# Patient Record
Sex: Male | Born: 1960 | Race: White | Hispanic: No
Health system: Southern US, Community
[De-identification: ages and names within clinical notes are randomized; demographics above are authoritative.]

## PROBLEM LIST (undated history)

## (undated) DIAGNOSIS — I10 Essential (primary) hypertension: Secondary | ICD-10-CM

## (undated) DIAGNOSIS — K219 Gastro-esophageal reflux disease without esophagitis: Secondary | ICD-10-CM

## (undated) DIAGNOSIS — F419 Anxiety disorder, unspecified: Secondary | ICD-10-CM

## (undated) DIAGNOSIS — C189 Malignant neoplasm of colon, unspecified: Secondary | ICD-10-CM

## (undated) DIAGNOSIS — F319 Bipolar disorder, unspecified: Secondary | ICD-10-CM

## (undated) DIAGNOSIS — E119 Type 2 diabetes mellitus without complications: Secondary | ICD-10-CM

## (undated) DIAGNOSIS — F32A Depression, unspecified: Secondary | ICD-10-CM

## (undated) DIAGNOSIS — G4730 Sleep apnea, unspecified: Secondary | ICD-10-CM

## (undated) DIAGNOSIS — C801 Malignant (primary) neoplasm, unspecified: Secondary | ICD-10-CM

## (undated) DIAGNOSIS — S52502A Unspecified fracture of the lower end of left radius, initial encounter for closed fracture: Secondary | ICD-10-CM

## (undated) HISTORY — DX: Essential (primary) hypertension: I10

## (undated) HISTORY — DX: Type 2 diabetes mellitus without complications: E11.9

## (undated) HISTORY — DX: Malignant neoplasm of colon, unspecified: C18.9

---

## 1998-02-24 ENCOUNTER — Emergency Department (HOSPITAL_COMMUNITY): Admission: EM | Admit: 1998-02-24 | Discharge: 1998-02-24 | Payer: Self-pay | Admitting: Emergency Medicine

## 1998-02-24 ENCOUNTER — Encounter: Payer: Self-pay | Admitting: Emergency Medicine

## 2002-03-12 ENCOUNTER — Encounter: Payer: Self-pay | Admitting: Family Medicine

## 2002-03-12 ENCOUNTER — Encounter: Admission: RE | Admit: 2002-03-12 | Discharge: 2002-03-12 | Payer: Self-pay | Admitting: Family Medicine

## 2002-03-22 HISTORY — PX: COLON SURGERY: SHX602

## 2002-04-30 ENCOUNTER — Ambulatory Visit (HOSPITAL_COMMUNITY): Admission: RE | Admit: 2002-04-30 | Discharge: 2002-04-30 | Payer: Self-pay | Admitting: *Deleted

## 2002-04-30 ENCOUNTER — Encounter: Payer: Self-pay | Admitting: *Deleted

## 2002-06-25 ENCOUNTER — Ambulatory Visit (HOSPITAL_COMMUNITY): Admission: RE | Admit: 2002-06-25 | Discharge: 2002-06-25 | Payer: Self-pay | Admitting: Gastroenterology

## 2002-06-25 ENCOUNTER — Encounter: Payer: Self-pay | Admitting: Gastroenterology

## 2002-07-02 ENCOUNTER — Ambulatory Visit (HOSPITAL_COMMUNITY): Admission: RE | Admit: 2002-07-02 | Discharge: 2002-07-02 | Payer: Self-pay | Admitting: Gastroenterology

## 2002-07-02 ENCOUNTER — Encounter: Payer: Self-pay | Admitting: Gastroenterology

## 2002-07-19 ENCOUNTER — Encounter: Payer: Self-pay | Admitting: General Surgery

## 2002-07-24 ENCOUNTER — Encounter (INDEPENDENT_AMBULATORY_CARE_PROVIDER_SITE_OTHER): Payer: Self-pay | Admitting: Specialist

## 2002-07-24 ENCOUNTER — Inpatient Hospital Stay (HOSPITAL_COMMUNITY): Admission: RE | Admit: 2002-07-24 | Discharge: 2002-07-30 | Payer: Self-pay | Admitting: General Surgery

## 2002-09-13 ENCOUNTER — Encounter: Payer: Self-pay | Admitting: Oncology

## 2002-09-13 ENCOUNTER — Ambulatory Visit (HOSPITAL_COMMUNITY): Admission: RE | Admit: 2002-09-13 | Discharge: 2002-09-13 | Payer: Self-pay | Admitting: Oncology

## 2002-09-14 ENCOUNTER — Encounter: Payer: Self-pay | Admitting: General Surgery

## 2002-09-14 ENCOUNTER — Ambulatory Visit (HOSPITAL_COMMUNITY): Admission: RE | Admit: 2002-09-14 | Discharge: 2002-09-14 | Payer: Self-pay | Admitting: General Surgery

## 2002-10-29 ENCOUNTER — Inpatient Hospital Stay (HOSPITAL_COMMUNITY): Admission: EM | Admit: 2002-10-29 | Discharge: 2002-11-03 | Payer: Self-pay | Admitting: Oncology

## 2002-10-29 ENCOUNTER — Encounter: Payer: Self-pay | Admitting: Oncology

## 2003-01-23 ENCOUNTER — Ambulatory Visit (HOSPITAL_COMMUNITY): Admission: RE | Admit: 2003-01-23 | Discharge: 2003-01-23 | Payer: Self-pay | Admitting: Oncology

## 2003-03-03 ENCOUNTER — Observation Stay (HOSPITAL_COMMUNITY): Admission: EM | Admit: 2003-03-03 | Discharge: 2003-03-03 | Payer: Self-pay | Admitting: Emergency Medicine

## 2003-05-17 ENCOUNTER — Ambulatory Visit (HOSPITAL_COMMUNITY): Admission: RE | Admit: 2003-05-17 | Discharge: 2003-05-17 | Payer: Self-pay | Admitting: Oncology

## 2003-06-04 ENCOUNTER — Ambulatory Visit (HOSPITAL_COMMUNITY): Admission: RE | Admit: 2003-06-04 | Discharge: 2003-06-04 | Payer: Self-pay | Admitting: General Surgery

## 2003-11-12 ENCOUNTER — Ambulatory Visit (HOSPITAL_COMMUNITY): Admission: RE | Admit: 2003-11-12 | Discharge: 2003-11-12 | Payer: Self-pay | Admitting: Oncology

## 2004-05-06 ENCOUNTER — Ambulatory Visit: Payer: Self-pay | Admitting: Oncology

## 2004-05-07 ENCOUNTER — Ambulatory Visit (HOSPITAL_COMMUNITY): Admission: RE | Admit: 2004-05-07 | Discharge: 2004-05-07 | Payer: Self-pay | Admitting: Oncology

## 2004-06-28 ENCOUNTER — Ambulatory Visit (HOSPITAL_COMMUNITY): Admission: RE | Admit: 2004-06-28 | Discharge: 2004-06-28 | Payer: Self-pay | Admitting: Oncology

## 2004-10-19 ENCOUNTER — Encounter: Admission: RE | Admit: 2004-10-19 | Discharge: 2004-10-19 | Payer: Self-pay | Admitting: General Surgery

## 2004-11-11 ENCOUNTER — Ambulatory Visit: Payer: Self-pay | Admitting: Oncology

## 2004-11-12 ENCOUNTER — Ambulatory Visit (HOSPITAL_COMMUNITY): Admission: RE | Admit: 2004-11-12 | Discharge: 2004-11-12 | Payer: Self-pay | Admitting: Oncology

## 2005-02-25 ENCOUNTER — Encounter: Admission: RE | Admit: 2005-02-25 | Discharge: 2005-02-25 | Payer: Self-pay | Admitting: Sports Medicine

## 2005-05-21 ENCOUNTER — Ambulatory Visit: Payer: Self-pay | Admitting: Oncology

## 2005-07-23 ENCOUNTER — Ambulatory Visit: Payer: Self-pay | Admitting: Gastroenterology

## 2005-08-03 ENCOUNTER — Encounter (INDEPENDENT_AMBULATORY_CARE_PROVIDER_SITE_OTHER): Payer: Self-pay | Admitting: Specialist

## 2005-08-03 ENCOUNTER — Ambulatory Visit: Payer: Self-pay | Admitting: Gastroenterology

## 2005-11-26 ENCOUNTER — Ambulatory Visit: Payer: Self-pay | Admitting: Oncology

## 2005-11-26 ENCOUNTER — Ambulatory Visit (HOSPITAL_COMMUNITY): Admission: RE | Admit: 2005-11-26 | Discharge: 2005-11-26 | Payer: Self-pay | Admitting: Oncology

## 2005-11-26 LAB — CBC WITH DIFFERENTIAL/PLATELET
BASO%: 0.8 % (ref 0.0–2.0)
HCT: 47.4 % (ref 38.7–49.9)
LYMPH%: 24.7 % (ref 14.0–48.0)
MCHC: 35 g/dL (ref 32.0–35.9)
MCV: 94.3 fL (ref 81.6–98.0)
MONO#: 0.4 10*3/uL (ref 0.1–0.9)
MONO%: 4.7 % (ref 0.0–13.0)
NEUT%: 66.7 % (ref 40.0–75.0)
Platelets: 188 10*3/uL (ref 145–400)
RBC: 5.02 10*6/uL (ref 4.20–5.71)

## 2005-11-26 LAB — COMPREHENSIVE METABOLIC PANEL
ALT: 31 U/L (ref 0–40)
Alkaline Phosphatase: 76 U/L (ref 39–117)
CO2: 25 mEq/L (ref 19–32)
Creatinine, Ser: 1.07 mg/dL (ref 0.40–1.50)
Glucose, Bld: 101 mg/dL — ABNORMAL HIGH (ref 70–99)
Sodium: 141 mEq/L (ref 135–145)
Total Bilirubin: 0.6 mg/dL (ref 0.3–1.2)
Total Protein: 7.3 g/dL (ref 6.0–8.3)

## 2005-11-26 LAB — LACTATE DEHYDROGENASE: LDH: 144 U/L (ref 94–250)

## 2005-11-26 LAB — CEA: CEA: 2.6 ng/mL (ref 0.0–5.0)

## 2006-05-17 ENCOUNTER — Ambulatory Visit: Payer: Self-pay | Admitting: Oncology

## 2006-06-29 ENCOUNTER — Ambulatory Visit: Payer: Self-pay | Admitting: Oncology

## 2006-07-01 ENCOUNTER — Ambulatory Visit (HOSPITAL_COMMUNITY): Admission: RE | Admit: 2006-07-01 | Discharge: 2006-07-01 | Payer: Self-pay | Admitting: Oncology

## 2006-07-01 LAB — COMPREHENSIVE METABOLIC PANEL
AST: 19 U/L (ref 0–37)
Albumin: 4.6 g/dL (ref 3.5–5.2)
BUN: 10 mg/dL (ref 6–23)
CO2: 24 mEq/L (ref 19–32)
Calcium: 9.6 mg/dL (ref 8.4–10.5)
Chloride: 105 mEq/L (ref 96–112)
Creatinine, Ser: 1.07 mg/dL (ref 0.40–1.50)
Glucose, Bld: 99 mg/dL (ref 70–99)
Potassium: 4.3 mEq/L (ref 3.5–5.3)

## 2006-07-01 LAB — CBC WITH DIFFERENTIAL/PLATELET
Basophils Absolute: 0.1 10*3/uL (ref 0.0–0.1)
Eosinophils Absolute: 0.2 10*3/uL (ref 0.0–0.5)
HCT: 46.7 % (ref 38.7–49.9)
HGB: 16.6 g/dL (ref 13.0–17.1)
MONO#: 0.4 10*3/uL (ref 0.1–0.9)
NEUT#: 4.5 10*3/uL (ref 1.5–6.5)
NEUT%: 61.8 % (ref 40.0–75.0)
RDW: 12.6 % (ref 11.2–14.6)
lymph#: 2.1 10*3/uL (ref 0.9–3.3)

## 2006-07-01 LAB — CEA: CEA: 1.9 ng/mL (ref 0.0–5.0)

## 2006-07-01 LAB — LACTATE DEHYDROGENASE: LDH: 152 U/L (ref 94–250)

## 2006-12-21 ENCOUNTER — Ambulatory Visit: Payer: Self-pay | Admitting: Oncology

## 2006-12-26 ENCOUNTER — Ambulatory Visit (HOSPITAL_COMMUNITY): Admission: RE | Admit: 2006-12-26 | Discharge: 2006-12-26 | Payer: Self-pay | Admitting: Oncology

## 2006-12-26 LAB — COMPREHENSIVE METABOLIC PANEL WITH GFR
ALT: 43 U/L (ref 0–53)
AST: 27 U/L (ref 0–37)
Albumin: 4 g/dL (ref 3.5–5.2)
Alkaline Phosphatase: 86 U/L (ref 39–117)
BUN: 8 mg/dL (ref 6–23)
CO2: 27 meq/L (ref 19–32)
Calcium: 9.5 mg/dL (ref 8.4–10.5)
Chloride: 104 meq/L (ref 96–112)
Creatinine, Ser: 0.98 mg/dL (ref 0.40–1.50)
Glucose, Bld: 94 mg/dL (ref 70–99)
Potassium: 4.2 meq/L (ref 3.5–5.3)
Sodium: 139 meq/L (ref 135–145)
Total Bilirubin: 0.6 mg/dL (ref 0.3–1.2)
Total Protein: 6.8 g/dL (ref 6.0–8.3)

## 2006-12-26 LAB — CBC WITH DIFFERENTIAL/PLATELET
BASO%: 1.1 % (ref 0.0–2.0)
Basophils Absolute: 0.1 10*3/uL (ref 0.0–0.1)
EOS%: 3.1 % (ref 0.0–7.0)
Eosinophils Absolute: 0.2 10*3/uL (ref 0.0–0.5)
HCT: 45.1 % (ref 38.7–49.9)
HGB: 16.3 g/dL (ref 13.0–17.1)
LYMPH%: 32.6 % (ref 14.0–48.0)
MCH: 34 pg — ABNORMAL HIGH (ref 28.0–33.4)
MCHC: 36.1 g/dL — ABNORMAL HIGH (ref 32.0–35.9)
MCV: 94.2 fL (ref 81.6–98.0)
MONO#: 0.5 10*3/uL (ref 0.1–0.9)
MONO%: 7.1 % (ref 0.0–13.0)
NEUT#: 3.6 10*3/uL (ref 1.5–6.5)
NEUT%: 56.1 % (ref 40.0–75.0)
Platelets: 184 10*3/uL (ref 145–400)
RBC: 4.79 10*6/uL (ref 4.20–5.71)
RDW: 12.7 % (ref 11.2–14.6)
WBC: 6.4 10*3/uL (ref 4.0–10.0)
lymph#: 2.1 10*3/uL (ref 0.9–3.3)

## 2006-12-26 LAB — LACTATE DEHYDROGENASE: LDH: 137 U/L (ref 94–250)

## 2008-04-10 ENCOUNTER — Ambulatory Visit: Payer: Self-pay | Admitting: Oncology

## 2008-06-27 ENCOUNTER — Encounter (INDEPENDENT_AMBULATORY_CARE_PROVIDER_SITE_OTHER): Payer: Self-pay | Admitting: *Deleted

## 2009-02-17 ENCOUNTER — Encounter (INDEPENDENT_AMBULATORY_CARE_PROVIDER_SITE_OTHER): Payer: Self-pay | Admitting: *Deleted

## 2009-03-22 HISTORY — PX: COLONOSCOPY: SHX174

## 2009-04-01 ENCOUNTER — Encounter (INDEPENDENT_AMBULATORY_CARE_PROVIDER_SITE_OTHER): Payer: Self-pay | Admitting: *Deleted

## 2009-04-02 ENCOUNTER — Ambulatory Visit: Payer: Self-pay | Admitting: Gastroenterology

## 2009-04-09 ENCOUNTER — Ambulatory Visit: Payer: Self-pay | Admitting: Gastroenterology

## 2009-04-10 ENCOUNTER — Encounter: Payer: Self-pay | Admitting: Gastroenterology

## 2010-03-22 DIAGNOSIS — C801 Malignant (primary) neoplasm, unspecified: Secondary | ICD-10-CM

## 2010-03-22 HISTORY — PX: COLON SURGERY: SHX602

## 2010-03-22 HISTORY — DX: Malignant (primary) neoplasm, unspecified: C80.1

## 2010-04-12 ENCOUNTER — Encounter: Payer: Self-pay | Admitting: Oncology

## 2010-04-21 NOTE — Procedures (Signed)
Summary: Colonoscopy  Patient: [name] Note: All result statuses are Final unless otherwise noted.  Tests: (1) Colonoscopy (COL)   COL Colonoscopy           DONE     [facility]     [address]     [place], [place]  [address]           COLONOSCOPY PROCEDURE REPORT           PATIENT:  Scott, Day  MR#:  [id number]     BIRTHDATE:  01-24-1961, 48 yrs. old  GENDER:  male           ENDOSCOPIST:  Judie Petit T. Russella Dar, MD, [facility]           PROCEDURE DATE:  04/09/2009     PROCEDURE:  Colonoscopy with biopsy     ASA CLASS:  Class II     INDICATIONS:  1) follow-up of cancer, T3 N0 colon adenocarcinoma,     06/2002.           MEDICATIONS:   Fentanyl 75 mcg IV, Versed 7 mg IV           DESCRIPTION OF PROCEDURE:   After the risks benefits and     alternatives of the procedure were thoroughly explained, informed     consent was obtained.  Digital rectal exam was performed and     revealed no abnormalities.   The LB PCF-H180AL [id number] endoscope     was introduced through the anus and advanced to the terminal ileum     which was intubated for a short distance, without limitations.     The quality of the prep was excellent, using MoviPrep.  The     instrument was then slowly withdrawn as the colon was fully     examined.     <<PROCEDUREIMAGES>>           FINDINGS:  Five polyps were found in the rectum. They were 2 - 3     mm in size. The polyps were removed using cold biopsy forceps.     Prior right hemicolectomy and a normal appearing anastomosis was     noted. The ascending, hepatic flexure, transverse, splenic     flexure, descending, sigmoid colon appeared unremarkable.  The     neoterminal ileum appeared normal.   Retroflexed views in the     rectum revealed internal hemorrhoids, small.  The time to cecum =     1.5  minutes. The scope was then withdrawn (time =  8  min) from     the patient and the procedure completed.           COMPLICATIONS:  None        ENDOSCOPIC IMPRESSION:     1) 2 - 3 mm, five polyps in the rectum     2) Internal hemorrhoids     3) Prior right hemicolectomy           RECOMMENDATIONS:     1) Await pathology results     2) Repeat Colonoscopy in 3 years.           Venita Lick. Russella Dar, MD, [facility]           n.     eSIGNED:   Venita Lick. Lachae Hohler at 04/09/2009 08:59 AM           Scott, Day [id number]  Note: An exclamation mark (!) indicates a result that was not dispersed into the  flowsheet. Document Creation Date: 04/09/2009 8:58 AM _______________________________________________________________________  (1) Order result status: Final Collection or observation date-time: 04/09/2009 08:54 Requested date-time:  Receipt date-time:  Reported date-time:  Referring Physician:   Ordering Physician: Claudette Head [phone]) Specimen Source:  Source: Launa Grill Order Number: [id number] Lab site:   Appended Document: Colonoscopy     Procedures Next Due Date:    Colonoscopy: 03/2012

## 2010-04-21 NOTE — Miscellaneous (Signed)
Summary: RECALL COLON 04/09/08--SP  Clinical Lists Changes  Medications: Added new medication of MOVIPREP 100 GM  SOLR (PEG-KCL-NACL-NASULF-NA ASC-C) As per prep instructions. - Signed Rx of MOVIPREP 100 GM  SOLR (PEG-KCL-NACL-NASULF-NA ASC-C) As per prep instructions.;  #1 x 0;  Signed;  Entered by: Jennye Boroughs RN;  Authorized by: Meryl Dare MD [facility];  Method used: Electronically to [facility]  [address] [phone]*, [address] [place], [place]  [address], Ph: [phone] or [phone], Fax: [phone]    Prescriptions: MOVIPREP 100 GM  SOLR (PEG-KCL-NACL-NASULF-NA ASC-C) As per prep instructions.  #1 x 0   Entered by:   Jennye Boroughs RN   Authorized by:   Meryl Dare MD [facility]   Signed by:   Jennye Boroughs RN on 04/02/2009   Method used:   [facility]ronically to        [facility]  [address] [phone]* (retail)       [address]       [place], [place]  [address]       Ph: [phone] or [phone]       Fax: [phone]   RxID:   [id number]

## 2010-04-21 NOTE — Letter (Signed)
Summary: [facility] Instructions  [facility]  [address] [place], [place] [address]   Phone: [phone]  Fax: [phone]       Scott Day    09-27-1960    MRN: [id number]        Procedure Day Dorna Bloom:  Wed. 01/19     Arrival Time:  7:30am     Procedure Time:  8:30am     Location of Procedure:                    _ X_  [facility] (4th Floor)                        PREPARATION FOR COLONOSCOPY WITH MOVIPREP   Starting 5 days prior to your procedure   Fri. 01/14  do not eat nuts, seeds, popcorn, corn, beans, peas,  salads, or any raw vegetables.  Do not take any fiber supplements (e.g. Metamucil, Citrucel, and Benefiber).  THE DAY BEFORE YOUR PROCEDURE         DATE:  01/18   DAY:  Rica Mote.  1.  Drink clear liquids the entire day-NO SOLID FOOD  2.  Do not drink anything colored red or purple.  Avoid juices with pulp.  No orange juice.  3.  Drink at least 64 oz. (8 glasses) of fluid/clear liquids during the day to prevent dehydration and help the prep work efficiently.  CLEAR LIQUIDS INCLUDE: Water Jello Ice Popsicles Tea (sugar ok, no milk/cream) Powdered fruit flavored drinks Coffee (sugar ok, no milk/cream) Gatorade Juice: apple, white grape, white cranberry  Lemonade Clear bullion, consomm, broth Carbonated beverages (any kind) Strained chicken noodle soup Hard Candy                             4.  In the morning, mix first dose of MoviPrep solution:    Empty 1 Pouch A and 1 Pouch B into the disposable container    Add lukewarm drinking water to the top line of the container. Mix to dissolve    Refrigerate (mixed solution should be used within 24 hrs)  5.  Begin drinking the prep at 5:00 p.m. The MoviPrep container is divided by 4 marks.   Every 15 minutes drink the solution down to the next mark (approximately 8 oz) until the full liter is complete.   6.  Follow completed prep with 16 oz of clear liquid of your choice (Nothing  red or purple).  Continue to drink clear liquids until bedtime.  7.  Before going to bed, mix second dose of MoviPrep solution:    Empty 1 Pouch A and 1 Pouch B into the disposable container    Add lukewarm drinking water to the top line of the container. Mix to dissolve    Refrigerate  THE DAY OF YOUR PROCEDURE      DATE:  01/19  DAY:  Wed.  Beginning at  3:30 a.m. (5 hours before procedure):         1. Every 15 minutes, drink the solution down to the next mark (approx 8 oz) until the full liter is complete.  2. Follow completed prep with 16 oz. of clear liquid of your choice.    3. You may drink clear liquids until  6:30am  (2 HOURS BEFORE PROCEDURE).   MEDICATION INSTRUCTIONS  Unless otherwise instructed, you should take regular prescription medications with a  small sip of water   as early as possible the morning of your procedure.  Diabetic patients - see separate instructions.  Stop taking Plavix or Aggrenox on  _  _  (7 days before procedure).     Stop taking Coumadin on  _ _  (5 days before procedure).  Additional medication instructions: _         OTHER INSTRUCTIONS  You will need a responsible adult at least 50 years of age to accompany you and drive you home.   This person must remain in the waiting room during your procedure.  Wear loose fitting clothing that is easily removed.  Leave jewelry and other valuables at home.  However, you may wish to bring a book to read or  an iPod/MP3 player to listen to music as you wait for your procedure to start.  Remove all body piercing jewelry and leave at home.  Total time from sign-in until discharge is approximately 2-3 hours.  You should go home directly after your procedure and rest.  You can resume normal activities the  day after your procedure.  The day of your procedure you should not:   Drive   Make legal decisions   Operate machinery   Drink alcohol   Return to work  You will receive  specific instructions about eating, activities and medications before you leave.    The above instructions have been reviewed and explained to me by   Jennye Boroughs RN  April 02, 2009 8:34 AM     I fully understand and can verbalize these instructions _____________________________ Date _________

## 2010-04-21 NOTE — Letter (Signed)
Summary: Patient Notice-Hyperplastic Polyps  [facility]  [address] [place], [place] [address]   Phone: [phone]  Fax: [phone]        April 10, 2009 MRN: [id number]    Scott Day [address] [place], [place]  [address]    Dear [name],  I am pleased to inform you that the colon polyp(s) removed during your recent colonoscopy was (were) found to be hyperplastic. These types of polyps are NOT pre-cancerous.  It is my recommendation that you have a repeat colonoscopy examination in 3 years for routine colorectal cancer screening.  Should you develop new or worsening symptoms of abdominal pain, bowel habit changes or bleeding from the rectum or bowels, please schedule an evaluation with either your primary care physician or with me.  Continue treatment plan as outlined the day of your exam.  Please call us if you are having persistent problems or have questions about your condition that have not been fully answered at this time.  Sincerely,  Meryl Dare MD [facility]  This letter has been electronically signed by your physician.  Appended Document: Patient Notice-Hyperplastic Polyps Letter mailed 1.21.11

## 2010-08-07 NOTE — Discharge Summary (Signed)
   NAMELENON, KUENNEN NO.:  [id number]   MEDICAL RECORD NO.:  [id number]                   PATIENT TYPE:  INP   LOCATION:  0367                                 FACILITY:  [facility]   PHYSICIAN:  Timothy E. Earlene Plater, M.D.              DATE OF BIRTH:  1960-05-11   DATE OF ADMISSION:  07/24/2002  DATE OF DISCHARGE:  07/30/2002                                 DISCHARGE SUMMARY   FINAL DIAGNOSES:  Adenocarcinoma right colon T3 M0.   HISTORY OF PRESENT ILLNESS:  The patient was seen and evaluated by Dr.  Corinda Gubler with a CT scan and colonoscopy.  CT scan revealed a mass in the  cecum.  Colonoscopy revealed a near obstructing adenocarcinoma.  The patient  was seen urgently, scheduled urgently, and prepared for surgery.  His  admission chest x-ray cardiogram was completely normal.  His laboratory data  showed a perfectly normal CBC, chemistry profile, CEA level 1.5.  His  surgery was carried out on May 4 and he had a smooth, completely  uncomplicated postoperative recovery.  Foley catheter removed.  Diet  started.  Ambulation was full.  GI function began and resumed normal.  He is  ready for discharge on May 10.  Wound is primary and without complications.  The patient is afebrile.  Percocet is given for pain and careful diet is  advised.  He will be seen and followed as an outpatient and oncology will be  consulted.  Final pathology report T3 M0.                                               Timothy E. Earlene Plater, M.D.    TED/MEDQ  D:  07/30/2002  T:  07/30/2002  Job:  [id number]   cc:   Ulyess Mort, M.D. [facility]

## 2010-08-07 NOTE — Op Note (Signed)
NAMEJAMALL, STROHMEIER NO.:  [id number]   MEDICAL RECORD NO.:  [id number]                   PATIENT TYPE:  INP   LOCATION:  0347                                 FACILITY:  [facility]   PHYSICIAN:  Timothy E. Earlene Plater, M.D.              DATE OF BIRTH:  15-Sep-1960   DATE OF PROCEDURE:  07/24/2002  DATE OF DISCHARGE:                                 OPERATIVE REPORT   PREOPERATIVE DIAGNOSIS:  Right colon cancer.   POSTOPERATIVE DIAGNOSIS:  Right colon cancer.   PROCEDURE:  Right colectomy.   SURGEON:  Timothy E. Earlene Plater, M.D.   ASSISTANT:  Sandria Bales. Ezzard Standing, M.D.   ANESTHESIA:  CRNA supervised, Dr. Almeta Monas.   HISTORY:  Please see the enclosed notes. Mr. Reali has been recently worked  up by Dr. Victorino Dike including CT and colonoscopy with the findings of  adenocarcinoma, a large bulky lesion in the cecum. CEA level today is 1.5,  CBC and chemistry profile were normal. The patient's been carefully informed  and is anxious to proceed with this surgery. He was prepared at home,  brought in, IV started, IV fluids given, evaluated by anesthesia, identified  and a permit signed.   DESCRIPTION OF PROCEDURE:  The patient was taken to the operating room,  placed supine, general endotracheal anesthesia administered. The abdomen had  been shaved, it was prepped and draped, a Foley catheter inserted. PAS hose  were placed. With some difficulty, an oral gastric tube was placed. A  midline incision was used in the mid abdomen. The abdomen was entered,  careful exploration revealed only a large bulky tumor in the cecum without  obvious metastases. The liver was free of disease, the small bowel and  remainder of the colon was felt to be normal. We delineated the limits of  resection being the terminal ileum and the proximal transverse colon. These  areas were carefully dissected out by dividing the peritoneum laterally with  the right colon and delivering the right colon  into the midline wound. This  was done with cautery. At the areas of forward resection, the bowel was  divided across separately with the GIA staple device and then the mesentery  of the affected bowel was then divided carefully between clamps and the  specimen removed off the field. The other structures i.e. the duodenum,  right kidney, right ureter were all kept well posterior to the dissection.  The tumor was sent to the lab fresh for their evaluation. The remaining  small bowel fit nicely, it was laid side by side against the transverse  colon and then using the GIA staple device an anastomosis was created. The  puncture sites were carefully evaluated, held together and then closed with  a TA-60 staple device. There was one small skipped area in the staple line  that was closed carefully with interrupted silks. The remainder of the  anastomosis was  carefully inspected and was intact. The mesentery was closed  with interrupted silk sutures. The new anastomosis lay nicely in the right  upper quadrant. All areas were inspected, there was no bleeding or  complications. Irrigation was clear. Blood loss was minimal. The small bowel  was allowed to lay across the mid abdomen and omentum was pulled down over  this and with the counts correct, the midline fascia was closed with the  exception of a small epigastric hernia which the entrapped fat was removed  and  then the fascia was closed in the normal routine way. The subcu was  copiously irrigated and skin closed with wide skin staples. Final counts  correct. The patient tolerated the procedure well and was awakened and taken  to the recovery room in good condition.                                               Timothy E. Earlene Plater, M.D.    TED/MEDQ  D:  07/24/2002  T:  07/24/2002  Job:  [id number]   cc:   Ulyess Mort, M.D. [facility]

## 2010-08-07 NOTE — Op Note (Signed)
[name], [name]                          ACCOUNT NO.:  [id number]   MEDICAL RECORD NO.:  [id number]                   PATIENT TYPE:  AMB   LOCATION:  DAY                                  FACILITY:  [facility]   PHYSICIAN:  Timothy E. Earlene Plater, M.D.              DATE OF BIRTH:  Apr 23, 1960   DATE OF PROCEDURE:  09/14/2002  DATE OF DISCHARGE:                                 OPERATIVE REPORT   PREOPERATIVE DIAGNOSIS:  Cancer of the colon for chemotherapy.   POSTOPERATIVE DIAGNOSIS:  Cancer of the colon for chemotherapy.   PROCEDURE:  Insertion of Port-A-Cath.   SURGEON:  Timothy E. Earlene Plater, M.D.   ANESTHESIA:  Local.   INDICATION:  [name] is a very healthy 50 year old with a recent  diagnosis of T3 N0 carcinoma of the right colon and has elected to take  adjuvant chemotherapy.  For this, he will need a Port-A-Cath.  He agrees and  understands.   DESCRIPTION OF PROCEDURE:  The patient taken to the operating room, placed  supine, carefully positioned, arms tucked, mid back elevated, in the head-  down position.  The entire upper chest and neck were prepped with Betadine.  We had hoped to approach the left side as preferential.  IV sedation given,  local anesthesia used throughout.  The left subclavian vein was isolated on  the second pass without complication.  The wire placed through the  introduction needle and throughout using real-time fluoroscopy.  The wire  was ascertained to be in the correct position, the insertion needle removed,  and then the introducer placed over the guidewire, ascertained to be in the  correct position, guidewire removed, and then the irrigated catheter of the  Port-A-Cath device was placed through the introducer and the introducer  removed.  Careful positioning of the Port-A-Cath was satisfactory.  And then  using additional local, a pocket was made in the upper left chest for the  port device.  The catheter was passed subcutaneously, again  ascertained to  be in good position; it irrigated and flushed readily.  It was connected, of  course, and locked in position to the port.  The port was then sewn to the  prepectoral fascia with three sutures of 2-0 Prolene.  And the wound was  closed in layers with 3-0 Monocryl.  The skin was carefully marked, Steri-  Strips applied, and a Tegaderm applied and then the access needle flushed  with concentrated heparin and correctly placed  into the port device.  Again, it flushed and irrigated well.  When this  procedure was complete, counts were correct.  He had tolerated it well and  was removed to the recovery room.  A portable chest x-ray will be made for  completeness, and he will be followed as an outpatient.  Timothy E. Earlene Plater, M.D.    TED/MEDQ  D:  09/14/2002  T:  09/14/2002  Job:  [id number]   cc:   Genene Churn. Cyndie Chime, M.D.  [address] [facility]  [place]  [place] [address]  Fax: [phone]   Ulyess Mort, M.D. [facility]

## 2010-08-07 NOTE — Discharge Summary (Signed)
NAMESUNG, Scott Day NO.:  [id number]   MEDICAL RECORD NO.:  [id number]                   PATIENT TYPE:  INP   LOCATION:  1610                                 FACILITY:  [facility]   PHYSICIAN:  Scott Day, M.D.                   DATE OF BIRTH:  06/08/1960   DATE OF ADMISSION:  10/29/2002  DATE OF DISCHARGE:  11/03/2002                                 DISCHARGE SUMMARY   DISCHARGE DIAGNOSES:  1. Stage II colon cancer on adjuvant chemotherapy.  2. History of admission for enteritis.   HISTORY OF PRESENT ILLNESS:  [name] is a 50 year old gentleman with a  known history of T3, N0 colon cancer who is receiving FOL/FOX chemotherapy,  status post four cycles.  The patient was admitted with right lower quadrant  pain and diarrhea.   PHYSICAL EXAMINATION:  GENERAL APPEARANCE:  On initial evaluation, the blood  pressure was 122/77, temperature 97.2 degrees, pulse 78, and respiratory  rate 20.  WEIGHT 197 pounds.  LUNGS:  Clear.  CARDIAC:  Unremarkable.  ABDOMEN:  He had right lower quadrant pain.   LABORATORY DATA:  The white count initially was 1.68, ANC 400, hemoglobin  12.4, and platelet count 91,000.   HOSPITAL COURSE:  The patient was admitted to the hospital.  He was started  on IV fluids and was started on Neupogen 40 mcg subcutaneously daily and had  a CT of the abdomen on October 29, 2002.  This essentially showed some  regional inflammation around the anastomotic site.  No evidence of  obstruction was seen.  He did have pain in the hospital and was started on  antibiotics on October 31, 2002.  He did not have any fevers.  The white  count remained stable.  The white count improved.  On November 01, 2002, the  white count had risen to 3.4 with an ANC of 1000.  Eventually the pain had  resolved.  He had no other complications while in the hospital.  On November 03, 2002, his white count was 21 with a hemoglobin of 12.6 and a platelet  count of  184.  He was felt to be ready for discharge.   DISPOSITION:  He was discharged in stable condition.   DISCHARGE MEDICATIONS:  1. Imodium one to two p.r.n. with loose bowel movements.  2. Valium 5 mg p.r.n.  3. Ambien at h.s. 10 mg.  4. Coumadin 1 mg a day.    FOLLOWUP:  He was asked to call our office to reschedule his chemotherapy in  a week's time.   PROGNOSIS:  Good.                                               Scott Day,  M.D.    PR/MEDQ  D:  11/03/2002  T:  11/03/2002  Job:  [id number]   cc:   Scott Day, M.D.  [address] [place]  [place] [address]  Fax: [phone]

## 2010-08-07 NOTE — H&P (Signed)
Scott Day, Scott Day                          ACCOUNT NO.:  [id number]   MEDICAL RECORD NO.:  [id number]                   PATIENT TYPE:  INP   LOCATION:  0450                                 FACILITY:  [facility]   PHYSICIAN:  Lennis P. Darrold Span, M.D.             DATE OF BIRTH:  06/14/1960   DATE OF ADMISSION:  03/02/2003  DATE OF DISCHARGE:  03/03/2003                                HISTORY & PHYSICAL   REASON FOR ADMISSION:  The patient is a 50 year old white male patient of  Pierce Crane, M.D., and also followed by Sheppard Plumber. Earlene Plater, M.D. 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  His  last chemotherapy was at [facility] on December 7th, with more  nausea and difficulty with that treatment than previously which he believes  was due to some initial error in the rate of the infusion which was  corrected.  He did receive Neulasta at our office on December 9th.  His  bowels had moved normally on December 10th.  He was having some vague mid  abdominal discomfort which is not unusual for him following chemotherapy.  This for several days after the treatment last week and he took several  doses of Maalox.  He denies any preexisting epigastric discomfort or reflux  type symptoms and does not have any history of known gastritis or peptic  ulcer disease.  He is on, I believe, low-dose Coumadin for the Port-A-Cath  though this is not listed on his admission meds.  Since arrival at the  hospital about 8 p.m. to the emergency room on December 11th, he has not had  further bowel movements.   REVIEW OF SYSTEMS:  No fever or other localizing symptoms of infection.  He  is hungry this morning since he has been NPO.  No other noted bleeding.  His  nausea has been controlled with medications since his  chemotherapy.   PAST MEDICAL HISTORY:  No known allergies per the last H&P.  Post right  hemicolectomy by Sheppard Plumber. Earlene Plater, M.D. in May 2004.  A Port-A-Cath in  place.   FAMILY HISTORY:  Per the old records.   SOCIAL HISTORY:  Married.  Lives with his wife in [place].  He continues  to smoke 1-1/2 packs daily which we have discussed.  He tells me he is  interested in stopping smoking.  Per the old records, past alcohol abuse.  He does not have a primary M.D.   PHYSICAL EXAMINATION:  GENERAL:  He is awake, alert, and looks comfortable  lying supine in bed, he is asking for a sausage biscuit and to smoke.  His  wife is present and very supportive and there is a child here also.  VITAL SIGNS:  Temperature 97.2, heart rate 75 and regular, respirations not  labored, blood pressure 113/70.  Room air saturation 95%.  HEENT:  His mouth is clear and moist.  CHEST:  The port is effused without difficulty and the site is not  remarkable.  LUNGS:  Without wheezes or rales.  ABDOMEN:  Soft, nontender.  A few bowel sounds present.  Not distended.  EXTREMITIES:  Lower extremity no edema or cords.  NEUROLOGICAL:  Nonfocal.  SKIN:  Without rashes.   LABORATORY DATA:  Include a CBC done December 11th at 2230.  Hemoglobin  14.7, white count 33.9, platelets 149 and from 0600 December 12th hemoglobin  13.7, white count 29.5 and platelets 95,000.  Sodium 140, potassium 3.4,  chloride 112, CO2 28, glucose 79, BUN 15, creatinine 0.9.   He is not had x-rays since coming in through the emergency room.  Last CT  abdomen and pelvis was January 23, 2003.   IMPRESSION AND PLAN:  1. Black diarrhea three episodes prior to admission.  He clinically seems     to be better.  Etiology not entirely clear.  We will continue Nexium, IV     fluids, supplemental potassium, increase p.o.'s as tolerated, will heme     check any stool and will check his prothrombin time and repeat CBC in the     morning.  2. Colon  carcinoma on adjuvant FOLFOX chemotherapy.  3. Tobacco abuse and we will use nicotine patches in the hospital and     hopefully we will continue to encourage him to discontinue this.  4. Port-A-Cath in place.  5. Elevated white count appears secondary to his Neulasta.  No obvious     infection.  6. Thrombocytopenia this morning probably related to his chemotherapy and     will follow.                                               Lennis P. Darrold Span, M.D.    LPL/MEDQ  D:  03/03/2003  T:  03/03/2003  Job:  [id number]   cc:   Pierce Crane, M.D.  [address] [facility]  [place]  [place] [address]  Fax: [phone]   Sheppard Plumber. Earlene Plater, M.D.  [address]  [place]  [place] [address]  Fax: [phone]   Ulyess Mort, M.D. [facility]

## 2010-08-07 NOTE — H&P (Signed)
NAME:  HOBSON, LAX NO.:  [id number]   MEDICAL RECORD NO.:  [id number]                   PATIENT TYPE:  INP   LOCATION:  6578                                 FACILITY:  [place]   PHYSICIAN:  Pierce Crane, M.D.                   DATE OF BIRTH:  June 21, 1960   DATE OF ADMISSION:  10/29/2002  DATE OF DISCHARGE:                                HISTORY & PHYSICAL   HISTORY OF PRESENT ILLNESS:  Mr. Feagans is a 50 year old [place]  gentleman who is being admitted to [facility] today after he  presented to our office tentatively to initiate day one cycle IV full Fox IV  regimen for colon carcinoma.  He was noted to have had acute onset of right  mid to lower quadrant pain approximately five days ago.  He states that he  has also had fairly persistent diarrheal stools since his last treatment;  though they have not been frequent, they have been approximately two per  day.  He denied any significant change of bowel color (i.e. specifically  hematochezia or melenic stools).  He denies any nausea.  He is currently  recovering from fairly significant oral mucositis.  He wonders whether this  could have been secondary to his 5-FU bolus pushed on October 17, 2002.  No  fevers, chills, or night sweats.  No shortness of breath or chest pain  noted.  He denies any diffuse bone pain.  He has had about a 14 pound weight  loss over the past two weeks.  Energy level is definitely an issue.  He has  been working full time at [employer], and admits that he is probably  lifting more than 40 pounds on an intermittent basis.   The patient is being admitted today for IV fluid hydration, assessment of  abdominal pain including acute abdomen series, full liquid diet, and to  initiate Neupogen for afebrile neutropenia.   PAST MEDICAL HISTORY:  History of tobacco use and remote history of alcohol  abuse.  Otherwise benign.   PAST SURGICAL HISTORY:  Status post  Infusaport placement, right  hemicolectomy, and appendectomy.   ALLERGIES:  No known drug allergies.   CURRENT MEDICATIONS:  1. Chemotherapy agents via full Fox IV regimen.  2. Compazine 10 mg p.o. q.6-8h. p.r.n. nausea.  3. Ambien 10 mg one p.o. q.6h. p.r.n. sleep.  4. Imodium AD p.r.n.   SOCIAL HISTORY:  Mr. Nordling and his wife reside in [place], [place].  Again, he works at [employer] in [employer].  He  has been trying to work full time.  History of tobacco use, smoking  approximately two packs of cigarettes per day, though he is actively trying  to decrease.  Remote history of alcohol abuse.   FAMILY HISTORY:  Noncontributory.   REVIEW OF SYSTEMS:  As per HPI.  The patient denies  any recent headaches,  but he has appreciated a little bit of a visual change, which has been slow,  and since initiation of his first cycle of full Fox given on September 17, 2002.  GI symptoms are per HPI.  He denies any new musculoskeletal problems.  No  genitourinary or neurologic problems.   PHYSICAL EXAMINATION:  VITAL SIGNS:  Blood pressure 122/77, pulse 78,  respirations 20, temperature 97.2, weight 197 pounds (14 pounds weight loss  from October 15, 2002).  GENERAL:  A well-developed, well-nourished, actually fairly health-  appearing, middle-aged white gentleman in no acute distress, but he does  appear quite fatigued.  HEENT:  Conjunctivae and sclerae are a bit injected.  Sclerae are anicteric.  Oropharynx shows approximately a grade I mucositis at this time.  No frank  evidence of oral candidiasis.  No cervical or supraclavicular  lymphadenopathy.  LUNGS:  Clear to auscultation.  No wheezing or rhonchi.  HEART:  Regular rate and rhythm without murmurs, rubs, gallops, or clicks.  CHEST:  Anterior chest wall Infusaport site is benign without significant  erythema or tenderness.  ABDOMEN:  Soft in the upper quadrants.  There is tenderness though in the  right mid to  lower quadrant lateral to the umbilicus.  Normal bowel sounds.  EXTREMITIES:  Benign.   HGB 12.4 gm, PLT 91,000, WBC 1600, with an ANC of 400.   IMPRESSION / RECOMMENDATIONS:  1. Abdominal pain/diarrhea.  The patient is admitted to [facility]     today for assessment of this process.  We would be concerned about     possible GI irritation from his recent chemotherapy, though we need to     rule out an obstructive process.  An acute abdomen serial will be     obtained.  He will be supported with IV fluids and a full liquid diet.  2. Afebrile neutropenia.  The patient will not be started on prophylactic     antibiotics, but we will start Neupogen 480 mcg subcu daily, with     standing orders for temperature greater than 100.0, to obtain stat blood     cultures x2, one peripheral, one through port, and stat urinalysis with     covering M.D. being contacted.  3. Colon carcinoma.  He was due to initiate day one cycle IV full Fox IV.     Obviously, this has been delayed.  It can be resumed upon hospital     discharge.     Amada Kingfisher, P.A.                Pierce Crane, M.D.    CTS/MEDQ  D:  10/29/2002  T:  10/29/2002  Job:  [id number]

## 2010-08-07 NOTE — Op Note (Signed)
NAME:  Scott Day, Scott Day                          ACCOUNT NO.:  [id number]   MEDICAL RECORD NO.:  [id number]                   PATIENT TYPE:  AMB   LOCATION:  DAY                                  [facility]ACILITY[facility]  [facility]   PHYSICIAN:  Timothy E. Earlene Plater, M.D.              DATE OF BIRTH:  September 17, 1960   DATE OF PROCEDURE:  06/04/2003  DATE OF DISCHARGE:                                 OPERATIVE REPORT   PREOPERATIVE DIAGNOSES:  Colon cancer chemotherapy.   POSTOPERATIVE DIAGNOSES:  Colon cancer chemotherapy.   PROCEDURE:  Removal of Port-A-Cath.   SURGEON:  Timothy E. Earlene Plater, M.D.   ANESTHESIA:  Local standby.   Mr. Irion underwent right colectomy for carcinoma T3N0 lesion, elected to  have chemotherapy, this was completed and Port-A-Cath is now to be removed.  He agrees and understands.   The patient was seen, evaluated, identified and the permit signed.   He was taken to the operating room, placed supine, IV sedation given. The  left upper chest was prepped and draped in the usual fashion, 025% Marcaine  with epinephrine was used for local anesthesia. The old incision was used,  the skin and subcutaneous space was entered, Port-A-Cath identified,  grasped, sutures cut and the Port-A-Cath and catheter removed intact. No  bleeding or complications.  Wound closed in layers with 3-0 Monocryl.  Tolerated it well, counts correct, Steri-Strips and dry sterile dressing  applied.   The patient removed to the recovery room in good condition. Instructions  given, will be seen in routine followup.                                               Timothy E. Earlene Plater, M.D.    TED/MEDQ  D:  06/04/2003  T:  06/04/2003  Job:  [id number]   cc:   Pierce Crane, M.D.  [address] - [facility]  [place]  [place] [address]  Fax: [phone]

## 2012-02-21 ENCOUNTER — Encounter: Payer: Self-pay | Admitting: Gastroenterology

## 2012-02-22 ENCOUNTER — Encounter: Payer: Self-pay | Admitting: Gastroenterology

## 2012-11-13 ENCOUNTER — Encounter: Payer: Self-pay | Admitting: Gastroenterology

## 2012-11-16 ENCOUNTER — Encounter: Payer: Self-pay | Admitting: Gastroenterology

## 2013-04-09 ENCOUNTER — Ambulatory Visit: Payer: Self-pay | Admitting: Family Medicine

## 2013-04-18 ENCOUNTER — Ambulatory Visit: Payer: Self-pay | Admitting: Family Medicine

## 2013-05-02 ENCOUNTER — Ambulatory Visit: Payer: Self-pay | Admitting: Family Medicine

## 2013-05-14 ENCOUNTER — Ambulatory Visit: Payer: Self-pay | Admitting: Family Medicine

## 2014-10-16 ENCOUNTER — Encounter: Payer: Self-pay | Admitting: Gastroenterology

## 2017-09-15 ENCOUNTER — Emergency Department (HOSPITAL_COMMUNITY): Payer: Self-pay

## 2017-09-15 ENCOUNTER — Emergency Department (HOSPITAL_COMMUNITY)
Admission: EM | Admit: 2017-09-15 | Discharge: 2017-09-15 | Disposition: A | Discharge status: Home / Self Care | Payer: Self-pay | Attending: Emergency Medicine | Admitting: Emergency Medicine

## 2017-09-15 ENCOUNTER — Other Ambulatory Visit: Payer: Self-pay

## 2017-09-15 ENCOUNTER — Encounter (HOSPITAL_COMMUNITY): Payer: Self-pay | Admitting: Emergency Medicine

## 2017-09-15 DIAGNOSIS — S22000A Wedge compression fracture of unspecified thoracic vertebra, initial encounter for closed fracture: Secondary | ICD-10-CM

## 2017-09-15 DIAGNOSIS — S52502A Unspecified fracture of the lower end of left radius, initial encounter for closed fracture: Secondary | ICD-10-CM | POA: Insufficient documentation

## 2017-09-15 DIAGNOSIS — Y929 Unspecified place or not applicable: Secondary | ICD-10-CM | POA: Insufficient documentation

## 2017-09-15 DIAGNOSIS — S32010A Wedge compression fracture of first lumbar vertebra, initial encounter for closed fracture: Secondary | ICD-10-CM | POA: Insufficient documentation

## 2017-09-15 DIAGNOSIS — S5290XA Unspecified fracture of unspecified forearm, initial encounter for closed fracture: Secondary | ICD-10-CM

## 2017-09-15 DIAGNOSIS — S52572A Other intraarticular fracture of lower end of left radius, initial encounter for closed fracture: Secondary | ICD-10-CM

## 2017-09-15 DIAGNOSIS — S22080A Wedge compression fracture of T11-T12 vertebra, initial encounter for closed fracture: Secondary | ICD-10-CM | POA: Insufficient documentation

## 2017-09-15 DIAGNOSIS — S52612A Displaced fracture of left ulna styloid process, initial encounter for closed fracture: Secondary | ICD-10-CM | POA: Insufficient documentation

## 2017-09-15 DIAGNOSIS — Y939 Activity, unspecified: Secondary | ICD-10-CM | POA: Insufficient documentation

## 2017-09-15 DIAGNOSIS — W132XXA Fall from, out of or through roof, initial encounter: Secondary | ICD-10-CM | POA: Insufficient documentation

## 2017-09-15 DIAGNOSIS — R109 Unspecified abdominal pain: Secondary | ICD-10-CM | POA: Insufficient documentation

## 2017-09-15 DIAGNOSIS — Y999 Unspecified external cause status: Secondary | ICD-10-CM | POA: Insufficient documentation

## 2017-09-15 LAB — BASIC METABOLIC PANEL
Anion gap: 9 (ref 5–15)
BUN: 13 mg/dL (ref 6–20)
CO2: 26 mmol/L (ref 22–32)
Calcium: 9.5 mg/dL (ref 8.9–10.3)
Chloride: 102 mmol/L (ref 98–111)
Creatinine, Ser: 1.38 mg/dL — ABNORMAL HIGH (ref 0.61–1.24)
GFR calc Af Amer: >60 mL/min (ref >60)
GFR calc non Af Amer: 55 mL/min — ABNORMAL LOW (ref >60)
Glucose, Bld: 266 mg/dL — ABNORMAL HIGH (ref 70–99)
Potassium: 4.2 mmol/L (ref 3.5–5.1)
Sodium: 137 mmol/L (ref 135–145)

## 2017-09-15 LAB — CBC WITH DIFFERENTIAL/PLATELET
Basophils Absolute: 0 10*3/uL (ref 0.0–0.1)
Basophils Relative: 0 %
Eosinophils Absolute: 0.2 10*3/uL (ref 0.0–0.7)
Eosinophils Relative: 1 %
HCT: 43.6 % (ref 39.0–52.0)
Hemoglobin: 15.3 g/dL (ref 13.0–17.0)
Lymphocytes Relative: 15 %
Lymphs Abs: 1.7 10*3/uL (ref 0.7–4.0)
MCH: 32.8 pg (ref 26.0–34.0)
MCHC: 35.1 g/dL (ref 30.0–36.0)
MCV: 93.4 fL (ref 78.0–100.0)
Monocytes Absolute: 0.7 10*3/uL (ref 0.1–1.0)
Monocytes Relative: 6 %
Neutro Abs: 8.8 10*3/uL — ABNORMAL HIGH (ref 1.7–7.7)
Neutrophils Relative %: 78 %
Platelets: 201 10*3/uL (ref 150–400)
RBC: 4.67 MIL/uL (ref 4.22–5.81)
RDW: 12.7 % (ref 11.5–15.5)
WBC: 11.4 10*3/uL — ABNORMAL HIGH (ref 4.0–10.5)

## 2017-09-15 MED ORDER — KETOROLAC TROMETHAMINE 30 MG/ML IJ SOLN
15.0000 mg | Freq: Once | INTRAMUSCULAR | Status: AC
  Administered 2017-09-15: 15 mg via INTRAVENOUS
  Filled 2017-09-15: qty 1

## 2017-09-15 MED ORDER — OXYCODONE-ACETAMINOPHEN 5-325 MG PO TABS: 1.0000 | ORAL_TABLET | ORAL | 0 refills | Status: DC | PRN

## 2017-09-15 MED ORDER — HYDROMORPHONE HCL 1 MG/ML IJ SOLN
1.0000 mg | Freq: Once | INTRAMUSCULAR | Status: AC
  Administered 2017-09-15: 1 mg via INTRAVENOUS
  Filled 2017-09-15: qty 1

## 2017-09-15 MED ORDER — BUPIVACAINE HCL (PF) 0.25 % IJ SOLN
10.0000 mL | Freq: Once | INTRAMUSCULAR | Status: AC
  Administered 2017-09-15: 5 mL
  Filled 2017-09-15: qty 30

## 2017-09-15 MED ORDER — SODIUM CHLORIDE 0.9 % IV BOLUS
1000.0000 mL | Freq: Once | INTRAVENOUS | Status: AC
  Administered 2017-09-15: 1000 mL via INTRAVENOUS

## 2017-09-15 MED ORDER — IOPAMIDOL (ISOVUE-300) INJECTION 61%
100.0000 mL | Freq: Once | INTRAVENOUS | Status: AC | PRN
  Administered 2017-09-15: 100 mL via INTRAVENOUS

## 2017-09-15 NOTE — ED Triage Notes (Signed)
Pt was on a roof, slip and fell off a roof landing on his back.  Pt c/o of back, neck and left wrist pain. Swelling noted to left wrist.  C collar placed.

## 2017-09-15 NOTE — ED Provider Notes (Signed)
[facility] EMERGENCY DEPARTMENT Provider Note   CSN: [id number] Arrival date & time: 09/15/17  1851     History   Chief Complaint Chief Complaint  Patient presents with  . Fall    HPI Scott Day is a 57 y.o. male.  HPI   16yM s/p fall from roof. Happened just before arrival. Estimate he was 10 feet up. Fell in almost a seated position with hands down. Severe lower back and L wrist pain. No numbness or tingling. No blood thinners. Doesn't think hit head. No HA or neck pain.   History reviewed. No pertinent past medical history.  There are no active problems to display for this patient.   History reviewed. No pertinent surgical history.      Home Medications    Prior to Admission medications   Not on File    Family History No family history on file.  Social History Social History   Tobacco Use  . Smoking status: Never Smoker  . Smokeless tobacco: Never Used  Substance Use Topics  . Alcohol use: Never    Frequency: Never  . Drug use: Never     Allergies   Patient has no allergy information on record.   Review of Systems Review of Systems  All systems reviewed and negative, other than as noted in HPI.  Physical Exam Updated Vital Signs BP (!) 147/110 (BP Location: Right Arm)   Pulse 74   Temp 97.6 F (36.4 C) (Temporal)   Resp (!) 26   Ht 6' (1.829 m)   Wt 112.9 kg (249 lb)   SpO2 100%   BMI 33.77 kg/m   Physical Exam  Constitutional: He appears well-developed and well-nourished. No distress.  HENT:  Head: Normocephalic and atraumatic.  Eyes: Conjunctivae are normal. Right eye exhibits no discharge. Left eye exhibits no discharge.  Neck: Neck supple.  Cardiovascular: Normal rate, regular rhythm and normal heart sounds. Exam reveals no gallop and no friction rub.  No murmur heard. Pulmonary/Chest: Effort normal and breath sounds normal. No respiratory distress.  Abdominal: Soft. He exhibits no distension. There is no tenderness.    Musculoskeletal:  Tenderness in the lumbar spine in the midline and paraspinally.  No step-off or deformities.  He is neurovascular intact in his lower extremities but complains of increased back pain with movement of his legs.  Tenderness and deformity to left wrist.  Neurovascularly intact.  Closed injury.  No midline cervical spine tenderness.  Neurological: He is alert.  Skin: Skin is warm and dry.  Psychiatric: He has a normal mood and affect. His behavior is normal. Thought content normal.  Nursing note and vitals reviewed.    ED Treatments / Results  Labs (all labs ordered are listed, but only abnormal results are displayed) Labs Reviewed  BASIC METABOLIC PANEL - Abnormal; Notable for the following components:      Result Value   Glucose, Bld 266 (*)    Creatinine, Ser 1.38 (*)    GFR calc non Af Amer 55 (*)    All other components within normal limits  CBC WITH DIFFERENTIAL/PLATELET - Abnormal; Notable for the following components:   WBC 11.4 (*)    Neutro Abs 8.8 (*)    All other components within normal limits    EKG None  Radiology Dg Wrist Complete Left  Result Date: 09/15/2017 CLINICAL DATA:  Fall with left wrist pain EXAM: LEFT WRIST - COMPLETE 3+ VIEW COMPARISON:  None. FINDINGS: Comminuted intra-articular left distal radius fracture  with impaction and mild apex volar angulation, without significant displacement. Minimally displaced left ulnar styloid fracture with 3 mm radial displacement of the distal fracture fragment. No dislocation. No suspicious focal osseous lesion. No significant arthropathy. Prominent left wrist soft tissue swelling. No radiopaque foreign body. IMPRESSION: 1. Comminuted intra-articular impacted mildly angulated left distal radius fracture. 2. Minimally displaced left ulnar styloid fracture. Electronically Signed   By: Ilona Sorrel M.D.   On: 09/15/2017 20:01   Ct Abdomen Pelvis W Contrast  Result Date: 09/15/2017 CLINICAL DATA:  Abdominal  trauma.  Fall from roof. EXAM: CT ABDOMEN AND PELVIS WITH CONTRAST TECHNIQUE: Multidetector CT imaging of the abdomen and pelvis was performed using the standard protocol following bolus administration of intravenous contrast. CONTRAST:  175mL ISOVUE-300 IOPAMIDOL (ISOVUE-300) INJECTION 61% COMPARISON:  CT abdomen pelvis 12/26/2006 FINDINGS: LOWER CHEST: No basilar pulmonary nodules or pleural effusion. No apical pericardial effusion. HEPATOBILIARY: Normal hepatic contours and density. No intra- or extrahepatic biliary dilatation. Normal gallbladder. PANCREAS: Normal parenchymal contours without ductal dilatation. No peripancreatic fluid collection. SPLEEN: Normal. ADRENALS/URINARY TRACT: --Adrenal glands: Normal. --Right kidney/ureter: No hydronephrosis, nephroureterolithiasis, perinephric stranding or solid renal mass. --Left kidney/ureter: No hydronephrosis, nephroureterolithiasis, perinephric stranding or solid renal mass. --Urinary bladder: Normal for degree of distention STOMACH/BOWEL: --Stomach/Duodenum: No hiatal hernia or other gastric abnormality. Normal duodenal course. --Small bowel: No dilatation or inflammation. --Colon: No focal abnormality. --Appendix: Surgically absent. VASCULAR/LYMPHATIC: Atherosclerotic calcification is present within the non-aneurysmal abdominal aorta, without hemodynamically significant stenosis. The portal vein, splenic vein, superior mesenteric vein and IVC are patent. No abdominal or pelvic lymphadenopathy. REPRODUCTIVE: Normal prostate and seminal vesicles. MUSCULOSKELETAL. There are acute compression fractures of T12 and L1 with less than 25% height loss. The fractures involve the anterior walls and superior endplates. No posterior wall or neural arch involvement. Normal alignment is preserved. OTHER: None. IMPRESSION: 1. Acute compression fractures of T12 and L1 with slightly less than 25% height loss. The fractures traverse the superior endplates and anterior walls. 2. No  traumatic visceral injury. 3.  Aortic Atherosclerosis (ICD10-I70.0). These results were called by telephone at the time of interpretation on 09/15/2017 at 9:19 pm to Dr. Virgel Manifold , who verbally acknowledged these results. Electronically Signed   By: Ulyses Jarred M.D.   On: 09/15/2017 21:19    Procedures Procedures (including critical care time)  Hematoma Block: Consent: verbal consent obtained Indication: Pain Relief, Orthopedic Manipulation Medications: 5cc of 0.25% bupivacaine. Skin overlying hematoma cleansed in typical fashion. 25g needle used. Hematoma entered over dorsal aspect of wrist and directed towards fracture site. Dark red blood aspirated prior to injecting. Pt tolerated procedure well w/o any apparent complication.  Reduction of fracture/dislocation Date/Time: 9:34 PM Performed by: Virgel Manifold Authorized by: Virgel Manifold Consent: Verbal consent obtained. Risks and benefits: risks, benefits and alternatives were discussed Consent given by: patient Required items: required blood products, implants, devices, and special equipment available Time out: Immediately prior to procedure a "time out" was called to verify the correct patient, procedure, equipment, support staff and site/side marked as required.  Patient sedated: no, hematoma block  Vitals: Vital signs were monitored during sedation. Patient tolerance: Patient tolerated the procedure well with no immediate complications. Joint: L wrist/radius Reduction technique: finger traps   Medications Ordered in ED Medications  sodium chloride 0.9 % bolus 1,000 mL (0 mLs Intravenous Stopped 09/15/17 2108)  HYDROmorphone (DILAUDID) injection 1 mg (1 mg Intravenous Given 09/15/17 1932)  bupivacaine (PF) (MARCAINE) 0.25 % injection 10 mL (5 mLs Infiltration  Given 09/15/17 2115)  iopamidol (ISOVUE-300) 61 % injection 100 mL (100 mLs Intravenous Contrast Given 09/15/17 2044)  HYDROmorphone (DILAUDID) injection 1 mg (1 mg  Intravenous Given 09/15/17 2112)     Initial Impression / Assessment and Plan / ED Course  I have reviewed the triage vital signs and the nursing notes.  Pertinent labs & imaging results that were available during my care of the patient were reviewed by me and considered in my medical decision making (see chart for details).     57yM with fall from roof. Doesn't think hit head. No HA or neck pain. Closed L radius and ulnar styloid fx. NVI. Tried to reduce after hematoma block but unfortunately doesn't look much better. Hand surgery follow-up for definitive repair. T12/L1 compression fxs. Stable injuries. Nonfocal neuro exam. PRN pain meds. NS FU.   Final Clinical Impressions(s) / ED Diagnoses   Final diagnoses:  Other closed intra-articular fracture of distal end of left radius, initial encounter  Closed compression fracture of first lumbar vertebra, initial encounter ([place])  Compression fracture of body of thoracic vertebra ([facility])  Closed displaced fracture of styloid process of left ulna, initial encounter    ED Discharge Orders    None       Virgel Manifold, MD 09/19/17 [phone]

## 2017-09-15 NOTE — Discharge Instructions (Addendum)
The first three physicians arehand surgeons. Dr Vertell Limber is a neurosurgeon. Call tomorrow to make appointments. Take pain medication as needed. Take 600 mg of ibuprofen every 6 hours as needed with it.

## 2017-09-16 ENCOUNTER — Encounter (HOSPITAL_BASED_OUTPATIENT_CLINIC_OR_DEPARTMENT_OTHER): Payer: Self-pay | Admitting: *Deleted

## 2017-09-16 ENCOUNTER — Other Ambulatory Visit: Payer: Self-pay

## 2017-09-19 ENCOUNTER — Other Ambulatory Visit: Payer: Self-pay | Admitting: Orthopedic Surgery

## 2017-09-20 ENCOUNTER — Ambulatory Visit (HOSPITAL_BASED_OUTPATIENT_CLINIC_OR_DEPARTMENT_OTHER)
Admission: RE | Admit: 2017-09-20 | Discharge: 2017-09-20 | Disposition: A | Discharge status: Home / Self Care | Payer: Self-pay | Source: Ambulatory Visit | Attending: Orthopedic Surgery | Admitting: Orthopedic Surgery

## 2017-09-20 ENCOUNTER — Encounter (HOSPITAL_BASED_OUTPATIENT_CLINIC_OR_DEPARTMENT_OTHER): Payer: Self-pay | Admitting: *Deleted

## 2017-09-20 ENCOUNTER — Other Ambulatory Visit: Payer: Self-pay

## 2017-09-20 ENCOUNTER — Encounter (HOSPITAL_BASED_OUTPATIENT_CLINIC_OR_DEPARTMENT_OTHER)
Admission: RE | Disposition: A | Discharge status: Home / Self Care | Payer: Self-pay | Source: Ambulatory Visit | Attending: Orthopedic Surgery

## 2017-09-20 ENCOUNTER — Ambulatory Visit (HOSPITAL_BASED_OUTPATIENT_CLINIC_OR_DEPARTMENT_OTHER): Payer: Self-pay | Admitting: Anesthesiology

## 2017-09-20 DIAGNOSIS — S52572A Other intraarticular fracture of lower end of left radius, initial encounter for closed fracture: Secondary | ICD-10-CM | POA: Insufficient documentation

## 2017-09-20 DIAGNOSIS — Z79899 Other long term (current) drug therapy: Secondary | ICD-10-CM | POA: Insufficient documentation

## 2017-09-20 DIAGNOSIS — Z87891 Personal history of nicotine dependence: Secondary | ICD-10-CM | POA: Insufficient documentation

## 2017-09-20 DIAGNOSIS — Y92009 Unspecified place in unspecified non-institutional (private) residence as the place of occurrence of the external cause: Secondary | ICD-10-CM | POA: Insufficient documentation

## 2017-09-20 DIAGNOSIS — W1789XA Other fall from one level to another, initial encounter: Secondary | ICD-10-CM | POA: Insufficient documentation

## 2017-09-20 DIAGNOSIS — K219 Gastro-esophageal reflux disease without esophagitis: Secondary | ICD-10-CM | POA: Insufficient documentation

## 2017-09-20 HISTORY — PX: OPEN REDUCTION INTERNAL FIXATION (ORIF) DISTAL RADIAL FRACTURE: SHX5989

## 2017-09-20 HISTORY — DX: Gastro-esophageal reflux disease without esophagitis: K21.9

## 2017-09-20 HISTORY — DX: Unspecified fracture of the lower end of left radius, initial encounter for closed fracture: S52.502A

## 2017-09-20 HISTORY — DX: Malignant (primary) neoplasm, unspecified: C80.1

## 2017-09-20 SURGERY — OPEN REDUCTION INTERNAL FIXATION (ORIF) DISTAL RADIUS FRACTURE
Anesthesia: Regional | Site: Wrist | Laterality: Left

## 2017-09-20 MED ORDER — CLONIDINE HCL (ANALGESIA) 100 MCG/ML EP SOLN
EPIDURAL | Status: DC | PRN
  Administered 2017-09-20: 50 ug

## 2017-09-20 MED ORDER — DEXAMETHASONE SODIUM PHOSPHATE 10 MG/ML IJ SOLN
INTRAMUSCULAR | Status: AC
Start: 2017-09-20 — End: ?
  Filled 2017-09-20: qty 1

## 2017-09-20 MED ORDER — ONDANSETRON HCL 4 MG/2ML IJ SOLN
INTRAMUSCULAR | Status: AC
  Filled 2017-09-20: qty 2

## 2017-09-20 MED ORDER — MIDAZOLAM HCL 2 MG/2ML IJ SOLN
1.0000 mg | INTRAMUSCULAR | Status: DC | PRN
  Administered 2017-09-20 (×2): 2 mg via INTRAVENOUS

## 2017-09-20 MED ORDER — FENTANYL CITRATE (PF) 100 MCG/2ML IJ SOLN
INTRAMUSCULAR | Status: AC
  Filled 2017-09-20: qty 2

## 2017-09-20 MED ORDER — SCOPOLAMINE 1 MG/3DAYS TD PT72: 1.0000 | MEDICATED_PATCH | Freq: Once | TRANSDERMAL | Status: DC | PRN

## 2017-09-20 MED ORDER — PROPOFOL 500 MG/50ML IV EMUL
INTRAVENOUS | Status: DC | PRN
  Administered 2017-09-20: 150 ug/kg/min via INTRAVENOUS
  Administered 2017-09-20: 100 ug/kg/min via INTRAVENOUS

## 2017-09-20 MED ORDER — CEFAZOLIN SODIUM-DEXTROSE 2-4 GM/100ML-% IV SOLN
2.0000 g | INTRAVENOUS | Status: AC
  Administered 2017-09-20: 2 g via INTRAVENOUS

## 2017-09-20 MED ORDER — HYDROCODONE-ACETAMINOPHEN 5-325 MG PO TABS: ORAL_TABLET | ORAL | 0 refills | Status: DC

## 2017-09-20 MED ORDER — LIDOCAINE HCL (CARDIAC) PF 100 MG/5ML IV SOSY
PREFILLED_SYRINGE | INTRAVENOUS | Status: AC
  Filled 2017-09-20: qty 5

## 2017-09-20 MED ORDER — FENTANYL CITRATE (PF) 100 MCG/2ML IJ SOLN
50.0000 ug | INTRAMUSCULAR | Status: DC | PRN
  Administered 2017-09-20: 100 ug via INTRAVENOUS

## 2017-09-20 MED ORDER — MIDAZOLAM HCL 2 MG/2ML IJ SOLN
INTRAMUSCULAR | Status: AC
  Filled 2017-09-20: qty 2

## 2017-09-20 MED ORDER — LACTATED RINGERS IV SOLN
INTRAVENOUS | Status: DC
  Administered 2017-09-20: 12:00:00 via INTRAVENOUS

## 2017-09-20 MED ORDER — CEFAZOLIN SODIUM-DEXTROSE 2-4 GM/100ML-% IV SOLN
INTRAVENOUS | Status: AC
  Filled 2017-09-20: qty 100

## 2017-09-20 MED ORDER — ROPIVACAINE HCL 7.5 MG/ML IJ SOLN
INTRAMUSCULAR | Status: DC | PRN
  Administered 2017-09-20: 30 mL via PERINEURAL

## 2017-09-20 MED ORDER — LIDOCAINE HCL (CARDIAC) PF 100 MG/5ML IV SOSY
PREFILLED_SYRINGE | INTRAVENOUS | Status: DC | PRN
  Administered 2017-09-20: 50 mg via INTRAVENOUS

## 2017-09-20 MED ORDER — CHLORHEXIDINE GLUCONATE 4 % EX LIQD: 60.0000 mL | Freq: Once | CUTANEOUS | Status: DC

## 2017-09-20 SURGICAL SUPPLY — 55 items
BANDAGE ACE 3X5.8 VEL STRL LF (GAUZE/BANDAGES/DRESSINGS) ×2 IMPLANT
BIT DRILL 2.0 LNG QUCK RELEASE (BIT) ×1 IMPLANT
BIT DRILL 2.8X5 QR DISP (BIT) ×2 IMPLANT
BLADE SURG 15 STRL LF DISP TIS (BLADE) ×2 IMPLANT
BLADE SURG 15 STRL SS (BLADE) ×2
BNDG ESMARK 4X9 LF (GAUZE/BANDAGES/DRESSINGS) ×2 IMPLANT
BNDG GAUZE ELAST 4 BULKY (GAUZE/BANDAGES/DRESSINGS) ×2 IMPLANT
BNDG PLASTER X FAST 3X3 WHT LF (CAST SUPPLIES) ×20 IMPLANT
CHLORAPREP W/TINT 26ML (MISCELLANEOUS) ×2 IMPLANT
CORD BIPOLAR FORCEPS 12FT (ELECTRODE) ×2 IMPLANT
COVER BACK TABLE 60X90IN (DRAPES) ×2 IMPLANT
COVER MAYO STAND STRL (DRAPES) ×2 IMPLANT
CUFF TOURNIQUET SINGLE 18IN (TOURNIQUET CUFF) IMPLANT
CUFF TOURNIQUET SINGLE 24IN (TOURNIQUET CUFF) IMPLANT
DRAPE EXTREMITY T 121X128X90 (DRAPE) ×2 IMPLANT
DRAPE OEC MINIVIEW 54X84 (DRAPES) ×2 IMPLANT
DRAPE SURG 17X23 STRL (DRAPES) ×2 IMPLANT
DRILL 2.0 LNG QUICK RELEASE (BIT) ×2
GAUZE SPONGE 4X4 12PLY STRL (GAUZE/BANDAGES/DRESSINGS) ×2 IMPLANT
GAUZE XEROFORM 1X8 LF (GAUZE/BANDAGES/DRESSINGS) ×2 IMPLANT
GLOVE BIO SURGEON STRL SZ7.5 (GLOVE) ×2 IMPLANT
GLOVE BIOGEL PI IND STRL 8 (GLOVE) ×1 IMPLANT
GLOVE BIOGEL PI IND STRL 8.5 (GLOVE) IMPLANT
GLOVE BIOGEL PI INDICATOR 8 (GLOVE) ×1
GLOVE BIOGEL PI INDICATOR 8.5 (GLOVE)
GLOVE SURG ORTHO 8.0 STRL STRW (GLOVE) IMPLANT
GOWN STRL REUS W/ TWL LRG LVL3 (GOWN DISPOSABLE) ×1 IMPLANT
GOWN STRL REUS W/TWL LRG LVL3 (GOWN DISPOSABLE) ×1
GOWN STRL REUS W/TWL XL LVL3 (GOWN DISPOSABLE) ×2 IMPLANT
GUIDEWIRE ORTHO 0.054X6 (WIRE) ×6 IMPLANT
NEEDLE HYPO 25X1 1.5 SAFETY (NEEDLE) IMPLANT
NS IRRIG 1000ML POUR BTL (IV SOLUTION) ×2 IMPLANT
PACK BASIN DAY SURGERY FS (CUSTOM PROCEDURE TRAY) ×2 IMPLANT
PAD CAST 3X4 CTTN HI CHSV (CAST SUPPLIES) ×1 IMPLANT
PADDING CAST COTTON 3X4 STRL (CAST SUPPLIES) ×1
PLATE DISTAL RADIUS L WIDE (Plate) ×2 IMPLANT
SCREW BONE CORT 3.5X17MM HEXA (Screw) ×1 IMPLANT
SCREW CORT FT 18X2.3XLCK HEX (Screw) ×1 IMPLANT
SCREW CORT FT 22X2.3XLCK HEX (Screw) ×1 IMPLANT
SCREW CORTICAL 3.5X17 (Screw) ×2 IMPLANT
SCREW CORTICAL LOCKING 2.3X18M (Screw) ×1 IMPLANT
SCREW CORTICAL LOCKING 2.3X22M (Screw) ×1 IMPLANT
SCREW CORTICAL LOCKING 2.3X24M (Screw) ×5 IMPLANT
SCREW FX24X2.3XLCK SMTH NS CRT (Screw) ×5 IMPLANT
SCREW HEXALOBE NON-LOCK 3.5X16 (Screw) ×6 IMPLANT
SLEEVE SCD COMPRESS KNEE MED (MISCELLANEOUS) IMPLANT
SLING ARM FOAM STRAP LRG (SOFTGOODS) ×2 IMPLANT
STOCKINETTE 4X48 STRL (DRAPES) ×2 IMPLANT
SUT ETHILON 4 0 PS 2 18 (SUTURE) ×2 IMPLANT
SUT VICRYL 4-0 PS2 18IN ABS (SUTURE) ×2 IMPLANT
SYR BULB 3OZ (MISCELLANEOUS) ×2 IMPLANT
SYR CONTROL 10ML LL (SYRINGE) IMPLANT
TOWEL GREEN STERILE FF (TOWEL DISPOSABLE) ×4 IMPLANT
TOWEL OR NON WOVEN STRL DISP B (DISPOSABLE) ×2 IMPLANT
UNDERPAD 30X30 (UNDERPADS AND DIAPERS) ×2 IMPLANT

## 2017-09-20 NOTE — Progress Notes (Signed)
Assisted Dr. Germeroth with left, ultrasound guided, axillary block. Side rails up, monitors on throughout procedure. See vital signs in flow sheet. Tolerated Procedure well. 

## 2017-09-20 NOTE — Transfer of Care (Signed)
Immediate Anesthesia Transfer of Care Note  Patient: Scott Day  Procedure(s) Performed: OPEN REDUCTION INTERNAL FIXATION (ORIF) DISTAL RADIAL FRACTURE (Left Wrist)  Patient Location: PACU  Anesthesia Type:MAC  Level of Consciousness: awake  Airway & Oxygen Therapy: Patient Spontanous Breathing and Patient connected to face mask oxygen  Post-op Assessment: Report given to RN and Post -op Vital signs reviewed and stable  Post vital signs: Reviewed and stable  Last Vitals:  Vitals Value Taken Time  BP    Temp    Pulse 78 09/20/2017  2:49 PM  Resp 8 09/20/2017  2:49 PM  SpO2 100 % 09/20/2017  2:49 PM  Vitals shown include unvalidated device data.  Last Pain:  Vitals:   09/20/17 1223  TempSrc: Oral  PainSc: 9       Patients Stated Pain Goal: 4 (16/10/96 0454)  Complications: No apparent anesthesia complications

## 2017-09-20 NOTE — Anesthesia Procedure Notes (Signed)
Anesthesia Regional Block: Axillary brachial plexus block   Pre-Anesthetic Checklist: ,, timeout performed, Correct Patient, Correct Site, Correct Laterality, Correct Procedure, Correct Position, site marked, Risks and benefits discussed,  Surgical consent,  Pre-op evaluation,  At surgeon's request and post-op pain management  Laterality: Left  Prep: chloraprep       Needles:  Injection technique: Single-shot  Needle Type: Stimulator Needle - 40     Needle Length: 4cm  Needle Gauge: 22     Additional Needles:   Procedures:,,,, ultrasound used (permanent image in chart),,,,  Narrative:  Start time: 09/20/2017 12:43 PM End time: 09/20/2017 12:48 PM Injection made incrementally with aspirations every 5 mL. Anesthesiologist: Nolon Nations, MD  Additional Notes: BP cuff, EKG monitors applied. Sedation begun. Nerve location verified with U/S. Anesthetic injected incrementally, slowly , and after neg aspirations under direct u/s guidance. Good perineural spread. Tolerated well.

## 2017-09-20 NOTE — Anesthesia Preprocedure Evaluation (Signed)
Anesthesia Evaluation  Patient identified by MRN, date of birth, ID band Patient awake    Reviewed: Allergy & Precautions, NPO status , Patient's Chart, lab work & pertinent test results  Airway Mallampati: II  TM Distance: >3 FB Neck ROM: Full    Dental no notable dental hx.    Pulmonary neg pulmonary ROS, former smoker,    Pulmonary exam normal breath sounds clear to auscultation       Cardiovascular negative cardio ROS Normal cardiovascular exam Rhythm:Regular Rate:Normal     Neuro/Psych negative neurological ROS  negative psych ROS   GI/Hepatic Neg liver ROS, GERD  ,  Endo/Other  negative endocrine ROS  Renal/GU negative Renal ROS     Musculoskeletal negative musculoskeletal ROS (+)   Abdominal   Peds  Hematology negative hematology ROS (+)   Anesthesia Other Findings   Reproductive/Obstetrics                             Anesthesia Physical Anesthesia Plan  ASA: II  Anesthesia Plan: Regional   Post-op Pain Management:    Induction:   PONV Risk Score and Plan: 1 and Ondansetron and Propofol infusion  Airway Management Planned: Natural Airway  Additional Equipment:   Intra-op Plan:   Post-operative Plan:   Informed Consent: I have reviewed the patients History and Physical, chart, labs and discussed the procedure including the risks, benefits and alternatives for the proposed anesthesia with the patient or authorized representative who has indicated his/her understanding and acceptance.   Dental advisory given  Plan Discussed with: CRNA  Anesthesia Plan Comments:         Anesthesia Quick Evaluation

## 2017-09-20 NOTE — Op Note (Addendum)
09/20/2017 [facility]  Operative Note  Pre Op Diagnosis: Left comminuted intraarticular distal radius fracture  Post Op Diagnosis: Left comminuted intraarticular distal radius fracture  Procedure:  1. ORIF Left comminuted intraarticular distal radius fracture, 3 intra-articular fragments 2. Left brachioradialis release  Surgeon: Leanora Cover, MD  Assistant: none Daryll Brod, MD  Anesthesia: General with regional  Fluids: Per anesthesia flow sheet  EBL: minimal  Complications: None  Specimen: None  Tourniquet Time:  Total Tourniquet Time Documented: Upper Arm (Left) - 54 minutes Total: Upper Arm (Left) - 54 minutes   Disposition: Stable to PACU  INDICATIONS:  Scott Day is a 57 y.o. male states he fell from a roof 5 days ago injuring his left wrist.  He was seen in the emergency department where radiograph were taken revealing a distal radius fracture.  He was splinted and followed up in the office. We discussed nonoperative and operative treatment options.  He wished to proceed with operative fixation.  Risks, benefits, and alternatives of surgery were discussed including the risk of blood loss; infection; damage to nerves, vessels, tendons, ligaments, bone; failure of surgery; need for additional surgery; complications with wound healing; continued pain; nonunion; malunion; stiffness.  We also discussed the possible need for bone graft and the benefits and risks including the possibility of disease transmission.  He voiced understanding of these risks and elected to proceed.    OPERATIVE COURSE:  After being identified preoperatively by myself, the patient and I agreed upon the procedure and site of procedure.  Surgical site was marked.  The risks, benefits and alternatives of the surgery were reviewed and he wished to proceed.  Surgical consent had been signed.  He was given IV Ancef as preoperative antibiotic prophylaxis.  He was transferred to the operating  room and placed on the operating room table in supine position with the Left upper extremity on an armboard. General anesthesia was induced by the anesthesiologist.  A regional block had been performed by anesthesia in preoperative holding.  The Left upper extremity was prepped and draped in normal sterile orthopedic fashion.  A surgical pause was performed between the surgeons, anesthesia and operating room staff, and all were in agreement as to the patient, procedure and site of procedure.  Tourniquet at the proximal aspect of the extremity was inflated to 250 mmHg after exsanguination of the limb with an Esmarch bandage.  Standard volar Mallie Mussel approach was used.  The bipolar electrocautery was used to obtain hemostasis.  The superficial and deep portions of the FCR tendon sheath were incised, and the FCR and FPL were swept ulnarly to protect the palmar cutaneous branch of the median nerve.  The brachioradialis was released at the radial side of the radius.  The pronator quadratus was released and elevated with the periosteal elevator.  The fracture site was identified and cleared of soft tissue interposition and hematoma.  It was reduced under direct visualization.  There is intra-articular extension creating 3 or more intra-articular fragments.   An AcuMed volar distal radial locking plate was selected.  It was secured to the bone with the guidepins.  C-arm was used in AP and lateral projections to ensure appropriate reduction and position of the hardware and adjustments made as necessary.  Standard AO drilling and measuring technique was used.  A single screw was placed in the slotted hole in the shaft of the plate.  The distal holes were filled with locking pegs with the exception of the styloid  holes, which were filled with locking screws.  The remaining holes in the shaft of the plate were filled with nonlocking screws.  Good purchase was obtained.  C-arm was used in AP, lateral and oblique projections to  ensure appropriate reduction and position of hardware, which was the case.  There was no intra-articular penetration of hardware.  The wound was copiously irrigated with sterile saline.  Pronator quadratus was repaired back over top of the plate using 4-0 Vicryl suture.  Vicryl suture was placed in the subcutaneous tissues in an inverted interrupted fashion and the skin was closed with 4-0 nylon in a horizontal mattress fashion.  There was good pronation and supination of the wrist without crepitance.  The wound was then dressed with sterile Xeroform, 4x4s, and wrapped with a Kerlix bandage.  A volar splint was placed and wrapped with Kerlix and Ace bandage.  Tourniquet was deflated at 54 minutes.  Fingertips were pink with brisk capillary refill after deflation of the tourniquet.  Operative drapes were broken down.  The patient was awoken from anesthesia safely.  He was transferred back to the stretcher and taken to the PACU in stable condition.  I will see him back in the office in one week for postoperative followup.  I will give him a prescription for Norco 5/325 1-2 tabs PO q6 hours prn pain, dispense # 30.    Tennis Must, MD Electronically signed, 09/20/17

## 2017-09-20 NOTE — H&P (Signed)
  Scott Day is an 57 y.o. male.   Chief Complaint: Left distal radius fracture HPI: 57 year old right-hand-dominant male states he fell from a roof 5 days ago injuring his left wrist.  He was seen in the emergency department where radiograph were taken revealing distal radius fracture.  He was splinted and followed up in the office.  He wishes to proceed with operative fixation.  Allergies: No Known Allergies  Past Medical History:  Diagnosis Date  . Cancer [facility]) 2012   colon cancer  . Closed fracture of left distal radius   . GERD (gastroesophageal reflux disease)     Past Surgical History:  Procedure Laterality Date  . COLON SURGERY  2012   colon cancer    Family History: History reviewed. No pertinent family history.  Social History:   reports that he has quit smoking. He has never used smokeless tobacco. He reports that he drinks alcohol. He reports that he does not use drugs.  Medications: Medications Prior to Admission  Medication Sig Dispense Refill  . calcium carbonate (TUMS - DOSED IN MG ELEMENTAL CALCIUM) 500 MG chewable tablet Chew 1 tablet by mouth daily.    Marland Kitchen ibuprofen (ADVIL,MOTRIN) 400 MG tablet Take 800 mg by mouth every 6 (six) hours as needed.     Marland Kitchen oxyCODONE-acetaminophen (PERCOCET/ROXICET) 5-325 MG tablet Take 1-2 tablets by mouth every 4 (four) hours as needed. 20 tablet 0    No results found for this or any previous visit (from the past 48 hour(s)).  No results found.   A comprehensive review of systems was negative.  Height 6' (1.829 m), weight 113.4 kg (250 lb).  General appearance: alert, cooperative and appears stated age Head: Normocephalic, without obvious abnormality, atraumatic Neck: supple, symmetrical, trachea midline Cardio: regular rate and rhythm Resp: clear to auscultation bilaterally Extremities: Intact sensation and capillary refill all digits.  +epl/fpl/io.  No wounds. Pulses: 2+ and symmetric Skin: Skin color, texture,  turgor normal. No rashes or lesions Neurologic: Grossly normal Incision/Wound: None  Assessment/Plan Left distal radius fracture.  Non operative and operative treatment options were discussed with the patient and patient wishes to proceed with operative treatment. Risks, benefits, and alternatives of surgery were discussed and the patient agrees with the plan of care.   Scott Day R 09/20/2017, 12:26 PM

## 2017-09-20 NOTE — Discharge Instructions (Addendum)

## 2017-09-20 NOTE — Anesthesia Procedure Notes (Signed)
Procedure Name: MAC Date/Time: 09/20/2017 1:50 PM Performed by: Lieutenant Diego, CRNA Pre-anesthesia Checklist: Patient identified, Timeout performed, Emergency Drugs available, Suction available and Patient being monitored Patient Re-evaluated:Patient Re-evaluated prior to induction Oxygen Delivery Method: Simple face mask Preoxygenation: Pre-oxygenation with 100% oxygen Induction Type: IV induction

## 2017-09-20 NOTE — Op Note (Signed)
I assisted Surgeon(s) and Role:    * Leanora Cover, MD - Primary    Daryll Brod, MD - Assisting on the Procedure(s): OPEN REDUCTION INTERNAL FIXATION (ORIF) DISTAL RADIAL FRACTURE on 09/20/2017.  I provided assistance on this case as follows: setup, approach, debridement,reduction,stabilization and fixation of the fracture,closure of the wound and application of the dressings and splint.  Electronically signed by: Wynonia Sours, MD Date: 09/20/2017 Time: 2:48 PM

## 2017-09-21 ENCOUNTER — Encounter (HOSPITAL_BASED_OUTPATIENT_CLINIC_OR_DEPARTMENT_OTHER): Payer: Self-pay | Admitting: Orthopedic Surgery

## 2017-09-21 NOTE — Anesthesia Postprocedure Evaluation (Signed)
Anesthesia Post Note  Patient: Scott Day  Procedure(s) Performed: OPEN REDUCTION INTERNAL FIXATION (ORIF) DISTAL RADIAL FRACTURE (Left Wrist)     Patient location during evaluation: PACU Anesthesia Type: Regional Level of consciousness: awake and alert Pain management: pain level controlled Vital Signs Assessment: post-procedure vital signs reviewed and stable Respiratory status: spontaneous breathing Cardiovascular status: stable Anesthetic complications: no    Last Vitals:  Vitals:   09/20/17 1515 09/20/17 1551  BP: 109/71 (!) 167/78  Pulse: 62 66  Resp: 20   Temp:  36.6 C  SpO2: 100% 96%    Last Pain:  Vitals:   09/20/17 1551  TempSrc: Oral  PainSc: 0-No pain                 Nolon Nations

## 2017-09-21 NOTE — Op Note (Signed)
Intra-operative fluoroscopic images in the AP, lateral, and oblique views were taken and evaluated by myself.  Reduction and hardware placement were confirmed.  There was no intraarticular penetration of permanent hardware.  

## 2019-05-21 DIAGNOSIS — I639 Cerebral infarction, unspecified: Secondary | ICD-10-CM

## 2019-05-21 HISTORY — DX: Cerebral infarction, unspecified: I63.9

## 2019-06-04 ENCOUNTER — Emergency Department: Payer: Medicaid Other

## 2019-06-04 ENCOUNTER — Inpatient Hospital Stay (HOSPITAL_COMMUNITY)
Admission: EM | Admit: 2019-06-04 | Discharge: 2019-06-09 | DRG: 024 | Disposition: A | Discharge status: Skilled Nursing Facility | Payer: Medicaid Other | Attending: Neurology | Admitting: Neurology

## 2019-06-04 ENCOUNTER — Inpatient Hospital Stay (HOSPITAL_COMMUNITY): Payer: Medicaid Other

## 2019-06-04 ENCOUNTER — Inpatient Hospital Stay (HOSPITAL_COMMUNITY): Payer: Medicaid Other | Admitting: Certified Registered"

## 2019-06-04 ENCOUNTER — Other Ambulatory Visit: Payer: Self-pay

## 2019-06-04 ENCOUNTER — Encounter (HOSPITAL_COMMUNITY): Payer: Self-pay | Admitting: Certified Registered"

## 2019-06-04 ENCOUNTER — Other Ambulatory Visit (HOSPITAL_COMMUNITY): Payer: Self-pay | Admitting: Neurology

## 2019-06-04 ENCOUNTER — Inpatient Hospital Stay (HOSPITAL_COMMUNITY)
Admission: RE | Admit: 2019-06-04 | Discharge: 2019-06-04 | Disposition: A | Discharge status: Home / Self Care | Payer: Medicaid Other | Source: Ambulatory Visit | Attending: Neurology | Admitting: Neurology

## 2019-06-04 ENCOUNTER — Emergency Department
Admission: EM | Admit: 2019-06-04 | Discharge: 2019-06-04 | Disposition: A | Discharge status: Other Acute Inpatient Hospital | Payer: Medicaid Other | Attending: Emergency Medicine | Admitting: Emergency Medicine

## 2019-06-04 ENCOUNTER — Encounter (HOSPITAL_COMMUNITY)
Admission: EM | Disposition: A | Discharge status: Skilled Nursing Facility | Payer: Self-pay | Source: Ambulatory Visit | Attending: Neurology

## 2019-06-04 DIAGNOSIS — E6609 Other obesity due to excess calories: Secondary | ICD-10-CM

## 2019-06-04 DIAGNOSIS — I639 Cerebral infarction, unspecified: Secondary | ICD-10-CM | POA: Diagnosis not present

## 2019-06-04 DIAGNOSIS — R471 Dysarthria and anarthria: Secondary | ICD-10-CM | POA: Diagnosis present

## 2019-06-04 DIAGNOSIS — B9620 Unspecified Escherichia coli [E. coli] as the cause of diseases classified elsewhere: Secondary | ICD-10-CM | POA: Diagnosis not present

## 2019-06-04 DIAGNOSIS — I63511 Cerebral infarction due to unspecified occlusion or stenosis of right middle cerebral artery: Principal | ICD-10-CM | POA: Diagnosis present

## 2019-06-04 DIAGNOSIS — E871 Hypo-osmolality and hyponatremia: Secondary | ICD-10-CM | POA: Diagnosis present

## 2019-06-04 DIAGNOSIS — E785 Hyperlipidemia, unspecified: Secondary | ICD-10-CM | POA: Diagnosis not present

## 2019-06-04 DIAGNOSIS — R7309 Other abnormal glucose: Secondary | ICD-10-CM | POA: Diagnosis not present

## 2019-06-04 DIAGNOSIS — R29710 NIHSS score 10: Secondary | ICD-10-CM | POA: Diagnosis not present

## 2019-06-04 DIAGNOSIS — E669 Obesity, unspecified: Secondary | ICD-10-CM | POA: Diagnosis present

## 2019-06-04 DIAGNOSIS — R29810 Facial weakness: Secondary | ICD-10-CM | POA: Diagnosis present

## 2019-06-04 DIAGNOSIS — I10 Essential (primary) hypertension: Secondary | ICD-10-CM | POA: Diagnosis present

## 2019-06-04 DIAGNOSIS — D72829 Elevated white blood cell count, unspecified: Secondary | ICD-10-CM

## 2019-06-04 DIAGNOSIS — Z0189 Encounter for other specified special examinations: Secondary | ICD-10-CM

## 2019-06-04 DIAGNOSIS — I63311 Cerebral infarction due to thrombosis of right middle cerebral artery: Secondary | ICD-10-CM

## 2019-06-04 DIAGNOSIS — I69354 Hemiplegia and hemiparesis following cerebral infarction affecting left non-dominant side: Secondary | ICD-10-CM | POA: Diagnosis not present

## 2019-06-04 DIAGNOSIS — E1165 Type 2 diabetes mellitus with hyperglycemia: Secondary | ICD-10-CM | POA: Diagnosis not present

## 2019-06-04 DIAGNOSIS — R1312 Dysphagia, oropharyngeal phase: Secondary | ICD-10-CM | POA: Diagnosis not present

## 2019-06-04 DIAGNOSIS — G8194 Hemiplegia, unspecified affecting left nondominant side: Secondary | ICD-10-CM | POA: Diagnosis not present

## 2019-06-04 DIAGNOSIS — E7800 Pure hypercholesterolemia, unspecified: Secondary | ICD-10-CM | POA: Diagnosis not present

## 2019-06-04 DIAGNOSIS — I6389 Other cerebral infarction: Secondary | ICD-10-CM | POA: Diagnosis not present

## 2019-06-04 DIAGNOSIS — Z20822 Contact with and (suspected) exposure to covid-19: Secondary | ICD-10-CM | POA: Diagnosis present

## 2019-06-04 DIAGNOSIS — Z79891 Long term (current) use of opiate analgesic: Secondary | ICD-10-CM

## 2019-06-04 DIAGNOSIS — E119 Type 2 diabetes mellitus without complications: Secondary | ICD-10-CM | POA: Diagnosis not present

## 2019-06-04 DIAGNOSIS — R4781 Slurred speech: Secondary | ICD-10-CM | POA: Insufficient documentation

## 2019-06-04 DIAGNOSIS — Z4659 Encounter for fitting and adjustment of other gastrointestinal appliance and device: Secondary | ICD-10-CM

## 2019-06-04 DIAGNOSIS — K219 Gastro-esophageal reflux disease without esophagitis: Secondary | ICD-10-CM | POA: Diagnosis present

## 2019-06-04 DIAGNOSIS — Z87891 Personal history of nicotine dependence: Secondary | ICD-10-CM | POA: Diagnosis not present

## 2019-06-04 DIAGNOSIS — Z978 Presence of other specified devices: Secondary | ICD-10-CM

## 2019-06-04 DIAGNOSIS — Z6832 Body mass index (BMI) 32.0-32.9, adult: Secondary | ICD-10-CM | POA: Diagnosis not present

## 2019-06-04 DIAGNOSIS — N390 Urinary tract infection, site not specified: Secondary | ICD-10-CM | POA: Diagnosis not present

## 2019-06-04 DIAGNOSIS — N3000 Acute cystitis without hematuria: Secondary | ICD-10-CM

## 2019-06-04 DIAGNOSIS — I69391 Dysphagia following cerebral infarction: Secondary | ICD-10-CM | POA: Diagnosis not present

## 2019-06-04 DIAGNOSIS — Z85038 Personal history of other malignant neoplasm of large intestine: Secondary | ICD-10-CM

## 2019-06-04 DIAGNOSIS — Z9221 Personal history of antineoplastic chemotherapy: Secondary | ICD-10-CM

## 2019-06-04 DIAGNOSIS — R531 Weakness: Secondary | ICD-10-CM | POA: Diagnosis not present

## 2019-06-04 DIAGNOSIS — E1159 Type 2 diabetes mellitus with other circulatory complications: Secondary | ICD-10-CM | POA: Diagnosis not present

## 2019-06-04 DIAGNOSIS — E876 Hypokalemia: Secondary | ICD-10-CM

## 2019-06-04 DIAGNOSIS — Z720 Tobacco use: Secondary | ICD-10-CM | POA: Diagnosis not present

## 2019-06-04 HISTORY — PX: IR INTRA CRAN STENT: IMG2345

## 2019-06-04 HISTORY — PX: IR PERCUTANEOUS ART THROMBECTOMY/INFUSION INTRACRANIAL INC DIAG ANGIO: IMG6087

## 2019-06-04 HISTORY — PX: IR CT HEAD LTD: IMG2386

## 2019-06-04 HISTORY — PX: RADIOLOGY WITH ANESTHESIA: SHX6223

## 2019-06-04 HISTORY — PX: IR US GUIDE VASC ACCESS RIGHT: IMG2390

## 2019-06-04 LAB — DIFFERENTIAL
Abs Immature Granulocytes: 0.03 10*3/uL (ref 0.00–0.07)
Basophils Absolute: 0 10*3/uL (ref 0.0–0.1)
Basophils Relative: 1 %
Eosinophils Absolute: 0.1 10*3/uL (ref 0.0–0.5)
Eosinophils Relative: 1 %
Immature Granulocytes: 0 %
Lymphocytes Relative: 14 %
Lymphs Abs: 1.2 10*3/uL (ref 0.7–4.0)
Monocytes Absolute: 0.4 10*3/uL (ref 0.1–1.0)
Monocytes Relative: 5 %
Neutro Abs: 6.7 10*3/uL (ref 1.7–7.7)
Neutrophils Relative %: 79 %

## 2019-06-04 LAB — COMPREHENSIVE METABOLIC PANEL
ALT: 24 U/L (ref 0–44)
AST: 19 U/L (ref 15–41)
Albumin: 3.9 g/dL (ref 3.5–5.0)
Alkaline Phosphatase: 74 U/L (ref 38–126)
Anion gap: 9 (ref 5–15)
BUN: 7 mg/dL (ref 6–20)
CO2: 26 mmol/L (ref 22–32)
Calcium: 8.8 mg/dL — ABNORMAL LOW (ref 8.9–10.3)
Chloride: 98 mmol/L (ref 98–111)
Creatinine, Ser: 0.91 mg/dL (ref 0.61–1.24)
GFR calc Af Amer: >60 mL/min (ref >60)
GFR calc non Af Amer: >60 mL/min (ref >60)
Glucose, Bld: 213 mg/dL — ABNORMAL HIGH (ref 70–99)
Potassium: 4 mmol/L (ref 3.5–5.1)
Sodium: 133 mmol/L — ABNORMAL LOW (ref 135–145)
Total Bilirubin: 0.9 mg/dL (ref 0.3–1.2)
Total Protein: 6.9 g/dL (ref 6.5–8.1)

## 2019-06-04 LAB — PROTIME-INR
INR: 1.1 (ref 0.8–1.2)
Prothrombin Time: 13.8 seconds (ref 11.4–15.2)

## 2019-06-04 LAB — RESPIRATORY PANEL BY RT PCR (FLU A&B, COVID)
Influenza A by PCR: NEGATIVE
Influenza B by PCR: NEGATIVE
SARS Coronavirus 2 by RT PCR: NEGATIVE

## 2019-06-04 LAB — HIV ANTIBODY (ROUTINE TESTING W REFLEX): HIV Screen 4th Generation wRfx: NONREACTIVE

## 2019-06-04 LAB — CBC
HCT: 44.3 % (ref 39.0–52.0)
Hemoglobin: 15.2 g/dL (ref 13.0–17.0)
MCH: 31.7 pg (ref 26.0–34.0)
MCHC: 34.3 g/dL (ref 30.0–36.0)
MCV: 92.3 fL (ref 80.0–100.0)
Platelets: 216 10*3/uL (ref 150–400)
RBC: 4.8 MIL/uL (ref 4.22–5.81)
RDW: 12.4 % (ref 11.5–15.5)
WBC: 8.4 10*3/uL (ref 4.0–10.5)
nRBC: 0 % (ref 0.0–0.2)

## 2019-06-04 LAB — APTT: aPTT: 31 seconds (ref 24–36)

## 2019-06-04 LAB — MRSA PCR SCREENING: MRSA by PCR: NEGATIVE

## 2019-06-04 LAB — ETHANOL: Alcohol, Ethyl (B): <10 mg/dL (ref <10)

## 2019-06-04 SURGERY — IR WITH ANESTHESIA
Anesthesia: General

## 2019-06-04 MED ORDER — ACETAMINOPHEN 160 MG/5ML PO SOLN
650.0000 mg | ORAL | Status: DC | PRN
  Administered 2019-06-04 – 2019-06-06 (×3): 650 mg
  Filled 2019-06-04 (×3): qty 20.3

## 2019-06-04 MED ORDER — EPTIFIBATIDE 75 MG/100ML IV SOLN
0.5000 ug/kg/min | INTRAVENOUS | Status: DC
  Filled 2019-06-04: qty 100

## 2019-06-04 MED ORDER — SODIUM CHLORIDE 0.9% FLUSH: 3.0000 mL | Freq: Once | INTRAVENOUS | Status: DC

## 2019-06-04 MED ORDER — CEFAZOLIN SODIUM-DEXTROSE 2-4 GM/100ML-% IV SOLN
INTRAVENOUS | Status: AC
  Filled 2019-06-04: qty 100

## 2019-06-04 MED ORDER — IOHEXOL 300 MG/ML  SOLN
50.0000 mL | Freq: Once | INTRAMUSCULAR | Status: AC | PRN
  Administered 2019-06-04: 5 mL via INTRA_ARTERIAL

## 2019-06-04 MED ORDER — CLOPIDOGREL BISULFATE 300 MG PO TABS
ORAL_TABLET | ORAL | Status: AC
  Filled 2019-06-04: qty 1

## 2019-06-04 MED ORDER — ACETAMINOPHEN 650 MG RE SUPP: 650.0000 mg | RECTAL | Status: DC | PRN

## 2019-06-04 MED ORDER — EPTIFIBATIDE 75 MG/100ML IV SOLN: 0.5000 ug/kg/min | INTRAVENOUS | Status: DC

## 2019-06-04 MED ORDER — TICAGRELOR 90 MG PO TABS
180.0000 mg | ORAL_TABLET | Freq: Once | ORAL | Status: DC
  Filled 2019-06-04: qty 2

## 2019-06-04 MED ORDER — ACETAMINOPHEN 325 MG PO TABS
650.0000 mg | ORAL_TABLET | ORAL | Status: DC | PRN
  Administered 2019-06-06 – 2019-06-09 (×5): 650 mg via ORAL
  Filled 2019-06-04 (×5): qty 2

## 2019-06-04 MED ORDER — TICAGRELOR 90 MG PO TABS: 90.0000 mg | ORAL_TABLET | Freq: Two times a day (BID) | ORAL | Status: DC

## 2019-06-04 MED ORDER — PHENYLEPHRINE 40 MCG/ML (10ML) SYRINGE FOR IV PUSH (FOR BLOOD PRESSURE SUPPORT)
PREFILLED_SYRINGE | INTRAVENOUS | Status: DC | PRN
  Administered 2019-06-04: 80 ug via INTRAVENOUS

## 2019-06-04 MED ORDER — ROCURONIUM BROMIDE 50 MG/5ML IV SOSY
PREFILLED_SYRINGE | INTRAVENOUS | Status: DC | PRN
  Administered 2019-06-04: 50 mg via INTRAVENOUS

## 2019-06-04 MED ORDER — ASPIRIN 300 MG RE SUPP
300.0000 mg | Freq: Every day | RECTAL | Status: DC
  Administered 2019-06-04: 300 mg via RECTAL
  Filled 2019-06-04: qty 1

## 2019-06-04 MED ORDER — IOHEXOL 350 MG/ML SOLN
100.0000 mL | Freq: Once | INTRAVENOUS | Status: AC | PRN
  Administered 2019-06-04: 100 mL via INTRAVENOUS

## 2019-06-04 MED ORDER — TIROFIBAN HCL IN NACL 5-0.9 MG/100ML-% IV SOLN
INTRAVENOUS | Status: AC
  Filled 2019-06-04: qty 100

## 2019-06-04 MED ORDER — ONDANSETRON HCL 4 MG/2ML IJ SOLN
INTRAMUSCULAR | Status: DC | PRN
  Administered 2019-06-04: 4 mg via INTRAVENOUS

## 2019-06-04 MED ORDER — EPTIFIBATIDE 20 MG/10ML IV SOLN
INTRAVENOUS | Status: DC | PRN
  Administered 2019-06-04: 19 mg

## 2019-06-04 MED ORDER — TICAGRELOR 90 MG PO TABS
180.0000 mg | ORAL_TABLET | Freq: Once | ORAL | Status: AC
  Administered 2019-06-04: 180 mg

## 2019-06-04 MED ORDER — VERAPAMIL HCL 2.5 MG/ML IV SOLN
INTRAVENOUS | Status: DC | PRN
  Administered 2019-06-04 (×2): 5 mg via INTRA_ARTERIAL

## 2019-06-04 MED ORDER — ASPIRIN 325 MG PO TABS: 325.0000 mg | ORAL_TABLET | Freq: Every day | ORAL | Status: DC

## 2019-06-04 MED ORDER — LIDOCAINE 2% (20 MG/ML) 5 ML SYRINGE
INTRAMUSCULAR | Status: DC | PRN
  Administered 2019-06-04: 60 mg via INTRAVENOUS

## 2019-06-04 MED ORDER — ASPIRIN 81 MG PO CHEW
CHEWABLE_TABLET | ORAL | Status: AC
  Filled 2019-06-04: qty 1

## 2019-06-04 MED ORDER — LACTATED RINGERS IV SOLN: INTRAVENOUS | Status: DC | PRN

## 2019-06-04 MED ORDER — PHENYLEPHRINE HCL-NACL 10-0.9 MG/250ML-% IV SOLN
INTRAVENOUS | Status: DC | PRN
  Administered 2019-06-04: 30 ug/min via INTRAVENOUS

## 2019-06-04 MED ORDER — PHENOL 1.4 % MT LIQD
1.0000 | OROMUCOSAL | Status: DC | PRN
  Administered 2019-06-04: 17:00:00 1 via OROMUCOSAL
  Filled 2019-06-04: qty 177

## 2019-06-04 MED ORDER — CLEVIDIPINE BUTYRATE 0.5 MG/ML IV EMUL
INTRAVENOUS | Status: AC
  Filled 2019-06-04: qty 50

## 2019-06-04 MED ORDER — VERAPAMIL HCL 2.5 MG/ML IV SOLN
INTRAVENOUS | Status: AC
  Filled 2019-06-04: qty 2

## 2019-06-04 MED ORDER — FENTANYL CITRATE (PF) 100 MCG/2ML IJ SOLN
INTRAMUSCULAR | Status: DC | PRN
  Administered 2019-06-04 (×2): 50 ug via INTRAVENOUS

## 2019-06-04 MED ORDER — ASPIRIN 81 MG PO CHEW: 324.0000 mg | CHEWABLE_TABLET | Freq: Once | ORAL | Status: DC

## 2019-06-04 MED ORDER — NICARDIPINE HCL IN NACL 20-0.86 MG/200ML-% IV SOLN: 0.0000 mg/h | INTRAVENOUS | Status: DC

## 2019-06-04 MED ORDER — SUGAMMADEX SODIUM 200 MG/2ML IV SOLN
INTRAVENOUS | Status: DC | PRN
  Administered 2019-06-04: 300 mg via INTRAVENOUS

## 2019-06-04 MED ORDER — SODIUM CHLORIDE 0.9 % IV SOLN: INTRAVENOUS | Status: DC

## 2019-06-04 MED ORDER — CHLORHEXIDINE GLUCONATE CLOTH 2 % EX PADS
6.0000 | MEDICATED_PAD | Freq: Every day | CUTANEOUS | Status: DC
  Administered 2019-06-04 – 2019-06-09 (×6): 6 via TOPICAL

## 2019-06-04 MED ORDER — EPTIFIBATIDE 20 MG/10ML IV SOLN
INTRAVENOUS | Status: AC
  Filled 2019-06-04: qty 10

## 2019-06-04 MED ORDER — IOHEXOL 240 MG/ML SOLN
INTRAMUSCULAR | Status: AC
  Administered 2019-06-04: 75 mL
  Filled 2019-06-04: qty 200

## 2019-06-04 MED ORDER — PROPOFOL 10 MG/ML IV BOLUS
INTRAVENOUS | Status: DC | PRN
  Administered 2019-06-04: 170 mg via INTRAVENOUS

## 2019-06-04 MED ORDER — SODIUM CHLORIDE 0.9 % IV SOLN: INTRAVENOUS | Status: DC | PRN

## 2019-06-04 MED ORDER — EPTIFIBATIDE 75 MG/100ML IV SOLN: INTRAVENOUS | Status: DC | PRN

## 2019-06-04 MED ORDER — STROKE: EARLY STAGES OF RECOVERY BOOK
Freq: Once | Status: DC
  Filled 2019-06-04 (×2): qty 1

## 2019-06-04 MED ORDER — SUCCINYLCHOLINE CHLORIDE 200 MG/10ML IV SOSY
PREFILLED_SYRINGE | INTRAVENOUS | Status: DC | PRN
  Administered 2019-06-04: 140 mg via INTRAVENOUS

## 2019-06-04 MED ORDER — TICAGRELOR 90 MG PO TABS
ORAL_TABLET | ORAL | Status: AC
  Filled 2019-06-04: qty 2

## 2019-06-04 MED ORDER — TICAGRELOR 90 MG PO TABS
90.0000 mg | ORAL_TABLET | Freq: Two times a day (BID) | ORAL | Status: DC
  Administered 2019-06-05 – 2019-06-06 (×4): 90 mg
  Filled 2019-06-04 (×6): qty 1

## 2019-06-04 NOTE — Progress Notes (Signed)
Per Aly, IR PA - CT schedule for 3/15 @ 1830. Please page Dr. Debbrah Alar after this is completed @ [phone].

## 2019-06-04 NOTE — Progress Notes (Signed)
[place] visited with Scott Day and Scott Day's gf in response to PG to the ED. Upon arrival, Scott Day's gf Jeani Hawking was in the room waiting for patient to return from [place]. Lyn reported that Scott Day did not want to believe that he was having a stroke this morning, and wanted to go to work instead, even with symptoms. Jeani Hawking reported that she brought him into the ED recognizing the symptoms clearly. [place] stayed with Scott Day and Scott Day's gf for an hour, until Scott Day was ready to be moved to [facility] for IR. Jeani Hawking was grateful for Ch's presence and support.

## 2019-06-04 NOTE — Code Documentation (Signed)
59 yo male with hx of colon cancer, GERD, and radial fracture coming from [facility] with complaints of new onset of left sided weakness and right sided gaze. Pt was LKW at 0400 when he went to sleep. When pt woke up this morning, he noted weakness and vision problems. Arrived to [facility] and called a Code Stroke.   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 Transferred by [facility].   Upon arrival to [facility], NIHSS 10 due to left arm and leg drift, left hemianopsia, right gaze, left sensory neglect, and dysarthria. Registered and patient taken straight to [place] [facility]. Arrived at 61. Intubated on stretcher and moved to IR suite - Arrived at 1305.

## 2019-06-04 NOTE — Anesthesia Preprocedure Evaluation (Signed)
Anesthesia Evaluation  Patient identified by MRN, date of birth, ID band Patient awake    Reviewed: Allergy & Precautions, NPO status , Patient's Chart, lab work & pertinent test results  Airway Mallampati: III  TM Distance: >3 FB     Dental   Pulmonary former smoker,    breath sounds clear to auscultation       Cardiovascular  Rhythm:Regular Rate:Tachycardia     Neuro/Psych    GI/Hepatic   Endo/Other    Renal/GU      Musculoskeletal   Abdominal   Peds  Hematology   Anesthesia Other Findings Answers questions, mild dysarthria  Reproductive/Obstetrics                             Anesthesia Physical Anesthesia Plan  ASA: IV and emergent  Anesthesia Plan: General   Post-op Pain Management:    Induction: Intravenous, Rapid sequence and Cricoid pressure planned  PONV Risk Score and Plan: Ondansetron  Airway Management Planned: Oral ETT  Additional Equipment:   Intra-op Plan:   Post-operative Plan:   Informed Consent: I have reviewed the patients History and Physical, chart, labs and discussed the procedure including the risks, benefits and alternatives for the proposed anesthesia with the patient or authorized representative who has indicated his/her understanding and acceptance.       Plan Discussed with: CRNA and Anesthesiologist  Anesthesia Plan Comments:         Anesthesia Quick Evaluation

## 2019-06-04 NOTE — Sedation Documentation (Signed)
Right groin sheath removed. 76F Perclose closure device used.

## 2019-06-04 NOTE — Procedures (Signed)
NEUROINTERVENTIONAL RADIOLOGY  Procedure: Diagnostic cerebral angiogram + mechanical thrombectomy  Indication: Right M1/MCA occlusion  Anesthesia: General  Access: Right common femoral artery (71F)  Closure: Perclose perglide  Medications: Intra arterial Integrilin bolus (19 mg), intra arteria verapamil 10 mg.  Blood loss: 200 mL  Specimen: none  Complication: none  Findings: There was a mid/distal right M1/MCA occlusion. Mechanical thrombectomy performed x2 with clot removal and restoration of flow (TICI 2C). Dissection flap identified at the site of occlusion. Follow-up angiogram shoed reocclusion. Flat panel CT performed with no hemorrhagic complication. Intracranial stenting performed across dissection flap in the right M1 segment with complete restoration of flow (TICI 3). Follow-up angiogram showed stable stent patency.  Plan: Continue low dose Integrilin drip. Head CT at 6:30 to evaluate for stroke burden and hemorrhagic complication. Load blilinta 180 + ASA 600 if CT clean.  Family (daughter) communicated immediately after the procedure.

## 2019-06-04 NOTE — Transfer of Care (Signed)
Immediate Anesthesia Transfer of Care Note  Patient: Scott Day  Procedure(s) Performed: IR WITH ANESTHESIA (N/A )  Patient Location: ICU  Anesthesia Type:General  Level of Consciousness: drowsy  Airway & Oxygen Therapy: Patient Spontanous Breathing and Patient connected to face mask oxygen  Post-op Assessment: Report given to RN and Post -op Vital signs reviewed and stable  Post vital signs: Reviewed and stable  Last Vitals:  Vitals Value Taken Time  BP 154/90 06/04/19 1515  Temp    Pulse 84 06/04/19 1526  Resp 16 06/04/19 1526  SpO2 94 % 06/04/19 1526  Vitals shown include unvalidated device data.  Last Pain:  Vitals:   06/04/19 1500  TempSrc: Axillary         Complications: No apparent anesthesia complications

## 2019-06-04 NOTE — ED Notes (Signed)
CBG 210 

## 2019-06-04 NOTE — Consult Note (Signed)
Requesting Physician: Jimmye Norman    Chief Complaint: Left sided weakness  I have been asked by Dr. Jimmye Norman to see this patient in consultation for code stroke.  HPI: KIROS BISCHOFF is an 59 y.o. male with a history of colon cancer in 2012 who reports going to bed this morning at baseline.  Awakened to go to work and noted left sided weakness.  With no improvement in symptoms was encouraged to come in for evaluation.  Initial NIHSS of 10.    Date last known well: 06/04/2019 Time last known well: Time: 04:00 tPA Given: No: Outside time window  Past Medical History:  Diagnosis Date  . Cancer [facility]) 2012   colon cancer  . Closed fracture of left distal radius   . GERD (gastroesophageal reflux disease)     Past Surgical History:  Procedure Laterality Date  . COLON SURGERY  2012   colon cancer  . OPEN REDUCTION INTERNAL FIXATION (ORIF) DISTAL RADIAL FRACTURE Left 09/20/2017   Procedure: OPEN REDUCTION INTERNAL FIXATION (ORIF) DISTAL RADIAL FRACTURE;  Surgeon: Leanora Cover, MD;  Location: [place];  Service: Orthopedics;  Laterality: Left;    Family history: Father deceased with MI. Mother alive with HTN   Social History: Patient is a smoker.  Reports no history of illicit drug abuse.    Allergies: No Known Allergies  Medications: I have reviewed the patient's current medications. Prior to Admission medications   Medication Sig Start Date End Date Taking? Authorizing Provider  HYDROcodone-acetaminophen (NORCO) 5-325 MG tablet 1-2 tabs po q6 hours prn pain Patient not taking: Reported on 06/04/2019 09/20/17   Leanora Cover, MD    ROS: History obtained from the patient  General ROS: negative for - chills, fatigue, fever, night sweats, weight gain or weight loss Psychological ROS: negative for - behavioral disorder, hallucinations, memory difficulties, mood swings or suicidal ideation Ophthalmic ROS: negative for - blurry vision, double vision, eye pain or loss of  vision ENT ROS: negative for - epistaxis, nasal discharge, oral lesions, sore throat, tinnitus or vertigo Allergy and Immunology ROS: negative for - hives or itchy/watery eyes Hematological and Lymphatic ROS: negative for - bleeding problems, bruising or swollen lymph nodes Endocrine ROS: negative for - galactorrhea, hair pattern changes, polydipsia/polyuria or temperature intolerance Respiratory ROS: negative for - cough, hemoptysis, shortness of breath or wheezing Cardiovascular ROS: negative for - chest pain, dyspnea on exertion, edema or irregular heartbeat Gastrointestinal ROS: negative for - abdominal pain, diarrhea, hematemesis, nausea/vomiting or stool incontinence Genito-Urinary ROS: negative for - dysuria, hematuria, incontinence or urinary frequency/urgency Musculoskeletal ROS: negative for - joint swelling or muscular weakness Neurological ROS: as noted in HPI Dermatological ROS: negative for rash and skin lesion changes   Physical Examination: Blood pressure (!) 142/80, pulse 91, temperature 98.4 F (36.9 C), temperature source Oral, resp. rate (!) 23, weight 105.2 kg, SpO2 97 %.  HEENT-  Normocephalic, no lesions, without obvious abnormality.  Normal external eye and conjunctiva.  Normal TM's bilaterally.  Normal auditory canals and external ears. Normal external nose, mucus membranes and septum.  Normal pharynx. Cardiovascular- S1, S2 normal, pulses palpable throughout   Lungs- chest clear, no wheezing, rales, normal symmetric air entry Abdomen- soft, non-tender; bowel sounds normal; no masses,  no organomegaly Extremities- no edema Lymph-no adenopathy palpable Musculoskeletal-no joint tenderness, deformity or swelling Skin-warm and dry, no hyperpigmentation, vitiligo, or suspicious lesions  Neurological Examination   Mental Status: Alert, oriented, thought content appropriate.  Speech fluent without evidence of aphasia.  Some mild dysarthria noted.  Able to follow 3 step  commands without difficulty. Cranial Nerves: II: Discs flat bilaterally; LHH III,IV, VI: Right gaze deviation.  Unable to go beyond midline to the left V,VII: Right facial droop.  Facial sensation intact VIII: hearing normal bilaterally IX,X: gag reflex reduced XI: bilateral shoulder shrug XII: midline tongue extension Motor: Right : Upper extremity   5/5    Left:     Upper extremity   4-/5  Lower extremity   5/5     Lower extremity   4+/5 Tone and bulk:normal tone throughout; no atrophy noted Sensory: Pinprick and light touch intact throughout, bilaterally Deep Tendon Reflexes: Symmetric throughout Plantars: Right: mute   Left: mute Cerebellar: Normal finger-to-nose and normal heel-to-shin testing on the left Gait: not tested due to safety concerns    Laboratory Studies:  Basic Metabolic Panel: No results for input(s): NA, K, CL, CO2, GLUCOSE, BUN, CREATININE, CALCIUM, MG, PHOS in the last 168 hours.  Liver Function Tests: No results for input(s): AST, ALT, ALKPHOS, BILITOT, PROT, ALBUMIN in the last 168 hours. No results for input(s): LIPASE, AMYLASE in the last 168 hours. No results for input(s): AMMONIA in the last 168 hours.  CBC: Recent Labs  Lab 06/04/19 1125  WBC 8.4  NEUTROABS 6.7  HGB 15.2  HCT 44.3  MCV 92.3  PLT 216    Cardiac Enzymes: No results for input(s): CKTOTAL, CKMB, CKMBINDEX, TROPONINI in the last 168 hours.  BNP: Invalid input(s): POCBNP  CBG: No results for input(s): GLUCAP in the last 168 hours.  Microbiology: No results found for this or any previous visit.  Coagulation Studies: No results for input(s): LABPROT, INR in the last 72 hours.  Urinalysis: No results for input(s): COLORURINE, LABSPEC, PHURINE, GLUCOSEU, HGBUR, BILIRUBINUR, KETONESUR, PROTEINUR, UROBILINOGEN, NITRITE, LEUKOCYTESUR in the last 168 hours.  Invalid input(s): APPERANCEUR  Lipid Panel: No results found for: CHOL, TRIG, HDL, CHOLHDL, VLDL, LDLCALC  HgbA1C:  No results found for: HGBA1C  Urine Drug Screen:  No results found for: LABOPIA, COCAINSCRNUR, LABBENZ, AMPHETMU, THCU, LABBARB  Alcohol Level: No results for input(s): ETH in the last 168 hours.  Other results: EKG: sinus rhythm at 93 bpm.  Imaging: CT ANGIO HEAD W OR WO CONTRAST  Result Date: 06/04/2019 CLINICAL DATA:  Code stroke.  Slurred speech and left facial droop EXAM: CT ANGIOGRAPHY HEAD AND NECK CT PERFUSION BRAIN TECHNIQUE: Multidetector CT imaging of the head and neck was performed using the standard protocol during bolus administration of intravenous contrast. Multiplanar CT image reconstructions and MIPs were obtained to evaluate the vascular anatomy. Carotid stenosis measurements (when applicable) are obtained utilizing NASCET criteria, using the distal internal carotid diameter as the denominator. Multiphase CT imaging of the brain was performed following IV bolus contrast injection. Subsequent parametric perfusion maps were calculated using RAPID software. COMPARISON:  None. FINDINGS: CT HEAD Brain: There is no acute intracranial hemorrhage mass effect edema. Gray-white differentiation is preserved. Ventricles and sulci normal in size and configuration. There is no extra-axial fluid collection. Vascular: Hyperdense vessel. Skull: Unremarkable. Sinuses/Orbits: Lobular mucosal thickening with greatest involvement of the right maxillary sinus. No acute orbital finding. Other: Mastoid air cells are clear ASPECTS [facility] Stroke Program Early CT Score) - Ganglionic level infarction (caudate, lentiform nuclei, internal capsule, insula, M1-M3 cortex): 7 - Supraganglionic infarction (M4-M6 cortex): 3 Total score (0-10 with 10 being normal): 10 Review of the MIP images confirms the above findings CTA NECK Aortic arch: Great vessel origins are patent.  Right carotid system: Patent. There is trace calcified plaque at the ICA origin measurable stenosis. Left carotid system: Patent. Eccentric  noncalcified plaque causing less than 50% stenosis. Mild calcified plaque at the ICA origin causing less than 50% stenosis. Vertebral arteries: Patent.  Left vertebral artery dominant. Skeleton: Cervical spine degenerative changes. Other neck: No mass or adenopathy Upper chest: No apical lung mass. Review of the MIP images confirms the above findings CTA HEAD Anterior circulation: Intracranial internal carotid arteries patent. There is nonocclusive thrombus within distal right M1 MCA extending to the bifurcation. Left middle cerebral artery is patent. Anterior cerebral arteries are patent. Congenitally absent right A1 ACA. Posterior circulation: Intracranial vertebral arteries, basilar artery, and posterior cerebral arteries are patent. There are bilateral patent posterior communicating arteries. Venous sinuses: As permitted by contrast timing, patent. Review of the MIP images confirms the above findings CT Brain Perfusion: CBF (<30%) Volume: 67mL Perfusion (Tmax>6.0s) volume: 197mL Mismatch Volume: 128mL Infarction Location: Right MCA territory IMPRESSION: No acute intracranial hemorrhage or evidence of acute infarction. ASPECT score is 10. 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 Perfusion imaging demonstrates 25 mL core infarction and 126 mL territory at risk in the right MCA territory. No hemodynamically significant stenosis in the neck. These results were called by telephone at the time of interpretation on 06/04/2019 at 11:53 am to provider [facility] ; Alexis Goodell , who verbally acknowledged these results. Electronically Signed   By: Macy Mis M.D.   On: 06/04/2019 11:59   CT ANGIO NECK W OR WO CONTRAST  Result Date: 06/04/2019 CLINICAL DATA:  Code stroke.  Slurred speech and left facial droop EXAM: CT ANGIOGRAPHY HEAD AND NECK CT PERFUSION BRAIN TECHNIQUE: Multidetector CT imaging of the head and neck was performed using the standard protocol  during bolus administration of intravenous contrast. Multiplanar CT image reconstructions and MIPs were obtained to evaluate the vascular anatomy. Carotid stenosis measurements (when applicable) are obtained utilizing NASCET criteria, using the distal internal carotid diameter as the denominator. Multiphase CT imaging of the brain was performed following IV bolus contrast injection. Subsequent parametric perfusion maps were calculated using RAPID software. COMPARISON:  None. FINDINGS: CT HEAD Brain: There is no acute intracranial hemorrhage mass effect edema. Gray-white differentiation is preserved. Ventricles and sulci normal in size and configuration. There is no extra-axial fluid collection. Vascular: Hyperdense vessel. Skull: Unremarkable. Sinuses/Orbits: Lobular mucosal thickening with greatest involvement of the right maxillary sinus. No acute orbital finding. Other: Mastoid air cells are clear ASPECTS [facility] Stroke Program Early CT Score) - Ganglionic level infarction (caudate, lentiform nuclei, internal capsule, insula, M1-M3 cortex): 7 - Supraganglionic infarction (M4-M6 cortex): 3 Total score (0-10 with 10 being normal): 10 Review of the MIP images confirms the above findings CTA NECK Aortic arch: Great vessel origins are patent. Right carotid system: Patent. There is trace calcified plaque at the ICA origin measurable stenosis. Left carotid system: Patent. Eccentric noncalcified plaque causing less than 50% stenosis. Mild calcified plaque at the ICA origin causing less than 50% stenosis. Vertebral arteries: Patent.  Left vertebral artery dominant. Skeleton: Cervical spine degenerative changes. Other neck: No mass or adenopathy Upper chest: No apical lung mass. Review of the MIP images confirms the above findings CTA HEAD Anterior circulation: Intracranial internal carotid arteries patent. There is nonocclusive thrombus within distal right M1 MCA extending to the bifurcation. Left middle cerebral artery  is patent. Anterior cerebral arteries are patent. Congenitally absent right A1 ACA. Posterior circulation: Intracranial  vertebral arteries, basilar artery, and posterior cerebral arteries are patent. There are bilateral patent posterior communicating arteries. Venous sinuses: As permitted by contrast timing, patent. Review of the MIP images confirms the above findings CT Brain Perfusion: CBF (<30%) Volume: 46mL Perfusion (Tmax>6.0s) volume: 156mL Mismatch Volume: 166mL Infarction Location: Right MCA territory IMPRESSION: No acute intracranial hemorrhage or evidence of acute infarction. ASPECT score is 10. 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 Perfusion imaging demonstrates 25 mL core infarction and 126 mL territory at risk in the right MCA territory. No hemodynamically significant stenosis in the neck. These results were called by telephone at the time of interpretation on 06/04/2019 at 11:53 am to provider [facility] ; Alexis Goodell , who verbally acknowledged these results. Electronically Signed   By: Macy Mis M.D.   On: 06/04/2019 11:59   CT CEREBRAL PERFUSION W CONTRAST  Result Date: 06/04/2019 CLINICAL DATA:  Code stroke.  Slurred speech and left facial droop EXAM: CT ANGIOGRAPHY HEAD AND NECK CT PERFUSION BRAIN TECHNIQUE: Multidetector CT imaging of the head and neck was performed using the standard protocol during bolus administration of intravenous contrast. Multiplanar CT image reconstructions and MIPs were obtained to evaluate the vascular anatomy. Carotid stenosis measurements (when applicable) are obtained utilizing NASCET criteria, using the distal internal carotid diameter as the denominator. Multiphase CT imaging of the brain was performed following IV bolus contrast injection. Subsequent parametric perfusion maps were calculated using RAPID software. COMPARISON:  None. FINDINGS: CT HEAD Brain: There is no acute intracranial hemorrhage  mass effect edema. Gray-white differentiation is preserved. Ventricles and sulci normal in size and configuration. There is no extra-axial fluid collection. Vascular: Hyperdense vessel. Skull: Unremarkable. Sinuses/Orbits: Lobular mucosal thickening with greatest involvement of the right maxillary sinus. No acute orbital finding. Other: Mastoid air cells are clear ASPECTS [facility] Stroke Program Early CT Score) - Ganglionic level infarction (caudate, lentiform nuclei, internal capsule, insula, M1-M3 cortex): 7 - Supraganglionic infarction (M4-M6 cortex): 3 Total score (0-10 with 10 being normal): 10 Review of the MIP images confirms the above findings CTA NECK Aortic arch: Great vessel origins are patent. Right carotid system: Patent. There is trace calcified plaque at the ICA origin measurable stenosis. Left carotid system: Patent. Eccentric noncalcified plaque causing less than 50% stenosis. Mild calcified plaque at the ICA origin causing less than 50% stenosis. Vertebral arteries: Patent.  Left vertebral artery dominant. Skeleton: Cervical spine degenerative changes. Other neck: No mass or adenopathy Upper chest: No apical lung mass. Review of the MIP images confirms the above findings CTA HEAD Anterior circulation: Intracranial internal carotid arteries patent. There is nonocclusive thrombus within distal right M1 MCA extending to the bifurcation. Left middle cerebral artery is patent. Anterior cerebral arteries are patent. Congenitally absent right A1 ACA. Posterior circulation: Intracranial vertebral arteries, basilar artery, and posterior cerebral arteries are patent. There are bilateral patent posterior communicating arteries. Venous sinuses: As permitted by contrast timing, patent. Review of the MIP images confirms the above findings CT Brain Perfusion: CBF (<30%) Volume: 87mL Perfusion (Tmax>6.0s) volume: 158mL Mismatch Volume: 139mL Infarction Location: Right MCA territory IMPRESSION: No acute  intracranial hemorrhage or evidence of acute infarction. ASPECT score is 10. 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 Perfusion imaging demonstrates 25 mL core infarction and 126 mL territory at risk in the right MCA territory. No hemodynamically significant stenosis in the neck. These results were called by telephone at the time of interpretation on 06/04/2019  at 11:53 am to provider Lenise Arena ; Alexis Goodell , who verbally acknowledged these results. Electronically Signed   By: Macy Mis M.D.   On: 06/04/2019 11:59   CT HEAD CODE STROKE WO CONTRAST  Result Date: 06/04/2019 CLINICAL DATA:  Code stroke.  Slurred speech and left facial droop EXAM: CT ANGIOGRAPHY HEAD AND NECK CT PERFUSION BRAIN TECHNIQUE: Multidetector CT imaging of the head and neck was performed using the standard protocol during bolus administration of intravenous contrast. Multiplanar CT image reconstructions and MIPs were obtained to evaluate the vascular anatomy. Carotid stenosis measurements (when applicable) are obtained utilizing NASCET criteria, using the distal internal carotid diameter as the denominator. Multiphase CT imaging of the brain was performed following IV bolus contrast injection. Subsequent parametric perfusion maps were calculated using RAPID software. COMPARISON:  None. FINDINGS: CT HEAD Brain: There is no acute intracranial hemorrhage mass effect edema. Gray-white differentiation is preserved. Ventricles and sulci normal in size and configuration. There is no extra-axial fluid collection. Vascular: Hyperdense vessel. Skull: Unremarkable. Sinuses/Orbits: Lobular mucosal thickening with greatest involvement of the right maxillary sinus. No acute orbital finding. Other: Mastoid air cells are clear ASPECTS [facility] Stroke Program Early CT Score) - Ganglionic level infarction (caudate, lentiform nuclei, internal capsule, insula, M1-M3 cortex): 7 - Supraganglionic  infarction (M4-M6 cortex): 3 Total score (0-10 with 10 being normal): 10 Review of the MIP images confirms the above findings CTA NECK Aortic arch: Great vessel origins are patent. Right carotid system: Patent. There is trace calcified plaque at the ICA origin measurable stenosis. Left carotid system: Patent. Eccentric noncalcified plaque causing less than 50% stenosis. Mild calcified plaque at the ICA origin causing less than 50% stenosis. Vertebral arteries: Patent.  Left vertebral artery dominant. Skeleton: Cervical spine degenerative changes. Other neck: No mass or adenopathy Upper chest: No apical lung mass. Review of the MIP images confirms the above findings CTA HEAD Anterior circulation: Intracranial internal carotid arteries patent. There is nonocclusive thrombus within distal right M1 MCA extending to the bifurcation. Left middle cerebral artery is patent. Anterior cerebral arteries are patent. Congenitally absent right A1 ACA. Posterior circulation: Intracranial vertebral arteries, basilar artery, and posterior cerebral arteries are patent. There are bilateral patent posterior communicating arteries. Venous sinuses: As permitted by contrast timing, patent. Review of the MIP images confirms the above findings CT Brain Perfusion: CBF (<30%) Volume: 44mL Perfusion (Tmax>6.0s) volume: 130mL Mismatch Volume: 163mL Infarction Location: Right MCA territory IMPRESSION: No acute intracranial hemorrhage or evidence of acute infarction. ASPECT score is 10. 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 Perfusion imaging demonstrates 25 mL core infarction and 126 mL territory at risk in the right MCA territory. No hemodynamically significant stenosis in the neck. These results were called by telephone at the time of interpretation on 06/04/2019 at 11:53 am to provider [facility] ; Alexis Goodell , who verbally acknowledged these results. Electronically Signed   By: Macy Mis M.D.   On: 06/04/2019 11:59    Assessment: 59 y.o. male with a history of colon cancer and smoking who presents with left sided weakness, mild dysarthria, right gaze preference and LHH.  Acute infarct suspected.  Patient outside time window for tPA.  Head CT personally reviewed and shows no evidence of acute infarct but hyperdense right MCA noted.  CTA personally reviewed and shows right M1 thrombus with extension to the bifurcation.  Perfusion studies showed significant penumbra.  Thrombectomy procedure explained to patient and girlfriend.  Verbal  consent obtained.  Family to meet patient at [facility].  [facility] neurology contacted and patient to be transferred to [facility] for possible thrombectomy.    Stroke Risk Factors - smoking  Plan: 1. ASA now. 2. Transfer to [facility] for thrombectomy.  Dr. Leonel Ramsay aware.   Case discussed with Dr. Simon Rhein, MD Neurology [phone] 06/04/2019, 12:13 PM

## 2019-06-04 NOTE — Progress Notes (Signed)
Per RN report, integrelin to be discontinued once Brilinta & Asprin were given. Clarified this with Dr. Karenann Cai.   NG placed and verified via Xray, Brilinta and Asprin given, Integrelin has been D/C'd at this time. WCTM

## 2019-06-04 NOTE — ED Provider Notes (Signed)
[facility] Emergency Department Provider Note       Time seen: ----------------------------------------- 11:47 AM on 06/04/2019 -----------------------------------------   I have reviewed the triage vital signs and the nursing notes.  HISTORY   Chief Complaint Code Stroke    HPI Scott Day is a 59 y.o. male with a history of colon cancer who presents to the ED for left-sided weakness.  He woke up this morning with was unable to use the left side of his body.  He was noted to have slurred speech and facial droop.  He went to bed normally at 2 AM.  Girlfriend states that he still try to go to work this morning but he has almost no use of his left arm.  Patient does have some difficulty walking and some weakness in the left leg.  He has left facial droop as well.  Past Medical History:  Diagnosis Date  . Cancer [facility]) 2012   colon cancer  . Closed fracture of left distal radius   . GERD (gastroesophageal reflux disease)     There are no problems to display for this patient.   Past Surgical History:  Procedure Laterality Date  . COLON SURGERY  2012   colon cancer  . OPEN REDUCTION INTERNAL FIXATION (ORIF) DISTAL RADIAL FRACTURE Left 09/20/2017   Procedure: OPEN REDUCTION INTERNAL FIXATION (ORIF) DISTAL RADIAL FRACTURE;  Surgeon: Leanora Cover, MD;  Location: [place];  Service: Orthopedics;  Laterality: Left;    Allergies Patient has no known allergies.  Social History Social History   Tobacco Use  . Smoking status: Former Research scientist (life sciences)  . Smokeless tobacco: Never Used  Substance Use Topics  . Alcohol use: Yes    Comment: social  . Drug use: Never    Review of Systems Constitutional: Negative for fever. Cardiovascular: Negative for chest pain. Respiratory: Negative for shortness of breath. Gastrointestinal: Negative for abdominal pain, vomiting and diarrhea. Musculoskeletal: Negative for back pain. Skin: Negative for  rash. Neurological: Positive for left-sided weakness, facial droop, slurred speech  All systems negative/normal/unremarkable except as stated in the HPI  ____________________________________________   PHYSICAL EXAM:  VITAL SIGNS: ED Triage Vitals  Enc Vitals Group     BP 06/04/19 1125 (!) 142/80     Pulse Rate 06/04/19 1125 94     Resp 06/04/19 1125 20     Temp 06/04/19 1125 98.4 F (36.9 C)     Temp Source 06/04/19 1125 Oral     SpO2 06/04/19 1125 98 %     Weight --      Height --      Head Circumference --      Peak Flow --      Pain Score 06/04/19 1140 0     Pain Loc --      Pain Edu? --      Excl. in [place]? --     Constitutional: Alert and oriented.  Mild distress Eyes: Conjunctivae are injected. Normal extraocular movements.  Left pupil appears to be sluggish ENT      Head: Normocephalic and atraumatic.      Nose: No congestion/rhinnorhea.      Mouth/Throat: Mucous membranes are moist.      Neck: No stridor. Cardiovascular: Normal rate, regular rhythm. No murmurs, rubs, or gallops. Respiratory: Normal respiratory effort without tachypnea nor retractions. Breath sounds are clear and equal bilaterally. No wheezes/rales/rhonchi. Gastrointestinal: Soft and nontender. Normal bowel sounds Musculoskeletal: Nontender with normal range of motion in extremities. No  lower extremity tenderness nor edema. Neurologic: Patient seems somewhat confused, left lower facial droop is noted, forehead is spared.  Left arm weakness with minimal left arm strength.  Left leg with slight weakness compared to right. Skin:  Skin is warm, dry and intact. No rash noted. Psychiatric: Mood and affect are normal. Speech and behavior are normal.  ____________________________________________  EKG: Interpreted by me.  Sinus rhythm with rate of 93 bpm, wide QRS, normal axis, borderline long QT  ____________________________________________  ED COURSE:  As part of my medical decision making, I reviewed  the following data within the [place] History obtained from family if available, nursing notes, old chart and ekg, as well as notes from prior ED visits. Patient presented for acute CVA symptoms, we will assess with labs and imaging as indicated at this time.   Procedures  Scott Day was evaluated in Emergency Department on 06/04/2019 for the symptoms described in the history of present illness. He was evaluated in the context of the global COVID-19 pandemic, which necessitated consideration that the patient might be at risk for infection with the SARS-CoV-2 virus that causes COVID-19. Institutional protocols and algorithms that pertain to the evaluation of patients at risk for COVID-19 are in a state of rapid change based on information released by regulatory bodies including the CDC and federal and state organizations. These policies and algorithms were followed during the patient's care in the ED.  ____________________________________________   LABS (pertinent positives/negatives)  Labs Reviewed  CBC  DIFFERENTIAL  PROTIME-INR  APTT  COMPREHENSIVE METABOLIC PANEL  URINE DRUG SCREEN, QUALITATIVE ([place])  ETHANOL  I-STAT CREATININE, ED  CBG MONITORING, ED   CRITICAL CARE Performed by: Laurence Aly   Total critical care time: 30 minutes  Critical care time was exclusive of separately billable procedures and treating other patients.  Critical care was necessary to treat or prevent imminent or life-threatening deterioration.  Critical care was time spent personally by me on the following activities: development of treatment plan with patient and/or surrogate as well as nursing, discussions with consultants, evaluation of patient's response to treatment, examination of patient, obtaining history from patient or surrogate, ordering and performing treatments and interventions, ordering and review of laboratory studies, ordering and review of radiographic  studies, pulse oximetry and re-evaluation of patient's condition.  RADIOLOGY Images were viewed by me CT perfusion study, CT angiogram IMPRESSION: No acute intracranial hemorrhage or evidence of acute infarction. ASPECT score is 10.  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 Perfusion imaging demonstrates 25 mL core infarction and 126 mL territory at risk in the right MCA territory.  No hemodynamically significant stenosis in the neck.  These results were called by telephone at the time of interpretation on 06/04/2019 at 11:53 am to provider [facility] ; Alexis Goodell , who verbally acknowledged these results.   ____________________________________________   DIFFERENTIAL DIAGNOSIS   CVA, TIA, hypoglycemia  FINAL ASSESSMENT AND PLAN  Acute CVA   Plan: The patient had presented for acute left-sided weakness noted upon awakening this morning. Patient's labs are still pending at this time. Patient's imaging did reveal a nonocclusive thrombus in the distal right M1 MCA extending to the bifurcation.  Patient was seen by neurology and decision was made to transfer to [facility] for possible thrombectomy.  We will give aspirin and currently he appears stable for transfer.   Laurence Aly, MD    Note: This note was  generated in part or whole with voice recognition software. Voice recognition is usually quite accurate but there are transcription errors that can and very often do occur. I apologize for any typographical errors that were not detected and corrected.     Earleen Newport, MD 06/04/19 [phone]

## 2019-06-04 NOTE — ED Triage Notes (Signed)
Pt woke up this AM unable to use left side of body. Pt has slurred speech and facial droop. Pt went to bed normal at 4 AM.  VAN positive.

## 2019-06-04 NOTE — ED Triage Notes (Signed)
1126- cleared for CT by dr Joni Fears.   1127-in CT scan with RN  [id number]- dr Doy Mince with neuro at bedside

## 2019-06-04 NOTE — H&P (Signed)
Neurology H&P  CC: " My girlfriend made me come"  History is obtained from: Patient  HPI: Scott Day is a 59 y.o. male with a history of colon cancer in 2012 who presents with left-sided weakness that was present on awakening this morning.  He went to bed last night.  He woke this morning with left-sided weakness, but was not aware of it.  His girlfriend told him that something was wrong and made him come to the emergency department, they arrived by private vehicle to [facility].  There, he was noted to be van positive and a CTA/P was undertaken which demonstrated large penumbra.  Due to this penumbra, he was transferred to [facility] for emergent thrombectomy.  I attempted to call the only other number in the chart, but did not get an answer and therefore he was taken for emergent thrombectomy based on two-physician emergency consent(with Dr. Ladean Raya)    previously treated with chemotherapy (I think he is lost to follow-up).  He had surgical resection in 2012 as well as adjuvant FOLFOX chemotherapy.   LKW: 2 AM tpa given?: No, outside of window IR Thrombectomy?  Yes Modified Rankin Scale: 0-Completely asymptomatic and back to baseline post- stroke NIHSS: 10   ROS: A complete ROS was performed and is negative except as noted in the HPI.   Past Medical History:  Diagnosis Date  . Cancer [facility]) 2012   colon cancer  . Closed fracture of left distal radius   . GERD (gastroesophageal reflux disease)      History reviewed. No pertinent family history.  Social History:  reports that he has quit smoking. He has never used smokeless tobacco. He reports current alcohol use. He reports that he does not use drugs.   Prior to Admission medications   Medication Sig Start Date End Date Taking? Authorizing Provider  HYDROcodone-acetaminophen (NORCO) 5-325 MG tablet 1-2 tabs po q6 hours prn pain Patient not taking: Reported on 06/04/2019 09/20/17   Leanora Cover, MD      Exam: Current vital signs: There were no vitals filed for this visit.  Physical Exam  Constitutional: Appears well-developed and well-nourished.  Psych: Affect appropriate to situation Eyes: No scleral injection HENT: No OP obstrucion Head: Normocephalic.  Cardiovascular: Normal rate and regular rhythm.  Respiratory: Effort normal and breath sounds normal to anterior ascultation GI: Soft.  No distension. There is no tenderness.  Skin: WDI  Neuro: Mental Status: Patient is awake, alert, oriented to person, place, month, year, and situation. Patient is able to give a clear and coherent history. No signs of aphasia  He has a dense left hemineglect Cranial Nerves: II: Left homonymous hemianopia his left pupil is slightly larger than the right, both are reactive III,IV, VI: He has a forced rightward gaze and is unable to cross midline to the left V: Facial sensation is diminished on the left VII: Facial movement is weak on the left Motor: He has mild 4/5 weakness of the left arm and leg, with mild drift.   Sensory: Sensation is markedly diminished on the left side, he does not since being touched, with strong noxious stimulation he does respond and correctly identify the side that is affected. 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   Secondary Diagnosis: Type 2 diabetes mellitus with hyperglycemia    Impression: 59 year old male with right MCA stroke who is within the timeframe for innervation based on CT perfusion.  Currently, it appears that he is a candidate and he is going for emergent thrombectomy.  Of note, though I do not see a diagnosis of diabetes, and he does not take any medications, he  has had 2 random glucose is greater than 200 and therefore should be considered diabetic.  Plan: - HgbA1c, fasting lipid panel - MRI  of the brain without contrast - Frequent neuro checks - Echocardiogram - Prophylactic therapy-Antiplatelet med: Aspirin - dose 325mg  PO or 300mg  PR - Risk factor modification - Telemetry monitoring - PT consult, OT consult, Speech consult - Stroke team to follow   This patient is critically ill and at significant risk of neurological worsening, death and care requires constant monitoring of vital signs, hemodynamics,respiratory and cardiac monitoring, neurological assessment, discussion with family, other specialists and medical decision making of high complexity. I spent 55 minutes of neurocritical care time  in the care of  this patient. This was time spent independent of any time provided by nurse practitioner or PA.  Roland Rack, MD [facility] [phone]  If 7pm- 7am, please page neurology on call as listed in [place].

## 2019-06-04 NOTE — Progress Notes (Signed)
Patient wallet, money, pocket knife locked in security box. Yellow form transported to 4N and given to unit secretary to place in patient shadow chart. Patient clothing jeans, belt, and tennis shoes taken to 4N ICU room 31.

## 2019-06-04 NOTE — Progress Notes (Signed)
CODE STROKE- PHARMACY COMMUNICATION   Time CODE STROKE called/page received: 1117  Time response to CODE STROKE was made (in person or via phone): 1140  Time Stroke Kit retrieved from [place] (only if needed): NA   Name of Provider/Nurse contacted: MD, RN at bedside (patient outside tPA window, LKN ~ 0400)    Tawnya Crook ,PharmD Clinical Pharmacist  06/04/2019  11:57 AM

## 2019-06-04 NOTE — Anesthesia Procedure Notes (Signed)
Procedure Name: Intubation Date/Time: 06/04/2019 12:59 PM Performed by: Orlie Dakin, CRNA Pre-anesthesia Checklist: Patient identified, Emergency Drugs available, Suction available and Patient being monitored Patient Re-evaluated:Patient Re-evaluated prior to induction Oxygen Delivery Method: Circle system utilized Preoxygenation: Pre-oxygenation with 100% oxygen Induction Type: IV induction and Rapid sequence Laryngoscope Size: Glidescope and 4 Grade View: Grade I Tube type: Oral Tube size: 8.0 mm Number of attempts: 1 Airway Equipment and Method: Video-laryngoscopy and Stylet Placement Confirmation: ETT inserted through vocal cords under direct vision,  CO2 detector and breath sounds checked- equal and bilateral Secured at: 23 cm Tube secured with: Tape Dental Injury: Teeth and Oropharynx as per pre-operative assessment  Comments: Induction and intubation in Intubation Bay in IR due to unknown Covid status.

## 2019-06-04 NOTE — ED Notes (Signed)
EMTALA reviewed by charge RN 

## 2019-06-04 NOTE — Progress Notes (Signed)
PT Cancellation Note  Patient Details Name: Scott Day MRN: [id number] DOB: [birth year]   Cancelled Treatment:    Reason Eval/Treat Not Completed: Medical issues which prohibited therapy  Patient currently on flat bedrest x 6 hours (until ~9:45 pm)   Arby Barrette, PT Pager [phone]   Rexanne Mano 06/04/2019, 3:51 PM

## 2019-06-04 NOTE — ED Notes (Signed)
PIV started per dr Doy Mince will remain in CT for perfusion study

## 2019-06-04 NOTE — Anesthesia Postprocedure Evaluation (Signed)
Anesthesia Post Note  Patient: Scott Day  Procedure(s) Performed: IR WITH ANESTHESIA (N/A )     Patient location during evaluation: NICU Anesthesia Type: General Level of consciousness: awake and oriented Pain management: pain level controlled Vital Signs Assessment: post-procedure vital signs reviewed and stable Respiratory status: nonlabored ventilation and spontaneous breathing Cardiovascular status: blood pressure returned to baseline Anesthetic complications: no    Last Vitals:  Vitals:   06/04/19 1545 06/04/19 1600  BP: 130/90 (!) 145/79  Pulse: 79 73  Resp: 18 20  Temp:    SpO2: 93% 93%    Last Pain:  Vitals:   06/04/19 1500  TempSrc: Axillary  PainSc: 0-No pain                 Roneka Gilpin COKER

## 2019-06-04 NOTE — Plan of Care (Signed)
Pt dysarthric but able to communicate needs.

## 2019-06-05 ENCOUNTER — Inpatient Hospital Stay (HOSPITAL_COMMUNITY): Payer: Medicaid Other

## 2019-06-05 DIAGNOSIS — E7800 Pure hypercholesterolemia, unspecified: Secondary | ICD-10-CM

## 2019-06-05 DIAGNOSIS — R1312 Dysphagia, oropharyngeal phase: Secondary | ICD-10-CM

## 2019-06-05 DIAGNOSIS — E1165 Type 2 diabetes mellitus with hyperglycemia: Secondary | ICD-10-CM

## 2019-06-05 DIAGNOSIS — I63511 Cerebral infarction due to unspecified occlusion or stenosis of right middle cerebral artery: Secondary | ICD-10-CM

## 2019-06-05 LAB — CBC
HCT: 42.8 % (ref 39.0–52.0)
Hemoglobin: 14.6 g/dL (ref 13.0–17.0)
MCH: 32 pg (ref 26.0–34.0)
MCHC: 34.1 g/dL (ref 30.0–36.0)
MCV: 93.9 fL (ref 80.0–100.0)
Platelets: 212 10*3/uL (ref 150–400)
RBC: 4.56 MIL/uL (ref 4.22–5.81)
RDW: 12.6 % (ref 11.5–15.5)
WBC: 8 10*3/uL (ref 4.0–10.5)
nRBC: 0 % (ref 0.0–0.2)

## 2019-06-05 LAB — BASIC METABOLIC PANEL
Anion gap: 8 (ref 5–15)
BUN: 5 mg/dL — ABNORMAL LOW (ref 6–20)
CO2: 24 mmol/L (ref 22–32)
Calcium: 8.6 mg/dL — ABNORMAL LOW (ref 8.9–10.3)
Chloride: 108 mmol/L (ref 98–111)
Creatinine, Ser: 0.89 mg/dL (ref 0.61–1.24)
GFR calc Af Amer: >60 mL/min (ref >60)
GFR calc non Af Amer: >60 mL/min (ref >60)
Glucose, Bld: 144 mg/dL — ABNORMAL HIGH (ref 70–99)
Potassium: 3.8 mmol/L (ref 3.5–5.1)
Sodium: 140 mmol/L (ref 135–145)

## 2019-06-05 LAB — LIPID PANEL
Cholesterol: 180 mg/dL (ref 0–200)
HDL: 28 mg/dL — ABNORMAL LOW (ref >40)
LDL Cholesterol: 126 mg/dL — ABNORMAL HIGH (ref 0–99)
Total CHOL/HDL Ratio: 6.4 RATIO
Triglycerides: 131 mg/dL (ref <150)
VLDL: 26 mg/dL (ref 0–40)

## 2019-06-05 LAB — ECHOCARDIOGRAM COMPLETE
Height: 71 in
Weight: 3703.73 oz

## 2019-06-05 LAB — RAPID URINE DRUG SCREEN, HOSP PERFORMED
Amphetamines: NOT DETECTED
Barbiturates: NOT DETECTED
Benzodiazepines: NOT DETECTED
Cocaine: NOT DETECTED
Opiates: NOT DETECTED
Tetrahydrocannabinol: NOT DETECTED

## 2019-06-05 LAB — HEMOGLOBIN A1C
Hgb A1c MFr Bld: 8.3 % — ABNORMAL HIGH (ref 4.8–5.6)
Mean Plasma Glucose: 191.51 mg/dL

## 2019-06-05 LAB — GLUCOSE, CAPILLARY
Glucose-Capillary: 159 mg/dL — ABNORMAL HIGH (ref 70–99)
Glucose-Capillary: 129 mg/dL — ABNORMAL HIGH (ref 70–99)

## 2019-06-05 MED ORDER — ATORVASTATIN CALCIUM 80 MG PO TABS
80.0000 mg | ORAL_TABLET | Freq: Every day | ORAL | Status: DC
  Administered 2019-06-05 – 2019-06-06 (×2): 80 mg
  Filled 2019-06-05: qty 2
  Filled 2019-06-05: qty 1

## 2019-06-05 MED ORDER — INSULIN ASPART 100 UNIT/ML ~~LOC~~ SOLN
0.0000 [IU] | SUBCUTANEOUS | Status: DC
  Administered 2019-06-05 – 2019-06-07 (×8): 2 [IU] via SUBCUTANEOUS
  Administered 2019-06-07 – 2019-06-08 (×3): 3 [IU] via SUBCUTANEOUS
  Administered 2019-06-08 (×2): 2 [IU] via SUBCUTANEOUS
  Administered 2019-06-08: 11 [IU] via SUBCUTANEOUS
  Administered 2019-06-09: 2 [IU] via SUBCUTANEOUS
  Administered 2019-06-09: 3 [IU] via SUBCUTANEOUS

## 2019-06-05 MED ORDER — ASPIRIN 81 MG PO CHEW
81.0000 mg | CHEWABLE_TABLET | Freq: Every day | ORAL | Status: DC
  Administered 2019-06-05 – 2019-06-06 (×2): 81 mg
  Filled 2019-06-05 (×2): qty 1

## 2019-06-05 MED ORDER — CHLORHEXIDINE GLUCONATE 0.12 % MT SOLN
15.0000 mL | Freq: Two times a day (BID) | OROMUCOSAL | Status: DC
  Administered 2019-06-05 – 2019-06-09 (×8): 15 mL via OROMUCOSAL
  Filled 2019-06-05 (×7): qty 15

## 2019-06-05 MED ORDER — ORAL CARE MOUTH RINSE
15.0000 mL | Freq: Two times a day (BID) | OROMUCOSAL | Status: DC
  Administered 2019-06-05 – 2019-06-08 (×6): 15 mL via OROMUCOSAL

## 2019-06-05 NOTE — Progress Notes (Signed)
OT Cancellation Note  Patient Details Name: Scott Day MRN: [id number] DOB: 11-20-1960   Cancelled Treatment:    Reason Eval/Treat Not Completed: Patient at procedure or test/ unavailable(MRI. Will return as schedule allows.)  [place], OTR/L Acute Rehab Pager: [phone] Office: [phone] 06/05/2019, 2:36 PM

## 2019-06-05 NOTE — Evaluation (Addendum)
Occupational Therapy Evaluation Patient Details Name: Scott Day MRN: [id number] DOB: 03-29-1960 Today's Date: 06/05/2019    History of Present Illness 59 y.o. male with a history of colon cancer in 2012 and GERD, who presents with left-sided weakness. 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    Clinical Impression   PTA, pt was living alone and was very independent. Daughter reporting good family support between herself, her brother, and pt's girlfriend. Pt currently requiring Min A for UB ADLs, Mod-Max A for LB ADLs, and Mod A +2 for functional mobility. Pt presenting with inattention to left, right gaze and head turn, left lateral lean seen at bed level, sitting, and standing, and decreased cognition with poor awareness of deficits. Pt will require further acute OT to optimize safety and occupational performance. Due to pt's age, PLOF, good family support, and high motivation, recommend dc to CIR for intensive OT to increase independence with ADLs and functional mobility and decrease caregiver burden.     Follow Up Recommendations  CIR    Equipment Recommendations  Other (comment)    Recommendations for Other Services PT consult;Rehab consult;Speech consult     Precautions / Restrictions Precautions Precautions: Fall;Other (comment) Precaution Comments: Inattention of left      Mobility Bed Mobility Overal bed mobility: Needs Assistance Bed Mobility: Supine to Sit;Sit to Supine     Supine to sit: Min assist;HOB elevated Sit to supine: Min guard   General bed mobility comments: Min A for elevating trunk. Min guard A for safety in returning to supine  Transfers Overall transfer level: Needs assistance Equipment used: 2 person hand held assist Transfers: Sit to/from Stand Sit to Stand: +2 physical assistance;+2 safety/equipment;Min assist;Mod assist         General transfer comment: Min A +2 to power up into standing  and then Mod A +2 for correction of left lateral lean    Balance Overall balance assessment: Needs assistance Sitting-balance support: No upper extremity supported;Feet supported Sitting balance-Leahy Scale: Poor Sitting balance - Comments: Poor trunk control with lateral leaning   Standing balance support: Single extremity supported;During functional activity;No upper extremity supported Standing balance-Leahy Scale: Poor Standing balance comment: left lateral lean. Reliant on physical A to maintain balance                           ADL either performed or assessed with clinical judgement   ADL Overall ADL's : Needs assistance/impaired Eating/Feeding: NPO   Grooming: Sitting;Minimal assistance;Min guard;Wash/dry face Grooming Details (indicate cue type and reason): Pt wiping his mouth with washclothe in LUE and noting decreased coorindation, maintaining grasp, and dropping to clothe as soon as finished with poor attention to LUE Upper Body Bathing: Minimal assistance;Sitting   Lower Body Bathing: Moderate assistance;Sit to/from stand;Maximal assistance;+2 for physical assistance;+2 for safety/equipment   Upper Body Dressing : Minimal assistance;Sitting Upper Body Dressing Details (indicate cue type and reason): Overshooting to right with reach RUE into sleeve. Unable to correct with VCs and requiring Min A.  Lower Body Dressing: Maximal assistance;+2 for physical assistance;+2 for safety/equipment;Sit to/from stand Lower Body Dressing Details (indicate cue type and reason): Pt able to sequence donning of socks and requring Max A for don while sitting at EOB.  Toilet Transfer: Moderate assistance;Maximal assistance;+2 for physical assistance;+2 for safety/equipment;Ambulation           Functional mobility during ADLs: Moderate assistance;Maximal assistance;+2 for physical assistance;+2 for safety/equipment  General ADL Comments: Pt presenting with inattention to left,  poor balance, and visual deficits impacting his performance of ADLs. Highly distracted and poor awareness of deficits.      Vision Baseline Vision/History: No visual deficits Patient Visual Report: No change from baseline Vision Assessment?: Vision impaired- to be further tested in functional context;Yes Eye Alignment: Impaired (comment) Ocular Range of Motion: Restricted on the left Alignment/Gaze Preference: Gaze right;Head turned Visual Fields: Left visual field deficit;Impaired-to be further tested in functional context Diplopia Assessment: (Denies diplopia but overshooting to the right) Additional Comments: Pt with right gaze and head turn. Difficulty tracking to midline. Able to turn head to left with Max cues (i.e. when talking to him on left side or when fixing IV site at LUE). Pt overshooting to right when performing targeted reaching     Perception     Praxis      Pertinent Vitals/Pain Pain Assessment: Faces Faces Pain Scale: No hurt Pain Intervention(s): Monitored during session     Hand Dominance Right   Extremity/Trunk Assessment Upper Extremity Assessment Upper Extremity Assessment: LUE deficits/detail LUE Deficits / Details: Poor awareness of L side. Pt WFL for strength but weaker than right. Pt with poor coorindation.  LUE Coordination: decreased fine motor;decreased gross motor   Lower Extremity Assessment Lower Extremity Assessment: Defer to PT evaluation   Cervical / Trunk Assessment Cervical / Trunk Assessment: Other exceptions Cervical / Trunk Exceptions: Lateral lean to left   Communication Communication Communication: No difficulties   Cognition Arousal/Alertness: Awake/alert Behavior During Therapy: WFL for tasks assessed/performed Overall Cognitive Status: Impaired/Different from baseline Area of Impairment: Attention;Following commands;Safety/judgement;Memory;Problem solving;Awareness                   Current Attention Level:  Sustained Memory: Decreased recall of precautions;Decreased short-term memory Following Commands: Follows one step commands inconsistently;Follows one step commands with increased time Safety/Judgement: Decreased awareness of safety;Decreased awareness of deficits Awareness: Intellectual Problem Solving: Slow processing;Difficulty sequencing;Requires verbal cues;Requires tactile cues General Comments: Pt able to state he is in the possible for a "stroke, so they say." When asked what symptoms he had from the stroke he denied weakness, vision changes, or balance deficits (even at end of session after mobility. Pt with poor awareness of his deficits and requiring Mod-Max cues to bring awareness of left side (body and enviroment). Pt quickly distracted and benefiting from calm and quiet environment.   General Comments  Daughter present throughout. VSS    Exercises     Shoulder Instructions      Home Living Family/patient expects to be discharged to:: Private residence Living Arrangements: Alone Available Help at Discharge: Family Type of Home: House Home Access: Stairs to enter Technical brewer of Steps: 2   Home Layout: One level     Bathroom Shower/Tub: Teacher, early years/pre: Standard     Home Equipment: None          Prior Functioning/Environment Level of Independence: Independent        Comments: ADLs, IADLs, and works in [employer].        OT Problem List: Decreased strength;Decreased range of motion;Decreased activity tolerance;Impaired balance (sitting and/or standing);Impaired vision/perception;Decreased coordination;Decreased cognition;Decreased safety awareness;Decreased knowledge of use of DME or AE;Decreased knowledge of precautions;Impaired UE functional use      OT Treatment/Interventions: Self-care/ADL training;Therapeutic exercise;Energy conservation;DME and/or AE instruction;Therapeutic activities;Patient/family education;Balance  training;Cognitive remediation/compensation    OT Goals(Current goals can be found in the care plan section) Acute Rehab OT Goals  Patient Stated Goal: Go outside and feel the sunshine OT Goal Formulation: With patient Time For Goal Achievement: 06/19/19 Potential to Achieve Goals: Good  OT Frequency: Min 3X/week   Barriers to D/C:            Co-evaluation PT/OT/SLP Co-Evaluation/Treatment: Yes Reason for Co-Treatment: For patient/therapist safety;To address functional/ADL transfers   OT goals addressed during session: ADL's and self-care      AM-PAC OT "6 Clicks" Daily Activity     Outcome Measure Help from another person eating meals?: Total Help from another person taking care of personal grooming?: A Little Help from another person toileting, which includes using toliet, bedpan, or urinal?: A Lot Help from another person bathing (including washing, rinsing, drying)?: A Lot Help from another person to put on and taking off regular upper body clothing?: A Little Help from another person to put on and taking off regular lower body clothing?: A Lot 6 Click Score: 13   End of Session Equipment Utilized During Treatment: Gait belt Nurse Communication: Mobility status  Activity Tolerance: Patient tolerated treatment well Patient left: in bed;with call bell/phone within reach;with bed alarm set;with family/visitor present  OT Visit Diagnosis: Unsteadiness on feet (R26.81);Other abnormalities of gait and mobility (R26.89);Muscle weakness (generalized) (M62.81);Hemiplegia and hemiparesis;Low vision, both eyes (H54.2);Ataxia, unspecified (R27.0);Other symptoms and signs involving cognitive function Hemiplegia - Right/Left: Left Hemiplegia - dominant/non-dominant: Non-Dominant Hemiplegia - caused by: Cerebral infarction                Time: 1533-1611 OT Time Calculation (min): 38 min Charges:  OT General Charges $OT Visit: 1 Visit OT Evaluation $OT Eval Moderate Complexity: 1  Mod OT Treatments $Self Care/Home Management : 8-22 mins  Tariya Morrissette MSOT, OTR/L Acute Rehab Pager: [phone] Office: [place] 06/05/2019, 5:13 PM

## 2019-06-05 NOTE — Progress Notes (Signed)
Delay in administering aspirin and brilinta--attempted to advance NG tube x2 per radiology. Removed old NG tube, placed new NG tube, currently waiting for new x-ray and placement confirmation. MD Rosalin Hawking at made aware of delay at bedside.

## 2019-06-05 NOTE — Progress Notes (Signed)
PT Cancellation Note  Patient Details Name: Scott Day MRN: [id number] DOB: 1961/03/04   Cancelled Treatment:    Reason Eval/Treat Not Completed: Patient at procedure or test/unavailable  Patient off the unit. RN reports in [place], PT Pager [phone]  Rexanne Mano 06/05/2019, 2:35 PM

## 2019-06-05 NOTE — Evaluation (Signed)
Clinical/Bedside Swallow Evaluation Patient Details  Name: Scott Day MRN: [id number] Date of Birth: 10-11-1960  Today's Date: 06/05/2019 Time: SLP Start Time (ACUTE ONLY): 0900 SLP Stop Time (ACUTE ONLY): 0917 SLP Time Calculation (min) (ACUTE ONLY): 17 min  Past Medical History:  Past Medical History:  Diagnosis Date  . Cancer [facility]) 2012   colon cancer  . Closed fracture of left distal radius   . GERD (gastroesophageal reflux disease)    Past Surgical History:  Past Surgical History:  Procedure Laterality Date  . COLON SURGERY  2012   colon cancer  . IR CT HEAD LTD  06/04/2019  . IR CT HEAD LTD  06/04/2019  . IR INTRA CRAN STENT  06/04/2019  . IR PERCUTANEOUS ART THROMBECTOMY/INFUSION INTRACRANIAL INC DIAG ANGIO  06/04/2019  . IR US GUIDE VASC ACCESS RIGHT  06/04/2019  . OPEN REDUCTION INTERNAL FIXATION (ORIF) DISTAL RADIAL FRACTURE Left 09/20/2017   Procedure: OPEN REDUCTION INTERNAL FIXATION (ORIF) DISTAL RADIAL FRACTURE;  Surgeon: Leanora Cover, MD;  Location: [place];  Service: Orthopedics;  Laterality: Left;  . RADIOLOGY WITH ANESTHESIA N/A 06/04/2019   Procedure: IR WITH ANESTHESIA;  Surgeon: Radiologist, Medication, MD;  Location: [place];  Service: Radiology;  Laterality: N/A;   HPI:  Scott GILE is a 59 y.o. male with a history of colon cancer in 2012 and GERD, who presents with left-sided weakness. 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   Assessment / Plan / Recommendation Clinical Impression   Pt was upright in bed during session. Pt's oral mech was remarkable for reduced L facial strength (CN VII) and lingual strength (XII). Pt's voice was also noted to be very rough and gravelly. Pt was observed with ice chips and thin liquids. Following ice chips, pt was noted with strong delayed cough producing thick, bloody secretions. With thin liquids, pt was observed with immediate/delayed coughing, again producing  thick, bloody secretions. Given pt's current mentation and s/sx of aspiration, recommend continuing with NPO and ice chips sparingly following thorough oral care. Will continue to follow.   SLP Visit Diagnosis: Dysphagia, unspecified (R13.10)    Aspiration Risk  Mild aspiration risk    Diet Recommendation NPO;Ice chips PRN after oral care   Medication Administration: Via alternative means    Other  Recommendations Oral Care Recommendations: Oral care BID Other Recommendations: Have oral suction available   Follow up Recommendations 24 hour supervision/assistance;Other (comment)(tba)      Frequency and Duration min 2x/week  2 weeks       Prognosis Prognosis for Safe Diet Advancement: Good Barriers to Reach Goals: Cognitive deficits      Swallow Study   General Date of Onset: 06/04/19 HPI: [name] is a 59 y.o. male with a history of colon cancer in 2012 and GERD, who presents with left-sided weakness. 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 Type of Study: Bedside Swallow Evaluation Previous Swallow Assessment: none in chart Diet Prior to this Study: NPO Temperature Spikes Noted: No Respiratory Status: Room air History of Recent Intubation: Yes Length of Intubations (days): 1 days Date extubated: 06/04/19 Behavior/Cognition: Requires cueing;Cooperative;Confused Oral Cavity Assessment: Other (comment)(bloody secretions) Oral Care Completed by SLP: Yes Oral Cavity - Dentition: Adequate natural dentition Vision: Functional for self-feeding Self-Feeding Abilities: Able to feed self;Needs assist Patient Positioning: Upright in bed Baseline Vocal Quality: Other (comment)(gravelly, rough) Volitional Swallow: Able to elicit    Oral/Motor/Sensory Function Overall Oral  Motor/Sensory Function: Mild impairment Facial ROM: Within Functional Limits Facial Symmetry: Abnormal symmetry left Facial Strength: Reduced left Lingual  ROM: Within Functional Limits Lingual Symmetry: Within Functional Limits Lingual Strength: Reduced Lingual Sensation: Within Functional Limits Velum: Within Functional Limits Mandible: Within Functional Limits   Ice Chips Ice chips: Impaired Presentation: Spoon;Self Fed Pharyngeal Phase Impairments: Cough - Delayed   Thin Liquid Thin Liquid: Impaired Presentation: Spoon;Cup Pharyngeal  Phase Impairments: Cough - Immediate;Cough - Delayed    Nectar Thick Nectar Thick Liquid: Not tested   Honey Thick Honey Thick Liquid: Not tested   Puree Puree: Not tested   Solid     Solid: Not tested     [name], Student SLP Office: [phone]  06/05/2019,9:57 AM

## 2019-06-05 NOTE — Progress Notes (Signed)
Rehab Admissions Coordinator Note:  Patient was screened by Cleatrice Burke for appropriateness for an Inpatient Acute Rehab Consult.  At this time, we are recommending Inpatient Rehab consult. I will place order per protocol.  Cleatrice Burke RN MSN 06/05/2019, 8:10 PM  I can be reached at [phone].

## 2019-06-05 NOTE — Plan of Care (Signed)
  Problem: Education: Goal: Knowledge of disease or condition will improve Outcome: Progressing   

## 2019-06-05 NOTE — Progress Notes (Signed)
Attempted vascular exam twice, once at [place] and again at 1630.  Patient with PT and is unavailable.

## 2019-06-05 NOTE — Evaluation (Signed)
Physical Therapy Evaluation Patient Details Name: Scott Day MRN: [id number] DOB: [birth year] Today's Date: 06/05/2019   History of Present Illness  59 y.o. male with a history of colon cancer in 2012 and GERD, who presents with left-sided weakness. 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   Clinical Impression   Pt admitted with above diagnosis. Patient was independent and working in [employer] prior to CVA. Patient currently with left inattention and weakness impacting his safety with all mobility. Requires +2 moderate assist to walk due to heavy left lean/contralateral pushing. Patient has good family support per daughter. Pt currently with functional limitations due to the deficits listed below (see PT Problem List). Pt will benefit from skilled PT to increase their independence and safety with mobility to allow discharge to the venue listed below.       Follow Up Recommendations CIR;Supervision/Assistance - 24 hour    Equipment Recommendations  Other (comment)(TBD)    Recommendations for Other Services Rehab consult     Precautions / Restrictions Precautions Precautions: Fall;Other (comment) Precaution Comments: Inattention of left      Mobility  Bed Mobility Overal bed mobility: Needs Assistance Bed Mobility: Supine to Sit;Sit to Supine     Supine to sit: Min assist;HOB elevated Sit to supine: Min guard   General bed mobility comments: Min A for elevating trunk. Min guard A for safety in returning to supine  Transfers Overall transfer level: Needs assistance Equipment used: 2 person hand held assist Transfers: Sit to/from Stand Sit to Stand: +2 physical assistance;+2 safety/equipment;Min assist;Mod assist         General transfer comment: Min A +2 to power up into standing and then Mod A +2 for correction of left lateral lean  Ambulation/Gait Ambulation/Gait assistance: Mod assist;+2 physical assistance Gait  Distance (Feet): 25 Feet Assistive device: 2 person hand held assist Gait Pattern/deviations: Step-through pattern;Decreased stride length;Decreased weight shift to right;Staggering left;Drifts right/left     General Gait Details: decr attn to left environment with running into wall; left lean  Stairs            Wheelchair Mobility    Modified Rankin (Stroke Patients Only) Modified Rankin (Stroke Patients Only) Pre-Morbid Rankin Score: No symptoms Modified Rankin: Moderately severe disability     Balance Overall balance assessment: Needs assistance Sitting-balance support: No upper extremity supported;Feet supported Sitting balance-Leahy Scale: Poor Sitting balance - Comments: Poor trunk control with lateral leaning   Standing balance support: Single extremity supported;During functional activity;No upper extremity supported Standing balance-Leahy Scale: Poor Standing balance comment: left lateral lean. Reliant on physical A to maintain balance due to decr awareness                             Pertinent Vitals/Pain Pain Assessment: Faces Faces Pain Scale: No hurt Pain Intervention(s): Monitored during session    Home Living Family/patient expects to be discharged to:: Private residence Living Arrangements: Alone(Simultaneous filing. User may not have seen previous data.) Available Help at Discharge: Family;Available 24 hours/day Type of Home: House Home Access: Stairs to enter   [employer] of Steps: 2 Home Layout: One level Home Equipment: None      Prior Function Level of Independence: Independent         Comments: ADLs, IADLs, and works in [employer].     Hand Dominance   Dominant Hand: Right    Extremity/Trunk Assessment   Upper Extremity Assessment  Upper Extremity Assessment: Defer to OT evaluation LUE Deficits / Details: Poor awareness of L side. Pt WFL for strength but weaker than right. Pt with poor coorindation.   LUE Coordination: decreased fine motor;decreased gross motor    Lower Extremity Assessment Lower Extremity Assessment: LLE deficits/detail;Difficult to assess due to impaired cognition LLE Deficits / Details: inattention to left hemi-body; no buckling in standing or gait    Cervical / Trunk Assessment Cervical / Trunk Assessment: Other exceptions Cervical / Trunk Exceptions: Lateral lean to left; rt gaze preference but can turn past midline with max cues  Communication   Communication: No difficulties  Cognition Arousal/Alertness: Awake/alert Behavior During Therapy: Impulsive Overall Cognitive Status: Impaired/Different from baseline Area of Impairment: Attention;Following commands;Safety/judgement;Memory;Problem solving;Awareness                   Current Attention Level: Sustained Memory: Decreased recall of precautions;Decreased short-term memory Following Commands: Follows one step commands inconsistently;Follows one step commands with increased time Safety/Judgement: Decreased awareness of safety;Decreased awareness of deficits Awareness: Intellectual Problem Solving: Slow processing;Difficulty sequencing;Requires verbal cues;Requires tactile cues General Comments: Pt able to state he is in the hospital for a "stroke, so they say." When asked what symptoms he had from the stroke he denied weakness, vision changes, or balance deficits (even at end of session after mobility required 2 person assist). Pt with poor awareness of his deficits and requiring Mod-Max cues to bring awareness of left side (body and enviroment). Pt quickly distracted and benefiting from calm and quiet environment.      General Comments General comments (skin integrity, edema, etc.): Daughter present throughout. VSS    Exercises     Assessment/Plan    PT Assessment Patient needs continued PT services  PT Problem List Decreased strength;Decreased balance;Decreased mobility;Decreased  cognition;Decreased knowledge of use of DME;Decreased safety awareness;Decreased knowledge of precautions;Impaired sensation       PT Treatment Interventions DME instruction;Stair training;Gait training;Functional mobility training;Therapeutic activities;Balance training;Neuromuscular re-education;Cognitive remediation;Patient/family education    PT Goals (Current goals can be found in the Care Plan section)  Acute Rehab PT Goals Patient Stated Goal: Go outside and feel the sunshine PT Goal Formulation: With patient Time For Goal Achievement: 06/19/19 Potential to Achieve Goals: Good    Frequency Min 4X/week   Barriers to discharge        Co-evaluation PT/OT/SLP Co-Evaluation/Treatment: Yes Reason for Co-Treatment: Complexity of the patient's impairments (multi-system involvement);For patient/therapist safety;To address functional/ADL transfers PT goals addressed during session: Mobility/safety with mobility;Balance OT goals addressed during session: ADL's and self-care       AM-PAC PT "6 Clicks" Mobility  Outcome Measure Help needed turning from your back to your side while in a flat bed without using bedrails?: A Little Help needed moving from lying on your back to sitting on the side of a flat bed without using bedrails?: A Little Help needed moving to and from a bed to a chair (including a wheelchair)?: A Lot Help needed standing up from a chair using your arms (e.g., wheelchair or bedside chair)?: A Lot Help needed to walk in hospital room?: A Lot Help needed climbing 3-5 steps with a railing? : Total 6 Click Score: 13    End of Session Equipment Utilized During Treatment: Gait belt Activity Tolerance: Patient tolerated treatment well Patient left: in bed;with call bell/phone within reach;with bed alarm set;with family/visitor present Nurse Communication: Mobility status PT Visit Diagnosis: Unsteadiness on feet (R26.81);Other abnormalities of gait and mobility  (R26.89);Hemiplegia and hemiparesis Hemiplegia -  Right/Left: Left Hemiplegia - dominant/non-dominant: Non-dominant Hemiplegia - caused by: Cerebral infarction    Time: 1545-1611 PT Time Calculation (min) (ACUTE ONLY): 26 min   Charges:   PT Evaluation $PT Eval Moderate Complexity: 1 Mod           Arby Barrette, PT Pager [phone]   Rexanne Mano 06/05/2019, 6:20 PM

## 2019-06-05 NOTE — Evaluation (Addendum)
Speech Language Pathology Evaluation Patient Details Name: Scott Day MRN: [id number] DOB: 06/03/1960 Today's Date: 06/05/2019 Time: [id number] SLP Time Calculation (min) (ACUTE ONLY): 12 min  Problem List:  Patient Active Problem List   Diagnosis Date Noted  . Acute ischemic cerebrovascular accident (CVA) involving right middle cerebral artery territory ([place]) 06/04/2019  . Arterial ischemic stroke, MCA, right, acute ([place]) 06/04/2019   Past Medical History:  Past Medical History:  Diagnosis Date  . Cancer [facility]) 2012   colon cancer  . Closed fracture of left distal radius   . GERD (gastroesophageal reflux disease)    Past Surgical History:  Past Surgical History:  Procedure Laterality Date  . COLON SURGERY  2012   colon cancer  . IR CT HEAD LTD  06/04/2019  . IR CT HEAD LTD  06/04/2019  . IR INTRA CRAN STENT  06/04/2019  . IR PERCUTANEOUS ART THROMBECTOMY/INFUSION INTRACRANIAL INC DIAG ANGIO  06/04/2019  . IR US GUIDE VASC ACCESS RIGHT  06/04/2019  . OPEN REDUCTION INTERNAL FIXATION (ORIF) DISTAL RADIAL FRACTURE Left 09/20/2017   Procedure: OPEN REDUCTION INTERNAL FIXATION (ORIF) DISTAL RADIAL FRACTURE;  Surgeon: Leanora Cover, MD;  Location: [facility];  Service: Orthopedics;  Laterality: Left;  . RADIOLOGY WITH ANESTHESIA N/A 06/04/2019   Procedure: IR WITH ANESTHESIA;  Surgeon: Radiologist, Medication, MD;  Location: [facility];  Service: Radiology;  Laterality: N/A;   HPI:  Scott Day is a 59 y.o. male with a history of colon cancer in 2012 and GERD, who presents with left-sided weakness. 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   Assessment / Plan / Recommendation Clinical Impression   Patient presents with cognitive-linguistic impairments across the following areas: orientation, memory, awareness, problem solving, attention and executive functioning. Pt was also noted with dysarthria and reduced ineligibility at the  word level. Pt was oriented to person and situation, and was able to follow simple one step commands. When pt was questioned about any deficits following his stroke, he stated "he's coughing more," but has not noticed any other difficulties. During a delayed word memory task, pt was able to correctly identify 2/4 words, but was unable to recall the other words, despite Max cues. He also had difficulties attending to memorization task, and during other functional tasks during the assessment. Pt will benefit from continued SLP services targeting specified deficits.     SLP Assessment  SLP Recommendation/Assessment: Patient needs continued Speech Lanaguage Pathology Services SLP Visit Diagnosis: Dysphagia, unspecified (R13.10)    Follow Up Recommendations  24 hour supervision/assistance;Other (comment)(tba)    Frequency and Duration min 2x/week  2 weeks      SLP Evaluation Cognition  Overall Cognitive Status: Impaired/Different from baseline Arousal/Alertness: Lethargic Orientation Level: (P) Oriented X4 Attention: Sustained Sustained Attention: Impaired Sustained Attention Impairment: Verbal complex;Functional basic Memory: Impaired Memory Impairment: Storage deficit;Decreased recall of new information;Decreased short term memory Decreased Short Term Memory: Verbal complex;Functional basic Awareness: Impaired Awareness Impairment: Intellectual impairment;Emergent impairment;Anticipatory impairment Problem Solving: Impaired Problem Solving Impairment: Verbal complex;Functional basic Executive Function: Reasoning;Initiating;Self Correcting Reasoning: Impaired Reasoning Impairment: Verbal complex;Functional basic Initiating: Impaired Initiating Impairment: Verbal complex;Functional basic Self Correcting: Impaired Self Correcting Impairment: Verbal complex;Functional basic       Comprehension       Expression Expression Primary Mode of Expression: Verbal Verbal Expression Overall  Verbal Expression: Appears within functional limits for tasks assessed   Oral / Motor  Oral Motor/Sensory Function Overall Oral Motor/Sensory Function: Mild impairment Facial  ROM: Within Functional Limits Facial Symmetry: Abnormal symmetry left Facial Strength: Reduced left Lingual ROM: Within Functional Limits Lingual Symmetry: Within Functional Limits Lingual Strength: Reduced Lingual Sensation: Within Functional Limits Velum: Within Functional Limits Mandible: Within Functional Limits Motor Speech Overall Motor Speech: Impaired Articulation: Impaired Level of Impairment: Conversation Intelligibility: Intelligibility reduced Word: 75-100% accurate Phrase: 75-100% accurate Sentence: 75-100% accurate Conversation: 75-100% accurate Motor Planning: Witnin functional limits   [place], Student SLP Office: [phone]  06/05/2019, 9:56 AM

## 2019-06-05 NOTE — Progress Notes (Signed)
Referring Physician(s): Code Stroke- [name]  Supervising Physician: Pedro Earls  Patient Status:  [facility] - In-pt  Chief Complaint: None  Subjective:  History of acute CVA s/p cerebral arteriogram with emergent mechanical thrombectomy and stent placement of mid/distal right MCA M1 occlusion achieving a TICI 3 revascularization 06/04/2019 by Dr. Karenann Cai. Patient awake and alert sitting in bed with no complaints at this time. Can spontaneously move all extremities. Speech dysarthric. Left facial droop present. Right gaze preference. Right groin incision c/d/i.   Allergies: Patient has no known allergies.  Medications: Prior to Admission medications   Medication Sig Start Date End Date Taking? Authorizing Provider  HYDROcodone-acetaminophen (NORCO) 5-325 MG tablet 1-2 tabs po q6 hours prn pain Patient not taking: Reported on 06/04/2019 09/20/17   Leanora Cover, MD     Vital Signs: BP 126/67   Pulse 70   Temp 98.8 F (37.1 C) (Oral)   Resp 16   Ht 5' 11"  (1.803 m)   Wt 231 lb 7.7 oz (105 kg)   SpO2 96%   BMI 32.29 kg/m   Physical Exam Vitals and nursing note reviewed.  Constitutional:      General: He is not in acute distress.    Appearance: Normal appearance.  Pulmonary:     Effort: Pulmonary effort is normal. No respiratory distress.  Skin:    General: Skin is warm and dry.     Comments: Right groin incision soft without active bleeding or hematoma.  Neurological:     Mental Status: He is alert.     Comments: Alert, awake, and oriented x3. Speech dysarthric. Follows simple commands. PERRL bilaterally. Demonstrates right gaze preference. Left facial droop. Tongue midline. Can spontaneously move all extremities. Sensation equal on upper extremities. No pronator drift. Distal pulses 1+ bilaterally.     Imaging: CT ANGIO HEAD W OR WO CONTRAST  Addendum Date: 06/04/2019   ADDENDUM REPORT: 06/04/2019 12:21  ADDENDUM: Contrast dose is 100 mL Omnipaque 350 Electronically Signed   By: Macy Mis M.D.   On: 06/04/2019 12:21   Result Date: 06/04/2019 CLINICAL DATA:  Code stroke.  Slurred speech and left facial droop EXAM: CT ANGIOGRAPHY HEAD AND NECK CT PERFUSION BRAIN TECHNIQUE: Multidetector CT imaging of the head and neck was performed using the standard protocol during bolus administration of intravenous contrast. Multiplanar CT image reconstructions and MIPs were obtained to evaluate the vascular anatomy. Carotid stenosis measurements (when applicable) are obtained utilizing NASCET criteria, using the distal internal carotid diameter as the denominator. Multiphase CT imaging of the brain was performed following IV bolus contrast injection. Subsequent parametric perfusion maps were calculated using RAPID software. COMPARISON:  None. FINDINGS: CT HEAD Brain: There is no acute intracranial hemorrhage mass effect edema. Gray-white differentiation is preserved. Ventricles and sulci normal in size and configuration. There is no extra-axial fluid collection. Vascular: Hyperdense vessel. Skull: Unremarkable. Sinuses/Orbits: Lobular mucosal thickening with greatest involvement of the right maxillary sinus. No acute orbital finding. Other: Mastoid air cells are clear ASPECTS [facility] Stroke Program Early CT Score) - Ganglionic level infarction (caudate, lentiform nuclei, internal capsule, insula, M1-M3 cortex): 7 - Supraganglionic infarction (M4-M6 cortex): 3 Total score (0-10 with 10 being normal): 10 Review of the MIP images confirms the above findings CTA NECK Aortic arch: Great vessel origins are patent. Right carotid system: Patent. There is trace calcified plaque at the ICA origin measurable stenosis. Left carotid system: Patent. Eccentric noncalcified plaque causing less than 50% stenosis. Mild  calcified plaque at the ICA origin causing less than 50% stenosis. Vertebral arteries: Patent.  Left vertebral artery  dominant. Skeleton: Cervical spine degenerative changes. Other neck: No mass or adenopathy Upper chest: No apical lung mass. Review of the MIP images confirms the above findings CTA HEAD Anterior circulation: Intracranial internal carotid arteries patent. There is nonocclusive thrombus within distal right M1 MCA extending to the bifurcation. Left middle cerebral artery is patent. Anterior cerebral arteries are patent. Congenitally absent right A1 ACA. Posterior circulation: Intracranial vertebral arteries, basilar artery, and posterior cerebral arteries are patent. There are bilateral patent posterior communicating arteries. Venous sinuses: As permitted by contrast timing, patent. Review of the MIP images confirms the above findings CT Brain Perfusion: CBF (<30%) Volume: 5m Perfusion (Tmax>6.0s) volume: 1527mMismatch Volume: 12659mnfarction Location: Right MCA territory IMPRESSION: No acute intracranial hemorrhage or evidence of acute infarction. ASPECT score is 10. 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 Perfusion imaging demonstrates 25 mL core infarction and 126 mL territory at risk in the right MCA territory. No hemodynamically significant stenosis in the neck. These results were called by telephone at the time of interpretation on 06/04/2019 at 11:53 am to provider JON[facility] CenterLESAlexis Goodellwho verbally acknowledged these results. Electronically Signed: By: PraMacy MisD. On: 06/04/2019 11:59   DG Abd 1 View  Result Date: 06/04/2019 CLINICAL DATA:  Nasogastric tube placement. EXAM: ABDOMEN - 1 VIEW COMPARISON:  None. FINDINGS: A nasogastric tube is seen with its distal tip overlying the body of the stomach. This is approximately 7.8 cm distal to the expected region of the gastroesophageal junction. The bowel gas pattern is normal. No radio-opaque calculi or other significant radiographic abnormality are seen. IMPRESSION: Nasogastric tube  positioning, as described above. Further advancement of the NG tube by approximately 5 cm is recommended to decrease the risk of aspiration. Electronically Signed   By: ThaVirgina NorfolkD.   On: 06/04/2019 20:23   CT HEAD WO CONTRAST  Result Date: 06/04/2019 CLINICAL DATA:  Stroke, follow-up. EXAM: CT HEAD WITHOUT CONTRAST TECHNIQUE: Contiguous axial images were obtained from the base of the skull through the vertex without intravenous contrast. COMPARISON:  Noncontrast head CT and CT angiogram head/neck performed earlier the same day 06/04/2019 FINDINGS: Brain: There has been interval development of patchy acute cortical/subcortical infarction changes within the right MCA vascular territory. Findings are most notable within the right frontal operculum, right insula and within portions of the right temporoparietal junction (series 3, image 20) (series 3, image 18) (series 5, image 48). No evidence of hemorrhagic conversion. No significant mass effect. No midline shift. There is no hydrocephalus. No extra-axial fluid collection is identified. Vascular: A stent is now present in the region of the M1 right middle cerebral artery. Otherwise, no hyperdense vessel is identified. Skull: Normal. Negative for fracture or focal lesion. Sinuses/Orbits: Visualized orbits demonstrate no acute abnormality. Moderate mucosal thickening and frothy secretions within the right maxillary sinus. Additional mild scattered paranasal sinus mucosal thickening. Left frontal sinus mucous retention cyst. No significant mastoid effusion. IMPRESSION: Interval development of patchy acute cortical/subcortical infarction changes within the right MCA vascular territory most notable within the right frontal operculum, right insula and portions of the right temporoparietal junction. No hemorrhagic conversion. No midline shift or significant mass effect. Paranasal sinus disease as described, most notably right maxillary. Electronically Signed    By: KylKellie Simmering   On: 06/04/2019 18:43   CT ANGIO NECK  W OR WO CONTRAST  Addendum Date: 06/04/2019   ADDENDUM REPORT: 06/04/2019 12:21 ADDENDUM: Contrast dose is 100 mL Omnipaque 350 Electronically Signed   By: Macy Mis M.D.   On: 06/04/2019 12:21   Result Date: 06/04/2019 CLINICAL DATA:  Code stroke.  Slurred speech and left facial droop EXAM: CT ANGIOGRAPHY HEAD AND NECK CT PERFUSION BRAIN TECHNIQUE: Multidetector CT imaging of the head and neck was performed using the standard protocol during bolus administration of intravenous contrast. Multiplanar CT image reconstructions and MIPs were obtained to evaluate the vascular anatomy. Carotid stenosis measurements (when applicable) are obtained utilizing NASCET criteria, using the distal internal carotid diameter as the denominator. Multiphase CT imaging of the brain was performed following IV bolus contrast injection. Subsequent parametric perfusion maps were calculated using RAPID software. COMPARISON:  None. FINDINGS: CT HEAD Brain: There is no acute intracranial hemorrhage mass effect edema. Gray-white differentiation is preserved. Ventricles and sulci normal in size and configuration. There is no extra-axial fluid collection. Vascular: Hyperdense vessel. Skull: Unremarkable. Sinuses/Orbits: Lobular mucosal thickening with greatest involvement of the right maxillary sinus. No acute orbital finding. Other: Mastoid air cells are clear ASPECTS [facility] Stroke Program Early CT Score) - Ganglionic level infarction (caudate, lentiform nuclei, internal capsule, insula, M1-M3 cortex): 7 - Supraganglionic infarction (M4-M6 cortex): 3 Total score (0-10 with 10 being normal): 10 Review of the MIP images confirms the above findings CTA NECK Aortic arch: Great vessel origins are patent. Right carotid system: Patent. There is trace calcified plaque at the ICA origin measurable stenosis. Left carotid system: Patent. Eccentric noncalcified plaque causing less than  50% stenosis. Mild calcified plaque at the ICA origin causing less than 50% stenosis. Vertebral arteries: Patent.  Left vertebral artery dominant. Skeleton: Cervical spine degenerative changes. Other neck: No mass or adenopathy Upper chest: No apical lung mass. Review of the MIP images confirms the above findings CTA HEAD Anterior circulation: Intracranial internal carotid arteries patent. There is nonocclusive thrombus within distal right M1 MCA extending to the bifurcation. Left middle cerebral artery is patent. Anterior cerebral arteries are patent. Congenitally absent right A1 ACA. Posterior circulation: Intracranial vertebral arteries, basilar artery, and posterior cerebral arteries are patent. There are bilateral patent posterior communicating arteries. Venous sinuses: As permitted by contrast timing, patent. Review of the MIP images confirms the above findings CT Brain Perfusion: CBF (<30%) Volume: 15m Perfusion (Tmax>6.0s) volume: 1580mMismatch Volume: 12633mnfarction Location: Right MCA territory IMPRESSION: No acute intracranial hemorrhage or evidence of acute infarction. ASPECT score is 10. 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 Perfusion imaging demonstrates 25 mL core infarction and 126 mL territory at risk in the right MCA territory. No hemodynamically significant stenosis in the neck. These results were called by telephone at the time of interpretation on 06/04/2019 at 11:53 am to provider JONSun [facility] Az Endoscopy Asc LLCLESAlexis Goodellwho verbally acknowledged these results. Electronically Signed: By: PraMacy MisD. On: 06/04/2019 11:59   IR Intra Cran Stent  Result Date: 06/04/2019 CLINICAL DATA:  [age]ar old male with past medical history significant for colon cancer in 2012. He developed left-sided weakness and neglect on awakening this morning. [facility] showed small hypodensity in the right insula with no  hemorrhage. NIHSS 10. CT angiogram showed tapering of the right M1/MCA with short-segment filling defect. CT perfusion showed an estimated core volume of 25 mL with a mismatch of 126 mL. He was transferred to our service for  emergency treatment of the right M1/MCA occlusion. EXAM: IR CT HEAD LIMITED; IR PERCUTANEOUS ART THORMBECTOMY/INFUSION INTRACRANIAL INCLUDE DIAG ANGIO; IR ULTRASOUND GUIDANCE VASC ACCESS RIGHT; INTRACRANIAL STENT (INCL PTA) ANESTHESIA/SEDATION: General anesthesia. MEDICATIONS: Intra arterial Verapamil 5 mg; intra arterial Integrilin 19 mg. CONTRAST:  63m OMNIPAQUE IOHEXOL 300 MG/ML SOLN, 788mOMNIPAQUE IOHEXOL 240 MG/ML SOLN PROCEDURE: Operator: Dr. KaPedro EarlsMD. No healthcare proxy or next of kin available for informed consent. Discussion held with the neurohospitalist attending. Agreement reached that the procedure would be in the best interest of the patient and benefits outweighed risks. The patient was made to lie supine on the angiography table. He was prepped and draped utilizing the usual sterile technique. Using gray scale ultrasound-guided, a micropuncture kit and modified Seldinger technique, access was gained to the right common femoral artery. An 8 French femoral sheath was then placed. Under fluoroscopy, an 8 Fr[place]rus balloon guide catheter was navigated over a 6 FrPakistanerenstein 2 catheter and a 0.038 inch Terumo Glidewire into the aortic arch. Under fluoroscopy, the catheter was advanced into the right common carotid artery and then into the right internal carotid artery. Frontal and lateral angiograms of the head were obtained showing a distal right M1/MCA occlusion with minimal delayed penetration of contrast. A large bore aspiration catheter was then navigated through the walrus balloon guide catheter and over a phenom 21 microcatheter and a synchro support microguidewire into the cavernous segment of the right ICA. The microcatheter was then advanced  over the microwire into a right M1/MCA middle division branch. A 6 mm solitaire stent retriever was deployed spanning the right M1/MCA and proximal M2/middle division branch. Device was allowed to intercalated with the clot for 4 minutes. The microcatheter was removed under fluoroscopy. The guiding catheter balloon was inflated at the level of the proximal petrous segment. Thrombectomy device and aspiration catheter were then removed under constant aspiration. Follow-up angiogram showed recanalization of the M1 segment with subocclusive filling defect and slow flow in distal MCA branches (TICI 2C). A large bore aspiration catheter was again navigated through the walrus balloon guide catheter and over a phenom 21 microcatheter and a synchro support microguidewire into the cavernous segment of the right ICA. The microcatheter was then advanced over the microwire into a right M1/MCA posterior division branch. A 6 mm solitaire stent retriever was deployed spanning the right M1/MCA and proximal M2 posterior division branch. Device was allowed to intercalated with the clot for 4 minutes. The microcatheter was removed under fluoroscopy. The guiding catheter balloon was inflated at the level of the proximal petrous segment. Thrombectomy device and aspiration catheter were then removed under constant aspiration. Follow-up angiogram show normal forward flow into the right MCA vascular territory noting a small dissection flap in the mid/distal right M1/MCA segment. Delayed follow-up angiogram showed progressive reocclusion of the right M1/MCA with minimal forward flow. Flat panel CT of the head was obtained and post processed in a separate workstation under concurrent attending physician supervision. No evidence of hemorrhagic complication noted. Right ICA angiograms with frontal lateral views of the head were obtained. Further progression of occlusion in the right M1 segment was noted. Small non flow limiting iatrogenic  dissection in the upper cervical segment of the right ICA identified. The large bore aspiration catheter was navigated through the walrus balloon guide catheter and over a SL 10 microcatheter and a synchro support microguidewire into the cavernous segment of the right ICA. The microcatheter was then advanced over the microwire into  a right M1/MCA posterior division branch. Intra arterial infusion of 19 mg of Integrilin was performed into the right ICA. Subsequently, a 3 mm x 15 mm atlas intracranial stent was deployed in the M1 segment across the dissection flap. Follow-up angiogram showed adequate stent positioning with complete restoration of the anterograde flow in the right MCA. Delayed follow-up angiogram (x2) show no evidence of reocclusion. 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 Postprocedural flat panel CT of the head was obtained and post processed in a separate workstation under concurrent attending physician supervision. No evidence of hemorrhagic complication noted. The catheter system was withdrawn. A right common femoral artery angiogram was obtained showing access at the level of the right common femoral artery. The femoral sheath was removed and the access was closed with a Perclose ProGlide. Immediate hemostasis achieved. 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 FINDINGS: Dissection of the mid/distal right M1/MCA. IMPRESSION: 1. Mechanical thrombectomy performed (x2) with temporary restoration of flow revealing dissection flap in the mid/distal right M1/MCA segment. 2. Intracranial stenting placed in the right M1/MCA across the dissection flap with complete restoration of flow. 3. No hemorrhagic complication on postprocedural flat panel CT. No embolus to new territory. PLAN: Patient is to remain on Integrilin drip. A follow-up head CT will be obtained at 6:30 p.m. today. At this  point, patient will be re-evaluated for transition to oral dual anti-platelet therapy. Electronically Signed   By: Pedro Earls M.D.   On: 06/04/2019 17:40   IR CT Head Ltd  Result Date: 06/04/2019 CLINICAL DATA:  59 year old male with past medical history significant for colon cancer in 2012. He developed left-sided weakness and neglect on awakening this morning. He was taken to [facility]. Head CT showed small hypodensity in the right insula with no hemorrhage. NIHSS 10. CT angiogram showed tapering of the right M1/MCA with short-segment filling defect. CT perfusion showed an estimated core volume of 25 mL with a mismatch of 126 mL. He was transferred to our service for emergency treatment of the right M1/MCA occlusion. EXAM: IR CT HEAD LIMITED; IR PERCUTANEOUS ART THORMBECTOMY/INFUSION INTRACRANIAL INCLUDE DIAG ANGIO; IR ULTRASOUND GUIDANCE VASC ACCESS RIGHT; INTRACRANIAL STENT (INCL PTA) ANESTHESIA/SEDATION: General anesthesia. MEDICATIONS: Intra arterial Verapamil 5 mg; intra arterial Integrilin 19 mg. CONTRAST:  21m OMNIPAQUE IOHEXOL 300 MG/ML SOLN, 762mOMNIPAQUE IOHEXOL 240 MG/ML SOLN PROCEDURE: Operator: Dr. KaPedro EarlsMD. No healthcare proxy or next of kin available for informed consent. Discussion held with the neurohospitalist attending. Agreement reached that the procedure would be in the best interest of the patient and benefits outweighed risks. The patient was made to lie supine on the angiography table. He was prepped and draped utilizing the usual sterile technique. Using gray scale ultrasound-guided, a micropuncture kit and modified Seldinger technique, access was gained to the right common femoral artery. An 8 French femoral sheath was then placed. Under fluoroscopy, an 8 Fr[place]rus balloon guide catheter was navigated over a 6 FrPakistanerenstein 2 catheter and a 0.038 inch Terumo Glidewire into the aortic arch. Under fluoroscopy, the  catheter was advanced into the right common carotid artery and then into the right internal carotid artery. Frontal and lateral angiograms of the head were obtained showing a distal right M1/MCA occlusion with minimal delayed penetration of contrast. A large bore aspiration catheter was then navigated through the walrus balloon guide catheter and over a  phenom 21 microcatheter and a synchro support microguidewire into the cavernous segment of the right ICA. The microcatheter was then advanced over the microwire into a right M1/MCA middle division branch. A 6 mm solitaire stent retriever was deployed spanning the right M1/MCA and proximal M2/middle division branch. Device was allowed to intercalated with the clot for 4 minutes. The microcatheter was removed under fluoroscopy. The guiding catheter balloon was inflated at the level of the proximal petrous segment. Thrombectomy device and aspiration catheter were then removed under constant aspiration. Follow-up angiogram showed recanalization of the M1 segment with subocclusive filling defect and slow flow in distal MCA branches (TICI 2C). A large bore aspiration catheter was again navigated through the walrus balloon guide catheter and over a phenom 21 microcatheter and a synchro support microguidewire into the cavernous segment of the right ICA. The microcatheter was then advanced over the microwire into a right M1/MCA posterior division branch. A 6 mm solitaire stent retriever was deployed spanning the right M1/MCA and proximal M2 posterior division branch. Device was allowed to intercalated with the clot for 4 minutes. The microcatheter was removed under fluoroscopy. The guiding catheter balloon was inflated at the level of the proximal petrous segment. Thrombectomy device and aspiration catheter were then removed under constant aspiration. Follow-up angiogram show normal forward flow into the right MCA vascular territory noting a small dissection flap in the  mid/distal right M1/MCA segment. Delayed follow-up angiogram showed progressive reocclusion of the right M1/MCA with minimal forward flow. Flat panel CT of the head was obtained and post processed in a separate workstation under concurrent attending physician supervision. No evidence of hemorrhagic complication noted. Right ICA angiograms with frontal lateral views of the head were obtained. Further progression of occlusion in the right M1 segment was noted. Small non flow limiting iatrogenic dissection in the upper cervical segment of the right ICA identified. The large bore aspiration catheter was navigated through the walrus balloon guide catheter and over a SL 10 microcatheter and a synchro support microguidewire into the cavernous segment of the right ICA. The microcatheter was then advanced over the microwire into a right M1/MCA posterior division branch. Intra arterial infusion of 19 mg of Integrilin was performed into the right ICA. Subsequently, a 3 mm x 15 mm atlas intracranial stent was deployed in the M1 segment across the dissection flap. Follow-up angiogram showed adequate stent positioning with complete restoration of the anterograde flow in the right MCA. Delayed follow-up angiogram (x2) show no evidence of reocclusion. 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 Postprocedural flat panel CT of the head was obtained and post processed in a separate workstation under concurrent attending physician supervision. No evidence of hemorrhagic complication noted. The catheter system was withdrawn. A right common femoral artery angiogram was obtained showing access at the level of the right common femoral artery. The femoral sheath was removed and the access was closed with a Perclose ProGlide. Immediate hemostasis achieved. 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 FINDINGS: Dissection of the  mid/distal right M1/MCA. IMPRESSION: 1. Mechanical thrombectomy performed (x2) with temporary restoration of flow revealing dissection flap in the mid/distal right M1/MCA segment. 2. Intracranial stenting placed in the right M1/MCA across the dissection flap with complete restoration of flow. 3. No hemorrhagic complication on postprocedural flat panel CT. No embolus to new territory. PLAN: Patient is to remain on Integrilin drip. A follow-up head CT will be  obtained at 6:30 p.m. today. At this point, patient will be re-evaluated for transition to oral dual anti-platelet therapy. Electronically Signed   By: Pedro Earls M.D.   On: 06/04/2019 17:40   IR CT Head Ltd  Result Date: 06/04/2019 CLINICAL DATA:  59 year old male with past medical history significant for colon cancer in 2012. He developed left-sided weakness and neglect on awakening this morning. He was taken to [facility] hospital. Head CT showed small hypodensity in the right insula with no hemorrhage. NIHSS 10. CT angiogram showed tapering of the right M1/MCA with short-segment filling defect. CT perfusion showed an estimated core volume of 25 mL with a mismatch of 126 mL. He was transferred to our service for emergency treatment of the right M1/MCA occlusion. EXAM: IR CT HEAD LIMITED; IR PERCUTANEOUS ART THORMBECTOMY/INFUSION INTRACRANIAL INCLUDE DIAG ANGIO; IR ULTRASOUND GUIDANCE VASC ACCESS RIGHT; INTRACRANIAL STENT (INCL PTA) ANESTHESIA/SEDATION: General anesthesia. MEDICATIONS: Intra arterial Verapamil 5 mg; intra arterial Integrilin 19 mg. CONTRAST:  90m OMNIPAQUE IOHEXOL 300 MG/ML SOLN, 748mOMNIPAQUE IOHEXOL 240 MG/ML SOLN PROCEDURE: Operator: Dr. KaPedro EarlsMD. No healthcare proxy or next of kin available for informed consent. Discussion held with the neurohospitalist attending. Agreement reached that the procedure would be in the best interest of the patient and benefits outweighed risks. The patient  was made to lie supine on the angiography table. He was prepped and draped utilizing the usual sterile technique. Using gray scale ultrasound-guided, a micropuncture kit and modified Seldinger technique, access was gained to the right common femoral artery. An 8 French femoral sheath was then placed. Under fluoroscopy, an 8 [place] balloon guide catheter was navigated over a 6 FrPakistanerenstein 2 catheter and a 0.038 inch Terumo Glidewire into the aortic arch. Under fluoroscopy, the catheter was advanced into the right common carotid artery and then into the right internal carotid artery. Frontal and lateral angiograms of the head were obtained showing a distal right M1/MCA occlusion with minimal delayed penetration of contrast. A large bore aspiration catheter was then navigated through the walrus balloon guide catheter and over a phenom 21 microcatheter and a synchro support microguidewire into the cavernous segment of the right ICA. The microcatheter was then advanced over the microwire into a right M1/MCA middle division branch. A 6 mm solitaire stent retriever was deployed spanning the right M1/MCA and proximal M2/middle division branch. Device was allowed to intercalated with the clot for 4 minutes. The microcatheter was removed under fluoroscopy. The guiding catheter balloon was inflated at the level of the proximal petrous segment. Thrombectomy device and aspiration catheter were then removed under constant aspiration. Follow-up angiogram showed recanalization of the M1 segment with subocclusive filling defect and slow flow in distal MCA branches (TICI 2C). A large bore aspiration catheter was again navigated through the walrus balloon guide catheter and over a phenom 21 microcatheter and a synchro support microguidewire into the cavernous segment of the right ICA. The microcatheter was then advanced over the microwire into a right M1/MCA posterior division branch. A 6 mm solitaire stent retriever was  deployed spanning the right M1/MCA and proximal M2 posterior division branch. Device was allowed to intercalated with the clot for 4 minutes. The microcatheter was removed under fluoroscopy. The guiding catheter balloon was inflated at the level of the proximal petrous segment. Thrombectomy device and aspiration catheter were then removed under constant aspiration. Follow-up angiogram show normal forward flow into the right MCA vascular territory noting a small dissection flap  in the mid/distal right M1/MCA segment. Delayed follow-up angiogram showed progressive reocclusion of the right M1/MCA with minimal forward flow. Flat panel CT of the head was obtained and post processed in a separate workstation under concurrent attending physician supervision. No evidence of hemorrhagic complication noted. Right ICA angiograms with frontal lateral views of the head were obtained. Further progression of occlusion in the right M1 segment was noted. Small non flow limiting iatrogenic dissection in the upper cervical segment of the right ICA identified. The large bore aspiration catheter was navigated through the walrus balloon guide catheter and over a SL 10 microcatheter and a synchro support microguidewire into the cavernous segment of the right ICA. The microcatheter was then advanced over the microwire into a right M1/MCA posterior division branch. Intra arterial infusion of 19 mg of Integrilin was performed into the right ICA. Subsequently, a 3 mm x 15 mm atlas intracranial stent was deployed in the M1 segment across the dissection flap. Follow-up angiogram showed adequate stent positioning with complete restoration of the anterograde flow in the right MCA. Delayed follow-up angiogram (x2) show no evidence of reocclusion. 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 Postprocedural flat panel CT of the head was obtained and post processed in a separate workstation under  concurrent attending physician supervision. No evidence of hemorrhagic complication noted. The catheter system was withdrawn. A right common femoral artery angiogram was obtained showing access at the level of the right common femoral artery. The femoral sheath was removed and the access was closed with a Perclose ProGlide. Immediate hemostasis achieved. 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 FINDINGS: Dissection of the mid/distal right M1/MCA. IMPRESSION: 1. Mechanical thrombectomy performed (x2) with temporary restoration of flow revealing dissection flap in the mid/distal right M1/MCA segment. 2. Intracranial stenting placed in the right M1/MCA across the dissection flap with complete restoration of flow. 3. No hemorrhagic complication on postprocedural flat panel CT. No embolus to new territory. PLAN: Patient is to remain on Integrilin drip. A follow-up head CT will be obtained at 6:30 p.m. today. At this point, patient will be re-evaluated for transition to oral dual anti-platelet therapy. Electronically Signed   By: Pedro Earls M.D.   On: 06/04/2019 17:40   IR US Guide Vasc Access Right  Result Date: 06/04/2019 CLINICAL DATA:  59 year old male with past medical history significant for colon cancer in 2012. He developed left-sided weakness and neglect on awakening this morning. He was taken to [facility] hospital. Head CT showed small hypodensity in the right insula with no hemorrhage. NIHSS 10. CT angiogram showed tapering of the right M1/MCA with short-segment filling defect. CT perfusion showed an estimated core volume of 25 mL with a mismatch of 126 mL. He was transferred to our service for emergency treatment of the right M1/MCA occlusion. EXAM: IR CT HEAD LIMITED; IR PERCUTANEOUS ART THORMBECTOMY/INFUSION INTRACRANIAL INCLUDE DIAG ANGIO; IR ULTRASOUND GUIDANCE VASC ACCESS RIGHT; INTRACRANIAL STENT (INCL PTA)  ANESTHESIA/SEDATION: General anesthesia. MEDICATIONS: Intra arterial Verapamil 5 mg; intra arterial Integrilin 19 mg. CONTRAST:  42m OMNIPAQUE IOHEXOL 300 MG/ML SOLN, 710mOMNIPAQUE IOHEXOL 240 MG/ML SOLN PROCEDURE: Operator: Dr. KaPedro EarlsMD. No healthcare proxy or next of kin available for informed consent. Discussion held with the neurohospitalist attending. Agreement reached that the procedure would be in the best interest of the patient and benefits outweighed risks. The patient was made to lie supine  on the angiography table. He was prepped and draped utilizing the usual sterile technique. Using gray scale ultrasound-guided, a micropuncture kit and modified Seldinger technique, access was gained to the right common femoral artery. An 8 French femoral sheath was then placed. Under fluoroscopy, an 8 [place] Walrus balloon guide catheter was navigated over a 6 [place] Berenstein 2 catheter and a 0.038 inch Terumo Glidewire into the aortic arch. Under fluoroscopy, the catheter was advanced into the right common carotid artery and then into the right internal carotid artery. Frontal and lateral angiograms of the head were obtained showing a distal right M1/MCA occlusion with minimal delayed penetration of contrast. A large bore aspiration catheter was then navigated through the walrus balloon guide catheter and over a phenom 21 microcatheter and a synchro support microguidewire into the cavernous segment of the right ICA. The microcatheter was then advanced over the microwire into a right M1/MCA middle division branch. A 6 mm solitaire stent retriever was deployed spanning the right M1/MCA and proximal M2/middle division branch. Device was allowed to intercalated with the clot for 4 minutes. The microcatheter was removed under fluoroscopy. The guiding catheter balloon was inflated at the level of the proximal petrous segment. Thrombectomy device and aspiration catheter were then removed under  constant aspiration. Follow-up angiogram showed recanalization of the M1 segment with subocclusive filling defect and slow flow in distal MCA branches (TICI 2C). A large bore aspiration catheter was again navigated through the walrus balloon guide catheter and over a phenom 21 microcatheter and a synchro support microguidewire into the cavernous segment of the right ICA. The microcatheter was then advanced over the microwire into a right M1/MCA posterior division branch. A 6 mm solitaire stent retriever was deployed spanning the right M1/MCA and proximal M2 posterior division branch. Device was allowed to intercalated with the clot for 4 minutes. The microcatheter was removed under fluoroscopy. The guiding catheter balloon was inflated at the level of the proximal petrous segment. Thrombectomy device and aspiration catheter were then removed under constant aspiration. Follow-up angiogram show normal forward flow into the right MCA vascular territory noting a small dissection flap in the mid/distal right M1/MCA segment. Delayed follow-up angiogram showed progressive reocclusion of the right M1/MCA with minimal forward flow. Flat panel CT of the head was obtained and post processed in a separate workstation under concurrent attending physician supervision. No evidence of hemorrhagic complication noted. Right ICA angiograms with frontal lateral views of the head were obtained. Further progression of occlusion in the right M1 segment was noted. Small non flow limiting iatrogenic dissection in the upper cervical segment of the right ICA identified. The large bore aspiration catheter was navigated through the walrus balloon guide catheter and over a SL 10 microcatheter and a synchro support microguidewire into the cavernous segment of the right ICA. The microcatheter was then advanced over the microwire into a right M1/MCA posterior division branch. Intra arterial infusion of 19 mg of Integrilin was performed into the  right ICA. Subsequently, a 3 mm x 15 mm atlas intracranial stent was deployed in the M1 segment across the dissection flap. Follow-up angiogram showed adequate stent positioning with complete restoration of the anterograde flow in the right MCA. Delayed follow-up angiogram (x2) show no evidence of reocclusion. 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 Postprocedural flat panel CT of the head was obtained and post processed in a separate workstation under concurrent attending physician supervision. No evidence of hemorrhagic complication noted.  The catheter system was withdrawn. A right common femoral artery angiogram was obtained showing access at the level of the right common femoral artery. The femoral sheath was removed and the access was closed with a Perclose ProGlide. Immediate hemostasis achieved. 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 FINDINGS: Dissection of the mid/distal right M1/MCA. IMPRESSION: 1. Mechanical thrombectomy performed (x2) with temporary restoration of flow revealing dissection flap in the mid/distal right M1/MCA segment. 2. Intracranial stenting placed in the right M1/MCA across the dissection flap with complete restoration of flow. 3. No hemorrhagic complication on postprocedural flat panel CT. No embolus to new territory. PLAN: Patient is to remain on Integrilin drip. A follow-up head CT will be obtained at 6:30 p.m. today. At this point, patient will be re-evaluated for transition to oral dual anti-platelet therapy. Electronically Signed   By: Pedro Earls M.D.   On: 06/04/2019 17:40   CT CEREBRAL PERFUSION W CONTRAST  Addendum Date: 06/04/2019   ADDENDUM REPORT: 06/04/2019 12:21 ADDENDUM: Contrast dose is 100 mL Omnipaque 350 Electronically Signed   By: Macy Mis M.D.   On: 06/04/2019 12:21   Result Date: 06/04/2019 CLINICAL DATA:  Code stroke.   Slurred speech and left facial droop EXAM: CT ANGIOGRAPHY HEAD AND NECK CT PERFUSION BRAIN TECHNIQUE: Multidetector CT imaging of the head and neck was performed using the standard protocol during bolus administration of intravenous contrast. Multiplanar CT image reconstructions and MIPs were obtained to evaluate the vascular anatomy. Carotid stenosis measurements (when applicable) are obtained utilizing NASCET criteria, using the distal internal carotid diameter as the denominator. Multiphase CT imaging of the brain was performed following IV bolus contrast injection. Subsequent parametric perfusion maps were calculated using RAPID software. COMPARISON:  None. FINDINGS: CT HEAD Brain: There is no acute intracranial hemorrhage mass effect edema. Gray-white differentiation is preserved. Ventricles and sulci normal in size and configuration. There is no extra-axial fluid collection. Vascular: Hyperdense vessel. Skull: Unremarkable. Sinuses/Orbits: Lobular mucosal thickening with greatest involvement of the right maxillary sinus. No acute orbital finding. Other: Mastoid air cells are clear ASPECTS [facility] Stroke Program Early CT Score) - Ganglionic level infarction (caudate, lentiform nuclei, internal capsule, insula, M1-M3 cortex): 7 - Supraganglionic infarction (M4-M6 cortex): 3 Total score (0-10 with 10 being normal): 10 Review of the MIP images confirms the above findings CTA NECK Aortic arch: Great vessel origins are patent. Right carotid system: Patent. There is trace calcified plaque at the ICA origin measurable stenosis. Left carotid system: Patent. Eccentric noncalcified plaque causing less than 50% stenosis. Mild calcified plaque at the ICA origin causing less than 50% stenosis. Vertebral arteries: Patent.  Left vertebral artery dominant. Skeleton: Cervical spine degenerative changes. Other neck: No mass or adenopathy Upper chest: No apical lung mass. Review of the MIP images confirms the above findings CTA  HEAD Anterior circulation: Intracranial internal carotid arteries patent. There is nonocclusive thrombus within distal right M1 MCA extending to the bifurcation. Left middle cerebral artery is patent. Anterior cerebral arteries are patent. Congenitally absent right A1 ACA. Posterior circulation: Intracranial vertebral arteries, basilar artery, and posterior cerebral arteries are patent. There are bilateral patent posterior communicating arteries. Venous sinuses: As permitted by contrast timing, patent. Review of the MIP images confirms the above findings CT Brain Perfusion: CBF (<30%) Volume: 72m Perfusion (Tmax>6.0s) volume: 1581mMismatch Volume: 12665mnfarction Location: Right MCA territory IMPRESSION: No acute intracranial hemorrhage or evidence of acute infarction. ASPECT score is 10. 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 Perfusion imaging demonstrates 25 mL core infarction and 126 mL territory at risk in the right MCA territory. No hemodynamically significant stenosis in the neck. These results were called by telephone at the time of interpretation on 06/04/2019 at 11:53 am to provider [facility] ; Alexis Goodell , who verbally acknowledged these results. Electronically Signed: By: Macy Mis M.D. On: 06/04/2019 11:59   IR PERCUTANEOUS ART THROMBECTOMY/INFUSION INTRACRANIAL INC DIAG ANGIO  Result Date: 06/04/2019 CLINICAL DATA:  59 year old male with past medical history significant for colon cancer in 2012. He developed left-sided weakness and neglect on awakening this morning. He was taken to [facility] hospital. Head CT showed small hypodensity in the right insula with no hemorrhage. NIHSS 10. CT angiogram showed tapering of the right M1/MCA with short-segment filling defect. CT perfusion showed an estimated core volume of 25 mL with a mismatch of 126 mL. He was transferred to our service for emergency treatment of the right M1/MCA  occlusion. EXAM: IR CT HEAD LIMITED; IR PERCUTANEOUS ART THORMBECTOMY/INFUSION INTRACRANIAL INCLUDE DIAG ANGIO; IR ULTRASOUND GUIDANCE VASC ACCESS RIGHT; INTRACRANIAL STENT (INCL PTA) ANESTHESIA/SEDATION: General anesthesia. MEDICATIONS: Intra arterial Verapamil 5 mg; intra arterial Integrilin 19 mg. CONTRAST:  17m OMNIPAQUE IOHEXOL 300 MG/ML SOLN, 774mOMNIPAQUE IOHEXOL 240 MG/ML SOLN PROCEDURE: Operator: Dr. KaPedro EarlsMD. No healthcare proxy or next of kin available for informed consent. Discussion held with the neurohospitalist attending. Agreement reached that the procedure would be in the best interest of the patient and benefits outweighed risks. The patient was made to lie supine on the angiography table. He was prepped and draped utilizing the usual sterile technique. Using gray scale ultrasound-guided, a micropuncture kit and modified Seldinger technique, access was gained to the right common femoral artery. An 8 French femoral sheath was then placed. Under fluoroscopy, an 8 [place] balloon guide catheter was navigated over a 6 FrPakistanerenstein 2 catheter and a 0.038 inch Terumo Glidewire into the aortic arch. Under fluoroscopy, the catheter was advanced into the right common carotid artery and then into the right internal carotid artery. Frontal and lateral angiograms of the head were obtained showing a distal right M1/MCA occlusion with minimal delayed penetration of contrast. A large bore aspiration catheter was then navigated through the walrus balloon guide catheter and over a phenom 21 microcatheter and a synchro support microguidewire into the cavernous segment of the right ICA. The microcatheter was then advanced over the microwire into a right M1/MCA middle division branch. A 6 mm solitaire stent retriever was deployed spanning the right M1/MCA and proximal M2/middle division branch. Device was allowed to intercalated with the clot for 4 minutes. The microcatheter was  removed under fluoroscopy. The guiding catheter balloon was inflated at the level of the proximal petrous segment. Thrombectomy device and aspiration catheter were then removed under constant aspiration. Follow-up angiogram showed recanalization of the M1 segment with subocclusive filling defect and slow flow in distal MCA branches (TICI 2C). A large bore aspiration catheter was again navigated through the walrus balloon guide catheter and over a phenom 21 microcatheter and a synchro support microguidewire into the cavernous segment of the right ICA. The microcatheter was then advanced over the microwire into a right M1/MCA posterior division branch. A 6 mm solitaire stent retriever was deployed spanning the right M1/MCA and proximal M2 posterior division branch. Device was allowed to intercalated with the clot for 4 minutes. The microcatheter was removed  under fluoroscopy. The guiding catheter balloon was inflated at the level of the proximal petrous segment. Thrombectomy device and aspiration catheter were then removed under constant aspiration. Follow-up angiogram show normal forward flow into the right MCA vascular territory noting a small dissection flap in the mid/distal right M1/MCA segment. Delayed follow-up angiogram showed progressive reocclusion of the right M1/MCA with minimal forward flow. Flat panel CT of the head was obtained and post processed in a separate workstation under concurrent attending physician supervision. No evidence of hemorrhagic complication noted. Right ICA angiograms with frontal lateral views of the head were obtained. Further progression of occlusion in the right M1 segment was noted. Small non flow limiting iatrogenic dissection in the upper cervical segment of the right ICA identified. The large bore aspiration catheter was navigated through the walrus balloon guide catheter and over a SL 10 microcatheter and a synchro support microguidewire into the cavernous segment of the  right ICA. The microcatheter was then advanced over the microwire into a right M1/MCA posterior division branch. Intra arterial infusion of 19 mg of Integrilin was performed into the right ICA. Subsequently, a 3 mm x 15 mm atlas intracranial stent was deployed in the M1 segment across the dissection flap. Follow-up angiogram showed adequate stent positioning with complete restoration of the anterograde flow in the right MCA. Delayed follow-up angiogram (x2) show no evidence of reocclusion. 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 Postprocedural flat panel CT of the head was obtained and post processed in a separate workstation under concurrent attending physician supervision. No evidence of hemorrhagic complication noted. The catheter system was withdrawn. A right common femoral artery angiogram was obtained showing access at the level of the right common femoral artery. The femoral sheath was removed and the access was closed with a Perclose ProGlide. Immediate hemostasis achieved. 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 FINDINGS: Dissection of the mid/distal right M1/MCA. IMPRESSION: 1. Mechanical thrombectomy performed (x2) with temporary restoration of flow revealing dissection flap in the mid/distal right M1/MCA segment. 2. Intracranial stenting placed in the right M1/MCA across the dissection flap with complete restoration of flow. 3. No hemorrhagic complication on postprocedural flat panel CT. No embolus to new territory. PLAN: Patient is to remain on Integrilin drip. A follow-up head CT will be obtained at 6:30 p.m. today. At this point, patient will be re-evaluated for transition to oral dual anti-platelet therapy. Electronically Signed   By: Pedro Earls M.D.   On: 06/04/2019 17:40   CT HEAD CODE STROKE WO CONTRAST  Addendum Date: 06/04/2019   ADDENDUM REPORT: 06/04/2019  12:21 ADDENDUM: Contrast dose is 100 mL Omnipaque 350 Electronically Signed   By: Macy Mis M.D.   On: 06/04/2019 12:21   Result Date: 06/04/2019 CLINICAL DATA:  Code stroke.  Slurred speech and left facial droop EXAM: CT ANGIOGRAPHY HEAD AND NECK CT PERFUSION BRAIN TECHNIQUE: Multidetector CT imaging of the head and neck was performed using the standard protocol during bolus administration of intravenous contrast. Multiplanar CT image reconstructions and MIPs were obtained to evaluate the vascular anatomy. Carotid stenosis measurements (when applicable) are obtained utilizing NASCET criteria, using the distal internal carotid diameter as the denominator. Multiphase CT imaging of the brain was performed following IV bolus contrast injection. Subsequent parametric perfusion maps were calculated using RAPID software. COMPARISON:  None. FINDINGS: CT HEAD Brain: There is no acute intracranial hemorrhage mass effect edema.  Gray-white differentiation is preserved. Ventricles and sulci normal in size and configuration. There is no extra-axial fluid collection. Vascular: Hyperdense vessel. Skull: Unremarkable. Sinuses/Orbits: Lobular mucosal thickening with greatest involvement of the right maxillary sinus. No acute orbital finding. Other: Mastoid air cells are clear ASPECTS [facility] Stroke Program Early CT Score) - Ganglionic level infarction (caudate, lentiform nuclei, internal capsule, insula, M1-M3 cortex): 7 - Supraganglionic infarction (M4-M6 cortex): 3 Total score (0-10 with 10 being normal): 10 Review of the MIP images confirms the above findings CTA NECK Aortic arch: Great vessel origins are patent. Right carotid system: Patent. There is trace calcified plaque at the ICA origin measurable stenosis. Left carotid system: Patent. Eccentric noncalcified plaque causing less than 50% stenosis. Mild calcified plaque at the ICA origin causing less than 50% stenosis. Vertebral arteries: Patent.  Left vertebral artery  dominant. Skeleton: Cervical spine degenerative changes. Other neck: No mass or adenopathy Upper chest: No apical lung mass. Review of the MIP images confirms the above findings CTA HEAD Anterior circulation: Intracranial internal carotid arteries patent. There is nonocclusive thrombus within distal right M1 MCA extending to the bifurcation. Left middle cerebral artery is patent. Anterior cerebral arteries are patent. Congenitally absent right A1 ACA. Posterior circulation: Intracranial vertebral arteries, basilar artery, and posterior cerebral arteries are patent. There are bilateral patent posterior communicating arteries. Venous sinuses: As permitted by contrast timing, patent. Review of the MIP images confirms the above findings CT Brain Perfusion: CBF (<30%) Volume: 85m Perfusion (Tmax>6.0s) volume: 1575mMismatch Volume: 12672mnfarction Location: Right MCA territory IMPRESSION: No acute intracranial hemorrhage or evidence of acute infarction. ASPECT score is 10. 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 Perfusion imaging demonstrates 25 mL core infarction and 126 mL territory at risk in the right MCA territory. No hemodynamically significant stenosis in the neck. These results were called by telephone at the time of interpretation on 06/04/2019 at 11:53 am to provider [facility] CenterLESAlexis Goodellwho verbally acknowledged these results. Electronically Signed: By: PraMacy MisD. On: 06/04/2019 11:59    Labs:  CBC: Recent Labs    06/04/19 1125  WBC 8.4  HGB 15.2  HCT 44.3  PLT 216    COAGS: Recent Labs    06/04/19 1125  INR 1.1  APTT 31    BMP: Recent Labs    06/04/19 1125  NA 133*  K 4.0  CL 98  CO2 26  GLUCOSE 213*  BUN 7  CALCIUM 8.8*  CREATININE 0.91  GFRNONAA >60  GFRAA >60    LIVER FUNCTION TESTS: Recent Labs    06/04/19 1125  BILITOT 0.9  AST 19  ALT 24  ALKPHOS 74  PROT 6.9  ALBUMIN 3.9     Assessment and Plan:  History of acute CVA s/p cerebral arteriogram with emergent mechanical thrombectomy and stent placement of mid/distal right MCA M1 occlusion achieving a TICI 3 revascularization 06/04/2019 by Dr. de Karenann Caiatient's condition improving- moving all extremities, speech dysarthric, right gaze preference, left facial droop. Right groin incision stable, distal pulses 1+ bilaterally. Continue taking Brilinta 90 mg twice daily and Aspirin 81 mg once daily. Plan to follow-up with Dr. de Karenann Cai clinic in 3 months, and for repeat image-guided diagnostic cerebral arteriogram in 6 months with Dr. de Karenann Cairder placed to facilitate follow-up. Further plans per neurology- appreciate and agree with management. NIR to follow.     Electronically Signed: AleEarley AbideA-C 06/05/2019, 9:16 AM  I spent a total of 25 Minutes at the the patient's bedside AND on the patient's hospital floor or unit, greater than 50% of which was counseling/coordinating care for right MCA M1 occlusion s/p revascularization.

## 2019-06-05 NOTE — Progress Notes (Signed)
STROKE TEAM PROGRESS NOTE   INTERVAL HISTORY Daughter at bedside. Pt right UE and LE strength much improved. Awake alert with dysarthria. However, not able to pass swallow. OG tube placement had some difficulty. Will try smaller 10 French tube again today. On ASA and brilinta.   Vitals:   06/05/19 0500 06/05/19 0600 06/05/19 0700 06/05/19 0800  BP: (!) 143/81 (!) 133/91 114/84 126/67  Pulse: 78 85 74 70  Resp: 19 15 20 16   Temp:    98.8 F (37.1 C)  TempSrc:    Oral  SpO2: 94% 96% 94% 96%  Weight:      Height:        CBC:  Recent Labs  Lab 06/04/19 1125  WBC 8.4  NEUTROABS 6.7  HGB 15.2  HCT 44.3  MCV 92.3  PLT 123XX123    Basic Metabolic Panel:  Recent Labs  Lab 06/04/19 1125  NA 133*  K 4.0  CL 98  CO2 26  GLUCOSE 213*  BUN 7  CREATININE 0.91  CALCIUM 8.8*   Lipid Panel:     Component Value Date/Time   CHOL 180 06/05/2019 0543   TRIG 131 06/05/2019 0543   HDL 28 (L) 06/05/2019 0543   CHOLHDL 6.4 06/05/2019 0543   VLDL 26 06/05/2019 0543   LDLCALC 126 (H) 06/05/2019 0543   HgbA1c:  Lab Results  Component Value Date   HGBA1C 8.3 (H) 06/05/2019   Urine Drug Screen: No results found for: LABOPIA, COCAINSCRNUR, LABBENZ, AMPHETMU, THCU, LABBARB  Alcohol Level     Component Value Date/Time   ETH <10 06/04/2019 1157    IMAGING past 24 hours DG Abd 1 View  Result Date: 06/05/2019 CLINICAL DATA:  New NG tube EXAM: ABDOMEN - 1 VIEW COMPARISON:  None. FINDINGS: Advancement of NG tube such that the tip is at the expected location of the junction of the second third portion the duodenum. IMPRESSION: NG tube extends into duodenum Electronically Signed   By: Suzy Bouchard M.D.   On: 06/05/2019 14:28   DG Abd 1 View  Result Date: 06/04/2019 CLINICAL DATA:  Nasogastric tube placement. EXAM: ABDOMEN - 1 VIEW COMPARISON:  None. FINDINGS: A nasogastric tube is seen with its distal tip overlying the body of the stomach. This is approximately 7.8 cm distal to the  expected region of the gastroesophageal junction. The bowel gas pattern is normal. No radio-opaque calculi or other significant radiographic abnormality are seen. IMPRESSION: Nasogastric tube positioning, as described above. Further advancement of the NG tube by approximately 5 cm is recommended to decrease the risk of aspiration. Electronically Signed   By: Virgina Norfolk M.D.   On: 06/04/2019 20:23   CT HEAD WO CONTRAST  Result Date: 06/04/2019 CLINICAL DATA:  Stroke, follow-up. EXAM: CT HEAD WITHOUT CONTRAST TECHNIQUE: Contiguous axial images were obtained from the base of the skull through the vertex without intravenous contrast. COMPARISON:  Noncontrast head CT and CT angiogram head/neck performed earlier the same day 06/04/2019 FINDINGS: Brain: There has been interval development of patchy acute cortical/subcortical infarction changes within the right MCA vascular territory. Findings are most notable within the right frontal operculum, right insula and within portions of the right temporoparietal junction (series 3, image 20) (series 3, image 18) (series 5, image 48). No evidence of hemorrhagic conversion. No significant mass effect. No midline shift. There is no hydrocephalus. No extra-axial fluid collection is identified. Vascular: A stent is now present in the region of the M1 right middle cerebral artery. Otherwise,  no hyperdense vessel is identified. Skull: Normal. Negative for fracture or focal lesion. Sinuses/Orbits: Visualized orbits demonstrate no acute abnormality. Moderate mucosal thickening and frothy secretions within the right maxillary sinus. Additional mild scattered paranasal sinus mucosal thickening. Left frontal sinus mucous retention cyst. No significant mastoid effusion. IMPRESSION: Interval development of patchy acute cortical/subcortical infarction changes within the right MCA vascular territory most notable within the right frontal operculum, right insula and portions of the  right temporoparietal junction. No hemorrhagic conversion. No midline shift or significant mass effect. Paranasal sinus disease as described, most notably right maxillary. Electronically Signed   By: Kellie Simmering DO   On: 06/04/2019 18:43   DG CHEST PORT 1 VIEW  Result Date: 06/05/2019 CLINICAL DATA:  NG tube placement. EXAM: PORTABLE CHEST 1 VIEW COMPARISON:  CT angiogram of the chest dated 09/13/2002 FINDINGS: NG tube tip is below the diaphragm. Heart size and pulmonary vascularity are normal. Lungs are clear. No significant bone abnormality. IMPRESSION: Normal chest.  NG tube tip below the diaphragm. Electronically Signed   By: Lorriane Shire M.D.   On: 06/05/2019 14:29   DG Abd Portable 1V  Result Date: 06/05/2019 CLINICAL DATA:  Nasogastric tube placement. EXAM: PORTABLE ABDOMEN - 1 VIEW COMPARISON:  Same day. FINDINGS: The bowel gas pattern is normal. Distal tip of nasogastric tube is seen in expected position of the gastroesophageal junction. No radio-opaque calculi or other significant radiographic abnormality are seen. IMPRESSION: Distal tip of nasogastric tube seen in expected position of gastroesophageal junction. Proximal side hole is seen in distal esophagus. Advancement is recommended. Electronically Signed   By: Marijo Conception M.D.   On: 06/05/2019 12:01   DG Abd Portable 1V  Result Date: 06/05/2019 CLINICAL DATA:  Nasogastric tube placement EXAM: PORTABLE ABDOMEN - 1 VIEW COMPARISON:  Portable exam 1050 hours compared to 0858 hours FINDINGS: Tip of nasogastric tube projects over stomach though the proximal side-port projects over the distal esophagus; recommend advancing tube 5 cm. Nonobstructive bowel gas pattern. No bowel dilatation or bowel wall thickening. Osseous structures unremarkable. IMPRESSION: Proximal side-port of nasogastric tube projects over distal esophagus; recommend advancing tube 5 cm. Electronically Signed   By: Lavonia Dana M.D.   On: 06/05/2019 11:00   DG Abd  Portable 1V  Result Date: 06/05/2019 CLINICAL DATA:  NG tube placement EXAM: PORTABLE ABDOMEN - 1 VIEW COMPARISON:  06/04/2019 FINDINGS: Esophagogastric tube remains with tip below the diaphragm and side port above the gastroesophageal junction. General paucity of bowel although scattered gas is gas present to the rectum. No free air on supine radiographs. IMPRESSION: Esophagogastric tube remains with tip below the diaphragm and side port above the gastroesophageal junction. Recommend advancement to ensure subdiaphragmatic positioning of tip and side port. Electronically Signed   By: Eddie Candle M.D.   On: 06/05/2019 09:25   ECHOCARDIOGRAM COMPLETE  Result Date: 06/05/2019    ECHOCARDIOGRAM REPORT   Patient Name:   THIENAN PERSONS Date of Exam: 06/05/2019 Medical Rec #:  [id number]      Height:       71.0 in Accession #:    [id number]     Weight:       231.5 lb Date of Birth:  07-08-1960      BSA:          2.244 m Patient Age:    89 years       BP:           126/67 mmHg Patient Gender: M  HR:           70 bpm. Exam Location:  Inpatient Procedure: 2D Echo, Cardiac Doppler and Color Doppler Indications:    Stroke 434.91/I163.9  History:        Patient has no prior history of Echocardiogram examinations.                 Risk Factors:Former Smoker. CVA.  Sonographer:    Clayton Lefort RDCS (AE) Referring Phys: [phone] MCNEILL P [place]  1. Left ventricular ejection fraction, by estimation, is 65 to 70%. The left ventricle has normal function. The left ventricle has no regional wall motion abnormalities. There is severe left ventricular hypertrophy. Left ventricular diastolic parameters  were normal.  2. Right ventricular systolic function is normal. The right ventricular size is normal.  3. The mitral valve is abnormal. Trivial mitral valve regurgitation.  4. The aortic valve is tricuspid. Aortic valve regurgitation is not visualized. No aortic stenosis is present.  5. The inferior vena cava is  normal in size with <50% respiratory variability, suggesting right atrial pressure of 8 mmHg. FINDINGS  Left Ventricle: Left ventricular ejection fraction, by estimation, is 65 to 70%. The left ventricle has normal function. The left ventricle has no regional wall motion abnormalities. The left ventricular internal cavity size was normal in size. There is  severe left ventricular hypertrophy. Left ventricular diastolic parameters were normal. Right Ventricle: The right ventricular size is normal. No increase in right ventricular wall thickness. Right ventricular systolic function is normal. Left Atrium: Left atrial size was normal in size. Right Atrium: Right atrial size was normal in size. Pericardium: There is no evidence of pericardial effusion. Mitral Valve: The mitral valve is abnormal. There is mild thickening of the mitral valve leaflet(s). Trivial mitral valve regurgitation. MV peak gradient, 3.6 mmHg. The mean mitral valve gradient is 1.0 mmHg. Tricuspid Valve: The tricuspid valve is grossly normal. Tricuspid valve regurgitation is trivial. Aortic Valve: The aortic valve is tricuspid. Aortic valve regurgitation is not visualized. No aortic stenosis is present. Pulmonic Valve: The pulmonic valve was grossly normal. Pulmonic valve regurgitation is not visualized. Aorta: The aortic root, ascending aorta, aortic arch and descending aorta are all structurally normal, with no evidence of dilitation or obstruction. Venous: The inferior vena cava is normal in size with less than 50% respiratory variability, suggesting right atrial pressure of 8 mmHg. IAS/Shunts: The interatrial septum was not well visualized.  LEFT VENTRICLE PLAX 2D LVIDd:         4.02 cm  Diastology LVIDs:         2.40 cm  LV e' lateral:   10.40 cm/s LV PW:         1.68 cm  LV E/e' lateral: 9.0 LV IVS:        1.71 cm  LV e' medial:    9.79 cm/s LVOT diam:     2.10 cm  LV E/e' medial:  9.6 LV SV:         80 LV SV Index:   36 LVOT Area:     3.46 cm   RIGHT VENTRICLE             IVC RV Basal diam:  2.29 cm     IVC diam: 1.86 cm RV S prime:     14.10 cm/s TAPSE (M-mode): 2.7 cm LEFT ATRIUM             Index       RIGHT ATRIUM  Index LA diam:        2.70 cm 1.20 cm/m  RA Area:     11.90 cm LA Vol (A2C):   52.4 ml 23.35 ml/m RA Volume:   23.60 ml  10.52 ml/m LA Vol (A4C):   40.7 ml 18.14 ml/m LA Biplane Vol: 47.6 ml 21.21 ml/m  AORTIC VALVE AV Area (Vmean):   3.20 cm AV Area (VTI):     3.24 cm AV Vmean:          88.200 cm/s AV VTI:            0.246 m LVOT Vmax:         118.00 cm/s LVOT Vmean:        81.500 cm/s LVOT VTI:          0.230 m LVOT/AV VTI ratio: 0.93  AORTA Ao Root diam: 3.40 cm Ao Asc diam:  3.60 cm MITRAL VALVE MV Area (PHT): 3.27 cm    SHUNTS MV Peak grad:  3.6 mmHg    Systemic VTI:  0.23 m MV Mean grad:  1.0 mmHg    Systemic Diam: 2.10 cm MV Vmax:       0.94 m/s MV Vmean:      56.1 cm/s MV Decel Time: 232 msec MV E velocity: 94.10 cm/s MV A velocity: 80.00 cm/s MV E/A ratio:  1.18 Lyman Bishop MD Electronically signed by Lyman Bishop MD Signature Date/Time: 06/05/2019/11:23:20 AM    Final     PHYSICAL EXAM  Temp:  [97.6 F (36.4 C)-98.8 F (37.1 C)] 98.6 F (37 C) (03/16 1200) Pulse Rate:  [60-93] 82 (03/16 1200) Resp:  [11-22] 14 (03/16 1200) BP: (110-160)/(56-101) 143/91 (03/16 1200) SpO2:  [93 %-100 %] 95 % (03/16 1200) Weight:  [105 kg] 105 kg (03/15 1712)  General - Well nourished, well developed, in no apparent distress, mild lethargy.  Ophthalmologic - fundi not visualized due to noncooperation.  Cardiovascular - Regular rhythm and rate.  Neuro - awake alert, lethargic, moderate dysarthria but orientated to place, people, year but not to month. No aphasia, fluent language although paucity of speech, following all simple commands. PERRL, mild right gaze preference, able to gaze to left but not complete. Left hemianopia vs. Visual neglect. Mild left facial droop. Tongue midline. BUE proximal 4/5 with right  mild shoulder pain, RUE distal 5/5 and LUE distal 4/5. BLE 5/5. Sensation symmetrical, coordination intact b/l FTN. Gait not tested.    ASSESSMENT/PLAN [name] is a 59 y.o. male with history of colon cancer in 2012 presenting to [facility] with L sided weakness. Out of window for tPA. CTA w/ R M1 occlusion w/ penumbra. Transferred to [facility] for IR.   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 ASPECTS 10.     CTA head & neck nonocclusive distal R M1 thrombus into bifurcation. Neck ok  CT perfusion 53ml core, 145ml territory at risk  IR mid/distal R M1 occlusion. Mechanical thrombectomy w/ TICI2c revascularization. Dissection flap at site of occlusion. Cerebral angio w/ reocclusion. R M1 stent placed w/ TICI3 flow. Treated w/ Integrilin gtt.   Post IR CT no hemorrhage. pathcy cortical/subcortical R MCA infarct (frontal operculum, insula, temporoparietal jxn). Sinus dz.   Not able to have MRI due to left ankle device  CT head repeat 3/16 pending  LE Doppler  pending  2D Echo EF 65-70%. No source of embolus   Will consider TEE and loop if work up negative  Hypercoagulable labs pending   UDS pending   LDL 126  HgbA1c 8.3  SCDs for VTE prophylaxis  No antithrombotic prior to admission, now on aspirin 325 mg daily and Brilinta (ticagrelor) 90 mg bid following Brilinta load.   Therapy recommendations:  pending  Disposition:  pending   Blood Pressure  Home meds:  None, no hx HTN . BP goal per IR x 24h following IR procedure 120-140  . Long-term BP goal normotensive  Hyperlipidemia  Home meds:  No statin  LDL 126, goal < 70  Added lipitor 80  Continue statin at discharge  Diabetes, new diagnosis  2 random glucoses elevated  Home meds:  None  HgbA1c 8.3, goal <  7.0  CBG  SSI  DM coordinator consult  Need PCP follow up  Dysphagia . Secondary to stroke . NPO, did not pass swallow . NG placed for meds . Speech on board . On IVF . Consider TF if not able to pass swallow in am   Other Stroke Risk Factors  Former Cigarette smoker  ETOH use, alcohol level <10, advised to drink no more than 2 drink(s) a day  Obesity, Body mass index is 32.29 kg/m., recommend weight loss, diet and exercise as appropriate   Other Active Problems  Hx colon cancer 2004 s/p surgical resection and chemo, followed up until 2012, no recurrence as per daughter  Hyponatremia 133->140  Hospital day # 1  This patient is critically ill due to right MCA stroke, uncontrolled diabetes, dysphagia, status post intracranial stent and at significant risk of neurological worsening, death form recurrent stroke, hemorrhagic conversion, seizure, DKA, aspiration pneumonia. This patient's care requires constant monitoring of vital signs, hemodynamics, respiratory and cardiac monitoring, review of multiple databases, neurological assessment, discussion with family, other specialists and medical decision making of high complexity. I spent 40 minutes of neurocritical care time in the care of this patient.  I had long discussion with daughter and patient at bedside, updated pt current condition, treatment plan and potential prognosis, and answered all the questions.  They expressed understanding and appreciation.   Rosalin Hawking, MD PhD Stroke Neurology 06/05/2019 7:35 PM   To contact Stroke Continuity provider, please refer to [email or web address] After hours, contact General Neurology

## 2019-06-05 NOTE — Progress Notes (Addendum)
Daughter at bedside speaking on the phone with brother. Brother was able to get in touch with [employer], was informed it would be okay for pt's left gps ankle bracelet to be cut off and daughter return to department.

## 2019-06-05 NOTE — Progress Notes (Signed)
  Echocardiogram 2D Echocardiogram has been performed.  Scott Day 06/05/2019, 10:52 AM

## 2019-06-05 NOTE — Progress Notes (Signed)
Transferred to 2M04; updated daughter via telephone.   The 2 visitors for tomorrow 06/06/2019 will be the daughter, [name] and his son, Dante Keetch. Jeani Hawking, the girlfriend has been notified that she is to only get phone updates.

## 2019-06-06 ENCOUNTER — Inpatient Hospital Stay (HOSPITAL_COMMUNITY): Payer: Medicaid Other

## 2019-06-06 DIAGNOSIS — I10 Essential (primary) hypertension: Secondary | ICD-10-CM

## 2019-06-06 DIAGNOSIS — I63311 Cerebral infarction due to thrombosis of right middle cerebral artery: Secondary | ICD-10-CM

## 2019-06-06 DIAGNOSIS — I639 Cerebral infarction, unspecified: Secondary | ICD-10-CM

## 2019-06-06 DIAGNOSIS — I63511 Cerebral infarction due to unspecified occlusion or stenosis of right middle cerebral artery: Principal | ICD-10-CM

## 2019-06-06 DIAGNOSIS — E1159 Type 2 diabetes mellitus with other circulatory complications: Secondary | ICD-10-CM

## 2019-06-06 LAB — GLUCOSE, CAPILLARY
Glucose-Capillary: 101 mg/dL — ABNORMAL HIGH (ref 70–99)
Glucose-Capillary: 104 mg/dL — ABNORMAL HIGH (ref 70–99)
Glucose-Capillary: 104 mg/dL — ABNORMAL HIGH (ref 70–99)
Glucose-Capillary: 123 mg/dL — ABNORMAL HIGH (ref 70–99)
Glucose-Capillary: 129 mg/dL — ABNORMAL HIGH (ref 70–99)
Glucose-Capillary: 134 mg/dL — ABNORMAL HIGH (ref 70–99)
Glucose-Capillary: 134 mg/dL — ABNORMAL HIGH (ref 70–99)
Glucose-Capillary: 139 mg/dL — ABNORMAL HIGH (ref 70–99)
Glucose-Capillary: 140 mg/dL — ABNORMAL HIGH (ref 70–99)

## 2019-06-06 LAB — BASIC METABOLIC PANEL
Anion gap: 11 (ref 5–15)
BUN: 6 mg/dL (ref 6–20)
CO2: 21 mmol/L — ABNORMAL LOW (ref 22–32)
Calcium: 8.7 mg/dL — ABNORMAL LOW (ref 8.9–10.3)
Chloride: 107 mmol/L (ref 98–111)
Creatinine, Ser: 0.86 mg/dL (ref 0.61–1.24)
GFR calc Af Amer: >60 mL/min (ref >60)
GFR calc non Af Amer: >60 mL/min (ref >60)
Glucose, Bld: 121 mg/dL — ABNORMAL HIGH (ref 70–99)
Potassium: 3.3 mmol/L — ABNORMAL LOW (ref 3.5–5.1)
Sodium: 139 mmol/L (ref 135–145)

## 2019-06-06 LAB — CBC
HCT: 44.4 % (ref 39.0–52.0)
Hemoglobin: 14.8 g/dL (ref 13.0–17.0)
MCH: 31.6 pg (ref 26.0–34.0)
MCHC: 33.3 g/dL (ref 30.0–36.0)
MCV: 94.9 fL (ref 80.0–100.0)
Platelets: 200 10*3/uL (ref 150–400)
RBC: 4.68 MIL/uL (ref 4.22–5.81)
RDW: 12.2 % (ref 11.5–15.5)
WBC: 7.6 10*3/uL (ref 4.0–10.5)
nRBC: 0 % (ref 0.0–0.2)

## 2019-06-06 MED ORDER — LIVING WELL WITH DIABETES BOOK
Freq: Once | Status: DC
  Filled 2019-06-06: qty 1

## 2019-06-06 NOTE — Consult Note (Signed)
Physical Medicine and Rehabilitation Consult Reason for Consult: Left side weakness Referring Physician: Dr.Xu   HPI: Scott Day is a 59 y.o. right-handed male with history of colon cancer in 2012, GERD, tobacco abuse.  Per chart review lives alone independent prior to admission 1 level home 2 steps to entry.  Presented 06/04/2019 with left-sided weakness.  Cranial CT scan negative for acute changes.  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  Patient did not receive TPA.  Underwent revascularization stent placement per interventional radiology.  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  Echocardiogram with ejection fraction of 70% without emboli.  Lower extremity Dopplers pending.  Admission chemistries unremarkable except glucose 213, sodium 133, urine drug screen negative.  Currently maintained on aspirin and Brilinta for CVA prophylaxis.  Close monitoring of blood pressure with Cardene.  Therapy evaluations completed with recommendations of physical medicine rehab consult.   Review of Systems  Constitutional: Negative for chills and fever.  HENT: Negative for hearing loss.   Eyes: Negative for blurred vision and double vision.  Respiratory: Negative for cough and shortness of breath.   Cardiovascular: Negative for chest pain, palpitations and leg swelling.  Gastrointestinal: Positive for constipation. Negative for heartburn, nausea and vomiting.  Genitourinary: Negative for dysuria, flank pain and hematuria.  Musculoskeletal: Positive for myalgias.  Skin: Negative for rash.  Neurological: Positive for speech change and focal weakness.  All other systems reviewed and are negative.  Past Medical History:  Diagnosis Date  . Cancer [facility] 2012   colon cancer  . Closed fracture of left distal radius   . GERD (gastroesophageal reflux disease)    Past Surgical History:   Procedure Laterality Date  . COLON SURGERY  2012   colon cancer  . IR CT HEAD LTD  06/04/2019  . IR CT HEAD LTD  06/04/2019  . IR INTRA CRAN STENT  06/04/2019  . IR PERCUTANEOUS ART THROMBECTOMY/INFUSION INTRACRANIAL INC DIAG ANGIO  06/04/2019  . IR US GUIDE VASC ACCESS RIGHT  06/04/2019  . OPEN REDUCTION INTERNAL FIXATION (ORIF) DISTAL RADIAL FRACTURE Left 09/20/2017   Procedure: OPEN REDUCTION INTERNAL FIXATION (ORIF) DISTAL RADIAL FRACTURE;  Surgeon: Leanora Cover, MD;  Location: [place];  Service: Orthopedics;  Laterality: Left;  . RADIOLOGY WITH ANESTHESIA N/A 06/04/2019   Procedure: IR WITH ANESTHESIA;  Surgeon: Radiologist, Medication, MD;  Location: [place];  Service: Radiology;  Laterality: N/A;   History reviewed. No pertinent family history. Social History:  reports that he has quit smoking. He has never used smokeless tobacco. He reports current alcohol use. He reports that he does not use drugs. Allergies: No Known Allergies Medications Prior to Admission  Medication Sig Dispense Refill  . HYDROcodone-acetaminophen (NORCO) 5-325 MG tablet 1-2 tabs po q6 hours prn pain (Patient not taking: Reported on 06/04/2019) 30 tablet 0    Home: Home Living Family/patient expects to be discharged to:: Private residence Living Arrangements: Alone(Simultaneous filing. User may not have seen previous data.) Available Help at Discharge: Family, Available 24 hours/day Type of Home: House Home Access: Stairs to enter [employer] of Steps: 2 Home Layout: One level Bathroom Shower/Tub: Chiropodist: Standard Home Equipment: None  Functional History: Prior Function Level of Independence: Independent Comments: ADLs, IADLs, and works in [employer]. Functional Status:  Mobility: Bed Mobility Overal bed mobility: Needs Assistance Bed Mobility: Supine to Sit, Sit to Supine Supine  to sit: Min assist, HOB elevated Sit to supine: Min guard General  bed mobility comments: Min A for elevating trunk. Min guard A for safety in returning to supine Transfers Overall transfer level: Needs assistance Equipment used: 2 person hand held assist Transfers: Sit to/from Stand Sit to Stand: +2 physical assistance, +2 safety/equipment, Min assist, Mod assist General transfer comment: Min A +2 to power up into standing and then Mod A +2 for correction of left lateral lean Ambulation/Gait Ambulation/Gait assistance: Mod assist, +2 physical assistance Gait Distance (Feet): 25 Feet Assistive device: 2 person hand held assist Gait Pattern/deviations: Step-through pattern, Decreased stride length, Decreased weight shift to right, Staggering left, Drifts right/left General Gait Details: decr attn to left environment with running into wall; left lean    ADL: ADL Overall ADL's : Needs assistance/impaired Eating/Feeding: NPO Grooming: Sitting, Minimal assistance, Min guard, Wash/dry face Grooming Details (indicate cue type and reason): Pt wiping his mouth with washclothe in LUE and noting decreased coorindation, maintaining grasp, and dropping to clothe as soon as finished with poor attention to LUE Upper Body Bathing: Minimal assistance, Sitting Lower Body Bathing: Moderate assistance, Sit to/from stand, Maximal assistance, +2 for physical assistance, +2 for safety/equipment Upper Body Dressing : Minimal assistance, Sitting Upper Body Dressing Details (indicate cue type and reason): Overshooting to right with reach RUE into sleeve. Unable to correct with VCs and requiring Min A.  Lower Body Dressing: Maximal assistance, +2 for physical assistance, +2 for safety/equipment, Sit to/from stand Lower Body Dressing Details (indicate cue type and reason): Pt able to sequence donning of socks and requring Max A for don while sitting at EOB.  Toilet Transfer: Moderate assistance, Maximal assistance, +2 for physical assistance, +2 for safety/equipment,  Ambulation Functional mobility during ADLs: Moderate assistance, Maximal assistance, +2 for physical assistance, +2 for safety/equipment General ADL Comments: Pt presenting with inattention to left, poor balance, and visual deficits impacting his performance of ADLs. Highly distracted and poor awareness of deficits.   Cognition: Cognition Overall Cognitive Status: Impaired/Different from baseline Arousal/Alertness: Lethargic Orientation Level: Oriented X4 Attention: Sustained Sustained Attention: Impaired Sustained Attention Impairment: Verbal complex, Functional basic Memory: Impaired Memory Impairment: Storage deficit, Decreased recall of new information, Decreased short term memory Decreased Short Term Memory: Verbal complex, Functional basic Awareness: Impaired Awareness Impairment: Intellectual impairment, Emergent impairment, Anticipatory impairment Problem Solving: Impaired Problem Solving Impairment: Verbal complex, Functional basic Executive Function: Reasoning, Initiating, Self Correcting Reasoning: Impaired Reasoning Impairment: Verbal complex, Functional basic Initiating: Impaired Initiating Impairment: Verbal complex, Functional basic Self Correcting: Impaired Self Correcting Impairment: Verbal complex, Functional basic Cognition Arousal/Alertness: Awake/alert Behavior During Therapy: Impulsive Overall Cognitive Status: Impaired/Different from baseline Area of Impairment: Attention, Following commands, Safety/judgement, Memory, Problem solving, Awareness Current Attention Level: Sustained Memory: Decreased recall of precautions, Decreased short-term memory Following Commands: Follows one step commands inconsistently, Follows one step commands with increased time Safety/Judgement: Decreased awareness of safety, Decreased awareness of deficits Awareness: Intellectual Problem Solving: Slow processing, Difficulty sequencing, Requires verbal cues, Requires tactile  cues General Comments: Pt able to state he is in the hospital for a "stroke, so they say." When asked what symptoms he had from the stroke he denied weakness, vision changes, or balance deficits (even at end of session after mobility required 2 person assist). Pt with poor awareness of his deficits and requiring Mod-Max cues to bring awareness of left side (body and enviroment). Pt quickly distracted and benefiting from calm and quiet environment.  Blood pressure (!) 147/99, pulse 80, temperature  98.5 F (36.9 C), temperature source Oral, resp. rate 17, height 5' 11"  (1.803 m), weight 105 kg, SpO2 96 %.  Physical Exam  General: Alert and oriented x 2, No apparent distress. HEENT: Head is normocephalic, atraumatic, PERRLA, EOMI, sclera anicteric, oral mucosa pink and moist, dentition intact, ext ear canals clear,  Neck: Supple without JVD or lymphadenopathy Heart: Reg rate and rhythm. No murmurs rubs or gallops Chest: CTA bilaterally without wheezes, rales, or rhonchi; no distress Abdomen: Soft, non-tender, non-distended, bowel sounds positive. Extremities: No clubbing, cyanosis, or edema. Pulses are 2+ Skin: Clean and intact without signs of breakdown Neuro: Patient is lethargic but arousable.  Speech is a bit dysarthric but intelligible. Mild left facial droop. Right gaze preference.Provides his name, place, and month but had difficulty with year and month.  Follows simple commands.  MSK: Tenderness to palpation over right shoulder joint. 5/5 strength throughout with exception of R shoulder abduction 4/5 and left wrist extension (4/5)- latter from prior injury.  Psych: Pt's affect is appropriate. Pt is cooperative  Results for orders placed or performed during the hospital encounter of 06/04/19 (from the past 24 hour(s))  Glucose, capillary     Status: Abnormal   Collection Time: 06/05/19  9:51 AM  Result Value Ref Range   Glucose-Capillary 159 (H) 70 - 99 mg/dL  Glucose, capillary      Status: Abnormal   Collection Time: 06/05/19 11:24 AM  Result Value Ref Range   Glucose-Capillary 129 (H) 70 - 99 mg/dL  Rapid urine drug screen (hospital performed)     Status: None   Collection Time: 06/05/19  7:26 PM  Result Value Ref Range   Opiates NONE DETECTED NONE DETECTED   Cocaine NONE DETECTED NONE DETECTED   Benzodiazepines NONE DETECTED NONE DETECTED   Amphetamines NONE DETECTED NONE DETECTED   Tetrahydrocannabinol NONE DETECTED NONE DETECTED   Barbiturates NONE DETECTED NONE DETECTED  Glucose, capillary     Status: Abnormal   Collection Time: 06/06/19 12:05 AM  Result Value Ref Range   Glucose-Capillary 101 (H) 70 - 99 mg/dL   Comment 1 Notify RN   Glucose, capillary     Status: Abnormal   Collection Time: 06/06/19  4:22 AM  Result Value Ref Range   Glucose-Capillary 140 (H) 70 - 99 mg/dL   Comment 1 Notify RN    CT ANGIO HEAD W OR WO CONTRAST  Addendum Date: 06/04/2019   ADDENDUM REPORT: 06/04/2019 12:21 ADDENDUM: Contrast dose is 100 mL Omnipaque 350 Electronically Signed   By: Macy Mis M.D.   On: 06/04/2019 12:21   Result Date: 06/04/2019 CLINICAL DATA:  Code stroke.  Slurred speech and left facial droop EXAM: CT ANGIOGRAPHY HEAD AND NECK CT PERFUSION BRAIN TECHNIQUE: Multidetector CT imaging of the head and neck was performed using the standard protocol during bolus administration of intravenous contrast. Multiplanar CT image reconstructions and MIPs were obtained to evaluate the vascular anatomy. Carotid stenosis measurements (when applicable) are obtained utilizing NASCET criteria, using the distal internal carotid diameter as the denominator. Multiphase CT imaging of the brain was performed following IV bolus contrast injection. Subsequent parametric perfusion maps were calculated using RAPID software. COMPARISON:  None. FINDINGS: CT HEAD Brain: There is no acute intracranial hemorrhage mass effect edema. Gray-white differentiation is preserved. Ventricles and  sulci normal in size and configuration. There is no extra-axial fluid collection. Vascular: Hyperdense vessel. Skull: Unremarkable. Sinuses/Orbits: Lobular mucosal thickening with greatest involvement of the right maxillary sinus. No acute orbital finding.  Other: Mastoid air cells are clear ASPECTS [facility] Stroke Program Early CT Score) - Ganglionic level infarction (caudate, lentiform nuclei, internal capsule, insula, M1-M3 cortex): 7 - Supraganglionic infarction (M4-M6 cortex): 3 Total score (0-10 with 10 being normal): 10 Review of the MIP images confirms the above findings CTA NECK Aortic arch: Great vessel origins are patent. Right carotid system: Patent. There is trace calcified plaque at the ICA origin measurable stenosis. Left carotid system: Patent. Eccentric noncalcified plaque causing less than 50% stenosis. Mild calcified plaque at the ICA origin causing less than 50% stenosis. Vertebral arteries: Patent.  Left vertebral artery dominant. Skeleton: Cervical spine degenerative changes. Other neck: No mass or adenopathy Upper chest: No apical lung mass. Review of the MIP images confirms the above findings CTA HEAD Anterior circulation: Intracranial internal carotid arteries patent. There is nonocclusive thrombus within distal right M1 MCA extending to the bifurcation. Left middle cerebral artery is patent. Anterior cerebral arteries are patent. Congenitally absent right A1 ACA. Posterior circulation: Intracranial vertebral arteries, basilar artery, and posterior cerebral arteries are patent. There are bilateral patent posterior communicating arteries. Venous sinuses: As permitted by contrast timing, patent. Review of the MIP images confirms the above findings CT Brain Perfusion: CBF (<30%) Volume: 35m Perfusion (Tmax>6.0s) volume: 1552mMismatch Volume: 12647mnfarction Location: Right MCA territory IMPRESSION: No acute intracranial hemorrhage or evidence of acute infarction. ASPECT score is 10.  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 Perfusion imaging demonstrates 25 mL core infarction and 126 mL territory at risk in the right MCA territory. No hemodynamically significant stenosis in the neck. These results were called by telephone at the time of interpretation on 06/04/2019 at 11:53 am to provider JONMental Health InstituteLESAlexis Goodellwho verbally acknowledged these results. Electronically Signed: By: PraMacy MisD. On: 06/04/2019 11:59   DG Abd 1 View  Result Date: 06/05/2019 CLINICAL DATA:  New NG tube EXAM: ABDOMEN - 1 VIEW COMPARISON:  None. FINDINGS: Advancement of NG tube such that the tip is at the expected location of the junction of the second third portion the duodenum. IMPRESSION: NG tube extends into duodenum Electronically Signed   By: SteSuzy BouchardD.   On: 06/05/2019 14:28   DG Abd 1 View  Result Date: 06/04/2019 CLINICAL DATA:  Nasogastric tube placement. EXAM: ABDOMEN - 1 VIEW COMPARISON:  None. FINDINGS: A nasogastric tube is seen with its distal tip overlying the body of the stomach. This is approximately 7.8 cm distal to the expected region of the gastroesophageal junction. The bowel gas pattern is normal. No radio-opaque calculi or other significant radiographic abnormality are seen. IMPRESSION: Nasogastric tube positioning, as described above. Further advancement of the NG tube by approximately 5 cm is recommended to decrease the risk of aspiration. Electronically Signed   By: ThaVirgina NorfolkD.   On: 06/04/2019 20:23   CT HEAD WO CONTRAST  Result Date: 06/05/2019 CLINICAL DATA:  Follow-up stroke.  Right MCA territory infarctions. EXAM: CT HEAD WITHOUT CONTRAST TECHNIQUE: Contiguous axial images were obtained from the base of the skull through the vertex without intravenous contrast. COMPARISON:  Multiple CT studies over the last 2 days. FINDINGS: Brain: Patchy low-density in the right middle cerebral artery  territory continues to become more evident by CT consistent with multiple infarctions in that region. These are notable in the insula, right lateral and posterior temporal lobe, right frontal operculum and right frontal parietal junction. No significant swelling, mass effect or any detectable  hemorrhage. Right MCA stent as seen previously. Vascular: There is atherosclerotic calcification of the major vessels at the base of the brain. Right MCA stent as seen previously. Skull: Negative Sinuses/Orbits: Chronic sinusitis particularly affecting the right maxillary sinus. Orbits negative. Other: None IMPRESSION: Multiple infarctions in the right middle cerebral artery territory becoming more evident on CT, with increasing low density. No evidence of significant mass effect or any hemorrhage. Electronically Signed   By: Nelson Chimes M.D.   On: 06/05/2019 15:38   CT HEAD WO CONTRAST  Result Date: 06/04/2019 CLINICAL DATA:  Stroke, follow-up. EXAM: CT HEAD WITHOUT CONTRAST TECHNIQUE: Contiguous axial images were obtained from the base of the skull through the vertex without intravenous contrast. COMPARISON:  Noncontrast head CT and CT angiogram head/neck performed earlier the same day 06/04/2019 FINDINGS: Brain: There has been interval development of patchy acute cortical/subcortical infarction changes within the right MCA vascular territory. Findings are most notable within the right frontal operculum, right insula and within portions of the right temporoparietal junction (series 3, image 20) (series 3, image 18) (series 5, image 48). No evidence of hemorrhagic conversion. No significant mass effect. No midline shift. There is no hydrocephalus. No extra-axial fluid collection is identified. Vascular: A stent is now present in the region of the M1 right middle cerebral artery. Otherwise, no hyperdense vessel is identified. Skull: Normal. Negative for fracture or focal lesion. Sinuses/Orbits: Visualized orbits  demonstrate no acute abnormality. Moderate mucosal thickening and frothy secretions within the right maxillary sinus. Additional mild scattered paranasal sinus mucosal thickening. Left frontal sinus mucous retention cyst. No significant mastoid effusion. IMPRESSION: Interval development of patchy acute cortical/subcortical infarction changes within the right MCA vascular territory most notable within the right frontal operculum, right insula and portions of the right temporoparietal junction. No hemorrhagic conversion. No midline shift or significant mass effect. Paranasal sinus disease as described, most notably right maxillary. Electronically Signed   By: Kellie Simmering DO   On: 06/04/2019 18:43   CT ANGIO NECK W OR WO CONTRAST  Addendum Date: 06/04/2019   ADDENDUM REPORT: 06/04/2019 12:21 ADDENDUM: Contrast dose is 100 mL Omnipaque 350 Electronically Signed   By: Macy Mis M.D.   On: 06/04/2019 12:21   Result Date: 06/04/2019 CLINICAL DATA:  Code stroke.  Slurred speech and left facial droop EXAM: CT ANGIOGRAPHY HEAD AND NECK CT PERFUSION BRAIN TECHNIQUE: Multidetector CT imaging of the head and neck was performed using the standard protocol during bolus administration of intravenous contrast. Multiplanar CT image reconstructions and MIPs were obtained to evaluate the vascular anatomy. Carotid stenosis measurements (when applicable) are obtained utilizing NASCET criteria, using the distal internal carotid diameter as the denominator. Multiphase CT imaging of the brain was performed following IV bolus contrast injection. Subsequent parametric perfusion maps were calculated using RAPID software. COMPARISON:  None. FINDINGS: CT HEAD Brain: There is no acute intracranial hemorrhage mass effect edema. Gray-white differentiation is preserved. Ventricles and sulci normal in size and configuration. There is no extra-axial fluid collection. Vascular: Hyperdense vessel. Skull: Unremarkable. Sinuses/Orbits: Lobular  mucosal thickening with greatest involvement of the right maxillary sinus. No acute orbital finding. Other: Mastoid air cells are clear ASPECTS [facility] Stroke Program Early CT Score) - Ganglionic level infarction (caudate, lentiform nuclei, internal capsule, insula, M1-M3 cortex): 7 - Supraganglionic infarction (M4-M6 cortex): 3 Total score (0-10 with 10 being normal): 10 Review of the MIP images confirms the above findings CTA NECK Aortic arch: Great vessel origins are patent. Right carotid system: Patent.  There is trace calcified plaque at the ICA origin measurable stenosis. Left carotid system: Patent. Eccentric noncalcified plaque causing less than 50% stenosis. Mild calcified plaque at the ICA origin causing less than 50% stenosis. Vertebral arteries: Patent.  Left vertebral artery dominant. Skeleton: Cervical spine degenerative changes. Other neck: No mass or adenopathy Upper chest: No apical lung mass. Review of the MIP images confirms the above findings CTA HEAD Anterior circulation: Intracranial internal carotid arteries patent. There is nonocclusive thrombus within distal right M1 MCA extending to the bifurcation. Left middle cerebral artery is patent. Anterior cerebral arteries are patent. Congenitally absent right A1 ACA. Posterior circulation: Intracranial vertebral arteries, basilar artery, and posterior cerebral arteries are patent. There are bilateral patent posterior communicating arteries. Venous sinuses: As permitted by contrast timing, patent. Review of the MIP images confirms the above findings CT Brain Perfusion: CBF (<30%) Volume: 26m Perfusion (Tmax>6.0s) volume: 1524mMismatch Volume: 12619mnfarction Location: Right MCA territory IMPRESSION: No acute intracranial hemorrhage or evidence of acute infarction. ASPECT score is 10. 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 Perfusion imaging demonstrates 25 mL core infarction and 126 mL territory at risk  in the right MCA territory. No hemodynamically significant stenosis in the neck. These results were called by telephone at the time of interpretation on 06/04/2019 at 11:53 am to provider JONNorth Pointe Surgical CenterLESAlexis Goodellwho verbally acknowledged these results. Electronically Signed: By: PraMacy MisD. On: 06/04/2019 11:59   IR Intra Cran Stent  Result Date: 06/04/2019 CLINICAL DATA:  [age]ar old male with past medical history significant for colon cancer in 2012. He developed left-sided weakness and neglect on awakening this morning. [facility] showed small hypodensity in the right insula with no hemorrhage. NIHSS 10. CT angiogram showed tapering of the right M1/MCA with short-segment filling defect. CT perfusion showed an estimated core volume of 25 mL with a mismatch of 126 mL. He was transferred to our service for emergency treatment of the right M1/MCA occlusion. EXAM: IR CT HEAD LIMITED; IR PERCUTANEOUS ART THORMBECTOMY/INFUSION INTRACRANIAL INCLUDE DIAG ANGIO; IR ULTRASOUND GUIDANCE VASC ACCESS RIGHT; INTRACRANIAL STENT (INCL PTA) ANESTHESIA/SEDATION: General anesthesia. MEDICATIONS: Intra arterial Verapamil 5 mg; intra arterial Integrilin 19 mg. CONTRAST:  5mL46mNIPAQUE IOHEXOL 300 MG/ML SOLN, 75mL69mIPAQUE IOHEXOL 240 MG/ML SOLN PROCEDURE: Operator: Dr. KatyuPedro Earls No healthcare proxy or next of kin available for informed consent. Discussion held with the neurohospitalist attending. Agreement reached that the procedure would be in the best interest of the patient and benefits outweighed risks. The patient was made to lie supine on the angiography table. He was prepped and draped utilizing the usual sterile technique. Using gray scale ultrasound-guided, a micropuncture kit and modified Seldinger technique, access was gained to the right common femoral artery. An 8 French femoral sheath was then placed. Under fluoroscopy, an 8 Frenc[place] balloon guide catheter was navigated over a 6 FrencPakistannstein 2 catheter and a 0.038 inch Terumo Glidewire into the aortic arch. Under fluoroscopy, the catheter was advanced into the right common carotid artery and then into the right internal carotid artery. Frontal and lateral angiograms of the head were obtained showing a distal right M1/MCA occlusion with minimal delayed penetration of contrast. A large bore aspiration catheter was then navigated through the walrus balloon guide catheter and over a phenom 21 microcatheter and a synchro support microguidewire into the cavernous segment of the right ICA. The microcatheter  was then advanced over the microwire into a right M1/MCA middle division branch. A 6 mm solitaire stent retriever was deployed spanning the right M1/MCA and proximal M2/middle division branch. Device was allowed to intercalated with the clot for 4 minutes. The microcatheter was removed under fluoroscopy. The guiding catheter balloon was inflated at the level of the proximal petrous segment. Thrombectomy device and aspiration catheter were then removed under constant aspiration. Follow-up angiogram showed recanalization of the M1 segment with subocclusive filling defect and slow flow in distal MCA branches (TICI 2C). A large bore aspiration catheter was again navigated through the walrus balloon guide catheter and over a phenom 21 microcatheter and a synchro support microguidewire into the cavernous segment of the right ICA. The microcatheter was then advanced over the microwire into a right M1/MCA posterior division branch. A 6 mm solitaire stent retriever was deployed spanning the right M1/MCA and proximal M2 posterior division branch. Device was allowed to intercalated with the clot for 4 minutes. The microcatheter was removed under fluoroscopy. The guiding catheter balloon was inflated at the level of the proximal petrous segment. Thrombectomy device and aspiration catheter were then  removed under constant aspiration. Follow-up angiogram show normal forward flow into the right MCA vascular territory noting a small dissection flap in the mid/distal right M1/MCA segment. Delayed follow-up angiogram showed progressive reocclusion of the right M1/MCA with minimal forward flow. Flat panel CT of the head was obtained and post processed in a separate workstation under concurrent attending physician supervision. No evidence of hemorrhagic complication noted. Right ICA angiograms with frontal lateral views of the head were obtained. Further progression of occlusion in the right M1 segment was noted. Small non flow limiting iatrogenic dissection in the upper cervical segment of the right ICA identified. The large bore aspiration catheter was navigated through the walrus balloon guide catheter and over a SL 10 microcatheter and a synchro support microguidewire into the cavernous segment of the right ICA. The microcatheter was then advanced over the microwire into a right M1/MCA posterior division branch. Intra arterial infusion of 19 mg of Integrilin was performed into the right ICA. Subsequently, a 3 mm x 15 mm atlas intracranial stent was deployed in the M1 segment across the dissection flap. Follow-up angiogram showed adequate stent positioning with complete restoration of the anterograde flow in the right MCA. Delayed follow-up angiogram (x2) show no evidence of reocclusion. 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 Postprocedural flat panel CT of the head was obtained and post processed in a separate workstation under concurrent attending physician supervision. No evidence of hemorrhagic complication noted. The catheter system was withdrawn. A right common femoral artery angiogram was obtained showing access at the level of the right common femoral artery. The femoral sheath was removed and the access was closed with a Perclose ProGlide.  Immediate hemostasis achieved. 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 FINDINGS: Dissection of the mid/distal right M1/MCA. IMPRESSION: 1. Mechanical thrombectomy performed (x2) with temporary restoration of flow revealing dissection flap in the mid/distal right M1/MCA segment. 2. Intracranial stenting placed in the right M1/MCA across the dissection flap with complete restoration of flow. 3. No hemorrhagic complication on postprocedural flat panel CT. No embolus to new territory. PLAN: Patient is to remain on Integrilin drip. A follow-up head CT will be obtained at 6:30 p.m. today. At this point, patient will be re-evaluated for transition to oral dual anti-platelet  therapy. Electronically Signed   By: Pedro Earls M.D.   On: 06/04/2019 17:40   IR CT Head Ltd  Result Date: 06/04/2019 CLINICAL DATA:  59 year old male with past medical history significant for colon cancer in 2012. He developed left-sided weakness and neglect on awakening this morning. He was taken to [facility] hospital. Head CT showed small hypodensity in the right insula with no hemorrhage. NIHSS 10. CT angiogram showed tapering of the right M1/MCA with short-segment filling defect. CT perfusion showed an estimated core volume of 25 mL with a mismatch of 126 mL. He was transferred to our service for emergency treatment of the right M1/MCA occlusion. EXAM: IR CT HEAD LIMITED; IR PERCUTANEOUS ART THORMBECTOMY/INFUSION INTRACRANIAL INCLUDE DIAG ANGIO; IR ULTRASOUND GUIDANCE VASC ACCESS RIGHT; INTRACRANIAL STENT (INCL PTA) ANESTHESIA/SEDATION: General anesthesia. MEDICATIONS: Intra arterial Verapamil 5 mg; intra arterial Integrilin 19 mg. CONTRAST:  46m OMNIPAQUE IOHEXOL 300 MG/ML SOLN, 763mOMNIPAQUE IOHEXOL 240 MG/ML SOLN PROCEDURE: Operator: Dr. KaPedro EarlsMD. No healthcare proxy or next of kin available for informed consent. Discussion held with  the neurohospitalist attending. Agreement reached that the procedure would be in the best interest of the patient and benefits outweighed risks. The patient was made to lie supine on the angiography table. He was prepped and draped utilizing the usual sterile technique. Using gray scale ultrasound-guided, a micropuncture kit and modified Seldinger technique, access was gained to the right common femoral artery. An 8 French femoral sheath was then placed. Under fluoroscopy, an 8 [place] balloon guide catheter was navigated over a 6 FrPakistanerenstein 2 catheter and a 0.038 inch Terumo Glidewire into the aortic arch. Under fluoroscopy, the catheter was advanced into the right common carotid artery and then into the right internal carotid artery. Frontal and lateral angiograms of the head were obtained showing a distal right M1/MCA occlusion with minimal delayed penetration of contrast. A large bore aspiration catheter was then navigated through the walrus balloon guide catheter and over a phenom 21 microcatheter and a synchro support microguidewire into the cavernous segment of the right ICA. The microcatheter was then advanced over the microwire into a right M1/MCA middle division branch. A 6 mm solitaire stent retriever was deployed spanning the right M1/MCA and proximal M2/middle division branch. Device was allowed to intercalated with the clot for 4 minutes. The microcatheter was removed under fluoroscopy. The guiding catheter balloon was inflated at the level of the proximal petrous segment. Thrombectomy device and aspiration catheter were then removed under constant aspiration. Follow-up angiogram showed recanalization of the M1 segment with subocclusive filling defect and slow flow in distal MCA branches (TICI 2C). A large bore aspiration catheter was again navigated through the walrus balloon guide catheter and over a phenom 21 microcatheter and a synchro support microguidewire into the cavernous segment  of the right ICA. The microcatheter was then advanced over the microwire into a right M1/MCA posterior division branch. A 6 mm solitaire stent retriever was deployed spanning the right M1/MCA and proximal M2 posterior division branch. Device was allowed to intercalated with the clot for 4 minutes. The microcatheter was removed under fluoroscopy. The guiding catheter balloon was inflated at the level of the proximal petrous segment. Thrombectomy device and aspiration catheter were then removed under constant aspiration. Follow-up angiogram show normal forward flow into the right MCA vascular territory noting a small dissection flap in the mid/distal right M1/MCA segment. Delayed follow-up angiogram showed progressive reocclusion of the right M1/MCA with minimal  forward flow. Flat panel CT of the head was obtained and post processed in a separate workstation under concurrent attending physician supervision. No evidence of hemorrhagic complication noted. Right ICA angiograms with frontal lateral views of the head were obtained. Further progression of occlusion in the right M1 segment was noted. Small non flow limiting iatrogenic dissection in the upper cervical segment of the right ICA identified. The large bore aspiration catheter was navigated through the walrus balloon guide catheter and over a SL 10 microcatheter and a synchro support microguidewire into the cavernous segment of the right ICA. The microcatheter was then advanced over the microwire into a right M1/MCA posterior division branch. Intra arterial infusion of 19 mg of Integrilin was performed into the right ICA. Subsequently, a 3 mm x 15 mm atlas intracranial stent was deployed in the M1 segment across the dissection flap. Follow-up angiogram showed adequate stent positioning with complete restoration of the anterograde flow in the right MCA. Delayed follow-up angiogram (x2) show no evidence of reocclusion. Small non flow-limiting dissection of the upper  cervical segment of the right ICA remained stable. Postprocedural flat panel CT of the head was obtained and post processed in a separate workstation under concurrent attending physician supervision. No evidence of hemorrhagic complication noted. The catheter system was withdrawn. A right common femoral artery angiogram was obtained showing access at the level of the right common femoral artery. The femoral sheath was removed and the access was closed with a Perclose ProGlide. Immediate hemostasis achieved. 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 FINDINGS: Dissection of the mid/distal right M1/MCA. IMPRESSION: 1. Mechanical thrombectomy performed (x2) with temporary restoration of flow revealing dissection flap in the mid/distal right M1/MCA segment. 2. Intracranial stenting placed in the right M1/MCA across the dissection flap with complete restoration of flow. 3. No hemorrhagic complication on postprocedural flat panel CT. No embolus to new territory. PLAN: Patient is to remain on Integrilin drip. A follow-up head CT will be obtained at 6:30 p.m. today. At this point, patient will be re-evaluated for transition to oral dual anti-platelet therapy. Electronically Signed   By: Pedro Earls M.D.   On: 06/04/2019 17:40   IR CT Head Ltd  Result Date: 06/04/2019 CLINICAL DATA:  59 year old male with past medical history significant for colon cancer in 2012. He developed left-sided weakness and neglect on awakening this morning. He was taken to [facility] hospital. Head CT showed small hypodensity in the right insula with no hemorrhage. NIHSS 10. CT angiogram showed tapering of the right M1/MCA with short-segment filling defect. CT perfusion showed an estimated core volume of 25 mL with a mismatch of 126 mL. He was transferred to our service for emergency treatment of the right M1/MCA occlusion. EXAM: IR CT HEAD LIMITED; IR PERCUTANEOUS  ART THORMBECTOMY/INFUSION INTRACRANIAL INCLUDE DIAG ANGIO; IR ULTRASOUND GUIDANCE VASC ACCESS RIGHT; INTRACRANIAL STENT (INCL PTA) ANESTHESIA/SEDATION: General anesthesia. MEDICATIONS: Intra arterial Verapamil 5 mg; intra arterial Integrilin 19 mg. CONTRAST:  42m OMNIPAQUE IOHEXOL 300 MG/ML SOLN, 764mOMNIPAQUE IOHEXOL 240 MG/ML SOLN PROCEDURE: Operator: Dr. KaPedro EarlsMD. No healthcare proxy or next of kin available for informed consent. Discussion held with the neurohospitalist attending. Agreement reached that the procedure would be in the best interest of the patient and benefits outweighed risks. The patient was made to lie supine on the angiography table. He was prepped and draped utilizing the usual sterile technique. Using gray scale ultrasound-guided, a micropuncture  kit and modified Seldinger technique, access was gained to the right common femoral artery. An 8 French femoral sheath was then placed. Under fluoroscopy, an 8 [place] Walrus balloon guide catheter was navigated over a 6 [place] Berenstein 2 catheter and a 0.038 inch Terumo Glidewire into the aortic arch. Under fluoroscopy, the catheter was advanced into the right common carotid artery and then into the right internal carotid artery. Frontal and lateral angiograms of the head were obtained showing a distal right M1/MCA occlusion with minimal delayed penetration of contrast. A large bore aspiration catheter was then navigated through the walrus balloon guide catheter and over a phenom 21 microcatheter and a synchro support microguidewire into the cavernous segment of the right ICA. The microcatheter was then advanced over the microwire into a right M1/MCA middle division branch. A 6 mm solitaire stent retriever was deployed spanning the right M1/MCA and proximal M2/middle division branch. Device was allowed to intercalated with the clot for 4 minutes. The microcatheter was removed under fluoroscopy. The guiding catheter balloon  was inflated at the level of the proximal petrous segment. Thrombectomy device and aspiration catheter were then removed under constant aspiration. Follow-up angiogram showed recanalization of the M1 segment with subocclusive filling defect and slow flow in distal MCA branches (TICI 2C). A large bore aspiration catheter was again navigated through the walrus balloon guide catheter and over a phenom 21 microcatheter and a synchro support microguidewire into the cavernous segment of the right ICA. The microcatheter was then advanced over the microwire into a right M1/MCA posterior division branch. A 6 mm solitaire stent retriever was deployed spanning the right M1/MCA and proximal M2 posterior division branch. Device was allowed to intercalated with the clot for 4 minutes. The microcatheter was removed under fluoroscopy. The guiding catheter balloon was inflated at the level of the proximal petrous segment. Thrombectomy device and aspiration catheter were then removed under constant aspiration. Follow-up angiogram show normal forward flow into the right MCA vascular territory noting a small dissection flap in the mid/distal right M1/MCA segment. Delayed follow-up angiogram showed progressive reocclusion of the right M1/MCA with minimal forward flow. Flat panel CT of the head was obtained and post processed in a separate workstation under concurrent attending physician supervision. No evidence of hemorrhagic complication noted. Right ICA angiograms with frontal lateral views of the head were obtained. Further progression of occlusion in the right M1 segment was noted. Small non flow limiting iatrogenic dissection in the upper cervical segment of the right ICA identified. The large bore aspiration catheter was navigated through the walrus balloon guide catheter and over a SL 10 microcatheter and a synchro support microguidewire into the cavernous segment of the right ICA. The microcatheter was then advanced over the  microwire into a right M1/MCA posterior division branch. Intra arterial infusion of 19 mg of Integrilin was performed into the right ICA. Subsequently, a 3 mm x 15 mm atlas intracranial stent was deployed in the M1 segment across the dissection flap. Follow-up angiogram showed adequate stent positioning with complete restoration of the anterograde flow in the right MCA. Delayed follow-up angiogram (x2) show no evidence of reocclusion. 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 Postprocedural flat panel CT of the head was obtained and post processed in a separate workstation under concurrent attending physician supervision. No evidence of hemorrhagic complication noted. The catheter system was withdrawn. A right common femoral artery angiogram was obtained showing access at the level of the  right common femoral artery. The femoral sheath was removed and the access was closed with a Perclose ProGlide. Immediate hemostasis achieved. 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 FINDINGS: Dissection of the mid/distal right M1/MCA. IMPRESSION: 1. Mechanical thrombectomy performed (x2) with temporary restoration of flow revealing dissection flap in the mid/distal right M1/MCA segment. 2. Intracranial stenting placed in the right M1/MCA across the dissection flap with complete restoration of flow. 3. No hemorrhagic complication on postprocedural flat panel CT. No embolus to new territory. PLAN: Patient is to remain on Integrilin drip. A follow-up head CT will be obtained at 6:30 p.m. today. At this point, patient will be re-evaluated for transition to oral dual anti-platelet therapy. Electronically Signed   By: Pedro Earls M.D.   On: 06/04/2019 17:40   IR US Guide Vasc Access Right  Result Date: 06/04/2019 CLINICAL DATA:  59 year old male with past medical history significant for colon cancer in  2012. He developed left-sided weakness and neglect on awakening this morning. He was taken to [facility] hospital. Head CT showed small hypodensity in the right insula with no hemorrhage. NIHSS 10. CT angiogram showed tapering of the right M1/MCA with short-segment filling defect. CT perfusion showed an estimated core volume of 25 mL with a mismatch of 126 mL. He was transferred to our service for emergency treatment of the right M1/MCA occlusion. EXAM: IR CT HEAD LIMITED; IR PERCUTANEOUS ART THORMBECTOMY/INFUSION INTRACRANIAL INCLUDE DIAG ANGIO; IR ULTRASOUND GUIDANCE VASC ACCESS RIGHT; INTRACRANIAL STENT (INCL PTA) ANESTHESIA/SEDATION: General anesthesia. MEDICATIONS: Intra arterial Verapamil 5 mg; intra arterial Integrilin 19 mg. CONTRAST:  50m OMNIPAQUE IOHEXOL 300 MG/ML SOLN, 786mOMNIPAQUE IOHEXOL 240 MG/ML SOLN PROCEDURE: Operator: Dr. KaPedro EarlsMD. No healthcare proxy or next of kin available for informed consent. Discussion held with the neurohospitalist attending. Agreement reached that the procedure would be in the best interest of the patient and benefits outweighed risks. The patient was made to lie supine on the angiography table. He was prepped and draped utilizing the usual sterile technique. Using gray scale ultrasound-guided, a micropuncture kit and modified Seldinger technique, access was gained to the right common femoral artery. An 8 French femoral sheath was then placed. Under fluoroscopy, an 8 [place] balloon guide catheter was navigated over a 6 FrPakist[place]renstein 2 catheter and a 0.038 inch Terumo Glidewire into the aortic arch. Under fluoroscopy, the catheter was advanced into the right common carotid artery and then into the right internal carotid artery. Frontal and lateral angiograms of the head were obtained showing a distal right M1/MCA occlusion with minimal delayed penetration of contrast. A large bore aspiration catheter was then navigated through the  walrus balloon guide catheter and over a phenom 21 microcatheter and a synchro support microguidewire into the cavernous segment of the right ICA. The microcatheter was then advanced over the microwire into a right M1/MCA middle division branch. A 6 mm solitaire stent retriever was deployed spanning the right M1/MCA and proximal M2/middle division branch. Device was allowed to intercalated with the clot for 4 minutes. The microcatheter was removed under fluoroscopy. The guiding catheter balloon was inflated at the level of the proximal petrous segment. Thrombectomy device and aspiration catheter were then removed under constant aspiration. Follow-up angiogram showed recanalization of the M1 segment with subocclusive filling defect and slow flow in distal MCA branches (TICI 2C). A large bore aspiration catheter was again navigated through the walrus balloon guide catheter and  over a phenom 21 microcatheter and a synchro support microguidewire into the cavernous segment of the right ICA. The microcatheter was then advanced over the microwire into a right M1/MCA posterior division branch. A 6 mm solitaire stent retriever was deployed spanning the right M1/MCA and proximal M2 posterior division branch. Device was allowed to intercalated with the clot for 4 minutes. The microcatheter was removed under fluoroscopy. The guiding catheter balloon was inflated at the level of the proximal petrous segment. Thrombectomy device and aspiration catheter were then removed under constant aspiration. Follow-up angiogram show normal forward flow into the right MCA vascular territory noting a small dissection flap in the mid/distal right M1/MCA segment. Delayed follow-up angiogram showed progressive reocclusion of the right M1/MCA with minimal forward flow. Flat panel CT of the head was obtained and post processed in a separate workstation under concurrent attending physician supervision. No evidence of hemorrhagic complication noted.  Right ICA angiograms with frontal lateral views of the head were obtained. Further progression of occlusion in the right M1 segment was noted. Small non flow limiting iatrogenic dissection in the upper cervical segment of the right ICA identified. The large bore aspiration catheter was navigated through the walrus balloon guide catheter and over a SL 10 microcatheter and a synchro support microguidewire into the cavernous segment of the right ICA. The microcatheter was then advanced over the microwire into a right M1/MCA posterior division branch. Intra arterial infusion of 19 mg of Integrilin was performed into the right ICA. Subsequently, a 3 mm x 15 mm atlas intracranial stent was deployed in the M1 segment across the dissection flap. Follow-up angiogram showed adequate stent positioning with complete restoration of the anterograde flow in the right MCA. Delayed follow-up angiogram (x2) show no evidence of reocclusion. 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 Postprocedural flat panel CT of the head was obtained and post processed in a separate workstation under concurrent attending physician supervision. No evidence of hemorrhagic complication noted. The catheter system was withdrawn. A right common femoral artery angiogram was obtained showing access at the level of the right common femoral artery. The femoral sheath was removed and the access was closed with a Perclose ProGlide. Immediate hemostasis achieved. 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 FINDINGS: Dissection of the mid/distal right M1/MCA. IMPRESSION: 1. Mechanical thrombectomy performed (x2) with temporary restoration of flow revealing dissection flap in the mid/distal right M1/MCA segment. 2. Intracranial stenting placed in the right M1/MCA across the dissection flap with complete restoration of flow. 3. No hemorrhagic complication  on postprocedural flat panel CT. No embolus to new territory. PLAN: Patient is to remain on Integrilin drip. A follow-up head CT will be obtained at 6:30 p.m. today. At this point, patient will be re-evaluated for transition to oral dual anti-platelet therapy. Electronically Signed   By: Pedro Earls M.D.   On: 06/04/2019 17:40   CT CEREBRAL PERFUSION W CONTRAST  Addendum Date: 06/04/2019   ADDENDUM REPORT: 06/04/2019 12:21 ADDENDUM: Contrast dose is 100 mL Omnipaque 350 Electronically Signed   By: Macy Mis M.D.   On: 06/04/2019 12:21   Result Date: 06/04/2019 CLINICAL DATA:  Code stroke.  Slurred speech and left facial droop EXAM: CT ANGIOGRAPHY HEAD AND NECK CT PERFUSION BRAIN TECHNIQUE: Multidetector CT imaging of the head and neck was performed using the standard protocol during bolus administration of intravenous contrast. Multiplanar CT image reconstructions and  MIPs were obtained to evaluate the vascular anatomy. Carotid stenosis measurements (when applicable) are obtained utilizing NASCET criteria, using the distal internal carotid diameter as the denominator. Multiphase CT imaging of the brain was performed following IV bolus contrast injection. Subsequent parametric perfusion maps were calculated using RAPID software. COMPARISON:  None. FINDINGS: CT HEAD Brain: There is no acute intracranial hemorrhage mass effect edema. Gray-white differentiation is preserved. Ventricles and sulci normal in size and configuration. There is no extra-axial fluid collection. Vascular: Hyperdense vessel. Skull: Unremarkable. Sinuses/Orbits: Lobular mucosal thickening with greatest involvement of the right maxillary sinus. No acute orbital finding. Other: Mastoid air cells are clear ASPECTS [facility] Stroke Program Early CT Score) - Ganglionic level infarction (caudate, lentiform nuclei, internal capsule, insula, M1-M3 cortex): 7 - Supraganglionic infarction (M4-M6 cortex): 3 Total score (0-10 with  10 being normal): 10 Review of the MIP images confirms the above findings CTA NECK Aortic arch: Great vessel origins are patent. Right carotid system: Patent. There is trace calcified plaque at the ICA origin measurable stenosis. Left carotid system: Patent. Eccentric noncalcified plaque causing less than 50% stenosis. Mild calcified plaque at the ICA origin causing less than 50% stenosis. Vertebral arteries: Patent.  Left vertebral artery dominant. Skeleton: Cervical spine degenerative changes. Other neck: No mass or adenopathy Upper chest: No apical lung mass. Review of the MIP images confirms the above findings CTA HEAD Anterior circulation: Intracranial internal carotid arteries patent. There is nonocclusive thrombus within distal right M1 MCA extending to the bifurcation. Left middle cerebral artery is patent. Anterior cerebral arteries are patent. Congenitally absent right A1 ACA. Posterior circulation: Intracranial vertebral arteries, basilar artery, and posterior cerebral arteries are patent. There are bilateral patent posterior communicating arteries. Venous sinuses: As permitted by contrast timing, patent. Review of the MIP images confirms the above findings CT Brain Perfusion: CBF (<30%) Volume: 69m Perfusion (Tmax>6.0s) volume: 1533mMismatch Volume: 12675mnfarction Location: Right MCA territory IMPRESSION: No acute intracranial hemorrhage or evidence of acute infarction. ASPECT score is 10. 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 Perfusion imaging demonstrates 25 mL core infarction and 126 mL territory at risk in the right MCA territory. No hemodynamically significant stenosis in the neck. These results were called by telephone at the time of interpretation on 06/04/2019 at 11:53 am to provider JONAgh Laveen LLCLESAlexis Goodellwho verbally acknowledged these results. Electronically Signed: By: PraMacy MisD. On: 06/04/2019 11:59   DG CHEST PORT 1  VIEW  Result Date: 06/05/2019 CLINICAL DATA:  NG tube placement. EXAM: PORTABLE CHEST 1 VIEW COMPARISON:  CT angiogram of the chest dated 09/13/2002 FINDINGS: NG tube tip is below the diaphragm. Heart size and pulmonary vascularity are normal. Lungs are clear. No significant bone abnormality. IMPRESSION: Normal chest.  NG tube tip below the diaphragm. Electronically Signed   By: JamLorriane ShireD.   On: 06/05/2019 14:29   DG Abd Portable 1V  Result Date: 06/05/2019 CLINICAL DATA:  Nasogastric tube placement. EXAM: PORTABLE ABDOMEN - 1 VIEW COMPARISON:  Same day. FINDINGS: The bowel gas pattern is normal. Distal tip of nasogastric tube is seen in expected position of the gastroesophageal junction. No radio-opaque calculi or other significant radiographic abnormality are seen. IMPRESSION: Distal tip of nasogastric tube seen in expected position of gastroesophageal junction. Proximal side hole is seen in distal esophagus. Advancement is recommended. Electronically Signed   By: JamMarijo ConceptionD.   On: 06/05/2019 12:01   DG Abd Portable 1V  Result Date: 06/05/2019 CLINICAL DATA:  Nasogastric tube placement EXAM: PORTABLE ABDOMEN - 1 VIEW COMPARISON:  Portable exam 1050 hours compared to 0858 hours FINDINGS: Tip of nasogastric tube projects over stomach though the proximal side-port projects over the distal esophagus; recommend advancing tube 5 cm. Nonobstructive bowel gas pattern. No bowel dilatation or bowel wall thickening. Osseous structures unremarkable. IMPRESSION: Proximal side-port of nasogastric tube projects over distal esophagus; recommend advancing tube 5 cm. Electronically Signed   By: Lavonia Dana M.D.   On: 06/05/2019 11:00   DG Abd Portable 1V  Result Date: 06/05/2019 CLINICAL DATA:  NG tube placement EXAM: PORTABLE ABDOMEN - 1 VIEW COMPARISON:  06/04/2019 FINDINGS: Esophagogastric tube remains with tip below the diaphragm and side port above the gastroesophageal junction. General paucity  of bowel although scattered gas is gas present to the rectum. No free air on supine radiographs. IMPRESSION: Esophagogastric tube remains with tip below the diaphragm and side port above the gastroesophageal junction. Recommend advancement to ensure subdiaphragmatic positioning of tip and side port. Electronically Signed   By: Eddie Candle M.D.   On: 06/05/2019 09:25   ECHOCARDIOGRAM COMPLETE  Result Date: 06/05/2019    ECHOCARDIOGRAM REPORT   Patient Name:   HERO KULISH Date of Exam: 06/05/2019 Medical Rec #:  [id number]      Height:       71.0 in Accession #:    [id number]     Weight:       231.5 lb Date of Birth:  [birth year]      BSA:          2.244 m Patient Age:    71 years       BP:           126/67 mmHg Patient Gender: M              HR:           70 bpm. Exam Location:  Inpatient Procedure: 2D Echo, Cardiac Doppler and Color Doppler Indications:    Stroke 434.91/I163.9  History:        Patient has no prior history of Echocardiogram examinations.                 Risk Factors:Former Smoker. CVA.  Sonographer:    Clayton Lefort RDCS (AE) Referring Phys: [phone] MCNEILL P [place]  1. Left ventricular ejection fraction, by estimation, is 65 to 70%. The left ventricle has normal function. The left ventricle has no regional wall motion abnormalities. There is severe left ventricular hypertrophy. Left ventricular diastolic parameters  were normal.  2. Right ventricular systolic function is normal. The right ventricular size is normal.  3. The mitral valve is abnormal. Trivial mitral valve regurgitation.  4. The aortic valve is tricuspid. Aortic valve regurgitation is not visualized. No aortic stenosis is present.  5. The inferior vena cava is normal in size with <50% respiratory variability, suggesting right atrial pressure of 8 mmHg. FINDINGS  Left Ventricle: Left ventricular ejection fraction, by estimation, is 65 to 70%. The left ventricle has normal function. The left ventricle has no regional wall  motion abnormalities. The left ventricular internal cavity size was normal in size. There is  severe left ventricular hypertrophy. Left ventricular diastolic parameters were normal. Right Ventricle: The right ventricular size is normal. No increase in right ventricular wall thickness. Right ventricular systolic function is normal. Left Atrium: Left atrial size was normal in size. Right Atrium: Right atrial size was normal in size.  Pericardium: There is no evidence of pericardial effusion. Mitral Valve: The mitral valve is abnormal. There is mild thickening of the mitral valve leaflet(s). Trivial mitral valve regurgitation. MV peak gradient, 3.6 mmHg. The mean mitral valve gradient is 1.0 mmHg. Tricuspid Valve: The tricuspid valve is grossly normal. Tricuspid valve regurgitation is trivial. Aortic Valve: The aortic valve is tricuspid. Aortic valve regurgitation is not visualized. No aortic stenosis is present. Pulmonic Valve: The pulmonic valve was grossly normal. Pulmonic valve regurgitation is not visualized. Aorta: The aortic root, ascending aorta, aortic arch and descending aorta are all structurally normal, with no evidence of dilitation or obstruction. Venous: The inferior vena cava is normal in size with less than 50% respiratory variability, suggesting right atrial pressure of 8 mmHg. IAS/Shunts: The interatrial septum was not well visualized.  LEFT VENTRICLE PLAX 2D LVIDd:         4.02 cm  Diastology LVIDs:         2.40 cm  LV e' lateral:   10.40 cm/s LV PW:         1.68 cm  LV E/e' lateral: 9.0 LV IVS:        1.71 cm  LV e' medial:    9.79 cm/s LVOT diam:     2.10 cm  LV E/e' medial:  9.6 LV SV:         80 LV SV Index:   36 LVOT Area:     3.46 cm  RIGHT VENTRICLE             IVC RV Basal diam:  2.29 cm     IVC diam: 1.86 cm RV S prime:     14.10 cm/s TAPSE (M-mode): 2.7 cm LEFT ATRIUM             Index       RIGHT ATRIUM           Index LA diam:        2.70 cm 1.20 cm/m  RA Area:     11.90 cm LA Vol  (A2C):   52.4 ml 23.35 ml/m RA Volume:   23.60 ml  10.52 ml/m LA Vol (A4C):   40.7 ml 18.14 ml/m LA Biplane Vol: 47.6 ml 21.21 ml/m  AORTIC VALVE AV Area (Vmean):   3.20 cm AV Area (VTI):     3.24 cm AV Vmean:          88.200 cm/s AV VTI:            0.246 m LVOT Vmax:         118.00 cm/s LVOT Vmean:        81.500 cm/s LVOT VTI:          0.230 m LVOT/AV VTI ratio: 0.93  AORTA Ao Root diam: 3.40 cm Ao Asc diam:  3.60 cm MITRAL VALVE MV Area (PHT): 3.27 cm    SHUNTS MV Peak grad:  3.6 mmHg    Systemic VTI:  0.23 m MV Mean grad:  1.0 mmHg    Systemic Diam: 2.10 cm MV Vmax:       0.94 m/s MV Vmean:      56.1 cm/s MV Decel Time: 232 msec MV E velocity: 94.10 cm/s MV A velocity: 80.00 cm/s MV E/A ratio:  1.18 Lyman Bishop MD Electronically signed by Lyman Bishop MD Signature Date/Time: 06/05/2019/11:23:20 AM    Final    IR PERCUTANEOUS ART THROMBECTOMY/INFUSION INTRACRANIAL INC DIAG ANGIO  Result Date: 06/04/2019 CLINICAL DATA:  59 year old male with  past medical history significant for colon cancer in 2012. He developed left-sided weakness and neglect on awakening this morning. He was taken to [facility] hospital. Head CT showed small hypodensity in the right insula with no hemorrhage. NIHSS 10. CT angiogram showed tapering of the right M1/MCA with short-segment filling defect. CT perfusion showed an estimated core volume of 25 mL with a mismatch of 126 mL. He was transferred to our service for emergency treatment of the right M1/MCA occlusion. EXAM: IR CT HEAD LIMITED; IR PERCUTANEOUS ART THORMBECTOMY/INFUSION INTRACRANIAL INCLUDE DIAG ANGIO; IR ULTRASOUND GUIDANCE VASC ACCESS RIGHT; INTRACRANIAL STENT (INCL PTA) ANESTHESIA/SEDATION: General anesthesia. MEDICATIONS: Intra arterial Verapamil 5 mg; intra arterial Integrilin 19 mg. CONTRAST:  18m OMNIPAQUE IOHEXOL 300 MG/ML SOLN, 715mOMNIPAQUE IOHEXOL 240 MG/ML SOLN PROCEDURE: Operator: Dr. KaPedro EarlsMD. No healthcare proxy or next of  kin available for informed consent. Discussion held with the neurohospitalist attending. Agreement reached that the procedure would be in the best interest of the patient and benefits outweighed risks. The patient was made to lie supine on the angiography table. He was prepped and draped utilizing the usual sterile technique. Using gray scale ultrasound-guided, a micropuncture kit and modified Seldinger technique, access was gained to the right common femoral artery. An 8 French femoral sheath was then placed. Under fluoroscopy, an 8 FrPakistanalrus balloon guide catheter was navigated over a 6 FrPakistanerenstein 2 catheter and a 0.038 inch Terumo Glidewire into the aortic arch. Under fluoroscopy, the catheter was advanced into the right common carotid artery and then into the right internal carotid artery. Frontal and lateral angiograms of the head were obtained showing a distal right M1/MCA occlusion with minimal delayed penetration of contrast. A large bore aspiration catheter was then navigated through the walrus balloon guide catheter and over a phenom 21 microcatheter and a synchro support microguidewire into the cavernous segment of the right ICA. The microcatheter was then advanced over the microwire into a right M1/MCA middle division branch. A 6 mm solitaire stent retriever was deployed spanning the right M1/MCA and proximal M2/middle division branch. Device was allowed to intercalated with the clot for 4 minutes. The microcatheter was removed under fluoroscopy. The guiding catheter balloon was inflated at the level of the proximal petrous segment. Thrombectomy device and aspiration catheter were then removed under constant aspiration. Follow-up angiogram showed recanalization of the M1 segment with subocclusive filling defect and slow flow in distal MCA branches (TICI 2C). A large bore aspiration catheter was again navigated through the walrus balloon guide catheter and over a phenom 21 microcatheter and a  synchro support microguidewire into the cavernous segment of the right ICA. The microcatheter was then advanced over the microwire into a right M1/MCA posterior division branch. A 6 mm solitaire stent retriever was deployed spanning the right M1/MCA and proximal M2 posterior division branch. Device was allowed to intercalated with the clot for 4 minutes. The microcatheter was removed under fluoroscopy. The guiding catheter balloon was inflated at the level of the proximal petrous segment. Thrombectomy device and aspiration catheter were then removed under constant aspiration. Follow-up angiogram show normal forward flow into the right MCA vascular territory noting a small dissection flap in the mid/distal right M1/MCA segment. Delayed follow-up angiogram showed progressive reocclusion of the right M1/MCA with minimal forward flow. Flat panel CT of the head was obtained and post processed in a separate workstation under concurrent attending physician supervision. No evidence of hemorrhagic complication noted. Right ICA angiograms with frontal  lateral views of the head were obtained. Further progression of occlusion in the right M1 segment was noted. Small non flow limiting iatrogenic dissection in the upper cervical segment of the right ICA identified. The large bore aspiration catheter was navigated through the walrus balloon guide catheter and over a SL 10 microcatheter and a synchro support microguidewire into the cavernous segment of the right ICA. The microcatheter was then advanced over the microwire into a right M1/MCA posterior division branch. Intra arterial infusion of 19 mg of Integrilin was performed into the right ICA. Subsequently, a 3 mm x 15 mm atlas intracranial stent was deployed in the M1 segment across the dissection flap. Follow-up angiogram showed adequate stent positioning with complete restoration of the anterograde flow in the right MCA. Delayed follow-up angiogram (x2) show no evidence of  reocclusion. 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 Postprocedural flat panel CT of the head was obtained and post processed in a separate workstation under concurrent attending physician supervision. No evidence of hemorrhagic complication noted. The catheter system was withdrawn. A right common femoral artery angiogram was obtained showing access at the level of the right common femoral artery. The femoral sheath was removed and the access was closed with a Perclose ProGlide. Immediate hemostasis achieved. 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 FINDINGS: Dissection of the mid/distal right M1/MCA. IMPRESSION: 1. Mechanical thrombectomy performed (x2) with temporary restoration of flow revealing dissection flap in the mid/distal right M1/MCA segment. 2. Intracranial stenting placed in the right M1/MCA across the dissection flap with complete restoration of flow. 3. No hemorrhagic complication on postprocedural flat panel CT. No embolus to new territory. PLAN: Patient is to remain on Integrilin drip. A follow-up head CT will be obtained at 6:30 p.m. today. At this point, patient will be re-evaluated for transition to oral dual anti-platelet therapy. Electronically Signed   By: Pedro Earls M.D.   On: 06/04/2019 17:40   CT HEAD CODE STROKE WO CONTRAST  Addendum Date: 06/04/2019   ADDENDUM REPORT: 06/04/2019 12:21 ADDENDUM: Contrast dose is 100 mL Omnipaque 350 Electronically Signed   By: Macy Mis M.D.   On: 06/04/2019 12:21   Result Date: 06/04/2019 CLINICAL DATA:  Code stroke.  Slurred speech and left facial droop EXAM: CT ANGIOGRAPHY HEAD AND NECK CT PERFUSION BRAIN TECHNIQUE: Multidetector CT imaging of the head and neck was performed using the standard protocol during bolus administration of intravenous contrast. Multiplanar CT image reconstructions and MIPs were  obtained to evaluate the vascular anatomy. Carotid stenosis measurements (when applicable) are obtained utilizing NASCET criteria, using the distal internal carotid diameter as the denominator. Multiphase CT imaging of the brain was performed following IV bolus contrast injection. Subsequent parametric perfusion maps were calculated using RAPID software. COMPARISON:  None. FINDINGS: CT HEAD Brain: There is no acute intracranial hemorrhage mass effect edema. Gray-white differentiation is preserved. Ventricles and sulci normal in size and configuration. There is no extra-axial fluid collection. Vascular: Hyperdense vessel. Skull: Unremarkable. Sinuses/Orbits: Lobular mucosal thickening with greatest involvement of the right maxillary sinus. No acute orbital finding. Other: Mastoid air cells are clear ASPECTS [facility] Stroke Program Early CT Score) - Ganglionic level infarction (caudate, lentiform nuclei, internal capsule, insula, M1-M3 cortex): 7 - Supraganglionic infarction (M4-M6 cortex): 3 Total score (0-10 with 10 being normal): 10 Review of the MIP images confirms the above findings CTA NECK Aortic arch: Great vessel origins  are patent. Right carotid system: Patent. There is trace calcified plaque at the ICA origin measurable stenosis. Left carotid system: Patent. Eccentric noncalcified plaque causing less than 50% stenosis. Mild calcified plaque at the ICA origin causing less than 50% stenosis. Vertebral arteries: Patent.  Left vertebral artery dominant. Skeleton: Cervical spine degenerative changes. Other neck: No mass or adenopathy Upper chest: No apical lung mass. Review of the MIP images confirms the above findings CTA HEAD Anterior circulation: Intracranial internal carotid arteries patent. There is nonocclusive thrombus within distal right M1 MCA extending to the bifurcation. Left middle cerebral artery is patent. Anterior cerebral arteries are patent. Congenitally absent right A1 ACA. Posterior  circulation: Intracranial vertebral arteries, basilar artery, and posterior cerebral arteries are patent. There are bilateral patent posterior communicating arteries. Venous sinuses: As permitted by contrast timing, patent. Review of the MIP images confirms the above findings CT Brain Perfusion: CBF (<30%) Volume: 65m Perfusion (Tmax>6.0s) volume: 1574mMismatch Volume: 12629mnfarction Location: Right MCA territory IMPRESSION: No acute intracranial hemorrhage or evidence of acute infarction. ASPECT score is 10. 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 Perfusion imaging demonstrates 25 mL core infarction and 126 mL territory at risk in the right MCA territory. No hemodynamically significant stenosis in the neck. These results were called by telephone at the time of interpretation on 06/04/2019 at 11:53 am to provider JONSan Angelo Community Medical CenterLESAlexis Goodellwho verbally acknowledged these results. Electronically Signed: By: PraMacy MisD. On: 06/04/2019 11:59     Assessment/Plan: Diagnosis: R MCA infarct with right M1 distal occlusion  1. Does the need for close, 24 hr/day medical supervision in concert with the patient's rehab needs make it unreasonable for this patient to be served in a less intensive setting? Yes 2. Co-Morbidities requiring supervision/potential complications: HLD, new-onset DM type 2, dysphagia, obesity, ETOH use, former cigarette smoker 3. Due to bladder management, bowel management, safety, skin/wound care, disease management, medication administration, pain management and patient education, does the patient require 24 hr/day rehab nursing? Yes 4. Does the patient require coordinated care of a physician, rehab nurse, therapy disciplines of PT, OT, SLP to address physical and functional deficits in the context of the above medical diagnosis(es)? Yes Addressing deficits in the following areas: balance, endurance, locomotion, strength,  transferring, bowel/bladder control, bathing, dressing, feeding, grooming, toileting, cognition, speech, language, swallowing and psychosocial support 5. Can the patient actively participate in an intensive therapy program of at least 3 hrs of therapy per day at least 5 days per week? Yes 6. The potential for patient to make measurable gains while on inpatient rehab is excellent 7. Anticipated functional outcomes upon discharge from inpatient rehab are modified independent  with PT, modified independent with OT, modified independent with SLP. 8. Estimated rehab length of stay to reach the above functional goals is: 20-22 days 9. Anticipated discharge destination: Home 10. Overall Rehab/Functional Prognosis: excellent  RECOMMENDATIONS: This patient's condition is appropriate for continued rehabilitative care in the following setting: CIR Patient has agreed to participate in recommended program. Yes Note that insurance prior authorization may be required for reimbursement for recommended care.  Comment: Mr. BarSharronuld be an excellent CIR candidate. He will have support from his daughter upon discharge.   For his right shoulder pain, may consider getting XR, applying lidocaine patch, and diclofenac gel.   Thank you for this consult. We will continue to follow in Mr. BarBisigre.  DanLavon Paganinigiulli, PA-C 06/06/2019   I  have personally performed a face to face diagnostic evaluation, including, but not limited to relevant history and physical exam findings, of this patient and developed relevant assessment and plan.  Additionally, I have reviewed and concur with the physician assistant's documentation above.  Leeroy Cha, MD

## 2019-06-06 NOTE — Progress Notes (Signed)
Inpatient Rehab Admissions:  Inpatient Rehab Consult received.  I met with pt at the bedside for rehabilitation assessment and to discuss goals and expectations of an inpatient rehab admission.  Pt eating a meal (dysphagia 3/thin liquid).  Pt's daughter and son were present.  Daughter acknowledged understanding of inpatient rehab goals and expectations.  Pt currently mod A +2  with ambulation and mod-max A with LB ADLs. Pt/family interested in CIR.  Will continue to monitor pt's progress and appropriateness for potential admit to CIR. In process of contacting financial counselor for eligibility for assistance programs due to uninsured status.   Signed: Gayland Curry, [place], [place] Admissions Coordinator [phone]

## 2019-06-06 NOTE — Progress Notes (Addendum)
Inpatient Diabetes Program Recommendations  AACE/ADA: New Consensus Statement on Inpatient Glycemic Control (2015)  Target Ranges:  Prepandial:   less than 140 mg/dL      Peak postprandial:   less than 180 mg/dL (1-2 hours)      Critically ill patients:  140 - 180 mg/dL   Lab Results  Component Value Date   GLUCAP 104 (H) 06/06/2019   HGBA1C 8.3 (H) 06/05/2019    Review of Glycemic Control Results for JABIN, KALICH (MRN [id number]) as of 06/06/2019 11:08  Ref. Range 06/06/2019 00:05 06/06/2019 04:22 06/06/2019 07:57  Glucose-Capillary Latest Ref Range: 70 - 99 mg/dL 101 (H) 140 (H) 104 (H)   Diabetes history: New onset DM Outpatient Diabetes medications: none Current orders for Inpatient glycemic control: Novolog 0-15 units Q4H  Inpatient Diabetes Program Recommendations:    Noted consult for new onset DM. Will order [facility], dietitian consult. Will plan to see 3/18.   Thanks, Bronson Curb, MSN, RNC-OB Diabetes Coordinator [phone] (8a-5p)

## 2019-06-06 NOTE — Progress Notes (Signed)
Physical Therapy Treatment Patient Details Name: Scott Day MRN: [id number] DOB: 1960/08/09 Today's Date: 06/06/2019    History of Present Illness 59 y.o. male with a history of colon cancer in 2012 and GERD, who presents with left-sided weakness. 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     PT Comments    Pt is making progress towards his goals but still exhibits L sided weakness, L sided neglect, double vision, increased impulsivity and decreased awareness of his deficits. Pt was minAx2 to come to EoB, and sit to stand, started with +2 HHA able to progress to 1 person HHA on L but requires modA due to L lateral lean and constant cuing for scanning environment so as not to run into things. D/c plans remain appropriate at this time. PT will continue to follow acutely.      Follow Up Recommendations  CIR;Supervision/Assistance - 24 hour     Equipment Recommendations  Other (comment)(TBD)    Recommendations for Other Services Rehab consult     Precautions / Restrictions Precautions Precautions: Fall;Other (comment) Precaution Comments: Inattention of left Restrictions Weight Bearing Restrictions: No    Mobility  Bed Mobility Overal bed mobility: Needs Assistance Bed Mobility: Supine to Sit;Sit to Supine     Supine to sit: Min assist;HOB elevated     General bed mobility comments: Min A for elevating trunk.   Transfers Overall transfer level: Needs assistance Equipment used: 2 person hand held assist;1 person hand held assist Transfers: Sit to/from Stand Sit to Stand: +2 physical assistance;Min assist         General transfer comment: min A for power up and steadying from bed surface   Ambulation/Gait Ambulation/Gait assistance: +2 physical assistance;Mod assist;Min assist Gait Distance (Feet): 150 Feet Assistive device: 2 person hand held assist;1 person hand held assist Gait Pattern/deviations: Step-through  pattern;Decreased stride length;Decreased weight shift to right;Staggering left;Drifts right/left Gait velocity: slowed Gait velocity interpretation: <1.8 ft/sec, indicate of risk for recurrent falls General Gait Details: started with 2 person assit able to progress to heavy modA of 1 person HHA on L side, constant verbal cuing for scanning environment and providing enought clearance to not run PT into the wall, excessive L lateral lean       Modified Rankin (Stroke Patients Only) Modified Rankin (Stroke Patients Only) Pre-Morbid Rankin Score: No symptoms Modified Rankin: Moderately severe disability     Balance Overall balance assessment: Needs assistance Sitting-balance support: No upper extremity supported;Feet supported Sitting balance-Leahy Scale: Poor Sitting balance - Comments: Poor trunk control with lateral leaning   Standing balance support: Single extremity supported;During functional activity;No upper extremity supported Standing balance-Leahy Scale: Poor Standing balance comment: left lateral lean. Reliant on physical A to maintain balance due to decr awareness                            Cognition Arousal/Alertness: Awake/alert Behavior During Therapy: Impulsive Overall Cognitive Status: Impaired/Different from baseline Area of Impairment: Attention;Following commands;Safety/judgement;Memory;Problem solving;Awareness                   Current Attention Level: Sustained Memory: Decreased recall of precautions;Decreased short-term memory Following Commands: Follows one step commands inconsistently;Follows one step commands with increased time Safety/Judgement: Decreased awareness of safety;Decreased awareness of deficits Awareness: Intellectual Problem Solving: Slow processing;Difficulty sequencing;Requires verbal cues;Requires tactile cues General Comments: Continued impulsivity and poor awareness of deficits, constant cuing to stay on task at  hand,       Exercises      General Comments General comments (skin integrity, edema, etc.): VSS on RA, pt son and daughter present assisting with chair follow      Pertinent Vitals/Pain Pain Assessment: Faces Faces Pain Scale: Hurts a little bit Pain Descriptors / Indicators: Grimacing Pain Intervention(s): Monitored during session;Repositioned           PT Goals (current goals can now be found in the care plan section) Acute Rehab PT Goals Patient Stated Goal: Go outside and feel the sunshine PT Goal Formulation: With patient Time For Goal Achievement: 06/19/19 Potential to Achieve Goals: Good Progress towards PT goals: Progressing toward goals    Frequency    Min 4X/week      PT Plan Current plan remains appropriate       AM-PAC PT "6 Clicks" Mobility   Outcome Measure  Help needed turning from your back to your side while in a flat bed without using bedrails?: A Little Help needed moving from lying on your back to sitting on the side of a flat bed without using bedrails?: A Little Help needed moving to and from a bed to a chair (including a wheelchair)?: A Lot Help needed standing up from a chair using your arms (e.g., wheelchair or bedside chair)?: A Lot Help needed to walk in hospital room?: A Lot Help needed climbing 3-5 steps with a railing? : Total 6 Click Score: 13    End of Session Equipment Utilized During Treatment: Gait belt Activity Tolerance: Patient tolerated treatment well Patient left: with call bell/phone within reach;with family/visitor present;in chair;with chair alarm set Nurse Communication: Mobility status PT Visit Diagnosis: Unsteadiness on feet (R26.81);Other abnormalities of gait and mobility (R26.89);Hemiplegia and hemiparesis Hemiplegia - Right/Left: Left Hemiplegia - dominant/non-dominant: Non-dominant Hemiplegia - caused by: Cerebral infarction     Time: 1132-1201 PT Time Calculation (min) (ACUTE ONLY): 29 min  Charges:   $Gait Training: 23-37 mins                     Elvenia Godden B. Migdalia Dk PT, DPT Acute Rehabilitation Services Pager [phone] Office [phone]    [place] 06/06/2019, 5:32 PM

## 2019-06-06 NOTE — Progress Notes (Signed)
Modified Barium Swallow Progress Note  Patient Details  Name: Scott Day MRN: [id number] Date of Birth: 01-Sep-1960  Today's Date: 06/06/2019  Modified Barium Swallow completed.  Full report located under Chart Review in the Imaging Section.  Brief recommendations include the following:  Clinical Impression  Patient presents with mild oropharyngeal dysphagia. Oral phase is remarkable for prolonged mastication and lingual residue. Pharyngeal phase is remarkable for reduced epiglottic inversion that leads to reduced laryngeal closure, resulting in penetration (PAS 4 and 5) and vallecular residue. Pt noted with throat clearing during the study following penetration, and was able to clear penetrate the majority of the time. Prior to administering POs, pt was also noted with grunting and throat clearing. Given the prolonged transit with regular solids d/t missing dentention, recommend Dys 3 and thin liquids, and meds whole in puree. Ensure the pt is following general aspiration precautions (small bites/sips, upright while eating/drinking).    Swallow Evaluation Recommendations       SLP Diet Recommendations: Dysphagia 3 (Mech soft) solids;Thin liquid   Liquid Administration via: Straw;Cup;Spoon   Medication Administration: Whole meds with puree   Supervision: Intermittent supervision to cue for compensatory strategies   Compensations: Minimize environmental distractions;Slow rate;Small sips/bites   Postural Changes: Seated upright at 90 degrees   Oral Care Recommendations: Oral care BID   Other Recommendations: Have oral suction available  [name], Student SLP Office: [phone]  06/06/2019,3:02 PM

## 2019-06-06 NOTE — Progress Notes (Signed)
Referring Physician(s): Code Stroke- Scott Day  Supervising Physician: Scott Day  Patient Status:  [facility] - In-pt  Chief Complaint: None  Subjective:  History of acute CVA s/p cerebral arteriogram with emergent mechanical thrombectomy and stent placement of mid/distal right MCA M1 occlusion achieving a TICI 3 revascularization 06/04/2019 by Dr. Karenann Day. Patient awake and alert sitting in bed with no complaints at this time. Accompanied by daughter at bedside. Can spontaneously move all extremities. Speech dysarthric but improved. Left facial droop present. Right gaze preference but able to cross midline. Right groin incision c/d/i.   Allergies: Patient has no known allergies.  Medications: Prior to Admission medications   Medication Sig Start Date End Date Taking? Authorizing Provider  HYDROcodone-acetaminophen (NORCO) 5-325 MG tablet 1-2 tabs po q6 hours prn pain Patient not taking: Reported on 06/04/2019 09/20/17   Leanora Cover, MD     Vital Signs: BP (!) 148/85 (BP Location: Left Arm)   Pulse 86   Temp 98.5 F (36.9 C)   Resp 14   Ht 5' 11"  (1.803 m)   Wt 231 lb 7.7 oz (105 kg)   SpO2 93%   BMI 32.29 kg/m   Physical Exam Vitals and nursing note reviewed.  Constitutional:      General: He is not in acute distress. Pulmonary:     Effort: Pulmonary effort is normal. No respiratory distress.  Skin:    General: Skin is warm and dry.     Comments: Right groin incision soft without active bleeding or hematoma.  Neurological:     Mental Status: He is alert.     Comments: Alert, awake, and oriented x3. Speech dysarthric but improved. Follows simple commands. PERRL bilaterally. Demonstrates right gaze preference but able to cross midline. Left facial droop. Tongue midline. Can spontaneously move all extremities. No pronator drift. Distal pulses 2+ bilaterally.     Imaging: CT ANGIO HEAD W OR WO  CONTRAST  Addendum Date: 06/04/2019   ADDENDUM REPORT: 06/04/2019 12:21 ADDENDUM: Contrast dose is 100 mL Omnipaque 350 Electronically Signed   By: Macy Mis M.D.   On: 06/04/2019 12:21   Result Date: 06/04/2019 CLINICAL DATA:  Code stroke.  Slurred speech and left facial droop EXAM: CT ANGIOGRAPHY HEAD AND NECK CT PERFUSION BRAIN TECHNIQUE: Multidetector CT imaging of the head and neck was performed using the standard protocol during bolus administration of intravenous contrast. Multiplanar CT image reconstructions and MIPs were obtained to evaluate the vascular anatomy. Carotid stenosis measurements (when applicable) are obtained utilizing NASCET criteria, using the distal internal carotid diameter as the denominator. Multiphase CT imaging of the brain was performed following IV bolus contrast injection. Subsequent parametric perfusion maps were calculated using RAPID software. COMPARISON:  None. FINDINGS: CT HEAD Brain: There is no acute intracranial hemorrhage mass effect edema. Gray-white differentiation is preserved. Ventricles and sulci normal in size and configuration. There is no extra-axial fluid collection. Vascular: Hyperdense vessel. Skull: Unremarkable. Sinuses/Orbits: Lobular mucosal thickening with greatest involvement of the right maxillary sinus. No acute orbital finding. Other: Mastoid air cells are clear ASPECTS [facility] Stroke Program Early CT Score) - Ganglionic level infarction (caudate, lentiform nuclei, internal capsule, insula, M1-M3 cortex): 7 - Supraganglionic infarction (M4-M6 cortex): 3 Total score (0-10 with 10 being normal): 10 Review of the MIP images confirms the above findings CTA NECK Aortic arch: Great vessel origins are patent. Right carotid system: Patent. There is trace calcified plaque at the ICA origin measurable stenosis. Left carotid  system: Patent. Eccentric noncalcified plaque causing less than 50% stenosis. Mild calcified plaque at the ICA origin causing less  than 50% stenosis. Vertebral arteries: Patent.  Left vertebral artery dominant. Skeleton: Cervical spine degenerative changes. Other neck: No mass or adenopathy Upper chest: No apical lung mass. Review of the MIP images confirms the above findings CTA HEAD Anterior circulation: Intracranial internal carotid arteries patent. There is nonocclusive thrombus within distal right M1 MCA extending to the bifurcation. Left middle cerebral artery is patent. Anterior cerebral arteries are patent. Congenitally absent right A1 ACA. Posterior circulation: Intracranial vertebral arteries, basilar artery, and posterior cerebral arteries are patent. There are bilateral patent posterior communicating arteries. Venous sinuses: As permitted by contrast timing, patent. Review of the MIP images confirms the above findings CT Brain Perfusion: CBF (<30%) Volume: 39m Perfusion (Tmax>6.0s) volume: 1511mMismatch Volume: 12663mnfarction Location: Right MCA territory IMPRESSION: No acute intracranial hemorrhage or evidence of acute infarction. ASPECT score is 10. 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 Perfusion imaging demonstrates 25 mL core infarction and 126 mL territory at risk in the right MCA territory. No hemodynamically significant stenosis in the neck. These results were called by telephone at the time of interpretation on 06/04/2019 at 11:53 am to provider Scott [facility] LLCLESAlexis Goodellwho verbally acknowledged these results. Electronically Signed: By: PraMacy MisD. On: 06/04/2019 11:59   DG Abd 1 View  Result Date: 06/05/2019 CLINICAL DATA:  New NG tube EXAM: ABDOMEN - 1 VIEW COMPARISON:  None. FINDINGS: Advancement of NG tube such that the tip is at the expected location of the junction of the second third portion the duodenum. IMPRESSION: NG tube extends into duodenum Electronically Signed   By: SteSuzy BouchardD.   On: 06/05/2019 14:28   DG Abd 1 View  Result  Date: 06/04/2019 CLINICAL DATA:  Nasogastric tube placement. EXAM: ABDOMEN - 1 VIEW COMPARISON:  None. FINDINGS: A nasogastric tube is seen with its distal tip overlying the body of the stomach. This is approximately 7.8 cm distal to the expected region of the gastroesophageal junction. The bowel gas pattern is normal. No radio-opaque calculi or other significant radiographic abnormality are seen. IMPRESSION: Nasogastric tube positioning, as described above. Further advancement of the NG tube by approximately 5 cm is recommended to decrease the risk of aspiration. Electronically Signed   By: ThaVirgina NorfolkD.   On: 06/04/2019 20:23   CT HEAD WO CONTRAST  Result Date: 06/05/2019 CLINICAL DATA:  Follow-up stroke.  Right MCA territory infarctions. EXAM: CT HEAD WITHOUT CONTRAST TECHNIQUE: Contiguous axial images were obtained from the base of the skull through the vertex without intravenous contrast. COMPARISON:  Multiple CT studies over the last 2 days. FINDINGS: Brain: Patchy low-density in the right middle cerebral artery territory continues to become more evident by CT consistent with multiple infarctions in that region. These are notable in the insula, right lateral and posterior temporal lobe, right frontal operculum and right frontal parietal junction. No significant swelling, mass effect or any detectable hemorrhage. Right MCA stent as seen previously. Vascular: There is atherosclerotic calcification of the major vessels at the base of the brain. Right MCA stent as seen previously. Skull: Negative Sinuses/Orbits: Chronic sinusitis particularly affecting the right maxillary sinus. Orbits negative. Other: None IMPRESSION: Multiple infarctions in the right middle cerebral artery territory becoming more evident on CT, with increasing low density. No evidence of significant mass effect or any hemorrhage. Electronically Signed  By: Nelson Chimes M.D.   On: 06/05/2019 15:38   CT HEAD WO CONTRAST  Result  Date: 06/04/2019 CLINICAL DATA:  Stroke, follow-up. EXAM: CT HEAD WITHOUT CONTRAST TECHNIQUE: Contiguous axial images were obtained from the base of the skull through the vertex without intravenous contrast. COMPARISON:  Noncontrast head CT and CT angiogram head/neck performed earlier the same day 06/04/2019 FINDINGS: Brain: There has been interval development of patchy acute cortical/subcortical infarction changes within the right MCA vascular territory. Findings are most notable within the right frontal operculum, right insula and within portions of the right temporoparietal junction (series 3, image 20) (series 3, image 18) (series 5, image 48). No evidence of hemorrhagic conversion. No significant mass effect. No midline shift. There is no hydrocephalus. No extra-axial fluid collection is identified. Vascular: A stent is now present in the region of the M1 right middle cerebral artery. Otherwise, no hyperdense vessel is identified. Skull: Normal. Negative for fracture or focal lesion. Sinuses/Orbits: Visualized orbits demonstrate no acute abnormality. Moderate mucosal thickening and frothy secretions within the right maxillary sinus. Additional mild scattered paranasal sinus mucosal thickening. Left frontal sinus mucous retention cyst. No significant mastoid effusion. IMPRESSION: Interval development of patchy acute cortical/subcortical infarction changes within the right MCA vascular territory most notable within the right frontal operculum, right insula and portions of the right temporoparietal junction. No hemorrhagic conversion. No midline shift or significant mass effect. Paranasal sinus disease as described, most notably right maxillary. Electronically Signed   By: Kellie Simmering DO   On: 06/04/2019 18:43   CT ANGIO NECK W OR WO CONTRAST  Addendum Date: 06/04/2019   ADDENDUM REPORT: 06/04/2019 12:21 ADDENDUM: Contrast dose is 100 mL Omnipaque 350 Electronically Signed   By: Macy Mis M.D.   On:  06/04/2019 12:21   Result Date: 06/04/2019 CLINICAL DATA:  Code stroke.  Slurred speech and left facial droop EXAM: CT ANGIOGRAPHY HEAD AND NECK CT PERFUSION BRAIN TECHNIQUE: Multidetector CT imaging of the head and neck was performed using the standard protocol during bolus administration of intravenous contrast. Multiplanar CT image reconstructions and MIPs were obtained to evaluate the vascular anatomy. Carotid stenosis measurements (when applicable) are obtained utilizing NASCET criteria, using the distal internal carotid diameter as the denominator. Multiphase CT imaging of the brain was performed following IV bolus contrast injection. Subsequent parametric perfusion maps were calculated using RAPID software. COMPARISON:  None. FINDINGS: CT HEAD Brain: There is no acute intracranial hemorrhage mass effect edema. Gray-white differentiation is preserved. Ventricles and sulci normal in size and configuration. There is no extra-axial fluid collection. Vascular: Hyperdense vessel. Skull: Unremarkable. Sinuses/Orbits: Lobular mucosal thickening with greatest involvement of the right maxillary sinus. No acute orbital finding. Other: Mastoid air cells are clear ASPECTS [facility] Stroke Program Early CT Score) - Ganglionic level infarction (caudate, lentiform nuclei, internal capsule, insula, M1-M3 cortex): 7 - Supraganglionic infarction (M4-M6 cortex): 3 Total score (0-10 with 10 being normal): 10 Review of the MIP images confirms the above findings CTA NECK Aortic arch: Great vessel origins are patent. Right carotid system: Patent. There is trace calcified plaque at the ICA origin measurable stenosis. Left carotid system: Patent. Eccentric noncalcified plaque causing less than 50% stenosis. Mild calcified plaque at the ICA origin causing less than 50% stenosis. Vertebral arteries: Patent.  Left vertebral artery dominant. Skeleton: Cervical spine degenerative changes. Other neck: No mass or adenopathy Upper chest: No  apical lung mass. Review of the MIP images confirms the above findings CTA HEAD Anterior circulation: Intracranial  internal carotid arteries patent. There is nonocclusive thrombus within distal right M1 MCA extending to the bifurcation. Left middle cerebral artery is patent. Anterior cerebral arteries are patent. Congenitally absent right A1 ACA. Posterior circulation: Intracranial vertebral arteries, basilar artery, and posterior cerebral arteries are patent. There are bilateral patent posterior communicating arteries. Venous sinuses: As permitted by contrast timing, patent. Review of the MIP images confirms the above findings CT Brain Perfusion: CBF (<30%) Volume: 21m Perfusion (Tmax>6.0s) volume: 1513mMismatch Volume: 12675mnfarction Location: Right MCA territory IMPRESSION: No acute intracranial hemorrhage or evidence of acute infarction. ASPECT score is 10. 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 Perfusion imaging demonstrates 25 mL core infarction and 126 mL territory at risk in the right MCA territory. No hemodynamically significant stenosis in the neck. These results were called by telephone at the time of interpretation on 06/04/2019 at 11:53 am to provider [facility] CountyLESAlexis Goodellwho verbally acknowledged these results. Electronically Signed: By: PraMacy MisD. On: 06/04/2019 11:59   IR Intra Cran Stent  Result Date: 06/04/2019 CLINICAL DATA:  [age]ar old male with past medical history significant for colon cancer in 2012. He developed left-sided weakness and neglect on awakening this morning. [facility] showed small hypodensity in the right insula with no hemorrhage. NIHSS 10. CT angiogram showed tapering of the right M1/MCA with short-segment filling defect. CT perfusion showed an estimated core volume of 25 mL with a mismatch of 126 mL. He was transferred to our service for emergency treatment of  the right M1/MCA occlusion. EXAM: IR CT HEAD LIMITED; IR PERCUTANEOUS ART THORMBECTOMY/INFUSION INTRACRANIAL INCLUDE DIAG ANGIO; IR ULTRASOUND GUIDANCE VASC ACCESS RIGHT; INTRACRANIAL STENT (INCL PTA) ANESTHESIA/SEDATION: General anesthesia. MEDICATIONS: Intra arterial Verapamil 5 mg; intra arterial Integrilin 19 mg. CONTRAST:  5mL93mNIPAQUE IOHEXOL 300 MG/ML SOLN, 75mL73mIPAQUE IOHEXOL 240 MG/ML SOLN PROCEDURE: Operator: Dr. KatyuPedro Day No healthcare proxy or next of kin available for informed consent. Discussion held with the neurohospitalist attending. Agreement reached that the procedure would be in the best interest of the patient and benefits outweighed risks. The patient was made to lie supine on the angiography table. He was prepped and draped utilizing the usual sterile technique. Using gray scale ultrasound-guided, a micropuncture kit and modified Seldinger technique, access was gained to the right common femoral artery. An 8 French femoral sheath was then placed. Under fluoroscopy, an 8 Frenc[place]us balloon guide catheter was navigated over a 6 FrencPakistannstein 2 catheter and a 0.038 inch Terumo Glidewire into the aortic arch. Under fluoroscopy, the catheter was advanced into the right common carotid artery and then into the right internal carotid artery. Frontal and lateral angiograms of the head were obtained showing a distal right M1/MCA occlusion with minimal delayed penetration of contrast. A large bore aspiration catheter was then navigated through the walrus balloon guide catheter and over a phenom 21 microcatheter and a synchro support microguidewire into the cavernous segment of the right ICA. The microcatheter was then advanced over the microwire into a right M1/MCA middle division branch. A 6 mm solitaire stent retriever was deployed spanning the right M1/MCA and proximal M2/middle division branch. Device was allowed to intercalated with the clot for 4 minutes. The  microcatheter was removed under fluoroscopy. The guiding catheter balloon was inflated at the level of the proximal petrous segment. Thrombectomy device and aspiration catheter were then removed under constant aspiration. Follow-up angiogram  showed recanalization of the M1 segment with subocclusive filling defect and slow flow in distal MCA branches (TICI 2C). A large bore aspiration catheter was again navigated through the walrus balloon guide catheter and over a phenom 21 microcatheter and a synchro support microguidewire into the cavernous segment of the right ICA. The microcatheter was then advanced over the microwire into a right M1/MCA posterior division branch. A 6 mm solitaire stent retriever was deployed spanning the right M1/MCA and proximal M2 posterior division branch. Device was allowed to intercalated with the clot for 4 minutes. The microcatheter was removed under fluoroscopy. The guiding catheter balloon was inflated at the level of the proximal petrous segment. Thrombectomy device and aspiration catheter were then removed under constant aspiration. Follow-up angiogram show normal forward flow into the right MCA vascular territory noting a small dissection flap in the mid/distal right M1/MCA segment. Delayed follow-up angiogram showed progressive reocclusion of the right M1/MCA with minimal forward flow. Flat panel CT of the head was obtained and post processed in a separate workstation under concurrent attending physician supervision. No evidence of hemorrhagic complication noted. Right ICA angiograms with frontal lateral views of the head were obtained. Further progression of occlusion in the right M1 segment was noted. Small non flow limiting iatrogenic dissection in the upper cervical segment of the right ICA identified. The large bore aspiration catheter was navigated through the walrus balloon guide catheter and over a SL 10 microcatheter and a synchro support microguidewire into the cavernous  segment of the right ICA. The microcatheter was then advanced over the microwire into a right M1/MCA posterior division branch. Intra arterial infusion of 19 mg of Integrilin was performed into the right ICA. Subsequently, a 3 mm x 15 mm atlas intracranial stent was deployed in the M1 segment across the dissection flap. Follow-up angiogram showed adequate stent positioning with complete restoration of the anterograde flow in the right MCA. Delayed follow-up angiogram (x2) show no evidence of reocclusion. 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 Postprocedural flat panel CT of the head was obtained and post processed in a separate workstation under concurrent attending physician supervision. No evidence of hemorrhagic complication noted. The catheter system was withdrawn. A right common femoral artery angiogram was obtained showing access at the level of the right common femoral artery. The femoral sheath was removed and the access was closed with a Perclose ProGlide. Immediate hemostasis achieved. 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 FINDINGS: Dissection of the mid/distal right M1/MCA. IMPRESSION: 1. Mechanical thrombectomy performed (x2) with temporary restoration of flow revealing dissection flap in the mid/distal right M1/MCA segment. 2. Intracranial stenting placed in the right M1/MCA across the dissection flap with complete restoration of flow. 3. No hemorrhagic complication on postprocedural flat panel CT. No embolus to new territory. PLAN: Patient is to remain on Integrilin drip. A follow-up head CT will be obtained at 6:30 p.m. today. At this point, patient will be re-evaluated for transition to oral dual anti-platelet therapy. Electronically Signed   By: Scott Day M.D.   On: 06/04/2019 17:40   IR CT Head Ltd  Result Date: 06/04/2019 CLINICAL DATA:  59 year old male  with past medical history significant for colon cancer in 2012. He developed left-sided weakness and neglect on awakening this morning. He was taken to [facility] hospital. Head CT showed small hypodensity in the right insula with no hemorrhage. NIHSS 10. CT  angiogram showed tapering of the right M1/MCA with short-segment filling defect. CT perfusion showed an estimated core volume of 25 mL with a mismatch of 126 mL. He was transferred to our service for emergency treatment of the right M1/MCA occlusion. EXAM: IR CT HEAD LIMITED; IR PERCUTANEOUS ART THORMBECTOMY/INFUSION INTRACRANIAL INCLUDE DIAG ANGIO; IR ULTRASOUND GUIDANCE VASC ACCESS RIGHT; INTRACRANIAL STENT (INCL PTA) ANESTHESIA/SEDATION: General anesthesia. MEDICATIONS: Intra arterial Verapamil 5 mg; intra arterial Integrilin 19 mg. CONTRAST:  73m OMNIPAQUE IOHEXOL 300 MG/ML SOLN, 769mOMNIPAQUE IOHEXOL 240 MG/ML SOLN PROCEDURE: Operator: Dr. KaPedro EarlsMD. No healthcare proxy or next of kin available for informed consent. Discussion held with the neurohospitalist attending. Agreement reached that the procedure would be in the best interest of the patient and benefits outweighed risks. The patient was made to lie supine on the angiography table. He was prepped and draped utilizing the usual sterile technique. Using gray scale ultrasound-guided, a micropuncture kit and modified Seldinger technique, access was gained to the right common femoral artery. An 8 French femoral sheath was then placed. Under fluoroscopy, an 8 FrPak[place]alrus balloon guide catheter was navigated over a 6 FrPakistanerenstein 2 catheter and a 0.038 inch Terumo Glidewire into the aortic arch. Under fluoroscopy, the catheter was advanced into the right common carotid artery and then into the right internal carotid artery. Frontal and lateral angiograms of the head were obtained showing a distal right M1/MCA occlusion with minimal delayed penetration of contrast. A  large bore aspiration catheter was then navigated through the walrus balloon guide catheter and over a phenom 21 microcatheter and a synchro support microguidewire into the cavernous segment of the right ICA. The microcatheter was then advanced over the microwire into a right M1/MCA middle division branch. A 6 mm solitaire stent retriever was deployed spanning the right M1/MCA and proximal M2/middle division branch. Device was allowed to intercalated with the clot for 4 minutes. The microcatheter was removed under fluoroscopy. The guiding catheter balloon was inflated at the level of the proximal petrous segment. Thrombectomy device and aspiration catheter were then removed under constant aspiration. Follow-up angiogram showed recanalization of the M1 segment with subocclusive filling defect and slow flow in distal MCA branches (TICI 2C). A large bore aspiration catheter was again navigated through the walrus balloon guide catheter and over a phenom 21 microcatheter and a synchro support microguidewire into the cavernous segment of the right ICA. The microcatheter was then advanced over the microwire into a right M1/MCA posterior division branch. A 6 mm solitaire stent retriever was deployed spanning the right M1/MCA and proximal M2 posterior division branch. Device was allowed to intercalated with the clot for 4 minutes. The microcatheter was removed under fluoroscopy. The guiding catheter balloon was inflated at the level of the proximal petrous segment. Thrombectomy device and aspiration catheter were then removed under constant aspiration. Follow-up angiogram show normal forward flow into the right MCA vascular territory noting a small dissection flap in the mid/distal right M1/MCA segment. Delayed follow-up angiogram showed progressive reocclusion of the right M1/MCA with minimal forward flow. Flat panel CT of the head was obtained and post processed in a separate workstation under concurrent attending  physician supervision. No evidence of hemorrhagic complication noted. Right ICA angiograms with frontal lateral views of the head were obtained. Further progression of occlusion in the right M1 segment was noted. Small non flow limiting iatrogenic dissection in the upper cervical segment of the right ICA identified. The large bore aspiration catheter was navigated  through the walrus balloon guide catheter and over a SL 10 microcatheter and a synchro support microguidewire into the cavernous segment of the right ICA. The microcatheter was then advanced over the microwire into a right M1/MCA posterior division branch. Intra arterial infusion of 19 mg of Integrilin was performed into the right ICA. Subsequently, a 3 mm x 15 mm atlas intracranial stent was deployed in the M1 segment across the dissection flap. Follow-up angiogram showed adequate stent positioning with complete restoration of the anterograde flow in the right MCA. Delayed follow-up angiogram (x2) show no evidence of reocclusion. 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 Postprocedural flat panel CT of the head was obtained and post processed in a separate workstation under concurrent attending physician supervision. No evidence of hemorrhagic complication noted. The catheter system was withdrawn. A right common femoral artery angiogram was obtained showing access at the level of the right common femoral artery. The femoral sheath was removed and the access was closed with a Perclose ProGlide. Immediate hemostasis achieved. 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 FINDINGS: Dissection of the mid/distal right M1/MCA. IMPRESSION: 1. Mechanical thrombectomy performed (x2) with temporary restoration of flow revealing dissection flap in the mid/distal right M1/MCA segment. 2. Intracranial stenting placed in the right M1/MCA across the dissection  flap with complete restoration of flow. 3. No hemorrhagic complication on postprocedural flat panel CT. No embolus to new territory. PLAN: Patient is to remain on Integrilin drip. A follow-up head CT will be obtained at 6:30 p.m. today. At this point, patient will be re-evaluated for transition to oral dual anti-platelet therapy. Electronically Signed   By: Scott Day M.D.   On: 06/04/2019 17:40   IR CT Head Ltd  Result Date: 06/04/2019 CLINICAL DATA:  59 year old male with past medical history significant for colon cancer in 2012. He developed left-sided weakness and neglect on awakening this morning. He was taken to [facility] hospital. Head CT showed small hypodensity in the right insula with no hemorrhage. NIHSS 10. CT angiogram showed tapering of the right M1/MCA with short-segment filling defect. CT perfusion showed an estimated core volume of 25 mL with a mismatch of 126 mL. He was transferred to our service for emergency treatment of the right M1/MCA occlusion. EXAM: IR CT HEAD LIMITED; IR PERCUTANEOUS ART THORMBECTOMY/INFUSION INTRACRANIAL INCLUDE DIAG ANGIO; IR ULTRASOUND GUIDANCE VASC ACCESS RIGHT; INTRACRANIAL STENT (INCL PTA) ANESTHESIA/SEDATION: General anesthesia. MEDICATIONS: Intra arterial Verapamil 5 mg; intra arterial Integrilin 19 mg. CONTRAST:  15m OMNIPAQUE IOHEXOL 300 MG/ML SOLN, 755mOMNIPAQUE IOHEXOL 240 MG/ML SOLN PROCEDURE: Operator: Dr. KaPedro EarlsMD. No healthcare proxy or next of kin available for informed consent. Discussion held with the neurohospitalist attending. Agreement reached that the procedure would be in the best interest of the patient and benefits outweighed risks. The patient was made to lie supine on the angiography table. He was prepped and draped utilizing the usual sterile technique. Using gray scale ultrasound-guided, a micropuncture kit and modified Seldinger technique, access was gained to the right common femoral  artery. An 8 French femoral sheath was then placed. Under fluoroscopy, an 8 FrPak[place]rus balloon guide catheter was navigated over a 6 FrPakistanerenstein 2 catheter and a 0.038 inch Terumo Glidewire into the aortic arch. Under fluoroscopy, the catheter was advanced into the right common carotid artery and then into the right internal carotid artery. Frontal and lateral angiograms  of the head were obtained showing a distal right M1/MCA occlusion with minimal delayed penetration of contrast. A large bore aspiration catheter was then navigated through the walrus balloon guide catheter and over a phenom 21 microcatheter and a synchro support microguidewire into the cavernous segment of the right ICA. The microcatheter was then advanced over the microwire into a right M1/MCA middle division branch. A 6 mm solitaire stent retriever was deployed spanning the right M1/MCA and proximal M2/middle division branch. Device was allowed to intercalated with the clot for 4 minutes. The microcatheter was removed under fluoroscopy. The guiding catheter balloon was inflated at the level of the proximal petrous segment. Thrombectomy device and aspiration catheter were then removed under constant aspiration. Follow-up angiogram showed recanalization of the M1 segment with subocclusive filling defect and slow flow in distal MCA branches (TICI 2C). A large bore aspiration catheter was again navigated through the walrus balloon guide catheter and over a phenom 21 microcatheter and a synchro support microguidewire into the cavernous segment of the right ICA. The microcatheter was then advanced over the microwire into a right M1/MCA posterior division branch. A 6 mm solitaire stent retriever was deployed spanning the right M1/MCA and proximal M2 posterior division branch. Device was allowed to intercalated with the clot for 4 minutes. The microcatheter was removed under fluoroscopy. The guiding catheter balloon was inflated at the level of  the proximal petrous segment. Thrombectomy device and aspiration catheter were then removed under constant aspiration. Follow-up angiogram show normal forward flow into the right MCA vascular territory noting a small dissection flap in the mid/distal right M1/MCA segment. Delayed follow-up angiogram showed progressive reocclusion of the right M1/MCA with minimal forward flow. Flat panel CT of the head was obtained and post processed in a separate workstation under concurrent attending physician supervision. No evidence of hemorrhagic complication noted. Right ICA angiograms with frontal lateral views of the head were obtained. Further progression of occlusion in the right M1 segment was noted. Small non flow limiting iatrogenic dissection in the upper cervical segment of the right ICA identified. The large bore aspiration catheter was navigated through the walrus balloon guide catheter and over a SL 10 microcatheter and a synchro support microguidewire into the cavernous segment of the right ICA. The microcatheter was then advanced over the microwire into a right M1/MCA posterior division branch. Intra arterial infusion of 19 mg of Integrilin was performed into the right ICA. Subsequently, a 3 mm x 15 mm atlas intracranial stent was deployed in the M1 segment across the dissection flap. Follow-up angiogram showed adequate stent positioning with complete restoration of the anterograde flow in the right MCA. Delayed follow-up angiogram (x2) show no evidence of reocclusion. 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 Postprocedural flat panel CT of the head was obtained and post processed in a separate workstation under concurrent attending physician supervision. No evidence of hemorrhagic complication noted. The catheter system was withdrawn. A right common femoral artery angiogram was obtained showing access at the level of the right common femoral artery. The  femoral sheath was removed and the access was closed with a Perclose ProGlide. Immediate hemostasis achieved. 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 FINDINGS: Dissection of the mid/distal right M1/MCA. IMPRESSION: 1. Mechanical thrombectomy performed (x2) with temporary restoration of flow revealing dissection flap in the mid/distal right M1/MCA segment. 2. Intracranial stenting placed in the right M1/MCA across the dissection  flap with complete restoration of flow. 3. No hemorrhagic complication on postprocedural flat panel CT. No embolus to new territory. PLAN: Patient is to remain on Integrilin drip. A follow-up head CT will be obtained at 6:30 p.m. today. At this point, patient will be re-evaluated for transition to oral dual anti-platelet therapy. Electronically Signed   By: Scott Day M.D.   On: 06/04/2019 17:40   IR US Guide Vasc Access Right  Result Date: 06/04/2019 CLINICAL DATA:  59 year old male with past medical history significant for colon cancer in 2012. He developed left-sided weakness and neglect on awakening this morning. He was taken to [facility] hospital. Head CT showed small hypodensity in the right insula with no hemorrhage. NIHSS 10. CT angiogram showed tapering of the right M1/MCA with short-segment filling defect. CT perfusion showed an estimated core volume of 25 mL with a mismatch of 126 mL. He was transferred to our service for emergency treatment of the right M1/MCA occlusion. EXAM: IR CT HEAD LIMITED; IR PERCUTANEOUS ART THORMBECTOMY/INFUSION INTRACRANIAL INCLUDE DIAG ANGIO; IR ULTRASOUND GUIDANCE VASC ACCESS RIGHT; INTRACRANIAL STENT (INCL PTA) ANESTHESIA/SEDATION: General anesthesia. MEDICATIONS: Intra arterial Verapamil 5 mg; intra arterial Integrilin 19 mg. CONTRAST:  6m OMNIPAQUE IOHEXOL 300 MG/ML SOLN, 798mOMNIPAQUE IOHEXOL 240 MG/ML SOLN PROCEDURE: Operator: Dr. KaPedro EarlsMD. No healthcare proxy or next of kin available for informed consent. Discussion held with the neurohospitalist attending. Agreement reached that the procedure would be in the best interest of the patient and benefits outweighed risks. The patient was made to lie supine on the angiography table. He was prepped and draped utilizing the usual sterile technique. Using gray scale ultrasound-guided, a micropuncture kit and modified Seldinger technique, access was gained to the right common femoral artery. An 8 French femoral sheath was then placed. Under fluoroscopy, an 8 Fr[place]rus balloon guide catheter was navigated over a 6 FrPakistanerenstein 2 catheter and a 0.038 inch Terumo Glidewire into the aortic arch. Under fluoroscopy, the catheter was advanced into the right common carotid artery and then into the right internal carotid artery. Frontal and lateral angiograms of the head were obtained showing a distal right M1/MCA occlusion with minimal delayed penetration of contrast. A large bore aspiration catheter was then navigated through the walrus balloon guide catheter and over a phenom 21 microcatheter and a synchro support microguidewire into the cavernous segment of the right ICA. The microcatheter was then advanced over the microwire into a right M1/MCA middle division branch. A 6 mm solitaire stent retriever was deployed spanning the right M1/MCA and proximal M2/middle division branch. Device was allowed to intercalated with the clot for 4 minutes. The microcatheter was removed under fluoroscopy. The guiding catheter balloon was inflated at the level of the proximal petrous segment. Thrombectomy device and aspiration catheter were then removed under constant aspiration. Follow-up angiogram showed recanalization of the M1 segment with subocclusive filling defect and slow flow in distal MCA branches (TICI 2C). A large bore aspiration catheter was again navigated through the walrus balloon guide  catheter and over a phenom 21 microcatheter and a synchro support microguidewire into the cavernous segment of the right ICA. The microcatheter was then advanced over the microwire into a right M1/MCA posterior division branch. A 6 mm solitaire stent retriever was deployed spanning the right M1/MCA and proximal M2 posterior division branch. Device was allowed to intercalated with the clot for 4 minutes. The microcatheter was removed under fluoroscopy. The guiding catheter balloon was inflated  at the level of the proximal petrous segment. Thrombectomy device and aspiration catheter were then removed under constant aspiration. Follow-up angiogram show normal forward flow into the right MCA vascular territory noting a small dissection flap in the mid/distal right M1/MCA segment. Delayed follow-up angiogram showed progressive reocclusion of the right M1/MCA with minimal forward flow. Flat panel CT of the head was obtained and post processed in a separate workstation under concurrent attending physician supervision. No evidence of hemorrhagic complication noted. Right ICA angiograms with frontal lateral views of the head were obtained. Further progression of occlusion in the right M1 segment was noted. Small non flow limiting iatrogenic dissection in the upper cervical segment of the right ICA identified. The large bore aspiration catheter was navigated through the walrus balloon guide catheter and over a SL 10 microcatheter and a synchro support microguidewire into the cavernous segment of the right ICA. The microcatheter was then advanced over the microwire into a right M1/MCA posterior division branch. Intra arterial infusion of 19 mg of Integrilin was performed into the right ICA. Subsequently, a 3 mm x 15 mm atlas intracranial stent was deployed in the M1 segment across the dissection flap. Follow-up angiogram showed adequate stent positioning with complete restoration of the anterograde flow in the right MCA.  Delayed follow-up angiogram (x2) show no evidence of reocclusion. 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 Postprocedural flat panel CT of the head was obtained and post processed in a separate workstation under concurrent attending physician supervision. No evidence of hemorrhagic complication noted. The catheter system was withdrawn. A right common femoral artery angiogram was obtained showing access at the level of the right common femoral artery. The femoral sheath was removed and the access was closed with a Perclose ProGlide. Immediate hemostasis achieved. 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 FINDINGS: Dissection of the mid/distal right M1/MCA. IMPRESSION: 1. Mechanical thrombectomy performed (x2) with temporary restoration of flow revealing dissection flap in the mid/distal right M1/MCA segment. 2. Intracranial stenting placed in the right M1/MCA across the dissection flap with complete restoration of flow. 3. No hemorrhagic complication on postprocedural flat panel CT. No embolus to new territory. PLAN: Patient is to remain on Integrilin drip. A follow-up head CT will be obtained at 6:30 p.m. today. At this point, patient will be re-evaluated for transition to oral dual anti-platelet therapy. Electronically Signed   By: Scott Day M.D.   On: 06/04/2019 17:40   CT CEREBRAL PERFUSION W CONTRAST  Addendum Date: 06/04/2019   ADDENDUM REPORT: 06/04/2019 12:21 ADDENDUM: Contrast dose is 100 mL Omnipaque 350 Electronically Signed   By: Macy Mis M.D.   On: 06/04/2019 12:21   Result Date: 06/04/2019 CLINICAL DATA:  Code stroke.  Slurred speech and left facial droop EXAM: CT ANGIOGRAPHY HEAD AND NECK CT PERFUSION BRAIN TECHNIQUE: Multidetector CT imaging of the head and neck was performed using the standard protocol during bolus administration of intravenous contrast.  Multiplanar CT image reconstructions and MIPs were obtained to evaluate the vascular anatomy. Carotid stenosis measurements (when applicable) are obtained utilizing NASCET criteria, using the distal internal carotid diameter as the denominator. Multiphase CT imaging of the brain was performed following IV bolus contrast injection. Subsequent parametric perfusion maps were calculated using RAPID software. COMPARISON:  None. FINDINGS: CT HEAD Brain: There is no acute intracranial hemorrhage mass effect edema. Gray-white differentiation is preserved. Ventricles and sulci normal in  size and configuration. There is no extra-axial fluid collection. Vascular: Hyperdense vessel. Skull: Unremarkable. Sinuses/Orbits: Lobular mucosal thickening with greatest involvement of the right maxillary sinus. No acute orbital finding. Other: Mastoid air cells are clear ASPECTS [facility] Stroke Program Early CT Score) - Ganglionic level infarction (caudate, lentiform nuclei, internal capsule, insula, M1-M3 cortex): 7 - Supraganglionic infarction (M4-M6 cortex): 3 Total score (0-10 with 10 being normal): 10 Review of the MIP images confirms the above findings CTA NECK Aortic arch: Great vessel origins are patent. Right carotid system: Patent. There is trace calcified plaque at the ICA origin measurable stenosis. Left carotid system: Patent. Eccentric noncalcified plaque causing less than 50% stenosis. Mild calcified plaque at the ICA origin causing less than 50% stenosis. Vertebral arteries: Patent.  Left vertebral artery dominant. Skeleton: Cervical spine degenerative changes. Other neck: No mass or adenopathy Upper chest: No apical lung mass. Review of the MIP images confirms the above findings CTA HEAD Anterior circulation: Intracranial internal carotid arteries patent. There is nonocclusive thrombus within distal right M1 MCA extending to the bifurcation. Left middle cerebral artery is patent. Anterior cerebral arteries are patent.  Congenitally absent right A1 ACA. Posterior circulation: Intracranial vertebral arteries, basilar artery, and posterior cerebral arteries are patent. There are bilateral patent posterior communicating arteries. Venous sinuses: As permitted by contrast timing, patent. Review of the MIP images confirms the above findings CT Brain Perfusion: CBF (<30%) Volume: 11m Perfusion (Tmax>6.0s) volume: 158mMismatch Volume: 12658mnfarction Location: Right MCA territory IMPRESSION: No acute intracranial hemorrhage or evidence of acute infarction. ASPECT score is 10. 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 Perfusion imaging demonstrates 25 mL core infarction and 126 mL territory at risk in the right MCA territory. No hemodynamically significant stenosis in the neck. These results were called by telephone at the time of interpretation on 06/04/2019 at 11:53 am to provider JONPeacehealth United [facility] HospitalLESAlexis Goodellwho verbally acknowledged these results. Electronically Signed: By: PraMacy MisD. On: 06/04/2019 11:59   DG CHEST PORT 1 VIEW  Result Date: 06/05/2019 CLINICAL DATA:  NG tube placement. EXAM: PORTABLE CHEST 1 VIEW COMPARISON:  CT angiogram of the chest dated 09/13/2002 FINDINGS: NG tube tip is below the diaphragm. Heart size and pulmonary vascularity are normal. Lungs are clear. No significant bone abnormality. IMPRESSION: Normal chest.  NG tube tip below the diaphragm. Electronically Signed   By: JamLorriane ShireD.   On: 06/05/2019 14:29   DG Abd Portable 1V  Result Date: 06/05/2019 CLINICAL DATA:  Nasogastric tube placement. EXAM: PORTABLE ABDOMEN - 1 VIEW COMPARISON:  Same day. FINDINGS: The bowel gas pattern is normal. Distal tip of nasogastric tube is seen in expected position of the gastroesophageal junction. No radio-opaque calculi or other significant radiographic abnormality are seen. IMPRESSION: Distal tip of nasogastric tube seen in expected position of  gastroesophageal junction. Proximal side hole is seen in distal esophagus. Advancement is recommended. Electronically Signed   By: JamMarijo ConceptionD.   On: 06/05/2019 12:01   DG Abd Portable 1V  Result Date: 06/05/2019 CLINICAL DATA:  Nasogastric tube placement EXAM: PORTABLE ABDOMEN - 1 VIEW COMPARISON:  Portable exam 1050 hours compared to 0858 hours FINDINGS: Tip of nasogastric tube projects over stomach though the proximal side-port projects over the distal esophagus; recommend advancing tube 5 cm. Nonobstructive bowel gas pattern. No bowel dilatation or bowel wall thickening. Osseous structures unremarkable. IMPRESSION: Proximal side-port of nasogastric tube projects over distal esophagus; recommend advancing  tube 5 cm. Electronically Signed   By: Lavonia Dana M.D.   On: 06/05/2019 11:00   DG Abd Portable 1V  Result Date: 06/05/2019 CLINICAL DATA:  NG tube placement EXAM: PORTABLE ABDOMEN - 1 VIEW COMPARISON:  06/04/2019 FINDINGS: Esophagogastric tube remains with tip below the diaphragm and side port above the gastroesophageal junction. General paucity of bowel although scattered gas is gas present to the rectum. No free air on supine radiographs. IMPRESSION: Esophagogastric tube remains with tip below the diaphragm and side port above the gastroesophageal junction. Recommend advancement to ensure subdiaphragmatic positioning of tip and side port. Electronically Signed   By: Eddie Candle M.D.   On: 06/05/2019 09:25   ECHOCARDIOGRAM COMPLETE  Result Date: 06/05/2019    ECHOCARDIOGRAM REPORT   Patient Name:   Scott Day Date of Exam: 06/05/2019 Medical Rec #:  [id number]      Height:       71.0 in Accession #:    [id number]     Weight:       231.5 lb Date of Birth:  [birth year]      BSA:          2.244 m Patient Age:    59 years       BP:           126/67 mmHg Patient Gender: M              HR:           70 bpm. Exam Location:  Inpatient Procedure: 2D Echo, Cardiac Doppler and Color Doppler  Indications:    Stroke 434.91/I163.9  History:        Patient has no prior history of Echocardiogram examinations.                 Risk Factors:Former Smoker. CVA.  Sonographer:    Clayton Lefort RDCS (AE) Referring Phys: [phone] MCNEILL P [place]  1. Left ventricular ejection fraction, by estimation, is 65 to 70%. The left ventricle has normal function. The left ventricle has no regional wall motion abnormalities. There is severe left ventricular hypertrophy. Left ventricular diastolic parameters  were normal.  2. Right ventricular systolic function is normal. The right ventricular size is normal.  3. The mitral valve is abnormal. Trivial mitral valve regurgitation.  4. The aortic valve is tricuspid. Aortic valve regurgitation is not visualized. No aortic stenosis is present.  5. The inferior vena cava is normal in size with <50% respiratory variability, suggesting right atrial pressure of 8 mmHg. FINDINGS  Left Ventricle: Left ventricular ejection fraction, by estimation, is 65 to 70%. The left ventricle has normal function. The left ventricle has no regional wall motion abnormalities. The left ventricular internal cavity size was normal in size. There is  severe left ventricular hypertrophy. Left ventricular diastolic parameters were normal. Right Ventricle: The right ventricular size is normal. No increase in right ventricular wall thickness. Right ventricular systolic function is normal. Left Atrium: Left atrial size was normal in size. Right Atrium: Right atrial size was normal in size. Pericardium: There is no evidence of pericardial effusion. Mitral Valve: The mitral valve is abnormal. There is mild thickening of the mitral valve leaflet(s). Trivial mitral valve regurgitation. MV peak gradient, 3.6 mmHg. The mean mitral valve gradient is 1.0 mmHg. Tricuspid Valve: The tricuspid valve is grossly normal. Tricuspid valve regurgitation is trivial. Aortic Valve: The aortic valve is tricuspid. Aortic  valve regurgitation is not visualized. No aortic stenosis is present. Pulmonic  Valve: The pulmonic valve was grossly normal. Pulmonic valve regurgitation is not visualized. Aorta: The aortic root, ascending aorta, aortic arch and descending aorta are all structurally normal, with no evidence of dilitation or obstruction. Venous: The inferior vena cava is normal in size with less than 50% respiratory variability, suggesting right atrial pressure of 8 mmHg. IAS/Shunts: The interatrial septum was not well visualized.  LEFT VENTRICLE PLAX 2D LVIDd:         4.02 cm  Diastology LVIDs:         2.40 cm  LV e' lateral:   10.40 cm/s LV PW:         1.68 cm  LV E/e' lateral: 9.0 LV IVS:        1.71 cm  LV e' medial:    9.79 cm/s LVOT diam:     2.10 cm  LV E/e' medial:  9.6 LV SV:         80 LV SV Index:   36 LVOT Area:     3.46 cm  RIGHT VENTRICLE             IVC RV Basal diam:  2.29 cm     IVC diam: 1.86 cm RV S prime:     14.10 cm/s TAPSE (M-mode): 2.7 cm LEFT ATRIUM             Index       RIGHT ATRIUM           Index LA diam:        2.70 cm 1.20 cm/m  RA Area:     11.90 cm LA Vol (A2C):   52.4 ml 23.35 ml/m RA Volume:   23.60 ml  10.52 ml/m LA Vol (A4C):   40.7 ml 18.14 ml/m LA Biplane Vol: 47.6 ml 21.21 ml/m  AORTIC VALVE AV Area (Vmean):   3.20 cm AV Area (VTI):     3.24 cm AV Vmean:          88.200 cm/s AV VTI:            0.246 m LVOT Vmax:         118.00 cm/s LVOT Vmean:        81.500 cm/s LVOT VTI:          0.230 m LVOT/AV VTI ratio: 0.93  AORTA Ao Root diam: 3.40 cm Ao Asc diam:  3.60 cm MITRAL VALVE MV Area (PHT): 3.27 cm    SHUNTS MV Peak grad:  3.6 mmHg    Systemic VTI:  0.23 m MV Mean grad:  1.0 mmHg    Systemic Diam: 2.10 cm MV Vmax:       0.94 m/s MV Vmean:      56.1 cm/s MV Decel Time: 232 msec MV E velocity: 94.10 cm/s MV A velocity: 80.00 cm/s MV E/A ratio:  1.18 Lyman Bishop MD Electronically signed by Lyman Bishop MD Signature Date/Time: 06/05/2019/11:23:20 AM    Final    IR PERCUTANEOUS ART  THROMBECTOMY/INFUSION INTRACRANIAL INC DIAG ANGIO  Result Date: 06/04/2019 CLINICAL DATA:  59 year old male with past medical history significant for colon cancer in 2012. He developed left-sided weakness and neglect on awakening this morning. He was taken to [facility] hospital. Head CT showed small hypodensity in the right insula with no hemorrhage. NIHSS 10. CT angiogram showed tapering of the right M1/MCA with short-segment filling defect. CT perfusion showed an estimated core volume of 25 mL with a mismatch of 126 mL. He was transferred to our  service for emergency treatment of the right M1/MCA occlusion. EXAM: IR CT HEAD LIMITED; IR PERCUTANEOUS ART THORMBECTOMY/INFUSION INTRACRANIAL INCLUDE DIAG ANGIO; IR ULTRASOUND GUIDANCE VASC ACCESS RIGHT; INTRACRANIAL STENT (INCL PTA) ANESTHESIA/SEDATION: General anesthesia. MEDICATIONS: Intra arterial Verapamil 5 mg; intra arterial Integrilin 19 mg. CONTRAST:  34m OMNIPAQUE IOHEXOL 300 MG/ML SOLN, 745mOMNIPAQUE IOHEXOL 240 MG/ML SOLN PROCEDURE: Operator: Dr. KaPedro EarlsMD. No healthcare proxy or next of kin available for informed consent. Discussion held with the neurohospitalist attending. Agreement reached that the procedure would be in the best interest of the patient and benefits outweighed risks. The patient was made to lie supine on the angiography table. He was prepped and draped utilizing the usual sterile technique. Using gray scale ultrasound-guided, a micropuncture kit and modified Seldinger technique, access was gained to the right common femoral artery. An 8 French femoral sheath was then placed. Under fluoroscopy, an 8 FrPakistanalrus balloon guide catheter was navigated over a 6 FrPakistanerenstein 2 catheter and a 0.038 inch Terumo Glidewire into the aortic arch. Under fluoroscopy, the catheter was advanced into the right common carotid artery and then into the right internal carotid artery. Frontal and lateral angiograms of the  head were obtained showing a distal right M1/MCA occlusion with minimal delayed penetration of contrast. A large bore aspiration catheter was then navigated through the walrus balloon guide catheter and over a phenom 21 microcatheter and a synchro support microguidewire into the cavernous segment of the right ICA. The microcatheter was then advanced over the microwire into a right M1/MCA middle division branch. A 6 mm solitaire stent retriever was deployed spanning the right M1/MCA and proximal M2/middle division branch. Device was allowed to intercalated with the clot for 4 minutes. The microcatheter was removed under fluoroscopy. The guiding catheter balloon was inflated at the level of the proximal petrous segment. Thrombectomy device and aspiration catheter were then removed under constant aspiration. Follow-up angiogram showed recanalization of the M1 segment with subocclusive filling defect and slow flow in distal MCA branches (TICI 2C). A large bore aspiration catheter was again navigated through the walrus balloon guide catheter and over a phenom 21 microcatheter and a synchro support microguidewire into the cavernous segment of the right ICA. The microcatheter was then advanced over the microwire into a right M1/MCA posterior division branch. A 6 mm solitaire stent retriever was deployed spanning the right M1/MCA and proximal M2 posterior division branch. Device was allowed to intercalated with the clot for 4 minutes. The microcatheter was removed under fluoroscopy. The guiding catheter balloon was inflated at the level of the proximal petrous segment. Thrombectomy device and aspiration catheter were then removed under constant aspiration. Follow-up angiogram show normal forward flow into the right MCA vascular territory noting a small dissection flap in the mid/distal right M1/MCA segment. Delayed follow-up angiogram showed progressive reocclusion of the right M1/MCA with minimal forward flow. Flat panel  CT of the head was obtained and post processed in a separate workstation under concurrent attending physician supervision. No evidence of hemorrhagic complication noted. Right ICA angiograms with frontal lateral views of the head were obtained. Further progression of occlusion in the right M1 segment was noted. Small non flow limiting iatrogenic dissection in the upper cervical segment of the right ICA identified. The large bore aspiration catheter was navigated through the walrus balloon guide catheter and over a SL 10 microcatheter and a synchro support microguidewire into the cavernous segment of the right ICA. The microcatheter was then advanced over the  microwire into a right M1/MCA posterior division branch. Intra arterial infusion of 19 mg of Integrilin was performed into the right ICA. Subsequently, a 3 mm x 15 mm atlas intracranial stent was deployed in the M1 segment across the dissection flap. Follow-up angiogram showed adequate stent positioning with complete restoration of the anterograde flow in the right MCA. Delayed follow-up angiogram (x2) show no evidence of reocclusion. 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 Postprocedural flat panel CT of the head was obtained and post processed in a separate workstation under concurrent attending physician supervision. No evidence of hemorrhagic complication noted. The catheter system was withdrawn. A right common femoral artery angiogram was obtained showing access at the level of the right common femoral artery. The femoral sheath was removed and the access was closed with a Perclose ProGlide. Immediate hemostasis achieved. 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 FINDINGS: Dissection of the mid/distal right M1/MCA. IMPRESSION: 1. Mechanical thrombectomy performed (x2) with temporary restoration of flow revealing dissection flap in the  mid/distal right M1/MCA segment. 2. Intracranial stenting placed in the right M1/MCA across the dissection flap with complete restoration of flow. 3. No hemorrhagic complication on postprocedural flat panel CT. No embolus to new territory. PLAN: Patient is to remain on Integrilin drip. A follow-up head CT will be obtained at 6:30 p.m. today. At this point, patient will be re-evaluated for transition to oral dual anti-platelet therapy. Electronically Signed   By: Scott Day M.D.   On: 06/04/2019 17:40   CT HEAD CODE STROKE WO CONTRAST  Addendum Date: 06/04/2019   ADDENDUM REPORT: 06/04/2019 12:21 ADDENDUM: Contrast dose is 100 mL Omnipaque 350 Electronically Signed   By: Macy Mis M.D.   On: 06/04/2019 12:21   Result Date: 06/04/2019 CLINICAL DATA:  Code stroke.  Slurred speech and left facial droop EXAM: CT ANGIOGRAPHY HEAD AND NECK CT PERFUSION BRAIN TECHNIQUE: Multidetector CT imaging of the head and neck was performed using the standard protocol during bolus administration of intravenous contrast. Multiplanar CT image reconstructions and MIPs were obtained to evaluate the vascular anatomy. Carotid stenosis measurements (when applicable) are obtained utilizing NASCET criteria, using the distal internal carotid diameter as the denominator. Multiphase CT imaging of the brain was performed following IV bolus contrast injection. Subsequent parametric perfusion maps were calculated using RAPID software. COMPARISON:  None. FINDINGS: CT HEAD Brain: There is no acute intracranial hemorrhage mass effect edema. Gray-white differentiation is preserved. Ventricles and sulci normal in size and configuration. There is no extra-axial fluid collection. Vascular: Hyperdense vessel. Skull: Unremarkable. Sinuses/Orbits: Lobular mucosal thickening with greatest involvement of the right maxillary sinus. No acute orbital finding. Other: Mastoid air cells are clear ASPECTS [facility] Stroke Program Early CT  Score) - Ganglionic level infarction (caudate, lentiform nuclei, internal capsule, insula, M1-M3 cortex): 7 - Supraganglionic infarction (M4-M6 cortex): 3 Total score (0-10 with 10 being normal): 10 Review of the MIP images confirms the above findings CTA NECK Aortic arch: Great vessel origins are patent. Right carotid system: Patent. There is trace calcified plaque at the ICA origin measurable stenosis. Left carotid system: Patent. Eccentric noncalcified plaque causing less than 50% stenosis. Mild calcified plaque at the ICA origin causing less than 50% stenosis. Vertebral arteries: Patent.  Left vertebral artery dominant. Skeleton: Cervical spine degenerative changes. Other neck: No mass or adenopathy Upper chest: No apical lung mass. Review of the MIP images confirms the  above findings CTA HEAD Anterior circulation: Intracranial internal carotid arteries patent. There is nonocclusive thrombus within distal right M1 MCA extending to the bifurcation. Left middle cerebral artery is patent. Anterior cerebral arteries are patent. Congenitally absent right A1 ACA. Posterior circulation: Intracranial vertebral arteries, basilar artery, and posterior cerebral arteries are patent. There are bilateral patent posterior communicating arteries. Venous sinuses: As permitted by contrast timing, patent. Review of the MIP images confirms the above findings CT Brain Perfusion: CBF (<30%) Volume: 31m Perfusion (Tmax>6.0s) volume: 1517mMismatch Volume: 12657mnfarction Location: Right MCA territory IMPRESSION: No acute intracranial hemorrhage or evidence of acute infarction. ASPECT score is 10. 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 Perfusion imaging demonstrates 25 mL core infarction and 126 mL territory at risk in the right MCA territory. No hemodynamically significant stenosis in the neck. These results were called by telephone at the time of interpretation on 06/04/2019 at 11:53 am to  provider JONAltru Rehabilitation CenterLESAlexis Goodellwho verbally acknowledged these results. Electronically Signed: By: PraMacy MisD. On: 06/04/2019 11:59    Labs:  CBC: Recent Labs    06/04/19 1125 06/05/19 0543 06/06/19 0522  WBC 8.4 8.0 7.6  HGB 15.2 14.6 14.8  HCT 44.3 42.8 44.4  PLT 216 212 200    COAGS: Recent Labs    06/04/19 1125  INR 1.1  APTT 31    BMP: Recent Labs    06/04/19 1125 06/05/19 0543 06/06/19 0522  NA 133* 140 139  K 4.0 3.8 3.3*  CL 98 108 107  CO2 26 24 21*  GLUCOSE 213* 144* 121*  BUN 7 5* 6  CALCIUM 8.8* 8.6* 8.7*  CREATININE 0.91 0.89 0.86  GFRNONAA >60 >60 >60  GFRAA >60 >60 >60    LIVER FUNCTION TESTS: Recent Labs    06/04/19 1125  BILITOT 0.9  AST 19  ALT 24  ALKPHOS 74  PROT 6.9  ALBUMIN 3.9    Assessment and Plan:  History of acute CVA s/p cerebral arteriogram with emergent mechanical thrombectomy and stent placement of mid/distal right MCA M1 occlusion achieving a TICI 3 revascularization 06/04/2019 by Dr. de Scott Caiatient's condition improving- moving all extremities, speech dysarthric but improved, right gaze preference but able to cross midline, left facial droop. Right groin incision stable, distal pulses 2+ bilaterally. Continue taking Brilinta 90 mg twice daily and Aspirin 81 mg once daily. Plan to follow-up with Dr. de Scott Day clinic in 3 months, and for repeat image-guided diagnostic cerebral arteriogram in 6 months with Dr. de Scott Cairder placed to facilitate follow-up. Further plans per neurology- appreciate and agree with management. NIR to follow.   Electronically Signed: AleEarley AbideA-C 06/06/2019, 10:01 AM   I spent a total of 25 Minutes at the the patient's bedside AND on the patient's hospital floor or unit, greater than 50% of which was counseling/coordinating care for right MCA M1 occlusion s/p revascularization.

## 2019-06-06 NOTE — Progress Notes (Signed)
VASCULAR LAB PRELIMINARY  PRELIMINARY  PRELIMINARY  PRELIMINARY  Bilateral lower extremity venous duplex completed.    Preliminary report:  See CV proc for preliminary results.   Amyre Segundo, RVT 06/06/2019, 10:26 AM

## 2019-06-06 NOTE — Progress Notes (Signed)
  Speech Language Pathology Treatment: Dysphagia  Patient Details Name: Scott Day MRN: [id number] DOB: 01/05/1961 Today's Date: 06/06/2019 Time: [id number] SLP Time Calculation (min) (ACUTE ONLY): 31 min  Assessment / Plan / Recommendation Clinical Impression  Pt's swallow and speech-language-cognitive ability was assessed yesterday. This morning's session focused on pt's swallow function and ability to initiate solids/thin liquid with daughter at bedside. He was eager for po's, impulsive with decreased left attention and required moderate cueing to focus on task and SLP. At baseline he frequently cleared his throat/grunting. Concerns for aspiration including immediate cough following thin and continued throat clears, large bore NGT also in place. Cough is strong but possible incomplete secretion clearance. Given clinical presentation and acute stroke instrumental evaluation recommended. MBS scheduled today at 1400- have requested large bore NGT be removed prior to study.   Provided education re: cognition specifically pt's impulsivity and decreased inhibitions common with pt's having right brain strokes. Daughter asking relevant questions and appeared to have some degree of awareness re: her father's deficits. Encouraged her to stand on his left side to promote awareness to that side of environment. He is a good candidate for inpatient rehab.    HPI HPI: Scott Day is a 59 y.o. male with a history of colon cancer in 2012 and GERD, who presents with left-sided weakness. 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      SLP Plan  MBS       Recommendations  Diet recommendations: NPO Medication Administration: Via alternative means                General recommendations: Rehab consult Oral Care Recommendations: Oral care BID Follow up Recommendations: Inpatient Rehab SLP Visit Diagnosis: Dysphagia, unspecified (R13.10) Plan:  MBS       GO                Houston Siren 06/06/2019, 11:29 AM

## 2019-06-06 NOTE — Progress Notes (Signed)
STROKE TEAM PROGRESS NOTE   INTERVAL HISTORY Daughter and son at bedside. Pt still has right facial droop but right sided weakness much improved and speech clearer than yesterday. Received ASA and brilinta this am, and off NG tube. Pending MBS at 2pm. If not passing swallow, will do cortrak. As per daughter, pt was able to walk with PT/OT in the hallway.    Vitals:   06/06/19 0900 06/06/19 1000 06/06/19 1100 06/06/19 1200  BP:  (!) 166/99 (!) 151/92 (!) 147/73  Pulse: 86  92 84  Resp: 14 17 17  (!) 27  Temp:      TempSrc:      SpO2: 93% 94% 97% 98%  Weight:      Height:        CBC:  Recent Labs  Lab 06/04/19 1125 06/04/19 1125 06/05/19 0543 06/06/19 0522  WBC 8.4   < > 8.0 7.6  NEUTROABS 6.7  --   --   --   HGB 15.2   < > 14.6 14.8  HCT 44.3   < > 42.8 44.4  MCV 92.3   < > 93.9 94.9  PLT 216   < > 212 200   < > = values in this interval not displayed.    Basic Metabolic Panel:  Recent Labs  Lab 06/05/19 0543 06/06/19 0522  NA 140 139  K 3.8 3.3*  CL 108 107  CO2 24 21*  GLUCOSE 144* 121*  BUN 5* 6  CREATININE 0.89 0.86  CALCIUM 8.6* 8.7*   Lipid Panel:     Component Value Date/Time   CHOL 180 06/05/2019 0543   TRIG 131 06/05/2019 0543   HDL 28 (L) 06/05/2019 0543   CHOLHDL 6.4 06/05/2019 0543   VLDL 26 06/05/2019 0543   LDLCALC 126 (H) 06/05/2019 0543   HgbA1c:  Lab Results  Component Value Date   HGBA1C 8.3 (H) 06/05/2019   Urine Drug Screen:     Component Value Date/Time   LABOPIA NONE DETECTED 06/05/2019 1926   COCAINSCRNUR NONE DETECTED 06/05/2019 1926   LABBENZ NONE DETECTED 06/05/2019 1926   AMPHETMU NONE DETECTED 06/05/2019 1926   THCU NONE DETECTED 06/05/2019 1926   LABBARB NONE DETECTED 06/05/2019 1926    Alcohol Level     Component Value Date/Time   ETH <10 06/04/2019 1157    IMAGING past 24 hours DG Abd 1 View  Result Date: 06/05/2019 CLINICAL DATA:  New NG tube EXAM: ABDOMEN - 1 VIEW COMPARISON:  None. FINDINGS: Advancement  of NG tube such that the tip is at the expected location of the junction of the second third portion the duodenum. IMPRESSION: NG tube extends into duodenum Electronically Signed   By: Suzy Bouchard M.D.   On: 06/05/2019 14:28   CT HEAD WO CONTRAST  Result Date: 06/05/2019 CLINICAL DATA:  Follow-up stroke.  Right MCA territory infarctions. EXAM: CT HEAD WITHOUT CONTRAST TECHNIQUE: Contiguous axial images were obtained from the base of the skull through the vertex without intravenous contrast. COMPARISON:  Multiple CT studies over the last 2 days. FINDINGS: Brain: Patchy low-density in the right middle cerebral artery territory continues to become more evident by CT consistent with multiple infarctions in that region. These are notable in the insula, right lateral and posterior temporal lobe, right frontal operculum and right frontal parietal junction. No significant swelling, mass effect or any detectable hemorrhage. Right MCA stent as seen previously. Vascular: There is atherosclerotic calcification of the major vessels at the base of the  brain. Right MCA stent as seen previously. Skull: Negative Sinuses/Orbits: Chronic sinusitis particularly affecting the right maxillary sinus. Orbits negative. Other: None IMPRESSION: Multiple infarctions in the right middle cerebral artery territory becoming more evident on CT, with increasing low density. No evidence of significant mass effect or any hemorrhage. Electronically Signed   By: Nelson Chimes M.D.   On: 06/05/2019 15:38   DG CHEST PORT 1 VIEW  Result Date: 06/05/2019 CLINICAL DATA:  NG tube placement. EXAM: PORTABLE CHEST 1 VIEW COMPARISON:  CT angiogram of the chest dated 09/13/2002 FINDINGS: NG tube tip is below the diaphragm. Heart size and pulmonary vascularity are normal. Lungs are clear. No significant bone abnormality. IMPRESSION: Normal chest.  NG tube tip below the diaphragm. Electronically Signed   By: Lorriane Shire M.D.   On: 06/05/2019 14:29    VAS [place] LOWER EXTREMITY VENOUS (DVT)  Result Date: 06/06/2019  Lower Venous DVTStudy Indications: Stroke.  Comparison Study: No prior study on file Performing Technologist: Scott Day RVS  Examination Guidelines: A complete evaluation includes B-mode imaging, spectral Doppler, color Doppler, and power Doppler as needed of all accessible portions of each vessel. Bilateral testing is considered an integral part of a complete examination. Limited examinations for reoccurring indications may be performed as noted. The reflux portion of the exam is performed with the patient in reverse Trendelenburg.  +---------+---------------+---------+-----------+----------+--------------+ RIGHT    CompressibilityPhasicitySpontaneityPropertiesThrombus Aging +---------+---------------+---------+-----------+----------+--------------+ CFV      Full           Yes      Yes                                 +---------+---------------+---------+-----------+----------+--------------+ SFJ      Full                                                        +---------+---------------+---------+-----------+----------+--------------+ FV Prox  Full                                                        +---------+---------------+---------+-----------+----------+--------------+ FV Mid   Full                                                        +---------+---------------+---------+-----------+----------+--------------+ FV DistalFull                                                        +---------+---------------+---------+-----------+----------+--------------+ PFV      Full                                                        +---------+---------------+---------+-----------+----------+--------------+  POP      Full           Yes      Yes                                 +---------+---------------+---------+-----------+----------+--------------+ PTV      Full                                                         +---------+---------------+---------+-----------+----------+--------------+ PERO     Full                                                        +---------+---------------+---------+-----------+----------+--------------+   +---------+---------------+---------+-----------+----------+--------------+ LEFT     CompressibilityPhasicitySpontaneityPropertiesThrombus Aging +---------+---------------+---------+-----------+----------+--------------+ CFV      Full           Yes      Yes                                 +---------+---------------+---------+-----------+----------+--------------+ SFJ      Full                                                        +---------+---------------+---------+-----------+----------+--------------+ FV Prox  Full                                                        +---------+---------------+---------+-----------+----------+--------------+ FV Mid   Full                                                        +---------+---------------+---------+-----------+----------+--------------+ FV DistalFull                                                        +---------+---------------+---------+-----------+----------+--------------+ PFV      Full                                                        +---------+---------------+---------+-----------+----------+--------------+ POP      Full           Yes      Yes                                 +---------+---------------+---------+-----------+----------+--------------+  PTV      Full                                                        +---------+---------------+---------+-----------+----------+--------------+ PERO     Full                                                        +---------+---------------+---------+-----------+----------+--------------+     Summary: BILATERAL: - No evidence of deep vein thrombosis seen in the lower extremities,  bilaterally.   *See table(s) above for measurements and observations.    Preliminary     PHYSICAL EXAM  Temp:  [98.4 F (36.9 C)-98.9 F (37.2 C)] 98.5 F (36.9 C) (03/17 0800) Pulse Rate:  [65-92] 84 (03/17 1200) Resp:  [14-27] 27 (03/17 1200) BP: (131-166)/(59-99) 147/73 (03/17 1200) SpO2:  [92 %-99 %] 98 % (03/17 1200) FiO2 (%):  [21 %] 21 % (03/17 1200)  General - Well nourished, well developed, in no apparent distress.  Ophthalmologic - fundi not visualized due to noncooperation.  Cardiovascular - Regular rhythm and rate.  Neuro - awake alert, mild dysarthria, orientated to place, people, year but not to month. No aphasia, fluent language, following all simple commands. PERRL, mild right gaze preference, able to gaze to left but not complete. Still has mild visual neglect and simutagnosia. Left facial droop. Tongue midline. RUE proximal 4/5 due to right mild shoulder pain but distal 5/5, LUE proximal and distal 5/5. BLE 5/5. Sensation symmetrical, coordination intact b/l FTN. Gait not tested.    ASSESSMENT/PLAN Scott Day is a 59 y.o. male with history of colon cancer in 2012 presenting to [facility] with L sided weakness. Out of window for tPA. CTA w/ R M1 occlusion w/ penumbra. Transferred to [facility] for IR.   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 ASPECTS 10.     CTA head & neck nonocclusive distal R M1 thrombus into bifurcation. Neck ok  CT perfusion 39ml core, 185ml territory at risk  IR mid/distal R M1 occlusion. Mechanical thrombectomy w/ TICI2c revascularization. Dissection flap at site of occlusion. Cerebral angio w/ reocclusion. R M1 stent placed w/ TICI3 flow. Treated w/ Integrilin gtt.   Post IR CT no hemorrhage. pathcy cortical/subcortical R MCA infarct (frontal operculum,  insula, temporoparietal jxn). Sinus dz.   Not able to have MRI due to left ankle device  CT head repeat 3/16 no ICH  LE Doppler no DVT  2D Echo EF 65-70%. No source of embolus   Will consider TEE and loop for cardioembolic work up   Hypercoagulable labs pending   UDS neg  LDL 126  HgbA1c 8.3  SCDs for VTE prophylaxis  No antithrombotic prior to admission, now on aspirin 325 mg daily and Brilinta (ticagrelor) 90 mg bid following Brilinta load.   Therapy recommendations:  CIR  Disposition:  pending   Blood Pressure  Home meds:  None, no hx HTN  Stable now . BP goal < 180/105  for now . Long-term BP goal normotensive  Hyperlipidemia  Home meds:  No statin  LDL 126, goal < 70  Added lipitor 80  Continue statin at discharge  Diabetes, new diagnosis  2 random glucoses elevated  Home meds:  None  HgbA1c 8.3, goal < 7.0  CBG  SSI  DM coordinator consult  Need PCP follow up  Dysphagia . Secondary to stroke . NPO, did not pass swallow . NG placed for meds -> now off . Speech on board, pending MBS . On IVF . Will do cortrak if not able to pass swallow    Other Stroke Risk Factors  Former Cigarette smoker  ETOH use, alcohol level <10, advised to drink no more than 2 drink(s) a day  Obesity, Body mass index is 32.29 kg/m., recommend weight loss, diet and exercise as appropriate   Other Active Problems  Hx colon cancer 2004 s/p surgical resection and chemo, followed up until 2012, no recurrence as per daughter  Hyponatremia 133->140  Hospital day # 2  This patient is critically ill due to right MCA stroke, uncontrolled diabetes, dysphagia, status post intracranial stent and at significant risk of neurological worsening, death form recurrent stroke, hemorrhagic conversion, seizure, DKA, aspiration pneumonia. This patient's care requires constant monitoring of vital signs, hemodynamics, respiratory and cardiac monitoring, review of multiple  databases, neurological assessment, discussion with family, other specialists and medical decision making of high complexity. I spent 40 minutes of neurocritical care time in the care of this patient. I had long discussion with daughter, son and patient at bedside, updated pt current condition, treatment plan and potential prognosis, and answered all the questions.  They expressed understanding and appreciation.   Rosalin Hawking, MD PhD Stroke Neurology 06/06/2019 12:49 PM   To contact Stroke Continuity provider, please refer to [email or web address] After hours, contact General Neurology

## 2019-06-07 DIAGNOSIS — G8194 Hemiplegia, unspecified affecting left nondominant side: Secondary | ICD-10-CM

## 2019-06-07 DIAGNOSIS — R1312 Dysphagia, oropharyngeal phase: Secondary | ICD-10-CM

## 2019-06-07 DIAGNOSIS — Z6832 Body mass index (BMI) 32.0-32.9, adult: Secondary | ICD-10-CM

## 2019-06-07 DIAGNOSIS — E6609 Other obesity due to excess calories: Secondary | ICD-10-CM

## 2019-06-07 DIAGNOSIS — I10 Essential (primary) hypertension: Secondary | ICD-10-CM

## 2019-06-07 DIAGNOSIS — I63311 Cerebral infarction due to thrombosis of right middle cerebral artery: Secondary | ICD-10-CM

## 2019-06-07 LAB — BETA-2-GLYCOPROTEIN I ABS, IGG/M/A
Beta-2 Glyco I IgG: <9 GPI IgG units (ref 0–20)
Beta-2-Glycoprotein I IgA: <9 GPI IgA units (ref 0–25)
Beta-2-Glycoprotein I IgM: <9 GPI IgM units (ref 0–32)

## 2019-06-07 LAB — BASIC METABOLIC PANEL
Anion gap: 9 (ref 5–15)
BUN: 6 mg/dL (ref 6–20)
CO2: 23 mmol/L (ref 22–32)
Calcium: 8.7 mg/dL — ABNORMAL LOW (ref 8.9–10.3)
Chloride: 109 mmol/L (ref 98–111)
Creatinine, Ser: 0.85 mg/dL (ref 0.61–1.24)
GFR calc Af Amer: >60 mL/min (ref >60)
GFR calc non Af Amer: >60 mL/min (ref >60)
Glucose, Bld: 115 mg/dL — ABNORMAL HIGH (ref 70–99)
Potassium: 3.5 mmol/L (ref 3.5–5.1)
Sodium: 141 mmol/L (ref 135–145)

## 2019-06-07 LAB — CARDIOLIPIN ANTIBODIES, IGG, IGM, IGA
Anticardiolipin IgA: <9 APL U/mL (ref 0–11)
Anticardiolipin IgG: <9 GPL U/mL (ref 0–14)
Anticardiolipin IgM: <9 MPL U/mL (ref 0–12)

## 2019-06-07 LAB — GLUCOSE, CAPILLARY
Glucose-Capillary: 110 mg/dL — ABNORMAL HIGH (ref 70–99)
Glucose-Capillary: 117 mg/dL — ABNORMAL HIGH (ref 70–99)
Glucose-Capillary: 121 mg/dL — ABNORMAL HIGH (ref 70–99)
Glucose-Capillary: 139 mg/dL — ABNORMAL HIGH (ref 70–99)
Glucose-Capillary: 180 mg/dL — ABNORMAL HIGH (ref 70–99)
Glucose-Capillary: 185 mg/dL — ABNORMAL HIGH (ref 70–99)

## 2019-06-07 LAB — CBC
HCT: 42.9 % (ref 39.0–52.0)
Hemoglobin: 14.6 g/dL (ref 13.0–17.0)
MCH: 31.6 pg (ref 26.0–34.0)
MCHC: 34 g/dL (ref 30.0–36.0)
MCV: 92.9 fL (ref 80.0–100.0)
Platelets: 211 10*3/uL (ref 150–400)
RBC: 4.62 MIL/uL (ref 4.22–5.81)
RDW: 12.2 % (ref 11.5–15.5)
WBC: 6.8 10*3/uL (ref 4.0–10.5)
nRBC: 0 % (ref 0.0–0.2)

## 2019-06-07 LAB — FANA STAINING PATTERNS: Speckled Pattern: 1:80 {titer}

## 2019-06-07 LAB — ANTINUCLEAR ANTIBODIES, IFA: ANA Ab, IFA: POSITIVE — AB

## 2019-06-07 LAB — HOMOCYSTEINE: Homocysteine: 18 umol/L — ABNORMAL HIGH (ref 0.0–14.5)

## 2019-06-07 MED ORDER — ATORVASTATIN CALCIUM 80 MG PO TABS
80.0000 mg | ORAL_TABLET | Freq: Every day | ORAL | Status: DC
  Administered 2019-06-07 – 2019-06-08 (×2): 80 mg via ORAL
  Filled 2019-06-07 (×2): qty 1

## 2019-06-07 MED ORDER — TICAGRELOR 90 MG PO TABS
90.0000 mg | ORAL_TABLET | Freq: Two times a day (BID) | ORAL | Status: DC
  Administered 2019-06-07 – 2019-06-09 (×5): 90 mg via ORAL
  Filled 2019-06-07 (×5): qty 1

## 2019-06-07 MED ORDER — ASPIRIN 81 MG PO CHEW
81.0000 mg | CHEWABLE_TABLET | Freq: Every day | ORAL | Status: DC
  Administered 2019-06-07 – 2019-06-09 (×3): 81 mg via ORAL
  Filled 2019-06-07 (×3): qty 1

## 2019-06-07 MED ORDER — ENSURE ENLIVE PO LIQD
237.0000 mL | Freq: Two times a day (BID) | ORAL | Status: DC
  Administered 2019-06-07 – 2019-06-08 (×3): 237 mL via ORAL

## 2019-06-07 NOTE — Progress Notes (Signed)
Inpatient Diabetes Program Recommendations  AACE/ADA: New Consensus Statement on Inpatient Glycemic Control   Target Ranges:  Prepandial:   less than 140 mg/dL      Peak postprandial:   less than 180 mg/dL (1-2 hours)      Critically ill patients:  140 - 180 mg/dL   Results for MECCA, PUFF (MRN [id number]) as of 06/07/2019 08:18  Ref. Range 06/06/2019 07:57 06/06/2019 11:42 06/06/2019 15:08 06/06/2019 22:16 06/06/2019 23:17 06/07/2019 03:50 06/07/2019 08:02  Glucose-Capillary Latest Ref Range: 70 - 99 mg/dL 104 (H) 134 (H) 129 (H) 134 (H) 123 (H) 117 (H) 139 (H)  Results for JEM, [place] (MRN [id number]) as of 06/07/2019 08:18  Ref. Range 06/05/2019 05:43  Hemoglobin A1C Latest Ref Range: 4.8 - 5.6 % 8.3 (H)   Review of Glycemic Control  Diabetes history: No Outpatient Diabetes medications: NA Current orders for Inpatient glycemic control: Novolog 0-15 units Q4H  Inpatient Diabetes Program Recommendations:   Oral DM medication: May want to consider ordering Metformin 500 mg BID.   HbgA1C:  A1C 8.3% on 06/05/19 indicating an average glucose of 192 mg/dl over the past 2-3 months.  NOTE: Noted consult for new DM dx. Will plan to talk with patient today.  Thanks, Barnie Alderman, RN, MSN, CDE Diabetes Coordinator Inpatient Diabetes Program [phone] (Team Pager from 8am to 5pm)

## 2019-06-07 NOTE — Progress Notes (Signed)
Inpatient Rehab Admissions Coordinator:   Met with pt at the bedside.  Note plans for TEE/loop still pending.  I have no bed available for this patient today.  Will f/u tomorrow for possible admission tomorrow/Saturday/Sunday pending availability.   Shann Medal, PT, DPT Admissions Coordinator [phone] 06/07/19  4:52 PM

## 2019-06-07 NOTE — Plan of Care (Signed)
  Problem: Education: Goal: Knowledge of disease or condition will improve Outcome: Progressing Goal: Knowledge of secondary prevention will improve Outcome: Progressing Goal: Knowledge of patient specific risk factors addressed and post discharge goals established will improve Outcome: Progressing Goal: Individualized Educational Video(s) Outcome: Progressing   Problem: Coping: Goal: Will verbalize positive feelings about self Outcome: Progressing Goal: Will identify appropriate support needs Outcome: Progressing   Problem: Health Behavior/Discharge Planning: Goal: Ability to manage health-related needs will improve Outcome: Progressing   Problem: Self-Care: Goal: Ability to participate in self-care as condition permits will improve Outcome: Progressing Goal: Verbalization of feelings and concerns over difficulty with self-care will improve Outcome: Progressing Goal: Ability to communicate needs accurately will improve Description: Pt alert and oriented x 4, able to communicate needs. Pt dysarthric. Outcome: Progressing   Problem: Nutrition: Goal: Risk of aspiration will decrease Outcome: Progressing Goal: Dietary intake will improve Outcome: Progressing   Problem: Ischemic Stroke/TIA Tissue Perfusion: Goal: Complications of ischemic stroke/TIA will be minimized Outcome: Progressing   Problem: Education: Goal: Knowledge of General Education information will improve Description: Including pain rating scale, medication(s)/side effects and non-pharmacologic comfort measures Outcome: Progressing   Problem: Health Behavior/Discharge Planning: Goal: Ability to manage health-related needs will improve Outcome: Progressing   Problem: Clinical Measurements: Goal: Ability to maintain clinical measurements within normal limits will improve Outcome: Progressing Goal: Will remain free from infection Outcome: Progressing Goal: Diagnostic test results will improve Outcome:  Progressing Goal: Respiratory complications will improve Outcome: Progressing Goal: Cardiovascular complication will be avoided Outcome: Progressing   Problem: Activity: Goal: Risk for activity intolerance will decrease Outcome: Progressing   Problem: Nutrition: Goal: Adequate nutrition will be maintained Outcome: Progressing   Problem: Coping: Goal: Level of anxiety will decrease Outcome: Progressing   Problem: Elimination: Goal: Will not experience complications related to bowel motility Outcome: Progressing Goal: Will not experience complications related to urinary retention Outcome: Progressing   Problem: Pain Managment: Goal: General experience of comfort will improve Outcome: Progressing   Problem: Safety: Goal: Ability to remain free from injury will improve Outcome: Progressing   Problem: Skin Integrity: Goal: Risk for impaired skin integrity will decrease Outcome: Progressing

## 2019-06-07 NOTE — Discharge Instructions (Signed)
Hemoglobin A1c Test Why am I having this test? You may have the hemoglobin A1c test (HbA1c test) done to:  Evaluate your risk for developing diabetes (diabetes mellitus).  Diagnose diabetes.  Monitor long-term control of blood sugar (glucose) in people who have diabetes and help make treatment decisions. This test may be done with other blood glucose tests, such as fasting blood glucose and oral glucose tolerance tests. What is being tested? Hemoglobin is a type of protein in the blood that carries oxygen. Glucose attaches to hemoglobin to form glycated hemoglobin. This test checks the amount of glycated hemoglobin in your blood, which is a good indicator of the average amount of glucose in your blood during the past 2-3 months. What kind of sample is taken?  A blood sample is required for this test. It is usually collected by inserting a needle into a blood vessel. Tell a health care provider about:  All medicines you are taking, including vitamins, herbs, eye drops, creams, and over-the-counter medicines.  Any blood disorders you have.  Any surgeries you have had.  Any medical conditions you have.  Whether you are pregnant or may be pregnant. How are the results reported? Your results will be reported as a percentage that indicates how much of your hemoglobin has glucose attached to it (is glycated). Your health care provider will compare your results to normal ranges that were established after testing a large group of people (reference ranges). Reference ranges may vary among labs and hospitals. For this test, common reference ranges are:  Adult or child without diabetes: 4-5.6%.  Adult or child with diabetes and good blood glucose control: less than 7%. What do the results mean? If you have diabetes:  A result of less than 7% is considered normal, meaning that your blood glucose is well controlled.  A result higher than 7% means that your blood glucose is not well  controlled, and your treatment plan may need to be adjusted. If you do not have diabetes:  A result within the reference range is considered normal, meaning that you are not at high risk for diabetes.  A result of 5.7-6.4% means that you have a high risk of developing diabetes, and you may have prediabetes. Prediabetes is the condition of having a blood glucose level that is higher than it should be, but not high enough for you to be diagnosed with diabetes. Having prediabetes puts you at risk for developing type 2 diabetes (type 2 diabetes mellitus). You may have more tests, including a repeat HbA1c test.  Results of 6.5% or higher on two separate HbA1c tests mean that you have diabetes. You may have more tests to confirm the diagnosis. Abnormally low HbA1c values may be caused by:  Pregnancy.  Severe blood loss.  Receiving donated blood (transfusions).  Low red blood cell count (anemia).  Long-term kidney failure.  Some unusual forms (variants) of hemoglobin. Talk with your health care provider about what your results mean. Questions to ask your health care provider Ask your health care provider, or the department that is doing the test:  When will my results be ready?  How will I get my results?  What are my treatment options?  What other tests do I need?  What are my next steps? Summary  The hemoglobin A1c test (HbA1c test) may be done to evaluate your risk for developing diabetes, to diagnose diabetes, and to monitor long-term control of blood sugar (glucose) in people who have  diabetes and help make treatment decisions.  Hemoglobin is a type of protein in the blood that carries oxygen. Glucose attaches to hemoglobin to form glycated hemoglobin. This test checks the amount of glycated hemoglobin in your blood, which is a good indicator of the average amount of glucose in your blood during the past 2-3 months.  Talk with your health care provider about what your results  mean. This information is not intended to replace advice given to you by your health care provider. Make sure you discuss any questions you have with your health care provider. Document Revised: 02/18/2017 Document Reviewed: 10/19/2016 Elsevier Patient Education  2020 [place]. Preventing Diabetes Mellitus Complications You can take action to prevent or slow down problems that are caused by diabetes (diabetes mellitus). Following your diabetes plan and taking care of yourself can reduce your risk of serious or life-threatening complications. What actions can I take to prevent diabetes complications? Manage your diabetes   Follow instructions from your health care providers about managing your diabetes. Your diabetes may be managed by a team of health care providers who can teach you how to care for yourself and can answer questions that you have.  Educate yourself about your condition so you can make healthy choices about eating and physical activity.  Check your blood sugar (glucose) levels as often as directed. Your health care provider will help you decide how often to check your blood glucose level depending on your treatment goals and how well you are meeting them.  Ask your health care provider if you should take low-dose aspirin daily and what dose is recommended for you. Taking low-dose aspirin daily is recommended to help prevent cardiovascular disease. Do not use nicotine or tobacco Do not use any products that contain nicotine or tobacco, such as cigarettes and e-cigarettes. If you need help quitting, ask your health care provider. Nicotine raises your risk for diabetes problems. If you quit using nicotine:  You will lower your risk for heart attack, stroke, nerve disease, and kidney disease.  Your cholesterol and blood pressure may improve.  Your blood circulation will improve. Keep your blood pressure under control Your personal target blood pressure is determined based  on:  Your age.  Your medicines.  How long you have had diabetes.  Any other medical conditions you have. To control your blood pressure:  Follow instructions from your health care provider about meal planning, exercise, and medicines.  Make sure your health care provider checks your blood pressure at every medical visit.  Monitor your blood pressure at home as told by your health care provider.  Keep your cholesterol under control To control your cholesterol:  Follow instructions from your health care provider about meal planning, exercise, and medicines.  Have your cholesterol checked at least once a year.  You may be prescribed medicine to lower cholesterol (statin). If you are not taking a statin, ask your health care provider if you should be. Controlling your cholesterol may:  Help prevent heart disease and stroke. These are the most common health problems for people with diabetes.  Improve your blood flow. Schedule and keep yearly physical exams and eye exams Your health care provider will tell you how often you need medical visits depending on your diabetes management plan. Keep all follow-up visits as directed. This is important so possible problems can be identified early and complications can be avoided or treated.  Every visit with your health care provider should include measuring your: ? Weight. ?  Blood pressure. ? Blood glucose control.  Your A1c (hemoglobin A1c) level should be checked: ? At least 2 times a year, if you are meeting your treatment goals. ? 4 times a year, if you are not meeting treatment goals or if your treatment goals have changed.  Your blood lipids (lipid profile) should be checked yearly. You should also be checked yearly for protein in your urine (urine microalbumin).  If you have type 1 diabetes, get an eye exam 3-5 years after you are diagnosed, and then once a year after your first exam.  If you have type 2 diabetes, get an eye  exam as soon as you are diagnosed, and then once a year after your first exam. Keep your vaccines current It is recommended that you receive:  A flu (influenza) vaccine every year.  A pneumonia (pneumococcal) vaccine and a hepatitis B vaccine. If you are age 64 or older, you may get the pneumonia vaccine as a series of two separate shots. Ask your health care provider which other vaccines may be recommended. Take care of your feet Diabetes may cause you to have poor blood circulation to your legs and feet. Because of this, taking care of your feet is very important. Diabetes can cause:  The skin on the feet to get thinner, break more easily, and heal more slowly.  Nerve damage in your legs and feet, which results in decreased feeling. You may not notice minor injuries that could lead to serious problems. To avoid foot problems:  Check your skin and feet every day for cuts, bruises, redness, blisters, or sores.  Schedule a foot exam with your health care provider once every year. This exam includes: ? Inspecting of the structure and skin of your feet. ? Checking the pulses and sensation in your feet.  Make sure that your health care provider performs a visual foot exam at every medical visit.  Take care of your teeth People with poorly controlled diabetes are more likely to have gum (periodontal) disease. Diabetes can make periodontal diseases harder to control. If not treated, periodontal diseases can lead to tooth loss. To prevent this:  Brush your teeth twice a day.  Floss at least once a day.  Visit your dentist 2 times a year. Drink responsibly Limit alcohol intake to no more than 1 drink a day for nonpregnant women and 2 drinks a day for men. One drink equals 12 oz of beer, 5 oz of wine, or 1 oz of hard liquor.  It is important to eat food when you drink alcohol to avoid low blood glucose (hypoglycemia). Avoid alcohol if you:  Have a history of alcohol abuse or  dependence.  Are pregnant.  Have liver disease, pancreatitis, advanced neuropathy, or severe hypertriglyceridemia. Lessen stress Living with diabetes can be stressful. When you are experiencing stress, your blood glucose may be affected in two ways:  Stress hormones may cause your blood glucose to rise.  You may be distracted from taking good care of yourself. Be aware of your stress level and make changes to help you manage challenging situations. To lower your stress levels:  Consider joining a support group.  Do planned relaxation or meditation.  Do a hobby that you enjoy.  Maintain healthy relationships.  Exercise regularly.  Work with your health care provider or a mental health professional. Summary  You can take action to prevent or slow down problems that are caused by diabetes (diabetes mellitus). Following your diabetes plan and taking care  of yourself can reduce your risk of serious or life-threatening complications.  Follow instructions from your health care providers about managing your diabetes. Your diabetes may be managed by a team of health care providers who can teach you how to care for yourself and can answer questions that you have.  Your health care provider will tell you how often you need medical visits depending on your diabetes management plan. Keep all follow-up visits as directed. This is important so possible problems can be identified early and complications can be avoided or treated. This information is not intended to replace advice given to you by your health care provider. Make sure you discuss any questions you have with your health care provider. Document Revised: 06/06/2017 Document Reviewed: 12/06/2015 Elsevier Patient Education  [place]. Hyperglycemia Hyperglycemia occurs when the level of sugar (glucose) in the blood is too high. Glucose is a type of sugar that provides the body's main source of energy. Certain hormones (insulin  and glucagon) control the level of glucose in the blood. Insulin lowers blood glucose, and glucagon increases blood glucose. Hyperglycemia can result from having too little insulin in the bloodstream, or from the body not responding normally to insulin. Hyperglycemia occurs most often in people who have diabetes (diabetes mellitus), but it can happen in people who do not have diabetes. It can develop quickly, and it can be life-threatening if it causes you to become severely dehydrated (diabetic ketoacidosis or hyperglycemic hyperosmolar state). Severe hyperglycemia is a medical emergency. What are the causes? If you have diabetes, hyperglycemia may be caused by:  Diabetes medicine.  Medicines that increase blood glucose or affect your diabetes control.  Not eating enough, or not eating often enough.  Changes in physical activity level.  Being sick or having an infection. If you have prediabetes or undiagnosed diabetes:  Hyperglycemia may be caused by those conditions. If you do not have diabetes, hyperglycemia may be caused by:  Certain medicines, including steroid medicines, beta-blockers, epinephrine, and thiazide diuretics.  Stress.  Serious illness.  Surgery.  Diseases of the pancreas.  Infection. What increases the risk? Hyperglycemia is more likely to develop in people who have risk factors for diabetes, such as:  Having a family member with diabetes.  Having a gene for type 1 diabetes that is passed from parent to child (inherited).  Living in an area with cold weather conditions.  Exposure to certain viruses.  Certain conditions in which the body's disease-fighting (immune) system attacks itself (autoimmune disorders).  Being overweight or obese.  Having an inactive (sedentary) lifestyle.  Having been diagnosed with insulin resistance.  Having a history of prediabetes, gestational diabetes, or polycystic ovarian syndrome (PCOS).  Being of American-Indian,  African-American, Hispanic/Latino, or Asian/Pacific Islander descent. What are the signs or symptoms? Hyperglycemia may not cause any symptoms. If you do have symptoms, they may include early warning signs, such as:  Increased thirst.  Hunger.  Feeling very tired.  Needing to urinate more often than usual.  Blurry vision. Other symptoms may develop if hyperglycemia gets worse, such as:  Dry mouth.  Loss of appetite.  Fruity-smelling breath.  Weakness.  Unexpected or rapid weight gain or weight loss.  Tingling or numbness in the hands or feet.  Headache.  Skin that does not quickly return to normal after being lightly pinched and released (poor skin turgor).  Abdominal pain.  Cuts or bruises that are slow to heal. How is this diagnosed? Hyperglycemia is diagnosed with a blood test  to measure your blood glucose level. This blood test is usually done while you are having symptoms. Your health care provider may also do a physical exam and review your medical history. You may have more tests to determine the cause of your hyperglycemia, such as:  A fasting blood glucose (FBG) test. You will not be allowed to eat (you will fast) for at least 8 hours before a blood sample is taken.  An A1c (hemoglobin A1c) blood test. This provides information about blood glucose control over the previous 2-3 months.  An oral glucose tolerance test (OGTT). This measures your blood glucose at two times: ? After fasting. This is your baseline blood glucose level. ? Two hours after drinking a beverage that contains glucose. How is this treated? Treatment depends on the cause of your hyperglycemia. Treatment may include:  Taking medicine to regulate your blood glucose levels. If you take insulin or other diabetes medicines, your medicine or dosage may be adjusted.  Lifestyle changes, such as exercising more, eating healthier foods, or losing weight.  Treating an illness or infection, if this  caused your hyperglycemia.  Checking your blood glucose more often.  Stopping or reducing steroid medicines, if these caused your hyperglycemia. If your hyperglycemia becomes severe and it results in hyperglycemic hyperosmolar state, you must be hospitalized and given IV fluids. Follow these instructions at home:  General instructions  Take over-the-counter and prescription medicines only as told by your health care provider.  Do not use any products that contain nicotine or tobacco, such as cigarettes and e-cigarettes. If you need help quitting, ask your health care provider.  Limit alcohol intake to no more than 1 drink per day for nonpregnant women and 2 drinks per day for men. One drink equals 12 oz of beer, 5 oz of wine, or 1 oz of hard liquor.  Learn to manage stress. If you need help with this, ask your health care provider.  Keep all follow-up visits as told by your health care provider. This is important. Eating and drinking   Maintain a healthy weight.  Exercise regularly, as directed by your health care provider.  Stay hydrated, especially when you exercise, get sick, or spend time in hot temperatures.  Eat healthy foods, such as: ? Lean proteins. ? Complex carbohydrates. ? Fresh fruits and vegetables. ? Low-fat dairy products. ? Healthy fats.  Drink enough fluid to keep your urine clear or pale yellow. If you have diabetes:  Make sure you know the symptoms of hyperglycemia.  Follow your diabetes management plan, as told by your health care provider. Make sure you: ? Take your insulin and medicines as directed. ? Follow your exercise plan. ? Follow your meal plan. Eat on time, and do not skip meals. ? Check your blood glucose as often as directed. Make sure to check your blood glucose before and after exercise. If you exercise longer or in a different way than usual, check your blood glucose more often. ? Follow your sick day plan whenever you cannot eat or  drink normally. Make this plan in advance with your health care provider.  Share your diabetes management plan with people in your workplace, school, and household.  Check your urine for ketones when you are ill and as told by your health care provider.  Carry a medical alert card or wear medical alert jewelry. Contact a health care provider if:  Your blood glucose is at or above 240 mg/dL (13.3 mmol/L) for 2 days in a  row.  You have problems keeping your blood glucose in your target range.  You have frequent episodes of hyperglycemia. Get help right away if:  You have difficulty breathing.  You have a change in how you think, feel, or act (mental status).  You have nausea or vomiting that does not go away. These symptoms may represent a serious problem that is an emergency. Do not wait to see if the symptoms will go away. Get medical help right away. Call your local emergency services (911 in the [place] Do not drive yourself to the hospital. Summary  Hyperglycemia occurs when the level of sugar (glucose) in the blood is too high.  Hyperglycemia is diagnosed with a blood test to measure your blood glucose level. This blood test is usually done while you are having symptoms. Your health care provider may also do a physical exam and review your medical history.  If you have diabetes, follow your diabetes management plan as told by your health care provider.  Contact your health care provider if you have problems keeping your blood glucose in your target range. This information is not intended to replace advice given to you by your health care provider. Make sure you discuss any questions you have with your health care provider. Document Revised: 11/24/2015 Document Reviewed: 11/24/2015 Elsevier Patient Education  2020 [employer].   __________________________________________________________________________________  Start with the following: 1) Don't skip meals. 2) Be careful  with portions of carbohydrate-containing foods. 3) Balance meals by including non-starchy vegetables and a healthy protein/fat.   Carbohydrate Counting For People With Diabetes  Foods with carbohydrates make your blood glucose level go up. Learning how to count carbohydrates can help you control your blood glucose levels. First, identify the foods you eat that contain carbohydrates. Then, using the Foods with Carbohydrates chart, determine about how much carbohydrates are in your meals and snacks. Make sure you are eating foods with fiber, protein, and healthy fat along with your carbohydrate foods. Foods with Carbohydrates The following table shows carbohydrate foods that have about 15 grams of carbohydrate each. Using measuring cups, spoons, or a food scale when you first begin learning about carbohydrate counting can help you learn about the portion sizes you typically eat. The following foods have 15 grams carbohydrate each:  Grains . 1 slice bread (1 ounce)  . 1 small tortilla (6-inch size)  .  large bagel (1 ounce)  . 1/3 cup pasta or rice (cooked)  .  hamburger or hot dog bun ( ounce)  .  cup cooked cereal  .  to  cup ready-to-eat cereal  . 2 taco shells (5-inch size) Fruit . 1 small fresh fruit ( to 1 cup)  .  medium banana  . 17 small grapes (3 ounces)  . 1 cup melon or berries  .  cup canned or frozen fruit  . 2 tablespoons dried fruit (blueberries, cherries, cranberries, raisins)  .  cup unsweetened fruit juice  Starchy Vegetables .  cup cooked beans, peas, corn, potatoes/sweet potatoes  .  large baked potato (3 ounces)  . 1 cup acorn or butternut squash  Snack Foods . 3 to 6 crackers  . 8 potato chips or 13 tortilla chips ( ounce to 1 ounce)  . 3 cups popped popcorn  Dairy . 3/4 cup (6 ounces) nonfat plain yogurt, or yogurt with sugar-free sweetener  . 1 cup milk  . 1 cup plain rice, soy, coconut or flavored almond milk Sweets and Desserts .  cup  ice  cream or frozen yogurt  . 1 tablespoon jam, jelly, pancake syrup, table sugar, or honey  . 2 tablespoons light pancake syrup  . 1 inch square of frosted cake or 2 inch square of unfrosted cake  . 2 small cookies (2/3 ounce each) or  large cookie  Sometimes you'll have to estimate carbohydrate amounts if you don't know the exact recipe. One cup of mixed foods like soups can have 1 to 2 carbohydrate servings, while some casseroles might have 2 or more servings of carbohydrate. Foods that have less than 20 calories in each serving can be counted as "free" foods. Count 1 cup raw vegetables, or  cup cooked non-starchy vegetables as "free" foods. If you eat 3 or more servings at one meal, then count them as 1 carbohydrate serving.  Foods without Carbohydrates  Not all foods contain carbohydrates. Meat, some dairy, fats, non-starchy vegetables, and many beverages don't contain carbohydrate. So when you count carbohydrates, you can generally exclude chicken, pork, beef, fish, seafood, eggs, tofu, cheese, butter, sour cream, avocado, nuts, seeds, olives, mayonnaise, water, black coffee, unsweetened tea, and zero-calorie drinks. Vegetables with no or low carbohydrate include green beans, cauliflower, tomatoes, and onions. How much carbohydrate should I eat at each meal?  Carbohydrate counting can help you plan your meals and manage your weight. Following are some starting points for carbohydrate intake at each meal. Work with your registered dietitian nutritionist to find the best range that works for your blood glucose and weight.   To Lose Weight To Maintain Weight  Women 2 - 3 carb servings 3 - 4 carb servings  Men 3 - 4 carb servings 4 - 5 carb servings  Checking your blood glucose after meals will help you know if you need to adjust the timing, type, or number of carbohydrate servings in your meal plan. Achieve and keep a healthy body weight by balancing your food intake and physical  activity.  Tips How should I plan my meals?  Plan for half the food on your plate to include non-starchy vegetables, like salad greens, broccoli, or carrots. Try to eat 3 to 5 servings of non-starchy vegetables every day. Have a protein food at each meal. Protein foods include chicken, fish, meat, eggs, or beans (note that beans contain carbohydrate). These two food groups (non-starchy vegetables and proteins) are low in carbohydrate. If you fill up your plate with these foods, you will eat less carbohydrate but still fill up your stomach. Try to limit your carbohydrate portion to  of the plate.  What fats are healthiest to eat?  Diabetes increases risk for heart disease. To help protect your heart, eat more healthy fats, such as olive oil, nuts, and avocado. Eat less saturated fats like butter, cream, and high-fat meats, like bacon and sausage. Avoid trans fats, which are in all foods that list "partially hydrogenated oil" as an ingredient. What should I drink?  Choose drinks that are not sweetened with sugar. The healthiest choices are water, carbonated or seltzer waters, and tea and coffee without added sugars.  Sweet drinks will make your blood glucose go up very quickly. One serving of soda or energy drink is  cup. It is best to drink these beverages only if your blood glucose is low.  Artificially sweetened, or diet drinks, typically do not increase your blood glucose if they have zero calories in them. Read labels of beverages, as some diet drinks do have carbohydrate and will raise your blood glucose.  Label Reading Tips Read Nutrition Facts labels to find out how many grams of carbohydrate are in a food you want to eat. Don't forget: sometimes serving sizes on the label aren't the same as how much food you are going to eat, so you may need to calculate how much carbohydrate is in the food you are serving yourself.   Carbohydrate Counting for People with Diabetes Sample 1-Day Menu  Breakfast   cup yogurt, low fat, low sugar (1 carbohydrate serving)   cup cereal, ready-to-eat, unsweetened (1 carbohydrate serving)  1 cup strawberries (1 carbohydrate serving)   cup almonds ( carbohydrate serving)  Lunch 1, 5 ounce can chunk light tuna  2 ounces cheese, low fat cheddar  6 whole wheat crackers (1 carbohydrate serving)  1 small apple (1 carbohydrate servings)   cup carrots ( carbohydrate serving)   cup snap peas  1 cup 1% milk (1 carbohydrate serving)   Evening Meal Stir fry made with: 3 ounces chicken  1 cup brown rice (3 carbohydrate servings)   cup broccoli ( carbohydrate serving)   cup green beans   cup onions  1 tablespoon olive oil  2 tablespoons teriyaki sauce ( carbohydrate serving)  Evening Snack 1 extra small banana (1 carbohydrate serving)  1 tablespoon peanut butter   Carbohydrate Counting for People with Diabetes Vegan Sample 1-Day Menu  Breakfast 1 cup cooked oatmeal (2 carbohydrate servings)   cup blueberries (1 carbohydrate serving)  2 tablespoons flaxseeds  1 cup soymilk fortified with calcium and vitamin D  1 cup coffee  Lunch 2 slices whole wheat bread (2 carbohydrate servings)   cup baked tofu   cup lettuce  2 slices tomato  2 slices avocado   cup baby carrots ( carbohydrate serving)  1 orange (1 carbohydrate serving)  1 cup soymilk fortified with calcium and vitamin D   Evening Meal Burrito made with: 1 6-inch corn tortilla (1 carbohydrate serving)  1 cup refried vegetarian beans (2 carbohydrate servings)   cup chopped tomatoes   cup lettuce   cup salsa  1/3 cup brown rice (1 carbohydrate serving)  1 tablespoon olive oil for rice   cup zucchini   Evening Snack 6 small whole grain crackers (1 carbohydrate serving)  2 apricots ( carbohydrate serving)   cup unsalted peanuts ( carbohydrate serving)    Carbohydrate Counting for People with Diabetes Vegetarian (Lacto-Ovo) Sample 1-Day Menu  Breakfast 1 cup cooked oatmeal (2  carbohydrate servings)   cup blueberries (1 carbohydrate serving)  2 tablespoons flaxseeds  1 egg  1 cup 1% milk (1 carbohydrate serving)  1 cup coffee  Lunch 2 slices whole wheat bread (2 carbohydrate servings)  2 ounces low-fat cheese   cup lettuce  2 slices tomato  2 slices avocado   cup baby carrots ( carbohydrate serving)  1 orange (1 carbohydrate serving)  1 cup unsweetened tea  Evening Meal Burrito made with: 1 6-inch corn tortilla (1 carbohydrate serving)   cup refried vegetarian beans (1 carbohydrate serving)   cup tomatoes   cup lettuce   cup salsa  1/3 cup brown rice (1 carbohydrate serving)  1 tablespoon olive oil for rice   cup zucchini  1 cup 1% milk (1 carbohydrate serving)  Evening Snack 6 small whole grain crackers (1 carbohydrate serving)  2 apricots ( carbohydrate serving)   cup unsalted peanuts ( carbohydrate serving)    Copyright 2020  Academy of [employer]etics. All rights reserved.  Using Nutrition Labels: Carbohydrate  .  Serving Size  . Look at the serving size. All the information on the label is based on this portion. Randol Kern Per Container  . The number of servings contained in the package. . Guidelines for Carbohydrate  . Look at the total grams of carbohydrate in the serving size.  . 1 carbohydrate choice = 15 grams of carbohydrate. Range of Carbohydrate Grams Per Choice  Carbohydrate Grams/Choice Carbohydrate Choices  6-10   11-20 1  21-25 1  26-35 2  36-40 2  41-50 3  51-55 3  56-65 4  66-70 4  71-80 5    Copyright 2020  Academy of [employer] and Dietetics. All rights reserved.

## 2019-06-07 NOTE — Progress Notes (Signed)
Occupational Therapy Treatment Patient Details Name: Scott Day MRN: [id number] DOB: 28-Jun-1960 Today's Date: 06/07/2019    History of present illness 59 y.o. male with a history of colon cancer in 2012 and GERD, who presents with left-sided weakness. 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    OT comments  Pt making progress in therapy, however continues to demonstrate left sided inattention, impulsivity, and decreased awareness into extent of deficits. Continued education with pt on safety strategies and fall prevention with poor to fair understanding. Educated pt on visual scanning task to promote attention to left side in order to increase independence and safety with daily activities. Pt completed visual scanning task 3 times with increasing difficulty. Started objects more midline, progressing to all objects in left visual field towards end. Utilized grooming and food items. Pt required max cues to attend to task attempting to get back into bed on several occasions. Initially pt 25% accurate with locating items, progressing to 75% accuracy at end. Used verbal compensatory strategy to increase awareness to left side with pt able to recall, repeat, and use phrase "When in doubt, look left" consistently at end of session. Provided education to pt's daughter about visual scanning tasks to complete with pt as well as sitting on pt's left side to improve attention. All questions/concerns answered at this time. OT will continue to follow acutely. Continue to recommend CIR placement for additional rehab prior to discharge home.    Follow Up Recommendations  CIR    Equipment Recommendations  Other (comment)(TBD at next venue of care.)    Recommendations for Other Services      Precautions / Restrictions Precautions Precautions: Fall;Other (comment) Precaution Comments: Inattention of left Restrictions Weight Bearing Restrictions: No        Mobility Bed Mobility Overal bed mobility: Needs Assistance Bed Mobility: Supine to Sit;Sit to Supine;Rolling Rolling: Supervision   Supine to sit: Min assist;HOB elevated Sit to supine: Min guard;HOB elevated   General bed mobility comments: Assist for trunk. Cues for safety throughout  Transfers                 General transfer comment: Pt engaged in seated visual scanning task this date. No functional transfers completed.     Balance Overall balance assessment: Needs assistance Sitting-balance support: No upper extremity supported;Feet supported Sitting balance-Leahy Scale: Fair                                     ADL either performed or assessed with clinical judgement   ADL Overall ADL's : Needs assistance/impaired Eating/Feeding: Supervision/ safety Eating/Feeding Details (indicate cue type and reason): Pt able to reach for drink and bring to mouth with RUE.                                    General ADL Comments: Worked on visual scanning task with familiar daily items while seated EOB. Noted improved visual scanning to the left at end of session.      Vision   Vision Assessment?: Vision impaired- to be further tested in functional context Additional Comments: Left sided inattention with right eye gaze and head turn. Max verbal and visual cues to scan to the left. Improved scanning to left noted at end of session.    Perception  Praxis      Cognition Arousal/Alertness: Awake/alert Behavior During Therapy: Impulsive Overall Cognitive Status: Impaired/Different from baseline Area of Impairment: Attention;Following commands;Safety/judgement;Memory;Problem solving;Awareness                   Current Attention Level: Sustained Memory: Decreased recall of precautions;Decreased short-term memory Following Commands: Follows one step commands inconsistently;Follows one step commands with increased  time Safety/Judgement: Decreased awareness of safety;Decreased awareness of deficits Awareness: Intellectual Problem Solving: Slow processing;Difficulty sequencing;Requires verbal cues;Requires tactile cues General Comments: Pt continues to be impulsive, attempting to get back into bed during EOB tasks. Poor insight and awareness into deficits and extent of deficits. Pt required mod to max cues to attend to task.         Exercises     Shoulder Instructions       General Comments VSS on RA. Pt's daughter present for session. Educated daughter on visual scanning tasks to complete with pt as well as sitting on pt's left side.    Pertinent Vitals/ Pain       Pain Assessment: No/denies pain Pain Score: 10-Worst pain ever Pain Location: Right shoulder Pain Descriptors / Indicators: Aching Pain Intervention(s): Monitored during session;Repositioned;Other (comment)(Gentle massage provided with pt reporting improvement)  Home Living                                          Prior Functioning/Environment              Frequency           Progress Toward Goals  OT Goals(current goals can now be found in the care plan section)  Progress towards OT goals: Progressing toward goals  ADL Goals Pt Will Perform Grooming: with min guard assist;standing Pt Will Perform Lower Body Dressing: with min guard assist;sit to/from stand Pt Will Transfer to Toilet: with min guard assist;bedside commode;ambulating Pt Will Perform Toileting - Clothing Manipulation and hygiene: with min guard assist;sitting/lateral leans;sit to/from stand Additional ADL Goal #1: Pt will locate three grooming items in left visual field with Min cues Additional ADL Goal #2: Pt will demonstrate selective attention during ADLs with Min cues Additional ADL Goal #3: Pt will demonstrate emergent awareness during ADLs with Min cues  Plan Discharge plan remains appropriate    Co-evaluation                  AM-PAC OT "6 Clicks" Daily Activity     Outcome Measure   Help from another person eating meals?: A Little Help from another person taking care of personal grooming?: A Little Help from another person toileting, which includes using toliet, bedpan, or urinal?: A Lot Help from another person bathing (including washing, rinsing, drying)?: A Lot Help from another person to put on and taking off regular upper body clothing?: A Little Help from another person to put on and taking off regular lower body clothing?: A Lot 6 Click Score: 15    End of Session    OT Visit Diagnosis: Unsteadiness on feet (R26.81);Other abnormalities of gait and mobility (R26.89);Muscle weakness (generalized) (M62.81);Hemiplegia and hemiparesis;Low vision, both eyes (H54.2);Ataxia, unspecified (R27.0);Other symptoms and signs involving cognitive function Hemiplegia - Right/Left: Left Hemiplegia - dominant/non-dominant: Non-Dominant Hemiplegia - caused by: Cerebral infarction   Activity Tolerance Patient tolerated treatment well   Patient Left in bed;with call bell/phone within reach;with bed alarm set;with family/visitor present  Nurse Communication Mobility status        Time: [id number] OT Time Calculation (min): 43 min  Charges: OT General Charges $OT Visit: 1 Visit OT Treatments $Therapeutic Activity: 38-52 mins  Mauri Brooklyn OTR/L [phone]   Mauri Brooklyn 06/07/2019, 3:53 PM

## 2019-06-07 NOTE — Progress Notes (Signed)
Physical Therapy Treatment Patient Details Name: Scott Day MRN: [id number] DOB: 1960/07/14 Today's Date: 06/07/2019    History of Present Illness 59 y.o. male with a history of colon cancer in 2012 and GERD, who presents with left-sided weakness. 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     PT Comments    Pt tolerated treatment well. Pt remains impulsive at this time and continues to have left inattention although improved from previous PT sessions. Pt demonstrates improved compensatory techniques of head turns but continues to lack consistent ability to scan left with eye movement only unless verbally cued by PT. Pt scored a 30/56 on BERG which indicates the pt remains at a high falls risk at this time. Due to patient's impaired safety awareness and balance the patient will continue to benefit from aggressive mobilization and high intensity inpatient PT services at the time of discharge.   Follow Up Recommendations  CIR;Supervision/Assistance - 24 hour     Equipment Recommendations  (defer to post-acute setting)    Recommendations for Other Services       Precautions / Restrictions Precautions Precautions: Fall;Other (comment) Precaution Comments: Inattention of left Restrictions Weight Bearing Restrictions: No    Mobility  Bed Mobility Overal bed mobility: Needs Assistance Bed Mobility: Supine to Sit;Sit to Supine Rolling: Supervision   Supine to sit: Supervision Sit to supine: Supervision   General bed mobility comments: Assist for trunk. Cues for safety throughout  Transfers Overall transfer level: Needs assistance Equipment used: None Transfers: Sit to/from Stand Sit to Stand: Supervision         General transfer comment: Pt engaged in seated visual scanning task this date. No functional transfers completed.   Ambulation/Gait Ambulation/Gait assistance: Min guard Gait Distance (Feet): (limited community  distances) Assistive device: None Gait Pattern/deviations: Step-through pattern;Drifts right/left Gait velocity: slowed Gait velocity interpretation: 1.31 - 2.62 ft/sec, indicative of limited community ambulator General Gait Details: pt drifts right and left during ambulation, is able to slow gait speed but unable to significantly increase gait speed when cued at this time. Pt scans left and right with head movement but still demonstrates difficulty scanning left with eye movement only, needing verbal cues to scan left without head turns. Pt is able to change step length with both feet. Pt experiences 2-3 minor LOB which he self-corrects   Stairs             Wheelchair Mobility    Modified Rankin (Stroke Patients Only) Modified Rankin (Stroke Patients Only) Pre-Morbid Rankin Score: No symptoms Modified Rankin: Moderately severe disability     Balance Overall balance assessment: Needs assistance Sitting-balance support: No upper extremity supported;Feet supported Sitting balance-Leahy Scale: Good Sitting balance - Comments: supervision due to impulsivity   Standing balance support: No upper extremity supported;During functional activity Standing balance-Leahy Scale: Poor Standing balance comment: minA                 Standardized Balance Assessment Standardized Balance Assessment : Berg Balance Test Berg Balance Test Sit to Stand: Able to stand  independently using hands Standing Unsupported: Able to stand 2 minutes with supervision Sitting with Back Unsupported but Feet Supported on Floor or Stool: Able to sit safely and securely 2 minutes Stand to Sit: Sits safely with minimal use of hands Transfers: Able to transfer safely, minor use of hands Standing Unsupported with Eyes Closed: Able to stand 10 seconds with supervision Standing Ubsupported with Feet Together: Able to place feet together  independently but unable to hold for 30 seconds From Standing, Reach  Forward with Outstretched Arm: Reaches forward but needs supervision From Standing Position, Pick up Object from Floor: Unable to pick up shoe, but reaches 2-5 cm (1-2") from shoe and balances independently From Standing Position, Turn to Look Behind Over each Shoulder: Needs supervision when turning Turn 360 Degrees: Needs close supervision or verbal cueing Standing Unsupported, Alternately Place Feet on Step/Stool: Needs assistance to keep from falling or unable to try Standing Unsupported, One Foot in Front: Loses balance while stepping or standing Standing on One Leg: Able to lift leg independently and hold equal to or more than 3 seconds Total Score: 30        Cognition Arousal/Alertness: Awake/alert Behavior During Therapy: Impulsive Overall Cognitive Status: Impaired/Different from baseline Area of Impairment: Attention;Following commands;Safety/judgement;Awareness;Problem solving                   Current Attention Level: Alternating Memory: Decreased recall of precautions;Decreased short-term memory Following Commands: Follows one step commands consistently;Follows multi-step commands inconsistently(requires cues for multi-step commands due to poor attention) Safety/Judgement: Decreased awareness of safety;Decreased awareness of deficits Awareness: Intellectual Problem Solving: Slow processing;Difficulty sequencing;Requires verbal cues;Requires tactile cues General Comments: Pt continues to be impulsive, attempting to get back into bed during EOB tasks. Poor insight and awareness into deficits and extent of deficits. Pt required mod to max cues to attend to task.       Exercises      General Comments General comments (skin integrity, edema, etc.): VSS, pt's daughter and son present and very supportive      Pertinent Vitals/Pain Pain Assessment: Faces Pain Score: 10-Worst pain ever Faces Pain Scale: Hurts a little bit Pain Location: R shoulder Pain Descriptors /  Indicators: Aching Pain Intervention(s): Limited activity within patient's tolerance    Home Living                      Prior Function            PT Goals (current goals can now be found in the care plan section) Acute Rehab PT Goals Patient Stated Goal: Go outside and feel the sunshine Progress towards PT goals: Progressing toward goals    Frequency    Min 4X/week      PT Plan Current plan remains appropriate    Co-evaluation              AM-PAC PT "6 Clicks" Mobility   Outcome Measure  Help needed turning from your back to your side while in a flat bed without using bedrails?: None Help needed moving from lying on your back to sitting on the side of a flat bed without using bedrails?: None Help needed moving to and from a bed to a chair (including a wheelchair)?: None Help needed standing up from a chair using your arms (e.g., wheelchair or bedside chair)?: None Help needed to walk in hospital room?: A Little Help needed climbing 3-5 steps with a railing? : A Little 6 Click Score: 22    End of Session   Activity Tolerance: Patient tolerated treatment well Patient left: in bed;with call bell/phone within reach;with bed alarm set;with family/visitor present Nurse Communication: Mobility status PT Visit Diagnosis: Unsteadiness on feet (R26.81);Other abnormalities of gait and mobility (R26.89);Hemiplegia and hemiparesis Hemiplegia - Right/Left: Left Hemiplegia - dominant/non-dominant: Non-dominant Hemiplegia - caused by: Cerebral infarction     Time: [id number] PT Time Calculation (min) (ACUTE ONLY): 23 min  Charges:  $Gait Training: 8-22 mins $Neuromuscular Re-education: 8-22 mins                     Zenaida Niece, PT, DPT Acute Rehabilitation Pager: [phone]    Zenaida Niece 06/07/2019, 4:57 PM

## 2019-06-07 NOTE — Progress Notes (Signed)
Inpatient Diabetes Program Recommendations  AACE/ADA: New Consensus Statement on Inpatient Glycemic Control (2015)  Target Ranges:  Prepandial:   less than 140 mg/dL      Peak postprandial:   less than 180 mg/dL (1-2 hours)      Critically ill patients:  140 - 180 mg/dL   Lab Results  Component Value Date   GLUCAP 121 (H) 06/07/2019   HGBA1C 8.3 (H) 06/05/2019    Spoke with patient and daughter regarding new diagnosis for diabetes.   Reviewed patient's current A1c of 8.3%. Explained what a A1c is and what it measures. Also reviewed goal A1c with patient, importance of good glucose control @ home, and blood sugar goals. Reviewed patho of diabetes, need for lifestyle changes, impact of glucose and risk for further CVA events, benefits/side effects of Metformin, vascular changes and commorbidities.  Patient will need a meter at discharge. Relion information provided and attached to DC summary. Encouraged to begin learning how to check CBGs while inpatient and reviewed recommended frequency for CBG checks.   In talking with the daughter, she reports patient consumes large amounts of soda. When asked specific quantities, she reports patient has consumed 1 case of regular Pepsi per day (12 cans) and also adds honey to morning coffee. She states, "He has done this for as long as I can remember and it will be difficult to change this routine, however, I am dedicated to helping my dad." Discussed importance of working towards changing this habit, provided suggestions for change, encouraged goal setting, reviewed alternatives and stressed the importance of adding in alternatives. Reviewed signs and symptoms of hyper vs hypo glycemia and other survival skills and questioned that he may not require as much liquid/desire to drink soda once intake of sugar is improved. Dietitian has also met with patient to reinforce this. Daughter plans to begin working on this with patient and providing alternatives.    Encouraged to follow up with a PCP and reviewed when to call. TOC working with patient on following appointment.  Patient and family have no further questions at this time.   Thanks, Bronson Curb, MSN, RNC-OB Diabetes Coordinator [phone] (8a-5p)

## 2019-06-07 NOTE — Progress Notes (Signed)
    [facility] has been requested to perform a transesophageal echocardiogram on Tramane Wernicke [place] for stroke.  After careful review of history and examination, the risks and benefits of transesophageal echocardiogram have been explained including risks of esophageal damage, perforation (1:10,000 risk), bleeding, pharyngeal hematoma as well as other potential complications associated with conscious sedation including aspiration, arrhythmia, respiratory failure and death. Alternatives to treatment were discussed, questions were answered. Patient is willing to proceed.   Kathyrn Drown, NP  06/07/2019 5:08 PM

## 2019-06-07 NOTE — Progress Notes (Signed)
Referring Physician(s): Code Stroke- [name]  Supervising Physician: Pedro Earls  Patient Status:  Scott Day  Chief Complaint:  F/U stroke  Brief History:  History of acute CVA s/p cerebral arteriogram with emergent mechanical thrombectomy and stent placement of mid/distal right MCA M1 occlusion achieving a TICI 3 revascularization 06/04/2019 by Dr. Karenann Cai.  Subjective:  Scott Day expresses Scott Day wants to get up and walk to the bathroom. Scott Day is currently on bedrest until PT eval.  Scott Day at bedside. She tells [place] Scott Day is looking much better today. She says she has noticed "there is no filter" and Scott Day says what is on his mind.  Allergies: Patient has no known allergies.  Medications: Prior to Admission medications   Medication Sig Start Date End Date Taking? Authorizing Provider  HYDROcodone-acetaminophen (NORCO) 5-325 MG tablet 1-2 tabs po q6 hours prn pain Patient not taking: Reported on 06/04/2019 09/20/17   Leanora Cover, MD     Vital Signs: BP 137/79 (BP Location: Left Arm)   Pulse 72   Temp 98 F (36.7 C) (Oral)   Resp 16   Ht 5' 11" (1.803 m)   Wt 105 kg   SpO2 98%   BMI 32.29 kg/m   Physical Exam Constitutional:      Appearance: Normal appearance.  HENT:     Head: Normocephalic and atraumatic.  Eyes:     Comments: Right gaze preference but able to cross midline.   Cardiovascular:     Rate and Rhythm: Normal rate.  Pulmonary:     Effort: Pulmonary effort is normal. No respiratory distress.  Abdominal:     Tenderness: There is abdominal tenderness.  Musculoskeletal:     Comments: Moves all 4's. Left grip slightly weaker, Scott Day states previous surgery on that wrist could be the cause. Groin stick stable  Skin:    General: Skin is warm and dry.  Neurological:     Mental Status: Scott Day is alert and oriented to person, place, and time.     Comments: Dysarthric, seems slightly improved. Facial asymmetry also improved  but still obvious with big smile.  Psychiatric:        Behavior: Behavior normal.     Imaging: CT ANGIO HEAD W OR WO CONTRAST  Addendum Date: 06/04/2019   ADDENDUM REPORT: 06/04/2019 12:21 ADDENDUM: Contrast dose is 100 mL Omnipaque 350 Electronically Signed   By: Macy Mis M.D.   On: 06/04/2019 12:21   Result Date: 06/04/2019 CLINICAL DATA:  Code stroke.  Slurred speech and left facial droop EXAM: CT ANGIOGRAPHY HEAD AND NECK CT PERFUSION BRAIN TECHNIQUE: Multidetector CT imaging of the head and neck was performed using the standard protocol during bolus administration of intravenous contrast. Multiplanar CT image reconstructions and MIPs were obtained to evaluate the vascular anatomy. Carotid stenosis measurements (when applicable) are obtained utilizing NASCET criteria, using the distal internal carotid diameter as the denominator. Multiphase CT imaging of the brain was performed following IV bolus contrast injection. Subsequent parametric perfusion maps were calculated using RAPID software. COMPARISON:  None. FINDINGS: CT HEAD Brain: There is no acute intracranial hemorrhage mass effect edema. Gray-white differentiation is preserved. Ventricles and sulci normal in size and configuration. There is no extra-axial fluid collection. Vascular: Hyperdense vessel. Skull: Unremarkable. Sinuses/Orbits: Lobular mucosal thickening with greatest involvement of the right maxillary sinus. No acute orbital finding. Other: Mastoid air cells are clear ASPECTS [facility] Stroke Program Early CT Score) - Ganglionic level infarction (caudate, lentiform nuclei, internal  capsule, insula, M1-M3 cortex): 7 - Supraganglionic infarction (M4-M6 cortex): 3 Total score (0-10 with 10 being normal): 10 Review of the MIP images confirms the above findings CTA NECK Aortic arch: Great vessel origins are patent. Right carotid system: Patent. There is trace calcified plaque at the ICA origin measurable stenosis. Left carotid system:  Patent. Eccentric noncalcified plaque causing less than 50% stenosis. Mild calcified plaque at the ICA origin causing less than 50% stenosis. Vertebral arteries: Patent.  Left vertebral artery dominant. Skeleton: Cervical spine degenerative changes. Other neck: No mass or adenopathy Upper chest: No apical lung mass. Review of the MIP images confirms the above findings CTA HEAD Anterior circulation: Intracranial internal carotid arteries patent. There is nonocclusive thrombus within distal right M1 MCA extending to the bifurcation. Left middle cerebral artery is patent. Anterior cerebral arteries are patent. Congenitally absent right A1 ACA. Posterior circulation: Intracranial vertebral arteries, basilar artery, and posterior cerebral arteries are patent. There are bilateral patent posterior communicating arteries. Venous sinuses: As permitted by contrast timing, patent. Review of the MIP images confirms the above findings CT Brain Perfusion: CBF (<30%) Volume: 52m Perfusion (Tmax>6.0s) volume: 1564mMismatch Volume: 12653mnfarction Location: Right MCA territory IMPRESSION: No acute intracranial hemorrhage or evidence of acute infarction. ASPECT score is 10. 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 Perfusion imaging demonstrates 25 mL core infarction and 126 mL territory at risk in the right MCA territory. No hemodynamically significant stenosis in the neck. These results were called by telephone at the time of interpretation on 06/04/2019 at 11:53 am to provider JONWindhaven Psychiatric HospitalLESAlexis Goodellwho verbally acknowledged these results. Electronically Signed: By: PraMacy MisD. On: 06/04/2019 11:59   DG Abd 1 View  Result Date: 06/05/2019 CLINICAL DATA:  New NG tube EXAM: ABDOMEN - 1 VIEW COMPARISON:  None. FINDINGS: Advancement of NG tube such that the tip is at the expected location of the junction of the second third portion the duodenum. IMPRESSION: NG tube  extends into duodenum Electronically Signed   By: SteSuzy BouchardD.   On: 06/05/2019 14:28   DG Abd 1 View  Result Date: 06/04/2019 CLINICAL DATA:  Nasogastric tube placement. EXAM: ABDOMEN - 1 VIEW COMPARISON:  None. FINDINGS: A nasogastric tube is seen with its distal tip overlying the body of the stomach. This is approximately 7.8 cm distal to the expected region of the gastroesophageal junction. The bowel gas pattern is normal. No radio-opaque calculi or other significant radiographic abnormality are seen. IMPRESSION: Nasogastric tube positioning, as described above. Further advancement of the NG tube by approximately 5 cm is recommended to decrease the risk of aspiration. Electronically Signed   By: ThaVirgina NorfolkD.   On: 06/04/2019 20:23   CT HEAD WO CONTRAST  Result Date: 06/05/2019 CLINICAL DATA:  Follow-up stroke.  Right MCA territory infarctions. EXAM: CT HEAD WITHOUT CONTRAST TECHNIQUE: Contiguous axial images were obtained from the base of the skull through the vertex without intravenous contrast. COMPARISON:  Multiple CT studies over the last 2 days. FINDINGS: Brain: Patchy low-density in the right middle cerebral artery territory continues to become more evident by CT consistent with multiple infarctions in that region. These are notable in the insula, right lateral and posterior temporal lobe, right frontal operculum and right frontal parietal junction. No significant swelling, mass effect or any detectable hemorrhage. Right MCA stent as seen previously. Vascular: There is atherosclerotic calcification of the major vessels at the base of the  brain. Right MCA stent as seen previously. Skull: Negative Sinuses/Orbits: Chronic sinusitis particularly affecting the right maxillary sinus. Orbits negative. Other: None IMPRESSION: Multiple infarctions in the right middle cerebral artery territory becoming more evident on CT, with increasing low density. No evidence of significant mass effect  or any hemorrhage. Electronically Signed   By: Nelson Chimes M.D.   On: 06/05/2019 15:38   CT HEAD WO CONTRAST  Result Date: 06/04/2019 CLINICAL DATA:  Stroke, follow-up. EXAM: CT HEAD WITHOUT CONTRAST TECHNIQUE: Contiguous axial images were obtained from the base of the skull through the vertex without intravenous contrast. COMPARISON:  Noncontrast head CT and CT angiogram head/neck performed earlier the same day 06/04/2019 FINDINGS: Brain: There has been interval development of patchy acute cortical/subcortical infarction changes within the right MCA vascular territory. Findings are most notable within the right frontal operculum, right insula and within portions of the right temporoparietal junction (series 3, image 20) (series 3, image 18) (series 5, image 48). No evidence of hemorrhagic conversion. No significant mass effect. No midline shift. There is no hydrocephalus. No extra-axial fluid collection is identified. Vascular: A stent is now present in the region of the M1 right middle cerebral artery. Otherwise, no hyperdense vessel is identified. Skull: Normal. Negative for fracture or focal lesion. Sinuses/Orbits: Visualized orbits demonstrate no acute abnormality. Moderate mucosal thickening and frothy secretions within the right maxillary sinus. Additional mild scattered paranasal sinus mucosal thickening. Left frontal sinus mucous retention cyst. No significant mastoid effusion. IMPRESSION: Interval development of patchy acute cortical/subcortical infarction changes within the right MCA vascular territory most notable within the right frontal operculum, right insula and portions of the right temporoparietal junction. No hemorrhagic conversion. No midline shift or significant mass effect. Paranasal sinus disease as described, most notably right maxillary. Electronically Signed   By: Kellie Simmering DO   On: 06/04/2019 18:43   CT ANGIO NECK W OR WO CONTRAST  Addendum Date: 06/04/2019   ADDENDUM REPORT:  06/04/2019 12:21 ADDENDUM: Contrast dose is 100 mL Omnipaque 350 Electronically Signed   By: Macy Mis M.D.   On: 06/04/2019 12:21   Result Date: 06/04/2019 CLINICAL DATA:  Code stroke.  Slurred speech and left facial droop EXAM: CT ANGIOGRAPHY HEAD AND NECK CT PERFUSION BRAIN TECHNIQUE: Multidetector CT imaging of the head and neck was performed using the standard protocol during bolus administration of intravenous contrast. Multiplanar CT image reconstructions and MIPs were obtained to evaluate the vascular anatomy. Carotid stenosis measurements (when applicable) are obtained utilizing NASCET criteria, using the distal internal carotid diameter as the denominator. Multiphase CT imaging of the brain was performed following IV bolus contrast injection. Subsequent parametric perfusion maps were calculated using RAPID software. COMPARISON:  None. FINDINGS: CT HEAD Brain: There is no acute intracranial hemorrhage mass effect edema. Gray-white differentiation is preserved. Ventricles and sulci normal in size and configuration. There is no extra-axial fluid collection. Vascular: Hyperdense vessel. Skull: Unremarkable. Sinuses/Orbits: Lobular mucosal thickening with greatest involvement of the right maxillary sinus. No acute orbital finding. Other: Mastoid air cells are clear ASPECTS [facility] Stroke Program Early CT Score) - Ganglionic level infarction (caudate, lentiform nuclei, internal capsule, insula, M1-M3 cortex): 7 - Supraganglionic infarction (M4-M6 cortex): 3 Total score (0-10 with 10 being normal): 10 Review of the MIP images confirms the above findings CTA NECK Aortic arch: Great vessel origins are patent. Right carotid system: Patent. There is trace calcified plaque at the ICA origin measurable stenosis. Left carotid system: Patent. Eccentric noncalcified plaque causing less than  50% stenosis. Mild calcified plaque at the ICA origin causing less than 50% stenosis. Vertebral arteries: Patent.  Left  vertebral artery dominant. Skeleton: Cervical spine degenerative changes. Other neck: No mass or adenopathy Upper chest: No apical lung mass. Review of the MIP images confirms the above findings CTA HEAD Anterior circulation: Intracranial internal carotid arteries patent. There is nonocclusive thrombus within distal right M1 MCA extending to the bifurcation. Left middle cerebral artery is patent. Anterior cerebral arteries are patent. Congenitally absent right A1 ACA. Posterior circulation: Intracranial vertebral arteries, basilar artery, and posterior cerebral arteries are patent. There are bilateral patent posterior communicating arteries. Venous sinuses: As permitted by contrast timing, patent. Review of the MIP images confirms the above findings CT Brain Perfusion: CBF (<30%) Volume: 46m Perfusion (Tmax>6.0s) volume: 1523mMismatch Volume: 12661mnfarction Location: Right MCA territory IMPRESSION: No acute intracranial hemorrhage or evidence of acute infarction. ASPECT score is 10. 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 Perfusion imaging demonstrates 25 mL core infarction and 126 mL territory at risk in the right MCA territory. No hemodynamically significant stenosis in the neck. These results were called by telephone at the time of interpretation on 06/04/2019 at 11:53 am to provider JONPark Eye And SurgicenterLESAlexis Goodellwho verbally acknowledged these results. Electronically Signed: By: PraMacy MisD. On: 06/04/2019 11:59   IR Intra Cran Stent  Result Date: 06/04/2019 CLINICAL DATA:  [age]ar old male with past medical history significant for colon cancer in 2012. Scott Day developed left-sided weakness and neglect on awakening this morning. [facility] showed small hypodensity in the right insula with no hemorrhage. NIHSS 10. CT angiogram showed tapering of the right M1/MCA with short-segment filling defect. CT perfusion  showed an estimated core volume of 25 mL with a mismatch of 126 mL. Scott Day was transferred to our service for emergency treatment of the right M1/MCA occlusion. EXAM: IR CT HEAD LIMITED; IR PERCUTANEOUS ART THORMBECTOMY/INFUSION INTRACRANIAL INCLUDE DIAG ANGIO; IR ULTRASOUND GUIDANCE VASC ACCESS RIGHT; INTRACRANIAL STENT (INCL PTA) ANESTHESIA/SEDATION: General anesthesia. MEDICATIONS: Intra arterial Verapamil 5 mg; intra arterial Integrilin 19 mg. CONTRAST:  5mL21mNIPAQUE IOHEXOL 300 MG/ML SOLN, 75mL67mIPAQUE IOHEXOL 240 MG/ML SOLN PROCEDURE: Operator: Dr. KatyuPedro Earls No healthcare proxy or next of kin available for informed consent. Discussion held with the neurohospitalist attending. Agreement reached that the procedure would be in the best interest of the patient and benefits outweighed risks. The patient was made to lie supine on the angiography table. Scott Day was prepped and draped utilizing the usual sterile technique. Using gray scale ultrasound-guided, a micropuncture kit and modified Seldinger technique, access was gained to the right common femoral artery. An 8 French femoral sheath was then placed. Under fluoroscopy, an 8 Frenc[place] balloon guide catheter was navigated over a 6 FrencPakistannstein 2 catheter and a 0.038 inch Terumo Glidewire into the aortic arch. Under fluoroscopy, the catheter was advanced into the right common carotid artery and then into the right internal carotid artery. Frontal and lateral angiograms of the head were obtained showing a distal right M1/MCA occlusion with minimal delayed penetration of contrast. A large bore aspiration catheter was then navigated through the walrus balloon guide catheter and over a phenom 21 microcatheter and a synchro support microguidewire into the cavernous segment of the right ICA. The microcatheter was then advanced over the microwire into a right M1/MCA middle division branch. A 6 mm solitaire stent retriever was deployed  spanning  the right M1/MCA and proximal M2/middle division branch. Device was allowed to intercalated with the clot for 4 minutes. The microcatheter was removed under fluoroscopy. The guiding catheter balloon was inflated at the level of the proximal petrous segment. Thrombectomy device and aspiration catheter were then removed under constant aspiration. Follow-up angiogram showed recanalization of the M1 segment with subocclusive filling defect and slow flow in distal MCA branches (TICI 2C). A large bore aspiration catheter was again navigated through the walrus balloon guide catheter and over a phenom 21 microcatheter and a synchro support microguidewire into the cavernous segment of the right ICA. The microcatheter was then advanced over the microwire into a right M1/MCA posterior division branch. A 6 mm solitaire stent retriever was deployed spanning the right M1/MCA and proximal M2 posterior division branch. Device was allowed to intercalated with the clot for 4 minutes. The microcatheter was removed under fluoroscopy. The guiding catheter balloon was inflated at the level of the proximal petrous segment. Thrombectomy device and aspiration catheter were then removed under constant aspiration. Follow-up angiogram show normal forward flow into the right MCA vascular territory noting a small dissection flap in the mid/distal right M1/MCA segment. Delayed follow-up angiogram showed progressive reocclusion of the right M1/MCA with minimal forward flow. Flat panel CT of the head was obtained and post processed in a separate workstation under concurrent attending physician supervision. No evidence of hemorrhagic complication noted. Right ICA angiograms with frontal lateral views of the head were obtained. Further progression of occlusion in the right M1 segment was noted. Small non flow limiting iatrogenic dissection in the upper cervical segment of the right ICA identified. The large bore aspiration catheter was navigated  through the walrus balloon guide catheter and over a SL 10 microcatheter and a synchro support microguidewire into the cavernous segment of the right ICA. The microcatheter was then advanced over the microwire into a right M1/MCA posterior division branch. Intra arterial infusion of 19 mg of Integrilin was performed into the right ICA. Subsequently, a 3 mm x 15 mm atlas intracranial stent was deployed in the M1 segment across the dissection flap. Follow-up angiogram showed adequate stent positioning with complete restoration of the anterograde flow in the right MCA. Delayed follow-up angiogram (x2) show no evidence of reocclusion. 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 Postprocedural flat panel CT of the head was obtained and post processed in a separate workstation under concurrent attending physician supervision. No evidence of hemorrhagic complication noted. The catheter system was withdrawn. A right common femoral artery angiogram was obtained showing access at the level of the right common femoral artery. The femoral sheath was removed and the access was closed with a Perclose ProGlide. Immediate hemostasis achieved. 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 FINDINGS: Dissection of the mid/distal right M1/MCA. IMPRESSION: 1. Mechanical thrombectomy performed (x2) with temporary restoration of flow revealing dissection flap in the mid/distal right M1/MCA segment. 2. Intracranial stenting placed in the right M1/MCA across the dissection flap with complete restoration of flow. 3. No hemorrhagic complication on postprocedural flat panel CT. No embolus to new territory. PLAN: Patient is to remain on Integrilin drip. A follow-up head CT will be obtained at 6:30 p.m. today. At this point, patient will be re-evaluated for transition to oral dual anti-platelet therapy. Electronically Signed   By: Pedro Earls M.D.   On: 06/04/2019 17:40   IR  CT Head Ltd  Result Date: 06/04/2019 CLINICAL DATA:  59 year old male with past medical history significant for colon cancer in 2012. Scott Day developed left-sided weakness and neglect on awakening this morning. Scott Day was [facility] to [facility]. Head CT showed small hypodensity in the right insula with no hemorrhage. NIHSS 10. CT angiogram showed tapering of the right M1/MCA with short-segment filling defect. CT perfusion showed an estimated core volume of 25 mL with a mismatch of 126 mL. Scott Day was transferred to our service for emergency treatment of the right M1/MCA occlusion. EXAM: IR CT HEAD LIMITED; IR PERCUTANEOUS ART THORMBECTOMY/INFUSION INTRACRANIAL INCLUDE DIAG ANGIO; IR ULTRASOUND GUIDANCE VASC ACCESS RIGHT; INTRACRANIAL STENT (INCL PTA) ANESTHESIA/SEDATION: General anesthesia. MEDICATIONS: Intra arterial Verapamil 5 mg; intra arterial Integrilin 19 mg. CONTRAST:  47m OMNIPAQUE IOHEXOL 300 MG/ML SOLN, 773mOMNIPAQUE IOHEXOL 240 MG/ML SOLN PROCEDURE: Operator: Dr. KaPedro EarlsMD. No healthcare proxy or next of kin available for informed consent. Discussion held with the neurohospitalist attending. Agreement reached that the procedure would be in the best interest of the patient and benefits outweighed risks. The patient was made to lie supine on the angiography table. Scott Day was prepped and draped utilizing the usual sterile technique. Using gray scale ultrasound-guided, a micropuncture kit and modified Seldinger technique, access was gained to the right common femoral artery. An 8 French femoral sheath was then placed. Under fluoroscopy, an 8 FrPak[place]alrus balloon guide catheter was navigated over a 6 FrPakistanerenstein 2 catheter and a 0.038 inch Terumo Glidewire into the aortic arch. Under fluoroscopy, the catheter was advanced into the right common carotid artery and then into the right internal carotid artery. Frontal and lateral  angiograms of the head were obtained showing a distal right M1/MCA occlusion with minimal delayed penetration of contrast. A large bore aspiration catheter was then navigated through the walrus balloon guide catheter and over a phenom 21 microcatheter and a synchro support microguidewire into the cavernous segment of the right ICA. The microcatheter was then advanced over the microwire into a right M1/MCA middle division branch. A 6 mm solitaire stent retriever was deployed spanning the right M1/MCA and proximal M2/middle division branch. Device was allowed to intercalated with the clot for 4 minutes. The microcatheter was removed under fluoroscopy. The guiding catheter balloon was inflated at the level of the proximal petrous segment. Thrombectomy device and aspiration catheter were then removed under constant aspiration. Follow-up angiogram showed recanalization of the M1 segment with subocclusive filling defect and slow flow in distal MCA branches (TICI 2C). A large bore aspiration catheter was again navigated through the walrus balloon guide catheter and over a phenom 21 microcatheter and a synchro support microguidewire into the cavernous segment of the right ICA. The microcatheter was then advanced over the microwire into a right M1/MCA posterior division branch. A 6 mm solitaire stent retriever was deployed spanning the right M1/MCA and proximal M2 posterior division branch. Device was allowed to intercalated with the clot for 4 minutes. The microcatheter was removed under fluoroscopy. The guiding catheter balloon was inflated at the level of the proximal petrous segment. Thrombectomy device and aspiration catheter were then removed under constant aspiration. Follow-up angiogram show normal forward flow into the right MCA vascular territory noting a small dissection flap in the mid/distal right M1/MCA segment. Delayed follow-up angiogram showed progressive reocclusion of the right M1/MCA with minimal forward  flow. Flat panel CT of the head was obtained and post processed in a separate workstation under concurrent attending  physician supervision. No evidence of hemorrhagic complication noted. Right ICA angiograms with frontal lateral views of the head were obtained. Further progression of occlusion in the right M1 segment was noted. Small non flow limiting iatrogenic dissection in the upper cervical segment of the right ICA identified. The large bore aspiration catheter was navigated through the walrus balloon guide catheter and over a SL 10 microcatheter and a synchro support microguidewire into the cavernous segment of the right ICA. The microcatheter was then advanced over the microwire into a right M1/MCA posterior division branch. Intra arterial infusion of 19 mg of Integrilin was performed into the right ICA. Subsequently, a 3 mm x 15 mm atlas intracranial stent was deployed in the M1 segment across the dissection flap. Follow-up angiogram showed adequate stent positioning with complete restoration of the anterograde flow in the right MCA. Delayed follow-up angiogram (x2) show no evidence of reocclusion. 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 Postprocedural flat panel CT of the head was obtained and post processed in a separate workstation under concurrent attending physician supervision. No evidence of hemorrhagic complication noted. The catheter system was withdrawn. A right common femoral artery angiogram was obtained showing access at the level of the right common femoral artery. The femoral sheath was removed and the access was closed with a Perclose ProGlide. Immediate hemostasis achieved. 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 FINDINGS: Dissection of the mid/distal right M1/MCA. IMPRESSION: 1. Mechanical thrombectomy performed (x2) with temporary restoration of flow revealing dissection flap  in the mid/distal right M1/MCA segment. 2. Intracranial stenting placed in the right M1/MCA across the dissection flap with complete restoration of flow. 3. No hemorrhagic complication on postprocedural flat panel CT. No embolus to new territory. PLAN: Patient is to remain on Integrilin drip. A follow-up head CT will be obtained at 6:30 p.m. today. At this point, patient will be re-evaluated for transition to oral dual anti-platelet therapy. Electronically Signed   By: Pedro Earls M.D.   On: 06/04/2019 17:40   IR CT Head Ltd  Result Date: 06/04/2019 CLINICAL DATA:  59 year old male with past medical history significant for colon cancer in 2012. [place] [facility] developed left-sided weakness and neglect on awakening this morning. [facility] was taken to [facility]. Head CT showed small hypodensity in the right insula with no hemorrhage. NIHSS 10. CT angiogram showed tapering of the right M1/MCA with short-segment filling defect. CT perfusion showed an estimated core volume of 25 mL with a mismatch of 126 mL. [place] [facility] was transferred to our service for emergency treatment of the right M1/MCA occlusion. EXAM: IR CT HEAD LIMITED; IR PERCUTANEOUS ART THORMBECTOMY/INFUSION INTRACRANIAL INCLUDE DIAG ANGIO; IR ULTRASOUND GUIDANCE VASC ACCESS RIGHT; INTRACRANIAL STENT (INCL PTA) ANESTHESIA/SEDATION: General anesthesia. MEDICATIONS: Intra arterial Verapamil 5 mg; intra arterial Integrilin 19 mg. CONTRAST:  34m OMNIPAQUE IOHEXOL 300 MG/ML SOLN, 741mOMNIPAQUE IOHEXOL 240 MG/ML SOLN PROCEDURE: Operator: Dr. KaPedro EarlsMD. No healthcare proxy or next of kin available for informed consent. Discussion held with the neurohospitalist attending. Agreement reached that the procedure would be in the best interest of the patient and benefits outweighed risks. The patient was made to lie supine on the angiography table. [place] [facility] was prepped and draped utilizing the usual sterile technique. Using gray scale  ultrasound-guided, a micropuncture kit and modified Seldinger technique, access was gained to the right common femoral artery. An 8 French femoral sheath was  then placed. Under fluoroscopy, an 8 [place] Walrus balloon guide catheter was navigated over a 6 [place] Berenstein 2 catheter and a 0.038 inch Terumo Glidewire into the aortic arch. Under fluoroscopy, the catheter was advanced into the right common carotid artery and then into the right internal carotid artery. Frontal and lateral angiograms of the head were obtained showing a distal right M1/MCA occlusion with minimal delayed penetration of contrast. A large bore aspiration catheter was then navigated through the walrus balloon guide catheter and over a phenom 21 microcatheter and a synchro support microguidewire into the cavernous segment of the right ICA. The microcatheter was then advanced over the microwire into a right M1/MCA middle division branch. A 6 mm solitaire stent retriever was deployed spanning the right M1/MCA and proximal M2/middle division branch. Device was allowed to intercalated with the clot for 4 minutes. The microcatheter was removed under fluoroscopy. The guiding catheter balloon was inflated at the level of the proximal petrous segment. Thrombectomy device and aspiration catheter were then removed under constant aspiration. Follow-up angiogram showed recanalization of the M1 segment with subocclusive filling defect and slow flow in distal MCA branches (TICI 2C). A large bore aspiration catheter was again navigated through the walrus balloon guide catheter and over a phenom 21 microcatheter and a synchro support microguidewire into the cavernous segment of the right ICA. The microcatheter was then advanced over the microwire into a right M1/MCA posterior division branch. A 6 mm solitaire stent retriever was deployed spanning the right M1/MCA and proximal M2 posterior division branch. Device was allowed to intercalated with the clot for  4 minutes. The microcatheter was removed under fluoroscopy. The guiding catheter balloon was inflated at the level of the proximal petrous segment. Thrombectomy device and aspiration catheter were then removed under constant aspiration. Follow-up angiogram show normal forward flow into the right MCA vascular territory noting a small dissection flap in the mid/distal right M1/MCA segment. Delayed follow-up angiogram showed progressive reocclusion of the right M1/MCA with minimal forward flow. Flat panel CT of the head was obtained and post processed in a separate workstation under concurrent attending physician supervision. No evidence of hemorrhagic complication noted. Right ICA angiograms with frontal lateral views of the head were obtained. Further progression of occlusion in the right M1 segment was noted. Small non flow limiting iatrogenic dissection in the upper cervical segment of the right ICA identified. The large bore aspiration catheter was navigated through the walrus balloon guide catheter and over a SL 10 microcatheter and a synchro support microguidewire into the cavernous segment of the right ICA. The microcatheter was then advanced over the microwire into a right M1/MCA posterior division branch. Intra arterial infusion of 19 mg of Integrilin was performed into the right ICA. Subsequently, a 3 mm x 15 mm atlas intracranial stent was deployed in the M1 segment across the dissection flap. Follow-up angiogram showed adequate stent positioning with complete restoration of the anterograde flow in the right MCA. Delayed follow-up angiogram (x2) show no evidence of reocclusion. 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 Postprocedural flat panel CT of the head was obtained and post processed in a separate workstation under concurrent attending physician supervision. No evidence of hemorrhagic complication noted. The catheter system was withdrawn. A  right common femoral artery angiogram was obtained showing access at the level of the right common femoral artery. The femoral sheath was removed and the access was closed with a Perclose ProGlide. Immediate hemostasis  achieved. 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 FINDINGS: Dissection of the mid/distal right M1/MCA. IMPRESSION: 1. Mechanical thrombectomy performed (x2) with temporary restoration of flow revealing dissection flap in the mid/distal right M1/MCA segment. 2. Intracranial stenting placed in the right M1/MCA across the dissection flap with complete restoration of flow. 3. No hemorrhagic complication on postprocedural flat panel CT. No embolus to new territory. PLAN: Patient is to remain on Integrilin drip. A follow-up head CT will be obtained at 6:30 p.m. today. At this point, patient will be re-evaluated for transition to oral dual anti-platelet therapy. Electronically Signed   By: Pedro Earls M.D.   On: 06/04/2019 17:40   IR US Guide Vasc Access Right  Result Date: 06/04/2019 CLINICAL DATA:  59 year old male with past medical history significant for colon cancer in 2012. [place] Day developed left-sided weakness and neglect on awakening this morning. Scott Day was taken to [facility] Day. Head CT showed small hypodensity in the right insula with no hemorrhage. NIHSS 10. CT angiogram showed tapering of the right M1/MCA with short-segment filling defect. CT perfusion showed an estimated core volume of 25 mL with a mismatch of 126 mL. [place] Day was transferred to our service for emergency treatment of the right M1/MCA occlusion. EXAM: IR CT HEAD LIMITED; IR PERCUTANEOUS ART THORMBECTOMY/INFUSION INTRACRANIAL INCLUDE DIAG ANGIO; IR ULTRASOUND GUIDANCE VASC ACCESS RIGHT; INTRACRANIAL STENT (INCL PTA) ANESTHESIA/SEDATION: General anesthesia. MEDICATIONS: Intra arterial Verapamil 5 mg; intra arterial Integrilin 19 mg. CONTRAST:  92m  OMNIPAQUE IOHEXOL 300 MG/ML SOLN, 781mOMNIPAQUE IOHEXOL 240 MG/ML SOLN PROCEDURE: Operator: Dr. KaPedro EarlsMD. No healthcare proxy or next of kin available for informed consent. Discussion held with the neurohospitalist attending. Agreement reached that the procedure would be in the best interest of the patient and benefits outweighed risks. The patient was made to lie supine on the angiography table. Scott Day was prepped and draped utilizing the usual sterile technique. Using gray scale ultrasound-guided, a micropuncture kit and modified Seldinger technique, access was gained to the right common femoral artery. An 8 French femoral sheath was then placed. Under fluoroscopy, an 8 [place] balloon guide catheter was navigated over a 6 [place]stein 2 catheter and a 0.038 inch Terumo Glidewire into the aortic arch. Under fluoroscopy, the catheter was advanced into the right common carotid artery and then into the right internal carotid artery. Frontal and lateral angiograms of the head were obtained showing a distal right M1/MCA occlusion with minimal delayed penetration of contrast. A large bore aspiration catheter was then navigated through the walrus balloon guide catheter and over a phenom 21 microcatheter and a synchro support microguidewire into the cavernous segment of the right ICA. The microcatheter was then advanced over the microwire into a right M1/MCA middle division branch. A 6 mm solitaire stent retriever was deployed spanning the right M1/MCA and proximal M2/middle division branch. Device was allowed to intercalated with the clot for 4 minutes. The microcatheter was removed under fluoroscopy. The guiding catheter balloon was inflated at the level of the proximal petrous segment. Thrombectomy device and aspiration catheter were then removed under constant aspiration. Follow-up angiogram showed recanalization of the M1 segment with subocclusive filling defect and slow flow in distal  MCA branches (TICI 2C). A large bore aspiration catheter was again navigated through the walrus balloon guide catheter and over a phenom 21 microcatheter and a synchro support microguidewire into the cavernous segment of the right ICA. The microcatheter  was then advanced over the microwire into a right M1/MCA posterior division branch. A 6 mm solitaire stent retriever was deployed spanning the right M1/MCA and proximal M2 posterior division branch. Device was allowed to intercalated with the clot for 4 minutes. The microcatheter was removed under fluoroscopy. The guiding catheter balloon was inflated at the level of the proximal petrous segment. Thrombectomy device and aspiration catheter were then removed under constant aspiration. Follow-up angiogram show normal forward flow into the right MCA vascular territory noting a small dissection flap in the mid/distal right M1/MCA segment. Delayed follow-up angiogram showed progressive reocclusion of the right M1/MCA with minimal forward flow. Flat panel CT of the head was obtained and post processed in a separate workstation under concurrent attending physician supervision. No evidence of hemorrhagic complication noted. Right ICA angiograms with frontal lateral views of the head were obtained. Further progression of occlusion in the right M1 segment was noted. Small non flow limiting iatrogenic dissection in the upper cervical segment of the right ICA identified. The large bore aspiration catheter was navigated through the walrus balloon guide catheter and over a SL 10 microcatheter and a synchro support microguidewire into the cavernous segment of the right ICA. The microcatheter was then advanced over the microwire into a right M1/MCA posterior division branch. Intra arterial infusion of 19 mg of Integrilin was performed into the right ICA. Subsequently, a 3 mm x 15 mm atlas intracranial stent was deployed in the M1 segment across the dissection flap. Follow-up  angiogram showed adequate stent positioning with complete restoration of the anterograde flow in the right MCA. Delayed follow-up angiogram (x2) show no evidence of reocclusion. 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 Postprocedural flat panel CT of the head was obtained and post processed in a separate workstation under concurrent attending physician supervision. No evidence of hemorrhagic complication noted. The catheter system was withdrawn. A right common femoral artery angiogram was obtained showing access at the level of the right common femoral artery. The femoral sheath was removed and the access was closed with a Perclose ProGlide. Immediate hemostasis achieved. 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 FINDINGS: Dissection of the mid/distal right M1/MCA. IMPRESSION: 1. Mechanical thrombectomy performed (x2) with temporary restoration of flow revealing dissection flap in the mid/distal right M1/MCA segment. 2. Intracranial stenting placed in the right M1/MCA across the dissection flap with complete restoration of flow. 3. No hemorrhagic complication on postprocedural flat panel CT. No embolus to new territory. PLAN: Patient is to remain on Integrilin drip. A follow-up head CT will be obtained at 6:30 p.m. today. At this point, patient will be re-evaluated for transition to oral dual anti-platelet therapy. Electronically Signed   By: Pedro Earls M.D.   On: 06/04/2019 17:40   CT CEREBRAL PERFUSION W CONTRAST  Addendum Date: 06/04/2019   ADDENDUM REPORT: 06/04/2019 12:21 ADDENDUM: Contrast dose is 100 mL Omnipaque 350 Electronically Signed   By: Macy Mis M.D.   On: 06/04/2019 12:21   Result Date: 06/04/2019 CLINICAL DATA:  Code stroke.  Slurred speech and left facial droop EXAM: CT ANGIOGRAPHY HEAD AND NECK CT PERFUSION BRAIN TECHNIQUE: Multidetector CT imaging of the  head and neck was performed using the standard protocol during bolus administration of intravenous contrast. Multiplanar CT image reconstructions and MIPs were obtained to evaluate the vascular anatomy. Carotid stenosis measurements (when applicable) are obtained utilizing NASCET criteria, using the  distal internal carotid diameter as the denominator. Multiphase CT imaging of the brain was performed following IV bolus contrast injection. Subsequent parametric perfusion maps were calculated using RAPID software. COMPARISON:  None. FINDINGS: CT HEAD Brain: There is no acute intracranial hemorrhage mass effect edema. Gray-white differentiation is preserved. Ventricles and sulci normal in size and configuration. There is no extra-axial fluid collection. Vascular: Hyperdense vessel. Skull: Unremarkable. Sinuses/Orbits: Lobular mucosal thickening with greatest involvement of the right maxillary sinus. No acute orbital finding. Other: Mastoid air cells are clear ASPECTS [facility] Stroke Program Early CT Score) - Ganglionic level infarction (caudate, lentiform nuclei, internal capsule, insula, M1-M3 cortex): 7 - Supraganglionic infarction (M4-M6 cortex): 3 Total score (0-10 with 10 being normal): 10 Review of the MIP images confirms the above findings CTA NECK Aortic arch: Great vessel origins are patent. Right carotid system: Patent. There is trace calcified plaque at the ICA origin measurable stenosis. Left carotid system: Patent. Eccentric noncalcified plaque causing less than 50% stenosis. Mild calcified plaque at the ICA origin causing less than 50% stenosis. Vertebral arteries: Patent.  Left vertebral artery dominant. Skeleton: Cervical spine degenerative changes. Other neck: No mass or adenopathy Upper chest: No apical lung mass. Review of the MIP images confirms the above findings CTA HEAD Anterior circulation: Intracranial internal carotid arteries patent. There is nonocclusive thrombus within distal right M1 MCA  extending to the bifurcation. Left middle cerebral artery is patent. Anterior cerebral arteries are patent. Congenitally absent right A1 ACA. Posterior circulation: Intracranial vertebral arteries, basilar artery, and posterior cerebral arteries are patent. There are bilateral patent posterior communicating arteries. Venous sinuses: As permitted by contrast timing, patent. Review of the MIP images confirms the above findings CT Brain Perfusion: CBF (<30%) Volume: 30m Perfusion (Tmax>6.0s) volume: 1543mMismatch Volume: 12611mnfarction Location: Right MCA territory IMPRESSION: No acute intracranial hemorrhage or evidence of acute infarction. ASPECT score is 10. 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 Perfusion imaging demonstrates 25 mL core infarction and 126 mL territory at risk in the right MCA territory. No hemodynamically significant stenosis in the neck. These results were called by telephone at the time of interpretation on 06/04/2019 at 11:53 am to provider JONScripps Green HospitalLESAlexis Goodellwho verbally acknowledged these results. Electronically Signed: By: PraMacy MisD. On: 06/04/2019 11:59   DG CHEST PORT 1 VIEW  Result Date: 06/05/2019 CLINICAL DATA:  NG tube placement. EXAM: PORTABLE CHEST 1 VIEW COMPARISON:  CT angiogram of the chest dated 09/13/2002 FINDINGS: NG tube tip is below the diaphragm. Heart size and pulmonary vascularity are normal. Lungs are clear. No significant bone abnormality. IMPRESSION: Normal chest.  NG tube tip below the diaphragm. Electronically Signed   By: JamLorriane ShireD.   On: 06/05/2019 14:29   DG Abd Portable 1V  Result Date: 06/05/2019 CLINICAL DATA:  Nasogastric tube placement. EXAM: PORTABLE ABDOMEN - 1 VIEW COMPARISON:  Same day. FINDINGS: The bowel gas pattern is normal. Distal tip of nasogastric tube is seen in expected position of the gastroesophageal junction. No radio-opaque calculi or other significant  radiographic abnormality are seen. IMPRESSION: Distal tip of nasogastric tube seen in expected position of gastroesophageal junction. Proximal side hole is seen in distal esophagus. Advancement is recommended. Electronically Signed   By: JamMarijo ConceptionD.   On: 06/05/2019 12:01   DG Abd Portable 1V  Result Date: 06/05/2019 CLINICAL DATA:  Nasogastric tube placement EXAM: PORTABLE ABDOMEN - 1 VIEW COMPARISON:  Portable exam  1050 hours compared to 0858 hours FINDINGS: Tip of nasogastric tube projects over stomach though the proximal side-port projects over the distal esophagus; recommend advancing tube 5 cm. Nonobstructive bowel gas pattern. No bowel dilatation or bowel wall thickening. Osseous structures unremarkable. IMPRESSION: Proximal side-port of nasogastric tube projects over distal esophagus; recommend advancing tube 5 cm. Electronically Signed   By: Lavonia Dana M.D.   On: 06/05/2019 11:00   DG Abd Portable 1V  Result Date: 06/05/2019 CLINICAL DATA:  NG tube placement EXAM: PORTABLE ABDOMEN - 1 VIEW COMPARISON:  06/04/2019 FINDINGS: Esophagogastric tube remains with tip below the diaphragm and side port above the gastroesophageal junction. General paucity of bowel although scattered gas is gas present to the rectum. No free air on supine radiographs. IMPRESSION: Esophagogastric tube remains with tip below the diaphragm and side port above the gastroesophageal junction. Recommend advancement to ensure subdiaphragmatic positioning of tip and side port. Electronically Signed   By: Eddie Candle M.D.   On: 06/05/2019 09:25   DG Swallowing Func-Speech Pathology  Result Date: 06/06/2019 Objective Swallowing Evaluation: Type of Study: MBS-Modified Barium Swallow Study  Patient Details Name: Scott Day MRN: [id number] Date of Birth: November 19, 1960 Today's Date: 06/06/2019 Time: SLP Start Time (ACUTE ONLY): 4098 -SLP Stop Time (ACUTE ONLY): 1416 SLP Time Calculation (Day) (ACUTE ONLY): 19 Day Past Medical  History: Past Medical History: Diagnosis Date . Cancer [facility]) 2012  colon cancer . Closed fracture of left distal radius  . GERD (gastroesophageal reflux disease)  Past Surgical History: Past Surgical History: Procedure Laterality Date . COLON SURGERY  2012  colon cancer . IR CT HEAD LTD  06/04/2019 . IR CT HEAD LTD  06/04/2019 . IR INTRA CRAN STENT  06/04/2019 . IR PERCUTANEOUS ART THROMBECTOMY/INFUSION INTRACRANIAL INC DIAG ANGIO  06/04/2019 . IR US GUIDE VASC ACCESS RIGHT  06/04/2019 . OPEN REDUCTION INTERNAL FIXATION (ORIF) DISTAL RADIAL FRACTURE Left 09/20/2017  Procedure: OPEN REDUCTION INTERNAL FIXATION (ORIF) DISTAL RADIAL FRACTURE;  Surgeon: Leanora Cover, MD;  Location: [place];  Service: Orthopedics;  Laterality: Left; . RADIOLOGY WITH ANESTHESIA N/A 06/04/2019  Procedure: IR WITH ANESTHESIA;  Surgeon: Radiologist, Medication, MD;  Location: [place];  Service: Radiology;  Laterality: N/A; HPI: Scott Day is a 59 y.o. male with a history of colon cancer in 2012 and GERD, who presents with left-sided weakness. 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  Subjective: cooperative Assessment / Plan / Recommendation CHL IP CLINICAL IMPRESSIONS 06/06/2019 Clinical Impression Patient presents with mild oropharyngeal dysphagia. Oral phase is remarkable for prolonged mastication and lingual residue. Pharyngeal phase is remarkable for reduced epiglottic inversion that leads to reduced laryngeal closure, resulting in penetration (PAS 4 and 5) and vallecular residue. Pt noted with throat clearing during the study following penetration, and was able to clear penetrate the majority of the time. Prior to administering POs, pt was also noted with grunting and throat clearing. Given the prolonged transit with regular solids d/t missing dentention, recommend Dys 3 and thin liquids, and meds whole in puree. Ensure the pt is following general aspiration precautions (small  bites/sips, upright while eating/drinking).  SLP Visit Diagnosis Dysphagia, oropharyngeal phase (R13.12) Attention and concentration deficit following -- Frontal lobe and executive function deficit following -- Impact on safety and function Mild aspiration risk   CHL IP TREATMENT RECOMMENDATION 06/06/2019 Treatment Recommendations Therapy as outlined in treatment plan below   Prognosis 06/06/2019 Prognosis for Safe Diet Advancement Good Barriers to Reach  Goals Cognitive deficits Barriers/Prognosis Comment -- CHL IP DIET RECOMMENDATION 06/06/2019 SLP Diet Recommendations Dysphagia 3 (Mech soft) solids;Thin liquid Liquid Administration via Straw;Cup;Spoon Medication Administration Whole meds with puree Compensations Minimize environmental distractions;Slow rate;Small sips/bites Postural Changes Seated upright at 90 degrees   CHL IP OTHER RECOMMENDATIONS 06/06/2019 Recommended Consults -- Oral Care Recommendations Oral care BID Other Recommendations Have oral suction available   CHL IP FOLLOW UP RECOMMENDATIONS 06/06/2019 Follow up Recommendations Inpatient Rehab   CHL IP FREQUENCY AND DURATION 06/06/2019 Speech Therapy Frequency (ACUTE ONLY) Day 2x/week Treatment Duration 2 weeks      CHL IP ORAL PHASE 06/06/2019 Oral Phase Impaired Oral - Pudding Teaspoon -- Oral - Pudding Cup -- Oral - Honey Teaspoon -- Oral - Honey Cup -- Oral - Nectar Teaspoon -- Oral - Nectar Cup -- Oral - Nectar Straw -- Oral - Thin Teaspoon WFL Oral - Thin Cup Lingual/palatal residue Oral - Thin Straw Lingual/palatal residue Oral - Puree Lingual/palatal residue Oral - Mech Soft -- Oral - Regular Impaired mastication Oral - Multi-Consistency -- Oral - Pill WFL Oral Phase - Comment --  CHL IP PHARYNGEAL PHASE 06/06/2019 Pharyngeal Phase Impaired Pharyngeal- Pudding Teaspoon -- Pharyngeal -- Pharyngeal- Pudding Cup -- Pharyngeal -- Pharyngeal- Honey Teaspoon -- Pharyngeal -- Pharyngeal- Honey Cup -- Pharyngeal -- Pharyngeal- Nectar Teaspoon -- Pharyngeal  -- Pharyngeal- Nectar Cup -- Pharyngeal -- Pharyngeal- Nectar Straw -- Pharyngeal -- Pharyngeal- Thin Teaspoon WFL Pharyngeal -- Pharyngeal- Thin Cup Penetration/Aspiration during swallow;Reduced airway/laryngeal closure Pharyngeal Material enters airway, CONTACTS cords and then ejected out Pharyngeal- Thin Straw Reduced airway/laryngeal closure;Penetration/Aspiration during swallow Pharyngeal Material enters airway, CONTACTS cords and then ejected out;Material enters airway, CONTACTS cords and not ejected out Pharyngeal- Puree Pharyngeal residue - valleculae Pharyngeal -- Pharyngeal- Mechanical Soft -- Pharyngeal -- Pharyngeal- Regular Pharyngeal residue - valleculae Pharyngeal -- Pharyngeal- Multi-consistency -- Pharyngeal -- Pharyngeal- Pill WFL Pharyngeal -- Pharyngeal Comment --  CHL IP CERVICAL ESOPHAGEAL PHASE 06/06/2019 Cervical Esophageal Phase WFL Pudding Teaspoon -- Pudding Cup -- Honey Teaspoon -- Honey Cup -- Nectar Teaspoon -- Nectar Cup -- Nectar Straw -- Thin Teaspoon -- Thin Cup -- Thin Straw -- Puree -- Mechanical Soft -- Regular -- Multi-consistency -- Pill -- Cervical Esophageal Comment -- Osie Bond., M.A. CCC-SLP Acute Rehabilitation Services Pager [phone] Office [phone] 06/06/2019, 3:14 PM              ECHOCARDIOGRAM COMPLETE  Result Date: 06/05/2019    ECHOCARDIOGRAM REPORT   Patient Name:   Scott Day Date of Exam: 06/05/2019 Medical Rec #:  [id number]      Height:       71.0 in Accession #:    [id number]     Weight:       231.5 lb Date of Birth:  August 27, 1960      BSA:          2.244 m Patient Age:    69 years       BP:           126/67 mmHg Patient Gender: M              HR:           70 bpm. Exam Location:  Inpatient Procedure: 2D Echo, Cardiac Doppler and Color Doppler Indications:    Stroke 434.91/I163.9  History:        Patient has no prior history of Echocardiogram examinations.  Risk Factors:Former Smoker. CVA.  Sonographer:    Clayton Lefort RDCS (AE) Referring  Phys: [phone] MCNEILL P [place]  1. Left ventricular ejection fraction, by estimation, is 65 to 70%. The left ventricle has normal function. The left ventricle has no regional wall motion abnormalities. There is severe left ventricular hypertrophy. Left ventricular diastolic parameters  were normal.  2. Right ventricular systolic function is normal. The right ventricular size is normal.  3. The mitral valve is abnormal. Trivial mitral valve regurgitation.  4. The aortic valve is tricuspid. Aortic valve regurgitation is not visualized. No aortic stenosis is present.  5. The inferior vena cava is normal in size with <50% respiratory variability, suggesting right atrial pressure of 8 mmHg. FINDINGS  Left Ventricle: Left ventricular ejection fraction, by estimation, is 65 to 70%. The left ventricle has normal function. The left ventricle has no regional wall motion abnormalities. The left ventricular internal cavity size was normal in size. There is  severe left ventricular hypertrophy. Left ventricular diastolic parameters were normal. Right Ventricle: The right ventricular size is normal. No increase in right ventricular wall thickness. Right ventricular systolic function is normal. Left Atrium: Left atrial size was normal in size. Right Atrium: Right atrial size was normal in size. Pericardium: There is no evidence of pericardial effusion. Mitral Valve: The mitral valve is abnormal. There is mild thickening of the mitral valve leaflet(s). Trivial mitral valve regurgitation. MV peak gradient, 3.6 mmHg. The mean mitral valve gradient is 1.0 mmHg. Tricuspid Valve: The tricuspid valve is grossly normal. Tricuspid valve regurgitation is trivial. Aortic Valve: The aortic valve is tricuspid. Aortic valve regurgitation is not visualized. No aortic stenosis is present. Pulmonic Valve: The pulmonic valve was grossly normal. Pulmonic valve regurgitation is not visualized. Aorta: The aortic root, ascending aorta,  aortic arch and descending aorta are all structurally normal, with no evidence of dilitation or obstruction. Venous: The inferior vena cava is normal in size with less than 50% respiratory variability, suggesting right atrial pressure of 8 mmHg. IAS/Shunts: The interatrial septum was not well visualized.  LEFT VENTRICLE PLAX 2D LVIDd:         4.02 cm  Diastology LVIDs:         2.40 cm  LV e' lateral:   10.40 cm/s LV PW:         1.68 cm  LV E/e' lateral: 9.0 LV IVS:        1.71 cm  LV e' medial:    9.79 cm/s LVOT diam:     2.10 cm  LV E/e' medial:  9.6 LV SV:         80 LV SV Index:   36 LVOT Area:     3.46 cm  RIGHT VENTRICLE             IVC RV Basal diam:  2.29 cm     IVC diam: 1.86 cm RV S prime:     14.10 cm/s TAPSE (M-mode): 2.7 cm LEFT ATRIUM             Index       RIGHT ATRIUM           Index LA diam:        2.70 cm 1.20 cm/m  RA Area:     11.90 cm LA Vol (A2C):   52.4 ml 23.35 ml/m RA Volume:   23.60 ml  10.52 ml/m LA Vol (A4C):   40.7 ml 18.14 ml/m LA Biplane Vol: 47.6 ml 21.21  ml/m  AORTIC VALVE AV Area (Vmean):   3.20 cm AV Area (VTI):     3.24 cm AV Vmean:          88.200 cm/s AV VTI:            0.246 m LVOT Vmax:         118.00 cm/s LVOT Vmean:        81.500 cm/s LVOT VTI:          0.230 m LVOT/AV VTI ratio: 0.93  AORTA Ao Root diam: 3.40 cm Ao Asc diam:  3.60 cm MITRAL VALVE MV Area (PHT): 3.27 cm    SHUNTS MV Peak grad:  3.6 mmHg    Systemic VTI:  0.23 m MV Mean grad:  1.0 mmHg    Systemic Diam: 2.10 cm MV Vmax:       0.94 m/s MV Vmean:      56.1 cm/s MV Decel Time: 232 msec MV E velocity: 94.10 cm/s MV A velocity: 80.00 cm/s MV E/A ratio:  1.18 Lyman Bishop MD Electronically signed by Lyman Bishop MD Signature Date/Time: 06/05/2019/11:23:20 AM    Final    IR PERCUTANEOUS ART THROMBECTOMY/INFUSION INTRACRANIAL INC DIAG ANGIO  Result Date: 06/04/2019 CLINICAL DATA:  59 year old male with past medical history significant for colon cancer in 2012. Scott Day developed left-sided weakness and  neglect on awakening this morning. Scott Day was taken to [facility] Day. Head CT showed small hypodensity in the right insula with no hemorrhage. NIHSS 10. CT angiogram showed tapering of the right M1/MCA with short-segment filling defect. CT perfusion showed an estimated core volume of 25 mL with a mismatch of 126 mL. Scott Day was transferred to our service for emergency treatment of the right M1/MCA occlusion. EXAM: IR CT HEAD LIMITED; IR PERCUTANEOUS ART THORMBECTOMY/INFUSION INTRACRANIAL INCLUDE DIAG ANGIO; IR ULTRASOUND GUIDANCE VASC ACCESS RIGHT; INTRACRANIAL STENT (INCL PTA) ANESTHESIA/SEDATION: General anesthesia. MEDICATIONS: Intra arterial Verapamil 5 mg; intra arterial Integrilin 19 mg. CONTRAST:  89m OMNIPAQUE IOHEXOL 300 MG/ML SOLN, 789mOMNIPAQUE IOHEXOL 240 MG/ML SOLN PROCEDURE: Operator: Dr. KaPedro EarlsMD. No healthcare proxy or next of kin available for informed consent. Discussion held with the neurohospitalist attending. Agreement reached that the procedure would be in the best interest of the patient and benefits outweighed risks. The patient was made to lie supine on the angiography table. Scott Day was prepped and draped utilizing the usual sterile technique. Using gray scale ultrasound-guided, a micropuncture kit and modified Seldinger technique, access was gained to the right common femoral artery. An 8 French femoral sheath was then placed. Under fluoroscopy, an 8 FrPak[place]alrus balloon guide catheter was navigated over a 6 FrPakistanerenstein 2 catheter and a 0.038 inch Terumo Glidewire into the aortic arch. Under fluoroscopy, the catheter was advanced into the right common carotid artery and then into the right internal carotid artery. Frontal and lateral angiograms of the head were obtained showing a distal right M1/MCA occlusion with minimal delayed penetration of contrast. A large bore aspiration catheter was then navigated through the walrus balloon guide catheter and over a  phenom 21 microcatheter and a synchro support microguidewire into the cavernous segment of the right ICA. The microcatheter was then advanced over the microwire into a right M1/MCA middle division branch. A 6 mm solitaire stent retriever was deployed spanning the right M1/MCA and proximal M2/middle division branch. Device was allowed to intercalated with the clot for 4 minutes. The microcatheter was removed under fluoroscopy. The guiding catheter balloon was inflated  at the level of the proximal petrous segment. Thrombectomy device and aspiration catheter were then removed under constant aspiration. Follow-up angiogram showed recanalization of the M1 segment with subocclusive filling defect and slow flow in distal MCA branches (TICI 2C). A large bore aspiration catheter was again navigated through the walrus balloon guide catheter and over a phenom 21 microcatheter and a synchro support microguidewire into the cavernous segment of the right ICA. The microcatheter was then advanced over the microwire into a right M1/MCA posterior division branch. A 6 mm solitaire stent retriever was deployed spanning the right M1/MCA and proximal M2 posterior division branch. Device was allowed to intercalated with the clot for 4 minutes. The microcatheter was removed under fluoroscopy. The guiding catheter balloon was inflated at the level of the proximal petrous segment. Thrombectomy device and aspiration catheter were then removed under constant aspiration. Follow-up angiogram show normal forward flow into the right MCA vascular territory noting a small dissection flap in the mid/distal right M1/MCA segment. Delayed follow-up angiogram showed progressive reocclusion of the right M1/MCA with minimal forward flow. Flat panel CT of the head was obtained and post processed in a separate workstation under concurrent attending physician supervision. No evidence of hemorrhagic complication noted. Right ICA angiograms with frontal lateral  views of the head were obtained. Further progression of occlusion in the right M1 segment was noted. Small non flow limiting iatrogenic dissection in the upper cervical segment of the right ICA identified. The large bore aspiration catheter was navigated through the walrus balloon guide catheter and over a SL 10 microcatheter and a synchro support microguidewire into the cavernous segment of the right ICA. The microcatheter was then advanced over the microwire into a right M1/MCA posterior division branch. Intra arterial infusion of 19 mg of Integrilin was performed into the right ICA. Subsequently, a 3 mm x 15 mm atlas intracranial stent was deployed in the M1 segment across the dissection flap. Follow-up angiogram showed adequate stent positioning with complete restoration of the anterograde flow in the right MCA. Delayed follow-up angiogram (x2) show no evidence of reocclusion. 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 Postprocedural flat panel CT of the head was obtained and post processed in a separate workstation under concurrent attending physician supervision. No evidence of hemorrhagic complication noted. The catheter system was withdrawn. A right common femoral artery angiogram was obtained showing access at the level of the right common femoral artery. The femoral sheath was removed and the access was closed with a Perclose ProGlide. Immediate hemostasis achieved. 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 FINDINGS: Dissection of the mid/distal right M1/MCA. IMPRESSION: 1. Mechanical thrombectomy performed (x2) with temporary restoration of flow revealing dissection flap in the mid/distal right M1/MCA segment. 2. Intracranial stenting placed in the right M1/MCA across the dissection flap with complete restoration of flow. 3. No hemorrhagic complication on postprocedural flat panel CT. No embolus  to new territory. PLAN: Patient is to remain on Integrilin drip. A follow-up head CT will be obtained at 6:30 p.m. today. At this point, patient will be re-evaluated for transition to oral dual anti-platelet therapy. Electronically Signed   By: Pedro Earls M.D.   On: 06/04/2019 17:40   CT HEAD CODE STROKE WO CONTRAST  Addendum Date: 06/04/2019   ADDENDUM REPORT: 06/04/2019 12:21 ADDENDUM: Contrast dose is 100 mL Omnipaque 350 Electronically Signed   By: Addison Lank.D.  On: 06/04/2019 12:21   Result Date: 06/04/2019 CLINICAL DATA:  Code stroke.  Slurred speech and left facial droop EXAM: CT ANGIOGRAPHY HEAD AND NECK CT PERFUSION BRAIN TECHNIQUE: Multidetector CT imaging of the head and neck was performed using the standard protocol during bolus administration of intravenous contrast. Multiplanar CT image reconstructions and MIPs were obtained to evaluate the vascular anatomy. Carotid stenosis measurements (when applicable) are obtained utilizing NASCET criteria, using the distal internal carotid diameter as the denominator. Multiphase CT imaging of the brain was performed following IV bolus contrast injection. Subsequent parametric perfusion maps were calculated using RAPID software. COMPARISON:  None. FINDINGS: CT HEAD Brain: There is no acute intracranial hemorrhage mass effect edema. Gray-white differentiation is preserved. Ventricles and sulci normal in size and configuration. There is no extra-axial fluid collection. Vascular: Hyperdense vessel. Skull: Unremarkable. Sinuses/Orbits: Lobular mucosal thickening with greatest involvement of the right maxillary sinus. No acute orbital finding. Other: Mastoid air cells are clear ASPECTS [facility] Stroke Program Early CT Score) - Ganglionic level infarction (caudate, lentiform nuclei, internal capsule, insula, M1-M3 cortex): 7 - Supraganglionic infarction (M4-M6 cortex): 3 Total score (0-10 with 10 being normal): 10 Review of the MIP images  confirms the above findings CTA NECK Aortic arch: Great vessel origins are patent. Right carotid system: Patent. There is trace calcified plaque at the ICA origin measurable stenosis. Left carotid system: Patent. Eccentric noncalcified plaque causing less than 50% stenosis. Mild calcified plaque at the ICA origin causing less than 50% stenosis. Vertebral arteries: Patent.  Left vertebral artery dominant. Skeleton: Cervical spine degenerative changes. Other neck: No mass or adenopathy Upper chest: No apical lung mass. Review of the MIP images confirms the above findings CTA HEAD Anterior circulation: Intracranial internal carotid arteries patent. There is nonocclusive thrombus within distal right M1 MCA extending to the bifurcation. Left middle cerebral artery is patent. Anterior cerebral arteries are patent. Congenitally absent right A1 ACA. Posterior circulation: Intracranial vertebral arteries, basilar artery, and posterior cerebral arteries are patent. There are bilateral patent posterior communicating arteries. Venous sinuses: As permitted by contrast timing, patent. Review of the MIP images confirms the above findings CT Brain Perfusion: CBF (<30%) Volume: 83m Perfusion (Tmax>6.0s) volume: 1583mMismatch Volume: 12638mnfarction Location: Right MCA territory IMPRESSION: No acute intracranial hemorrhage or evidence of acute infarction. ASPECT score is 10. 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 Perfusion imaging demonstrates 25 mL core infarction and 126 mL territory at risk in the right MCA territory. No hemodynamically significant stenosis in the neck. These results were called by telephone at the time of interpretation on 06/04/2019 at 11:53 am to provider JONAdvanced Vision Surgery Center LLCLESAlexis Goodellwho verbally acknowledged these results. Electronically Signed: By: PraMacy MisD. On: 06/04/2019 11:59   VAS US [place] EXTREMITY VENOUS (DVT)  Result Date: 06/06/2019   Lower Venous DVTStudy Indications: Stroke.  Comparison Study: No prior study on file Performing Technologist: CanSharion DoveS  Examination Guidelines: A complete evaluation includes B-mode imaging, spectral Doppler, color Doppler, and power Doppler as needed of all accessible portions of each vessel. Bilateral testing is considered an integral part of a complete examination. Limited examinations for reoccurring indications may be performed as noted. The reflux portion of the exam is performed with the patient in reverse Trendelenburg.  +---------+---------------+---------+-----------+----------+--------------+ RIGHT    CompressibilityPhasicitySpontaneityPropertiesThrombus Aging +---------+---------------+---------+-----------+----------+--------------+ CFV      Full           Yes      Yes                                 +---------+---------------+---------+-----------+----------+--------------+  SFJ      Full                                                        +---------+---------------+---------+-----------+----------+--------------+ FV Prox  Full                                                        +---------+---------------+---------+-----------+----------+--------------+ FV Mid   Full                                                        +---------+---------------+---------+-----------+----------+--------------+ FV DistalFull                                                        +---------+---------------+---------+-----------+----------+--------------+ PFV      Full                                                        +---------+---------------+---------+-----------+----------+--------------+ POP      Full           Yes      Yes                                 +---------+---------------+---------+-----------+----------+--------------+ PTV      Full                                                         +---------+---------------+---------+-----------+----------+--------------+ PERO     Full                                                        +---------+---------------+---------+-----------+----------+--------------+   +---------+---------------+---------+-----------+----------+--------------+ LEFT     CompressibilityPhasicitySpontaneityPropertiesThrombus Aging +---------+---------------+---------+-----------+----------+--------------+ CFV      Full           Yes      Yes                                 +---------+---------------+---------+-----------+----------+--------------+ SFJ      Full                                                        +---------+---------------+---------+-----------+----------+--------------+  FV Prox  Full                                                        +---------+---------------+---------+-----------+----------+--------------+ FV Mid   Full                                                        +---------+---------------+---------+-----------+----------+--------------+ FV DistalFull                                                        +---------+---------------+---------+-----------+----------+--------------+ PFV      Full                                                        +---------+---------------+---------+-----------+----------+--------------+ POP      Full           Yes      Yes                                 +---------+---------------+---------+-----------+----------+--------------+ PTV      Full                                                        +---------+---------------+---------+-----------+----------+--------------+ PERO     Full                                                        +---------+---------------+---------+-----------+----------+--------------+     Summary: BILATERAL: - No evidence of deep vein thrombosis seen in the lower extremities, bilaterally.   *See table(s)  above for measurements and observations. Electronically signed by Curt Jews MD on 06/06/2019 at 3:51:27 PM.    Final     Labs:  CBC: Recent Labs    06/04/19 1125 06/05/19 0543 06/06/19 0522 06/07/19 0414  WBC 8.4 8.0 7.6 6.8  HGB 15.2 14.6 14.8 14.6  HCT 44.3 42.8 44.4 42.9  PLT 216 212 200 211    COAGS: Recent Labs    06/04/19 1125  INR 1.1  APTT 31    BMP: Recent Labs    06/04/19 1125 06/05/19 0543 06/06/19 0522 06/07/19 0414  NA 133* 140 139 141  K 4.0 3.8 3.3* 3.5  CL 98 108 107 109  CO2 26 24 21* 23  GLUCOSE 213* 144* 121* 115*  BUN 7 5* 6 6  CALCIUM 8.8* 8.6* 8.7* 8.7*  CREATININE 0.91 0.89 0.86 0.85  GFRNONAA >60 >60 >60 >60  GFRAA >60 >60 >60 >60    LIVER FUNCTION TESTS: Recent Labs    06/04/19 1125  BILITOT 0.9  AST 19  ALT 24  ALKPHOS 74  PROT 6.9  ALBUMIN 3.9    Assessment and Plan:  History of acute CVA s/p cerebral arteriogram with emergent mechanical thrombectomy and stent placement of mid/distal right MCA M1 occlusion achieving a TICI 3 revascularization 06/04/2019 by Dr. Karenann Cai.  Mr. Hazel condition continues to improve.  PT should be on schedule for today.  Follow up in place with Dr. Karenann Cai in clinic in 3 months, and for repeat image-guided diagnostic cerebral arteriogram in 6 months.   Electronically Signed: Murrell Redden, PA-C 06/07/2019, 10:13 AM    I spent a total of 15 Minutes at the the patient's bedside AND on the patient's Day floor or unit, greater than 50% of which was counseling/coordinating care for f/u stroke/intervention.

## 2019-06-07 NOTE — Progress Notes (Signed)
STROKE TEAM PROGRESS NOTE   INTERVAL HISTORY Daughter at bedside. Passed swallow study for modified diet and is tol well. Rehab planning underway.  Vitals:   06/06/19 1933 06/06/19 2319 06/07/19 0351 06/07/19 0801  BP: 140/77 134/65 (!) 145/92 137/79  Pulse: 86 65 75 72  Resp: 20 17 18 16   Temp: 98.5 F (36.9 C) 98.7 F (37.1 C) 98.1 F (36.7 C) 98 F (36.7 C)  TempSrc: Oral Oral Oral Oral  SpO2: 98% 96% 99% 98%  Weight:      Height:        CBC:  Recent Labs  Lab 06/04/19 1125 06/05/19 0543 06/06/19 0522 06/07/19 0414  WBC 8.4   < > 7.6 6.8  NEUTROABS 6.7  --   --   --   HGB 15.2   < > 14.8 14.6  HCT 44.3   < > 44.4 42.9  MCV 92.3   < > 94.9 92.9  PLT 216   < > 200 211   < > = values in this interval not displayed.    Basic Metabolic Panel:  Recent Labs  Lab 06/06/19 0522 06/07/19 0414  NA 139 141  K 3.3* 3.5  CL 107 109  CO2 21* 23  GLUCOSE 121* 115*  BUN 6 6  CREATININE 0.86 0.85  CALCIUM 8.7* 8.7*   Lipid Panel:     Component Value Date/Time   CHOL 180 06/05/2019 0543   TRIG 131 06/05/2019 0543   HDL 28 (L) 06/05/2019 0543   CHOLHDL 6.4 06/05/2019 0543   VLDL 26 06/05/2019 0543   LDLCALC 126 (H) 06/05/2019 0543   HgbA1c:  Lab Results  Component Value Date   HGBA1C 8.3 (H) 06/05/2019   Urine Drug Screen:     Component Value Date/Time   LABOPIA NONE DETECTED 06/05/2019 1926   COCAINSCRNUR NONE DETECTED 06/05/2019 1926   LABBENZ NONE DETECTED 06/05/2019 1926   AMPHETMU NONE DETECTED 06/05/2019 1926   THCU NONE DETECTED 06/05/2019 1926   LABBARB NONE DETECTED 06/05/2019 1926    Alcohol Level     Component Value Date/Time   ETH <10 06/04/2019 1157    IMAGING past 24 hours DG Swallowing Func-Speech Pathology  Result Date: 06/06/2019 Objective Swallowing Evaluation: Type of Study: MBS-Modified Barium Swallow Study  Patient Details Name: BERLE VOLLRATH MRN: [id number] Date of Birth: [birth year] Today's Date: 06/06/2019 Time: SLP Start Time  (ACUTE ONLY): 1357 -SLP Stop Time (ACUTE ONLY): 1416 SLP Time Calculation (min) (ACUTE ONLY): 19 min Past Medical History: Past Medical History: Diagnosis Date . Cancer [facility]) 2012  colon cancer . Closed fracture of left distal radius  . GERD (gastroesophageal reflux disease)  Past Surgical History: Past Surgical History: Procedure Laterality Date . COLON SURGERY  2012  colon cancer . IR CT HEAD LTD  06/04/2019 . IR CT HEAD LTD  06/04/2019 . IR INTRA CRAN STENT  06/04/2019 . IR PERCUTANEOUS ART THROMBECTOMY/INFUSION INTRACRANIAL INC DIAG ANGIO  06/04/2019 . IR US GUIDE VASC ACCESS RIGHT  06/04/2019 . OPEN REDUCTION INTERNAL FIXATION (ORIF) DISTAL RADIAL FRACTURE Left 09/20/2017  Procedure: OPEN REDUCTION INTERNAL FIXATION (ORIF) DISTAL RADIAL FRACTURE;  Surgeon: Leanora Cover, MD;  Location: [place];  Service: Orthopedics;  Laterality: Left; . RADIOLOGY WITH ANESTHESIA N/A 06/04/2019  Procedure: IR WITH ANESTHESIA;  Surgeon: Radiologist, Medication, MD;  Location: [place];  Service: Radiology;  Laterality: N/A; HPI: JEARL RETTERER is a 59 y.o. male with a history of colon cancer in 2012 and GERD, who  presents with left-sided weakness. 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  Subjective: cooperative Assessment / Plan / Recommendation CHL IP CLINICAL IMPRESSIONS 06/06/2019 Clinical Impression Patient presents with mild oropharyngeal dysphagia. Oral phase is remarkable for prolonged mastication and lingual residue. Pharyngeal phase is remarkable for reduced epiglottic inversion that leads to reduced laryngeal closure, resulting in penetration (PAS 4 and 5) and vallecular residue. Pt noted with throat clearing during the study following penetration, and was able to clear penetrate the majority of the time. Prior to administering POs, pt was also noted with grunting and throat clearing. Given the prolonged transit with regular solids d/t missing dentention, recommend  Dys 3 and thin liquids, and meds whole in puree. Ensure the pt is following general aspiration precautions (small bites/sips, upright while eating/drinking).  SLP Visit Diagnosis Dysphagia, oropharyngeal phase (R13.12) Attention and concentration deficit following -- Frontal lobe and executive function deficit following -- Impact on safety and function Mild aspiration risk   CHL IP TREATMENT RECOMMENDATION 06/06/2019 Treatment Recommendations Therapy as outlined in treatment plan below   Prognosis 06/06/2019 Prognosis for Safe Diet Advancement Good Barriers to Reach Goals Cognitive deficits Barriers/Prognosis Comment -- CHL IP DIET RECOMMENDATION 06/06/2019 SLP Diet Recommendations Dysphagia 3 (Mech soft) solids;Thin liquid Liquid Administration via Straw;Cup;Spoon Medication Administration Whole meds with puree Compensations Minimize environmental distractions;Slow rate;Small sips/bites Postural Changes Seated upright at 90 degrees   CHL IP OTHER RECOMMENDATIONS 06/06/2019 Recommended Consults -- Oral Care Recommendations Oral care BID Other Recommendations Have oral suction available   CHL IP FOLLOW UP RECOMMENDATIONS 06/06/2019 Follow up Recommendations Inpatient Rehab   CHL IP FREQUENCY AND DURATION 06/06/2019 Speech Therapy Frequency (ACUTE ONLY) min 2x/week Treatment Duration 2 weeks      CHL IP ORAL PHASE 06/06/2019 Oral Phase Impaired Oral - Pudding Teaspoon -- Oral - Pudding Cup -- Oral - Honey Teaspoon -- Oral - Honey Cup -- Oral - Nectar Teaspoon -- Oral - Nectar Cup -- Oral - Nectar Straw -- Oral - Thin Teaspoon WFL Oral - Thin Cup Lingual/palatal residue Oral - Thin Straw Lingual/palatal residue Oral - Puree Lingual/palatal residue Oral - Mech Soft -- Oral - Regular Impaired mastication Oral - Multi-Consistency -- Oral - Pill WFL Oral Phase - Comment --  CHL IP PHARYNGEAL PHASE 06/06/2019 Pharyngeal Phase Impaired Pharyngeal- Pudding Teaspoon -- Pharyngeal -- Pharyngeal- Pudding Cup -- Pharyngeal -- Pharyngeal-  Honey Teaspoon -- Pharyngeal -- Pharyngeal- Honey Cup -- Pharyngeal -- Pharyngeal- Nectar Teaspoon -- Pharyngeal -- Pharyngeal- Nectar Cup -- Pharyngeal -- Pharyngeal- Nectar Straw -- Pharyngeal -- Pharyngeal- Thin Teaspoon WFL Pharyngeal -- Pharyngeal- Thin Cup Penetration/Aspiration during swallow;Reduced airway/laryngeal closure Pharyngeal Material enters airway, CONTACTS cords and then ejected out Pharyngeal- Thin Straw Reduced airway/laryngeal closure;Penetration/Aspiration during swallow Pharyngeal Material enters airway, CONTACTS cords and then ejected out;Material enters airway, CONTACTS cords and not ejected out Pharyngeal- Puree Pharyngeal residue - valleculae Pharyngeal -- Pharyngeal- Mechanical Soft -- Pharyngeal -- Pharyngeal- Regular Pharyngeal residue - valleculae Pharyngeal -- Pharyngeal- Multi-consistency -- Pharyngeal -- Pharyngeal- Pill WFL Pharyngeal -- Pharyngeal Comment --  CHL IP CERVICAL ESOPHAGEAL PHASE 06/06/2019 Cervical Esophageal Phase WFL Pudding Teaspoon -- Pudding Cup -- Honey Teaspoon -- Honey Cup -- Nectar Teaspoon -- Nectar Cup -- Nectar Straw -- Thin Teaspoon -- Thin Cup -- Thin Straw -- Puree -- Mechanical Soft -- Regular -- Multi-consistency -- Pill -- Cervical Esophageal Comment -- Osie Bond., M.A. [facility] Pager [phone] Office [phone] 06/06/2019, 3:14 PM  VAS [place] LOWER EXTREMITY VENOUS (DVT)  Result Date: 06/06/2019  Lower Venous DVTStudy Indications: Stroke.  Comparison Study: No prior study on file Performing Technologist: Sharion Dove RVS  Examination Guidelines: A complete evaluation includes B-mode imaging, spectral Doppler, color Doppler, and power Doppler as needed of all accessible portions of each vessel. Bilateral testing is considered an integral part of a complete examination. Limited examinations for reoccurring indications may be performed as noted. The reflux portion of the exam is performed with the patient  in reverse Trendelenburg.  +---------+---------------+---------+-----------+----------+--------------+ RIGHT    CompressibilityPhasicitySpontaneityPropertiesThrombus Aging +---------+---------------+---------+-----------+----------+--------------+ CFV      Full           Yes      Yes                                 +---------+---------------+---------+-----------+----------+--------------+ SFJ      Full                                                        +---------+---------------+---------+-----------+----------+--------------+ FV Prox  Full                                                        +---------+---------------+---------+-----------+----------+--------------+ FV Mid   Full                                                        +---------+---------------+---------+-----------+----------+--------------+ FV DistalFull                                                        +---------+---------------+---------+-----------+----------+--------------+ PFV      Full                                                        +---------+---------------+---------+-----------+----------+--------------+ POP      Full           Yes      Yes                                 +---------+---------------+---------+-----------+----------+--------------+ PTV      Full                                                        +---------+---------------+---------+-----------+----------+--------------+ PERO     Full                                                        +---------+---------------+---------+-----------+----------+--------------+   +---------+---------------+---------+-----------+----------+--------------+  LEFT     CompressibilityPhasicitySpontaneityPropertiesThrombus Aging +---------+---------------+---------+-----------+----------+--------------+ CFV      Full           Yes      Yes                                  +---------+---------------+---------+-----------+----------+--------------+ SFJ      Full                                                        +---------+---------------+---------+-----------+----------+--------------+ FV Prox  Full                                                        +---------+---------------+---------+-----------+----------+--------------+ FV Mid   Full                                                        +---------+---------------+---------+-----------+----------+--------------+ FV DistalFull                                                        +---------+---------------+---------+-----------+----------+--------------+ PFV      Full                                                        +---------+---------------+---------+-----------+----------+--------------+ POP      Full           Yes      Yes                                 +---------+---------------+---------+-----------+----------+--------------+ PTV      Full                                                        +---------+---------------+---------+-----------+----------+--------------+ PERO     Full                                                        +---------+---------------+---------+-----------+----------+--------------+     Summary: BILATERAL: - No evidence of deep vein thrombosis seen in the lower extremities, bilaterally.   *See table(s) above for measurements and observations. Electronically signed by Curt Jews MD on 06/06/2019 at 3:51:27 PM.    Final  PHYSICAL EXAM  Temp:  [97.5 F (36.4 C)-98.7 F (37.1 C)] 98 F (36.7 C) (03/18 0801) Pulse Rate:  [65-92] 72 (03/18 0801) Resp:  [12-27] 16 (03/18 0801) BP: (134-166)/(65-99) 137/79 (03/18 0801) SpO2:  [94 %-99 %] 98 % (03/18 0801) FiO2 (%):  [21 %] 21 % (03/17 1700)  General - Well nourished, well developed, in no apparent distress.  Ophthalmologic - fundi not visualized due to  noncooperation.  Cardiovascular - Regular rhythm and rate.  Neuro - awake alert, mild dysarthria, orientated to place, people, year but not to month. No aphasia, fluent language, following all simple commands. PERRL, mild right gaze preference, able to gaze to left but not complete. Still has mild visual neglect and simutagnosia. Left facial droop. Tongue midline. RUE proximal 4/5 due to right mild shoulder pain but distal 5/5, LUE proximal and distal 5/5. BLE 5/5. Sensation symmetrical, coordination intact b/l FTN. Gait not tested.    ASSESSMENT/PLAN Mr. ALIM MCFARLAND is a 59 y.o. male with history of colon cancer in 2012 presenting to [facility] with L sided weakness. Out of window for tPA. CTA w/ R M1 occlusion w/ penumbra. Transferred to [facility]. [facility] for IR.   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 ASPECTS 10.     CTA head & neck nonocclusive distal R M1 thrombus into bifurcation. Neck ok  CT perfusion 10ml core, 182ml territory at risk  IR mid/distal R M1 occlusion. Mechanical thrombectomy w/ TICI2c revascularization. Dissection flap at site of occlusion. Cerebral angio w/ reocclusion. R M1 stent placed w/ TICI3 flow. Treated w/ Integrilin gtt.   Post IR CT no hemorrhage. pathcy cortical/subcortical R MCA infarct (frontal operculum, insula, temporoparietal jxn). Sinus dz.   Not able to have MRI due to left ankle device  CT head repeat 3/16 no ICH  LE Doppler no DVT  2D Echo EF 65-70%. No source of embolus   TEE and loop for cardioembolic work up   Hypercoagulable labs pending   UDS neg  LDL 126  HgbA1c 8.3  SCDs for VTE prophylaxis  No antithrombotic prior to admission, now on aspirin 325 mg daily and Brilinta (ticagrelor) 90 mg bid following Brilinta load.   Therapy recommendations:   CIR  Disposition:  pending   Blood Pressure  Home meds:  None, no hx HTN  Stable now . BP goal < 180/105 for now . Long-term BP goal normotensive  Hyperlipidemia  Home meds:  No statin  LDL 126, goal < 70  Added lipitor 80  Continue statin at discharge  Diabetes, new diagnosis  2 random glucoses elevated  Home meds:  None  HgbA1c 8.3, goal < 7.0  CBG  SSI  DM coordinator consult  Need PCP follow up  Dysphagia . Secondary to stroke . SLP following . NG placed for meds -> now off . Passed MBS for modified dysphagia diet.  . D/c IVF   Other Stroke Risk Factors  Former Cigarette smoker  ETOH use, alcohol level <10, advised to drink no more than 2 drink(s) a day  Obesity, Body mass index is 32.29 kg/m., recommend weight loss, diet and exercise as appropriate   Other Active Problems  Hx colon cancer 2004 s/p surgical resection and chemo, followed up until 2012, no recurrence as per daughter  Hyponatremia 133->140  Hospital day #  [place], ARNP-C, ANVP-BC Pager: [phone] 06/07/2019 9:51 AM   To contact Stroke Continuity provider, please refer to [email or web address] After hours, contact General Neurology

## 2019-06-07 NOTE — Progress Notes (Signed)
Initial Nutrition Assessment  DOCUMENTATION CODES:   Obesity unspecified  INTERVENTION:   Provided diabetic diet education  Ensure Enlive po BID, each supplement provides 350 kcal and 20 grams of protein    NUTRITION DIAGNOSIS:   Inadequate oral intake related to poor appetite as evidenced by meal completion < 50%.    GOAL:   Patient will meet greater than or equal to 90% of their needs    MONITOR:   PO intake, Supplement acceptance, Weight trends, Diet advancement  REASON FOR ASSESSMENT:   Consult Diet education  ASSESSMENT:   Pt with a PMH significant for colon cancer who presented with L sided weakness and was admitted for R MCA stroke.  3/17 MBS, DYS3/thins   RD consulted for diet education for new onset diabetes. Pt's daughter in room at time of visit. Pt's daughter reports pt does skip meals and eats a lot of sweets. Discussed need to eat consistent, balanced meals throughout the day and reviewed methods for balancing blood sugar. Pt and daughter expressed understanding.   No PO intake documented. RD observed meal tray, ~50% consumed.   Medications reviewed and include: SSI Labs reviewed. CBGs 117-139  UOP: 2,045ml x24 hours I/O: -1,978.29ml since admit  NUTRITION - FOCUSED PHYSICAL EXAM:    Most Recent Value  Orbital Region  No depletion  Upper Arm Region  No depletion  Thoracic and Lumbar Region  No depletion  Buccal Region  No depletion  Temple Region  No depletion  Clavicle Bone Region  No depletion  Clavicle and Acromion Bone Region  No depletion  Scapular Bone Region  No depletion  Dorsal Hand  No depletion  Patellar Region  No depletion  Anterior Thigh Region  No depletion  Posterior Calf Region  No depletion  Edema (RD Assessment)  Mild  Hair  Reviewed  Eyes  Reviewed  Mouth  Reviewed  Skin  Reviewed  Nails  Reviewed       Diet Order:   Diet Order            Diet NPO time specified  Diet effective ____        DIET DYS 3  Room service appropriate? Yes; Fluid consistency: Thin  Diet effective now              EDUCATION NEEDS:   Education needs have been addressed  Skin:  Skin Assessment: Skin Integrity Issues: Skin Integrity Issues:: Incisions Incisions: R groin  Last BM:  06/06/19  Height:   Ht Readings from Last 1 Encounters:  06/04/19 5\' 11"  (1.803 m)    Weight:   Wt Readings from Last 1 Encounters:  06/04/19 105 kg    BMI:  Body mass index is 32.29 kg/m.  Estimated Nutritional Needs:   Kcal:  2200-2400  Protein:  125-140 grams  Fluid:  >/= 2L    Larkin Ina, MS, RD, LDN RD pager number and weekend/on-call pager number located in [place].

## 2019-06-07 NOTE — Progress Notes (Signed)
  Speech Language Pathology Treatment: Dysphagia  Patient Details Name: Scott Day MRN: [id number] DOB: 19-Sep-1960 Today's Date: 06/07/2019 Time:  -     Assessment / Plan / Recommendation Clinical Impression  Skilled ST provided for dysphagia therapy. Pt having snack of fruit, diet soda and cookie (3g) when entering room. Daughter shared pt was just diagnosed with diabetes and she brought him these snacks that comply with diabetic recommendations. Patient tolerated all consistencies well with no cough/choking. He is impulsive and wanting to eat faster than his daughter is feeding him, but she was very good at controlling his size and rate of intake. Education of aspiration precautions reviewed with patient (will need ongoing reinforcement) and daughter. ST will continue to monitor for tolerance and upgrade of diet.     HPI HPI: Scott Day is a 59 y.o. male with a history of colon cancer in 2012 and GERD, who presents with left-sided weakness. 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      SLP Plan   Continue with current plan of care       Recommendations   2x week for 2 weeks                        GO               Wynelle Bourgeois., MA, CCC-SLP 06/07/2019, 3:38 PM

## 2019-06-08 ENCOUNTER — Encounter (HOSPITAL_COMMUNITY)
Admission: EM | Disposition: A | Discharge status: Skilled Nursing Facility | Payer: Self-pay | Source: Ambulatory Visit | Attending: Neurology

## 2019-06-08 ENCOUNTER — Inpatient Hospital Stay (HOSPITAL_COMMUNITY): Payer: Medicaid Other

## 2019-06-08 ENCOUNTER — Inpatient Hospital Stay (HOSPITAL_COMMUNITY): Payer: Medicaid Other | Admitting: Certified Registered Nurse Anesthetist

## 2019-06-08 ENCOUNTER — Encounter (HOSPITAL_COMMUNITY): Payer: Self-pay | Admitting: Neurology

## 2019-06-08 DIAGNOSIS — I639 Cerebral infarction, unspecified: Secondary | ICD-10-CM

## 2019-06-08 DIAGNOSIS — I6389 Other cerebral infarction: Secondary | ICD-10-CM

## 2019-06-08 HISTORY — PX: BUBBLE STUDY: SHX6837

## 2019-06-08 HISTORY — PX: TEE WITHOUT CARDIOVERSION: SHX5443

## 2019-06-08 LAB — BASIC METABOLIC PANEL
Anion gap: 11 (ref 5–15)
BUN: 8 mg/dL (ref 6–20)
CO2: 22 mmol/L (ref 22–32)
Calcium: 9 mg/dL (ref 8.9–10.3)
Chloride: 109 mmol/L (ref 98–111)
Creatinine, Ser: 0.85 mg/dL (ref 0.61–1.24)
GFR calc Af Amer: >60 mL/min (ref >60)
GFR calc non Af Amer: >60 mL/min (ref >60)
Glucose, Bld: 128 mg/dL — ABNORMAL HIGH (ref 70–99)
Potassium: 3.5 mmol/L (ref 3.5–5.1)
Sodium: 142 mmol/L (ref 135–145)

## 2019-06-08 LAB — CBC
HCT: 43.5 % (ref 39.0–52.0)
Hemoglobin: 14.8 g/dL (ref 13.0–17.0)
MCH: 31.7 pg (ref 26.0–34.0)
MCHC: 34 g/dL (ref 30.0–36.0)
MCV: 93.1 fL (ref 80.0–100.0)
Platelets: 219 10*3/uL (ref 150–400)
RBC: 4.67 MIL/uL (ref 4.22–5.81)
RDW: 12.4 % (ref 11.5–15.5)
WBC: 7.3 10*3/uL (ref 4.0–10.5)
nRBC: 0 % (ref 0.0–0.2)

## 2019-06-08 LAB — GLUCOSE, CAPILLARY
Glucose-Capillary: 117 mg/dL — ABNORMAL HIGH (ref 70–99)
Glucose-Capillary: 123 mg/dL — ABNORMAL HIGH (ref 70–99)
Glucose-Capillary: 125 mg/dL — ABNORMAL HIGH (ref 70–99)
Glucose-Capillary: 146 mg/dL — ABNORMAL HIGH (ref 70–99)
Glucose-Capillary: 183 mg/dL — ABNORMAL HIGH (ref 70–99)
Glucose-Capillary: 315 mg/dL — ABNORMAL HIGH (ref 70–99)

## 2019-06-08 SURGERY — LOOP RECORDER INSERTION

## 2019-06-08 SURGERY — ECHOCARDIOGRAM, TRANSESOPHAGEAL
Anesthesia: General

## 2019-06-08 MED ORDER — PROPOFOL 10 MG/ML IV BOLUS
INTRAVENOUS | Status: DC | PRN
  Administered 2019-06-08 (×3): 20 mg via INTRAVENOUS

## 2019-06-08 MED ORDER — SODIUM CHLORIDE 0.9 % IV SOLN: INTRAVENOUS | Status: DC

## 2019-06-08 MED ORDER — PROPOFOL 500 MG/50ML IV EMUL
INTRAVENOUS | Status: DC | PRN
  Administered 2019-06-08: 150 ug/kg/min via INTRAVENOUS
  Administered 2019-06-08: 220 ug/kg/min via INTRAVENOUS

## 2019-06-08 MED ORDER — LIDOCAINE 2% (20 MG/ML) 5 ML SYRINGE
INTRAMUSCULAR | Status: DC | PRN
  Administered 2019-06-08: 60 mg via INTRAVENOUS

## 2019-06-08 NOTE — Anesthesia Preprocedure Evaluation (Signed)
Anesthesia Evaluation  Patient identified by MRN, date of birth, ID band Patient awake    Reviewed: Allergy & Precautions, NPO status , Patient's Chart, lab work & pertinent test results  Airway Mallampati: II  TM Distance: >3 FB     Dental  (+) Partial Upper, Teeth Intact, Dental Advisory Given   Pulmonary former smoker,    breath sounds clear to auscultation       Cardiovascular  Rhythm:Regular Rate:Normal     Neuro/Psych    GI/Hepatic   Endo/Other    Renal/GU      Musculoskeletal   Abdominal   Peds  Hematology   Anesthesia Other Findings   Reproductive/Obstetrics                             Anesthesia Physical Anesthesia Plan  ASA: III  Anesthesia Plan: General   Post-op Pain Management:    Induction: Intravenous  PONV Risk Score and Plan: Ondansetron and Dexamethasone  Airway Management Planned: Oral ETT  Additional Equipment:   Intra-op Plan:   Post-operative Plan: Extubation in OR  Informed Consent: I have reviewed the patients History and Physical, chart, labs and discussed the procedure including the risks, benefits and alternatives for the proposed anesthesia with the patient or authorized representative who has indicated his/her understanding and acceptance.     Dental advisory given  Plan Discussed with: CRNA and Anesthesiologist  Anesthesia Plan Comments:         Anesthesia Quick Evaluation

## 2019-06-08 NOTE — Interval H&P Note (Signed)
History and Physical Interval Note:  06/08/2019 9:08 AM  Scott Day  has presented today for surgery, with the diagnosis of STROKE.  The various methods of treatment have been discussed with the patient and family. After consideration of risks, benefits and other options for treatment, the patient has consented to  Procedure(s): TRANSESOPHAGEAL ECHOCARDIOGRAM (TEE) (N/A) as a surgical intervention.  The patient's history has been reviewed, patient examined, no change in status, stable for surgery.  I have reviewed the patient's chart and labs.  Questions were answered to the patient's satisfaction.     Constanza Mincy Harrell Gave

## 2019-06-08 NOTE — Progress Notes (Signed)
STROKE TEAM PROGRESS NOTE   INTERVAL HISTORY Daughter at bedside. TEE was done today, but loop was not placed. According to EP note, this was not done d/t medicaid pending? This sounds like it will be planned as out pt once insurance is approved. Daughter assures me she will f/u.   Vitals:   06/08/19 0902 06/08/19 0940 06/08/19 0950 06/08/19 1238  BP: (!) 158/89 125/80 126/81 (!) 108/50  Pulse: 72 78 70 75  Resp: 13 17 15 18   Temp: 98 F (36.7 C) 97.8 F (36.6 C)  97.7 F (36.5 C)  TempSrc: Oral Oral  Oral  SpO2: 98% 96% 96% 99%  Weight:      Height:        CBC:  Recent Labs  Lab 06/04/19 1125 06/05/19 0543 06/07/19 0414 06/08/19 0345  WBC 8.4   < > 6.8 7.3  NEUTROABS 6.7  --   --   --   HGB 15.2   < > 14.6 14.8  HCT 44.3   < > 42.9 43.5  MCV 92.3   < > 92.9 93.1  PLT 216   < > 211 219   < > = values in this interval not displayed.    Basic Metabolic Panel:  Recent Labs  Lab 06/07/19 0414 06/08/19 0345  NA 141 142  K 3.5 3.5  CL 109 109  CO2 23 22  GLUCOSE 115* 128*  BUN 6 8  CREATININE 0.85 0.85  CALCIUM 8.7* 9.0   Lipid Panel:     Component Value Date/Time   CHOL 180 06/05/2019 0543   TRIG 131 06/05/2019 0543   HDL 28 (L) 06/05/2019 0543   CHOLHDL 6.4 06/05/2019 0543   VLDL 26 06/05/2019 0543   LDLCALC 126 (H) 06/05/2019 0543   HgbA1c:  Lab Results  Component Value Date   HGBA1C 8.3 (H) 06/05/2019   Urine Drug Screen:     Component Value Date/Time   LABOPIA NONE DETECTED 06/05/2019 1926   COCAINSCRNUR NONE DETECTED 06/05/2019 1926   LABBENZ NONE DETECTED 06/05/2019 1926   AMPHETMU NONE DETECTED 06/05/2019 1926   THCU NONE DETECTED 06/05/2019 1926   LABBARB NONE DETECTED 06/05/2019 1926    Alcohol Level     Component Value Date/Time   ETH <10 06/04/2019 1157    IMAGING past 24 hours ECHO TEE  Result Date: 06/08/2019    TRANSESOPHOGEAL ECHO REPORT   Patient Name:   Scott Day Date of Exam: 06/08/2019 Medical Rec #:  [id number]       Height:       71.0 in Accession #:    [id number]     Weight:       231.5 lb Date of Birth:  Jul 16, 1960      BSA:          2.244 m Patient Age:    59 years       BP:           125/80 mmHg Patient Gender: M              HR:           97 bpm. Exam Location:  Inpatient Procedure: Transesophageal Echo and Saline Contrast Bubble Study Indications:     Stroke  History:         Patient has prior history of Echocardiogram examinations, most                  recent 06/05/2019. Risk Factors:Former Smoker and Hypertension.  Sonographer:     Clayton Lefort RDCS (AE) Referring Phys:  [place] Diagnosing Phys: Buford Dresser MD PROCEDURE: After discussion of the risks and benefits of a TEE, an informed consent was obtained from the patient. The transesophogeal probe was passed without difficulty through the esophogus of the patient. Sedation performed by different physician. The patient was monitored while under deep sedation. Anesthestetic sedation was provided intravenously by Anesthesiology: 345mg  of Propofol, 60mg  of Lidocaine. Image quality was good. The patient's vital signs; including heart rate, blood pressure, and oxygen saturation; remained stable throughout the procedure. The patient developed no complications during the procedure. IMPRESSIONS  1. Left ventricular ejection fraction, by estimation, is 65 to 70%. The left ventricle has normal function. The left ventricle has no regional wall motion abnormalities. There is moderate concentric left ventricular hypertrophy. Left ventricular diastolic function could not be evaluated.  2. Right ventricular systolic function is normal. The right ventricular size is normal. Tricuspid regurgitation signal is inadequate for assessing PA pressure.  3. No left atrial/left atrial appendage thrombus was detected.  4. The mitral valve is normal in structure. Trivial mitral valve regurgitation. No evidence of mitral stenosis.  5. The aortic valve is tricuspid. Aortic  valve regurgitation is not visualized. No aortic stenosis is present.  6. There is mild (Grade II) plaque involving the descending aorta.  7. Agitated saline contrast bubble study was negative, with no evidence of any interatrial shunt. Conclusion(s)/Recommendation(s): No LA/LAA thrombus identified. Negative bubble study for interatrial shunt. No intracardiac source of embolism detected on this on this transesophageal echocardiogram. FINDINGS  Left Ventricle: Left ventricular ejection fraction, by estimation, is 65 to 70%. The left ventricle has normal function. The left ventricle has no regional wall motion abnormalities. The left ventricular internal cavity size was normal in size. There is  moderate concentric left ventricular hypertrophy. Left ventricular diastolic function could not be evaluated. Right Ventricle: The right ventricular size is normal. No increase in right ventricular wall thickness. Right ventricular systolic function is normal. Tricuspid regurgitation signal is inadequate for assessing PA pressure. Left Atrium: Left atrial size was not assessed. No left atrial/left atrial appendage thrombus was detected. Right Atrium: Right atrial size was not assessed. Pericardium: There is no evidence of pericardial effusion. Mitral Valve: The mitral valve is normal in structure. Trivial mitral valve regurgitation. No evidence of mitral valve stenosis. Tricuspid Valve: The tricuspid valve is normal in structure. Tricuspid valve regurgitation is trivial. No evidence of tricuspid stenosis. Aortic Valve: The aortic valve is tricuspid. Aortic valve regurgitation is not visualized. No aortic stenosis is present. Pulmonic Valve: The pulmonic valve was grossly normal. Pulmonic valve regurgitation is trivial. No evidence of pulmonic stenosis. Aorta: The aortic root is normal in size and structure. There is mild (Grade II) plaque involving the descending aorta. IAS/Shunts: No atrial level shunt detected by color flow  Doppler. Agitated saline contrast was given intravenously to evaluate for intracardiac shunting. Agitated saline contrast bubble study was negative, with no evidence of any interatrial shunt. There  is no evidence of a patent foramen ovale. Buford Dresser MD Electronically signed by Buford Dresser MD Signature Date/Time: 06/08/2019/2:06:11 PM    Final     PHYSICAL EXAM  Temp:  [97.2 F (36.2 C)-98 F (36.7 C)] 97.7 F (36.5 C) (03/19 1238) Pulse Rate:  [65-82] 75 (03/19 1238) Resp:  [13-20] 18 (03/19 1238) BP: (108-158)/(50-89) 108/50 (03/19 1238) SpO2:  [92 %-99 %] 99 % (03/19 1238)  General - Well nourished,  well developed, in no apparent distress.  Ophthalmologic - fundi not visualized due to noncooperation.  Cardiovascular - Regular rhythm and rate.  Neuro - awake alert, mild dysarthria, orientated to place, people, year but not to month. No aphasia, fluent language, following all simple commands. PERRL, mild right gaze preference, able to gaze to left but not complete. Still has mild visual neglect and simutagnosia. Left facial droop. Tongue midline. RUE proximal 4/5 due to right mild shoulder pain but distal 5/5, LUE proximal and distal 5/5. BLE 5/5. Sensation symmetrical, coordination intact b/l FTN. Gait not tested.    ASSESSMENT/PLAN Mr. HIROMU ADLER is a 59 y.o. male with history of colon cancer in 2012 presenting to [facility] with L sided weakness. Out of window for tPA. CTA w/ R M1 occlusion w/ penumbra. Transferred to [facility] for IR.   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 ASPECTS 10.     CTA head & neck nonocclusive distal R M1 thrombus into bifurcation. Neck ok  CT perfusion 7ml core, 164ml territory at risk  IR mid/distal R M1 occlusion. Mechanical thrombectomy w/  TICI2c revascularization. Dissection flap at site of occlusion. Cerebral angio w/ reocclusion. R M1 stent placed w/ TICI3 flow. Treated w/ Integrilin gtt.   Post IR CT no hemorrhage. pathcy cortical/subcortical R MCA infarct (frontal operculum, insula, temporoparietal jxn). Sinus dz.   Not able to have MRI due to left ankle device  CT head repeat 3/16 no ICH  LE Doppler no DVT  2D Echo EF 65-70%. No source of embolus   TEE completed; loop not done. This will need out pt f/u as pt is uninsured at this time.    Hypercoagulable labs: out pt f/u  UDS neg  LDL 126  HgbA1c 8.3  SCDs for VTE prophylaxis  No antithrombotic prior to admission, now on aspirin 325 mg daily and Brilinta (ticagrelor) 90 mg bid following Brilinta load.   Therapy recommendations:  CIR  Disposition:  pending bed availablility  Blood Pressure  Home meds:  None, no hx HTN  Stable now . BP goal < 180/105 for now . Long-term BP goal normotensive  Hyperlipidemia  Home meds:  No statin  LDL 126, goal < 70  Added lipitor 80  Continue statin at discharge  Diabetes, new diagnosis  2 random glucoses elevated  Home meds:  None  HgbA1c 8.3, goal < 7.0  CBG  SSI  DM coordinator consult  Need PCP follow up  Dysphagia . Secondary to stroke . SLP following . NG placed for meds -> now off . Passed MBS for modified dysphagia diet.  . D/c IVF   Other Stroke Risk Factors  Former Cigarette smoker  ETOH use, alcohol level <10, advised to drink no more than 2 drink(s) a day  Obesity, Body mass index is 32.29 kg/m., recommend weight loss, diet and exercise as appropriate   Other Active Problems  Hx colon cancer 2004 s/p surgical resection and chemo, followed up until 2012, no recurrence as per daughter  Hyponatremia 133->140  Hospital day # [place], ARNP-C, ANVP-BC Pager: [phone] 06/08/2019 3:34 PM   To contact Stroke Continuity provider, please refer to  [email or web address] After hours, contact General Neurology

## 2019-06-08 NOTE — Progress Notes (Signed)
Referring Physician(s): Dr. Leonel Ramsay  Supervising Physician: Dr. 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  Daughter at bedside.  Reports ongoing left sided neglect.  Being considered to rehab admission soon.    Allergies: Patient has no known allergies.  Medications: Prior to Admission medications   Medication Sig Start Date End Date Taking? Authorizing Provider  HYDROcodone-acetaminophen (NORCO) 5-325 MG tablet 1-2 tabs po q6 hours prn pain Patient not taking: Reported on 06/04/2019 09/20/17   Leanora Cover, MD     Vital Signs: BP 126/81   Pulse 70   Temp 97.8 F (36.6 C) (Oral)   Resp 15   Ht '5\' 11"'$  (1.803 m)   Wt 231 lb 7.7 oz (105 kg)   SpO2 96%   BMI 32.29 kg/m   Physical Exam  NAD, alert, conversant Neuro: Right gaze preference. Mild left-sided neglect, but turns head towards speaker.  Moving all 4 extremities.  Decreased L hand grip strength.   Imaging: DG Abd 1 View  Result Date: 06/05/2019 CLINICAL DATA:  New NG tube EXAM: ABDOMEN - 1 VIEW COMPARISON:  None. FINDINGS: Advancement of NG tube such that the tip is at the expected location of the junction of the second third portion the duodenum. IMPRESSION: NG tube extends into duodenum Electronically Signed   By: Suzy Bouchard M.D.   On: 06/05/2019 14:28   DG Abd 1 View  Result Date: 06/04/2019 CLINICAL DATA:  Nasogastric tube placement. EXAM: ABDOMEN - 1 VIEW COMPARISON:  None. FINDINGS: A nasogastric tube is seen with its distal tip overlying the body of the stomach. This is approximately 7.8 cm distal to the expected region of the gastroesophageal junction. The bowel gas pattern is normal. No radio-opaque calculi or other significant radiographic abnormality are seen. IMPRESSION: Nasogastric tube positioning, as described  above. Further advancement of the NG tube by approximately 5 cm is recommended to decrease the risk of aspiration. Electronically Signed   By: Virgina Norfolk M.D.   On: 06/04/2019 20:23   CT HEAD WO CONTRAST  Result Date: 06/05/2019 CLINICAL DATA:  Follow-up stroke.  Right MCA territory infarctions. EXAM: CT HEAD WITHOUT CONTRAST TECHNIQUE: Contiguous axial images were obtained from the base of the skull through the vertex without intravenous contrast. COMPARISON:  Multiple CT studies over the last 2 days. FINDINGS: Brain: Patchy low-density in the right middle cerebral artery territory continues to become more evident by CT consistent with multiple infarctions in that region. These are notable in the insula, right lateral and posterior temporal lobe, right frontal operculum and right frontal parietal junction. No significant swelling, mass effect or any detectable hemorrhage. Right MCA stent as seen previously. Vascular: There is atherosclerotic calcification of the major vessels at the base of the brain. Right MCA stent as seen previously. Skull: Negative Sinuses/Orbits: Chronic sinusitis particularly affecting the right maxillary sinus. Orbits negative. Other: None IMPRESSION: Multiple infarctions in the right middle cerebral artery territory becoming more evident on CT, with increasing low density. No evidence of significant mass effect or any hemorrhage. Electronically Signed   By: Nelson Chimes M.D.   On: 06/05/2019 15:38   CT HEAD WO CONTRAST  Result Date: 06/04/2019 CLINICAL DATA:  Stroke, follow-up. EXAM: CT HEAD WITHOUT CONTRAST TECHNIQUE: Contiguous axial images were obtained from the base of the skull through the vertex  without intravenous contrast. COMPARISON:  Noncontrast head CT and CT angiogram head/neck performed earlier the same day 06/04/2019 FINDINGS: Brain: There has been interval development of patchy acute cortical/subcortical infarction changes within the right MCA vascular  territory. Findings are most notable within the right frontal operculum, right insula and within portions of the right temporoparietal junction (series 3, image 20) (series 3, image 18) (series 5, image 48). No evidence of hemorrhagic conversion. No significant mass effect. No midline shift. There is no hydrocephalus. No extra-axial fluid collection is identified. Vascular: A stent is now present in the region of the M1 right middle cerebral artery. Otherwise, no hyperdense vessel is identified. Skull: Normal. Negative for fracture or focal lesion. Sinuses/Orbits: Visualized orbits demonstrate no acute abnormality. Moderate mucosal thickening and frothy secretions within the right maxillary sinus. Additional mild scattered paranasal sinus mucosal thickening. Left frontal sinus mucous retention cyst. No significant mastoid effusion. IMPRESSION: Interval development of patchy acute cortical/subcortical infarction changes within the right MCA vascular territory most notable within the right frontal operculum, right insula and portions of the right temporoparietal junction. No hemorrhagic conversion. No midline shift or significant mass effect. Paranasal sinus disease as described, most notably right maxillary. Electronically Signed   By: Kellie Simmering DO   On: 06/04/2019 18:43   IR Intra Cran Stent  Result Date: 06/04/2019 CLINICAL DATA:  58 year old male with past medical history significant for colon cancer in 2012. He developed left-sided weakness and neglect on awakening this morning. He was taken to [facility] hospital. Head CT showed small hypodensity in the right insula with no hemorrhage. NIHSS 10. CT angiogram showed tapering of the right M1/MCA with short-segment filling defect. CT perfusion showed an estimated core volume of 25 mL with a mismatch of 126 mL. He was transferred to our service for emergency treatment of the right M1/MCA occlusion. EXAM: IR CT HEAD LIMITED; IR PERCUTANEOUS ART  THORMBECTOMY/INFUSION INTRACRANIAL INCLUDE DIAG ANGIO; IR ULTRASOUND GUIDANCE VASC ACCESS RIGHT; INTRACRANIAL STENT (INCL PTA) ANESTHESIA/SEDATION: General anesthesia. MEDICATIONS: Intra arterial Verapamil 5 mg; intra arterial Integrilin 19 mg. CONTRAST:  66m OMNIPAQUE IOHEXOL 300 MG/ML SOLN, 757mOMNIPAQUE IOHEXOL 240 MG/ML SOLN PROCEDURE: Operator: Dr. KaPedro EarlsMD. No healthcare proxy or next of kin available for informed consent. Discussion held with the neurohospitalist attending. Agreement reached that the procedure would be in the best interest of the patient and benefits outweighed risks. The patient was made to lie supine on the angiography table. He was prepped and draped utilizing the usual sterile technique. Using gray scale ultrasound-guided, a micropuncture kit and modified Seldinger technique, access was gained to the right common femoral artery. An 8 French femoral sheath was then placed. Under fluoroscopy, an 8 Fr[place] balloon guide catheter was navigated over a 6 FrPakist[place]renstein 2 catheter and a 0.038 inch Terumo Glidewire into the aortic arch. Under fluoroscopy, the catheter was advanced into the right common carotid artery and then into the right internal carotid artery. Frontal and lateral angiograms of the head were obtained showing a distal right M1/MCA occlusion with minimal delayed penetration of contrast. A large bore aspiration catheter was then navigated through the walrus balloon guide catheter and over a phenom 21 microcatheter and a synchro support microguidewire into the cavernous segment of the right ICA. The microcatheter was then advanced over the microwire into a right M1/MCA middle division branch. A 6 mm solitaire stent retriever was deployed spanning the right M1/MCA and proximal M2/middle division branch. Device was allowed to  intercalated with the clot for 4 minutes. The microcatheter was removed under fluoroscopy. The guiding catheter balloon was  inflated at the level of the proximal petrous segment. Thrombectomy device and aspiration catheter were then removed under constant aspiration. Follow-up angiogram showed recanalization of the M1 segment with subocclusive filling defect and slow flow in distal MCA branches (TICI 2C). A large bore aspiration catheter was again navigated through the walrus balloon guide catheter and over a phenom 21 microcatheter and a synchro support microguidewire into the cavernous segment of the right ICA. The microcatheter was then advanced over the microwire into a right M1/MCA posterior division branch. A 6 mm solitaire stent retriever was deployed spanning the right M1/MCA and proximal M2 posterior division branch. Device was allowed to intercalated with the clot for 4 minutes. The microcatheter was removed under fluoroscopy. The guiding catheter balloon was inflated at the level of the proximal petrous segment. Thrombectomy device and aspiration catheter were then removed under constant aspiration. Follow-up angiogram show normal forward flow into the right MCA vascular territory noting a small dissection flap in the mid/distal right M1/MCA segment. Delayed follow-up angiogram showed progressive reocclusion of the right M1/MCA with minimal forward flow. Flat panel CT of the head was obtained and post processed in a separate workstation under concurrent attending physician supervision. No evidence of hemorrhagic complication noted. Right ICA angiograms with frontal lateral views of the head were obtained. Further progression of occlusion in the right M1 segment was noted. Small non flow limiting iatrogenic dissection in the upper cervical segment of the right ICA identified. The large bore aspiration catheter was navigated through the walrus balloon guide catheter and over a SL 10 microcatheter and a synchro support microguidewire into the cavernous segment of the right ICA. The microcatheter was then advanced over the  microwire into a right M1/MCA posterior division branch. Intra arterial infusion of 19 mg of Integrilin was performed into the right ICA. Subsequently, a 3 mm x 15 mm atlas intracranial stent was deployed in the M1 segment across the dissection flap. Follow-up angiogram showed adequate stent positioning with complete restoration of the anterograde flow in the right MCA. Delayed follow-up angiogram (x2) show no evidence of reocclusion. 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 Postprocedural flat panel CT of the head was obtained and post processed in a separate workstation under concurrent attending physician supervision. No evidence of hemorrhagic complication noted. The catheter system was withdrawn. A right common femoral artery angiogram was obtained showing access at the level of the right common femoral artery. The femoral sheath was removed and the access was closed with a Perclose ProGlide. Immediate hemostasis achieved. 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 FINDINGS: Dissection of the mid/distal right M1/MCA. IMPRESSION: 1. Mechanical thrombectomy performed (x2) with temporary restoration of flow revealing dissection flap in the mid/distal right M1/MCA segment. 2. Intracranial stenting placed in the right M1/MCA across the dissection flap with complete restoration of flow. 3. No hemorrhagic complication on postprocedural flat panel CT. No embolus to new territory. PLAN: Patient is to remain on Integrilin drip. A follow-up head CT will be obtained at 6:30 p.m. today. At this point, patient will be re-evaluated for transition to oral dual anti-platelet therapy. Electronically Signed   By: Pedro Earls M.D.   On: 06/04/2019 17:40   IR CT Head Ltd  Result Date: 06/04/2019 CLINICAL DATA:  59 year old male with past  medical history significant for colon cancer in 2012. He  developed left-sided weakness and neglect on awakening this morning. He was taken to [facility] hospital. Head CT showed small hypodensity in the right insula with no hemorrhage. NIHSS 10. CT angiogram showed tapering of the right M1/MCA with short-segment filling defect. CT perfusion showed an estimated core volume of 25 mL with a mismatch of 126 mL. He was transferred to our service for emergency treatment of the right M1/MCA occlusion. EXAM: IR CT HEAD LIMITED; IR PERCUTANEOUS ART THORMBECTOMY/INFUSION INTRACRANIAL INCLUDE DIAG ANGIO; IR ULTRASOUND GUIDANCE VASC ACCESS RIGHT; INTRACRANIAL STENT (INCL PTA) ANESTHESIA/SEDATION: General anesthesia. MEDICATIONS: Intra arterial Verapamil 5 mg; intra arterial Integrilin 19 mg. CONTRAST:  63m OMNIPAQUE IOHEXOL 300 MG/ML SOLN, 718mOMNIPAQUE IOHEXOL 240 MG/ML SOLN PROCEDURE: Operator: Dr. KaPedro EarlsMD. No healthcare proxy or next of kin available for informed consent. Discussion held with the neurohospitalist attending. Agreement reached that the procedure would be in the best interest of the patient and benefits outweighed risks. The patient was made to lie supine on the angiography table. He was prepped and draped utilizing the usual sterile technique. Using gray scale ultrasound-guided, a micropuncture kit and modified Seldinger technique, access was gained to the right common femoral artery. An 8 French femoral sheath was then placed. Under fluoroscopy, an 8 Fr[place] balloon guide catheter was navigated over a 6 FrPakistanerenstein 2 catheter and a 0.038 inch Terumo Glidewire into the aortic arch. Under fluoroscopy, the catheter was advanced into the right common carotid artery and then into the right internal carotid artery. Frontal and lateral angiograms of the head were obtained showing a distal right M1/MCA occlusion with minimal delayed penetration of contrast. A large bore aspiration catheter was then navigated through the walrus  balloon guide catheter and over a phenom 21 microcatheter and a synchro support microguidewire into the cavernous segment of the right ICA. The microcatheter was then advanced over the microwire into a right M1/MCA middle division branch. A 6 mm solitaire stent retriever was deployed spanning the right M1/MCA and proximal M2/middle division branch. Device was allowed to intercalated with the clot for 4 minutes. The microcatheter was removed under fluoroscopy. The guiding catheter balloon was inflated at the level of the proximal petrous segment. Thrombectomy device and aspiration catheter were then removed under constant aspiration. Follow-up angiogram showed recanalization of the M1 segment with subocclusive filling defect and slow flow in distal MCA branches (TICI 2C). A large bore aspiration catheter was again navigated through the walrus balloon guide catheter and over a phenom 21 microcatheter and a synchro support microguidewire into the cavernous segment of the right ICA. The microcatheter was then advanced over the microwire into a right M1/MCA posterior division branch. A 6 mm solitaire stent retriever was deployed spanning the right M1/MCA and proximal M2 posterior division branch. Device was allowed to intercalated with the clot for 4 minutes. The microcatheter was removed under fluoroscopy. The guiding catheter balloon was inflated at the level of the proximal petrous segment. Thrombectomy device and aspiration catheter were then removed under constant aspiration. Follow-up angiogram show normal forward flow into the right MCA vascular territory noting a small dissection flap in the mid/distal right M1/MCA segment. Delayed follow-up angiogram showed progressive reocclusion of the right M1/MCA with minimal forward flow. Flat panel CT of the head was obtained and post processed in a separate workstation under concurrent attending physician supervision. No evidence of hemorrhagic complication noted. Right  ICA angiograms with frontal  lateral views of the head were obtained. Further progression of occlusion in the right M1 segment was noted. Small non flow limiting iatrogenic dissection in the upper cervical segment of the right ICA identified. The large bore aspiration catheter was navigated through the walrus balloon guide catheter and over a SL 10 microcatheter and a synchro support microguidewire into the cavernous segment of the right ICA. The microcatheter was then advanced over the microwire into a right M1/MCA posterior division branch. Intra arterial infusion of 19 mg of Integrilin was performed into the right ICA. Subsequently, a 3 mm x 15 mm atlas intracranial stent was deployed in the M1 segment across the dissection flap. Follow-up angiogram showed adequate stent positioning with complete restoration of the anterograde flow in the right MCA. Delayed follow-up angiogram (x2) show no evidence of reocclusion. 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 Postprocedural flat panel CT of the head was obtained and post processed in a separate workstation under concurrent attending physician supervision. No evidence of hemorrhagic complication noted. The catheter system was withdrawn. A right common femoral artery angiogram was obtained showing access at the level of the right common femoral artery. The femoral sheath was removed and the access was closed with a Perclose ProGlide. Immediate hemostasis achieved. 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 FINDINGS: Dissection of the mid/distal right M1/MCA. IMPRESSION: 1. Mechanical thrombectomy performed (x2) with temporary restoration of flow revealing dissection flap in the mid/distal right M1/MCA segment. 2. Intracranial stenting placed in the right M1/MCA across the dissection flap with complete restoration of flow. 3. No hemorrhagic complication on  postprocedural flat panel CT. No embolus to new territory. PLAN: Patient is to remain on Integrilin drip. A follow-up head CT will be obtained at 6:30 p.m. today. At this point, patient will be re-evaluated for transition to oral dual anti-platelet therapy. Electronically Signed   By: Pedro Earls M.D.   On: 06/04/2019 17:40   IR CT Head Ltd  Result Date: 06/04/2019 CLINICAL DATA:  59 year old male with past medical history significant for colon cancer in 2012. He developed left-sided weakness and neglect on awakening this morning. He was taken to [facility] hospital. Head CT showed small hypodensity in the right insula with no hemorrhage. NIHSS 10. CT angiogram showed tapering of the right M1/MCA with short-segment filling defect. CT perfusion showed an estimated core volume of 25 mL with a mismatch of 126 mL. He was transferred to our service for emergency treatment of the right M1/MCA occlusion. EXAM: IR CT HEAD LIMITED; IR PERCUTANEOUS ART THORMBECTOMY/INFUSION INTRACRANIAL INCLUDE DIAG ANGIO; IR ULTRASOUND GUIDANCE VASC ACCESS RIGHT; INTRACRANIAL STENT (INCL PTA) ANESTHESIA/SEDATION: General anesthesia. MEDICATIONS: Intra arterial Verapamil 5 mg; intra arterial Integrilin 19 mg. CONTRAST:  55m OMNIPAQUE IOHEXOL 300 MG/ML SOLN, 756mOMNIPAQUE IOHEXOL 240 MG/ML SOLN PROCEDURE: Operator: Dr. KaPedro EarlsMD. No healthcare proxy or next of kin available for informed consent. Discussion held with the neurohospitalist attending. Agreement reached that the procedure would be in the best interest of the patient and benefits outweighed risks. The patient was made to lie supine on the angiography table. He was prepped and draped utilizing the usual sterile technique. Using gray scale ultrasound-guided, a micropuncture kit and modified Seldinger technique, access was gained to the right common femoral artery. An 8 French femoral sheath was then placed. Under fluoroscopy, an 8  Fr[place] balloon guide catheter was navigated  over a 6 [place] Berenstein 2 catheter and a 0.038 inch Terumo Glidewire into the aortic arch. Under fluoroscopy, the catheter was advanced into the right common carotid artery and then into the right internal carotid artery. Frontal and lateral angiograms of the head were obtained showing a distal right M1/MCA occlusion with minimal delayed penetration of contrast. A large bore aspiration catheter was then navigated through the walrus balloon guide catheter and over a phenom 21 microcatheter and a synchro support microguidewire into the cavernous segment of the right ICA. The microcatheter was then advanced over the microwire into a right M1/MCA middle division branch. A 6 mm solitaire stent retriever was deployed spanning the right M1/MCA and proximal M2/middle division branch. Device was allowed to intercalated with the clot for 4 minutes. The microcatheter was removed under fluoroscopy. The guiding catheter balloon was inflated at the level of the proximal petrous segment. Thrombectomy device and aspiration catheter were then removed under constant aspiration. Follow-up angiogram showed recanalization of the M1 segment with subocclusive filling defect and slow flow in distal MCA branches (TICI 2C). A large bore aspiration catheter was again navigated through the walrus balloon guide catheter and over a phenom 21 microcatheter and a synchro support microguidewire into the cavernous segment of the right ICA. The microcatheter was then advanced over the microwire into a right M1/MCA posterior division branch. A 6 mm solitaire stent retriever was deployed spanning the right M1/MCA and proximal M2 posterior division branch. Device was allowed to intercalated with the clot for 4 minutes. The microcatheter was removed under fluoroscopy. The guiding catheter balloon was inflated at the level of the proximal petrous segment. Thrombectomy device and aspiration catheter  were then removed under constant aspiration. Follow-up angiogram show normal forward flow into the right MCA vascular territory noting a small dissection flap in the mid/distal right M1/MCA segment. Delayed follow-up angiogram showed progressive reocclusion of the right M1/MCA with minimal forward flow. Flat panel CT of the head was obtained and post processed in a separate workstation under concurrent attending physician supervision. No evidence of hemorrhagic complication noted. Right ICA angiograms with frontal lateral views of the head were obtained. Further progression of occlusion in the right M1 segment was noted. Small non flow limiting iatrogenic dissection in the upper cervical segment of the right ICA identified. The large bore aspiration catheter was navigated through the walrus balloon guide catheter and over a SL 10 microcatheter and a synchro support microguidewire into the cavernous segment of the right ICA. The microcatheter was then advanced over the microwire into a right M1/MCA posterior division branch. Intra arterial infusion of 19 mg of Integrilin was performed into the right ICA. Subsequently, a 3 mm x 15 mm atlas intracranial stent was deployed in the M1 segment across the dissection flap. Follow-up angiogram showed adequate stent positioning with complete restoration of the anterograde flow in the right MCA. Delayed follow-up angiogram (x2) show no evidence of reocclusion. 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 Postprocedural flat panel CT of the head was obtained and post processed in a separate workstation under concurrent attending physician supervision. No evidence of hemorrhagic complication noted. The catheter system was withdrawn. A right common femoral artery angiogram was obtained showing access at the level of the right common femoral artery. The femoral sheath was removed and the access was closed with a Perclose  ProGlide. Immediate hemostasis achieved. COMPLICATIONS: Small non flow limiting dissection of the upper cervical segment of  the right ICA. FINDINGS: Dissection of the mid/distal right M1/MCA. IMPRESSION: 1. Mechanical thrombectomy performed (x2) with temporary restoration of flow revealing dissection flap in the mid/distal right M1/MCA segment. 2. Intracranial stenting placed in the right M1/MCA across the dissection flap with complete restoration of flow. 3. No hemorrhagic complication on postprocedural flat panel CT. No embolus to new territory. PLAN: Patient is to remain on Integrilin drip. A follow-up head CT will be obtained at 6:30 p.m. today. At this point, patient will be re-evaluated for transition to oral dual anti-platelet therapy. Electronically Signed   By: Pedro Earls M.D.   On: 06/04/2019 17:40   IR US Guide Vasc Access Right  Result Date: 06/04/2019 CLINICAL DATA:  59 year old male with past medical history significant for colon cancer in 2012. He developed left-sided weakness and neglect on awakening this morning. He was taken to [facility] hospital. Head CT showed small hypodensity in the right insula with no hemorrhage. NIHSS 10. CT angiogram showed tapering of the right M1/MCA with short-segment filling defect. CT perfusion showed an estimated core volume of 25 mL with a mismatch of 126 mL. He was transferred to our service for emergency treatment of the right M1/MCA occlusion. EXAM: IR CT HEAD LIMITED; IR PERCUTANEOUS ART THORMBECTOMY/INFUSION INTRACRANIAL INCLUDE DIAG ANGIO; IR ULTRASOUND GUIDANCE VASC ACCESS RIGHT; INTRACRANIAL STENT (INCL PTA) ANESTHESIA/SEDATION: General anesthesia. MEDICATIONS: Intra arterial Verapamil 5 mg; intra arterial Integrilin 19 mg. CONTRAST:  24m OMNIPAQUE IOHEXOL 300 MG/ML SOLN, 727mOMNIPAQUE IOHEXOL 240 MG/ML SOLN PROCEDURE: Operator: Dr. KaPedro EarlsMD. No healthcare proxy or next of kin available for informed  consent. Discussion held with the neurohospitalist attending. Agreement reached that the procedure would be in the best interest of the patient and benefits outweighed risks. The patient was made to lie supine on the angiography table. He was prepped and draped utilizing the usual sterile technique. Using gray scale ultrasound-guided, a micropuncture kit and modified Seldinger technique, access was gained to the right common femoral artery. An 8 French femoral sheath was then placed. Under fluoroscopy, an 8 FrPakistanalrus balloon guide catheter was navigated over a 6 FrPakistanerenstein 2 catheter and a 0.038 inch Terumo Glidewire into the aortic arch. Under fluoroscopy, the catheter was advanced into the right common carotid artery and then into the right internal carotid artery. Frontal and lateral angiograms of the head were obtained showing a distal right M1/MCA occlusion with minimal delayed penetration of contrast. A large bore aspiration catheter was then navigated through the walrus balloon guide catheter and over a phenom 21 microcatheter and a synchro support microguidewire into the cavernous segment of the right ICA. The microcatheter was then advanced over the microwire into a right M1/MCA middle division branch. A 6 mm solitaire stent retriever was deployed spanning the right M1/MCA and proximal M2/middle division branch. Device was allowed to intercalated with the clot for 4 minutes. The microcatheter was removed under fluoroscopy. The guiding catheter balloon was inflated at the level of the proximal petrous segment. Thrombectomy device and aspiration catheter were then removed under constant aspiration. Follow-up angiogram showed recanalization of the M1 segment with subocclusive filling defect and slow flow in distal MCA branches (TICI 2C). A large bore aspiration catheter was again navigated through the walrus balloon guide catheter and over a phenom 21 microcatheter and a synchro support  microguidewire into the cavernous segment of the right ICA. The microcatheter was then advanced over the microwire into a right M1/MCA posterior division branch.  A 6 mm solitaire stent retriever was deployed spanning the right M1/MCA and proximal M2 posterior division branch. Device was allowed to intercalated with the clot for 4 minutes. The microcatheter was removed under fluoroscopy. The guiding catheter balloon was inflated at the level of the proximal petrous segment. Thrombectomy device and aspiration catheter were then removed under constant aspiration. Follow-up angiogram show normal forward flow into the right MCA vascular territory noting a small dissection flap in the mid/distal right M1/MCA segment. Delayed follow-up angiogram showed progressive reocclusion of the right M1/MCA with minimal forward flow. Flat panel CT of the head was obtained and post processed in a separate workstation under concurrent attending physician supervision. No evidence of hemorrhagic complication noted. Right ICA angiograms with frontal lateral views of the head were obtained. Further progression of occlusion in the right M1 segment was noted. Small non flow limiting iatrogenic dissection in the upper cervical segment of the right ICA identified. The large bore aspiration catheter was navigated through the walrus balloon guide catheter and over a SL 10 microcatheter and a synchro support microguidewire into the cavernous segment of the right ICA. The microcatheter was then advanced over the microwire into a right M1/MCA posterior division branch. Intra arterial infusion of 19 mg of Integrilin was performed into the right ICA. Subsequently, a 3 mm x 15 mm atlas intracranial stent was deployed in the M1 segment across the dissection flap. Follow-up angiogram showed adequate stent positioning with complete restoration of the anterograde flow in the right MCA. Delayed follow-up angiogram (x2) show no evidence of reocclusion. Small  non flow-limiting dissection of the upper cervical segment of the right ICA remained stable. Postprocedural flat panel CT of the head was obtained and post processed in a separate workstation under concurrent attending physician supervision. No evidence of hemorrhagic complication noted. The catheter system was withdrawn. A right common femoral artery angiogram was obtained showing access at the level of the right common femoral artery. The femoral sheath was removed and the access was closed with a Perclose ProGlide. Immediate hemostasis achieved. 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 FINDINGS: Dissection of the mid/distal right M1/MCA. IMPRESSION: 1. Mechanical thrombectomy performed (x2) with temporary restoration of flow revealing dissection flap in the mid/distal right M1/MCA segment. 2. Intracranial stenting placed in the right M1/MCA across the dissection flap with complete restoration of flow. 3. No hemorrhagic complication on postprocedural flat panel CT. No embolus to new territory. PLAN: Patient is to remain on Integrilin drip. A follow-up head CT will be obtained at 6:30 p.m. today. At this point, patient will be re-evaluated for transition to oral dual anti-platelet therapy. Electronically Signed   By: Pedro Earls M.D.   On: 06/04/2019 17:40   DG CHEST PORT 1 VIEW  Result Date: 06/05/2019 CLINICAL DATA:  NG tube placement. EXAM: PORTABLE CHEST 1 VIEW COMPARISON:  CT angiogram of the chest dated 09/13/2002 FINDINGS: NG tube tip is below the diaphragm. Heart size and pulmonary vascularity are normal. Lungs are clear. No significant bone abnormality. IMPRESSION: Normal chest.  NG tube tip below the diaphragm. Electronically Signed   By: Lorriane Shire M.D.   On: 06/05/2019 14:29   DG Abd Portable 1V  Result Date: 06/05/2019 CLINICAL DATA:  Nasogastric tube placement. EXAM: PORTABLE ABDOMEN - 1 VIEW COMPARISON:  Same day.  FINDINGS: The bowel gas pattern is normal. Distal tip of nasogastric tube is seen in expected position of the gastroesophageal junction.  No radio-opaque calculi or other significant radiographic abnormality are seen. IMPRESSION: Distal tip of nasogastric tube seen in expected position of gastroesophageal junction. Proximal side hole is seen in distal esophagus. Advancement is recommended. Electronically Signed   By: Marijo Conception M.D.   On: 06/05/2019 12:01   DG Abd Portable 1V  Result Date: 06/05/2019 CLINICAL DATA:  Nasogastric tube placement EXAM: PORTABLE ABDOMEN - 1 VIEW COMPARISON:  Portable exam 1050 hours compared to 0858 hours FINDINGS: Tip of nasogastric tube projects over stomach though the proximal side-port projects over the distal esophagus; recommend advancing tube 5 cm. Nonobstructive bowel gas pattern. No bowel dilatation or bowel wall thickening. Osseous structures unremarkable. IMPRESSION: Proximal side-port of nasogastric tube projects over distal esophagus; recommend advancing tube 5 cm. Electronically Signed   By: Lavonia Dana M.D.   On: 06/05/2019 11:00   DG Abd Portable 1V  Result Date: 06/05/2019 CLINICAL DATA:  NG tube placement EXAM: PORTABLE ABDOMEN - 1 VIEW COMPARISON:  06/04/2019 FINDINGS: Esophagogastric tube remains with tip below the diaphragm and side port above the gastroesophageal junction. General paucity of bowel although scattered gas is gas present to the rectum. No free air on supine radiographs. IMPRESSION: Esophagogastric tube remains with tip below the diaphragm and side port above the gastroesophageal junction. Recommend advancement to ensure subdiaphragmatic positioning of tip and side port. Electronically Signed   By: Eddie Candle M.D.   On: 06/05/2019 09:25   DG Swallowing Func-Speech Pathology  Result Date: 06/06/2019 Objective Swallowing Evaluation: Type of Study: MBS-Modified Barium Swallow Study  Patient Details Name: Scott Day MRN: [id number] Date  of Birth: Apr 20, 1960 Today's Date: 06/06/2019 Time: SLP Start Time (ACUTE ONLY): 1751 -SLP Stop Time (ACUTE ONLY): 1416 SLP Time Calculation (min) (ACUTE ONLY): 19 min Past Medical History: Past Medical History: Diagnosis Date . Cancer [facility]) 2012  colon cancer . Closed fracture of left distal radius  . GERD (gastroesophageal reflux disease)  Past Surgical History: Past Surgical History: Procedure Laterality Date . COLON SURGERY  2012  colon cancer . IR CT HEAD LTD  06/04/2019 . IR CT HEAD LTD  06/04/2019 . IR INTRA CRAN STENT  06/04/2019 . IR PERCUTANEOUS ART THROMBECTOMY/INFUSION INTRACRANIAL INC DIAG ANGIO  06/04/2019 . IR US GUIDE VASC ACCESS RIGHT  06/04/2019 . OPEN REDUCTION INTERNAL FIXATION (ORIF) DISTAL RADIAL FRACTURE Left 09/20/2017  Procedure: OPEN REDUCTION INTERNAL FIXATION (ORIF) DISTAL RADIAL FRACTURE;  Surgeon: Leanora Cover, MD;  Location: [facility];  Service: Orthopedics;  Laterality: Left; . RADIOLOGY WITH ANESTHESIA N/A 06/04/2019  Procedure: IR WITH ANESTHESIA;  Surgeon: Radiologist, Medication, MD;  Location: [facility];  Service: Radiology;  Laterality: N/A; HPI: PEARSON PICOU is a 59 y.o. male with a history of colon cancer in 2012 and GERD, who presents with left-sided weakness. 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  Subjective: cooperative Assessment / Plan / Recommendation CHL IP CLINICAL IMPRESSIONS 06/06/2019 Clinical Impression Patient presents with mild oropharyngeal dysphagia. Oral phase is remarkable for prolonged mastication and lingual residue. Pharyngeal phase is remarkable for reduced epiglottic inversion that leads to reduced laryngeal closure, resulting in penetration (PAS 4 and 5) and vallecular residue. Pt noted with throat clearing during the study following penetration, and was able to clear penetrate the majority of the time. Prior to administering POs, pt was also noted with grunting and throat clearing. Given the  prolonged transit with regular solids d/t missing dentention, recommend Dys 3 and thin liquids, and meds  whole in puree. Ensure the pt is following general aspiration precautions (small bites/sips, upright while eating/drinking).  SLP Visit Diagnosis Dysphagia, oropharyngeal phase (R13.12) Attention and concentration deficit following -- Frontal lobe and executive function deficit following -- Impact on safety and function Mild aspiration risk   CHL IP TREATMENT RECOMMENDATION 06/06/2019 Treatment Recommendations Therapy as outlined in treatment plan below   Prognosis 06/06/2019 Prognosis for Safe Diet Advancement Good Barriers to Reach Goals Cognitive deficits Barriers/Prognosis Comment -- CHL IP DIET RECOMMENDATION 06/06/2019 SLP Diet Recommendations Dysphagia 3 (Mech soft) solids;Thin liquid Liquid Administration via Straw;Cup;Spoon Medication Administration Whole meds with puree Compensations Minimize environmental distractions;Slow rate;Small sips/bites Postural Changes Seated upright at 90 degrees   CHL IP OTHER RECOMMENDATIONS 06/06/2019 Recommended Consults -- Oral Care Recommendations Oral care BID Other Recommendations Have oral suction available   CHL IP FOLLOW UP RECOMMENDATIONS 06/06/2019 Follow up Recommendations Inpatient Rehab   CHL IP FREQUENCY AND DURATION 06/06/2019 Speech Therapy Frequency (ACUTE ONLY) min 2x/week Treatment Duration 2 weeks      CHL IP ORAL PHASE 06/06/2019 Oral Phase Impaired Oral - Pudding Teaspoon -- Oral - Pudding Cup -- Oral - Honey Teaspoon -- Oral - Honey Cup -- Oral - Nectar Teaspoon -- Oral - Nectar Cup -- Oral - Nectar Straw -- Oral - Thin Teaspoon WFL Oral - Thin Cup Lingual/palatal residue Oral - Thin Straw Lingual/palatal residue Oral - Puree Lingual/palatal residue Oral - Mech Soft -- Oral - Regular Impaired mastication Oral - Multi-Consistency -- Oral - Pill WFL Oral Phase - Comment --  CHL IP PHARYNGEAL PHASE 06/06/2019 Pharyngeal Phase Impaired Pharyngeal- Pudding  Teaspoon -- Pharyngeal -- Pharyngeal- Pudding Cup -- Pharyngeal -- Pharyngeal- Honey Teaspoon -- Pharyngeal -- Pharyngeal- Honey Cup -- Pharyngeal -- Pharyngeal- Nectar Teaspoon -- Pharyngeal -- Pharyngeal- Nectar Cup -- Pharyngeal -- Pharyngeal- Nectar Straw -- Pharyngeal -- Pharyngeal- Thin Teaspoon WFL Pharyngeal -- Pharyngeal- Thin Cup Penetration/Aspiration during swallow;Reduced airway/laryngeal closure Pharyngeal Material enters airway, CONTACTS cords and then ejected out Pharyngeal- Thin Straw Reduced airway/laryngeal closure;Penetration/Aspiration during swallow Pharyngeal Material enters airway, CONTACTS cords and then ejected out;Material enters airway, CONTACTS cords and not ejected out Pharyngeal- Puree Pharyngeal residue - valleculae Pharyngeal -- Pharyngeal- Mechanical Soft -- Pharyngeal -- Pharyngeal- Regular Pharyngeal residue - valleculae Pharyngeal -- Pharyngeal- Multi-consistency -- Pharyngeal -- Pharyngeal- Pill WFL Pharyngeal -- Pharyngeal Comment --  CHL IP CERVICAL ESOPHAGEAL PHASE 06/06/2019 Cervical Esophageal Phase WFL Pudding Teaspoon -- Pudding Cup -- Honey Teaspoon -- Honey Cup -- Nectar Teaspoon -- Nectar Cup -- Nectar Straw -- Thin Teaspoon -- Thin Cup -- Thin Straw -- Puree -- Mechanical Soft -- Regular -- Multi-consistency -- Pill -- Cervical Esophageal Comment -- Osie Bond., M.A. [facility] Pager [phone] Office [phone] 06/06/2019, 3:14 PM              ECHOCARDIOGRAM COMPLETE  Result Date: 06/05/2019    ECHOCARDIOGRAM REPORT   Patient Name:   Scott Day Date of Exam: 06/05/2019 Medical Rec #:  [id number]      Height:       71.0 in Accession #:    [id number]     Weight:       231.5 lb Date of Birth:  [birth year]      BSA:          2.244 m Patient Age:    59 years       BP:           126/67 mmHg Patient Gender: M  HR:           70 bpm. Exam Location:  Inpatient Procedure: 2D Echo, Cardiac Doppler and Color Doppler Indications:     Stroke 434.91/I163.9  History:        Patient has no prior history of Echocardiogram examinations.                 Risk Factors:Former Smoker. CVA.  Sonographer:    Clayton Lefort RDCS (AE) Referring Phys: [phone] MCNEILL P [place]  1. Left ventricular ejection fraction, by estimation, is 65 to 70%. The left ventricle has normal function. The left ventricle has no regional wall motion abnormalities. There is severe left ventricular hypertrophy. Left ventricular diastolic parameters  were normal.  2. Right ventricular systolic function is normal. The right ventricular size is normal.  3. The mitral valve is abnormal. Trivial mitral valve regurgitation.  4. The aortic valve is tricuspid. Aortic valve regurgitation is not visualized. No aortic stenosis is present.  5. The inferior vena cava is normal in size with <50% respiratory variability, suggesting right atrial pressure of 8 mmHg. FINDINGS  Left Ventricle: Left ventricular ejection fraction, by estimation, is 65 to 70%. The left ventricle has normal function. The left ventricle has no regional wall motion abnormalities. The left ventricular internal cavity size was normal in size. There is  severe left ventricular hypertrophy. Left ventricular diastolic parameters were normal. Right Ventricle: The right ventricular size is normal. No increase in right ventricular wall thickness. Right ventricular systolic function is normal. Left Atrium: Left atrial size was normal in size. Right Atrium: Right atrial size was normal in size. Pericardium: There is no evidence of pericardial effusion. Mitral Valve: The mitral valve is abnormal. There is mild thickening of the mitral valve leaflet(s). Trivial mitral valve regurgitation. MV peak gradient, 3.6 mmHg. The mean mitral valve gradient is 1.0 mmHg. Tricuspid Valve: The tricuspid valve is grossly normal. Tricuspid valve regurgitation is trivial. Aortic Valve: The aortic valve is tricuspid. Aortic valve regurgitation  is not visualized. No aortic stenosis is present. Pulmonic Valve: The pulmonic valve was grossly normal. Pulmonic valve regurgitation is not visualized. Aorta: The aortic root, ascending aorta, aortic arch and descending aorta are all structurally normal, with no evidence of dilitation or obstruction. Venous: The inferior vena cava is normal in size with less than 50% respiratory variability, suggesting right atrial pressure of 8 mmHg. IAS/Shunts: The interatrial septum was not well visualized.  LEFT VENTRICLE PLAX 2D LVIDd:         4.02 cm  Diastology LVIDs:         2.40 cm  LV e' lateral:   10.40 cm/s LV PW:         1.68 cm  LV E/e' lateral: 9.0 LV IVS:        1.71 cm  LV e' medial:    9.79 cm/s LVOT diam:     2.10 cm  LV E/e' medial:  9.6 LV SV:         80 LV SV Index:   36 LVOT Area:     3.46 cm  RIGHT VENTRICLE             IVC RV Basal diam:  2.29 cm     IVC diam: 1.86 cm RV S prime:     14.10 cm/s TAPSE (M-mode): 2.7 cm LEFT ATRIUM             Index       RIGHT ATRIUM  Index LA diam:        2.70 cm 1.20 cm/m  RA Area:     11.90 cm LA Vol (A2C):   52.4 ml 23.35 ml/m RA Volume:   23.60 ml  10.52 ml/m LA Vol (A4C):   40.7 ml 18.14 ml/m LA Biplane Vol: 47.6 ml 21.21 ml/m  AORTIC VALVE AV Area (Vmean):   3.20 cm AV Area (VTI):     3.24 cm AV Vmean:          88.200 cm/s AV VTI:            0.246 m LVOT Vmax:         118.00 cm/s LVOT Vmean:        81.500 cm/s LVOT VTI:          0.230 m LVOT/AV VTI ratio: 0.93  AORTA Ao Root diam: 3.40 cm Ao Asc diam:  3.60 cm MITRAL VALVE MV Area (PHT): 3.27 cm    SHUNTS MV Peak grad:  3.6 mmHg    Systemic VTI:  0.23 m MV Mean grad:  1.0 mmHg    Systemic Diam: 2.10 cm MV Vmax:       0.94 m/s MV Vmean:      56.1 cm/s MV Decel Time: 232 msec MV E velocity: 94.10 cm/s MV A velocity: 80.00 cm/s MV E/A ratio:  1.18 Lyman Bishop MD Electronically signed by Lyman Bishop MD Signature Date/Time: 06/05/2019/11:23:20 AM    Final    IR PERCUTANEOUS ART THROMBECTOMY/INFUSION  INTRACRANIAL INC DIAG ANGIO  Result Date: 06/04/2019 CLINICAL DATA:  59 year old male with past medical history significant for colon cancer in 2012. He developed left-sided weakness and neglect on awakening this morning. He was taken to [facility] hospital. Head CT showed small hypodensity in the right insula with no hemorrhage. NIHSS 10. CT angiogram showed tapering of the right M1/MCA with short-segment filling defect. CT perfusion showed an estimated core volume of 25 mL with a mismatch of 126 mL. He was transferred to our service for emergency treatment of the right M1/MCA occlusion. EXAM: IR CT HEAD LIMITED; IR PERCUTANEOUS ART THORMBECTOMY/INFUSION INTRACRANIAL INCLUDE DIAG ANGIO; IR ULTRASOUND GUIDANCE VASC ACCESS RIGHT; INTRACRANIAL STENT (INCL PTA) ANESTHESIA/SEDATION: General anesthesia. MEDICATIONS: Intra arterial Verapamil 5 mg; intra arterial Integrilin 19 mg. CONTRAST:  68m OMNIPAQUE IOHEXOL 300 MG/ML SOLN, 758mOMNIPAQUE IOHEXOL 240 MG/ML SOLN PROCEDURE: Operator: Dr. KaPedro EarlsMD. No healthcare proxy or next of kin available for informed consent. Discussion held with the neurohospitalist attending. Agreement reached that the procedure would be in the best interest of the patient and benefits outweighed risks. The patient was made to lie supine on the angiography table. He was prepped and draped utilizing the usual sterile technique. Using gray scale ultrasound-guided, a micropuncture kit and modified Seldinger technique, access was gained to the right common femoral artery. An 8 French femoral sheath was then placed. Under fluoroscopy, an 8 FrPakistanalrus balloon guide catheter was navigated over a 6 FrPakistanerenstein 2 catheter and a 0.038 inch Terumo Glidewire into the aortic arch. Under fluoroscopy, the catheter was advanced into the right common carotid artery and then into the right internal carotid artery. Frontal and lateral angiograms of the head were obtained  showing a distal right M1/MCA occlusion with minimal delayed penetration of contrast. A large bore aspiration catheter was then navigated through the walrus balloon guide catheter and over a phenom 21 microcatheter and a synchro support microguidewire into the cavernous segment of the right  ICA. The microcatheter was then advanced over the microwire into a right M1/MCA middle division branch. A 6 mm solitaire stent retriever was deployed spanning the right M1/MCA and proximal M2/middle division branch. Device was allowed to intercalated with the clot for 4 minutes. The microcatheter was removed under fluoroscopy. The guiding catheter balloon was inflated at the level of the proximal petrous segment. Thrombectomy device and aspiration catheter were then removed under constant aspiration. Follow-up angiogram showed recanalization of the M1 segment with subocclusive filling defect and slow flow in distal MCA branches (TICI 2C). A large bore aspiration catheter was again navigated through the walrus balloon guide catheter and over a phenom 21 microcatheter and a synchro support microguidewire into the cavernous segment of the right ICA. The microcatheter was then advanced over the microwire into a right M1/MCA posterior division branch. A 6 mm solitaire stent retriever was deployed spanning the right M1/MCA and proximal M2 posterior division branch. Device was allowed to intercalated with the clot for 4 minutes. The microcatheter was removed under fluoroscopy. The guiding catheter balloon was inflated at the level of the proximal petrous segment. Thrombectomy device and aspiration catheter were then removed under constant aspiration. Follow-up angiogram show normal forward flow into the right MCA vascular territory noting a small dissection flap in the mid/distal right M1/MCA segment. Delayed follow-up angiogram showed progressive reocclusion of the right M1/MCA with minimal forward flow. Flat panel CT of the head was  obtained and post processed in a separate workstation under concurrent attending physician supervision. No evidence of hemorrhagic complication noted. Right ICA angiograms with frontal lateral views of the head were obtained. Further progression of occlusion in the right M1 segment was noted. Small non flow limiting iatrogenic dissection in the upper cervical segment of the right ICA identified. The large bore aspiration catheter was navigated through the walrus balloon guide catheter and over a SL 10 microcatheter and a synchro support microguidewire into the cavernous segment of the right ICA. The microcatheter was then advanced over the microwire into a right M1/MCA posterior division branch. Intra arterial infusion of 19 mg of Integrilin was performed into the right ICA. Subsequently, a 3 mm x 15 mm atlas intracranial stent was deployed in the M1 segment across the dissection flap. Follow-up angiogram showed adequate stent positioning with complete restoration of the anterograde flow in the right MCA. Delayed follow-up angiogram (x2) show no evidence of reocclusion. 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 Postprocedural flat panel CT of the head was obtained and post processed in a separate workstation under concurrent attending physician supervision. No evidence of hemorrhagic complication noted. The catheter system was withdrawn. A right common femoral artery angiogram was obtained showing access at the level of the right common femoral artery. The femoral sheath was removed and the access was closed with a Perclose ProGlide. Immediate hemostasis achieved. 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 FINDINGS: Dissection of the mid/distal right M1/MCA. IMPRESSION: 1. Mechanical thrombectomy performed (x2) with temporary restoration of flow revealing dissection flap in the mid/distal right M1/MCA  segment. 2. Intracranial stenting placed in the right M1/MCA across the dissection flap with complete restoration of flow. 3. No hemorrhagic complication on postprocedural flat panel CT. No embolus to new territory. PLAN: Patient is to remain on Integrilin drip. A follow-up head CT will be obtained at 6:30 p.m. today. At this point, patient will be re-evaluated for transition to  oral dual anti-platelet therapy. Electronically Signed   By: Pedro Earls M.D.   On: 06/04/2019 17:40   VAS [place] LOWER EXTREMITY VENOUS (DVT)  Result Date: 06/06/2019  Lower Venous DVTStudy Indications: Stroke.  Comparison Study: No prior study on file Performing Technologist: Sharion Dove RVS  Examination Guidelines: A complete evaluation includes B-mode imaging, spectral Doppler, color Doppler, and power Doppler as needed of all accessible portions of each vessel. Bilateral testing is considered an integral part of a complete examination. Limited examinations for reoccurring indications may be performed as noted. The reflux portion of the exam is performed with the patient in reverse Trendelenburg.  +---------+---------------+---------+-----------+----------+--------------+ RIGHT    CompressibilityPhasicitySpontaneityPropertiesThrombus Aging +---------+---------------+---------+-----------+----------+--------------+ CFV      Full           Yes      Yes                                 +---------+---------------+---------+-----------+----------+--------------+ SFJ      Full                                                        +---------+---------------+---------+-----------+----------+--------------+ FV Prox  Full                                                        +---------+---------------+---------+-----------+----------+--------------+ FV Mid   Full                                                        +---------+---------------+---------+-----------+----------+--------------+  FV DistalFull                                                        +---------+---------------+---------+-----------+----------+--------------+ PFV      Full                                                        +---------+---------------+---------+-----------+----------+--------------+ POP      Full           Yes      Yes                                 +---------+---------------+---------+-----------+----------+--------------+ PTV      Full                                                        +---------+---------------+---------+-----------+----------+--------------+  PERO     Full                                                        +---------+---------------+---------+-----------+----------+--------------+   +---------+---------------+---------+-----------+----------+--------------+ LEFT     CompressibilityPhasicitySpontaneityPropertiesThrombus Aging +---------+---------------+---------+-----------+----------+--------------+ CFV      Full           Yes      Yes                                 +---------+---------------+---------+-----------+----------+--------------+ SFJ      Full                                                        +---------+---------------+---------+-----------+----------+--------------+ FV Prox  Full                                                        +---------+---------------+---------+-----------+----------+--------------+ FV Mid   Full                                                        +---------+---------------+---------+-----------+----------+--------------+ FV DistalFull                                                        +---------+---------------+---------+-----------+----------+--------------+ PFV      Full                                                        +---------+---------------+---------+-----------+----------+--------------+ POP      Full           Yes      Yes                                  +---------+---------------+---------+-----------+----------+--------------+ PTV      Full                                                        +---------+---------------+---------+-----------+----------+--------------+ PERO     Full                                                        +---------+---------------+---------+-----------+----------+--------------+  Summary: BILATERAL: - No evidence of deep vein thrombosis seen in the lower extremities, bilaterally.   *See table(s) above for measurements and observations. Electronically signed by Curt Jews MD on 06/06/2019 at 3:51:27 PM.    Final     Labs:  CBC: Recent Labs    06/05/19 0543 06/06/19 0522 06/07/19 0414 06/08/19 0345  WBC 8.0 7.6 6.8 7.3  HGB 14.6 14.8 14.6 14.8  HCT 42.8 44.4 42.9 43.5  PLT 212 200 211 219    COAGS: Recent Labs    06/04/19 1125  INR 1.1  APTT 31    BMP: Recent Labs    06/05/19 0543 06/06/19 0522 06/07/19 0414 06/08/19 0345  NA 140 139 141 142  K 3.8 3.3* 3.5 3.5  CL 108 107 109 109  CO2 24 21* 23 22  GLUCOSE 144* 121* 115* 128*  BUN 5* '6 6 8  '$ CALCIUM 8.6* 8.7* 8.7* 9.0  CREATININE 0.89 0.86 0.85 0.85  GFRNONAA >60 >60 >60 >60  GFRAA >60 >60 >60 >60    LIVER FUNCTION TESTS: Recent Labs    06/04/19 1125  BILITOT 0.9  AST 19  ALT 24  ALKPHOS 74  PROT 6.9  ALBUMIN 3.9    Assessment and Plan: History of acute CVA s/p cerebral arteriogram with emergent mechanical thrombectomy and stent placement of mid/distal right MCA M1 occlusion achieving a TICI 3 revascularization 06/04/2019 by Dr. Karenann Cai Patient stable on exam today.  Continues with left-sided deficits and neglect.  Remains disinhibited, although daughter reports some component of this at baseline.  Follow up in place with Dr. Karenann Cai in clinic in 3 months, and for repeat image-guided diagnostic cerebral arteriogram in 6 months.   Patient should remain on  Brilinta '90mg'$  BID x 6 months.  Spoke with rehab admissions coordinator to ensure this was going to be feasible for patient during his inpatient rehab stay and through discharge.   Electronically Signed: Docia Barrier, PA 06/08/2019, 12:35 PM   I spent a total of 15 Minutes at the the patient's bedside AND on the patient's hospital floor or unit, greater than 50% of which was counseling/coordinating care for R MCA CVA.

## 2019-06-08 NOTE — Progress Notes (Signed)
  Echocardiogram Echocardiogram Transesophageal has been performed.  Scott Day 06/08/2019, 9:47 AM

## 2019-06-08 NOTE — Progress Notes (Signed)
Physical Therapy Treatment Patient Details Name: Scott Day MRN: [id number] DOB: Aug 01, 1960 Today's Date: 06/08/2019    History of Present Illness 59 y.o. male with a history of colon cancer in 2012 and GERD, who presents with left-sided weakness. 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     PT Comments    Pt continues to present with L sided inattention and impulsivity.  He did have TEE earlier today and may have been more lethargic than normal.  He was able to ambulate short community distances but required mod cues for safety and to scan left for obstacles.  Pt remains high fall risk and would benefit from CIR at d/c.     Follow Up Recommendations  CIR;Supervision/Assistance - 24 hour     Equipment Recommendations  None recommended by PT    Recommendations for Other Services Rehab consult     Precautions / Restrictions Precautions Precautions: Fall;Other (comment) Precaution Comments: Inattention of left Restrictions Weight Bearing Restrictions: No    Mobility  Bed Mobility   Bed Mobility: Supine to Sit;Sit to Supine Rolling: Supervision   Supine to sit: Supervision Sit to supine: Supervision   General bed mobility comments: cues for safety  Transfers Overall transfer level: Needs assistance Equipment used: None Transfers: Sit to/from Stand Sit to Stand: Supervision         General transfer comment: cues for safety  Ambulation/Gait Ambulation/Gait assistance: Min guard Gait Distance (Feet): 150 Feet Assistive device: None Gait Pattern/deviations: Step-through pattern;Drifts right/left     General Gait Details: Pt demonstrating impulsivity with gait requiring cues for safety.  Required cues to look left to avoid objects in tight areas.  Demonstrating poor safety with 3 LOB due to decreased L foot clearance (cued to correct) but recovered independenlty.  Pt was able to state room number and go in correct  direction but was unable to find his room on left- passed room and still unable to find on right when he turned around.   Stairs Stairs: Yes Stairs assistance: Min guard Stair Management: Two rails;Step to pattern Number of Stairs: 4 General stair comments: cues for safety   Wheelchair Mobility    Modified Rankin (Stroke Patients Only) Modified Rankin (Stroke Patients Only) Pre-Morbid Rankin Score: No symptoms Modified Rankin: Moderately severe disability     Balance Overall balance assessment: Needs assistance Sitting-balance support: No upper extremity supported;Feet supported Sitting balance-Leahy Scale: Good Sitting balance - Comments: supervision due to impulsivity   Standing balance support: No upper extremity supported;During functional activity Standing balance-Leahy Scale: Poor Standing balance comment: minA                            Cognition Arousal/Alertness: Awake/alert Behavior During Therapy: Impulsive Overall Cognitive Status: Impaired/Different from baseline Area of Impairment: Attention;Following commands;Safety/judgement;Awareness;Problem solving                     Memory: Decreased recall of precautions;Decreased short-term memory Following Commands: Follows one step commands consistently;Follows multi-step commands inconsistently Safety/Judgement: Decreased awareness of safety;Decreased awareness of deficits   Problem Solving: Slow processing;Difficulty sequencing;Requires verbal cues;Requires tactile cues General Comments: Pt continues to be impulsive and poor insight into deficits and inappropriate comments at times.  Required mod cues to attend to L side during gait.      Exercises      General Comments        Pertinent Vitals/Pain Pain Assessment: No/denies  pain    Home Living                      Prior Function            PT Goals (current goals can now be found in the care plan section) Progress  towards PT goals: Progressing toward goals    Frequency    Min 4X/week      PT Plan Current plan remains appropriate    Co-evaluation              AM-PAC PT "6 Clicks" Mobility   Outcome Measure  Help needed turning from your back to your side while in a flat bed without using bedrails?: None Help needed moving from lying on your back to sitting on the side of a flat bed without using bedrails?: None Help needed moving to and from a bed to a chair (including a wheelchair)?: None Help needed standing up from a chair using your arms (e.g., wheelchair or bedside chair)?: None Help needed to walk in hospital room?: A Little Help needed climbing 3-5 steps with a railing? : A Little 6 Click Score: 22    End of Session Equipment Utilized During Treatment: Gait belt Activity Tolerance: Patient tolerated treatment well Patient left: in bed;with call bell/phone within reach;with bed alarm set;with family/visitor present Nurse Communication: Mobility status PT Visit Diagnosis: Unsteadiness on feet (R26.81);Other abnormalities of gait and mobility (R26.89);Hemiplegia and hemiparesis Hemiplegia - Right/Left: Left Hemiplegia - dominant/non-dominant: Non-dominant Hemiplegia - caused by: Cerebral infarction     Time: [id number] PT Time Calculation (min) (ACUTE ONLY): 15 min  Charges:  $Gait Training: 8-22 mins                     Maggie Font, PT Acute Rehab Services Pager [phone] [facility] [phone] [facility] [phone]    Karlton Lemon 06/08/2019, 4:22 PM

## 2019-06-08 NOTE — CV Procedure (Signed)
    TRANSESOPHAGEAL ECHOCARDIOGRAM   NAME:  Scott Day   MRN: [id number] DOB:  09/03/1960   ADMIT DATE: 06/04/2019  INDICATIONS: CVA  PROCEDURE:   Informed consent was obtained prior to the procedure. The risks, benefits and alternatives for the procedure were discussed and the patient comprehended these risks.  Risks include, but are not limited to, cough, sore throat, vomiting, nausea, somnolence, esophageal and stomach trauma or perforation, bleeding, low blood pressure, aspiration, pneumonia, infection, trauma to the teeth and death.    Procedural time out performed. Procedure was performed under monitored anesthesia care, under the supervision of Dr. Linna Caprice. Patient received 60 mg lidocaine and 345 mg of propofol.   The transesophageal probe was inserted in the esophagus and stomach without difficulty and multiple views were obtained.    COMPLICATIONS:    There were no immediate complications.  FINDINGS:  LEFT VENTRICLE: EF = 65%. No regional wall motion abnormalities.  RIGHT VENTRICLE: Normal size and function.   LEFT ATRIUM: No thrombus/mass.  LEFT ATRIAL APPENDAGE: No thrombus/mass.   RIGHT ATRIUM: No thrombus/mass.  AORTIC VALVE:  Trileaflet. No regurgitation. No vegetation.  MITRAL VALVE:    Normal structure. Trivial regurgitation. No vegetation.  TRICUSPID VALVE: Normal structure. Trivial regurgitation. No vegetation.  PULMONIC VALVE: Grossly normal structure. Trivial regurgitation. No apparent vegetation.  INTERATRIAL SEPTUM: No PFO or ASD seen by color Doppler. Agitated saline contrast used, negative for intra-atrial right to left shunt.  PERICARDIUM: No effusion noted.  DESCENDING AORTA: Mild diffuse plaque seen   CONCLUSION: No cardiac source of embolism. Negative for PFO or right to left shunt.   Buford Dresser, MD, PhD [facility]  [address] [place], [place] [address] [phone]   9:33 AM

## 2019-06-08 NOTE — Progress Notes (Signed)
Inpatient Rehab Admissions Coordinator:   Met with pt and his daughter at bedside.  I do not have a bed available for this patient today.  Will follow up this afternoon with potential availability for possible admission tomorrow.   Shann Medal, PT, DPT Admissions Coordinator [phone] 06/08/19  12:37 PM

## 2019-06-08 NOTE — Consult Note (Addendum)
ELECTROPHYSIOLOGY CONSULT NOTE  Patient ID: Scott Day MRN: [id number], DOB/AGE: [id number]   Admit date: 06/04/2019 Date of Consult: 06/08/2019  Primary Physician: Patient, No Pcp Per Primary Cardiologist: none Reason for Consultation: Cryptogenic stroke - recommendations regarding Implantable Loop Recorder, requested by Dr. Erlinda Hong  History of Present Illness [name] was admitted on 06/04/2019 with stroke, initially [facility] with L sided weakness. Out of window for tPA. CTA w/ R M1 occlusion w/ penumbra, underwent  Mechanical thrombectomy w/ TICI2c revascularization. Dissection flap at site of occlusion. Cerebral angio w/ reocclusion. R M1 stent placed w/ TICI3 flow.    PMHx noted only for prior colon cancer  Neurology noted  R MCA territory infarct with right M1 distal occlusion s/p IR with R M1 stent placement, embolic secondary to unknown source.  he has undergone workup for stroke including echocardiogram and carotid angio.  The patient has been monitored on telemetry which has demonstrated sinus rhythm with no arrhythmias.  Inpatient stroke work-up is to be completed with a TEE.   Echocardiogram this admission demonstrated  IMPRESSIONS  1. Left ventricular ejection fraction, by estimation, is 65 to 70%. The  left ventricle has normal function. The left ventricle has no regional  wall motion abnormalities. There is severe left ventricular hypertrophy.  Left ventricular diastolic parameters  were normal.  2. Right ventricular systolic function is normal. The right ventricular  size is normal.  3. The mitral valve is abnormal. Trivial mitral valve regurgitation.  4. The aortic valve is tricuspid. Aortic valve regurgitation is not  visualized. No aortic stenosis is present.  5. The inferior vena cava is normal in size with <50% respiratory  variability, suggesting right atrial pressure of 8 mmHg.    Lab work is reviewed.   Prior to  admission, the patient denies chest pain, shortness of breath, dizziness, palpitations, or syncope.  He is recovering from their stroke with plans to CIR at discharge.      Past Medical History:  Diagnosis Date  . Cancer [facility]) 2012   colon cancer  . Closed fracture of left distal radius   . GERD (gastroesophageal reflux disease)      Surgical History:  Past Surgical History:  Procedure Laterality Date  . COLON SURGERY  2012   colon cancer  . IR CT HEAD LTD  06/04/2019  . IR CT HEAD LTD  06/04/2019  . IR INTRA CRAN STENT  06/04/2019  . IR PERCUTANEOUS ART THROMBECTOMY/INFUSION INTRACRANIAL INC DIAG ANGIO  06/04/2019  . IR US GUIDE VASC ACCESS RIGHT  06/04/2019  . OPEN REDUCTION INTERNAL FIXATION (ORIF) DISTAL RADIAL FRACTURE Left 09/20/2017   Procedure: OPEN REDUCTION INTERNAL FIXATION (ORIF) DISTAL RADIAL FRACTURE;  Surgeon: Leanora Cover, MD;  Location: [place];  Service: Orthopedics;  Laterality: Left;  . RADIOLOGY WITH ANESTHESIA N/A 06/04/2019   Procedure: IR WITH ANESTHESIA;  Surgeon: Radiologist, Medication, MD;  Location: [place];  Service: Radiology;  Laterality: N/A;     Medications Prior to Admission  Medication Sig Dispense Refill Last Dose  . HYDROcodone-acetaminophen (NORCO) 5-325 MG tablet 1-2 tabs po q6 hours prn pain (Patient not taking: Reported on 06/04/2019) 30 tablet 0     Inpatient Medications:  .  stroke: mapping our early stages of recovery book   Does not apply Once  . aspirin  81 mg Oral Daily  . atorvastatin  80 mg Oral q1800  . chlorhexidine  15 mL Mouth Rinse BID  .  Chlorhexidine Gluconate Cloth  6 each Topical Daily  . feeding supplement (ENSURE ENLIVE)  237 mL Oral BID BM  . insulin aspart  0-15 Units Subcutaneous Q4H  . living well with diabetes book   Does not apply Once  . mouth rinse  15 mL Mouth Rinse q12n4p  . ticagrelor  90 mg Oral BID    Allergies: No Known Allergies  Social History   Socioeconomic History  . Marital  status: Married    Spouse name: Not on file  . Number of children: Not on file  . Years of education: Not on file  . Highest education level: Not on file  Occupational History  . Not on file  Tobacco Use  . Smoking status: Former Research scientist (life sciences)  . Smokeless tobacco: Never Used  Substance and Sexual Activity  . Alcohol use: Yes    Comment: social  . Drug use: Never  . Sexual activity: Not on file  Other Topics Concern  . Not on file  Social History Narrative  . Not on file   Social Determinants of Health   Financial Resource Strain:   . Difficulty of Paying Living Expenses:   Food Insecurity:   . Worried About Charity fundraiser in the Last Year:   . Arboriculturist in the Last Year:   Transportation Needs:   . Film/video editor (Medical):   Marland Kitchen Lack of Transportation (Non-Medical):   Physical Activity:   . Days of Exercise per Week:   . Minutes of Exercise per Session:   Stress:   . Feeling of Stress :   Social Connections:   . Frequency of Communication with Friends and Family:   . Frequency of Social Gatherings with Friends and Family:   . Attends Religious Services:   . Active Member of Clubs or Organizations:   . Attends Archivist Meetings:   Marland Kitchen Marital Status:   Intimate Partner Violence:   . Fear of Current or Ex-Partner:   . Emotionally Abused:   Marland Kitchen Physically Abused:   . Sexually Abused:      History reviewed. No cardiac family history.    Review of Systems: All other systems reviewed and are otherwise negative except as noted above.  Physical Exam: Vitals:   06/07/19 2020 06/07/19 2350 06/08/19 0315 06/08/19 0700  BP: (!) 151/76 128/64 123/87 125/67  Pulse: 79 65 74 82  Resp: 19 18 18 20   Temp: 98 F (36.7 C)  97.8 F (36.6 C) (!) 97.2 F (36.2 C)  TempSrc: Oral  Oral Oral  SpO2: 94% 92% 97% 98%  Weight:      Height:        GEN- The patient is well appearing, alert and oriented x 4 today.   Head- normocephalic, atraumatic Eyes-   Sclera clear, conjunctiva pink Ears- hearing intact Oropharynx- clear Neck- supple Lungs- CTA b/l, normal work of breathing Heart- RRR, no murmurs, rubs or gallops  GI- soft, NT, ND Extremities- no clubbing, cyanosis, or edema MS- no significant deformity or atrophy Skin- no rash or lesion Psych- pleasent   Labs:   Lab Results  Component Value Date   WBC 7.3 06/08/2019   HGB 14.8 06/08/2019   HCT 43.5 06/08/2019   MCV 93.1 06/08/2019   PLT 219 06/08/2019    Recent Labs  Lab 06/04/19 1125 06/05/19 0543 06/08/19 0345  NA 133*   < > 142  K 4.0   < > 3.5  CL 98   < >  109  CO2 26   < > 22  BUN 7   < > 8  CREATININE 0.91   < > 0.85  CALCIUM 8.8*   < > 9.0  PROT 6.9  --   --   BILITOT 0.9  --   --   ALKPHOS 74  --   --   ALT 24  --   --   AST 19  --   --   GLUCOSE 213*   < > 128*   < > = values in this interval not displayed.   No results found for: CKTOTAL, CKMB, CKMBINDEX, TROPONINI Lab Results  Component Value Date   CHOL 180 06/05/2019   Lab Results  Component Value Date   HDL 28 (L) 06/05/2019   Lab Results  Component Value Date   LDLCALC 126 (H) 06/05/2019   Lab Results  Component Value Date   TRIG 131 06/05/2019   Lab Results  Component Value Date   CHOLHDL 6.4 06/05/2019   No results found for: LDLDIRECT  No results found for: DDIMER   Radiology/Studies:   CT ANGIO HEAD W OR WO CONTRAST Addendum Date: 06/04/2019   ADDENDUM REPORT: 06/04/2019 12:21 ADDENDUM: Contrast dose is 100 mL Omnipaque 350 Electronically Signed   By: Macy Mis M.D.   On: 06/04/2019 12:21   Result Date: 06/04/2019 CLINICAL DATA:  Code stroke.  Slurred speech and left facial droop EXAM: CT ANGIOGRAPHY HEAD AND NECK CT PERFUSION BRAIN TECHNIQUE: Multidetector CT imaging of the head and neck was performed using the standard protocol during bolus administration of intravenous contrast. Multiplanar CT image reconstructions and MIPs were obtained to evaluate the vascular  anatomy. Carotid stenosis measurements (when applicable) are obtained utilizing NASCET criteria, using the distal internal carotid diameter as the denominator. Multiphase CT imaging of the brain was performed following IV bolus contrast injection. Subsequent parametric perfusion maps were calculated using RAPID software. COMPARISON:  None. FINDINGS: CT HEAD Brain: There is no acute intracranial hemorrhage mass effect edema. Gray-white differentiation is preserved. Ventricles and sulci normal in size and configuration. There is no extra-axial fluid collection. Vascular: Hyperdense vessel. Skull: Unremarkable. Sinuses/Orbits: Lobular mucosal thickening with greatest involvement of the right maxillary sinus. No acute orbital finding. Other: Mastoid air cells are clear ASPECTS [facility] Stroke Program Early CT Score) - Ganglionic level infarction (caudate, lentiform nuclei, internal capsule, insula, M1-M3 cortex): 7 - Supraganglionic infarction (M4-M6 cortex): 3 Total score (0-10 with 10 being normal): 10 Review of the MIP images confirms the above findings CTA NECK Aortic arch: Great vessel origins are patent. Right carotid system: Patent. There is trace calcified plaque at the ICA origin measurable stenosis. Left carotid system: Patent. Eccentric noncalcified plaque causing less than 50% stenosis. Mild calcified plaque at the ICA origin causing less than 50% stenosis. Vertebral arteries: Patent.  Left vertebral artery dominant. Skeleton: Cervical spine degenerative changes. Other neck: No mass or adenopathy Upper chest: No apical lung mass. Review of the MIP images confirms the above findings CTA HEAD Anterior circulation: Intracranial internal carotid arteries patent. There is nonocclusive thrombus within distal right M1 MCA extending to the bifurcation. Left middle cerebral artery is patent. Anterior cerebral arteries are patent. Congenitally absent right A1 ACA. Posterior circulation: Intracranial vertebral  arteries, basilar artery, and posterior cerebral arteries are patent. There are bilateral patent posterior communicating arteries. Venous sinuses: As permitted by contrast timing, patent. Review of the MIP images confirms the above findings CT Brain Perfusion: CBF (<30%) Volume: 66m Perfusion (  Tmax>6.0s) volume: 1101m Mismatch Volume: 1229mInfarction Location: Right MCA territory IMPRESSION: No acute intracranial hemorrhage or evidence of acute infarction. ASPECT score is 10. 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 Perfusion imaging demonstrates 25 mL core infarction and 126 mL territory at risk in the right MCA territory. No hemodynamically significant stenosis in the neck. These results were called by telephone at the time of interpretation on 06/04/2019 at 11:53 am to provider [facility] LEAlexis Goodell who verbally acknowledged these results. Electronically Signed: By: PrMacy Mis.D. On: 06/04/2019 11:59     CT HEAD WO CONTRAST Result Date: 06/05/2019 CLINICAL DATA:  Follow-up stroke.  Right MCA territory infarctions. EXAM: CT HEAD WITHOUT CONTRAST TECHNIQUE: Contiguous axial images were obtained from the base of the skull through the vertex without intravenous contrast. COMPARISON:  Multiple CT studies over the last 2 days. FINDINGS: Brain: Patchy low-density in the right middle cerebral artery territory continues to become more evident by CT consistent with multiple infarctions in that region. These are notable in the insula, right lateral and posterior temporal lobe, right frontal operculum and right frontal parietal junction. No significant swelling, mass effect or any detectable hemorrhage. Right MCA stent as seen previously. Vascular: There is atherosclerotic calcification of the major vessels at the base of the brain. Right MCA stent as seen previously. Skull: Negative Sinuses/Orbits: Chronic sinusitis particularly affecting the right  maxillary sinus. Orbits negative. Other: None IMPRESSION: Multiple infarctions in the right middle cerebral artery territory becoming more evident on CT, with increasing low density. No evidence of significant mass effect or any hemorrhage. Electronically Signed   By: MaNelson Chimes.D.   On: 06/05/2019 15:38     IR Intra Cran Stent Result Date: 06/04/2019 CLINICAL DATA:  [age]ear old male with past medical history significant for colon cancer in 2012. He developed left-sided weakness and neglect on awakening this morning. [facility] showed small hypodensity in the right insula with no hemorrhage. NIHSS 10. CT angiogram showed tapering of the right M1/MCA with short-segment filling defect. CT perfusion showed an estimated core volume of 25 mL with a mismatch of 126 mL. He was transferred to our service for emergency treatment of the right M1/MCA occlusion. EXAM: IR CT HEAD LIMITED; IR PERCUTANEOUS ART THORMBECTOMY/INFUSION INTRACRANIAL INCLUDE DIAG ANGIO; IR ULTRASOUND GUIDANCE VASC ACCESS RIGHT; INTRACRANIAL STENT (INCL PTA) ANESTHESIA/SEDATION: General anesthesia. MEDICATIONS: Intra arterial Verapamil 5 mg; intra arterial Integrilin 19 mg. CONTRAST:  70m21mMNIPAQUE IOHEXOL 300 MG/ML SOLN, 770m69mNIPAQUE IOHEXOL 240 MG/ML SOLN PROCEDURE: Operator: Dr. KatyPedro Earls. No healthcare proxy or next of kin available for informed consent. Discussion held with the neurohospitalist attending. Agreement reached that the procedure would be in the best interest of the patient and benefits outweighed risks. The patient was made to lie supine on the angiography table. He was prepped and draped utilizing the usual sterile technique. Using gray scale ultrasound-guided, a micropuncture kit and modified Seldinger technique, access was gained to the right common femoral artery. An 8 French femoral sheath was then placed. Under fluoroscopy, an 8 Fren[place]rus balloon guide  catheter was navigated over a 6 Fren[place]enstein 2 catheter and a 0.038 inch Terumo Glidewire into the aortic arch. Under fluoroscopy, the catheter was advanced into the right common carotid artery and then into the right internal carotid artery. Frontal and lateral angiograms of the head were obtained showing a distal right M1/MCA occlusion  with minimal delayed penetration of contrast. A large bore aspiration catheter was then navigated through the walrus balloon guide catheter and over a phenom 21 microcatheter and a synchro support microguidewire into the cavernous segment of the right ICA. The microcatheter was then advanced over the microwire into a right M1/MCA middle division branch. A 6 mm solitaire stent retriever was deployed spanning the right M1/MCA and proximal M2/middle division branch. Device was allowed to intercalated with the clot for 4 minutes. The microcatheter was removed under fluoroscopy. The guiding catheter balloon was inflated at the level of the proximal petrous segment. Thrombectomy device and aspiration catheter were then removed under constant aspiration. Follow-up angiogram showed recanalization of the M1 segment with subocclusive filling defect and slow flow in distal MCA branches (TICI 2C). A large bore aspiration catheter was again navigated through the walrus balloon guide catheter and over a phenom 21 microcatheter and a synchro support microguidewire into the cavernous segment of the right ICA. The microcatheter was then advanced over the microwire into a right M1/MCA posterior division branch. A 6 mm solitaire stent retriever was deployed spanning the right M1/MCA and proximal M2 posterior division branch. Device was allowed to intercalated with the clot for 4 minutes. The microcatheter was removed under fluoroscopy. The guiding catheter balloon was inflated at the level of the proximal petrous segment. Thrombectomy device and aspiration catheter were then removed under  constant aspiration. Follow-up angiogram show normal forward flow into the right MCA vascular territory noting a small dissection flap in the mid/distal right M1/MCA segment. Delayed follow-up angiogram showed progressive reocclusion of the right M1/MCA with minimal forward flow. Flat panel CT of the head was obtained and post processed in a separate workstation under concurrent attending physician supervision. No evidence of hemorrhagic complication noted. Right ICA angiograms with frontal lateral views of the head were obtained. Further progression of occlusion in the right M1 segment was noted. Small non flow limiting iatrogenic dissection in the upper cervical segment of the right ICA identified. The large bore aspiration catheter was navigated through the walrus balloon guide catheter and over a SL 10 microcatheter and a synchro support microguidewire into the cavernous segment of the right ICA. The microcatheter was then advanced over the microwire into a right M1/MCA posterior division branch. Intra arterial infusion of 19 mg of Integrilin was performed into the right ICA. Subsequently, a 3 mm x 15 mm atlas intracranial stent was deployed in the M1 segment across the dissection flap. Follow-up angiogram showed adequate stent positioning with complete restoration of the anterograde flow in the right MCA. Delayed follow-up angiogram (x2) show no evidence of reocclusion. 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 Postprocedural flat panel CT of the head was obtained and post processed in a separate workstation under concurrent attending physician supervision. No evidence of hemorrhagic complication noted. The catheter system was withdrawn. A right common femoral artery angiogram was obtained showing access at the level of the right common femoral artery. The femoral sheath was removed and the access was closed with a Perclose ProGlide. Immediate hemostasis  achieved. 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 FINDINGS: Dissection of the mid/distal right M1/MCA. IMPRESSION: 1. Mechanical thrombectomy performed (x2) with temporary restoration of flow revealing dissection flap in the mid/distal right M1/MCA segment. 2. Intracranial stenting placed in the right M1/MCA across the dissection flap with complete restoration of flow. 3. No hemorrhagic complication on  postprocedural flat panel CT. No embolus to new territory. PLAN: Patient is to remain on Integrilin drip. A follow-up head CT will be obtained at 6:30 p.m. today. At this point, patient will be re-evaluated for transition to oral dual anti-platelet therapy. Electronically Signed   By: Pedro Earls M.D.   On: 06/04/2019 17:40     DG CHEST PORT 1 VIEW Result Date: 06/05/2019 CLINICAL DATA:  NG tube placement. EXAM: PORTABLE CHEST 1 VIEW COMPARISON:  CT angiogram of the chest dated 09/13/2002 FINDINGS: NG tube tip is below the diaphragm. Heart size and pulmonary vascularity are normal. Lungs are clear. No significant bone abnormality. IMPRESSION: Normal chest.  NG tube tip below the diaphragm. Electronically Signed   By: Lorriane Shire M.D.   On: 06/05/2019 14:29           VAS [place] LOWER EXTREMITY VENOUS (DVT) Result Date: 06/06/2019  Lower Venous DVTStudy Indications: Stroke.  Comparison Study: No prior study on file Performing Technologist: Sharion Dove RVS  Examination Guidelines: A complete evaluation includes B-mode imaging, spectral Doppler, color Doppler, and power Doppler as needed of all accessible portions of each vessel. Bilateral testing is considered an integral part of a complete examination. Limited examinations for reoccurring indications may be performed as noted. The reflux portion of the exam is performed with the patient in reverse Trendelenburg. Summary: BILATERAL: - No evidence of deep vein thrombosis seen in the  lower extremities, bilaterally.   *See table(s) above for measurements and observations. Electronically signed by Curt Jews MD on 06/06/2019 at 3:51:27 PM.    Final     12-lead ECG SR All prior EKG's in EPIC reviewed with no documented atrial fibrillation  Telemetry SR  Assessment and Plan:  1. Cryptogenic stroke The patient presents with cryptogenic stroke.  The patient has a TEE planned for this AM.  I spoke at length with the patient about monitoring for afib with either a 30 day event monitor or an implantable loop recorder.    Unfortunately the patient is medicaid pending and currently uninsured.  Discussed that out patient there is monthly billing/follow up and recommend waiting to put the loop in until he has insurance to help with compliance with monitoring and follow up  He is agreeable to the loop and agreeable with the plan to follow up outpatient once able to.  I will arrange outpatient follow up, and hopefully funding will be in place and we can proceed with loop  Please call with questions.   Baldwin Jamaica, PA-C 06/08/2019  R MCAstroke S/p thrombectomy cx by dissection flap>> reocclusion  Discussed the role of Afib in the possible differential and the implciations related to pharmacotherapy   He and his daughter voice understanding, but the decision has been made to defer recorder implantation 2/2 cost issues   Will see as outpt and proceed at that time

## 2019-06-08 NOTE — Anesthesia Procedure Notes (Signed)
Procedure Name: MAC Date/Time: 06/08/2019 9:19 AM Performed by: Janace Litten, CRNA Pre-anesthesia Checklist: Patient identified, Emergency Drugs available, Suction available and Patient being monitored Patient Re-evaluated:Patient Re-evaluated prior to induction Oxygen Delivery Method: Nasal cannula

## 2019-06-08 NOTE — Transfer of Care (Signed)
Immediate Anesthesia Transfer of Care Note  Patient: Scott Day  Procedure(s) Performed: TRANSESOPHAGEAL ECHOCARDIOGRAM (TEE) (N/A ) BUBBLE STUDY  Patient Location: Endoscopy Unit  Anesthesia Type:MAC  Level of Consciousness: drowsy and responds to stimulation  Airway & Oxygen Therapy: Patient Spontanous Breathing  Post-op Assessment: Report given to RN and Post -op Vital signs reviewed and stable  Post vital signs: Reviewed and stable  Last Vitals:  Vitals Value Taken Time  BP    Temp    Pulse    Resp    SpO2      Last Pain:  Vitals:   06/08/19 0902  TempSrc: Oral  PainSc: 0-No pain         Complications: No apparent anesthesia complications

## 2019-06-08 NOTE — Progress Notes (Signed)
Inpatient Rehab Admissions Coordinator:   I have a bed for this pt to admit to CIR on Saturday (3/20).  Desiree Metzger-Cihelka, NP with Stroke service, in agreement.  Rehab MD (Dr. Letta Pate) to assess pt and confirm admission on Saturday.  Floor RN can call CIR at [phone] for report after 12pm on Saturday.  I have let pt/family and case manager know.    Shann Medal, PT, DPT Admissions Coordinator [phone] 06/08/19  4:24 PM

## 2019-06-08 NOTE — PMR Pre-admission (Signed)
PMR Admission Coordinator Pre-Admission Assessment  Patient: Scott Day is an 59 y.o., male MRN: [id number] DOB: 10-Aug-1960 Height: 5\' 11"  (180.3 cm) Weight: 105 kg              Insurance Information HMO:     PPO:      PCP:      IPA:      80/20:     OTHER:  PRIMARY: Uninsured. Financial counselor is [employer]. She can be reached at [phone]. Pt has been screened for Medicaid as of 06/07/19.   Medicaid Application Date:       Case Manager:  Disability Application Date:       Case Worker:   The "Data Collection Information Summary" for patients in Inpatient Rehabilitation Facilities with attached "Privacy Act [place] Records" was provided and verbally reviewed with: N/A  Emergency Contact Information Contact Information    Name Relation Home Work Stickney Daughter   [phone]   HORACIO, PALLER  DL:[id number]       Current Medical History  Patient Admitting Diagnosis: CVA   History of Present Illness: Scott Day is a 59 year old right-handed male with history of colon cancer in 2012, GERD, tobacco abuse.  Presented 06/04/2018 while left-sided weakness and slurred speech.  Admission chemistries unremarkable except glucose 213, sodium 133, alcohol negative.  Cranial CT scan negative for acute changes.  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  Patient did not receive TPA.  Underwent revascularization stent placement per interventional radiology DR [place],.  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  Echocardiogram with ejection fraction of 70% without emboli.  Lower extremity Dopplers negative for DVT.  Currently maintained on aspirin as well as Brilinta for CVA prophylaxis.  Close monitoring of blood pressure initially on Cardene.  TEE showing ejection fraction of 65% without thrombus.  Will need to f/u with outpatient cardiology as no  loop recorder placed due to uninsured.  Also will need to ensure pt can afford Brilinta without insurance.  Findings of elevated hemoglobin A1c of 8.3 and placed on sliding scale insulin.  Dysphagia #3,  thin liquid diet.  Therapy evaluation completed and patient was recommended for a comprehensive rehab program.  Complete NIHSS TOTAL: 3 Glasgow Coma Scale Score: 15  Past Medical History  Past Medical History:  Diagnosis Date  . Cancer [facility]) 2012   colon cancer  . Closed fracture of left distal radius   . GERD (gastroesophageal reflux disease)     Family History  family history is not on file.  Prior Rehab/Hospitalizations:  Has the patient had prior rehab or hospitalizations prior to admission? No  Has the patient had major surgery during 100 days prior to admission? Yes  Current Medications   Current Facility-Administered Medications:  .   stroke: mapping our early stages of recovery book, , Does not apply, Once, Buford Dresser, MD .  acetaminophen (TYLENOL) tablet 650 mg, 650 mg, Oral, Q4H PRN, 650 mg at 06/07/19 1003 **OR** acetaminophen (TYLENOL) 160 MG/5ML solution 650 mg, 650 mg, Per Tube, Q4H PRN, 650 mg at 06/06/19 1055 **OR** acetaminophen (TYLENOL) suppository 650 mg, 650 mg, Rectal, Q4H PRN, Buford Dresser, MD .  aspirin chewable tablet 81 mg, 81 mg, Oral, Daily, Buford Dresser, MD, 81 mg at 06/08/19 1153 .  atorvastatin (LIPITOR) tablet 80 mg, 80 mg, Oral, q1800, Buford Dresser, MD, 80 mg at 06/07/19 1647 .  chlorhexidine (PERIDEX) 0.12 % solution 15 mL, 15 mL, Mouth Rinse, BID, Buford Dresser, MD, 15 mL at 06/08/19 0816 .  Chlorhexidine Gluconate Cloth 2 % PADS 6 each, 6 each, Topical, Daily, Buford Dresser, MD, 6 each at 06/08/19 0817 .  feeding supplement (ENSURE ENLIVE) (ENSURE ENLIVE) liquid 237 mL, 237 mL, Oral, BID BM, Buford Dresser, MD, 237 mL at 06/08/19 1155 .  insulin aspart (novoLOG) injection 0-15  Units, 0-15 Units, Subcutaneous, Q4H, Buford Dresser, MD, 2 Units at 06/08/19 1200 .  living well with diabetes book MISC, , Does not apply, Once, Buford Dresser, MD, Stopped at 06/06/19 1119 .  MEDLINE mouth rinse, 15 mL, Mouth Rinse, q12n4p, Buford Dresser, MD, 15 mL at 06/08/19 1155 .  phenol (CHLORASEPTIC) mouth spray 1 spray, 1 spray, Mouth/Throat, PRN, Buford Dresser, MD, 1 spray at 06/04/19 1716 .  ticagrelor (BRILINTA) tablet 90 mg, 90 mg, Oral, BID, Buford Dresser, MD, 90 mg at 06/08/19 1154  Patients Current Diet:  Diet Order            DIET DYS 3 Room service appropriate? Yes; Fluid consistency: Thin  Diet effective now              Precautions / Restrictions Precautions Precautions: Fall, Other (comment) Precaution Comments: Inattention of left Restrictions Weight Bearing Restrictions: No   Has the patient had 2 or more falls or a fall with injury in the past year?No  Prior Activity Level Community (5-7x/wk): pt was working, driving, no DME used for mobility or ADLs  Prior Functional Level Prior Function Level of Independence: Independent Comments: ADLs, IADLs, and works in [employer].  Self Care: Did the patient need help bathing, dressing, using the toilet or eating?  Independent  Indoor Mobility: Did the patient need assistance with walking from room to room (with or without device)? Independent  Stairs: Did the patient need assistance with internal or external stairs (with or without device)? Independent  Functional Cognition: Did the patient need help planning regular tasks such as shopping or remembering to take medications? Independent  Home Assistive Devices / Equipment Home Assistive Devices/Equipment: Dentures (specify type)(upper) Home Equipment: None  Prior Device Use: Indicate devices/aids used by the patient prior to current illness, exacerbation or injury? None of the above  Current Functional  Level Cognition  Arousal/Alertness: Lethargic Overall Cognitive Status: Impaired/Different from baseline Current Attention Level: Alternating Orientation Level: Oriented X4 Following Commands: Follows one step commands consistently, Follows multi-step commands inconsistently Safety/Judgement: Decreased awareness of safety, Decreased awareness of deficits General Comments: Pt continues to be impulsive and poor insight into deficits and inappropriate comments at times.  Required mod cues to attend to L side during gait. Attention: Sustained Sustained Attention: Impaired Sustained Attention Impairment: Verbal complex, Functional basic Memory: Impaired Memory Impairment: Storage deficit, Decreased recall of new information, Decreased short term memory Decreased Short Term Memory: Verbal complex, Functional basic Awareness: Impaired Awareness Impairment: Intellectual impairment, Emergent impairment, Anticipatory impairment Problem Solving: Impaired Problem Solving Impairment: Verbal complex, Functional basic Executive Function: Reasoning, Initiating, Self Correcting Reasoning: Impaired Reasoning Impairment: Verbal complex, Functional basic Initiating: Impaired Initiating Impairment: Verbal complex, Functional basic Self Correcting: Impaired Self Correcting Impairment: Verbal complex, Functional basic    Extremity Assessment (includes Sensation/Coordination)  Upper Extremity Assessment: LUE deficits/detail LUE Deficits / Details: Left sided inattention. ROM WFL. Improved coordination noted this session with pt able to reach for various sized objects on tray table with left hand. Noted ~2 drops throughout.  LUE Coordination: decreased fine motor, decreased  gross motor  Lower Extremity Assessment: Defer to PT evaluation LLE Deficits / Details: inattention to left hemi-body; no buckling in standing or gait    ADLs  Overall ADL's : Needs assistance/impaired Eating/Feeding: Supervision/  safety Eating/Feeding Details (indicate cue type and reason): Pt able to reach for drink and bring to mouth with RUE.  Grooming: Sitting, Minimal assistance, Min guard, Wash/dry face Grooming Details (indicate cue type and reason): Pt wiping his mouth with washclothe in LUE and noting decreased coorindation, maintaining grasp, and dropping to clothe as soon as finished with poor attention to LUE Upper Body Bathing: Minimal assistance, Sitting Lower Body Bathing: Moderate assistance, Sit to/from stand, Maximal assistance, +2 for physical assistance, +2 for safety/equipment Upper Body Dressing : Minimal assistance, Sitting Upper Body Dressing Details (indicate cue type and reason): Overshooting to right with reach RUE into sleeve. Unable to correct with VCs and requiring Min A.  Lower Body Dressing: Maximal assistance, +2 for physical assistance, +2 for safety/equipment, Sit to/from stand Lower Body Dressing Details (indicate cue type and reason): Pt able to sequence donning of socks and requring Max A for don while sitting at EOB.  Toilet Transfer: Moderate assistance, Maximal assistance, +2 for physical assistance, +2 for safety/equipment, Ambulation Functional mobility during ADLs: Moderate assistance, Maximal assistance, +2 for physical assistance, +2 for safety/equipment General ADL Comments: Worked on visual scanning task with familiar daily items while seated EOB. Noted improved visual scanning to the left at end of session.     Mobility  Overal bed mobility: Needs Assistance Bed Mobility: Supine to Sit, Sit to Supine Rolling: Supervision Supine to sit: Supervision Sit to supine: Supervision General bed mobility comments: cues for safety    Transfers  Overall transfer level: Needs assistance Equipment used: None Transfers: Sit to/from Stand Sit to Stand: Supervision General transfer comment: cues for safety    Ambulation / Gait / Stairs / Wheelchair Mobility   Ambulation/Gait Ambulation/Gait assistance: Counsellor (Feet): 150 Feet Assistive device: None Gait Pattern/deviations: Step-through pattern, Drifts right/left General Gait Details: Pt demonstrating impulsivity with gait requiring cues for safety.  Required cues to look left to avoid objects in tight areas.  Demonstrating poor safety with 3 LOB due to decreased L foot clearance (cued to correct) but recovered independenlty.  Pt was able to state room number and go in correct direction but was unable to find his room on left- passed room and still unable to find on right when he turned around. Gait velocity: slowed Gait velocity interpretation: 1.31 - 2.62 ft/sec, indicative of limited community ambulator Stairs: Yes Stairs assistance: Min guard Stair Management: Two rails, Step to pattern Number of Stairs: 4 General stair comments: cues for safety    Posture / Balance Dynamic Sitting Balance Sitting balance - Comments: supervision due to impulsivity Balance Overall balance assessment: Needs assistance Sitting-balance support: No upper extremity supported, Feet supported Sitting balance-Leahy Scale: Good Sitting balance - Comments: supervision due to impulsivity Standing balance support: No upper extremity supported, During functional activity Standing balance-Leahy Scale: Poor Standing balance comment: minA Standardized Balance Assessment Standardized Balance Assessment : Berg Balance Test Berg Balance Test Sit to Stand: Able to stand  independently using hands Standing Unsupported: Able to stand 2 minutes with supervision Sitting with Back Unsupported but Feet Supported on Floor or Stool: Able to sit safely and securely 2 minutes Stand to Sit: Sits safely with minimal use of hands Transfers: Able to transfer safely, minor use of hands Standing Unsupported with Eyes  Closed: Able to stand 10 seconds with supervision Standing Ubsupported with Feet Together: Able to place  feet together independently but unable to hold for 30 seconds From Standing, Reach Forward with Outstretched Arm: Reaches forward but needs supervision From Standing Position, Pick up Object from Floor: Unable to pick up shoe, but reaches 2-5 cm (1-2") from shoe and balances independently From Standing Position, Turn to Look Behind Over each Shoulder: Needs supervision when turning Turn 360 Degrees: Needs close supervision or verbal cueing Standing Unsupported, Alternately Place Feet on Step/Stool: Needs assistance to keep from falling or unable to try Standing Unsupported, One Foot in Front: Loses balance while stepping or standing Standing on One Leg: Able to lift leg independently and hold equal to or more than 3 seconds Total Score: 30    Special needs/care consideration BiPAP/CPAP no CPM no Continuous Drip IV no Dialysis no        Days n/a Life Vest no Oxygen no Special Bed no Trach Size no Wound Vac (area) no      Location n/a Skin incision to R groin                  Bowel mgmt: continent, though urgent Bladder mgmt: continent, though urgent Diabetic mgmt yes, new diabetic Behavioral consideration no Chemo/radiation no     Previous Home Environment (from acute therapy documentation) Living Arrangements: Alone(Simultaneous filing. User may not have seen previous data.) Available Help at Discharge: Family, Available 24 hours/day Type of Home: House Home Layout: One level Home Access: Stairs to enter [employer] of Steps: 2 Bathroom Shower/Tub: Chiropodist: [place]: No  Discharge Living Setting Plans for Discharge Living Setting: Patient's home Type of Home at Discharge: House Discharge Home Layout: One level Discharge Home Access: Stairs to enter Entrance Stairs-Rails: None(can install rails if needed) Entrance Stairs-Number of Steps: 2 Discharge Bathroom Shower/Tub: Tub/shower unit Discharge Bathroom Toilet:  Standard Discharge Bathroom Accessibility: Yes How Accessible: Accessible via walker Does the patient have any problems obtaining your medications?: Yes (Describe)(uninsured)  Social/Family/Support Systems Anticipated Caregiver: daughter [name]), son Barbaraann Share)  Anticipated Caregiver's Contact Information: [name] [phone], Barbaraann Share [phone] Ability/Limitations of Caregiver: both work, but have been rotating to give 24/7 assist in hospital, aware that pt will need 24/7 at home as well Caregiver Availability: 24/7 Discharge Plan Discussed with Primary Caregiver: Yes Is Caregiver In Agreement with Plan?: Yes Does Caregiver/Family have Issues with Lodging/Transportation while Pt is in Rehab?: No   Goals/Additional Needs Patient/Family Goal for Rehab: PT/OT/SLP supervision to mod I Expected length of stay: 9-12 days Dietary Needs: dys 3 /thin Additional Information: Pt uninsured, recommended Brilinta for anticoagulation, which is expensive, will need to discuss with Cards if needed to change prior to d/c; also did not get loop recorder 2/2 uninsured, will need outpatient cardiology follow up for monitor Pt/Family Agrees to Admission and willing to participate: Yes Program Orientation Provided & Reviewed with Pt/Caregiver Including Roles  & Responsibilities: Yes  Barriers to Discharge: Other (comments)(cost of medications and follow up)   Decrease burden of Care through IP rehab admission: n/a   Possible need for SNF placement upon discharge: Not anticipated. Family aware of need for 24/7 at discharge.    Patient Condition: This patient's medical and functional status has changed since the consult dated: 06/06/2019 in which the Rehabilitation Physician determined and documented that the patient's condition is appropriate for intensive rehabilitative care in an inpatient rehabilitation facility. See "History of Present Illness" (above) for  medical update. Functional changes are: min  assist with mobility, BERG score 30/56. Patient's medical and functional status update has been discussed with the Rehabilitation physician and patient remains appropriate for inpatient rehabilitation. Will admit to inpatient rehab today.  Preadmission Screen Completed By:  Michel Santee, PT, DPT 06/08/2019 4:26 PM ______________________________________________________________________   Discussed status with Dr. Letta Pate on 06/08/19 at 4:34 PM  and received approval for admission today.  Admission Coordinator:  Michel Santee, PT, DPT  Time 4:34 Heidi Dach Sudie Grumbling 06/08/19

## 2019-06-09 ENCOUNTER — Inpatient Hospital Stay (HOSPITAL_COMMUNITY)
Admission: RE | Admit: 2019-06-09 | Discharge: 2019-06-16 | DRG: 057 | Disposition: A | Discharge status: Home Health | Payer: Medicaid Other | Source: Intra-hospital | Attending: Physical Medicine & Rehabilitation | Admitting: Physical Medicine & Rehabilitation

## 2019-06-09 ENCOUNTER — Inpatient Hospital Stay (HOSPITAL_COMMUNITY): Payer: Medicaid Other

## 2019-06-09 ENCOUNTER — Other Ambulatory Visit: Payer: Self-pay

## 2019-06-09 ENCOUNTER — Encounter (HOSPITAL_COMMUNITY): Payer: Self-pay | Admitting: Internal Medicine

## 2019-06-09 DIAGNOSIS — Z720 Tobacco use: Secondary | ICD-10-CM

## 2019-06-09 DIAGNOSIS — E119 Type 2 diabetes mellitus without complications: Secondary | ICD-10-CM

## 2019-06-09 DIAGNOSIS — I69322 Dysarthria following cerebral infarction: Secondary | ICD-10-CM | POA: Diagnosis not present

## 2019-06-09 DIAGNOSIS — I69391 Dysphagia following cerebral infarction: Secondary | ICD-10-CM | POA: Diagnosis not present

## 2019-06-09 DIAGNOSIS — Z85038 Personal history of other malignant neoplasm of large intestine: Secondary | ICD-10-CM

## 2019-06-09 DIAGNOSIS — I69328 Other speech and language deficits following cerebral infarction: Secondary | ICD-10-CM

## 2019-06-09 DIAGNOSIS — N390 Urinary tract infection, site not specified: Secondary | ICD-10-CM | POA: Diagnosis present

## 2019-06-09 DIAGNOSIS — I69354 Hemiplegia and hemiparesis following cerebral infarction affecting left non-dominant side: Principal | ICD-10-CM

## 2019-06-09 DIAGNOSIS — R1312 Dysphagia, oropharyngeal phase: Secondary | ICD-10-CM | POA: Diagnosis present

## 2019-06-09 DIAGNOSIS — R7309 Other abnormal glucose: Secondary | ICD-10-CM

## 2019-06-09 DIAGNOSIS — K219 Gastro-esophageal reflux disease without esophagitis: Secondary | ICD-10-CM | POA: Diagnosis present

## 2019-06-09 DIAGNOSIS — Z87891 Personal history of nicotine dependence: Secondary | ICD-10-CM

## 2019-06-09 DIAGNOSIS — E785 Hyperlipidemia, unspecified: Secondary | ICD-10-CM | POA: Diagnosis present

## 2019-06-09 DIAGNOSIS — N3000 Acute cystitis without hematuria: Secondary | ICD-10-CM

## 2019-06-09 DIAGNOSIS — B9620 Unspecified Escherichia coli [E. coli] as the cause of diseases classified elsewhere: Secondary | ICD-10-CM | POA: Diagnosis present

## 2019-06-09 DIAGNOSIS — E6609 Other obesity due to excess calories: Secondary | ICD-10-CM

## 2019-06-09 DIAGNOSIS — E876 Hypokalemia: Secondary | ICD-10-CM

## 2019-06-09 DIAGNOSIS — I63511 Cerebral infarction due to unspecified occlusion or stenosis of right middle cerebral artery: Secondary | ICD-10-CM | POA: Diagnosis not present

## 2019-06-09 DIAGNOSIS — Z6832 Body mass index (BMI) 32.0-32.9, adult: Secondary | ICD-10-CM

## 2019-06-09 DIAGNOSIS — I10 Essential (primary) hypertension: Secondary | ICD-10-CM | POA: Diagnosis present

## 2019-06-09 DIAGNOSIS — D72829 Elevated white blood cell count, unspecified: Secondary | ICD-10-CM

## 2019-06-09 LAB — URINALYSIS, COMPLETE (UACMP) WITH MICROSCOPIC
Bilirubin Urine: NEGATIVE
Glucose, UA: NEGATIVE mg/dL
Ketones, ur: NEGATIVE mg/dL
Nitrite: POSITIVE — AB
Protein, ur: 30 mg/dL — AB
Specific Gravity, Urine: 1.021 (ref 1.005–1.030)
WBC, UA: >50 WBC/hpf — ABNORMAL HIGH (ref 0–5)
pH: 5 (ref 5.0–8.0)

## 2019-06-09 LAB — LUPUS ANTICOAGULANT PANEL
DRVVT: 29.1 s (ref 0.0–47.0)
PTT Lupus Anticoagulant: 34 s (ref 0.0–51.9)

## 2019-06-09 LAB — GLUCOSE, CAPILLARY
Glucose-Capillary: 167 mg/dL — ABNORMAL HIGH (ref 70–99)
Glucose-Capillary: 119 mg/dL — ABNORMAL HIGH (ref 70–99)
Glucose-Capillary: 132 mg/dL — ABNORMAL HIGH (ref 70–99)
Glucose-Capillary: 127 mg/dL — ABNORMAL HIGH (ref 70–99)

## 2019-06-09 LAB — CBC
HCT: 43.1 % (ref 39.0–52.0)
Hemoglobin: 15 g/dL (ref 13.0–17.0)
MCH: 32 pg (ref 26.0–34.0)
MCHC: 34.8 g/dL (ref 30.0–36.0)
MCV: 91.9 fL (ref 80.0–100.0)
Platelets: 212 10*3/uL (ref 150–400)
RBC: 4.69 MIL/uL (ref 4.22–5.81)
RDW: 12.3 % (ref 11.5–15.5)
WBC: 13.4 10*3/uL — ABNORMAL HIGH (ref 4.0–10.5)
nRBC: 0 % (ref 0.0–0.2)

## 2019-06-09 LAB — BASIC METABOLIC PANEL
Anion gap: 13 (ref 5–15)
BUN: 8 mg/dL (ref 6–20)
CO2: 19 mmol/L — ABNORMAL LOW (ref 22–32)
Calcium: 9 mg/dL (ref 8.9–10.3)
Chloride: 105 mmol/L (ref 98–111)
Creatinine, Ser: 0.85 mg/dL (ref 0.61–1.24)
GFR calc Af Amer: >60 mL/min (ref >60)
GFR calc non Af Amer: >60 mL/min (ref >60)
Glucose, Bld: 128 mg/dL — ABNORMAL HIGH (ref 70–99)
Potassium: 3.3 mmol/L — ABNORMAL LOW (ref 3.5–5.1)
Sodium: 137 mmol/L (ref 135–145)

## 2019-06-09 MED ORDER — CHLORHEXIDINE GLUCONATE 0.12 % MT SOLN: 15.0000 mL | Freq: Two times a day (BID) | OROMUCOSAL | 0 refills | Status: DC

## 2019-06-09 MED ORDER — SULFAMETHOXAZOLE-TRIMETHOPRIM 800-160 MG PO TABS
1.0000 | ORAL_TABLET | Freq: Two times a day (BID) | ORAL | Status: DC
  Administered 2019-06-09: 1 via ORAL
  Filled 2019-06-09: qty 1

## 2019-06-09 MED ORDER — ATORVASTATIN CALCIUM 80 MG PO TABS
80.0000 mg | ORAL_TABLET | Freq: Every day | ORAL | Status: DC
  Administered 2019-06-09 – 2019-06-15 (×7): 80 mg via ORAL
  Filled 2019-06-09 (×7): qty 1

## 2019-06-09 MED ORDER — ACETAMINOPHEN 650 MG RE SUPP: 650.0000 mg | RECTAL | Status: DC | PRN

## 2019-06-09 MED ORDER — SULFAMETHOXAZOLE-TRIMETHOPRIM 800-160 MG PO TABS: 1.0000 | ORAL_TABLET | Freq: Two times a day (BID) | ORAL | Status: DC

## 2019-06-09 MED ORDER — TICAGRELOR 90 MG PO TABS: 90.0000 mg | ORAL_TABLET | Freq: Two times a day (BID) | ORAL | Status: DC

## 2019-06-09 MED ORDER — SULFAMETHOXAZOLE-TRIMETHOPRIM 800-160 MG PO TABS
1.0000 | ORAL_TABLET | Freq: Two times a day (BID) | ORAL | Status: AC
  Administered 2019-06-09 – 2019-06-12 (×6): 1 via ORAL
  Filled 2019-06-09 (×7): qty 1

## 2019-06-09 MED ORDER — BUTALBITAL-APAP-CAFFEINE 50-325-40 MG PO TABS: 1.0000 | ORAL_TABLET | Freq: Two times a day (BID) | ORAL | 0 refills | Status: DC | PRN

## 2019-06-09 MED ORDER — ATORVASTATIN CALCIUM 80 MG PO TABS: 80.0000 mg | ORAL_TABLET | Freq: Every day | ORAL | Status: DC

## 2019-06-09 MED ORDER — POTASSIUM CHLORIDE CRYS ER 20 MEQ PO TBCR
20.0000 meq | EXTENDED_RELEASE_TABLET | Freq: Two times a day (BID) | ORAL | Status: DC
  Administered 2019-06-09: 20 meq via ORAL
  Filled 2019-06-09: qty 1

## 2019-06-09 MED ORDER — ACETAMINOPHEN 325 MG PO TABS
650.0000 mg | ORAL_TABLET | ORAL | Status: DC | PRN
  Administered 2019-06-09 – 2019-06-15 (×13): 650 mg via ORAL
  Filled 2019-06-09 (×14): qty 2

## 2019-06-09 MED ORDER — TICAGRELOR 90 MG PO TABS
90.0000 mg | ORAL_TABLET | Freq: Two times a day (BID) | ORAL | Status: DC
  Administered 2019-06-09 – 2019-06-16 (×14): 90 mg via ORAL
  Filled 2019-06-09 (×14): qty 1

## 2019-06-09 MED ORDER — INSULIN ASPART 100 UNIT/ML ~~LOC~~ SOLN
0.0000 [IU] | SUBCUTANEOUS | Status: DC
  Administered 2019-06-10 – 2019-06-11 (×5): 2 [IU] via SUBCUTANEOUS
  Administered 2019-06-12: 3 [IU] via SUBCUTANEOUS

## 2019-06-09 MED ORDER — INSULIN ASPART 100 UNIT/ML ~~LOC~~ SOLN: 0.0000 [IU] | SUBCUTANEOUS | 11 refills | Status: DC

## 2019-06-09 MED ORDER — ASPIRIN 81 MG PO CHEW: 81.0000 mg | CHEWABLE_TABLET | Freq: Every day | ORAL | Status: AC

## 2019-06-09 MED ORDER — ENSURE ENLIVE PO LIQD: 237.0000 mL | Freq: Two times a day (BID) | ORAL | 12 refills | Status: DC

## 2019-06-09 MED ORDER — BUTALBITAL-APAP-CAFFEINE 50-325-40 MG PO TABS
1.0000 | ORAL_TABLET | Freq: Two times a day (BID) | ORAL | Status: DC | PRN
  Filled 2019-06-09: qty 1

## 2019-06-09 MED ORDER — ACETAMINOPHEN 160 MG/5ML PO SOLN: 650.0000 mg | ORAL | Status: DC | PRN

## 2019-06-09 MED ORDER — SORBITOL 70 % SOLN: 30.0000 mL | Freq: Every day | Status: DC | PRN

## 2019-06-09 MED ORDER — POTASSIUM CHLORIDE CRYS ER 20 MEQ PO TBCR: 20.0000 meq | EXTENDED_RELEASE_TABLET | Freq: Two times a day (BID) | ORAL | Status: DC

## 2019-06-09 MED ORDER — SODIUM CHLORIDE 0.9 % IV SOLN
1.0000 g | Freq: Once | INTRAVENOUS | Status: DC
  Filled 2019-06-09: qty 10

## 2019-06-09 MED ORDER — ASPIRIN 81 MG PO CHEW
81.0000 mg | CHEWABLE_TABLET | Freq: Every day | ORAL | Status: DC
  Administered 2019-06-10 – 2019-06-16 (×7): 81 mg via ORAL
  Filled 2019-06-09 (×7): qty 1

## 2019-06-09 NOTE — Progress Notes (Signed)
Patient arrived to unit, no s/s of distress noted. Son and daughter at bedside. Patient requested for health care power of attorney be changed to his daughter heather Sindoni, as she states she will discuss with social worker on Monday. No complications noted at this time.  Audie Clear, LPN

## 2019-06-09 NOTE — Discharge Summary (Addendum)
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 Cancer [facility]) 2012   colon cancer  . Closed fracture of left distal radius   . GERD (gastroesophageal reflux disease)    Past Surgical History:  Procedure Laterality Date  . COLON SURGERY  2012   colon cancer  . IR CT HEAD LTD  06/04/2019  . IR CT HEAD LTD  06/04/2019  . IR INTRA CRAN STENT  06/04/2019  . IR PERCUTANEOUS ART THROMBECTOMY/INFUSION INTRACRANIAL INC DIAG ANGIO  06/04/2019  . IR US GUIDE VASC ACCESS RIGHT  06/04/2019  . OPEN REDUCTION INTERNAL FIXATION (ORIF) DISTAL RADIAL FRACTURE Left 09/20/2017   Procedure: OPEN REDUCTION INTERNAL FIXATION (ORIF) DISTAL RADIAL FRACTURE;  Surgeon: Leanora Cover, MD;  Location: [place];  Service: Orthopedics;  Laterality: Left;  . RADIOLOGY WITH ANESTHESIA N/A 06/04/2019   Procedure: IR WITH ANESTHESIA;  Surgeon: Radiologist, Medication, MD;  Location: [place];  Service: Radiology;  Laterality: N/A;    HOSPITAL MEDICATIONS .  stroke: mapping our early stages of recovery book   Does not apply Once  . aspirin  81 mg Oral Daily  . atorvastatin  80 mg Oral q1800  . chlorhexidine  15 mL Mouth Rinse BID  . Chlorhexidine Gluconate  Cloth  6 each Topical Daily  . feeding supplement (ENSURE ENLIVE)  237 mL Oral BID BM  . insulin aspart  0-15 Units Subcutaneous Q4H  . living well with diabetes book   Does not apply Once  . mouth rinse  15 mL Mouth Rinse q12n4p  . potassium chloride  20 mEq Oral BID  . sulfamethoxazole-trimethoprim  1 tablet Oral Q12H  . ticagrelor  90 mg Oral BID    HOME MEDICATIONS PRIOR TO ADMISSION Medications Prior to Admission  Medication Sig Dispense Refill  . HYDROcodone-acetaminophen (NORCO) 5-325 MG tablet 1-2 tabs po q6 hours prn pain (Patient not taking: Reported on 06/04/2019) 30 tablet 0    LABORATORY STUDIES CBC    Component Value Date/Time   WBC 13.4 (H) 06/09/2019 0432   RBC 4.69 06/09/2019 0432   HGB 15.0 06/09/2019 0432   HGB 16.3 12/26/2006 1008   HCT 43.1 06/09/2019 0432   HCT 45.1 12/26/2006 1008   PLT 212 06/09/2019 0432   PLT 184 12/26/2006 1008   MCV 91.9 06/09/2019 0432   MCV 94.2 12/26/2006 1008   MCH 32.0 06/09/2019 0432   MCHC 34.8 06/09/2019 0432   RDW 12.3 06/09/2019 0432   RDW 12.7 12/26/2006 1008   LYMPHSABS 1.2 06/04/2019 1125   LYMPHSABS 2.1 12/26/2006 1008   MONOABS 0.4 06/04/2019 1125   MONOABS 0.5 12/26/2006 1008   EOSABS 0.1 06/04/2019 1125   EOSABS 0.2  12/26/2006 1008   BASOSABS 0.0 06/04/2019 1125   BASOSABS 0.1 12/26/2006 1008   CMP    Component Value Date/Time   NA 137 06/09/2019 0432   K 3.3 (L) 06/09/2019 0432   CL 105 06/09/2019 0432   CO2 19 (L) 06/09/2019 0432   GLUCOSE 128 (H) 06/09/2019 0432   BUN 8 06/09/2019 0432   CREATININE 0.85 06/09/2019 0432   CALCIUM 9.0 06/09/2019 0432   PROT 6.9 06/04/2019 1125   ALBUMIN 3.9 06/04/2019 1125   AST 19 06/04/2019 1125   ALT 24 06/04/2019 1125   ALKPHOS 74 06/04/2019 1125   BILITOT 0.9 06/04/2019 1125   GFRNONAA >60 06/09/2019 0432   GFRAA >60 06/09/2019 0432   COAGS Lab Results  Component Value Date   INR 1.1 06/04/2019   Lipid Panel    Component Value Date/Time   CHOL 180  06/05/2019 0543   TRIG 131 06/05/2019 0543   HDL 28 (L) 06/05/2019 0543   CHOLHDL 6.4 06/05/2019 0543   VLDL 26 06/05/2019 0543   LDLCALC 126 (H) 06/05/2019 0543   HgbA1C  Lab Results  Component Value Date   HGBA1C 8.3 (H) 06/05/2019   Urinalysis    Component Value Date/Time   COLORURINE YELLOW 06/09/2019 1045   APPEARANCEUR HAZY (A) 06/09/2019 1045   LABSPEC 1.021 06/09/2019 1045   PHURINE 5.0 06/09/2019 1045   GLUCOSEU NEGATIVE 06/09/2019 1045   HGBUR MODERATE (A) 06/09/2019 1045   BILIRUBINUR NEGATIVE 06/09/2019 1045   KETONESUR NEGATIVE 06/09/2019 1045   PROTEINUR 30 (A) 06/09/2019 1045   NITRITE POSITIVE (A) 06/09/2019 1045   LEUKOCYTESUR MODERATE (A) 06/09/2019 1045   Urine Drug Screen     Component Value Date/Time   LABOPIA NONE DETECTED 06/05/2019 1926   COCAINSCRNUR NONE DETECTED 06/05/2019 1926   LABBENZ NONE DETECTED 06/05/2019 1926   AMPHETMU NONE DETECTED 06/05/2019 1926   THCU NONE DETECTED 06/05/2019 1926   LABBARB NONE DETECTED 06/05/2019 1926    Alcohol Level    Component Value Date/Time   ETH <10 06/04/2019 1157     SIGNIFICANT DIAGNOSTIC STUDIES  CT ANGIO HEAD W OR WO CONTRAST  Addendum Date: 06/04/2019   ADDENDUM REPORT: 06/04/2019 12:21 ADDENDUM: Contrast dose is 100 mL Omnipaque 350 Electronically Signed   By: Macy Mis M.D.   On: 06/04/2019 12:21   Result Date: 06/04/2019 CLINICAL DATA:  Code stroke.  Slurred speech and left facial droop EXAM: CT ANGIOGRAPHY HEAD AND NECK CT PERFUSION BRAIN TECHNIQUE: Multidetector CT imaging of the head and neck was performed using the standard protocol during bolus administration of intravenous contrast. Multiplanar CT image reconstructions and MIPs were obtained to evaluate the vascular anatomy. Carotid stenosis measurements (when applicable) are obtained utilizing NASCET criteria, using the distal internal carotid diameter as the denominator. Multiphase CT imaging of the brain was performed following  IV bolus contrast injection. Subsequent parametric perfusion maps were calculated using RAPID software. COMPARISON:  None. FINDINGS: CT HEAD Brain: There is no acute intracranial hemorrhage mass effect edema. Gray-white differentiation is preserved. Ventricles and sulci normal in size and configuration. There is no extra-axial fluid collection. Vascular: Hyperdense vessel. Skull: Unremarkable. Sinuses/Orbits: Lobular mucosal thickening with greatest involvement of the right maxillary sinus. No acute orbital finding. Other: Mastoid air cells are clear ASPECTS [facility] Stroke Program Early CT Score) - Ganglionic level infarction (caudate, lentiform nuclei, internal capsule, insula, M1-M3 cortex): 7 - Supraganglionic infarction (M4-M6 cortex): 3 Total score (0-10 with 10 being normal): 10 Review of the MIP  images confirms the above findings CTA NECK Aortic arch: Great vessel origins are patent. Right carotid system: Patent. There is trace calcified plaque at the ICA origin measurable stenosis. Left carotid system: Patent. Eccentric noncalcified plaque causing less than 50% stenosis. Mild calcified plaque at the ICA origin causing less than 50% stenosis. Vertebral arteries: Patent.  Left vertebral artery dominant. Skeleton: Cervical spine degenerative changes. Other neck: No mass or adenopathy Upper chest: No apical lung mass. Review of the MIP images confirms the above findings CTA HEAD Anterior circulation: Intracranial internal carotid arteries patent. There is nonocclusive thrombus within distal right M1 MCA extending to the bifurcation. Left middle cerebral artery is patent. Anterior cerebral arteries are patent. Congenitally absent right A1 ACA. Posterior circulation: Intracranial vertebral arteries, basilar artery, and posterior cerebral arteries are patent. There are bilateral patent posterior communicating arteries. Venous sinuses: As permitted by contrast timing, patent. Review of the MIP images confirms the  above findings CT Brain Perfusion: CBF (<30%) Volume: 63m Perfusion (Tmax>6.0s) volume: 1543mMismatch Volume: 12694mnfarction Location: Right MCA territory IMPRESSION: No acute intracranial hemorrhage or evidence of acute infarction. ASPECT score is 10. 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 Perfusion imaging demonstrates 25 mL core infarction and 126 mL territory at risk in the right MCA territory. No hemodynamically significant stenosis in the neck. These results were called by telephone at the time of interpretation on 06/04/2019 at 11:53 am to provider JONMontevista HospitalLESAlexis Goodellwho verbally acknowledged these results. Electronically Signed: By: PraMacy MisD. On: 06/04/2019 11:59   DG Abd 1 View  Result Date: 06/05/2019 CLINICAL DATA:  New NG tube EXAM: ABDOMEN - 1 VIEW COMPARISON:  None. FINDINGS: Advancement of NG tube such that the tip is at the expected location of the junction of the second third portion the duodenum. IMPRESSION: NG tube extends into duodenum Electronically Signed   By: SteSuzy BouchardD.   On: 06/05/2019 14:28   DG Abd 1 View  Result Date: 06/04/2019 CLINICAL DATA:  Nasogastric tube placement. EXAM: ABDOMEN - 1 VIEW COMPARISON:  None. FINDINGS: A nasogastric tube is seen with its distal tip overlying the body of the stomach. This is approximately 7.8 cm distal to the expected region of the gastroesophageal junction. The bowel gas pattern is normal. No radio-opaque calculi or other significant radiographic abnormality are seen. IMPRESSION: Nasogastric tube positioning, as described above. Further advancement of the NG tube by approximately 5 cm is recommended to decrease the risk of aspiration. Electronically Signed   By: ThaVirgina NorfolkD.   On: 06/04/2019 20:23   CT HEAD WO CONTRAST  Result Date: 06/05/2019 CLINICAL DATA:  Follow-up stroke.  Right MCA territory infarctions. EXAM: CT HEAD WITHOUT CONTRAST  TECHNIQUE: Contiguous axial images were obtained from the base of the skull through the vertex without intravenous contrast. COMPARISON:  Multiple CT studies over the last 2 days. FINDINGS: Brain: Patchy low-density in the right middle cerebral artery territory continues to become more evident by CT consistent with multiple infarctions in that region. These are notable in the insula, right lateral and posterior temporal lobe, right frontal operculum and right frontal parietal junction. No significant swelling, mass effect or any detectable hemorrhage. Right MCA stent as seen previously. Vascular: There is atherosclerotic calcification of the major vessels at the base of the brain. Right MCA stent as seen previously. Skull: Negative Sinuses/Orbits: Chronic sinusitis particularly affecting the right maxillary sinus. Orbits negative. Other: None IMPRESSION:  Multiple infarctions in the right middle cerebral artery territory becoming more evident on CT, with increasing low density. No evidence of significant mass effect or any hemorrhage. Electronically Signed   By: Nelson Chimes M.D.   On: 06/05/2019 15:38   CT HEAD WO CONTRAST  Result Date: 06/04/2019 CLINICAL DATA:  Stroke, follow-up. EXAM: CT HEAD WITHOUT CONTRAST TECHNIQUE: Contiguous axial images were obtained from the base of the skull through the vertex without intravenous contrast. COMPARISON:  Noncontrast head CT and CT angiogram head/neck performed earlier the same day 06/04/2019 FINDINGS: Brain: There has been interval development of patchy acute cortical/subcortical infarction changes within the right MCA vascular territory. Findings are most notable within the right frontal operculum, right insula and within portions of the right temporoparietal junction (series 3, image 20) (series 3, image 18) (series 5, image 48). No evidence of hemorrhagic conversion. No significant mass effect. No midline shift. There is no hydrocephalus. No extra-axial fluid  collection is identified. Vascular: A stent is now present in the region of the M1 right middle cerebral artery. Otherwise, no hyperdense vessel is identified. Skull: Normal. Negative for fracture or focal lesion. Sinuses/Orbits: Visualized orbits demonstrate no acute abnormality. Moderate mucosal thickening and frothy secretions within the right maxillary sinus. Additional mild scattered paranasal sinus mucosal thickening. Left frontal sinus mucous retention cyst. No significant mastoid effusion. IMPRESSION: Interval development of patchy acute cortical/subcortical infarction changes within the right MCA vascular territory most notable within the right frontal operculum, right insula and portions of the right temporoparietal junction. No hemorrhagic conversion. No midline shift or significant mass effect. Paranasal sinus disease as described, most notably right maxillary. Electronically Signed   By: Kellie Simmering DO   On: 06/04/2019 18:43   CT ANGIO NECK W OR WO CONTRAST  Addendum Date: 06/04/2019   ADDENDUM REPORT: 06/04/2019 12:21 ADDENDUM: Contrast dose is 100 mL Omnipaque 350 Electronically Signed   By: Macy Mis M.D.   On: 06/04/2019 12:21   Result Date: 06/04/2019 CLINICAL DATA:  Code stroke.  Slurred speech and left facial droop EXAM: CT ANGIOGRAPHY HEAD AND NECK CT PERFUSION BRAIN TECHNIQUE: Multidetector CT imaging of the head and neck was performed using the standard protocol during bolus administration of intravenous contrast. Multiplanar CT image reconstructions and MIPs were obtained to evaluate the vascular anatomy. Carotid stenosis measurements (when applicable) are obtained utilizing NASCET criteria, using the distal internal carotid diameter as the denominator. Multiphase CT imaging of the brain was performed following IV bolus contrast injection. Subsequent parametric perfusion maps were calculated using RAPID software. COMPARISON:  None. FINDINGS: CT HEAD Brain: There is no acute  intracranial hemorrhage mass effect edema. Gray-white differentiation is preserved. Ventricles and sulci normal in size and configuration. There is no extra-axial fluid collection. Vascular: Hyperdense vessel. Skull: Unremarkable. Sinuses/Orbits: Lobular mucosal thickening with greatest involvement of the right maxillary sinus. No acute orbital finding. Other: Mastoid air cells are clear ASPECTS [facility] Stroke Program Early CT Score) - Ganglionic level infarction (caudate, lentiform nuclei, internal capsule, insula, M1-M3 cortex): 7 - Supraganglionic infarction (M4-M6 cortex): 3 Total score (0-10 with 10 being normal): 10 Review of the MIP images confirms the above findings CTA NECK Aortic arch: Great vessel origins are patent. Right carotid system: Patent. There is trace calcified plaque at the ICA origin measurable stenosis. Left carotid system: Patent. Eccentric noncalcified plaque causing less than 50% stenosis. Mild calcified plaque at the ICA origin causing less than 50% stenosis. Vertebral arteries: Patent.  Left vertebral artery dominant. Skeleton:  Cervical spine degenerative changes. Other neck: No mass or adenopathy Upper chest: No apical lung mass. Review of the MIP images confirms the above findings CTA HEAD Anterior circulation: Intracranial internal carotid arteries patent. There is nonocclusive thrombus within distal right M1 MCA extending to the bifurcation. Left middle cerebral artery is patent. Anterior cerebral arteries are patent. Congenitally absent right A1 ACA. Posterior circulation: Intracranial vertebral arteries, basilar artery, and posterior cerebral arteries are patent. There are bilateral patent posterior communicating arteries. Venous sinuses: As permitted by contrast timing, patent. Review of the MIP images confirms the above findings CT Brain Perfusion: CBF (<30%) Volume: 45m Perfusion (Tmax>6.0s) volume: 1575mMismatch Volume: 12650mnfarction Location: Right MCA territory  IMPRESSION: No acute intracranial hemorrhage or evidence of acute infarction. ASPECT score is 10. 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 Perfusion imaging demonstrates 25 mL core infarction and 126 mL territory at risk in the right MCA territory. No hemodynamically significant stenosis in the neck. These results were called by telephone at the time of interpretation on 06/04/2019 at 11:53 am to provider JONNorth Shore Medical CenterLESAlexis Goodellwho verbally acknowledged these results. Electronically Signed: By: PraMacy MisD. On: 06/04/2019 11:59   IR Intra Cran Stent  Result Date: 06/04/2019 CLINICAL DATA:  [age]ar old male with past medical history significant for colon cancer in 2012. He developed left-sided weakness and neglect on awakening this morning. [facility] showed small hypodensity in the right insula with no hemorrhage. NIHSS 10. CT angiogram showed tapering of the right M1/MCA with short-segment filling defect. CT perfusion showed an estimated core volume of 25 mL with a mismatch of 126 mL. He was transferred to our service for emergency treatment of the right M1/MCA occlusion. EXAM: IR CT HEAD LIMITED; IR PERCUTANEOUS ART THORMBECTOMY/INFUSION INTRACRANIAL INCLUDE DIAG ANGIO; IR ULTRASOUND GUIDANCE VASC ACCESS RIGHT; INTRACRANIAL STENT (INCL PTA) ANESTHESIA/SEDATION: General anesthesia. MEDICATIONS: Intra arterial Verapamil 5 mg; intra arterial Integrilin 19 mg. CONTRAST:  5mL110mNIPAQUE IOHEXOL 300 MG/ML SOLN, 75mL53mIPAQUE IOHEXOL 240 MG/ML SOLN PROCEDURE: Operator: Dr. KatyuPedro Earls No healthcare proxy or next of kin available for informed consent. Discussion held with the neurohospitalist attending. Agreement reached that the procedure would be in the best interest of the patient and benefits outweighed risks. The patient was made to lie supine on the angiography table. He was prepped and  draped utilizing the usual sterile technique. Using gray scale ultrasound-guided, a micropuncture kit and modified Seldinger technique, access was gained to the right common femoral artery. An 8 French femoral sheath was then placed. Under fluoroscopy, an 8 Frenc[place]us balloon guide catheter was navigated over a 6 FrencPakistannstein 2 catheter and a 0.038 inch Terumo Glidewire into the aortic arch. Under fluoroscopy, the catheter was advanced into the right common carotid artery and then into the right internal carotid artery. Frontal and lateral angiograms of the head were obtained showing a distal right M1/MCA occlusion with minimal delayed penetration of contrast. A large bore aspiration catheter was then navigated through the walrus balloon guide catheter and over a phenom 21 microcatheter and a synchro support microguidewire into the cavernous segment of the right ICA. The microcatheter was then advanced over the microwire into a right M1/MCA middle division branch. A 6 mm solitaire stent retriever was deployed spanning the right M1/MCA and proximal M2/middle division branch. Device was allowed to intercalated with the clot for 4 minutes. The microcatheter was  removed under fluoroscopy. The guiding catheter balloon was inflated at the level of the proximal petrous segment. Thrombectomy device and aspiration catheter were then removed under constant aspiration. Follow-up angiogram showed recanalization of the M1 segment with subocclusive filling defect and slow flow in distal MCA branches (TICI 2C). A large bore aspiration catheter was again navigated through the walrus balloon guide catheter and over a phenom 21 microcatheter and a synchro support microguidewire into the cavernous segment of the right ICA. The microcatheter was then advanced over the microwire into a right M1/MCA posterior division branch. A 6 mm solitaire stent retriever was deployed spanning the right M1/MCA and proximal M2 posterior  division branch. Device was allowed to intercalated with the clot for 4 minutes. The microcatheter was removed under fluoroscopy. The guiding catheter balloon was inflated at the level of the proximal petrous segment. Thrombectomy device and aspiration catheter were then removed under constant aspiration. Follow-up angiogram show normal forward flow into the right MCA vascular territory noting a small dissection flap in the mid/distal right M1/MCA segment. Delayed follow-up angiogram showed progressive reocclusion of the right M1/MCA with minimal forward flow. Flat panel CT of the head was obtained and post processed in a separate workstation under concurrent attending physician supervision. No evidence of hemorrhagic complication noted. Right ICA angiograms with frontal lateral views of the head were obtained. Further progression of occlusion in the right M1 segment was noted. Small non flow limiting iatrogenic dissection in the upper cervical segment of the right ICA identified. The large bore aspiration catheter was navigated through the walrus balloon guide catheter and over a SL 10 microcatheter and a synchro support microguidewire into the cavernous segment of the right ICA. The microcatheter was then advanced over the microwire into a right M1/MCA posterior division branch. Intra arterial infusion of 19 mg of Integrilin was performed into the right ICA. Subsequently, a 3 mm x 15 mm atlas intracranial stent was deployed in the M1 segment across the dissection flap. Follow-up angiogram showed adequate stent positioning with complete restoration of the anterograde flow in the right MCA. Delayed follow-up angiogram (x2) show no evidence of reocclusion. 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 Postprocedural flat panel CT of the head was obtained and post processed in a separate workstation under concurrent attending physician supervision. No evidence of  hemorrhagic complication noted. The catheter system was withdrawn. A right common femoral artery angiogram was obtained showing access at the level of the right common femoral artery. The femoral sheath was removed and the access was closed with a Perclose ProGlide. Immediate hemostasis achieved. 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 FINDINGS: Dissection of the mid/distal right M1/MCA. IMPRESSION: 1. Mechanical thrombectomy performed (x2) with temporary restoration of flow revealing dissection flap in the mid/distal right M1/MCA segment. 2. Intracranial stenting placed in the right M1/MCA across the dissection flap with complete restoration of flow. 3. No hemorrhagic complication on postprocedural flat panel CT. No embolus to new territory. PLAN: Patient is to remain on Integrilin drip. A follow-up head CT will be obtained at 6:30 p.m. today. At this point, patient will be re-evaluated for transition to oral dual anti-platelet therapy. Electronically Signed   By: Pedro Earls M.D.   On: 06/04/2019 17:40   IR CT Head Ltd  Result Date: 06/04/2019 CLINICAL DATA:  59 year old male with past medical history significant for colon cancer in 2012. He developed  left-sided weakness and neglect on awakening this morning. [facility]. Head CT showed small hypodensity in the right insula with no hemorrhage. NIHSS 10. CT angiogram showed tapering of the right M1/MCA with short-segment filling defect. CT perfusion showed an estimated core volume of 25 mL with a mismatch of 126 mL. He was transferred to our service for emergency treatment of the right M1/MCA occlusion. EXAM: IR CT HEAD LIMITED; IR PERCUTANEOUS ART THORMBECTOMY/INFUSION INTRACRANIAL INCLUDE DIAG ANGIO; IR ULTRASOUND GUIDANCE VASC ACCESS RIGHT; INTRACRANIAL STENT (INCL PTA) ANESTHESIA/SEDATION: General anesthesia. MEDICATIONS: Intra arterial Verapamil 5 mg;  intra arterial Integrilin 19 mg. CONTRAST:  45m OMNIPAQUE IOHEXOL 300 MG/ML SOLN, 774mOMNIPAQUE IOHEXOL 240 MG/ML SOLN PROCEDURE: Operator: Dr. KaPedro EarlsMD. No healthcare proxy or next of kin available for informed consent. Discussion held with the neurohospitalist attending. Agreement reached that the procedure would be in the best interest of the patient and benefits outweighed risks. The patient was made to lie supine on the angiography table. He was prepped and draped utilizing the usual sterile technique. Using gray scale ultrasound-guided, a micropuncture kit and modified Seldinger technique, access was gained to the right common femoral artery. An 8 French femoral sheath was then placed. Under fluoroscopy, an 8 FrPakistanalrus balloon guide catheter was navigated over a 6 FrPakistanerenstein 2 catheter and a 0.038 inch Terumo Glidewire into the aortic arch. Under fluoroscopy, the catheter was advanced into the right common carotid artery and then into the right internal carotid artery. Frontal and lateral angiograms of the head were obtained showing a distal right M1/MCA occlusion with minimal delayed penetration of contrast. A large bore aspiration catheter was then navigated through the walrus balloon guide catheter and over a phenom 21 microcatheter and a synchro support microguidewire into the cavernous segment of the right ICA. The microcatheter was then advanced over the microwire into a right M1/MCA middle division branch. A 6 mm solitaire stent retriever was deployed spanning the right M1/MCA and proximal M2/middle division branch. Device was allowed to intercalated with the clot for 4 minutes. The microcatheter was removed under fluoroscopy. The guiding catheter balloon was inflated at the level of the proximal petrous segment. Thrombectomy device and aspiration catheter were then removed under constant aspiration. Follow-up angiogram showed recanalization of the M1 segment with  subocclusive filling defect and slow flow in distal MCA branches (TICI 2C). A large bore aspiration catheter was again navigated through the walrus balloon guide catheter and over a phenom 21 microcatheter and a synchro support microguidewire into the cavernous segment of the right ICA. The microcatheter was then advanced over the microwire into a right M1/MCA posterior division branch. A 6 mm solitaire stent retriever was deployed spanning the right M1/MCA and proximal M2 posterior division branch. Device was allowed to intercalated with the clot for 4 minutes. The microcatheter was removed under fluoroscopy. The guiding catheter balloon was inflated at the level of the proximal petrous segment. Thrombectomy device and aspiration catheter were then removed under constant aspiration. Follow-up angiogram show normal forward flow into the right MCA vascular territory noting a small dissection flap in the mid/distal right M1/MCA segment. Delayed follow-up angiogram showed progressive reocclusion of the right M1/MCA with minimal forward flow. Flat panel CT of the head was obtained and post processed in a separate workstation under concurrent attending physician supervision. No evidence of hemorrhagic complication noted. Right ICA angiograms with frontal lateral views of the head were obtained. Further progression of occlusion  in the right M1 segment was noted. Small non flow limiting iatrogenic dissection in the upper cervical segment of the right ICA identified. The large bore aspiration catheter was navigated through the walrus balloon guide catheter and over a SL 10 microcatheter and a synchro support microguidewire into the cavernous segment of the right ICA. The microcatheter was then advanced over the microwire into a right M1/MCA posterior division branch. Intra arterial infusion of 19 mg of Integrilin was performed into the right ICA. Subsequently, a 3 mm x 15 mm atlas intracranial stent was deployed in the M1  segment across the dissection flap. Follow-up angiogram showed adequate stent positioning with complete restoration of the anterograde flow in the right MCA. Delayed follow-up angiogram (x2) show no evidence of reocclusion. 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 Postprocedural flat panel CT of the head was obtained and post processed in a separate workstation under concurrent attending physician supervision. No evidence of hemorrhagic complication noted. The catheter system was withdrawn. A right common femoral artery angiogram was obtained showing access at the level of the right common femoral artery. The femoral sheath was removed and the access was closed with a Perclose ProGlide. Immediate hemostasis achieved. 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 FINDINGS: Dissection of the mid/distal right M1/MCA. IMPRESSION: 1. Mechanical thrombectomy performed (x2) with temporary restoration of flow revealing dissection flap in the mid/distal right M1/MCA segment. 2. Intracranial stenting placed in the right M1/MCA across the dissection flap with complete restoration of flow. 3. No hemorrhagic complication on postprocedural flat panel CT. No embolus to new territory. PLAN: Patient is to remain on Integrilin drip. A follow-up head CT will be obtained at 6:30 p.m. today. At this point, patient will be re-evaluated for transition to oral dual anti-platelet therapy. Electronically Signed   By: Pedro Earls M.D.   On: 06/04/2019 17:40   IR CT Head Ltd  Result Date: 06/04/2019 CLINICAL DATA:  59 year old male with past medical history significant for colon cancer in 2012. He developed left-sided weakness and neglect on awakening this morning. He was taken to [facility] hospital. Head CT showed small hypodensity in the right insula with no hemorrhage. NIHSS 10. CT angiogram showed  tapering of the right M1/MCA with short-segment filling defect. CT perfusion showed an estimated core volume of 25 mL with a mismatch of 126 mL. He was transferred to our service for emergency treatment of the right M1/MCA occlusion. EXAM: IR CT HEAD LIMITED; IR PERCUTANEOUS ART THORMBECTOMY/INFUSION INTRACRANIAL INCLUDE DIAG ANGIO; IR ULTRASOUND GUIDANCE VASC ACCESS RIGHT; INTRACRANIAL STENT (INCL PTA) ANESTHESIA/SEDATION: General anesthesia. MEDICATIONS: Intra arterial Verapamil 5 mg; intra arterial Integrilin 19 mg. CONTRAST:  35m OMNIPAQUE IOHEXOL 300 MG/ML SOLN, 713mOMNIPAQUE IOHEXOL 240 MG/ML SOLN PROCEDURE: Operator: Dr. KaPedro EarlsMD. No healthcare proxy or next of kin available for informed consent. Discussion held with the neurohospitalist attending. Agreement reached that the procedure would be in the best interest of the patient and benefits outweighed risks. The patient was made to lie supine on the angiography table. He was prepped and draped utilizing the usual sterile technique. Using gray scale ultrasound-guided, a micropuncture kit and modified Seldinger technique, access was gained to the right common femoral artery. An 8 French femoral sheath was then placed. Under fluoroscopy, an 8 Fr[place]alrus balloon guide catheter was navigated over a 6 FrPakistanerenstein 2 catheter and a 0.038 inch  Terumo Glidewire into the aortic arch. Under fluoroscopy, the catheter was advanced into the right common carotid artery and then into the right internal carotid artery. Frontal and lateral angiograms of the head were obtained showing a distal right M1/MCA occlusion with minimal delayed penetration of contrast. A large bore aspiration catheter was then navigated through the walrus balloon guide catheter and over a phenom 21 microcatheter and a synchro support microguidewire into the cavernous segment of the right ICA. The microcatheter was then advanced over the microwire into a right M1/MCA  middle division branch. A 6 mm solitaire stent retriever was deployed spanning the right M1/MCA and proximal M2/middle division branch. Device was allowed to intercalated with the clot for 4 minutes. The microcatheter was removed under fluoroscopy. The guiding catheter balloon was inflated at the level of the proximal petrous segment. Thrombectomy device and aspiration catheter were then removed under constant aspiration. Follow-up angiogram showed recanalization of the M1 segment with subocclusive filling defect and slow flow in distal MCA branches (TICI 2C). A large bore aspiration catheter was again navigated through the walrus balloon guide catheter and over a phenom 21 microcatheter and a synchro support microguidewire into the cavernous segment of the right ICA. The microcatheter was then advanced over the microwire into a right M1/MCA posterior division branch. A 6 mm solitaire stent retriever was deployed spanning the right M1/MCA and proximal M2 posterior division branch. Device was allowed to intercalated with the clot for 4 minutes. The microcatheter was removed under fluoroscopy. The guiding catheter balloon was inflated at the level of the proximal petrous segment. Thrombectomy device and aspiration catheter were then removed under constant aspiration. Follow-up angiogram show normal forward flow into the right MCA vascular territory noting a small dissection flap in the mid/distal right M1/MCA segment. Delayed follow-up angiogram showed progressive reocclusion of the right M1/MCA with minimal forward flow. Flat panel CT of the head was obtained and post processed in a separate workstation under concurrent attending physician supervision. No evidence of hemorrhagic complication noted. Right ICA angiograms with frontal lateral views of the head were obtained. Further progression of occlusion in the right M1 segment was noted. Small non flow limiting iatrogenic dissection in the upper cervical segment of  the right ICA identified. The large bore aspiration catheter was navigated through the walrus balloon guide catheter and over a SL 10 microcatheter and a synchro support microguidewire into the cavernous segment of the right ICA. The microcatheter was then advanced over the microwire into a right M1/MCA posterior division branch. Intra arterial infusion of 19 mg of Integrilin was performed into the right ICA. Subsequently, a 3 mm x 15 mm atlas intracranial stent was deployed in the M1 segment across the dissection flap. Follow-up angiogram showed adequate stent positioning with complete restoration of the anterograde flow in the right MCA. Delayed follow-up angiogram (x2) show no evidence of reocclusion. 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 Postprocedural flat panel CT of the head was obtained and post processed in a separate workstation under concurrent attending physician supervision. No evidence of hemorrhagic complication noted. The catheter system was withdrawn. A right common femoral artery angiogram was obtained showing access at the level of the right common femoral artery. The femoral sheath was removed and the access was closed with a Perclose ProGlide. Immediate hemostasis achieved. 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 FINDINGS: Dissection of the mid/distal right M1/MCA. IMPRESSION:  1. Mechanical thrombectomy performed (x2) with temporary restoration of flow revealing dissection flap in the mid/distal right M1/MCA segment. 2. Intracranial stenting placed in the right M1/MCA across the dissection flap with complete restoration of flow. 3. No hemorrhagic complication on postprocedural flat panel CT. No embolus to new territory. PLAN: Patient is to remain on Integrilin drip. A follow-up head CT will be obtained at 6:30 p.m. today. At this point, patient will be re-evaluated for  transition to oral dual anti-platelet therapy. Electronically Signed   By: Pedro Earls M.D.   On: 06/04/2019 17:40   IR US Guide Vasc Access Right  Result Date: 06/04/2019 CLINICAL DATA:  60 year old male with past medical history significant for colon cancer in 2012. He developed left-sided weakness and neglect on awakening this morning. He was taken to [facility] hospital. Head CT showed small hypodensity in the right insula with no hemorrhage. NIHSS 10. CT angiogram showed tapering of the right M1/MCA with short-segment filling defect. CT perfusion showed an estimated core volume of 25 mL with a mismatch of 126 mL. He was transferred to our service for emergency treatment of the right M1/MCA occlusion. EXAM: IR CT HEAD LIMITED; IR PERCUTANEOUS ART THORMBECTOMY/INFUSION INTRACRANIAL INCLUDE DIAG ANGIO; IR ULTRASOUND GUIDANCE VASC ACCESS RIGHT; INTRACRANIAL STENT (INCL PTA) ANESTHESIA/SEDATION: General anesthesia. MEDICATIONS: Intra arterial Verapamil 5 mg; intra arterial Integrilin 19 mg. CONTRAST:  10m OMNIPAQUE IOHEXOL 300 MG/ML SOLN, 749mOMNIPAQUE IOHEXOL 240 MG/ML SOLN PROCEDURE: Operator: Dr. KaPedro EarlsMD. No healthcare proxy or next of kin available for informed consent. Discussion held with the neurohospitalist attending. Agreement reached that the procedure would be in the best interest of the patient and benefits outweighed risks. The patient was made to lie supine on the angiography table. He was prepped and draped utilizing the usual sterile technique. Using gray scale ultrasound-guided, a micropuncture kit and modified Seldinger technique, access was gained to the right common femoral artery. An 8 French femoral sheath was then placed. Under fluoroscopy, an 8 Fr[place]alrus balloon guide catheter was navigated over a 6 FrPakistanerenstein 2 catheter and a 0.038 inch Terumo Glidewire into the aortic arch. Under fluoroscopy, the catheter was advanced into  the right common carotid artery and then into the right internal carotid artery. Frontal and lateral angiograms of the head were obtained showing a distal right M1/MCA occlusion with minimal delayed penetration of contrast. A large bore aspiration catheter was then navigated through the walrus balloon guide catheter and over a phenom 21 microcatheter and a synchro support microguidewire into the cavernous segment of the right ICA. The microcatheter was then advanced over the microwire into a right M1/MCA middle division branch. A 6 mm solitaire stent retriever was deployed spanning the right M1/MCA and proximal M2/middle division branch. Device was allowed to intercalated with the clot for 4 minutes. The microcatheter was removed under fluoroscopy. The guiding catheter balloon was inflated at the level of the proximal petrous segment. Thrombectomy device and aspiration catheter were then removed under constant aspiration. Follow-up angiogram showed recanalization of the M1 segment with subocclusive filling defect and slow flow in distal MCA branches (TICI 2C). A large bore aspiration catheter was again navigated through the walrus balloon guide catheter and over a phenom 21 microcatheter and a synchro support microguidewire into the cavernous segment of the right ICA. The microcatheter was then advanced over the microwire into a right M1/MCA posterior division branch. A 6 mm solitaire stent retriever was deployed spanning the right  M1/MCA and proximal M2 posterior division branch. Device was allowed to intercalated with the clot for 4 minutes. The microcatheter was removed under fluoroscopy. The guiding catheter balloon was inflated at the level of the proximal petrous segment. Thrombectomy device and aspiration catheter were then removed under constant aspiration. Follow-up angiogram show normal forward flow into the right MCA vascular territory noting a small dissection flap in the mid/distal right M1/MCA segment.  Delayed follow-up angiogram showed progressive reocclusion of the right M1/MCA with minimal forward flow. Flat panel CT of the head was obtained and post processed in a separate workstation under concurrent attending physician supervision. No evidence of hemorrhagic complication noted. Right ICA angiograms with frontal lateral views of the head were obtained. Further progression of occlusion in the right M1 segment was noted. Small non flow limiting iatrogenic dissection in the upper cervical segment of the right ICA identified. The large bore aspiration catheter was navigated through the walrus balloon guide catheter and over a SL 10 microcatheter and a synchro support microguidewire into the cavernous segment of the right ICA. The microcatheter was then advanced over the microwire into a right M1/MCA posterior division branch. Intra arterial infusion of 19 mg of Integrilin was performed into the right ICA. Subsequently, a 3 mm x 15 mm atlas intracranial stent was deployed in the M1 segment across the dissection flap. Follow-up angiogram showed adequate stent positioning with complete restoration of the anterograde flow in the right MCA. Delayed follow-up angiogram (x2) show no evidence of reocclusion. 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 Postprocedural flat panel CT of the head was obtained and post processed in a separate workstation under concurrent attending physician supervision. No evidence of hemorrhagic complication noted. The catheter system was withdrawn. A right common femoral artery angiogram was obtained showing access at the level of the right common femoral artery. The femoral sheath was removed and the access was closed with a Perclose ProGlide. Immediate hemostasis achieved. 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 FINDINGS: Dissection of the mid/distal right M1/MCA. IMPRESSION: 1.  Mechanical thrombectomy performed (x2) with temporary restoration of flow revealing dissection flap in the mid/distal right M1/MCA segment. 2. Intracranial stenting placed in the right M1/MCA across the dissection flap with complete restoration of flow. 3. No hemorrhagic complication on postprocedural flat panel CT. No embolus to new territory. PLAN: Patient is to remain on Integrilin drip. A follow-up head CT will be obtained at 6:30 p.m. today. At this point, patient will be re-evaluated for transition to oral dual anti-platelet therapy. Electronically Signed   By: Pedro Earls M.D.   On: 06/04/2019 17:40   CT CEREBRAL PERFUSION W CONTRAST  Addendum Date: 06/04/2019   ADDENDUM REPORT: 06/04/2019 12:21 ADDENDUM: Contrast dose is 100 mL Omnipaque 350 Electronically Signed   By: Macy Mis M.D.   On: 06/04/2019 12:21   Result Date: 06/04/2019 CLINICAL DATA:  Code stroke.  Slurred speech and left facial droop EXAM: CT ANGIOGRAPHY HEAD AND NECK CT PERFUSION BRAIN TECHNIQUE: Multidetector CT imaging of the head and neck was performed using the standard protocol during bolus administration of intravenous contrast. Multiplanar CT image reconstructions and MIPs were obtained to evaluate the vascular anatomy. Carotid stenosis measurements (when applicable) are obtained utilizing NASCET criteria, using the distal internal carotid diameter as the denominator. Multiphase CT imaging of the brain was performed following IV bolus contrast injection. Subsequent parametric perfusion maps  were calculated using RAPID software. COMPARISON:  None. FINDINGS: CT HEAD Brain: There is no acute intracranial hemorrhage mass effect edema. Gray-white differentiation is preserved. Ventricles and sulci normal in size and configuration. There is no extra-axial fluid collection. Vascular: Hyperdense vessel. Skull: Unremarkable. Sinuses/Orbits: Lobular mucosal thickening with greatest involvement of the right maxillary  sinus. No acute orbital finding. Other: Mastoid air cells are clear ASPECTS [facility] Stroke Program Early CT Score) - Ganglionic level infarction (caudate, lentiform nuclei, internal capsule, insula, M1-M3 cortex): 7 - Supraganglionic infarction (M4-M6 cortex): 3 Total score (0-10 with 10 being normal): 10 Review of the MIP images confirms the above findings CTA NECK Aortic arch: Great vessel origins are patent. Right carotid system: Patent. There is trace calcified plaque at the ICA origin measurable stenosis. Left carotid system: Patent. Eccentric noncalcified plaque causing less than 50% stenosis. Mild calcified plaque at the ICA origin causing less than 50% stenosis. Vertebral arteries: Patent.  Left vertebral artery dominant. Skeleton: Cervical spine degenerative changes. Other neck: No mass or adenopathy Upper chest: No apical lung mass. Review of the MIP images confirms the above findings CTA HEAD Anterior circulation: Intracranial internal carotid arteries patent. There is nonocclusive thrombus within distal right M1 MCA extending to the bifurcation. Left middle cerebral artery is patent. Anterior cerebral arteries are patent. Congenitally absent right A1 ACA. Posterior circulation: Intracranial vertebral arteries, basilar artery, and posterior cerebral arteries are patent. There are bilateral patent posterior communicating arteries. Venous sinuses: As permitted by contrast timing, patent. Review of the MIP images confirms the above findings CT Brain Perfusion: CBF (<30%) Volume: 64m Perfusion (Tmax>6.0s) volume: 1562mMismatch Volume: 12657mnfarction Location: Right MCA territory IMPRESSION: No acute intracranial hemorrhage or evidence of acute infarction. ASPECT score is 10. 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 Perfusion imaging demonstrates 25 mL core infarction and 126 mL territory at risk in the right MCA territory. No hemodynamically significant stenosis  in the neck. These results were called by telephone at the time of interpretation on 06/04/2019 at 11:53 am to provider [facility]ONSwed[facility] CampusLESAlexis Goodellwho verbally acknowledged these results. Electronically Signed: By: PraMacy MisD. On: 06/04/2019 11:59   DG CHEST PORT 1 VIEW  Result Date: 06/05/2019 CLINICAL DATA:  NG tube placement. EXAM: PORTABLE CHEST 1 VIEW COMPARISON:  CT angiogram of the chest dated 09/13/2002 FINDINGS: NG tube tip is below the diaphragm. Heart size and pulmonary vascularity are normal. Lungs are clear. No significant bone abnormality. IMPRESSION: Normal chest.  NG tube tip below the diaphragm. Electronically Signed   By: JamLorriane ShireD.   On: 06/05/2019 14:29   DG Abd Portable 1V  Result Date: 06/05/2019 CLINICAL DATA:  Nasogastric tube placement. EXAM: PORTABLE ABDOMEN - 1 VIEW COMPARISON:  Same day. FINDINGS: The bowel gas pattern is normal. Distal tip of nasogastric tube is seen in expected position of the gastroesophageal junction. No radio-opaque calculi or other significant radiographic abnormality are seen. IMPRESSION: Distal tip of nasogastric tube seen in expected position of gastroesophageal junction. Proximal side hole is seen in distal esophagus. Advancement is recommended. Electronically Signed   By: JamMarijo ConceptionD.   On: 06/05/2019 12:01   DG Abd Portable 1V  Result Date: 06/05/2019 CLINICAL DATA:  Nasogastric tube placement EXAM: PORTABLE ABDOMEN - 1 VIEW COMPARISON:  Portable exam 1050 hours compared to 0858 hours FINDINGS: Tip of nasogastric tube projects over stomach though the proximal side-port projects over the distal esophagus; recommend  advancing tube 5 cm. Nonobstructive bowel gas pattern. No bowel dilatation or bowel wall thickening. Osseous structures unremarkable. IMPRESSION: Proximal side-port of nasogastric tube projects over distal esophagus; recommend advancing tube 5 cm. Electronically Signed   By: Lavonia Dana M.D.   On:  06/05/2019 11:00   DG Abd Portable 1V  Result Date: 06/05/2019 CLINICAL DATA:  NG tube placement EXAM: PORTABLE ABDOMEN - 1 VIEW COMPARISON:  06/04/2019 FINDINGS: Esophagogastric tube remains with tip below the diaphragm and side port above the gastroesophageal junction. General paucity of bowel although scattered gas is gas present to the rectum. No free air on supine radiographs. IMPRESSION: Esophagogastric tube remains with tip below the diaphragm and side port above the gastroesophageal junction. Recommend advancement to ensure subdiaphragmatic positioning of tip and side port. Electronically Signed   By: Eddie Candle M.D.   On: 06/05/2019 09:25   DG Swallowing Func-Speech Pathology  Result Date: 06/06/2019 Objective Swallowing Evaluation: Type of Study: MBS-Modified Barium Swallow Study  Patient Details Name: Scott Day MRN: [id number] Date of Birth: Apr 24, 1960 Today's Date: 06/06/2019 Time: SLP Start Time (ACUTE ONLY): 4193 -SLP Stop Time (ACUTE ONLY): 1416 SLP Time Calculation (min) (ACUTE ONLY): 19 min Past Medical History: Past Medical History: Diagnosis Date . Cancer [facility]) 2012  colon cancer . Closed fracture of left distal radius  . GERD (gastroesophageal reflux disease)  Past Surgical History: Past Surgical History: Procedure Laterality Date . COLON SURGERY  2012  colon cancer . IR CT HEAD LTD  06/04/2019 . IR CT HEAD LTD  06/04/2019 . IR INTRA CRAN STENT  06/04/2019 . IR PERCUTANEOUS ART THROMBECTOMY/INFUSION INTRACRANIAL INC DIAG ANGIO  06/04/2019 . IR US GUIDE VASC ACCESS RIGHT  06/04/2019 . OPEN REDUCTION INTERNAL FIXATION (ORIF) DISTAL RADIAL FRACTURE Left 09/20/2017  Procedure: OPEN REDUCTION INTERNAL FIXATION (ORIF) DISTAL RADIAL FRACTURE;  Surgeon: Leanora Cover, MD;  Location: [place];  Service: Orthopedics;  Laterality: Left; . RADIOLOGY WITH ANESTHESIA N/A 06/04/2019  Procedure: IR WITH ANESTHESIA;  Surgeon: Radiologist, Medication, MD;  Location: [place];  Service: Radiology;   Laterality: N/A; HPI: Scott Day is a 59 y.o. male with a history of colon cancer in 2012 and GERD, who presents with left-sided weakness. 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  Subjective: cooperative Assessment / Plan / Recommendation CHL IP CLINICAL IMPRESSIONS 06/06/2019 Clinical Impression Patient presents with mild oropharyngeal dysphagia. Oral phase is remarkable for prolonged mastication and lingual residue. Pharyngeal phase is remarkable for reduced epiglottic inversion that leads to reduced laryngeal closure, resulting in penetration (PAS 4 and 5) and vallecular residue. Pt noted with throat clearing during the study following penetration, and was able to clear penetrate the majority of the time. Prior to administering POs, pt was also noted with grunting and throat clearing. Given the prolonged transit with regular solids d/t missing dentention, recommend Dys 3 and thin liquids, and meds whole in puree. Ensure the pt is following general aspiration precautions (small bites/sips, upright while eating/drinking).  SLP Visit Diagnosis Dysphagia, oropharyngeal phase (R13.12) Attention and concentration deficit following -- Frontal lobe and executive function deficit following -- Impact on safety and function Mild aspiration risk   CHL IP TREATMENT RECOMMENDATION 06/06/2019 Treatment Recommendations Therapy as outlined in treatment plan below   Prognosis 06/06/2019 Prognosis for Safe Diet Advancement Good Barriers to Reach Goals Cognitive deficits Barriers/Prognosis Comment -- CHL IP DIET RECOMMENDATION 06/06/2019 SLP Diet Recommendations Dysphagia 3 (Mech soft) solids;Thin liquid Liquid Administration via [employer]  Medication Administration Whole meds with puree Compensations Minimize environmental distractions;Slow rate;Small sips/bites Postural Changes Seated upright at 90 degrees   CHL IP OTHER RECOMMENDATIONS 06/06/2019 Recommended Consults -- Oral  Care Recommendations Oral care BID Other Recommendations Have oral suction available   CHL IP FOLLOW UP RECOMMENDATIONS 06/06/2019 Follow up Recommendations Inpatient Rehab   CHL IP FREQUENCY AND DURATION 06/06/2019 Speech Therapy Frequency (ACUTE ONLY) min 2x/week Treatment Duration 2 weeks      CHL IP ORAL PHASE 06/06/2019 Oral Phase Impaired Oral - Pudding Teaspoon -- Oral - Pudding Cup -- Oral - Honey Teaspoon -- Oral - Honey Cup -- Oral - Nectar Teaspoon -- Oral - Nectar Cup -- Oral - Nectar Straw -- Oral - Thin Teaspoon WFL Oral - Thin Cup Lingual/palatal residue Oral - Thin Straw Lingual/palatal residue Oral - Puree Lingual/palatal residue Oral - Mech Soft -- Oral - Regular Impaired mastication Oral - Multi-Consistency -- Oral - Pill WFL Oral Phase - Comment --  CHL IP PHARYNGEAL PHASE 06/06/2019 Pharyngeal Phase Impaired Pharyngeal- Pudding Teaspoon -- Pharyngeal -- Pharyngeal- Pudding Cup -- Pharyngeal -- Pharyngeal- Honey Teaspoon -- Pharyngeal -- Pharyngeal- Honey Cup -- Pharyngeal -- Pharyngeal- Nectar Teaspoon -- Pharyngeal -- Pharyngeal- Nectar Cup -- Pharyngeal -- Pharyngeal- Nectar Straw -- Pharyngeal -- Pharyngeal- Thin Teaspoon WFL Pharyngeal -- Pharyngeal- Thin Cup Penetration/Aspiration during swallow;Reduced airway/laryngeal closure Pharyngeal Material enters airway, CONTACTS cords and then ejected out Pharyngeal- Thin Straw Reduced airway/laryngeal closure;Penetration/Aspiration during swallow Pharyngeal Material enters airway, CONTACTS cords and then ejected out;Material enters airway, CONTACTS cords and not ejected out Pharyngeal- Puree Pharyngeal residue - valleculae Pharyngeal -- Pharyngeal- Mechanical Soft -- Pharyngeal -- Pharyngeal- Regular Pharyngeal residue - valleculae Pharyngeal -- Pharyngeal- Multi-consistency -- Pharyngeal -- Pharyngeal- Pill WFL Pharyngeal -- Pharyngeal Comment --  CHL IP CERVICAL ESOPHAGEAL PHASE 06/06/2019 Cervical Esophageal Phase WFL Pudding Teaspoon -- Pudding Cup  -- Honey Teaspoon -- Honey Cup -- Nectar Teaspoon -- Nectar Cup -- Nectar Straw -- Thin Teaspoon -- Thin Cup -- Thin Straw -- Puree -- Mechanical Soft -- Regular -- Multi-consistency -- Pill -- Cervical Esophageal Comment -- Osie Bond., M.A. CCC-SLP Acute Rehabilitation Services Pager [phone] Office [phone] 06/06/2019, 3:14 PM              ECHOCARDIOGRAM COMPLETE  Result Date: 06/05/2019    ECHOCARDIOGRAM REPORT   Patient Name:   Scott Day Date of Exam: 06/05/2019 Medical Rec #:  [id number]      Height:       71.0 in Accession #:    [id number]     Weight:       231.5 lb Date of Birth:  [birth year]      BSA:          2.244 m Patient Age:    10 years       BP:           126/67 mmHg Patient Gender: M              HR:           70 bpm. Exam Location:  Inpatient Procedure: 2D Echo, Cardiac Doppler and Color Doppler Indications:    Stroke 434.91/I163.9  History:        Patient has no prior history of Echocardiogram examinations.                 Risk Factors:Former Smoker. CVA.  Sonographer:    Clayton Lefort RDCS (AE) Referring Phys: [phone] MCNEILL P [place]  1. Left  ventricular ejection fraction, by estimation, is 65 to 70%. The left ventricle has normal function. The left ventricle has no regional wall motion abnormalities. There is severe left ventricular hypertrophy. Left ventricular diastolic parameters  were normal.  2. Right ventricular systolic function is normal. The right ventricular size is normal.  3. The mitral valve is abnormal. Trivial mitral valve regurgitation.  4. The aortic valve is tricuspid. Aortic valve regurgitation is not visualized. No aortic stenosis is present.  5. The inferior vena cava is normal in size with <50% respiratory variability, suggesting right atrial pressure of 8 mmHg. FINDINGS  Left Ventricle: Left ventricular ejection fraction, by estimation, is 65 to 70%. The left ventricle has normal function. The left ventricle has no regional wall motion  abnormalities. The left ventricular internal cavity size was normal in size. There is  severe left ventricular hypertrophy. Left ventricular diastolic parameters were normal. Right Ventricle: The right ventricular size is normal. No increase in right ventricular wall thickness. Right ventricular systolic function is normal. Left Atrium: Left atrial size was normal in size. Right Atrium: Right atrial size was normal in size. Pericardium: There is no evidence of pericardial effusion. Mitral Valve: The mitral valve is abnormal. There is mild thickening of the mitral valve leaflet(s). Trivial mitral valve regurgitation. MV peak gradient, 3.6 mmHg. The mean mitral valve gradient is 1.0 mmHg. Tricuspid Valve: The tricuspid valve is grossly normal. Tricuspid valve regurgitation is trivial. Aortic Valve: The aortic valve is tricuspid. Aortic valve regurgitation is not visualized. No aortic stenosis is present. Pulmonic Valve: The pulmonic valve was grossly normal. Pulmonic valve regurgitation is not visualized. Aorta: The aortic root, ascending aorta, aortic arch and descending aorta are all structurally normal, with no evidence of dilitation or obstruction. Venous: The inferior vena cava is normal in size with less than 50% respiratory variability, suggesting right atrial pressure of 8 mmHg. IAS/Shunts: The interatrial septum was not well visualized.  LEFT VENTRICLE PLAX 2D LVIDd:         4.02 cm  Diastology LVIDs:         2.40 cm  LV e' lateral:   10.40 cm/s LV PW:         1.68 cm  LV E/e' lateral: 9.0 LV IVS:        1.71 cm  LV e' medial:    9.79 cm/s LVOT diam:     2.10 cm  LV E/e' medial:  9.6 LV SV:         80 LV SV Index:   36 LVOT Area:     3.46 cm  RIGHT VENTRICLE             IVC RV Basal diam:  2.29 cm     IVC diam: 1.86 cm RV S prime:     14.10 cm/s TAPSE (M-mode): 2.7 cm LEFT ATRIUM             Index       RIGHT ATRIUM           Index LA diam:        2.70 cm 1.20 cm/m  RA Area:     11.90 cm LA Vol (A2C):    52.4 ml 23.35 ml/m RA Volume:   23.60 ml  10.52 ml/m LA Vol (A4C):   40.7 ml 18.14 ml/m LA Biplane Vol: 47.6 ml 21.21 ml/m  AORTIC VALVE AV Area (Vmean):   3.20 cm AV Area (VTI):     3.24 cm AV Vmean:  88.200 cm/s AV VTI:            0.246 m LVOT Vmax:         118.00 cm/s LVOT Vmean:        81.500 cm/s LVOT VTI:          0.230 m LVOT/AV VTI ratio: 0.93  AORTA Ao Root diam: 3.40 cm Ao Asc diam:  3.60 cm MITRAL VALVE MV Area (PHT): 3.27 cm    SHUNTS MV Peak grad:  3.6 mmHg    Systemic VTI:  0.23 m MV Mean grad:  1.0 mmHg    Systemic Diam: 2.10 cm MV Vmax:       0.94 m/s MV Vmean:      56.1 cm/s MV Decel Time: 232 msec MV E velocity: 94.10 cm/s MV A velocity: 80.00 cm/s MV E/A ratio:  1.18 Lyman Bishop MD Electronically signed by Lyman Bishop MD Signature Date/Time: 06/05/2019/11:23:20 AM    Final    ECHO TEE  Result Date: 06/08/2019    TRANSESOPHOGEAL ECHO REPORT   Patient Name:   Scott Day Date of Exam: 06/08/2019 Medical Rec #:  [id number]      Height:       71.0 in Accession #:    [id number]     Weight:       231.5 lb Date of Birth:  05-13-1960      BSA:          2.244 m Patient Age:    84 years       BP:           125/80 mmHg Patient Gender: M              HR:           97 bpm. Exam Location:  Inpatient Procedure: Transesophageal Echo and Saline Contrast Bubble Study Indications:     Stroke  History:         Patient has prior history of Echocardiogram examinations, most                  recent 06/05/2019. Risk Factors:Former Smoker and Hypertension.  Sonographer:     Clayton Lefort RDCS (AE) Referring Phys:  [place] Diagnosing Phys: Buford Dresser MD PROCEDURE: After discussion of the risks and benefits of a TEE, an informed consent was obtained from the patient. The transesophogeal probe was passed without difficulty through the esophogus of the patient. Sedation performed by different physician. The patient was monitored while under deep sedation. Anesthestetic sedation was  provided intravenously by Anesthesiology: 369m of Propofol, 628mof Lidocaine. Image quality was good. The patient's vital signs; including heart rate, blood pressure, and oxygen saturation; remained stable throughout the procedure. The patient developed no complications during the procedure. IMPRESSIONS  1. Left ventricular ejection fraction, by estimation, is 65 to 70%. The left ventricle has normal function. The left ventricle has no regional wall motion abnormalities. There is moderate concentric left ventricular hypertrophy. Left ventricular diastolic function could not be evaluated.  2. Right ventricular systolic function is normal. The right ventricular size is normal. Tricuspid regurgitation signal is inadequate for assessing PA pressure.  3. No left atrial/left atrial appendage thrombus was detected.  4. The mitral valve is normal in structure. Trivial mitral valve regurgitation. No evidence of mitral stenosis.  5. The aortic valve is tricuspid. Aortic valve regurgitation is not visualized. No aortic stenosis is present.  6. There is mild (Grade II) plaque involving the descending  aorta.  7. Agitated saline contrast bubble study was negative, with no evidence of any interatrial shunt. Conclusion(s)/Recommendation(s): No LA/LAA thrombus identified. Negative bubble study for interatrial shunt. No intracardiac source of embolism detected on this on this transesophageal echocardiogram. FINDINGS  Left Ventricle: Left ventricular ejection fraction, by estimation, is 65 to 70%. The left ventricle has normal function. The left ventricle has no regional wall motion abnormalities. The left ventricular internal cavity size was normal in size. There is  moderate concentric left ventricular hypertrophy. Left ventricular diastolic function could not be evaluated. Right Ventricle: The right ventricular size is normal. No increase in right ventricular wall thickness. Right ventricular systolic function is normal.  Tricuspid regurgitation signal is inadequate for assessing PA pressure. Left Atrium: Left atrial size was not assessed. No left atrial/left atrial appendage thrombus was detected. Right Atrium: Right atrial size was not assessed. Pericardium: There is no evidence of pericardial effusion. Mitral Valve: The mitral valve is normal in structure. Trivial mitral valve regurgitation. No evidence of mitral valve stenosis. Tricuspid Valve: The tricuspid valve is normal in structure. Tricuspid valve regurgitation is trivial. No evidence of tricuspid stenosis. Aortic Valve: The aortic valve is tricuspid. Aortic valve regurgitation is not visualized. No aortic stenosis is present. Pulmonic Valve: The pulmonic valve was grossly normal. Pulmonic valve regurgitation is trivial. No evidence of pulmonic stenosis. Aorta: The aortic root is normal in size and structure. There is mild (Grade II) plaque involving the descending aorta. IAS/Shunts: No atrial level shunt detected by color flow Doppler. Agitated saline contrast was given intravenously to evaluate for intracardiac shunting. Agitated saline contrast bubble study was negative, with no evidence of any interatrial shunt. There  is no evidence of a patent foramen ovale. Buford Dresser MD Electronically signed by Buford Dresser MD Signature Date/Time: 06/08/2019/2:06:11 PM    Final    IR PERCUTANEOUS ART THROMBECTOMY/INFUSION INTRACRANIAL INC DIAG ANGIO  Result Date: 06/04/2019 CLINICAL DATA:  59 year old male with past medical history significant for colon cancer in 2012. He developed left-sided weakness and neglect on awakening this morning. He was taken to [facility] hospital. Head CT showed small hypodensity in the right insula with no hemorrhage. NIHSS 10. CT angiogram showed tapering of the right M1/MCA with short-segment filling defect. CT perfusion showed an estimated core volume of 25 mL with a mismatch of 126 mL. He was transferred to our service  for emergency treatment of the right M1/MCA occlusion. EXAM: IR CT HEAD LIMITED; IR PERCUTANEOUS ART THORMBECTOMY/INFUSION INTRACRANIAL INCLUDE DIAG ANGIO; IR ULTRASOUND GUIDANCE VASC ACCESS RIGHT; INTRACRANIAL STENT (INCL PTA) ANESTHESIA/SEDATION: General anesthesia. MEDICATIONS: Intra arterial Verapamil 5 mg; intra arterial Integrilin 19 mg. CONTRAST:  39m OMNIPAQUE IOHEXOL 300 MG/ML SOLN, 712mOMNIPAQUE IOHEXOL 240 MG/ML SOLN PROCEDURE: Operator: Dr. KaPedro EarlsMD. No healthcare proxy or next of kin available for informed consent. Discussion held with the neurohospitalist attending. Agreement reached that the procedure would be in the best interest of the patient and benefits outweighed risks. The patient was made to lie supine on the angiography table. He was prepped and draped utilizing the usual sterile technique. Using gray scale ultrasound-guided, a micropuncture kit and modified Seldinger technique, access was gained to the right common femoral artery. An 8 French femoral sheath was then placed. Under fluoroscopy, an 8 FrPak[place]alrus balloon guide catheter was navigated over a 6 FrPakistanerenstein 2 catheter and a 0.038 inch Terumo Glidewire into the aortic arch. Under fluoroscopy, the catheter was advanced into the right common carotid  artery and then into the right internal carotid artery. Frontal and lateral angiograms of the head were obtained showing a distal right M1/MCA occlusion with minimal delayed penetration of contrast. A large bore aspiration catheter was then navigated through the walrus balloon guide catheter and over a phenom 21 microcatheter and a synchro support microguidewire into the cavernous segment of the right ICA. The microcatheter was then advanced over the microwire into a right M1/MCA middle division branch. A 6 mm solitaire stent retriever was deployed spanning the right M1/MCA and proximal M2/middle division branch. Device was allowed to intercalated with the  clot for 4 minutes. The microcatheter was removed under fluoroscopy. The guiding catheter balloon was inflated at the level of the proximal petrous segment. Thrombectomy device and aspiration catheter were then removed under constant aspiration. Follow-up angiogram showed recanalization of the M1 segment with subocclusive filling defect and slow flow in distal MCA branches (TICI 2C). A large bore aspiration catheter was again navigated through the walrus balloon guide catheter and over a phenom 21 microcatheter and a synchro support microguidewire into the cavernous segment of the right ICA. The microcatheter was then advanced over the microwire into a right M1/MCA posterior division branch. A 6 mm solitaire stent retriever was deployed spanning the right M1/MCA and proximal M2 posterior division branch. Device was allowed to intercalated with the clot for 4 minutes. The microcatheter was removed under fluoroscopy. The guiding catheter balloon was inflated at the level of the proximal petrous segment. Thrombectomy device and aspiration catheter were then removed under constant aspiration. Follow-up angiogram show normal forward flow into the right MCA vascular territory noting a small dissection flap in the mid/distal right M1/MCA segment. Delayed follow-up angiogram showed progressive reocclusion of the right M1/MCA with minimal forward flow. Flat panel CT of the head was obtained and post processed in a separate workstation under concurrent attending physician supervision. No evidence of hemorrhagic complication noted. Right ICA angiograms with frontal lateral views of the head were obtained. Further progression of occlusion in the right M1 segment was noted. Small non flow limiting iatrogenic dissection in the upper cervical segment of the right ICA identified. The large bore aspiration catheter was navigated through the walrus balloon guide catheter and over a SL 10 microcatheter and a synchro support  microguidewire into the cavernous segment of the right ICA. The microcatheter was then advanced over the microwire into a right M1/MCA posterior division branch. Intra arterial infusion of 19 mg of Integrilin was performed into the right ICA. Subsequently, a 3 mm x 15 mm atlas intracranial stent was deployed in the M1 segment across the dissection flap. Follow-up angiogram showed adequate stent positioning with complete restoration of the anterograde flow in the right MCA. Delayed follow-up angiogram (x2) show no evidence of reocclusion. 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 Postprocedural flat panel CT of the head was obtained and post processed in a separate workstation under concurrent attending physician supervision. No evidence of hemorrhagic complication noted. The catheter system was withdrawn. A right common femoral artery angiogram was obtained showing access at the level of the right common femoral artery. The femoral sheath was removed and the access was closed with a Perclose ProGlide. Immediate hemostasis achieved. 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 FINDINGS: Dissection of the mid/distal right M1/MCA. IMPRESSION: 1. Mechanical thrombectomy performed (x2) with temporary restoration of flow revealing dissection flap in the mid/distal right  M1/MCA segment. 2. Intracranial stenting placed in the right M1/MCA across the dissection flap with complete restoration of flow. 3. No hemorrhagic complication on postprocedural flat panel CT. No embolus to new territory. PLAN: Patient is to remain on Integrilin drip. A follow-up head CT will be obtained at 6:30 p.m. today. At this point, patient will be re-evaluated for transition to oral dual anti-platelet therapy. Electronically Signed   By: Pedro Earls M.D.   On: 06/04/2019 17:40   CT HEAD CODE STROKE WO  CONTRAST  Addendum Date: 06/04/2019   ADDENDUM REPORT: 06/04/2019 12:21 ADDENDUM: Contrast dose is 100 mL Omnipaque 350 Electronically Signed   By: Macy Mis M.D.   On: 06/04/2019 12:21   Result Date: 06/04/2019 CLINICAL DATA:  Code stroke.  Slurred speech and left facial droop EXAM: CT ANGIOGRAPHY HEAD AND NECK CT PERFUSION BRAIN TECHNIQUE: Multidetector CT imaging of the head and neck was performed using the standard protocol during bolus administration of intravenous contrast. Multiplanar CT image reconstructions and MIPs were obtained to evaluate the vascular anatomy. Carotid stenosis measurements (when applicable) are obtained utilizing NASCET criteria, using the distal internal carotid diameter as the denominator. Multiphase CT imaging of the brain was performed following IV bolus contrast injection. Subsequent parametric perfusion maps were calculated using RAPID software. COMPARISON:  None. FINDINGS: CT HEAD Brain: There is no acute intracranial hemorrhage mass effect edema. Gray-white differentiation is preserved. Ventricles and sulci normal in size and configuration. There is no extra-axial fluid collection. Vascular: Hyperdense vessel. Skull: Unremarkable. Sinuses/Orbits: Lobular mucosal thickening with greatest involvement of the right maxillary sinus. No acute orbital finding. Other: Mastoid air cells are clear ASPECTS [facility] Stroke Program Early CT Score) - Ganglionic level infarction (caudate, lentiform nuclei, internal capsule, insula, M1-M3 cortex): 7 - Supraganglionic infarction (M4-M6 cortex): 3 Total score (0-10 with 10 being normal): 10 Review of the MIP images confirms the above findings CTA NECK Aortic arch: Great vessel origins are patent. Right carotid system: Patent. There is trace calcified plaque at the ICA origin measurable stenosis. Left carotid system: Patent. Eccentric noncalcified plaque causing less than 50% stenosis. Mild calcified plaque at the ICA origin causing less  than 50% stenosis. Vertebral arteries: Patent.  Left vertebral artery dominant. Skeleton: Cervical spine degenerative changes. Other neck: No mass or adenopathy Upper chest: No apical lung mass. Review of the MIP images confirms the above findings CTA HEAD Anterior circulation: Intracranial internal carotid arteries patent. There is nonocclusive thrombus within distal right M1 MCA extending to the bifurcation. Left middle cerebral artery is patent. Anterior cerebral arteries are patent. Congenitally absent right A1 ACA. Posterior circulation: Intracranial vertebral arteries, basilar artery, and posterior cerebral arteries are patent. There are bilateral patent posterior communicating arteries. Venous sinuses: As permitted by contrast timing, patent. Review of the MIP images confirms the above findings CT Brain Perfusion: CBF (<30%) Volume: 55m Perfusion (Tmax>6.0s) volume: 1563mMismatch Volume: 12676mnfarction Location: Right MCA territory IMPRESSION: No acute intracranial hemorrhage or evidence of acute infarction. ASPECT score is 10. 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 Perfusion imaging demonstrates 25 mL core infarction and 126 mL territory at risk in the right MCA territory. No hemodynamically significant stenosis in the neck. These results were called by telephone at the time of interpretation on 06/04/2019 at 11:53 am to provider JONMidwest Endoscopy Services LLCLESAlexis Goodellwho verbally acknowledged these results. Electronically Signed: By: PraMacy MisD. On: 06/04/2019 11:59   VAS US [place]  EXTREMITY VENOUS (DVT)  Result Date: 06/06/2019  Lower Venous DVTStudy Indications: Stroke.  Comparison Study: No prior study on file Performing Technologist: Sharion Dove RVS  Examination Guidelines: A complete evaluation includes B-mode imaging, spectral Doppler, color Doppler, and power Doppler as needed of all accessible portions of each vessel. Bilateral testing is  considered an integral part of a complete examination. Limited examinations for reoccurring indications may be performed as noted. The reflux portion of the exam is performed with the patient in reverse Trendelenburg.  +---------+---------------+---------+-----------+----------+--------------+ RIGHT    CompressibilityPhasicitySpontaneityPropertiesThrombus Aging +---------+---------------+---------+-----------+----------+--------------+ CFV      Full           Yes      Yes                                 +---------+---------------+---------+-----------+----------+--------------+ SFJ      Full                                                        +---------+---------------+---------+-----------+----------+--------------+ FV Prox  Full                                                        +---------+---------------+---------+-----------+----------+--------------+ FV Mid   Full                                                        +---------+---------------+---------+-----------+----------+--------------+ FV DistalFull                                                        +---------+---------------+---------+-----------+----------+--------------+ PFV      Full                                                        +---------+---------------+---------+-----------+----------+--------------+ POP      Full           Yes      Yes                                 +---------+---------------+---------+-----------+----------+--------------+ PTV      Full                                                        +---------+---------------+---------+-----------+----------+--------------+ PERO     Full                                                        +---------+---------------+---------+-----------+----------+--------------+   +---------+---------------+---------+-----------+----------+--------------+  LEFT      CompressibilityPhasicitySpontaneityPropertiesThrombus Aging +---------+---------------+---------+-----------+----------+--------------+ CFV      Full           Yes      Yes                                 +---------+---------------+---------+-----------+----------+--------------+ SFJ      Full                                                        +---------+---------------+---------+-----------+----------+--------------+ FV Prox  Full                                                        +---------+---------------+---------+-----------+----------+--------------+ FV Mid   Full                                                        +---------+---------------+---------+-----------+----------+--------------+ FV DistalFull                                                        +---------+---------------+---------+-----------+----------+--------------+ PFV      Full                                                        +---------+---------------+---------+-----------+----------+--------------+ POP      Full           Yes      Yes                                 +---------+---------------+---------+-----------+----------+--------------+ PTV      Full                                                        +---------+---------------+---------+-----------+----------+--------------+ PERO     Full                                                        +---------+---------------+---------+-----------+----------+--------------+     Summary: BILATERAL: - No evidence of deep vein thrombosis seen in the lower extremities, bilaterally.   *See table(s) above for measurements and observations. Electronically signed by Curt Jews MD on 06/06/2019 at 3:51:27 PM.    Final  HISTORY OF PRESENT ILLNESS (From Dr Cecil Cobbs H&P) TYRONN GOLDA is a 59 y.o. male with a history of colon cancer in 2012 who presents with left-sided weakness that was present on  awakening this morning.  He went to bed last night.  He woke this morning with left-sided weakness, but was not aware of it.  His girlfriend told him that something was wrong and made him come to the emergency department, they arrived by private vehicle to [facility].  There, he was noted to be van positive and a CTA/P was undertaken which demonstrated large penumbra. Due to this penumbra, he was transferred to [facility] for emergent thrombectomy.  I attempted to call the only other number in the chart, but did not get an answer and therefore he was taken for emergent thrombectomy based on two-physician emergency consent(with Dr. Ladean Raya) He was previously treated with chemotherapy (I think he is lost to follow-up).  He had surgical resection in 2012 as well as adjuvant FOLFOX chemotherapy. LKW: 2 AM tpa given?: No, outside of window IR Thrombectomy?  Yes Modified Rankin Scale: 0-Completely asymptomatic and back to baseline post- stroke NIHSS: [place] [name] is a 59 y.o. male with history of colon cancer in 2012 presenting to [facility] with L sided weakness. Out of window for tPA. CTA w/ R M1 occlusion w/ penumbra. Transferred to [facility] for IR.   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 ASPECTS 10.     CTA head & neck nonocclusive distal R M1 thrombus into bifurcation. Neck ok  CT perfusion 42m core, 1276mterritory at risk  IR mid/distal R M1 occlusion. Mechanical thrombectomy w/ TICI2c revascularization. Dissection flap at site of occlusion. Cerebral angio w/ reocclusion. R M1 stent placed w/ TICI3 flow. Treated w/ Integrilin gtt.   Post IR CT no hemorrhage. pathcy cortical/subcortical R MCA infarct (frontal operculum, insula, temporoparietal jxn). Sinus dz.   Not  able to have MRI due to left ankle brace  CT head repeat 3/16 no ICH  LE Doppler no DVT  2D Echo EF 65-70%. No source of embolus   TEE completed, no PFO, EF 65-70%  Loop will be considered as outpt as pt is uninsured at this time.    Hypercoagulable labs: out pt f/u  UDS neg  LDL 126  HgbA1c 8.3  SCDs for VTE prophylaxis  No antithrombotic prior to admission, now on aspirin 325 mg daily and Brilinta (ticagrelor) 90 mg bid following Brilinta load. Continue on discharge  Therapy recommendations:  CIR  Disposition:  pending  Blood Pressure  Home meds:  None, no hx HTN  Stable now  BP goal < 180/105 for now  Long-term BP goal normotensive  Hyperlipidemia  Home meds:  No statin  LDL 126, goal < 70  Added lipitor 80  Continue statin at discharge  Diabetes, new diagnosis  2 random glucoses elevated  Home meds:  None  HgbA1c 8.3, goal < 7.0  CBG  SSI  DM coordinator consult  Need PCP follow up  Dysphagia  Secondary to stroke  SLP following  NG placed for meds -> now off  Passed MBS for modified dysphagia diet.   D/c IVF   UTI  No fever  Leukocytosis WBC 13.4  UA WBC > 50  CXR neg  On bactrim DS 1 bid x 3 days  Other Stroke Risk Factors  Former Cigarette smoker  ETOH use, alcohol level <10, advised to drink no more than 2 drink(s) a day  Obesity, Body mass index is 32.29 kg/m., recommend weight loss, diet and exercise as appropriate   Other Active Problems  Hx colon cancer 2004 s/p surgical resection and chemo, followed up until 2012, no recurrence as per daughter  Hyponatremia 133->140  Hypokalemia - supplemented 06/09/19  RN Pressure Injury Documentation:     Malnutrition Type:  Nutrition Problem: Inadequate oral intake Etiology: poor appetite   Malnutrition Characteristics:  Signs/Symptoms: meal completion < 50%   Nutrition Interventions:  Interventions: Ensure Enlive (each supplement provides  350kcal and 20 grams of protein), Education  DISCHARGE EXAM Vitals:   06/08/19 2344 06/09/19 0355 06/09/19 0751 06/09/19 0753  BP: (!) 123/103 133/66 112/68 112/68  Pulse: 91 89 78 82  Resp: 17 17 16 16   Temp: 98.5 F (36.9 C) 98.2 F (36.8 C)  98.4 F (36.9 C)  TempSrc: Oral Oral  Oral  SpO2: 96% 96% 97% 97%  Weight:      Height:       General - Well nourished, well developed, in no apparent distress.  Ophthalmologic - fundi not visualized due to noncooperation.  Cardiovascular - Regular rhythm and rate.  Neuro - awake alert, mild dysarthria, orientated to place, people, year but not to month. No aphasia, fluent language, following all simple commands. PERRL, mild right gaze preference, able to gaze to left but not complete. Still has left hemianopia vs neglect. Left facial droop. Tongue midline. RUE proximal 4/5 due to right mild shoulder pain but distal 5/5, LUE proximal and distal 5/5. BLE 5/5. Sensation symmetrical, coordination intact b/l FTN. Gait not tested.    Discharge Diet   Diet Order            DIET DYS 3 Room service appropriate? Yes; Fluid consistency: Thin  Diet effective now             liquids  DISCHARGE PLAN  Disposition:  Discharge to [place] for ongoing PT, OT and ST  aspirin 81 mg daily and Brilinta (ticagrelor) 90 mg bid for secondary stroke prevention.  Recommend ongoing risk factor control by Primary Care Physician at time of discharge from inpatient rehabilitation.  Follow-up Patient, No Pcp Per in 2 weeks following discharge from rehab.  Follow-up in [facility] in 4 weeks following discharge from rehab, office to schedule an appointment.   Follow-up Dr. Karenann Cai in clinic in 3 months and image-guided diagnostic cerebral arteriogram F/U in 6 months.  Follow-up Dr Caryl Comes for possible loop recorder (see above)  40 minutes were spent preparing discharge.  Rosalin Hawking, MD  PhD Stroke Neurology 06/09/2019 11:53 PM

## 2019-06-09 NOTE — H&P (Signed)
Physical Medicine and Rehabilitation Admission H&P       HPI: Scott Day is a 59 year old right-handed male with history of colon cancer in 2012, GERD, tobacco abuse.  Per chart review lives alone independent prior to admission 1 level home 2 steps to entry.  Presented 06/04/2018 while left-sided weakness and slurred speech.  Admission chemistries unremarkable except glucose 213, sodium 133, alcohol negative.  Cranial CT scan negative for acute changes.  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  Patient did not receive TPA.  Underwent revascularization stent placement per interventional radiology DR [place],.  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  Echocardiogram with ejection fraction of 70% without emboli.  Lower extremity Dopplers negative for DVT.  Currently maintained on aspirin as well as Brilinta for CVA prophylaxis.  Close monitoring of blood pressure initially on Cardene.  TEE showing ejection fraction of 65% without thrombus and underwent loop recorder placement.  Findings of elevated hemoglobin A1c of 8.3 and placed on sliding scale insulin.  Dysphagia #3 thin liquid diet.  Therapy evaluation completed and patient was admitted for a comprehensive rehab program. 3/20 elevated WBC to 13.4, UA + nitrite, >50 WBC, CXR showed bibasilar infiltrate vs atelectasis, no cough or fever, no dysuria   Review of Systems  Constitutional: Negative for chills and fever.  HENT: Negative for hearing loss.   Eyes: Negative for blurred vision and double vision.  Respiratory: Negative for cough and shortness of breath.   Cardiovascular: Negative for chest pain, palpitations and leg swelling.  Gastrointestinal: Positive for constipation. Negative for heartburn, nausea and vomiting.       GERD  Genitourinary: Negative for dysuria, flank pain and hematuria.  Musculoskeletal: Positive for  myalgias.  Skin: Negative for rash.  Neurological: Positive for sensory change, speech change and weakness.  All other systems reviewed and are negative.       Past Medical History:  Diagnosis Date  . Cancer [facility]) 2012    colon cancer  . Closed fracture of left distal radius    . GERD (gastroesophageal reflux disease)      Past Surgical History:  Procedure Laterality Date  . COLON SURGERY   2012    colon cancer  . IR CT HEAD LTD   06/04/2019  . IR CT HEAD LTD   06/04/2019  . IR INTRA CRAN STENT   06/04/2019  . IR PERCUTANEOUS ART THROMBECTOMY/INFUSION INTRACRANIAL INC DIAG ANGIO   06/04/2019  . IR US GUIDE VASC ACCESS RIGHT   06/04/2019  . OPEN REDUCTION INTERNAL FIXATION (ORIF) DISTAL RADIAL FRACTURE Left 09/20/2017    Procedure: OPEN REDUCTION INTERNAL FIXATION (ORIF) DISTAL RADIAL FRACTURE;  Surgeon: Leanora Cover, MD;  Location: [place];  Service: Orthopedics;  Laterality: Left;  . RADIOLOGY WITH ANESTHESIA N/A 06/04/2019    Procedure: IR WITH ANESTHESIA;  Surgeon: Radiologist, Medication, MD;  Location: [place];  Service: Radiology;  Laterality: N/A;    History reviewed. No pertinent family history. Social History:  reports that he has quit smoking. He has never used smokeless tobacco. He reports current alcohol use. He reports that he does not use drugs. Allergies: No Known Allergies Medications Prior to Admission  Medication Sig Dispense Refill  . HYDROcodone-acetaminophen (NORCO) 5-325 MG tablet 1-2 tabs po q6 hours prn pain (Patient not taking: Reported on 06/04/2019) 30 tablet 0      Drug Regimen Review Drug regimen was  reviewed and remains appropriate with no significant issues identified which   Home: Home Living Family/patient expects to be discharged to:: Private residence Living Arrangements: Alone(Simultaneous filing. User may not have seen previous data.) Available Help at Discharge: Family, Available 24 hours/day Type of Home: House Home Access:  Stairs to enter [employer] of Steps: 2 Home Layout: One level Bathroom Shower/Tub: Chiropodist: Standard Home Equipment: None   Functional History: Prior Function Level of Independence: Independent Comments: ADLs, IADLs, and works in [employer].   Functional Status:  Mobility: Bed Mobility Overal bed mobility: Needs Assistance Bed Mobility: Supine to Sit, Sit to Supine Supine to sit: Min assist, HOB elevated Sit to supine: Min guard General bed mobility comments: Min A for elevating trunk. Min guard A for safety in returning to supine Transfers Overall transfer level: Needs assistance Equipment used: 2 person hand held assist Transfers: Sit to/from Stand Sit to Stand: +2 physical assistance, +2 safety/equipment, Min assist, Mod assist General transfer comment: Min A +2 to power up into standing and then Mod A +2 for correction of left lateral lean Ambulation/Gait Ambulation/Gait assistance: Mod assist, +2 physical assistance Gait Distance (Feet): 25 Feet Assistive device: 2 person hand held assist Gait Pattern/deviations: Step-through pattern, Decreased stride length, Decreased weight shift to right, Staggering left, Drifts right/left General Gait Details: decr attn to left environment with running into wall; left lean   ADL: ADL Overall ADL's : Needs assistance/impaired Eating/Feeding: NPO Grooming: Sitting, Minimal assistance, Min guard, Wash/dry face Grooming Details (indicate cue type and reason): Pt wiping his mouth with washclothe in LUE and noting decreased coorindation, maintaining grasp, and dropping to clothe as soon as finished with poor attention to LUE Upper Body Bathing: Minimal assistance, Sitting Lower Body Bathing: Moderate assistance, Sit to/from stand, Maximal assistance, +2 for physical assistance, +2 for safety/equipment Upper Body Dressing : Minimal assistance, Sitting Upper Body Dressing Details (indicate cue type and  reason): Overshooting to right with reach RUE into sleeve. Unable to correct with VCs and requiring Min A.  Lower Body Dressing: Maximal assistance, +2 for physical assistance, +2 for safety/equipment, Sit to/from stand Lower Body Dressing Details (indicate cue type and reason): Pt able to sequence donning of socks and requring Max A for don while sitting at EOB.  Toilet Transfer: Moderate assistance, Maximal assistance, +2 for physical assistance, +2 for safety/equipment, Ambulation Functional mobility during ADLs: Moderate assistance, Maximal assistance, +2 for physical assistance, +2 for safety/equipment General ADL Comments: Pt presenting with inattention to left, poor balance, and visual deficits impacting his performance of ADLs. Highly distracted and poor awareness of deficits.    Cognition: Cognition Overall Cognitive Status: Impaired/Different from baseline Arousal/Alertness: Lethargic Orientation Level: Oriented X4 Attention: Sustained Sustained Attention: Impaired Sustained Attention Impairment: Verbal complex, Functional basic Memory: Impaired Memory Impairment: Storage deficit, Decreased recall of new information, Decreased short term memory Decreased Short Term Memory: Verbal complex, Functional basic Awareness: Impaired Awareness Impairment: Intellectual impairment, Emergent impairment, Anticipatory impairment Problem Solving: Impaired Problem Solving Impairment: Verbal complex, Functional basic Executive Function: Reasoning, Initiating, Self Correcting Reasoning: Impaired Reasoning Impairment: Verbal complex, Functional basic Initiating: Impaired Initiating Impairment: Verbal complex, Functional basic Self Correcting: Impaired Self Correcting Impairment: Verbal complex, Functional basic Cognition Arousal/Alertness: Awake/alert Behavior During Therapy: Impulsive Overall Cognitive Status: Impaired/Different from baseline Area of Impairment: Attention, Following  commands, Safety/judgement, Memory, Problem solving, Awareness Current Attention Level: Sustained Memory: Decreased recall of precautions, Decreased short-term memory Following Commands: Follows one step commands inconsistently, Follows one step commands  with increased time Safety/Judgement: Decreased awareness of safety, Decreased awareness of deficits Awareness: Intellectual Problem Solving: Slow processing, Difficulty sequencing, Requires verbal cues, Requires tactile cues General Comments: Pt able to state he is in the hospital for a "stroke, so they say." When asked what symptoms he had from the stroke he denied weakness, vision changes, or balance deficits (even at end of session after mobility required 2 person assist). Pt with poor awareness of his deficits and requiring Mod-Max cues to bring awareness of left side (body and enviroment). Pt quickly distracted and benefiting from calm and quiet environment.   Physical Exam: Blood pressure 137/72, pulse 81, temperature (!) 97.5 F (36.4 C), resp. rate 20, height _0  (1.803 m), weight 105 kg, SpO2 97 %. Physical Exam  Neurological:  Patient is alert sitting up in bed.  Mildly dysarthric speech but intelligible.  Provides place people, year but not month.  Follows simple commands.  Mild right gaze preference.   General: No acute distress Mood and affect are appropriate Heart: Regular rate and rhythm no rubs murmurs or extra sounds Lungs: Clear to auscultation, breathing unlabored, no rales or wheezes Abdomen: Positive bowel sounds, soft nontender to palpation, nondistended Extremities: No clubbing, cyanosis, or edema Skin: No evidence of breakdown, no evidence of rash Neurologic: Cranial nerves II through XII intact, motor strength is 5/5 in RIght and 4/5 left deltoid, bicep, tricep, grip, hip flexor, knee extensors, ankle dorsiflexor and plantar flexor Sensory exam mildly reduced sensation to light touch in Left  upper and lower  extremities  Musculoskeletal: Full range of motion in all 4 extremities. No joint swelling   Lab Results Last 48 Hours        Results for orders placed or performed during the hospital encounter of 06/04/19 (from the past 48 hour(s))  MRSA PCR Screening     Status: None    Collection Time: 06/04/19  3:35 PM    Specimen: Nasal Mucosa; Nasopharyngeal  Result Value Ref Range    MRSA by PCR NEGATIVE NEGATIVE      Comment:        The GeneXpert MRSA Assay (FDA approved for NASAL specimens only), is one component of a comprehensive MRSA colonization surveillance program. It is not intended to diagnose MRSA infection nor to guide or monitor treatment for MRSA infections. Performed at [facility], [place] [address] [place], [place] [address]    HIV Antibody (routine testing w rflx)     Status: None    Collection Time: 06/04/19  3:50 PM  Result Value Ref Range    HIV Screen 4th Generation wRfx NON REACTIVE NON REACTIVE      Comment: Performed at [facility], [place] [address] [place], [place] [address]  Hemoglobin A1c     Status: Abnormal    Collection Time: 06/05/19  5:43 AM  Result Value Ref Range    Hgb A1c MFr Bld 8.3 (H) 4.8 - 5.6 %      Comment: (NOTE) Pre diabetes:          5.7%-6.4% Diabetes:              >6.4% Glycemic control for   <7.0% adults with diabetes      Mean Plasma Glucose 191.51 mg/dL      Comment: Performed at [place] [address] [place], [place] [address]  Lipid panel     Status: Abnormal    Collection Time: 06/05/19  5:43 AM  Result  Value Ref Range    Cholesterol 180 0 - 200 mg/dL    Triglycerides 131 <150 mg/dL    HDL 28 (L) >40 mg/dL    Total CHOL/HDL Ratio 6.4 RATIO    VLDL 26 0 - 40 mg/dL    LDL Cholesterol 126 (H) 0 - 99 mg/dL      Comment:        Total Cholesterol/HDL:CHD Risk Coronary Heart Disease Risk Table                     Men   Women  1/2 Average Risk   3.4   3.3  Average Risk       5.0   4.4  2 X Average  Risk   9.6   7.1  3 X Average Risk  23.4   11.0        Use the calculated Patient Ratio above and the CHD Risk Table to determine the patient's CHD Risk.        ATP III CLASSIFICATION (LDL):  <100     mg/dL   Optimal  100-129  mg/dL   Near or Above                    Optimal  130-159  mg/dL   Borderline  160-189  mg/dL   High  >190     mg/dL   Very High Performed at [facility] [address], [place], [place] [address]    CBC     Status: None    Collection Time: 06/05/19  5:43 AM  Result Value Ref Range    WBC 8.0 4.0 - 10.5 K/uL    RBC 4.56 4.22 - 5.81 MIL/uL    Hemoglobin 14.6 13.0 - 17.0 g/dL    HCT 42.8 39.0 - 52.0 %    MCV 93.9 80.0 - 100.0 fL    MCH 32.0 26.0 - 34.0 pg    MCHC 34.1 30.0 - 36.0 g/dL    RDW 12.6 11.5 - 15.5 %    Platelets 212 150 - 400 K/uL    nRBC 0.0 0.0 - 0.2 %      Comment: Performed at [facility], [place] [address] [place], [place] [address]  Basic metabolic panel     Status: Abnormal    Collection Time: 06/05/19  5:43 AM  Result Value Ref Range    Sodium 140 135 - 145 mmol/L    Potassium 3.8 3.5 - 5.1 mmol/L    Chloride 108 98 - 111 mmol/L    CO2 24 22 - 32 mmol/L    Glucose, Bld 144 (H) 70 - 99 mg/dL      Comment: Glucose reference range applies only to samples taken after fasting for at least 8 hours.    BUN 5 (L) 6 - 20 mg/dL    Creatinine, Ser 0.89 0.61 - 1.24 mg/dL    Calcium 8.6 (L) 8.9 - 10.3 mg/dL    GFR calc non Af Amer >60 >60 mL/min    GFR calc Af Amer >60 >60 mL/min    Anion gap 8 5 - 15      Comment: Performed at [facility] [address] [place], [place] [address]  Glucose, capillary     Status: Abnormal    Collection Time: 06/05/19  9:51 AM  Result Value Ref Range    Glucose-Capillary 159 (H) 70 - 99 mg/dL      Comment:  Glucose reference range applies only to samples taken after fasting for at least 8 hours.  Glucose, capillary     Status: Abnormal    Collection Time: 06/05/19 11:24 AM  Result  Value Ref Range    Glucose-Capillary 129 (H) 70 - 99 mg/dL      Comment: Glucose reference range applies only to samples taken after fasting for at least 8 hours.  Glucose, capillary     Status: Abnormal    Collection Time: 06/05/19  4:16 PM  Result Value Ref Range    Glucose-Capillary 139 (H) 70 - 99 mg/dL      Comment: Glucose reference range applies only to samples taken after fasting for at least 8 hours.    Comment 1 Notify RN      Comment 2 Document in Chart    Rapid urine drug screen (hospital performed)     Status: None    Collection Time: 06/05/19  7:26 PM  Result Value Ref Range    Opiates NONE DETECTED NONE DETECTED    Cocaine NONE DETECTED NONE DETECTED    Benzodiazepines NONE DETECTED NONE DETECTED    Amphetamines NONE DETECTED NONE DETECTED    Tetrahydrocannabinol NONE DETECTED NONE DETECTED    Barbiturates NONE DETECTED NONE DETECTED      Comment: (NOTE) DRUG SCREEN FOR MEDICAL PURPOSES ONLY.  IF CONFIRMATION IS NEEDED FOR ANY PURPOSE, NOTIFY LAB WITHIN 5 DAYS. LOWEST DETECTABLE LIMITS FOR URINE DRUG SCREEN Drug Class                     Cutoff (ng/mL) Amphetamine and metabolites    1000 Barbiturate and metabolites    200 Benzodiazepine                 409 Tricyclics and metabolites     300 Opiates and metabolites        300 Cocaine and metabolites        300 THC                            50 Performed at [facility], [place] [address], [place], [place] [address]    Glucose, capillary     Status: Abnormal    Collection Time: 06/05/19  8:04 PM  Result Value Ref Range    Glucose-Capillary 104 (H) 70 - 99 mg/dL      Comment: Glucose reference range applies only to samples taken after fasting for at least 8 hours.  Glucose, capillary     Status: Abnormal    Collection Time: 06/06/19 12:05 AM  Result Value Ref Range    Glucose-Capillary 101 (H) 70 - 99 mg/dL      Comment: Glucose reference range applies only to samples taken after fasting for at least 8  hours.    Comment 1 Notify RN    Glucose, capillary     Status: Abnormal    Collection Time: 06/06/19  4:22 AM  Result Value Ref Range    Glucose-Capillary 140 (H) 70 - 99 mg/dL      Comment: Glucose reference range applies only to samples taken after fasting for at least 8 hours.    Comment 1 Notify RN    CBC     Status: None    Collection Time: 06/06/19  5:22 AM  Result Value Ref Range    WBC 7.6 4.0 - 10.5 K/uL    RBC 4.68 4.22 - 5.81 MIL/uL  Hemoglobin 14.8 13.0 - 17.0 g/dL    HCT 44.4 39.0 - 52.0 %    MCV 94.9 80.0 - 100.0 fL    MCH 31.6 26.0 - 34.0 pg    MCHC 33.3 30.0 - 36.0 g/dL    RDW 12.2 11.5 - 15.5 %    Platelets 200 150 - 400 K/uL    nRBC 0.0 0.0 - 0.2 %      Comment: Performed at [facility], [place] [address] [place], [place] [address]  Basic metabolic panel     Status: Abnormal    Collection Time: 06/06/19  5:22 AM  Result Value Ref Range    Sodium 139 135 - 145 mmol/L    Potassium 3.3 (L) 3.5 - 5.1 mmol/L    Chloride 107 98 - 111 mmol/L    CO2 21 (L) 22 - 32 mmol/L    Glucose, Bld 121 (H) 70 - 99 mg/dL      Comment: Glucose reference range applies only to samples taken after fasting for at least 8 hours.    BUN 6 6 - 20 mg/dL    Creatinine, Ser 0.86 0.61 - 1.24 mg/dL    Calcium 8.7 (L) 8.9 - 10.3 mg/dL    GFR calc non Af Amer >60 >60 mL/min    GFR calc Af Amer >60 >60 mL/min    Anion gap 11 5 - 15      Comment: Performed at [place] [address], [place], [place] [address]  Glucose, capillary     Status: Abnormal    Collection Time: 06/06/19  7:57 AM  Result Value Ref Range    Glucose-Capillary 104 (H) 70 - 99 mg/dL      Comment: Glucose reference range applies only to samples taken after fasting for at least 8 hours.    Comment 1 Repeat Test      Comment 2 Document in Chart    Glucose, capillary     Status: Abnormal    Collection Time: 06/06/19 11:42 AM  Result Value Ref Range    Glucose-Capillary 134 (H) 70 - 99 mg/dL       Comment: Glucose reference range applies only to samples taken after fasting for at least 8 hours.    Comment 1 Notify RN         Imaging Results (Last 48 hours)  DG Abd 1 View   Result Date: 06/05/2019 CLINICAL DATA:  New NG tube EXAM: ABDOMEN - 1 VIEW COMPARISON:  None. FINDINGS: Advancement of NG tube such that the tip is at the expected location of the junction of the second third portion the duodenum. IMPRESSION: NG tube extends into duodenum Electronically Signed   By: Suzy Bouchard M.D.   On: 06/05/2019 14:28    DG Abd 1 View   Result Date: 06/04/2019 CLINICAL DATA:  Nasogastric tube placement. EXAM: ABDOMEN - 1 VIEW COMPARISON:  None. FINDINGS: A nasogastric tube is seen with its distal tip overlying the body of the stomach. This is approximately 7.8 cm distal to the expected region of the gastroesophageal junction. The bowel gas pattern is normal. No radio-opaque calculi or other significant radiographic abnormality are seen. IMPRESSION: Nasogastric tube positioning, as described above. Further advancement of the NG tube by approximately 5 cm is recommended to decrease the risk of aspiration. Electronically Signed   By: Virgina Norfolk M.D.   On: 06/04/2019 20:23    CT HEAD WO CONTRAST   Result Date: 06/05/2019 CLINICAL DATA:  Follow-up stroke.  Right MCA territory infarctions. EXAM: CT HEAD WITHOUT CONTRAST TECHNIQUE: Contiguous axial images were obtained from the base of the skull through the vertex without intravenous contrast. COMPARISON:  Multiple CT studies over the last 2 days. FINDINGS: Brain: Patchy low-density in the right middle cerebral artery territory continues to become more evident by CT consistent with multiple infarctions in that region. These are notable in the insula, right lateral and posterior temporal lobe, right frontal operculum and right frontal parietal junction. No significant swelling, mass effect or any detectable hemorrhage. Right MCA stent as seen  previously. Vascular: There is atherosclerotic calcification of the major vessels at the base of the brain. Right MCA stent as seen previously. Skull: Negative Sinuses/Orbits: Chronic sinusitis particularly affecting the right maxillary sinus. Orbits negative. Other: None IMPRESSION: Multiple infarctions in the right middle cerebral artery territory becoming more evident on CT, with increasing low density. No evidence of significant mass effect or any hemorrhage. Electronically Signed   By: Nelson Chimes M.D.   On: 06/05/2019 15:38    CT HEAD WO CONTRAST   Result Date: 06/04/2019 CLINICAL DATA:  Stroke, follow-up. EXAM: CT HEAD WITHOUT CONTRAST TECHNIQUE: Contiguous axial images were obtained from the base of the skull through the vertex without intravenous contrast. COMPARISON:  Noncontrast head CT and CT angiogram head/neck performed earlier the same day 06/04/2019 FINDINGS: Brain: There has been interval development of patchy acute cortical/subcortical infarction changes within the right MCA vascular territory. Findings are most notable within the right frontal operculum, right insula and within portions of the right temporoparietal junction (series 3, image 20) (series 3, image 18) (series 5, image 48). No evidence of hemorrhagic conversion. No significant mass effect. No midline shift. There is no hydrocephalus. No extra-axial fluid collection is identified. Vascular: A stent is now present in the region of the M1 right middle cerebral artery. Otherwise, no hyperdense vessel is identified. Skull: Normal. Negative for fracture or focal lesion. Sinuses/Orbits: Visualized orbits demonstrate no acute abnormality. Moderate mucosal thickening and frothy secretions within the right maxillary sinus. Additional mild scattered paranasal sinus mucosal thickening. Left frontal sinus mucous retention cyst. No significant mastoid effusion. IMPRESSION: Interval development of patchy acute cortical/subcortical infarction  changes within the right MCA vascular territory most notable within the right frontal operculum, right insula and portions of the right temporoparietal junction. No hemorrhagic conversion. No midline shift or significant mass effect. Paranasal sinus disease as described, most notably right maxillary. Electronically Signed   By: Kellie Simmering DO   On: 06/04/2019 18:43    IR Intra Cran Stent   Result Date: 06/04/2019 CLINICAL DATA:  59 year old male with past medical history significant for colon cancer in 2012. He developed left-sided weakness and neglect on awakening this morning. He was taken to [facility] hospital. Head CT showed small hypodensity in the right insula with no hemorrhage. NIHSS 10. CT angiogram showed tapering of the right M1/MCA with short-segment filling defect. CT perfusion showed an estimated core volume of 25 mL with a mismatch of 126 mL. He was transferred to our service for emergency treatment of the right M1/MCA occlusion. EXAM: IR CT HEAD LIMITED; IR PERCUTANEOUS ART THORMBECTOMY/INFUSION INTRACRANIAL INCLUDE DIAG ANGIO; IR ULTRASOUND GUIDANCE VASC ACCESS RIGHT; INTRACRANIAL STENT (INCL PTA) ANESTHESIA/SEDATION: General anesthesia. MEDICATIONS: Intra arterial Verapamil 5 mg; intra arterial Integrilin 19 mg. CONTRAST:  28m OMNIPAQUE IOHEXOL 300 MG/ML SOLN, 748mOMNIPAQUE IOHEXOL 240 MG/ML SOLN PROCEDURE: Operator: Dr. KaPedro EarlsMD. No healthcare proxy or next  of kin available for informed consent. Discussion held with the neurohospitalist attending. Agreement reached that the procedure would be in the best interest of the patient and benefits outweighed risks. The patient was made to lie supine on the angiography table. He was prepped and draped utilizing the usual sterile technique. Using gray scale ultrasound-guided, a micropuncture kit and modified Seldinger technique, access was gained to the right common femoral artery. An 8 French femoral sheath was then  placed. Under fluoroscopy, an 8 [place] Walrus balloon guide catheter was navigated over a 6 [place] Berenstein 2 catheter and a 0.038 inch Terumo Glidewire into the aortic arch. Under fluoroscopy, the catheter was advanced into the right common carotid artery and then into the right internal carotid artery. Frontal and lateral angiograms of the head were obtained showing a distal right M1/MCA occlusion with minimal delayed penetration of contrast. A large bore aspiration catheter was then navigated through the walrus balloon guide catheter and over a phenom 21 microcatheter and a synchro support microguidewire into the cavernous segment of the right ICA. The microcatheter was then advanced over the microwire into a right M1/MCA middle division branch. A 6 mm solitaire stent retriever was deployed spanning the right M1/MCA and proximal M2/middle division branch. Device was allowed to intercalated with the clot for 4 minutes. The microcatheter was removed under fluoroscopy. The guiding catheter balloon was inflated at the level of the proximal petrous segment. Thrombectomy device and aspiration catheter were then removed under constant aspiration. Follow-up angiogram showed recanalization of the M1 segment with subocclusive filling defect and slow flow in distal MCA branches (TICI 2C). A large bore aspiration catheter was again navigated through the walrus balloon guide catheter and over a phenom 21 microcatheter and a synchro support microguidewire into the cavernous segment of the right ICA. The microcatheter was then advanced over the microwire into a right M1/MCA posterior division branch. A 6 mm solitaire stent retriever was deployed spanning the right M1/MCA and proximal M2 posterior division branch. Device was allowed to intercalated with the clot for 4 minutes. The microcatheter was removed under fluoroscopy. The guiding catheter balloon was inflated at the level of the proximal petrous segment. Thrombectomy  device and aspiration catheter were then removed under constant aspiration. Follow-up angiogram show normal forward flow into the right MCA vascular territory noting a small dissection flap in the mid/distal right M1/MCA segment. Delayed follow-up angiogram showed progressive reocclusion of the right M1/MCA with minimal forward flow. Flat panel CT of the head was obtained and post processed in a separate workstation under concurrent attending physician supervision. No evidence of hemorrhagic complication noted. Right ICA angiograms with frontal lateral views of the head were obtained. Further progression of occlusion in the right M1 segment was noted. Small non flow limiting iatrogenic dissection in the upper cervical segment of the right ICA identified. The large bore aspiration catheter was navigated through the walrus balloon guide catheter and over a SL 10 microcatheter and a synchro support microguidewire into the cavernous segment of the right ICA. The microcatheter was then advanced over the microwire into a right M1/MCA posterior division branch. Intra arterial infusion of 19 mg of Integrilin was performed into the right ICA. Subsequently, a 3 mm x 15 mm atlas intracranial stent was deployed in the M1 segment across the dissection flap. Follow-up angiogram showed adequate stent positioning with complete restoration of the anterograde flow in the right MCA. Delayed follow-up angiogram (x2) show no evidence of reocclusion. Small non flow-limiting dissection of  the upper cervical segment of the right ICA remained stable. Postprocedural flat panel CT of the head was obtained and post processed in a separate workstation under concurrent attending physician supervision. No evidence of hemorrhagic complication noted. The catheter system was withdrawn. A right common femoral artery angiogram was obtained showing access at the level of the right common femoral artery. The femoral sheath was removed and the access was  closed with a Perclose ProGlide. Immediate hemostasis achieved. 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 FINDINGS: Dissection of the mid/distal right M1/MCA. IMPRESSION: 1. Mechanical thrombectomy performed (x2) with temporary restoration of flow revealing dissection flap in the mid/distal right M1/MCA segment. 2. Intracranial stenting placed in the right M1/MCA across the dissection flap with complete restoration of flow. 3. No hemorrhagic complication on postprocedural flat panel CT. No embolus to new territory. PLAN: Patient is to remain on Integrilin drip. A follow-up head CT will be obtained at 6:30 p.m. today. At this point, patient will be re-evaluated for transition to oral dual anti-platelet therapy. Electronically Signed   By: Pedro Earls M.D.   On: 06/04/2019 17:40    IR CT Head Ltd   Result Date: 06/04/2019 CLINICAL DATA:  59 year old male with past medical history significant for colon cancer in 2012. He developed left-sided weakness and neglect on awakening this morning. He was taken to [facility] hospital. Head CT showed small hypodensity in the right insula with no hemorrhage. NIHSS 10. CT angiogram showed tapering of the right M1/MCA with short-segment filling defect. CT perfusion showed an estimated core volume of 25 mL with a mismatch of 126 mL. He was transferred to our service for emergency treatment of the right M1/MCA occlusion. EXAM: IR CT HEAD LIMITED; IR PERCUTANEOUS ART THORMBECTOMY/INFUSION INTRACRANIAL INCLUDE DIAG ANGIO; IR ULTRASOUND GUIDANCE VASC ACCESS RIGHT; INTRACRANIAL STENT (INCL PTA) ANESTHESIA/SEDATION: General anesthesia. MEDICATIONS: Intra arterial Verapamil 5 mg; intra arterial Integrilin 19 mg. CONTRAST:  64m OMNIPAQUE IOHEXOL 300 MG/ML SOLN, 772mOMNIPAQUE IOHEXOL 240 MG/ML SOLN PROCEDURE: Operator: Dr. KaPedro EarlsMD. No healthcare proxy or next of kin available for  informed consent. Discussion held with the neurohospitalist attending. Agreement reached that the procedure would be in the best interest of the patient and benefits outweighed risks. The patient was made to lie supine on the angiography table. He was prepped and draped utilizing the usual sterile technique. Using gray scale ultrasound-guided, a micropuncture kit and modified Seldinger technique, access was gained to the right common femoral artery. An 8 French femoral sheath was then placed. Under fluoroscopy, an 8 Fr[place]rus balloon guide catheter was navigated over a 6 FrPakistanerenstein 2 catheter and a 0.038 inch Terumo Glidewire into the aortic arch. Under fluoroscopy, the catheter was advanced into the right common carotid artery and then into the right internal carotid artery. Frontal and lateral angiograms of the head were obtained showing a distal right M1/MCA occlusion with minimal delayed penetration of contrast. A large bore aspiration catheter was then navigated through the walrus balloon guide catheter and over a phenom 21 microcatheter and a synchro support microguidewire into the cavernous segment of the right ICA. The microcatheter was then advanced over the microwire into a right M1/MCA middle division branch. A 6 mm solitaire stent retriever was deployed spanning the right M1/MCA and proximal M2/middle division branch. Device was allowed to intercalated with the clot for 4 minutes. The microcatheter was removed under fluoroscopy. The guiding catheter balloon  was inflated at the level of the proximal petrous segment. Thrombectomy device and aspiration catheter were then removed under constant aspiration. Follow-up angiogram showed recanalization of the M1 segment with subocclusive filling defect and slow flow in distal MCA branches (TICI 2C). A large bore aspiration catheter was again navigated through the walrus balloon guide catheter and over a phenom 21 microcatheter and a synchro support  microguidewire into the cavernous segment of the right ICA. The microcatheter was then advanced over the microwire into a right M1/MCA posterior division branch. A 6 mm solitaire stent retriever was deployed spanning the right M1/MCA and proximal M2 posterior division branch. Device was allowed to intercalated with the clot for 4 minutes. The microcatheter was removed under fluoroscopy. The guiding catheter balloon was inflated at the level of the proximal petrous segment. Thrombectomy device and aspiration catheter were then removed under constant aspiration. Follow-up angiogram show normal forward flow into the right MCA vascular territory noting a small dissection flap in the mid/distal right M1/MCA segment. Delayed follow-up angiogram showed progressive reocclusion of the right M1/MCA with minimal forward flow. Flat panel CT of the head was obtained and post processed in a separate workstation under concurrent attending physician supervision. No evidence of hemorrhagic complication noted. Right ICA angiograms with frontal lateral views of the head were obtained. Further progression of occlusion in the right M1 segment was noted. Small non flow limiting iatrogenic dissection in the upper cervical segment of the right ICA identified. The large bore aspiration catheter was navigated through the walrus balloon guide catheter and over a SL 10 microcatheter and a synchro support microguidewire into the cavernous segment of the right ICA. The microcatheter was then advanced over the microwire into a right M1/MCA posterior division branch. Intra arterial infusion of 19 mg of Integrilin was performed into the right ICA. Subsequently, a 3 mm x 15 mm atlas intracranial stent was deployed in the M1 segment across the dissection flap. Follow-up angiogram showed adequate stent positioning with complete restoration of the anterograde flow in the right MCA. Delayed follow-up angiogram (x2) show no evidence of reocclusion. Small  non flow-limiting dissection of the upper cervical segment of the right ICA remained stable. Postprocedural flat panel CT of the head was obtained and post processed in a separate workstation under concurrent attending physician supervision. No evidence of hemorrhagic complication noted. The catheter system was withdrawn. A right common femoral artery angiogram was obtained showing access at the level of the right common femoral artery. The femoral sheath was removed and the access was closed with a Perclose ProGlide. Immediate hemostasis achieved. 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 FINDINGS: Dissection of the mid/distal right M1/MCA. IMPRESSION: 1. Mechanical thrombectomy performed (x2) with temporary restoration of flow revealing dissection flap in the mid/distal right M1/MCA segment. 2. Intracranial stenting placed in the right M1/MCA across the dissection flap with complete restoration of flow. 3. No hemorrhagic complication on postprocedural flat panel CT. No embolus to new territory. PLAN: Patient is to remain on Integrilin drip. A follow-up head CT will be obtained at 6:30 p.m. today. At this point, patient will be re-evaluated for transition to oral dual anti-platelet therapy. Electronically Signed   By: Pedro Earls M.D.   On: 06/04/2019 17:40    IR CT Head Ltd   Result Date: 06/04/2019 CLINICAL DATA:  59 year old male with past medical history significant for colon cancer in 2012. He developed left-sided weakness and neglect on  awakening this morning. He was taken to [facility] hospital. Head CT showed small hypodensity in the right insula with no hemorrhage. NIHSS 10. CT angiogram showed tapering of the right M1/MCA with short-segment filling defect. CT perfusion showed an estimated core volume of 25 mL with a mismatch of 126 mL. He was transferred to our service for emergency treatment of the right M1/MCA  occlusion. EXAM: IR CT HEAD LIMITED; IR PERCUTANEOUS ART THORMBECTOMY/INFUSION INTRACRANIAL INCLUDE DIAG ANGIO; IR ULTRASOUND GUIDANCE VASC ACCESS RIGHT; INTRACRANIAL STENT (INCL PTA) ANESTHESIA/SEDATION: General anesthesia. MEDICATIONS: Intra arterial Verapamil 5 mg; intra arterial Integrilin 19 mg. CONTRAST:  30m OMNIPAQUE IOHEXOL 300 MG/ML SOLN, 771mOMNIPAQUE IOHEXOL 240 MG/ML SOLN PROCEDURE: Operator: Dr. KaPedro EarlsMD. No healthcare proxy or next of kin available for informed consent. Discussion held with the neurohospitalist attending. Agreement reached that the procedure would be in the best interest of the patient and benefits outweighed risks. The patient was made to lie supine on the angiography table. He was prepped and draped utilizing the usual sterile technique. Using gray scale ultrasound-guided, a micropuncture kit and modified Seldinger technique, access was gained to the right common femoral artery. An 8 French femoral sheath was then placed. Under fluoroscopy, an 8 Fr[place]rus balloon guide catheter was navigated over a 6 FrPakistanerenstein 2 catheter and a 0.038 inch Terumo Glidewire into the aortic arch. Under fluoroscopy, the catheter was advanced into the right common carotid artery and then into the right internal carotid artery. Frontal and lateral angiograms of the head were obtained showing a distal right M1/MCA occlusion with minimal delayed penetration of contrast. A large bore aspiration catheter was then navigated through the walrus balloon guide catheter and over a phenom 21 microcatheter and a synchro support microguidewire into the cavernous segment of the right ICA. The microcatheter was then advanced over the microwire into a right M1/MCA middle division branch. A 6 mm solitaire stent retriever was deployed spanning the right M1/MCA and proximal M2/middle division branch. Device was allowed to intercalated with the clot for 4 minutes. The microcatheter was  removed under fluoroscopy. The guiding catheter balloon was inflated at the level of the proximal petrous segment. Thrombectomy device and aspiration catheter were then removed under constant aspiration. Follow-up angiogram showed recanalization of the M1 segment with subocclusive filling defect and slow flow in distal MCA branches (TICI 2C). A large bore aspiration catheter was again navigated through the walrus balloon guide catheter and over a phenom 21 microcatheter and a synchro support microguidewire into the cavernous segment of the right ICA. The microcatheter was then advanced over the microwire into a right M1/MCA posterior division branch. A 6 mm solitaire stent retriever was deployed spanning the right M1/MCA and proximal M2 posterior division branch. Device was allowed to intercalated with the clot for 4 minutes. The microcatheter was removed under fluoroscopy. The guiding catheter balloon was inflated at the level of the proximal petrous segment. Thrombectomy device and aspiration catheter were then removed under constant aspiration. Follow-up angiogram show normal forward flow into the right MCA vascular territory noting a small dissection flap in the mid/distal right M1/MCA segment. Delayed follow-up angiogram showed progressive reocclusion of the right M1/MCA with minimal forward flow. Flat panel CT of the head was obtained and post processed in a separate workstation under concurrent attending physician supervision. No evidence of hemorrhagic complication noted. Right ICA angiograms with frontal lateral views of the head were obtained. Further progression of occlusion in the right M1 segment  was noted. Small non flow limiting iatrogenic dissection in the upper cervical segment of the right ICA identified. The large bore aspiration catheter was navigated through the walrus balloon guide catheter and over a SL 10 microcatheter and a synchro support microguidewire into the cavernous segment of the  right ICA. The microcatheter was then advanced over the microwire into a right M1/MCA posterior division branch. Intra arterial infusion of 19 mg of Integrilin was performed into the right ICA. Subsequently, a 3 mm x 15 mm atlas intracranial stent was deployed in the M1 segment across the dissection flap. Follow-up angiogram showed adequate stent positioning with complete restoration of the anterograde flow in the right MCA. Delayed follow-up angiogram (x2) show no evidence of reocclusion. 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 Postprocedural flat panel CT of the head was obtained and post processed in a separate workstation under concurrent attending physician supervision. No evidence of hemorrhagic complication noted. The catheter system was withdrawn. A right common femoral artery angiogram was obtained showing access at the level of the right common femoral artery. The femoral sheath was removed and the access was closed with a Perclose ProGlide. Immediate hemostasis achieved. 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 FINDINGS: Dissection of the mid/distal right M1/MCA. IMPRESSION: 1. Mechanical thrombectomy performed (x2) with temporary restoration of flow revealing dissection flap in the mid/distal right M1/MCA segment. 2. Intracranial stenting placed in the right M1/MCA across the dissection flap with complete restoration of flow. 3. No hemorrhagic complication on postprocedural flat panel CT. No embolus to new territory. PLAN: Patient is to remain on Integrilin drip. A follow-up head CT will be obtained at 6:30 p.m. today. At this point, patient will be re-evaluated for transition to oral dual anti-platelet therapy. Electronically Signed   By: Pedro Earls M.D.   On: 06/04/2019 17:40    IR US Guide Vasc Access Right   Result Date: 06/04/2019 CLINICAL DATA:  59 year old male  with past medical history significant for colon cancer in 2012. He developed left-sided weakness and neglect on awakening this morning. He was taken to [facility] hospital. Head CT showed small hypodensity in the right insula with no hemorrhage. NIHSS 10. CT angiogram showed tapering of the right M1/MCA with short-segment filling defect. CT perfusion showed an estimated core volume of 25 mL with a mismatch of 126 mL. He was transferred to our service for emergency treatment of the right M1/MCA occlusion. EXAM: IR CT HEAD LIMITED; IR PERCUTANEOUS ART THORMBECTOMY/INFUSION INTRACRANIAL INCLUDE DIAG ANGIO; IR ULTRASOUND GUIDANCE VASC ACCESS RIGHT; INTRACRANIAL STENT (INCL PTA) ANESTHESIA/SEDATION: General anesthesia. MEDICATIONS: Intra arterial Verapamil 5 mg; intra arterial Integrilin 19 mg. CONTRAST:  69m OMNIPAQUE IOHEXOL 300 MG/ML SOLN, 799mOMNIPAQUE IOHEXOL 240 MG/ML SOLN PROCEDURE: Operator: Dr. KaPedro EarlsMD. No healthcare proxy or next of kin available for informed consent. Discussion held with the neurohospitalist attending. Agreement reached that the procedure would be in the best interest of the patient and benefits outweighed risks. The patient was made to lie supine on the angiography table. He was prepped and draped utilizing the usual sterile technique. Using gray scale ultrasound-guided, a micropuncture kit and modified Seldinger technique, access was gained to the right common femoral artery. An 8 French femoral sheath was then placed. Under fluoroscopy, an 8 Fr[place]alrus balloon guide catheter was navigated over a 6 FrPakistanerenstein 2 catheter and a 0.038 inch Terumo  Glidewire into the aortic arch. Under fluoroscopy, the catheter was advanced into the right common carotid artery and then into the right internal carotid artery. Frontal and lateral angiograms of the head were obtained showing a distal right M1/MCA occlusion with minimal delayed penetration of contrast. A  large bore aspiration catheter was then navigated through the walrus balloon guide catheter and over a phenom 21 microcatheter and a synchro support microguidewire into the cavernous segment of the right ICA. The microcatheter was then advanced over the microwire into a right M1/MCA middle division branch. A 6 mm solitaire stent retriever was deployed spanning the right M1/MCA and proximal M2/middle division branch. Device was allowed to intercalated with the clot for 4 minutes. The microcatheter was removed under fluoroscopy. The guiding catheter balloon was inflated at the level of the proximal petrous segment. Thrombectomy device and aspiration catheter were then removed under constant aspiration. Follow-up angiogram showed recanalization of the M1 segment with subocclusive filling defect and slow flow in distal MCA branches (TICI 2C). A large bore aspiration catheter was again navigated through the walrus balloon guide catheter and over a phenom 21 microcatheter and a synchro support microguidewire into the cavernous segment of the right ICA. The microcatheter was then advanced over the microwire into a right M1/MCA posterior division branch. A 6 mm solitaire stent retriever was deployed spanning the right M1/MCA and proximal M2 posterior division branch. Device was allowed to intercalated with the clot for 4 minutes. The microcatheter was removed under fluoroscopy. The guiding catheter balloon was inflated at the level of the proximal petrous segment. Thrombectomy device and aspiration catheter were then removed under constant aspiration. Follow-up angiogram show normal forward flow into the right MCA vascular territory noting a small dissection flap in the mid/distal right M1/MCA segment. Delayed follow-up angiogram showed progressive reocclusion of the right M1/MCA with minimal forward flow. Flat panel CT of the head was obtained and post processed in a separate workstation under concurrent attending  physician supervision. No evidence of hemorrhagic complication noted. Right ICA angiograms with frontal lateral views of the head were obtained. Further progression of occlusion in the right M1 segment was noted. Small non flow limiting iatrogenic dissection in the upper cervical segment of the right ICA identified. The large bore aspiration catheter was navigated through the walrus balloon guide catheter and over a SL 10 microcatheter and a synchro support microguidewire into the cavernous segment of the right ICA. The microcatheter was then advanced over the microwire into a right M1/MCA posterior division branch. Intra arterial infusion of 19 mg of Integrilin was performed into the right ICA. Subsequently, a 3 mm x 15 mm atlas intracranial stent was deployed in the M1 segment across the dissection flap. Follow-up angiogram showed adequate stent positioning with complete restoration of the anterograde flow in the right MCA. Delayed follow-up angiogram (x2) show no evidence of reocclusion. 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 Postprocedural flat panel CT of the head was obtained and post processed in a separate workstation under concurrent attending physician supervision. No evidence of hemorrhagic complication noted. The catheter system was withdrawn. A right common femoral artery angiogram was obtained showing access at the level of the right common femoral artery. The femoral sheath was removed and the access was closed with a Perclose ProGlide. Immediate hemostasis achieved. 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 FINDINGS: Dissection of the mid/distal right M1/MCA. IMPRESSION: 1.  Mechanical thrombectomy performed (x2) with temporary restoration of flow revealing dissection flap in the mid/distal right M1/MCA segment. 2. Intracranial stenting placed in the right M1/MCA across the dissection  flap with complete restoration of flow. 3. No hemorrhagic complication on postprocedural flat panel CT. No embolus to new territory. PLAN: Patient is to remain on Integrilin drip. A follow-up head CT will be obtained at 6:30 p.m. today. At this point, patient will be re-evaluated for transition to oral dual anti-platelet therapy. Electronically Signed   By: Pedro Earls M.D.   On: 06/04/2019 17:40    DG CHEST PORT 1 VIEW   Result Date: 06/05/2019 CLINICAL DATA:  NG tube placement. EXAM: PORTABLE CHEST 1 VIEW COMPARISON:  CT angiogram of the chest dated 09/13/2002 FINDINGS: NG tube tip is below the diaphragm. Heart size and pulmonary vascularity are normal. Lungs are clear. No significant bone abnormality. IMPRESSION: Normal chest.  NG tube tip below the diaphragm. Electronically Signed   By: Lorriane Shire M.D.   On: 06/05/2019 14:29    DG Abd Portable 1V   Result Date: 06/05/2019 CLINICAL DATA:  Nasogastric tube placement. EXAM: PORTABLE ABDOMEN - 1 VIEW COMPARISON:  Same day. FINDINGS: The bowel gas pattern is normal. Distal tip of nasogastric tube is seen in expected position of the gastroesophageal junction. No radio-opaque calculi or other significant radiographic abnormality are seen. IMPRESSION: Distal tip of nasogastric tube seen in expected position of gastroesophageal junction. Proximal side hole is seen in distal esophagus. Advancement is recommended. Electronically Signed   By: Marijo Conception M.D.   On: 06/05/2019 12:01    DG Abd Portable 1V   Result Date: 06/05/2019 CLINICAL DATA:  Nasogastric tube placement EXAM: PORTABLE ABDOMEN - 1 VIEW COMPARISON:  Portable exam 1050 hours compared to 0858 hours FINDINGS: Tip of nasogastric tube projects over stomach though the proximal side-port projects over the distal esophagus; recommend advancing tube 5 cm. Nonobstructive bowel gas pattern. No bowel dilatation or bowel wall thickening. Osseous structures unremarkable. IMPRESSION:  Proximal side-port of nasogastric tube projects over distal esophagus; recommend advancing tube 5 cm. Electronically Signed   By: Lavonia Dana M.D.   On: 06/05/2019 11:00    DG Abd Portable 1V   Result Date: 06/05/2019 CLINICAL DATA:  NG tube placement EXAM: PORTABLE ABDOMEN - 1 VIEW COMPARISON:  06/04/2019 FINDINGS: Esophagogastric tube remains with tip below the diaphragm and side port above the gastroesophageal junction. General paucity of bowel although scattered gas is gas present to the rectum. No free air on supine radiographs. IMPRESSION: Esophagogastric tube remains with tip below the diaphragm and side port above the gastroesophageal junction. Recommend advancement to ensure subdiaphragmatic positioning of tip and side port. Electronically Signed   By: Eddie Candle M.D.   On: 06/05/2019 09:25    ECHOCARDIOGRAM COMPLETE   Result Date: 06/05/2019    ECHOCARDIOGRAM REPORT   Patient Name:   Scott Day Date of Exam: 06/05/2019 Medical Rec #:  [id number]      Height:       71.0 in Accession #:    [id number]     Weight:       231.5 lb Date of Birth:  Feb 21, 1961      BSA:          2.244 m Patient Age:    64 years       BP:           126/67 mmHg Patient Gender: M  HR:           70 bpm. Exam Location:  Inpatient Procedure: 2D Echo, Cardiac Doppler and Color Doppler Indications:    Stroke 434.91/I163.9  History:        Patient has no prior history of Echocardiogram examinations.                 Risk Factors:Former Smoker. CVA.  Sonographer:    Clayton Lefort RDCS (AE) Referring Phys: [phone] MCNEILL P [place]  1. Left ventricular ejection fraction, by estimation, is 65 to 70%. The left ventricle has normal function. The left ventricle has no regional wall motion abnormalities. There is severe left ventricular hypertrophy. Left ventricular diastolic parameters  were normal.  2. Right ventricular systolic function is normal. The right ventricular size is normal.  3. The mitral valve is  abnormal. Trivial mitral valve regurgitation.  4. The aortic valve is tricuspid. Aortic valve regurgitation is not visualized. No aortic stenosis is present.  5. The inferior vena cava is normal in size with <50% respiratory variability, suggesting right atrial pressure of 8 mmHg. FINDINGS  Left Ventricle: Left ventricular ejection fraction, by estimation, is 65 to 70%. The left ventricle has normal function. The left ventricle has no regional wall motion abnormalities. The left ventricular internal cavity size was normal in size. There is  severe left ventricular hypertrophy. Left ventricular diastolic parameters were normal. Right Ventricle: The right ventricular size is normal. No increase in right ventricular wall thickness. Right ventricular systolic function is normal. Left Atrium: Left atrial size was normal in size. Right Atrium: Right atrial size was normal in size. Pericardium: There is no evidence of pericardial effusion. Mitral Valve: The mitral valve is abnormal. There is mild thickening of the mitral valve leaflet(s). Trivial mitral valve regurgitation. MV peak gradient, 3.6 mmHg. The mean mitral valve gradient is 1.0 mmHg. Tricuspid Valve: The tricuspid valve is grossly normal. Tricuspid valve regurgitation is trivial. Aortic Valve: The aortic valve is tricuspid. Aortic valve regurgitation is not visualized. No aortic stenosis is present. Pulmonic Valve: The pulmonic valve was grossly normal. Pulmonic valve regurgitation is not visualized. Aorta: The aortic root, ascending aorta, aortic arch and descending aorta are all structurally normal, with no evidence of dilitation or obstruction. Venous: The inferior vena cava is normal in size with less than 50% respiratory variability, suggesting right atrial pressure of 8 mmHg. IAS/Shunts: The interatrial septum was not well visualized.  LEFT VENTRICLE PLAX 2D LVIDd:         4.02 cm  Diastology LVIDs:         2.40 cm  LV e' lateral:   10.40 cm/s LV PW:          1.68 cm  LV E/e' lateral: 9.0 LV IVS:        1.71 cm  LV e' medial:    9.79 cm/s LVOT diam:     2.10 cm  LV E/e' medial:  9.6 LV SV:         80 LV SV Index:   36 LVOT Area:     3.46 cm  RIGHT VENTRICLE             IVC RV Basal diam:  2.29 cm     IVC diam: 1.86 cm RV S prime:     14.10 cm/s TAPSE (M-mode): 2.7 cm LEFT ATRIUM             Index       RIGHT ATRIUM  Index LA diam:        2.70 cm 1.20 cm/m  RA Area:     11.90 cm LA Vol (A2C):   52.4 ml 23.35 ml/m RA Volume:   23.60 ml  10.52 ml/m LA Vol (A4C):   40.7 ml 18.14 ml/m LA Biplane Vol: 47.6 ml 21.21 ml/m  AORTIC VALVE AV Area (Vmean):   3.20 cm AV Area (VTI):     3.24 cm AV Vmean:          88.200 cm/s AV VTI:            0.246 m LVOT Vmax:         118.00 cm/s LVOT Vmean:        81.500 cm/s LVOT VTI:          0.230 m LVOT/AV VTI ratio: 0.93  AORTA Ao Root diam: 3.40 cm Ao Asc diam:  3.60 cm MITRAL VALVE MV Area (PHT): 3.27 cm    SHUNTS MV Peak grad:  3.6 mmHg    Systemic VTI:  0.23 m MV Mean grad:  1.0 mmHg    Systemic Diam: 2.10 cm MV Vmax:       0.94 m/s MV Vmean:      56.1 cm/s MV Decel Time: 232 msec MV E velocity: 94.10 cm/s MV A velocity: 80.00 cm/s MV E/A ratio:  1.18 Lyman Bishop MD Electronically signed by Lyman Bishop MD Signature Date/Time: 06/05/2019/11:23:20 AM    Final     IR PERCUTANEOUS ART THROMBECTOMY/INFUSION INTRACRANIAL INC DIAG ANGIO   Result Date: 06/04/2019 CLINICAL DATA:  59 year old male with past medical history significant for colon cancer in 2012. He developed left-sided weakness and neglect on awakening this morning. He was taken to [facility] hospital. Head CT showed small hypodensity in the right insula with no hemorrhage. NIHSS 10. CT angiogram showed tapering of the right M1/MCA with short-segment filling defect. CT perfusion showed an estimated core volume of 25 mL with a mismatch of 126 mL. He was transferred to our service for emergency treatment of the right M1/MCA occlusion. EXAM: IR CT HEAD  LIMITED; IR PERCUTANEOUS ART THORMBECTOMY/INFUSION INTRACRANIAL INCLUDE DIAG ANGIO; IR ULTRASOUND GUIDANCE VASC ACCESS RIGHT; INTRACRANIAL STENT (INCL PTA) ANESTHESIA/SEDATION: General anesthesia. MEDICATIONS: Intra arterial Verapamil 5 mg; intra arterial Integrilin 19 mg. CONTRAST:  71m OMNIPAQUE IOHEXOL 300 MG/ML SOLN, 79mOMNIPAQUE IOHEXOL 240 MG/ML SOLN PROCEDURE: Operator: Dr. KaPedro EarlsMD. No healthcare proxy or next of kin available for informed consent. Discussion held with the neurohospitalist attending. Agreement reached that the procedure would be in the best interest of the patient and benefits outweighed risks. The patient was made to lie supine on the angiography table. He was prepped and draped utilizing the usual sterile technique. Using gray scale ultrasound-guided, a micropuncture kit and modified Seldinger technique, access was gained to the right common femoral artery. An 8 French femoral sheath was then placed. Under fluoroscopy, an 8 Fr[place]rus balloon guide catheter was navigated over a 6 FrPakistanerenstein 2 catheter and a 0.038 inch Terumo Glidewire into the aortic arch. Under fluoroscopy, the catheter was advanced into the right common carotid artery and then into the right internal carotid artery. Frontal and lateral angiograms of the head were obtained showing a distal right M1/MCA occlusion with minimal delayed penetration of contrast. A large bore aspiration catheter was then navigated through the walrus balloon guide catheter and over a phenom 21 microcatheter and a synchro support microguidewire into the cavernous segment of the  right ICA. The microcatheter was then advanced over the microwire into a right M1/MCA middle division branch. A 6 mm solitaire stent retriever was deployed spanning the right M1/MCA and proximal M2/middle division branch. Device was allowed to intercalated with the clot for 4 minutes. The microcatheter was removed under fluoroscopy. The  guiding catheter balloon was inflated at the level of the proximal petrous segment. Thrombectomy device and aspiration catheter were then removed under constant aspiration. Follow-up angiogram showed recanalization of the M1 segment with subocclusive filling defect and slow flow in distal MCA branches (TICI 2C). A large bore aspiration catheter was again navigated through the walrus balloon guide catheter and over a phenom 21 microcatheter and a synchro support microguidewire into the cavernous segment of the right ICA. The microcatheter was then advanced over the microwire into a right M1/MCA posterior division branch. A 6 mm solitaire stent retriever was deployed spanning the right M1/MCA and proximal M2 posterior division branch. Device was allowed to intercalated with the clot for 4 minutes. The microcatheter was removed under fluoroscopy. The guiding catheter balloon was inflated at the level of the proximal petrous segment. Thrombectomy device and aspiration catheter were then removed under constant aspiration. Follow-up angiogram show normal forward flow into the right MCA vascular territory noting a small dissection flap in the mid/distal right M1/MCA segment. Delayed follow-up angiogram showed progressive reocclusion of the right M1/MCA with minimal forward flow. Flat panel CT of the head was obtained and post processed in a separate workstation under concurrent attending physician supervision. No evidence of hemorrhagic complication noted. Right ICA angiograms with frontal lateral views of the head were obtained. Further progression of occlusion in the right M1 segment was noted. Small non flow limiting iatrogenic dissection in the upper cervical segment of the right ICA identified. The large bore aspiration catheter was navigated through the walrus balloon guide catheter and over a SL 10 microcatheter and a synchro support microguidewire into the cavernous segment of the right ICA. The microcatheter was  then advanced over the microwire into a right M1/MCA posterior division branch. Intra arterial infusion of 19 mg of Integrilin was performed into the right ICA. Subsequently, a 3 mm x 15 mm atlas intracranial stent was deployed in the M1 segment across the dissection flap. Follow-up angiogram showed adequate stent positioning with complete restoration of the anterograde flow in the right MCA. Delayed follow-up angiogram (x2) show no evidence of reocclusion. 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 Postprocedural flat panel CT of the head was obtained and post processed in a separate workstation under concurrent attending physician supervision. No evidence of hemorrhagic complication noted. The catheter system was withdrawn. A right common femoral artery angiogram was obtained showing access at the level of the right common femoral artery. The femoral sheath was removed and the access was closed with a Perclose ProGlide. Immediate hemostasis achieved. 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 FINDINGS: Dissection of the mid/distal right M1/MCA. IMPRESSION: 1. Mechanical thrombectomy performed (x2) with temporary restoration of flow revealing dissection flap in the mid/distal right M1/MCA segment. 2. Intracranial stenting placed in the right M1/MCA across the dissection flap with complete restoration of flow. 3. No hemorrhagic complication on postprocedural flat panel CT. No embolus to new territory. PLAN: Patient is to remain on Integrilin drip. A follow-up head CT will be obtained at 6:30 p.m. today. At this point, patient will be re-evaluated for transition  to oral dual anti-platelet therapy. Electronically Signed   By: Pedro Earls M.D.   On: 06/04/2019 17:40    VAS [place] LOWER EXTREMITY VENOUS (DVT)   Result Date: 06/06/2019  Lower Venous DVTStudy Indications: Stroke.  Comparison  Study: No prior study on file Performing Technologist: Sharion Dove RVS  Examination Guidelines: A complete evaluation includes B-mode imaging, spectral Doppler, color Doppler, and power Doppler as needed of all accessible portions of each vessel. Bilateral testing is considered an integral part of a complete examination. Limited examinations for reoccurring indications may be performed as noted. The reflux portion of the exam is performed with the patient in reverse Trendelenburg.  +---------+---------------+---------+-----------+----------+--------------+ RIGHT    CompressibilityPhasicitySpontaneityPropertiesThrombus Aging +---------+---------------+---------+-----------+----------+--------------+ CFV      Full           Yes      Yes                                 +---------+---------------+---------+-----------+----------+--------------+ SFJ      Full                                                        +---------+---------------+---------+-----------+----------+--------------+ FV Prox  Full                                                        +---------+---------------+---------+-----------+----------+--------------+ FV Mid   Full                                                        +---------+---------------+---------+-----------+----------+--------------+ FV DistalFull                                                        +---------+---------------+---------+-----------+----------+--------------+ PFV      Full                                                        +---------+---------------+---------+-----------+----------+--------------+ POP      Full           Yes      Yes                                 +---------+---------------+---------+-----------+----------+--------------+ PTV      Full                                                        +---------+---------------+---------+-----------+----------+--------------+  PERO     Full                                                         +---------+---------------+---------+-----------+----------+--------------+   +---------+---------------+---------+-----------+----------+--------------+ LEFT     CompressibilityPhasicitySpontaneityPropertiesThrombus Aging +---------+---------------+---------+-----------+----------+--------------+ CFV      Full           Yes      Yes                                 +---------+---------------+---------+-----------+----------+--------------+ SFJ      Full                                                        +---------+---------------+---------+-----------+----------+--------------+ FV Prox  Full                                                        +---------+---------------+---------+-----------+----------+--------------+ FV Mid   Full                                                        +---------+---------------+---------+-----------+----------+--------------+ FV DistalFull                                                        +---------+---------------+---------+-----------+----------+--------------+ PFV      Full                                                        +---------+---------------+---------+-----------+----------+--------------+ POP      Full           Yes      Yes                                 +---------+---------------+---------+-----------+----------+--------------+ PTV      Full                                                        +---------+---------------+---------+-----------+----------+--------------+ PERO     Full                                                        +---------+---------------+---------+-----------+----------+--------------+  Summary: BILATERAL: - No evidence of deep vein thrombosis seen in the lower extremities, bilaterally.   *See table(s) above for measurements and observations.    Preliminary              Medical Problem  List and Plan: 1.  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  Status post loop recorder             -patient may  shower             -ELOS/Goals: 9-12d Sup 2.  Antithrombotics: -DVT/anticoagulation: SCDs.  Lower extremity Dopplers negative.             -antiplatelet therapy: Aspirin 81 mg daily, Brilinta 90 mg twice daily 3. Pain Management: Tylenol as needed 4. Mood: Provide emotional support             -antipsychotic agents: N/A 5. Neuropsych: This patient is capable of making decisions on his own behalf. 6. Skin/Wound Care: Routine skin checks 7. Fluids/Electrolytes/Nutrition: Routine in and outs with follow-up chemistries 8.  Hypertension.  On no antihypertensive medication prior to admission.  Monitor with increased mobility 9.  Hyperlipidemia.  Lipitor 10.  New findings of diabetes mellitus.  Hemoglobin A1c 8.3.  SSI.  Diabetic teaching.    Lavon Paganini Angiulli, PA-C "I have personally performed a face to face diagnostic evaluation of this patient.  Additionally, I have reviewed and concur with the physician assistant's documentation above." Charlett Blake M.D. [facility] FAAPM&R (Neuromuscular Med) Diplomate Am Board of Electrodiagnostic Med Fellow Am Board of Interventional Pain

## 2019-06-09 NOTE — Plan of Care (Signed)
  Problem: Education: Goal: Knowledge of disease or condition will improve Outcome: Progressing Goal: Knowledge of secondary prevention will improve Outcome: Progressing Goal: Knowledge of patient specific risk factors addressed and post discharge goals established will improve Outcome: Progressing Goal: Individualized Educational Video(s) Outcome: Progressing   Problem: Coping: Goal: Will verbalize positive feelings about self Outcome: Progressing Goal: Will identify appropriate support needs Outcome: Progressing   Problem: Health Behavior/Discharge Planning: Goal: Ability to manage health-related needs will improve Outcome: Progressing   Problem: Self-Care: Goal: Ability to participate in self-care as condition permits will improve Outcome: Progressing Goal: Verbalization of feelings and concerns over difficulty with self-care will improve Outcome: Progressing Goal: Ability to communicate needs accurately will improve Description: Pt alert and oriented x 4, able to communicate needs. Pt dysarthric. Outcome: Progressing   Problem: Nutrition: Goal: Risk of aspiration will decrease Outcome: Progressing Goal: Dietary intake will improve Outcome: Progressing   Problem: Ischemic Stroke/TIA Tissue Perfusion: Goal: Complications of ischemic stroke/TIA will be minimized Outcome: Progressing   Problem: Education: Goal: Knowledge of General Education information will improve Description: Including pain rating scale, medication(s)/side effects and non-pharmacologic comfort measures Outcome: Progressing   Problem: Health Behavior/Discharge Planning: Goal: Ability to manage health-related needs will improve Outcome: Progressing   Problem: Clinical Measurements: Goal: Ability to maintain clinical measurements within normal limits will improve Outcome: Progressing Goal: Will remain free from infection Outcome: Progressing Goal: Diagnostic test results will improve Outcome:  Progressing Goal: Respiratory complications will improve Outcome: Progressing Goal: Cardiovascular complication will be avoided Outcome: Progressing   Problem: Activity: Goal: Risk for activity intolerance will decrease Outcome: Progressing   Problem: Nutrition: Goal: Adequate nutrition will be maintained Outcome: Progressing   Problem: Coping: Goal: Level of anxiety will decrease Outcome: Progressing   Problem: Elimination: Goal: Will not experience complications related to bowel motility Outcome: Progressing Goal: Will not experience complications related to urinary retention Outcome: Progressing   Problem: Pain Managment: Goal: General experience of comfort will improve Outcome: Progressing   Problem: Safety: Goal: Ability to remain free from injury will improve Outcome: Progressing   Problem: Skin Integrity: Goal: Risk for impaired skin integrity will decrease Outcome: Progressing

## 2019-06-10 ENCOUNTER — Inpatient Hospital Stay (HOSPITAL_COMMUNITY): Payer: Self-pay | Admitting: Occupational Therapy

## 2019-06-10 ENCOUNTER — Inpatient Hospital Stay (HOSPITAL_COMMUNITY): Payer: Self-pay | Admitting: Physical Therapy

## 2019-06-10 ENCOUNTER — Inpatient Hospital Stay (HOSPITAL_COMMUNITY): Payer: Self-pay | Admitting: Speech Pathology

## 2019-06-10 LAB — GLUCOSE, CAPILLARY
Glucose-Capillary: 127 mg/dL — ABNORMAL HIGH (ref 70–99)
Glucose-Capillary: 178 mg/dL — ABNORMAL HIGH (ref 70–99)
Glucose-Capillary: 117 mg/dL — ABNORMAL HIGH (ref 70–99)
Glucose-Capillary: 120 mg/dL — ABNORMAL HIGH (ref 70–99)
Glucose-Capillary: 150 mg/dL — ABNORMAL HIGH (ref 70–99)

## 2019-06-10 MED ORDER — TRAZODONE HCL 50 MG PO TABS
50.0000 mg | ORAL_TABLET | Freq: Every evening | ORAL | Status: DC | PRN
  Administered 2019-06-10 – 2019-06-15 (×5): 50 mg via ORAL
  Filled 2019-06-10 (×5): qty 1

## 2019-06-10 MED ORDER — NICOTINE 14 MG/24HR TD PT24
14.0000 mg | MEDICATED_PATCH | Freq: Every day | TRANSDERMAL | Status: DC
  Administered 2019-06-11 – 2019-06-16 (×6): 14 mg via TRANSDERMAL
  Filled 2019-06-10 (×6): qty 1

## 2019-06-10 NOTE — Evaluation (Addendum)
Speech Language Pathology Assessment and Plan  Patient Details  Name: Scott Day MRN: [id number] Date of Birth: 1960/05/25  SLP Diagnosis: Cognitive Impairments;Dysphagia  Rehab Potential: Good ELOS: 9-12 days    Today's Date: 06/10/2019 SLP Individual Time: 0700-0800 SLP Individual Time Calculation (min): 60 min   Problem List:  Patient Active Problem List   Diagnosis Date Noted  . 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 Cerebrovascular accident (CVA) due to thrombosis of right middle cerebral artery ([place])   . Essential hypertension   . Oropharyngeal dysphagia   . Left hemiparesis ([place])   . Class 1 obesity due to excess calories with serious comorbidity and body mass index (BMI) of 32.0 to 32.9 in adult   . Acute ischemic cerebrovascular accident (CVA) involving right middle cerebral artery territory ([place]) 06/04/2019  . Arterial ischemic stroke, MCA, right, acute ([place]) 06/04/2019   Past Medical History:  Past Medical History:  Diagnosis Date  . Cancer [facility]) 2012   colon cancer  . Closed fracture of left distal radius   . GERD (gastroesophageal reflux disease)    Past Surgical History:  Past Surgical History:  Procedure Laterality Date  . COLON SURGERY  2012   colon cancer  . IR CT HEAD LTD  06/04/2019  . IR CT HEAD LTD  06/04/2019  . IR INTRA CRAN STENT  06/04/2019  . IR PERCUTANEOUS ART THROMBECTOMY/INFUSION INTRACRANIAL INC DIAG ANGIO  06/04/2019  . IR US GUIDE VASC ACCESS RIGHT  06/04/2019  . OPEN REDUCTION INTERNAL FIXATION (ORIF) DISTAL RADIAL FRACTURE Left 09/20/2017   Procedure: OPEN REDUCTION INTERNAL FIXATION (ORIF) DISTAL RADIAL FRACTURE;  Surgeon: Leanora Cover, MD;  Location: [place];  Service: Orthopedics;  Laterality: Left;  . RADIOLOGY WITH ANESTHESIA N/A 06/04/2019   Procedure: IR WITH ANESTHESIA;  Surgeon: Radiologist, Medication, MD;  Location: [place];  Service: Radiology;  Laterality: N/A;    Assessment / Plan /  Recommendation Clinical Impression Scott Day is a 59 year old right-handed male with history of colon cancer in 2012, GERD, tobacco abuse. Per chart review lives alone independent prior to admission 1 level home 2 steps to entry. Presented 06/04/2018 while left-sided weakness and slurred speech. Admission chemistries unremarkable except glucose 213, sodium 133, alcohol negative. Cranial CT scan negative for acute changes. 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 Patient did not receive TPA. Underwent revascularization stent placement per interventional radiology DR [place],. 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 Echocardiogram with ejection fraction of 70% without emboli. Lower extremity Dopplers negative for DVT. Currently maintained on aspirin as well as Brilinta for CVA prophylaxis. Close monitoring of blood pressure initially on Cardene. TEEshowing ejection fraction of 65% without thrombusand underwent loop recorder placement. Findings of elevated hemoglobin A1c of 8.3 and placed on sliding scale insulin. Dysphagia #3 thin liquid diet. Therapy evaluation completed and patient was admitted for a comprehensive rehab program.  Clinical Swallow Evaluation: Patient presents with a mild oral dysphagia and no s/sx of aspiration. Patient demonstrated mildly prolonged mastication of a regular solid with no oral residues noted. No overt s/sx of aspiration were observed with thin liquids, puree, or solid consistency. Patient participated in an [place] on 06/06/19 revealing a mild oropharyngeal dysphagia with recommendations for a mechanical soft diet and thin liquids. Recommend continue mechanical soft diet and thin liquids.  Cognitive-Linguistic Evaluation: Patient presents with cognitive impairments in the areas of: memory,  awareness, problem solving, attention, and reasoning.  Patient participated in the [place] with the following scores: orientation 10/12 average range, attention 8/8 average range, registration 2 average range, comprehension 6/6 average range, repetition 12/12 average range, naming 7/8 average range, memory 8/12 mild impairment, calculations 2/4 mild impairment, similarities 4/8 mild impairment, and judgement 3/6 mild impairment. While attention was functional on the Cognistat poor attention to task was noted throughout evaluation tasks. Speech intelligibility appears improved this date. Patient was 100% intelligible in conversation. Patient endorsed "my speech intelligibility is better". Patient followed basic multi step directions independently. Patient responded to basic yes/no questions without difficulty.   Patient would benefit from skilled SLP services during CIR stay targeting cognition and dysphagia to increase functional independence upon discharge.   Skilled Therapeutic Interventions          Clinical swallow evaluation and cognitive-linguistic evaluation  SLP Assessment  Patient will need skilled Speech Lanaguage Pathology Services during CIR admission    Recommendations  SLP Diet Recommendations: Dysphagia 3 (Mech soft);Thin Liquid Administration via: Cup;Straw Medication Administration: Whole meds with puree Supervision: Patient able to self feed Compensations: Minimize environmental distractions;Slow rate;Small sips/bites Postural Changes and/or Swallow Maneuvers: Seated upright 90 degrees Oral Care Recommendations: Oral care BID Patient destination: Home Follow up Recommendations: Home Health SLP Equipment Recommended: None recommended by SLP    SLP Frequency 3 to 5 out of 7 days   SLP Duration  SLP Intensity  SLP Treatment/Interventions 9-12 days  Minumum of 1-2 x/day, 30 to 90 minutes  Cognitive remediation/compensation;Cueing hierarchy;Functional tasks;Therapeutic Activities;Therapeutic Exercise;Dysphagia/aspiration  precaution training;Medication managment;Patient/family education;Internal/external aids    Pain Pain Assessment Pain Scale: 0-10 Pain Score: 0-No pain  Prior Functioning Cognitive/Linguistic Baseline: Within functional limits Education: 10th grade Vocation: Full time employment  SLP Evaluation Cognition Overall Cognitive Status: Impaired/Different from baseline Arousal/Alertness: Awake/alert Orientation Level: Oriented X4 Attention: Impaired Memory: Impaired Memory Impairment: Decreased recall of new information;Decreased short term memory Decreased Short Term Memory: Verbal complex;Functional basic Awareness: Impaired Problem Solving: Impaired Problem Solving Impairment: Verbal complex;Functional basic Executive Function: Reasoning Reasoning: Impaired Behaviors: Impulsive Safety/Judgment: Impaired  Comprehension Auditory Comprehension Overall Auditory Comprehension: Appears within functional limits for tasks assessed Yes/No Questions: Within Functional Limits Commands: Within Functional Limits Expression Expression Primary Mode of Expression: Verbal Verbal Expression Overall Verbal Expression: Appears within functional limits for tasks assessed Initiation: No impairment Level of Generative/Spontaneous Verbalization: Conversation Repetition: No impairment Naming: No impairment Oral Motor Oral Motor/Sensory Function Overall Oral Motor/Sensory Function: Mild impairment Facial ROM: Within Functional Limits Facial Symmetry: Abnormal symmetry left Facial Strength: Reduced left;Suspected CN VII (facial) dysfunction Lingual ROM: Within Functional Limits Lingual Symmetry: Within Functional Limits Lingual Strength: Reduced Mandible: Within Functional Limits Motor Speech Overall Motor Speech: Appears within functional limits for tasks assessed Respiration: Within functional limits Phonation: Normal Articulation: Within functional limitis  Bedside Swallowing  Assessment General Date of Onset: 06/04/19 Previous Swallow Assessment: MBS 06/06/19 Mild oropharyngeal dysphagia Diet Prior to this Study: Dysphagia 3 (soft);Thin liquids Temperature Spikes Noted: No Respiratory Status: Room air Behavior/Cognition: Cooperative;Alert Oral Cavity - Dentition: Adequate natural dentition;Missing dentition Self-Feeding Abilities: Able to feed self;Needs set up Vision: Functional for self-feeding Patient Positioning: Upright in bed Baseline Vocal Quality: Normal  Thin Liquid Thin Liquid: Within functional limits Puree Puree: Within functional limits Solid Solid: Impaired Presentation: Self Fed Oral Phase Impairments: Impaired mastication BSE Assessment Risk for Aspiration Impact on safety and function: Mild aspiration risk Other Related Risk Factors: History of GERD;Cognitive impairment  Short Term Goals: Week 1:  SLP Short Term Goal 1 (Week 1): Patient will demonstrate efficient mastication and oral clerance of rgular textures over 2 observed sessions to demonstrate readiness for diet advancement. SLP Short Term Goal 2 (Week 1): Patient will complete higher level reasoning and executive functioning tasks (ex: medication and money management) with 90% accuracy and min cues. SLP Short Term Goal 3 (Week 1): Patient will demonstrate functional problem sovling for basic and familiar (mildly complex) tasks with min verbal cues. SLP Short Term Goal 4 (Week 1): Patient will utilize external memory aids to recall new, daily information with min verbal cues. SLP Short Term Goal 5 (Week 1): Patient will demonstrate sustained attention to functional task for 20 minutes with min verbal cues for redirection.  Refer to Care Plan for Long Term Goals  Recommendations for other services: None   Discharge Criteria: Patient will be discharged from SLP if patient refuses treatment 3 consecutive times without medical reason, if treatment goals not met, if there is a change  in medical status, if patient makes no progress towards goals or if patient is discharged from hospital.  The above assessment, treatment plan, treatment alternatives and goals were discussed and mutually agreed upon: by patient  Cristy Folks 06/10/2019, 10:08 AM

## 2019-06-10 NOTE — Evaluation (Signed)
Physical Therapy Assessment and Plan  Patient Details  Name: Scott Day MRN: [id number] Date of Birth: 1960/03/29  PT Diagnosis: Abnormality of gait and Hemiparesis non-dominant Rehab Potential: Excellent ELOS: 7 days   Today's Date: 06/10/2019 PT Individual Time: 1301-1416 PT Individual Time Calculation (min): 75 min    Problem List:  Patient Active Problem List   Diagnosis Date Noted  . 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 Cerebrovascular accident (CVA) due to thrombosis of right middle cerebral artery ([place])   . Essential hypertension   . Oropharyngeal dysphagia   . Left hemiparesis ([place])   . Class 1 obesity due to excess calories with serious comorbidity and body mass index (BMI) of 32.0 to 32.9 in adult   . Acute ischemic cerebrovascular accident (CVA) involving right middle cerebral artery territory ([place]) 06/04/2019  . Arterial ischemic stroke, MCA, right, acute ([place]) 06/04/2019    Past Medical History:  Past Medical History:  Diagnosis Date  . Cancer [facility]) 2012   colon cancer  . Closed fracture of left distal radius   . GERD (gastroesophageal reflux disease)    Past Surgical History:  Past Surgical History:  Procedure Laterality Date  . COLON SURGERY  2012   colon cancer  . IR CT HEAD LTD  06/04/2019  . IR CT HEAD LTD  06/04/2019  . IR INTRA CRAN STENT  06/04/2019  . IR PERCUTANEOUS ART THROMBECTOMY/INFUSION INTRACRANIAL INC DIAG ANGIO  06/04/2019  . IR US GUIDE VASC ACCESS RIGHT  06/04/2019  . OPEN REDUCTION INTERNAL FIXATION (ORIF) DISTAL RADIAL FRACTURE Left 09/20/2017   Procedure: OPEN REDUCTION INTERNAL FIXATION (ORIF) DISTAL RADIAL FRACTURE;  Surgeon: Leanora Cover, MD;  Location: [place];  Service: Orthopedics;  Laterality: Left;  . RADIOLOGY WITH ANESTHESIA N/A 06/04/2019   Procedure: IR WITH ANESTHESIA;  Surgeon: Radiologist, Medication, MD;  Location: [place];  Service: Radiology;  Laterality: N/A;    Assessment &  Plan Clinical Impression: [name] is a 59 year old right-handed male with history of colon cancer in 2012, GERD, tobacco abuse. Per chart review lives alone independent prior to admission 1 level home 2 steps to entry. Presented 06/04/2018 while left-sided weakness and slurred speech. Admission chemistries unremarkable except glucose 213, sodium 133, alcohol negative. Cranial CT scan negative for acute changes. 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 Patient did not receive TPA. Underwent revascularization stent placement per interventional radiology DR [place],. 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 Echocardiogram with ejection fraction of 70% without emboli. Lower extremity Dopplers negative for DVT. Currently maintained on aspirin as well as Brilinta for CVA prophylaxis. Close monitoring of blood pressure initially on Cardene. TEEshowing ejection fraction of 65% without thrombusand underwent loop recorder placement. Findings of elevated hemoglobin A1c of 8.3 and placed on sliding scale insulin. Dysphagia #3 thin liquid diet. Therapy evaluation completed and patient was admitted for a comprehensive rehab program..   Patient currently requires min with mobility secondary to muscle weakness, decreased coordination and decreased standing balance.  Prior to hospitalization, patient was independent  with mobility and lived with Alone in a House home.  Home access is 2Stairs to enter.  Patient will benefit from skilled PT intervention to maximize safe functional mobility, minimize fall risk and decrease caregiver burden for planned discharge home with 24 hour supervision.  Anticipate patient will benefit from follow up OP at discharge.  PT - End  of Session Activity Tolerance: Endurance does not limit participation in activity Endurance Deficit: No PT Assessment Rehab  Potential (ACUTE/IP ONLY): Excellent PT Barriers to Discharge: Lack of/limited family support PT Barriers to Discharge Comments: safety issues. PT Patient demonstrates impairments in the following area(s): Balance;Safety;Motor PT Transfers Functional Problem(s): Bed Mobility;Bed to Chair;Car;Furniture;Floor PT Locomotion Functional Problem(s): Ambulation;Stairs PT Plan PT Intensity: Minimum of 1-2 x/day ,45 to 90 minutes PT Duration Estimated Length of Stay: 7 days PT Treatment/Interventions: Ambulation/gait training;Discharge planning;DME/adaptive equipment instruction;Functional mobility training;Therapeutic Activities;UE/LE Strength taining/ROM;Balance/vestibular training;Community reintegration;Neuromuscular re-education;Patient/family education;Stair training;Therapeutic Exercise;UE/LE Coordination activities PT Transfers Anticipated Outcome(s): Mod I from all surfaces, including floor. PT Locomotion Anticipated Outcome(s): Supervision w/o AD 2/2 impulsive behavior and mild left inattention, appears to have improved. PT Recommendation Follow Up Recommendations: Outpatient PT Patient destination: Home Equipment Recommended: To be determined  Skilled Therapeutic Intervention Evaluation completed (see details above and below) with education on PT POC and goals and individual treatment initiated with focus on safety and high-level balance.  Pt presents sitting in recliner reclined all the way.  Pt agreeable to therapy.  Pt requires CGA to close supervision for all transfers d/t impulsive behavior.  Pt transferred from recliner, in/OOB, car mat table and floor w/ CGA.  Pt has some inattention to left side, but improving.  Pt amb multiple trials up to 150' w/o AD or LOB, minimal verbal cues for left objects.  Pt negotiated 4 steps w/ 1 rail and CGA as well as 1 8" step (curb) w/o AD.  Pt performed multiple high-level balance activities including carioca, tandem gait, stepping over objects on  floor, carrying blue T-ball and Dynavision 2" trials x 5 reaching across midline, and extreme reach to left using left pattern and then complete.  Pt returned to room and reclined in recliner.  Alarm on and needs in reach.  Daughter present throughout session.   PT Evaluation Precautions/Restrictions Precautions Precautions: Fall Precaution Comments: safety, impulsive. Restrictions Weight Bearing Restrictions: No General Chart Reviewed: Yes Family/Caregiver Present: Yes(daughter) Vital Signs  Pain Pain Assessment Pain Score: 0-No pain Home Living/Prior Functioning Home Living Available Help at Discharge: Family;Available 24 hours/day(daughter states will make sure he has whatever supervision he needs when D/C'd.) Type of Home: House Home Access: Stairs to enter Entrance Stairs-Number of Steps: 2 Entrance Stairs-Rails: None(states can have railings put on if needed.) Home Layout: One level Bathroom Shower/Tub: Chiropodist: Standard  Lives With: Alone Prior Function Level of Independence: Independent with basic ADLs;Independent with homemaking with ambulation;Independent with transfers;Independent with gait(driving and working in [employer].)  Able to [employer]?: Reciprically Driving: Yes Vocation: Full time employment Vision/Perception  Vision - Assessment Alignment/Gaze Preference: Gaze right(mild) Perception Perception: Within Functional Limits Praxis Praxis: Intact  Cognition Overall Cognitive Status: Impaired/Different from baseline(daughter did correct him on where he was right now.) Arousal/Alertness: Awake/alert Orientation Level: Oriented X4 Attention: Focused;Sustained Focused Attention: Impaired Focused Attention Impairment: Functional complex Sustained Attention: Impaired Sustained Attention Impairment: Verbal complex;Functional basic Memory: Impaired Memory Impairment: Decreased short term memory Immediate Memory Recall:  Sock;Blue;Bed Memory Recall Sock: Without Cue Memory Recall Blue: Without Cue Memory Recall Bed: Without Cue Awareness: Impaired Behaviors: Impulsive Safety/Judgment: Impaired Sensation Sensation Light Touch: Appears Intact(left finger tips c/o numbness.) Coordination Gross Motor Movements are Fluid and Coordinated: No Fine Motor Movements are Fluid and Coordinated: No Coordination and Movement Description: . Finger Nose Finger Test: Mild dysmetria on the Lt, WNL Rt Heel Shin Test: decreased on left. Motor  Motor Motor: Hemiplegia  Mobility  Bed Mobility Bed Mobility: Sit to Supine;Supine to Sit Supine to Sit: Supervision/Verbal cueing;Contact Guard/Touching assist Sit to Supine: Contact Guard/Touching assist Transfers Transfers: Sit to Stand;Stand to Sit;Stand Pivot Transfers Sit to Stand: Contact Guard/Touching assist Stand to Sit: Contact Guard/Touching assist Stand Pivot Transfers: Contact Guard/Touching assist Transfer (Assistive device): None Locomotion  Gait Ambulation: Yes Gait Assistance: Contact Guard/Touching assist Gait Distance (Feet): 150 Feet Assistive device: None Gait Gait: Yes Gait Pattern: Step-through pattern High Level Ambulation High Level Ambulation: Side stepping;Other high level ambulation High Level Ambulation - Other Comments: carioca, tandem gait, stepping over objects. Stairs / Additional Locomotion Stairs: Yes Stairs Assistance: Contact Guard/Touching assist Stair Management Technique: One rail Right Number of Stairs: 4 Height of Stairs: 6 Ramp: Contact Guard/touching assist Curb: Contact Guard/Touching assist Wheelchair Mobility Wheelchair Mobility: No  Trunk/Postural Assessment  Cervical Assessment Cervical Assessment: Within Functional Limits Thoracic Assessment Thoracic Assessment: Within Functional Limits Lumbar Assessment Lumbar Assessment: Within Functional Limits Postural Control Postural Control: Within Functional  Limits  Balance Balance Balance Assessed: Yes Berg Balance Test Sit to Stand: Able to stand  independently using hands Standing Unsupported: Able to stand safely 2 minutes Stand to Sit: Sits safely with minimal use of hands Transfers: Able to transfer safely, minor use of hands Dynamic Sitting Balance Dynamic Sitting - Level of Assistance: 5: Stand by assistance Dynamic Standing Balance Dynamic Standing - Level of Assistance: 4: Min assist(d/t impulsivity) Dynamic Standing - Balance Activities: Reaching across midline Extremity Assessment  RUE Assessment RUE Assessment: Within Functional Limits Active Range of Motion (AROM) Comments: WNL General Strength Comments: 105lbs hand strength via Dynamometer testing LUE Assessment LUE Assessment: Exceptions to [facility] Active Range of Motion (AROM) Comments: WNL General Strength Comments: 74lbs hand strength via Dynamometer testing LUE Body System: Neuro Brunstrum levels for arm and hand: Arm;Hand Brunstrum level for arm: Stage V Relative Independence from Synergy Brunstrum level for hand: Stage V Independence from basic synergies LUE Tone LUE Tone: Within Functional Limits RLE Assessment RLE Assessment: Within Functional Limits LLE Assessment LLE Assessment: Within Functional Limits    Refer to Care Plan for Long Term Goals  Recommendations for other services: None   Discharge Criteria: Patient will be discharged from PT if patient refuses treatment 3 consecutive times without medical reason, if treatment goals not met, if there is a change in medical status, if patient makes no progress towards goals or if patient is discharged from hospital.  The above assessment, treatment plan, treatment alternatives and goals were discussed and mutually agreed upon: by patient and by family  Ladoris Gene 06/10/2019, 4:14 PM

## 2019-06-10 NOTE — Anesthesia Postprocedure Evaluation (Signed)
Anesthesia Post Note  Patient: Scott Day  Procedure(s) Performed: TRANSESOPHAGEAL ECHOCARDIOGRAM (TEE) (N/A ) BUBBLE STUDY     Patient location during evaluation: Endoscopy Anesthesia Type: General Level of consciousness: awake and alert Pain management: pain level controlled Vital Signs Assessment: post-procedure vital signs reviewed and stable Respiratory status: spontaneous breathing, nonlabored ventilation, respiratory function stable and patient connected to nasal cannula oxygen Cardiovascular status: stable and blood pressure returned to baseline Postop Assessment: no apparent nausea or vomiting Anesthetic complications: no    Last Vitals:  Vitals:   06/09/19 0751 06/09/19 0753  BP: 112/68 112/68  Pulse: 78 82  Resp: 16 16  Temp:  36.9 C  SpO2: 97% 97%    Last Pain:  Vitals:   06/09/19 0753  TempSrc: Oral  PainSc: Asleep                 Teana Lindahl COKER

## 2019-06-10 NOTE — Evaluation (Addendum)
Occupational Therapy Assessment and Plan  Patient Details  Name: Scott Day MRN: [id number] Date of Birth: 1960-08-04  OT Diagnosis: cognitive deficits, hemiplegia affecting non-dominant side and muscle weakness (generalized) Rehab Potential: Rehab Potential (ACUTE ONLY): Excellent ELOS: 7-10 days   Today's Date: 06/10/2019 OT Individual Time: 9935-7017 OT Individual Time Calculation (min): 60 min     Problem List:  Patient Active Problem List   Diagnosis Date Noted  . 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 Cerebrovascular accident (CVA) due to thrombosis of right middle cerebral artery ([place])   . Essential hypertension   . Oropharyngeal dysphagia   . Left hemiparesis ([place])   . Class 1 obesity due to excess calories with serious comorbidity and body mass index (BMI) of 32.0 to 32.9 in adult   . Acute ischemic cerebrovascular accident (CVA) involving right middle cerebral artery territory ([place]) 06/04/2019  . Arterial ischemic stroke, MCA, right, acute ([place]) 06/04/2019    Past Medical History:  Past Medical History:  Diagnosis Date  . Cancer [facility]) 2012   colon cancer  . Closed fracture of left distal radius   . GERD (gastroesophageal reflux disease)    Past Surgical History:  Past Surgical History:  Procedure Laterality Date  . COLON SURGERY  2012   colon cancer  . IR CT HEAD LTD  06/04/2019  . IR CT HEAD LTD  06/04/2019  . IR INTRA CRAN STENT  06/04/2019  . IR PERCUTANEOUS ART THROMBECTOMY/INFUSION INTRACRANIAL INC DIAG ANGIO  06/04/2019  . IR US GUIDE VASC ACCESS RIGHT  06/04/2019  . OPEN REDUCTION INTERNAL FIXATION (ORIF) DISTAL RADIAL FRACTURE Left 09/20/2017   Procedure: OPEN REDUCTION INTERNAL FIXATION (ORIF) DISTAL RADIAL FRACTURE;  Surgeon: Leanora Cover, MD;  Location: [facility];  Service: Orthopedics;  Laterality: Left;  . RADIOLOGY WITH ANESTHESIA N/A 06/04/2019   Procedure: IR WITH ANESTHESIA;  Surgeon: Radiologist, Medication, MD;   Location: [place];  Service: Radiology;  Laterality: N/A;    Assessment & Plan Clinical Impression:  Scott Day is a 59 year old right-handed male with history of colon cancer in 2012, GERD, tobacco abuse. Per chart review lives alone independent prior to admission 1 level home 2 steps to entry. Presented 06/04/2019 while left-sided weakness and slurred speech. Admission chemistries unremarkable except glucose 213, sodium 133, alcohol negative. Cranial CT scan negative for acute changes. 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 Patient did not receive TPA. Underwent revascularization stent placement per interventional radiology Scott Day,. 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 Echocardiogram with ejection fraction of 70% without emboli. Lower extremity Dopplers negative for DVT. Currently maintained on aspirin as well as Brilinta for CVA prophylaxis. Close monitoring of blood pressure initially on Cardene. TEEshowing ejection fraction of 65% without thrombusand underwent loop recorder placement. Findings of elevated hemoglobin A1c of 8.3 and placed on sliding scale insulin. Dysphagia #3 thin liquid diet. Therapy evaluation completed and patient was admitted for a comprehensive rehab program.   Patient currently requires min with basic self-care skills secondary to muscle weakness, decreased cardiorespiratoy endurance, unbalanced muscle activation and decreased coordination, decreased awareness, decreased safety awareness and decreased memory and decreased standing balance and hemiplegia.  Prior to hospitalization, patient could complete BADLs with independent .  Patient will benefit from skilled intervention to increase independence with basic self-care skills prior to discharge home with family support.  Anticipate patient will require 24 hour  supervision and HHOT or  OPOT f/u.  OT Assessment Rehab Potential (ACUTE ONLY): Excellent OT Barriers to Discharge: Medical stability OT Patient demonstrates impairments in the following area(s): Balance;Behavior;Safety;Cognition;Endurance;Motor OT Basic ADL's Functional Problem(s): Grooming;Bathing;Dressing;Toileting OT Advanced ADL's Functional Problem(s): Simple Meal Preparation;Laundry OT Transfers Functional Problem(s): Toilet;Tub/Shower OT Additional Impairment(s): None OT Plan OT Intensity: Minimum of 1-2 x/day, 45 to 90 minutes OT Frequency: 5 out of 7 days OT Duration/Estimated Length of Stay: 7-10 days OT Treatment/Interventions: Balance/vestibular training;Discharge planning;Pain management;Self Care/advanced ADL retraining;Therapeutic Activities;UE/LE Coordination activities;Therapeutic Exercise;Patient/family education;Functional mobility training;Disease mangement/prevention;Cognitive remediation/compensation;DME/adaptive equipment instruction;Neuromuscular re-education;Psychosocial support;UE/LE Strength taining/ROM;Community reintegration OT Self Feeding Anticipated Outcome(s): No goal OT Basic Self-Care Anticipated Outcome(s): Supervision/cuing OT Toileting Anticipated Outcome(s): Supervision/cuing OT Bathroom Transfers Anticipated Outcome(s): Supervision/cuing OT Recommendation Patient destination: Home Follow Up Recommendations: 24 hour supervision/assistance Equipment Recommended: To be determined   Skilled Therapeutic Intervention Skilled OT session completed with focus on initial evaluation, education on OT role/POC, and establishment of patient-centered goals.   Pt greeted in bed with no c/o pain, just reporting feeling very hot and that he was sweating. Temperature in room was 60 degrees. BP WNL (124/63). Pt agreeable to participating in cold sponge bath at the edge of the bed during session. Dtr Scott Day) arrived at this time, reporting that pt likes the temperature around 65 degrees at  home and is cold-natured at baseline. Supine<sit from flat bed without bedrails completed with supervision assist. Pt initiated integrating L UE into bathing and dressing tasks without cuing. Able to utilize figure 4 position bilaterally to wash and dress feet. CGA for dynamic standing to complete perihygiene and LB dressing without device. Ambulatory transfer to sink completed with CGA and no AD. Pt required min cuing to turn off faucet before leaving sink and also to visually scan for paper towels. Pt reported needing to void bladder, ambulated to the toilet in the same manner. Pt urinated while standing and did not lift the toilet seat, leaving a decent amount of urine on the seat. He needed cuing to wipe off seat before sitting down for simulated toilet transfer. Pt then completed TTB transfer with CGA. Noted that pt throughout session was a bit impulsive, initiating reaching to floor to retrieve items several times or standing without OT present beside him. Noted mild Rt gaze preference but able to find items placed on his Lt side when cued to locate them. At end of session pt transferred to the recliner and was left with all needs within reach and safety belt fastened.   Dtr present throughout session, involved in [place] and asking appropriate questions, aware that pt needs staff to assist him with OOB transfers.   OT Evaluation Precautions/Restrictions  Precautions Precaution Comments: Mild Lt hemi Home Living/Prior [place] expects to be discharged to:: Private residence Living Arrangements: Children Available Help at Discharge: Family, Available 24 hours/day Type of Home: House Home Layout: One level Bathroom Shower/Tub: Chiropodist: Standard  Lives With: Alone IADL History Homemaking Responsibilities: Yes(Per dtr, pt was physically able to complete IADLs, but he preferred for family and friends to assist out of convenience) Education: 10th  grade Occupation: Full time employment Type of Occupation: Working Architect, was Engineer, [employer] house [employer] Leisure and Hobbies: Location manager Prior Function Level of Independence: Independent with basic ADLs, Independent with homemaking with ambulation, Independent with transfers, Independent with gait Driving: Yes Vocation: Full time employment ADL ADL Eating: Not assessed Grooming: Supervision/safety Where Assessed-Grooming: Standing at sink Upper Body Bathing: Supervision/safety Where  Assessed-Upper Body Bathing: Edge of bed Lower Body Bathing: Contact guard Where Assessed-Lower Body Bathing: Edge of bed Upper Body Dressing: Supervision/safety Where Assessed-Upper Body Dressing: Edge of bed Lower Body Dressing: Contact guard Where Assessed-Lower Body Dressing: Edge of bed Toileting: Contact guard Where Assessed-Toileting: Glass blower/designer: Therapist, music Method: Ambulating(no AD) Nutritional therapist Method: Ambulating(no AD) Vision Baseline Vision/History: No visual deficits Patient Visual Report: No change from baseline Alignment/Gaze Preference: Gaze right(mild) Perception  Perception: Within Functional Limits Praxis Praxis: Intact Cognition Overall Cognitive Status: Impaired/Different from baseline Arousal/Alertness: Awake/alert Orientation Level: Person;Place;Situation Person: Oriented Place: Oriented Situation: Oriented Year: 2021 Month: March Day of Week: Correct Memory: Impaired Memory Impairment: Decreased short term memory Decreased Short Term Memory: Verbal complex;Functional basic Immediate Memory Recall: Sock;Blue;Bed Memory Recall Sock: Without Cue Memory Recall Blue: Without Cue Memory Recall Bed: Without Cue Attention: Alternating Awareness: Impaired Problem Solving: Impaired Problem Solving Impairment: Verbal complex;Functional basic Executive Function: Reasoning Reasoning:  Impaired Behaviors: Impulsive Safety/Judgment: Impaired Sensation Coordination Gross Motor Movements are Fluid and Coordinated: No Fine Motor Movements are Fluid and Coordinated: No Coordination and Movement Description: Mild Lt hemi Finger Nose Finger Test: Mild dysmetria on the Lt, WNL Rt Motor  Motor Motor: Hemiplegia Mobility    CGA ambulatory toilet and TTB transfers without AD Trunk/Postural Assessment  Postural Control Postural Control: Within Functional Limits  Balance Balance Balance Assessed: Yes Dynamic Sitting Balance Dynamic Sitting - Level of Assistance: 5: Stand by assistance(donning gripper socks EOB) Dynamic Standing Balance Dynamic Standing - Level of Assistance: 4: Min assist(Clothing mgt during toileting) Extremity/Trunk Assessment RUE Assessment RUE Assessment: Within Functional Limits Active Range of Motion (AROM) Comments: WNL General Strength Comments: 105lbs hand strength via Dynamometer testing LUE Assessment LUE Assessment: Exceptions to [facility] Active Range of Motion (AROM) Comments: WNL General Strength Comments: 74lbs hand strength via Dynamometer testing LUE Body System: Neuro Brunstrum levels for arm and hand: Arm;Hand Brunstrum level for arm: Stage V Relative Independence from Synergy Brunstrum level for hand: Stage V Independence from basic synergies LUE Tone LUE Tone: Within Functional Limits   Refer to Care Plan for Long Term Goals  Recommendations for other services: None    Discharge Criteria: Patient will be discharged from OT if patient refuses treatment 3 consecutive times without medical reason, if treatment goals not met, if there is a change in medical status, if patient makes no progress towards goals or if patient is discharged from hospital.  The above assessment, treatment plan, treatment alternatives and goals were discussed and mutually agreed upon: by patient  Skeet Simmer 06/10/2019, 12:35 PM

## 2019-06-10 NOTE — Progress Notes (Signed)
[name] PHYSICAL MEDICINE & REHABILITATION PROGRESS NOTE   Subjective/Complaints:  Some burning with urination , Ucx pending   Daughter at bedside   ROS- neg CP, SOB N/V/D  Objective:   DG Chest Port 1 View  Result Date: 06/09/2019 CLINICAL DATA:  Encounter for leukocytosis. Pt admitted for a stroke "several weeks ago." Pt denies hx of heart or lung problems. EXAM: PORTABLE CHEST - 1 VIEW COMPARISON:  06/05/2019 FINDINGS: New ill-defined interstitial opacities in both lung bases. No effusion. No pneumothorax. Heart size normal. Nasogastric tube has been removed. Regional bones unremarkable IMPRESSION: New bibasilar interstitial infiltrates, edema, or subsegmental atelectasis Electronically Signed   By: Lucrezia Europe M.D.   On: 06/09/2019 11:43   ECHO TEE  Result Date: 06/08/2019    TRANSESOPHOGEAL ECHO REPORT   Patient Name:   Scott Day Date of Exam: 06/08/2019 Medical Rec #:  [id number]      Height:       71.0 in Accession #:    [id number]     Weight:       231.5 lb Date of Birth:  03-25-1960      BSA:          2.244 m Patient Age:    59 years       BP:           125/80 mmHg Patient Gender: M              HR:           97 bpm. Exam Location:  Inpatient Procedure: Transesophageal Echo and Saline Contrast Bubble Study Indications:     Stroke  History:         Patient has prior history of Echocardiogram examinations, most                  recent 06/05/2019. Risk Factors:Former Smoker and Hypertension.  Sonographer:     Clayton Lefort RDCS (AE) Referring Phys:  [place] Diagnosing Phys: Buford Dresser MD PROCEDURE: After discussion of the risks and benefits of a TEE, an informed consent was obtained from the patient. The transesophogeal probe was passed without difficulty through the esophogus of the patient. Sedation performed by different physician. The patient was monitored while under deep sedation. Anesthestetic sedation was provided intravenously by Anesthesiology: 345mg  of  Propofol, 60mg  of Lidocaine. Image quality was good. The patient's vital signs; including heart rate, blood pressure, and oxygen saturation; remained stable throughout the procedure. The patient developed no complications during the procedure. IMPRESSIONS  1. Left ventricular ejection fraction, by estimation, is 65 to 70%. The left ventricle has normal function. The left ventricle has no regional wall motion abnormalities. There is moderate concentric left ventricular hypertrophy. Left ventricular diastolic function could not be evaluated.  2. Right ventricular systolic function is normal. The right ventricular size is normal. Tricuspid regurgitation signal is inadequate for assessing PA pressure.  3. No left atrial/left atrial appendage thrombus was detected.  4. The mitral valve is normal in structure. Trivial mitral valve regurgitation. No evidence of mitral stenosis.  5. The aortic valve is tricuspid. Aortic valve regurgitation is not visualized. No aortic stenosis is present.  6. There is mild (Grade II) plaque involving the descending aorta.  7. Agitated saline contrast bubble study was negative, with no evidence of any interatrial shunt. Conclusion(s)/Recommendation(s): No LA/LAA thrombus identified. Negative bubble study for interatrial shunt. No intracardiac source of embolism detected on this on this transesophageal echocardiogram. FINDINGS  Left  Ventricle: Left ventricular ejection fraction, by estimation, is 65 to 70%. The left ventricle has normal function. The left ventricle has no regional wall motion abnormalities. The left ventricular internal cavity size was normal in size. There is  moderate concentric left ventricular hypertrophy. Left ventricular diastolic function could not be evaluated. Right Ventricle: The right ventricular size is normal. No increase in right ventricular wall thickness. Right ventricular systolic function is normal. Tricuspid regurgitation signal is inadequate for assessing  PA pressure. Left Atrium: Left atrial size was not assessed. No left atrial/left atrial appendage thrombus was detected. Right Atrium: Right atrial size was not assessed. Pericardium: There is no evidence of pericardial effusion. Mitral Valve: The mitral valve is normal in structure. Trivial mitral valve regurgitation. No evidence of mitral valve stenosis. Tricuspid Valve: The tricuspid valve is normal in structure. Tricuspid valve regurgitation is trivial. No evidence of tricuspid stenosis. Aortic Valve: The aortic valve is tricuspid. Aortic valve regurgitation is not visualized. No aortic stenosis is present. Pulmonic Valve: The pulmonic valve was grossly normal. Pulmonic valve regurgitation is trivial. No evidence of pulmonic stenosis. Aorta: The aortic root is normal in size and structure. There is mild (Grade II) plaque involving the descending aorta. IAS/Shunts: No atrial level shunt detected by color flow Doppler. Agitated saline contrast was given intravenously to evaluate for intracardiac shunting. Agitated saline contrast bubble study was negative, with no evidence of any interatrial shunt. There  is no evidence of a patent foramen ovale. Buford Dresser MD Electronically signed by Buford Dresser MD Signature Date/Time: 06/08/2019/2:06:11 PM    Final    Recent Labs    06/08/19 0345 06/09/19 0432  WBC 7.3 13.4*  HGB 14.8 15.0  HCT 43.5 43.1  PLT 219 212   Recent Labs    06/08/19 0345 06/09/19 0432  NA 142 137  K 3.5 3.3*  CL 109 105  CO2 22 19*  GLUCOSE 128* 128*  BUN 8 8  CREATININE 0.85 0.85  CALCIUM 9.0 9.0   No intake or output data in the 24 hours ending 06/10/19 [id number]   Physical Exam: Vital Signs Blood pressure 119/70, pulse 83, temperature 98.1 F (36.7 C), temperature source Oral, resp. rate 18, SpO2 98 %.   General: No acute distress Mood and affect are appropriate Heart: Regular rate and rhythm no rubs murmurs or extra sounds Lungs: Clear to  auscultation, breathing unlabored, no rales or wheezes Abdomen: Positive bowel sounds, soft nontender to palpation, nondistended Extremities: No clubbing, cyanosis, or edema Skin: No evidence of breakdown, no evidence of rash Neurologic: Cranial nerves II through XII intact, motor strength is 5/5 in RIght and 5- Left  deltoid, bicep, tricep, grip, hip flexor, knee extensors, ankle dorsiflexor and plantar flexor Sensory exam normal sensation to light touch and proprioception in bilateral upper and lower extremities Cerebellar exam normal finger to nose to finger as well as heel to shin in bilateral upper and lower extremities + Left visual field deficit Musculoskeletal: Full range of motion in all 4 extremities. No joint swelling    Assessment/Plan: 1. 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  Physiatrist is providing close team supervision and 24 hour management of active medical problems listed below.  Physiatrist and rehab team continue to assess barriers to discharge/monitor patient progress toward functional and medical goals  Care Tool:  Bathing  Bathing assist       Upper Body Dressing/Undressing Upper body dressing        Upper body assist      Lower Body Dressing/Undressing Lower body dressing            Lower body assist       Toileting Toileting    Toileting assist       Transfers Chair/bed transfer  Transfers assist           Locomotion Ambulation   Ambulation assist              Walk 10 feet activity   Assist           Walk 50 feet activity   Assist           Walk 150 feet activity   Assist           Walk 10 feet on uneven surface  activity   Assist           Wheelchair     Assist               Wheelchair 50 feet with 2 turns activity    Assist             Wheelchair 150 feet activity     Assist          Blood pressure 119/70, pulse 83, temperature 98.1 F (36.7 C), temperature source Oral, resp. rate 18, SpO2 98 %.    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 Status post loop recorder -patient may  shower -ELOS/Goals: 9-12d Sup 2. Antithrombotics: -DVT/anticoagulation:SCDs.Lower extremity Dopplers negative. -antiplatelet therapy: Aspirin 81 mg daily, Brilinta 90 mg twice daily 3. Pain Management:Tylenol as needed 4. Mood:Provide emotional support -antipsychotic agents: N/A 5. Neuropsych: This patientiscapable of making decisions on hisown behalf. 6. Skin/Wound Care:Routine skin checks 7. Fluids/Electrolytes/Nutrition:Routine in and outs with follow-up chemistries 8. Hypertension. On no antihypertensive medication prior to admission. Monitor with increased mobility 9. Hyperlipidemia. Lipitor 10. New findings of diabetes mellitus. Hemoglobin A1c 8.3. SSI. Diabetic teaching CBG (last 3)  Recent Labs    06/10/19 0611 06/10/19 0829 06/10/19 1156  GLUCAP 127* 178* 117*  Controlled   11.  UTI on bactrim await cx LOS: 1 days A FACE TO FACE EVALUATION WAS PERFORMED  Charlett Blake 06/10/2019, 6:33 AM

## 2019-06-10 NOTE — Evaluation (Signed)
Physical Therapy Assessment and Plan  Patient Details  Name: Scott Day MRN: [id number] Date of Birth: 15-Apr-1960  PT Diagnosis: Abnormality of gait, Hemiparesis non-dominant and Muscle weakness Rehab Potential: Excellent ELOS: 7 days   Today's Date: 06/10/2019 PT Individual Time: 1301-1416 PT Individual Time Calculation (min): 75 min    Problem List:  Patient Active Problem List   Diagnosis Date Noted  . 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 Cerebrovascular accident (CVA) due to thrombosis of right middle cerebral artery ([place])   . Essential hypertension   . Oropharyngeal dysphagia   . Left hemiparesis ([place])   . Class 1 obesity due to excess calories with serious comorbidity and body mass index (BMI) of 32.0 to 32.9 in adult   . Acute ischemic cerebrovascular accident (CVA) involving right middle cerebral artery territory ([place]) 06/04/2019  . Arterial ischemic stroke, MCA, right, acute ([place]) 06/04/2019    Past Medical History:  Past Medical History:  Diagnosis Date  . Cancer [facility]) 2012   colon cancer  . Closed fracture of left distal radius   . GERD (gastroesophageal reflux disease)    Past Surgical History:  Past Surgical History:  Procedure Laterality Date  . COLON SURGERY  2012   colon cancer  . IR CT HEAD LTD  06/04/2019  . IR CT HEAD LTD  06/04/2019  . IR INTRA CRAN STENT  06/04/2019  . IR PERCUTANEOUS ART THROMBECTOMY/INFUSION INTRACRANIAL INC DIAG ANGIO  06/04/2019  . IR US GUIDE VASC ACCESS RIGHT  06/04/2019  . OPEN REDUCTION INTERNAL FIXATION (ORIF) DISTAL RADIAL FRACTURE Left 09/20/2017   Procedure: OPEN REDUCTION INTERNAL FIXATION (ORIF) DISTAL RADIAL FRACTURE;  Surgeon: Leanora Cover, MD;  Location: [place];  Service: Orthopedics;  Laterality: Left;  . RADIOLOGY WITH ANESTHESIA N/A 06/04/2019   Procedure: IR WITH ANESTHESIA;  Surgeon: Radiologist, Medication, MD;  Location: [place];  Service: Radiology;  Laterality: N/A;     Assessment & Plan    Clinical Impression: Patient currently requires min with mobility secondary to muscle weakness, decreased coordination and decreased standing balance and difficulty maintaining precautions.  Prior to hospitalization, patient was independent  with mobility and lived with Alone in a House home.  Home access is 2Stairs to enter.  Patient will benefit from skilled PT intervention to maximize safe functional mobility, minimize fall risk and decrease caregiver burden for planned discharge home with 24 hour supervision.  Anticipate patient will benefit from follow up OP at discharge.  PT - End of Session Activity Tolerance: Endurance does not limit participation in activity Endurance Deficit: No PT Assessment Rehab Potential (ACUTE/IP ONLY): Excellent PT Barriers to Discharge: Lack of/limited family support PT Barriers to Discharge Comments: safety issues. PT Patient demonstrates impairments in the following area(s): Balance;Safety;Motor PT Transfers Functional Problem(s): Bed Mobility;Bed to Chair;Car;Furniture;Floor PT Locomotion Functional Problem(s): Ambulation;Stairs PT Plan PT Intensity: Minimum of 1-2 x/day ,45 to 90 minutes PT Duration Estimated Length of Stay: 7 days PT Treatment/Interventions: Ambulation/gait training;Discharge planning;DME/adaptive equipment instruction;Functional mobility training;Therapeutic Activities;UE/LE Strength taining/ROM;Balance/vestibular training;Community reintegration;Neuromuscular re-education;Patient/family education;Stair training;Therapeutic Exercise;UE/LE Coordination activities PT Transfers Anticipated Outcome(s): Mod I from all surfaces, including floor. PT Locomotion Anticipated Outcome(s): Supervision w/o AD 2/2 impulsive behavior and mild left inattention, appears to have improved. PT Recommendation Follow Up Recommendations: Outpatient PT Patient destination: Home Equipment Recommended: To be determined  Skilled  Therapeutic Intervention   PT Evaluation Precautions/Restrictions Precautions Precautions: Fall Precaution Comments: safety, impulsive. Restrictions Weight Bearing Restrictions: No General Chart Reviewed: Yes Family/Caregiver  Present: Yes(daughter) Vital Signs  Pain Pain Assessment Pain Score: 0-No pain Home Living/Prior Functioning Home Living Available Help at Discharge: Family;Available 24 hours/day(daughter states will make sure he has whatever supervision he needs when D/C'd.) Type of Home: House Home Access: Stairs to enter Entrance Stairs-Number of Steps: 2 Entrance Stairs-Rails: None(states can have railings put on if needed.) Home Layout: One level Bathroom Shower/Tub: Chiropodist: Standard  Lives With: Alone Prior Function Level of Independence: Independent with basic ADLs;Independent with homemaking with ambulation;Independent with transfers;Independent with gait(driving and working in [employer].)  Able to [employer]?: Reciprically Driving: Yes Vocation: Full time employment Vision/Perception  Vision - Assessment Alignment/Gaze Preference: Gaze right(mild) Perception Perception: Within Functional Limits Praxis Praxis: Intact  Cognition Overall Cognitive Status: Impaired/Different from baseline(daughter did correct him on where he was right now.) Arousal/Alertness: Awake/alert Orientation Level: Oriented X4 Attention: Focused;Sustained Focused Attention: Impaired Focused Attention Impairment: Functional complex Sustained Attention: Impaired Sustained Attention Impairment: Verbal complex;Functional basic Memory: Impaired Memory Impairment: Decreased short term memory Immediate Memory Recall: Sock;Blue;Bed Memory Recall Sock: Without Cue Memory Recall Blue: Without Cue Memory Recall Bed: Without Cue Awareness: Impaired Behaviors: Impulsive Safety/Judgment: Impaired Sensation Sensation Light Touch: Appears Intact(left finger  tips c/o numbness.) Coordination Gross Motor Movements are Fluid and Coordinated: No Fine Motor Movements are Fluid and Coordinated: No Coordination and Movement Description: . Finger Nose Finger Test: Mild dysmetria on the Lt, WNL Rt Heel Shin Test: decreased on left. Motor  Motor Motor: Hemiplegia  Mobility Bed Mobility Bed Mobility: Sit to Supine;Supine to Sit Supine to Sit: Supervision/Verbal cueing;Contact Guard/Touching assist Sit to Supine: Contact Guard/Touching assist Transfers Transfers: Sit to Stand;Stand to Sit;Stand Pivot Transfers Sit to Stand: Contact Guard/Touching assist Stand to Sit: Contact Guard/Touching assist Stand Pivot Transfers: Contact Guard/Touching assist Transfer (Assistive device): None Locomotion  Gait Ambulation: Yes Gait Assistance: Contact Guard/Touching assist Gait Distance (Feet): 150 Feet Assistive device: None Gait Gait: Yes Gait Pattern: Step-through pattern High Level Ambulation High Level Ambulation: Side stepping;Other high level ambulation High Level Ambulation - Other Comments: carioca, tandem gait, stepping over objects. Stairs / Additional Locomotion Stairs: Yes Stairs Assistance: Contact Guard/Touching assist Stair Management Technique: One rail Right Number of Stairs: 4 Height of Stairs: 6 Ramp: Contact Guard/touching assist Curb: Contact Guard/Touching assist Wheelchair Mobility Wheelchair Mobility: No  Trunk/Postural Assessment  Cervical Assessment Cervical Assessment: Within Functional Limits Thoracic Assessment Thoracic Assessment: Within Functional Limits Lumbar Assessment Lumbar Assessment: Within Functional Limits Postural Control Postural Control: Within Functional Limits  Balance Balance Balance Assessed: Yes Berg Balance Test Sit to Stand: Able to stand  independently using hands Standing Unsupported: Able to stand safely 2 minutes Stand to Sit: Sits safely with minimal use of hands Transfers: Able to  transfer safely, minor use of hands Dynamic Sitting Balance Dynamic Sitting - Level of Assistance: 5: Stand by assistance Dynamic Standing Balance Dynamic Standing - Level of Assistance: 4: Min assist(d/t impulsivity) Dynamic Standing - Balance Activities: Reaching across midline Extremity Assessment  RUE Assessment RUE Assessment: Within Functional Limits Active Range of Motion (AROM) Comments: WNL General Strength Comments: 105lbs hand strength via Dynamometer testing LUE Assessment LUE Assessment: Exceptions to [facility] Active Range of Motion (AROM) Comments: WNL General Strength Comments: 74lbs hand strength via Dynamometer testing LUE Body System: Neuro Brunstrum levels for arm and hand: Arm;Hand Brunstrum level for arm: Stage V Relative Independence from Synergy Brunstrum level for hand: Stage V Independence from basic synergies LUE Tone LUE Tone: Within Functional Limits RLE Assessment RLE Assessment:  Within Functional Limits LLE Assessment LLE Assessment: Within Functional Limits    Refer to Care Plan for Long Term Goals  Recommendations for other services: None   Discharge Criteria: Patient will be discharged from PT if patient refuses treatment 3 consecutive times without medical reason, if treatment goals not met, if there is a change in medical status, if patient makes no progress towards goals or if patient is discharged from hospital.  The above assessment, treatment plan, treatment alternatives and goals were discussed and mutually agreed upon: by patient and by family  Ladoris Gene 06/10/2019, 4:24 PM

## 2019-06-10 NOTE — Plan of Care (Signed)
  Problem: RH Balance Goal: LTG Patient will maintain dynamic standing with ADLs (OT) Description: LTG:  Patient will maintain dynamic standing balance with assist during activities of daily living (OT)  Flowsheets (Taken 06/10/2019 1619) LTG: Pt will maintain dynamic standing balance during ADLs with: Supervision/Verbal cueing   Problem: Sit to Stand Goal: LTG:  Patient will perform sit to stand in prep for activites of daily living with assistance level (OT) Description: LTG:  Patient will perform sit to stand in prep for activites of daily living with assistance level (OT) Flowsheets (Taken 06/10/2019 1619) LTG: PT will perform sit to stand in prep for activites of daily living with assistance level: Supervision/Verbal cueing   Problem: RH Bathing Goal: LTG Patient will bathe all body parts with assist levels (OT) Description: LTG: Patient will bathe all body parts with assist levels (OT) Flowsheets (Taken 06/10/2019 1619) LTG: Pt will perform bathing with assistance level/cueing: Supervision/Verbal cueing   Problem: RH Dressing Goal: LTG Patient will perform upper body dressing (OT) Description: LTG Patient will perform upper body dressing with assist, with/without cues (OT). Flowsheets (Taken 06/10/2019 1619) LTG: Pt will perform upper body dressing with assistance level of: Set up assist Goal: LTG Patient will perform lower body dressing w/assist (OT) Description: LTG: Patient will perform lower body dressing with assist, with/without cues in positioning using equipment (OT) Flowsheets (Taken 06/10/2019 1619) LTG: Pt will perform lower body dressing with assistance level of: Supervision/Verbal cueing   Problem: RH Toileting Goal: LTG Patient will perform toileting task (3/3 steps) with assistance level (OT) Description: LTG: Patient will perform toileting task (3/3 steps) with assistance level (OT)  Flowsheets (Taken 06/10/2019 1619) LTG: Pt will perform toileting task (3/3 steps)  with assistance level: Supervision/Verbal cueing   Problem: RH Toilet Transfers Goal: LTG Patient will perform toilet transfers w/assist (OT) Description: LTG: Patient will perform toilet transfers with assist, with/without cues using equipment (OT) Flowsheets (Taken 06/10/2019 1619) LTG: Pt will perform toilet transfers with assistance level of: Supervision/Verbal cueing   Problem: RH Tub/Shower Transfers Goal: LTG Patient will perform tub/shower transfers w/assist (OT) Description: LTG: Patient will perform tub/shower transfers with assist, with/without cues using equipment (OT) Flowsheets (Taken 06/10/2019 1619) LTG: Pt will perform tub/shower stall transfers with assistance level of: Supervision/Verbal cueing

## 2019-06-11 ENCOUNTER — Inpatient Hospital Stay (HOSPITAL_COMMUNITY): Payer: Self-pay

## 2019-06-11 ENCOUNTER — Inpatient Hospital Stay (HOSPITAL_COMMUNITY): Payer: Self-pay | Admitting: Occupational Therapy

## 2019-06-11 ENCOUNTER — Inpatient Hospital Stay (HOSPITAL_COMMUNITY): Payer: Self-pay | Admitting: Speech Pathology

## 2019-06-11 DIAGNOSIS — B9620 Unspecified Escherichia coli [E. coli] as the cause of diseases classified elsewhere: Secondary | ICD-10-CM

## 2019-06-11 DIAGNOSIS — N390 Urinary tract infection, site not specified: Secondary | ICD-10-CM

## 2019-06-11 DIAGNOSIS — E119 Type 2 diabetes mellitus without complications: Secondary | ICD-10-CM

## 2019-06-11 DIAGNOSIS — I69391 Dysphagia following cerebral infarction: Secondary | ICD-10-CM

## 2019-06-11 LAB — CBC WITH DIFFERENTIAL/PLATELET
Abs Immature Granulocytes: 0.05 10*3/uL (ref 0.00–0.07)
Basophils Absolute: 0.1 10*3/uL (ref 0.0–0.1)
Basophils Relative: 1 %
Eosinophils Absolute: 0.3 10*3/uL (ref 0.0–0.5)
Eosinophils Relative: 3 %
HCT: 44.5 % (ref 39.0–52.0)
Hemoglobin: 15.1 g/dL (ref 13.0–17.0)
Immature Granulocytes: 1 %
Lymphocytes Relative: 13 %
Lymphs Abs: 1.4 10*3/uL (ref 0.7–4.0)
MCH: 31.7 pg (ref 26.0–34.0)
MCHC: 33.9 g/dL (ref 30.0–36.0)
MCV: 93.3 fL (ref 80.0–100.0)
Monocytes Absolute: 1 10*3/uL (ref 0.1–1.0)
Monocytes Relative: 9 %
Neutro Abs: 8.1 10*3/uL — ABNORMAL HIGH (ref 1.7–7.7)
Neutrophils Relative %: 73 %
Platelets: 236 10*3/uL (ref 150–400)
RBC: 4.77 MIL/uL (ref 4.22–5.81)
RDW: 12.2 % (ref 11.5–15.5)
WBC: 10.9 10*3/uL — ABNORMAL HIGH (ref 4.0–10.5)
nRBC: 0 % (ref 0.0–0.2)

## 2019-06-11 LAB — COMPREHENSIVE METABOLIC PANEL
ALT: 42 U/L (ref 0–44)
AST: 25 U/L (ref 15–41)
Albumin: 3.3 g/dL — ABNORMAL LOW (ref 3.5–5.0)
Alkaline Phosphatase: 86 U/L (ref 38–126)
Anion gap: 11 (ref 5–15)
BUN: 10 mg/dL (ref 6–20)
CO2: 25 mmol/L (ref 22–32)
Calcium: 9 mg/dL (ref 8.9–10.3)
Chloride: 101 mmol/L (ref 98–111)
Creatinine, Ser: 1.07 mg/dL (ref 0.61–1.24)
GFR calc Af Amer: >60 mL/min (ref >60)
GFR calc non Af Amer: >60 mL/min (ref >60)
Glucose, Bld: 133 mg/dL — ABNORMAL HIGH (ref 70–99)
Potassium: 3.8 mmol/L (ref 3.5–5.1)
Sodium: 137 mmol/L (ref 135–145)
Total Bilirubin: 0.7 mg/dL (ref 0.3–1.2)
Total Protein: 6.8 g/dL (ref 6.5–8.1)

## 2019-06-11 LAB — GLUCOSE, CAPILLARY
Glucose-Capillary: 110 mg/dL — ABNORMAL HIGH (ref 70–99)
Glucose-Capillary: 111 mg/dL — ABNORMAL HIGH (ref 70–99)
Glucose-Capillary: 123 mg/dL — ABNORMAL HIGH (ref 70–99)
Glucose-Capillary: 130 mg/dL — ABNORMAL HIGH (ref 70–99)
Glucose-Capillary: 131 mg/dL — ABNORMAL HIGH (ref 70–99)

## 2019-06-11 MED ORDER — NITROFURANTOIN MONOHYD MACRO 100 MG PO CAPS
100.0000 mg | ORAL_CAPSULE | Freq: Two times a day (BID) | ORAL | Status: DC
  Administered 2019-06-11 (×2): 100 mg via ORAL
  Filled 2019-06-11 (×2): qty 1

## 2019-06-11 MED ORDER — METFORMIN HCL 500 MG PO TABS
250.0000 mg | ORAL_TABLET | Freq: Every day | ORAL | Status: DC
  Administered 2019-06-11 – 2019-06-16 (×6): 250 mg via ORAL
  Filled 2019-06-11 (×6): qty 1

## 2019-06-11 NOTE — Progress Notes (Signed)
PMR Admission Coordinator Pre-Admission Assessment   Patient: Scott Day is an 59 y.o., male MRN: [id number] DOB: [birth year] Height: 5\' 11"  (180.3 cm) Weight: 105 kg                                                                                                                                                  Insurance Information HMO:     PPO:      PCP:      IPA:      80/20:     OTHER:  PRIMARY: Uninsured. Financial counselor is [employer]. She can be reached at [phone]. Pt has been screened for Medicaid as of 06/07/19.    Medicaid Application Date:       Case Manager:  Disability Application Date:       Case Worker:    The "Data Collection Information Summary" for patients in Inpatient Rehabilitation Facilities with attached "Privacy Act [place] Records" was provided and verbally reviewed with: N/A   Emergency Contact Information Contact Information     Name Relation Home Work [place] Daughter     [phone]    Scott Day   [id number]           Current Medical History  Patient Admitting Diagnosis: CVA    History of Present Illness: Scott Day is a 59 year old right-handed male with history of colon cancer in 2012, GERD, tobacco abuse.  Presented 06/04/2019 while left-sided weakness and slurred speech.  Admission chemistries unremarkable except glucose 213, sodium 133, alcohol negative.  Cranial CT scan negative for acute changes.  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  Patient did not receive TPA.  Underwent revascularization stent placement per interventional radiology DR [place],.  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  Echocardiogram with ejection fraction of 70% without emboli.  Lower extremity Dopplers negative for DVT.  Currently maintained on aspirin as well as Brilinta for CVA prophylaxis.  Close monitoring  of blood pressure initially on Cardene.  TEE showing ejection fraction of 65% without thrombus.  Will need to f/u with outpatient cardiology as no loop recorder placed due to uninsured.  Also will need to ensure pt can afford Brilinta without insurance.  Findings of elevated hemoglobin A1c of 8.3 and placed on sliding scale insulin.  Dysphagia #3,  thin liquid diet.  Therapy evaluation completed and patient was recommended for a comprehensive rehab program.   Complete NIHSS TOTAL: 3 Glasgow Coma Scale Score: 15   Past Medical History      Past Medical History:  Diagnosis Date  . Cancer [facility]) 2012    colon cancer  . Closed fracture of left distal radius    . GERD (gastroesophageal reflux disease)  Family History  family history is not on file.   Prior Rehab/Hospitalizations:  Has the patient had prior rehab or hospitalizations prior to admission? No   Has the patient had major surgery during 100 days prior to admission? Yes   Current Medications    Current Facility-Administered Medications:  .   stroke: mapping our early stages of recovery book, , Does not apply, Once, Buford Dresser, MD .  acetaminophen (TYLENOL) tablet 650 mg, 650 mg, Oral, Q4H PRN, 650 mg at 06/07/19 1003 **OR** acetaminophen (TYLENOL) 160 MG/5ML solution 650 mg, 650 mg, Per Tube, Q4H PRN, 650 mg at 06/06/19 1055 **OR** acetaminophen (TYLENOL) suppository 650 mg, 650 mg, Rectal, Q4H PRN, Buford Dresser, MD .  aspirin chewable tablet 81 mg, 81 mg, Oral, Daily, Buford Dresser, MD, 81 mg at 06/08/19 1153 .  atorvastatin (LIPITOR) tablet 80 mg, 80 mg, Oral, q1800, Buford Dresser, MD, 80 mg at 06/07/19 1647 .  chlorhexidine (PERIDEX) 0.12 % solution 15 mL, 15 mL, Mouth Rinse, BID, Buford Dresser, MD, 15 mL at 06/08/19 0816 .  Chlorhexidine Gluconate Cloth 2 % PADS 6 each, 6 each, Topical, Daily, Buford Dresser, MD, 6 each at 06/08/19 0817 .  feeding supplement (ENSURE  ENLIVE) (ENSURE ENLIVE) liquid 237 mL, 237 mL, Oral, BID BM, Buford Dresser, MD, 237 mL at 06/08/19 1155 .  insulin aspart (novoLOG) injection 0-15 Units, 0-15 Units, Subcutaneous, Q4H, Buford Dresser, MD, 2 Units at 06/08/19 1200 .  living well with diabetes book MISC, , Does not apply, Once, Buford Dresser, MD, Stopped at 06/06/19 1119 .  MEDLINE mouth rinse, 15 mL, Mouth Rinse, q12n4p, Buford Dresser, MD, 15 mL at 06/08/19 1155 .  phenol (CHLORASEPTIC) mouth spray 1 spray, 1 spray, Mouth/Throat, PRN, Buford Dresser, MD, 1 spray at 06/04/19 1716 .  ticagrelor (BRILINTA) tablet 90 mg, 90 mg, Oral, BID, Buford Dresser, MD, 90 mg at 06/08/19 1154   Patients Current Diet:     Diet Order                      DIET DYS 3 Room service appropriate? Yes; Fluid consistency: Thin  Diet effective now                   Precautions / Restrictions Precautions Precautions: Fall, Other (comment) Precaution Comments: Inattention of left Restrictions Weight Bearing Restrictions: No    Has the patient had 2 or more falls or a fall with injury in the past year?No   Prior Activity Level Community (5-7x/wk): pt was working, driving, no DME used for mobility or ADLs   Prior Functional Level Prior Function Level of Independence: Independent Comments: ADLs, IADLs, and works in Architect.   Self Care: Did the patient need help bathing, dressing, using the toilet or eating?  Independent   Indoor Mobility: Did the patient need assistance with walking from room to room (with or without device)? Independent   Stairs: Did the patient need assistance with internal or external stairs (with or without device)? Independent   Functional Cognition: Did the patient need help planning regular tasks such as shopping or remembering to take medications? Independent   Home Assistive Devices / Equipment Home Assistive Devices/Equipment: Dentures (specify  type)(upper) Home Equipment: None   Prior Device Use: Indicate devices/aids used by the patient prior to current illness, exacerbation or injury? None of the above   Current Functional Level Cognition   Arousal/Alertness: Lethargic Overall Cognitive Status: Impaired/Different from baseline Current Attention Level: Alternating Orientation  Level: Oriented X4 Following Commands: Follows one step commands consistently, Follows multi-step commands inconsistently Safety/Judgement: Decreased awareness of safety, Decreased awareness of deficits General Comments: Pt continues to be impulsive and poor insight into deficits and inappropriate comments at times.  Required mod cues to attend to L side during gait. Attention: Sustained Sustained Attention: Impaired Sustained Attention Impairment: Verbal complex, Functional basic Memory: Impaired Memory Impairment: Storage deficit, Decreased recall of new information, Decreased short term memory Decreased Short Term Memory: Verbal complex, Functional basic Awareness: Impaired Awareness Impairment: Intellectual impairment, Emergent impairment, Anticipatory impairment Problem Solving: Impaired Problem Solving Impairment: Verbal complex, Functional basic Executive Function: Reasoning, Initiating, Self Correcting Reasoning: Impaired Reasoning Impairment: Verbal complex, Functional basic Initiating: Impaired Initiating Impairment: Verbal complex, Functional basic Self Correcting: Impaired Self Correcting Impairment: Verbal complex, Functional basic    Extremity Assessment (includes Sensation/Coordination)   Upper Extremity Assessment: LUE deficits/detail LUE Deficits / Details: Left sided inattention. ROM WFL. Improved coordination noted this session with pt able to reach for various sized objects on tray table with left hand. Noted ~2 drops throughout.  LUE Coordination: decreased fine motor, decreased gross motor  Lower Extremity Assessment: Defer  to PT evaluation LLE Deficits / Details: inattention to left hemi-body; no buckling in standing or gait     ADLs   Overall ADL's : Needs assistance/impaired Eating/Feeding: Supervision/ safety Eating/Feeding Details (indicate cue type and reason): Pt able to reach for drink and bring to mouth with RUE.  Grooming: Sitting, Minimal assistance, Min guard, Wash/dry face Grooming Details (indicate cue type and reason): Pt wiping his mouth with washclothe in LUE and noting decreased coorindation, maintaining grasp, and dropping to clothe as soon as finished with poor attention to LUE Upper Body Bathing: Minimal assistance, Sitting Lower Body Bathing: Moderate assistance, Sit to/from stand, Maximal assistance, +2 for physical assistance, +2 for safety/equipment Upper Body Dressing : Minimal assistance, Sitting Upper Body Dressing Details (indicate cue type and reason): Overshooting to right with reach RUE into sleeve. Unable to correct with VCs and requiring Min A.  Lower Body Dressing: Maximal assistance, +2 for physical assistance, +2 for safety/equipment, Sit to/from stand Lower Body Dressing Details (indicate cue type and reason): Pt able to sequence donning of socks and requring Max A for don while sitting at EOB.  Toilet Transfer: Moderate assistance, Maximal assistance, +2 for physical assistance, +2 for safety/equipment, Ambulation Functional mobility during ADLs: Moderate assistance, Maximal assistance, +2 for physical assistance, +2 for safety/equipment General ADL Comments: Worked on visual scanning task with familiar daily items while seated EOB. Noted improved visual scanning to the left at end of session.      Mobility   Overal bed mobility: Needs Assistance Bed Mobility: Supine to Sit, Sit to Supine Rolling: Supervision Supine to sit: Supervision Sit to supine: Supervision General bed mobility comments: cues for safety     Transfers   Overall transfer level: Needs  assistance Equipment used: None Transfers: Sit to/from Stand Sit to Stand: Supervision General transfer comment: cues for safety     Ambulation / Gait / Stairs / Wheelchair Mobility   Ambulation/Gait Ambulation/Gait assistance: Counsellor (Feet): 150 Feet Assistive device: None Gait Pattern/deviations: Step-through pattern, Drifts right/left General Gait Details: Pt demonstrating impulsivity with gait requiring cues for safety.  Required cues to look left to avoid objects in tight areas.  Demonstrating poor safety with 3 LOB due to decreased L foot clearance (cued to correct) but recovered independenlty.  Pt was able to state room number  and go in correct direction but was unable to find his room on left- passed room and still unable to find on right when he turned around. Gait velocity: slowed Gait velocity interpretation: 1.31 - 2.62 ft/sec, indicative of limited community ambulator Stairs: Yes Stairs assistance: Min guard Stair Management: Two rails, Step to pattern Number of Stairs: 4 General stair comments: cues for safety     Posture / Balance Dynamic Sitting Balance Sitting balance - Comments: supervision due to impulsivity Balance Overall balance assessment: Needs assistance Sitting-balance support: No upper extremity supported, Feet supported Sitting balance-Leahy Scale: Good Sitting balance - Comments: supervision due to impulsivity Standing balance support: No upper extremity supported, During functional activity Standing balance-Leahy Scale: Poor Standing balance comment: minA Standardized Balance Assessment Standardized Balance Assessment : Berg Balance Test Berg Balance Test Sit to Stand: Able to stand  independently using hands Standing Unsupported: Able to stand 2 minutes with supervision Sitting with Back Unsupported but Feet Supported on Floor or Stool: Able to sit safely and securely 2 minutes Stand to Sit: Sits safely with minimal use of  hands Transfers: Able to transfer safely, minor use of hands Standing Unsupported with Eyes Closed: Able to stand 10 seconds with supervision Standing Ubsupported with Feet Together: Able to place feet together independently but unable to hold for 30 seconds From Standing, Reach Forward with Outstretched Arm: Reaches forward but needs supervision From Standing Position, Pick up Object from Floor: Unable to pick up shoe, but reaches 2-5 cm (1-2") from shoe and balances independently From Standing Position, Turn to Look Behind Over each Shoulder: Needs supervision when turning Turn 360 Degrees: Needs close supervision or verbal cueing Standing Unsupported, Alternately Place Feet on Step/Stool: Needs assistance to keep from falling or unable to try Standing Unsupported, One Foot in Front: Loses balance while stepping or standing Standing on One Leg: Able to lift leg independently and hold equal to or more than 3 seconds Total Score: 30     Special needs/care consideration BiPAP/CPAP no CPM no Continuous Drip IV no Dialysis no        Days n/a Life Vest no Oxygen no Special Bed no Trach Size no Wound Vac (area) no      Location n/a Skin incision to R groin                  Bowel mgmt: continent, though urgent Bladder mgmt: continent, though urgent Diabetic mgmt yes, new diabetic Behavioral consideration no Chemo/radiation no        Previous Home Environment (from acute therapy documentation) Living Arrangements: Alone(Simultaneous filing. User may not have seen previous data.) Available Help at Discharge: Family, Available 24 hours/day Type of Home: House Home Layout: One level Home Access: Stairs to enter [employer] of Steps: 2 Bathroom Shower/Tub: Chiropodist: [place]: No   Discharge Living Setting Plans for Discharge Living Setting: Patient's home Type of Home at Discharge: House Discharge Home Layout: One level Discharge  Home Access: Stairs to enter Entrance Stairs-Rails: None(can install rails if needed) Entrance Stairs-Number of Steps: 2 Discharge Bathroom Shower/Tub: Tub/shower unit Discharge Bathroom Toilet: Standard Discharge Bathroom Accessibility: Yes How Accessible: Accessible via walker Does the patient have any problems obtaining your medications?: Yes (Describe)(uninsured)   Social/Family/Support Systems Anticipated Caregiver: daughter [name]), son Barbaraann Share)  Anticipated Caregiver's Contact Information: [name] [phone], Barbaraann Share [phone] Ability/Limitations of Caregiver: both work, but have been rotating to give 24/7 assist in hospital, aware that pt will need 24/7 at  home as well Caregiver Availability: 24/7 Discharge Plan Discussed with Primary Caregiver: Yes Is Caregiver In Agreement with Plan?: Yes Does Caregiver/Family have Issues with Lodging/Transportation while Pt is in Rehab?: No     Goals/Additional Needs Patient/Family Goal for Rehab: PT/OT/SLP supervision to mod I Expected length of stay: 9-12 days Dietary Needs: dys 3 /thin Additional Information: Pt uninsured, recommended Brilinta for anticoagulation, which is expensive, will need to discuss with Cards if needed to change prior to d/c; also did not get loop recorder 2/2 uninsured, will need outpatient cardiology follow up for monitor Pt/Family Agrees to Admission and willing to participate: Yes Program Orientation Provided & Reviewed with Pt/Caregiver Including Roles  & Responsibilities: Yes  Barriers to Discharge: Other (comments)(cost of medications and follow up)     Decrease burden of Care through IP rehab admission: n/a     Possible need for SNF placement upon discharge: Not anticipated. Family aware of need for 24/7 at discharge.      Patient Condition: This patient's medical and functional status has changed since the consult dated: 06/06/2019 in which the Rehabilitation Physician determined and documented  that the patient's condition is appropriate for intensive rehabilitative care in an inpatient rehabilitation facility. See "History of Present Illness" (above) for medical update. Functional changes are: min assist with mobility, BERG score 30/56. Patient's medical and functional status update has been discussed with the Rehabilitation physician and patient remains appropriate for inpatient rehabilitation. Will admit to inpatient rehab today.   Preadmission Screen Completed By:  Michel Santee, PT, DPT 06/08/2019 4:26 PM ______________________________________________________________________   Discussed status with Dr. Letta Pate on 06/08/19 at 4:34 PM  and received approval for admission today.   Admission Coordinator:  Michel Santee, PT, DPT  Time 4:34 Heidi Dach Sudie Grumbling 06/08/19

## 2019-06-11 NOTE — Progress Notes (Signed)
Occupational Therapy Session Note  Patient Details  Name: Scott Day MRN: [id number] Date of Birth: 05/22/1960  Today's Date: 06/11/2019 OT Individual Time: 0900-1020 and 1130-1200 OT Individual Time Calculation (min): 80 min and 30 min   Short Term Goals: Week 1:  OT Short Term Goal 1 (Week 1): STGs=LTGs due to ELOS  Skilled Therapeutic Interventions/Progress Updates:    Visit 1: pain: no c/o pain Pt received in room with daughter in the room. Pt sat to EOB and worked on visual tracking exercises to L visual field and L to R tracking with reading. Improved ocular ROM but tends to maintain R gaze preference. Sit to stand without UE support 5 x 2 with S. Standing balance with overhead reaching.   Pt then ambulated around room to gather clothing and supplies with mod verbal cues to maintain focus on task as he is frequently distracted talking about numerous things.  Ambulation with close S.  Pt then completed toileting, bathing and dressing with S only, min cues to stay on task with dressing.  Completed oral care standing at sink.  Ambulated 300 feet with close S and cues to not turn to look at things behind him as he is walking but to stop  And then look for increased safety.   Played connect four in standing with large game. Pt did well following directions, moving at a quick pace and picking up items off the floor.   Pt ambulated to room with S and rested on bed.   Because pt is S, and his daughter practiced ambulating with him this session, she has been cleared to walk him to the bathroom.   Education with daughter on attention, impulsivity, visual scanning.     Visit 2: Pain: No c/o pain  Pt ready for therapy.  Pt and dtr walked to ADL apt and pt practiced stepping in and out of bathtub with S using the wall for support. He then ambulated to day room to engage in a cornhole game. Pt had no LOB as he worked on Dietitian and weight shifts forward to toss the bags.  Pt  walked over to game to pick up all the bags off the floor using good reaching technique.  Pt appears to have a slight L visual field cut but on testing pt was able to see the target at a 90 degree angle.    Pt ambulated back to room and opted to sit in recliner. Belt alarm on and all needs met.    Therapy Documentation Precautions:  Precautions Precautions: Fall Precaution Comments: safety, impulsive. Restrictions Weight Bearing Restrictions: No   ADL: ADL Eating: Not assessed Grooming: Supervision/safety Where Assessed-Grooming: Standing at sink Upper Body Bathing: Supervision/safety Where Assessed-Upper Body Bathing: Edge of bed Lower Body Bathing: Contact guard Where Assessed-Lower Body Bathing: Edge of bed Upper Body Dressing: Supervision/safety Where Assessed-Upper Body Dressing: Edge of bed Lower Body Dressing: Contact guard Where Assessed-Lower Body Dressing: Edge of bed Toileting: Contact guard Where Assessed-Toileting: Glass blower/designer: Therapist, music Method: Ambulating(no AD) Gaffer Transfer: Curator Method: Ambulating(no AD)  Therapy/Group: Individual Therapy  [place] 06/11/2019, 8:43 AM

## 2019-06-11 NOTE — IPOC Note (Signed)
Individualized overall Plan of Care [facility]) Patient Details Name: Scott Day MRN: [id number] DOB: Sep 20, 1960  Admitting Diagnosis: Acute ischemic cerebrovascular accident (CVA) involving right middle cerebral artery territory [facility])  Hospital Problems: Principal Problem:   Acute ischemic cerebrovascular accident (CVA) involving right middle cerebral artery territory [facility]) Active Problems:   Right middle cerebral artery stroke (HCC)   Dysphagia, post-stroke   E. coli UTI   Diabetes mellitus, new onset ([place])     Functional Problem List: Nursing Bowel, Endurance, Medication Management, Sensory, Safety  PT Balance, Safety, Motor  OT Balance, Behavior, Safety, Cognition, Endurance, Motor  SLP Safety, Cognition  TR         Basic ADL's: OT Grooming, Bathing, Dressing, Toileting     Advanced  ADL's: OT Simple Meal Preparation, Laundry     Transfers: PT Bed Mobility, Bed to Chair, Car, [employer], Futures trader, Tub/Shower     Locomotion: PT Ambulation, Stairs     Additional Impairments: OT None  SLP        TR      Anticipated Outcomes Item Anticipated Outcome  Self Feeding No goal  Swallowing  Mod I   Basic self-care  Supervision/cuing  Toileting  Supervision/cuing   Bathroom Transfers Supervision/cuing  Bowel/Bladder  cont x 2, min assist  Transfers  Mod I from all surfaces, including floor.  Locomotion  Supervision w/o AD 2/2 impulsive behavior and mild left inattention, appears to have improved.  Communication     Cognition  Mod I  Pain  less than 3   Safety/Judgment  supervision assist   Therapy Plan: PT Intensity: Minimum of 1-2 x/day ,45 to 90 minutes PT Duration Estimated Length of Stay: 7 days OT Intensity: Minimum of 1-2 x/day, 45 to 90 minutes OT Frequency: 5 out of 7 days OT Duration/Estimated Length of Stay: 7-10 days SLP Intensity: Minumum of 1-2 x/day, 30 to 90 minutes SLP Frequency: 3 to 5 out of 7 days SLP Duration/Estimated  Length of Stay: 9-12 days    Team Interventions: Nursing Interventions Patient/Family Education, Bowel Management, Disease Management/Prevention, Medication Management, Cognitive Remediation/Compensation, Discharge Planning  PT interventions Ambulation/gait training, Discharge planning, DME/adaptive equipment instruction, Functional mobility training, Therapeutic Activities, UE/LE Strength taining/ROM, Training and development officer, Community reintegration, Neuromuscular re-education, Barrister's clerk education, IT trainer, Therapeutic Exercise, UE/LE Coordination activities  OT Interventions Training and development officer, Discharge planning, Pain management, Self Care/advanced ADL retraining, Therapeutic Activities, UE/LE Coordination activities, Therapeutic Exercise, Patient/family education, Functional mobility training, Disease mangement/prevention, Cognitive remediation/compensation, DME/adaptive equipment instruction, Neuromuscular re-education, Psychosocial support, UE/LE Strength taining/ROM, Community reintegration  SLP Interventions Cognitive remediation/compensation, Cueing hierarchy, Functional tasks, Therapeutic Activities, Therapeutic Exercise, Dysphagia/aspiration precaution training, Medication managment, Patient/family education, Internal/external aids  TR Interventions    SW/CM Interventions     Barriers to Discharge MD  Medical stability and New diagnosis diabetes mellitus type 2  Nursing      PT Lack of/limited family support safety issues.  OT Medical stability    SLP      SW       Team Discharge Planning: Destination: PT-Home ,OT- Home , SLP-Home Projected Follow-up: PT-Outpatient PT, OT-  24 hour supervision/assistance, SLP-Home Health SLP Projected Equipment Needs: PT-To be determined, OT- To be determined, SLP-None recommended by SLP Equipment Details: PT- , OT-  Patient/family involved in discharge planning: PT- Patient, Family member/caregiver,  OT-Patient, Family  member/caregiver, SLP-Patient  MD ELOS: 5-7 days. Medical Rehab Prognosis:  Good Assessment: 59 year old right-handed male with history of colon cancer in 2012, GERD, tobacco abuse.  Presented 06/04/2018 while left-sided weakness and slurred speech. Admission chemistries unremarkable except glucose 213, sodium 133, alcohol negative. Cranial CT scan negative for acute changes. 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 Patient did not receive TPA. Underwent revascularization stent placement per interventional radiology DR [place],. 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 Echocardiogram with ejection fraction of 70% without emboli. Lower extremity Dopplers negative for DVT. Currently maintained on aspirin as well as Brilinta for CVA prophylaxis. Close monitoring of blood pressure initially on Cardene. TEEshowing ejection fraction of 65% without thrombusand underwent loop recorder placement. Findings of elevated hemoglobin A1c of 8.3 and placed on sliding scale insulin. Dysphagia #3 thin liquid diet. UA with ?UTI.  Patient with resulting functional deficits with mobility, transfers, self-care, swallowing, cognition.  Will set goals for supervision with PT/OT/SLP.  Due to the current state of emergency, patients may not be receiving their 3-hours of Medicare-mandated therapy.  See Team Conference Notes for weekly updates to the plan of care

## 2019-06-11 NOTE — Progress Notes (Signed)
[place] PHYSICAL MEDICINE & REHABILITATION PROGRESS NOTE   Subjective/Complaints: Patient seen sitting up in bed this morning, working with therapies.  He states he slept very well overnight.  He states he had a good first day of therapies yesterday.  ROS: Denies CP, SOB N/V/D  Objective:   No results found. Recent Labs    06/09/19 0432 06/11/19 0543  WBC 13.4* 10.9*  HGB 15.0 15.1  HCT 43.1 44.5  PLT 212 236   Recent Labs    06/09/19 0432 06/11/19 0543  NA 137 137  K 3.3* 3.8  CL 105 101  CO2 19* 25  GLUCOSE 128* 133*  BUN 8 10  CREATININE 0.85 1.07  CALCIUM 9.0 9.0    Intake/Output Summary (Last 24 hours) at 06/11/2019 1253 Last data filed at 06/11/2019 0902 Gross per 24 hour  Intake 530 ml  Output -  Net 530 ml     Physical Exam: Vital Signs Blood pressure 119/64, pulse 73, temperature 98.2 F (36.8 C), temperature source Oral, resp. rate 16, SpO2 96 %. Constitutional: No distress . Vital signs reviewed. HENT: Normocephalic.  Atraumatic. Eyes: EOMI. No discharge. Cardiovascular: No JVD. Respiratory: Normal effort.  No stridor. GI: Non-distended. Skin: Warm and dry.  Intact. Psych: Normal mood.  Normal behavior. Musc: No edema in extremities.  No tenderness in extremities. Neurologic: Alert Motor: RUE/RLE: 5/5 proximal distal LUE/LLE: 4+-5/5 proximal distal No ataxia  Assessment/Plan: 1. 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  Physiatrist is providing close team supervision and 24 hour management of active medical problems listed below.  Physiatrist and rehab team continue to assess barriers to discharge/monitor patient progress toward functional and medical goals  Care Tool:  Bathing    Body parts bathed by patient: Right arm, Left arm, Chest, Abdomen, Front perineal area, Buttocks, Right upper leg, Left upper leg, Right  lower leg, Left lower leg, Face         Bathing assist Assist Level: Supervision/Verbal cueing     Upper Body Dressing/Undressing Upper body dressing   What is the patient wearing?: Pull over shirt    Upper body assist Assist Level: Supervision/Verbal cueing    Lower Body Dressing/Undressing Lower body dressing      What is the patient wearing?: Underwear/pull up, Pants     Lower body assist Assist for lower body dressing: Supervision/Verbal cueing     Toileting Toileting    Toileting assist Assist for toileting: Supervision/Verbal cueing     Transfers Chair/bed transfer  Transfers assist     Chair/bed transfer assist level: Supervision/Verbal cueing     Locomotion Ambulation   Ambulation assist      Assist level: Contact Guard/Touching assist Assistive device: No Device Max distance: 150   Walk 10 feet activity   Assist     Assist level: Contact Guard/Touching assist Assistive device: No Device   Walk 50 feet activity   Assist    Assist level: Contact Guard/Touching assist Assistive device: No Device    Walk 150 feet activity   Assist    Assist level: Contact Guard/Touching assist Assistive device: No Device    Walk 10 feet on uneven surface  activity   Assist     Assist level: Contact Guard/Touching assist Assistive device: Other (comment)(no device on mulch.)   Wheelchair     Assist Will patient use wheelchair at discharge?: No Type of Wheelchair: Manual Wheelchair  activity did not occur: N/A         Wheelchair 50 feet with 2 turns activity    Assist    Wheelchair 50 feet with 2 turns activity did not occur: N/A       Wheelchair 150 feet activity     Assist  Wheelchair 150 feet activity did not occur: N/A       Blood pressure 119/64, pulse 73, temperature 98.2 F (36.8 C), temperature source Oral, resp. rate 16, SpO2 96 %.    Medical Problem List and Plan: 1.Left-sided weakness with  dysarthriasecondary to right MCA territory infarction with right M1 distal occlusion status post stenting. Status post loop recorder  Continue CIR 2. Antithrombotics: -DVT/anticoagulation:SCDs.Lower extremity Dopplers negative. -antiplatelet therapy: Aspirin 81 mg daily, Brilinta 90 mg twice daily 3. Pain Management:Tylenol as needed 4. Mood:Provide emotional support -antipsychotic agents: N/A 5. Neuropsych: This patientiscapable of making decisions on hisown behalf. 6. Skin/Wound Care:Routine skin checks 7. Fluids/Electrolytes/Nutrition:Routine in and outs 8. Hypertension. On no antihypertensive medication prior to admission. Monitor with increased mobility  Controlled on 3/22 9. Hyperlipidemia. Lipitor 10. New findings of diabetes mellitus. Hemoglobin A1c 8.3. SSI. Diabetic teaching CBG (last 3)  Recent Labs    06/11/19 0424 06/11/19 0801 06/11/19 1216  GLUCAP 131* 130* 110*   Metformin 250 daily started 3/22 11.  E. coli UTI  Sensitivities pending  Empiric Macrobid started on 3/22 12.  Post stroke dysphagia  D3 thins, advance diet as tolerated  LOS: 2 days A FACE TO FACE EVALUATION WAS PERFORMED   Lorie Phenix 06/11/2019, 12:53 PM

## 2019-06-11 NOTE — Progress Notes (Signed)
Physical Medicine and Rehabilitation Consult Reason for Consult: Left side weakness Referring Physician: Dr.Xu     HPI: Scott Day is a 59 y.o. right-handed male with history of colon cancer in 2012, GERD, tobacco abuse.  Per chart review lives alone independent prior to admission 1 level home 2 steps to entry.  Presented 06/04/2019 with left-sided weakness.  Cranial CT scan negative for acute changes.  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  Patient did not receive TPA.  Underwent revascularization stent placement per interventional radiology.  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  Echocardiogram with ejection fraction of 70% without emboli.  Lower extremity Dopplers pending.  Admission chemistries unremarkable except glucose 213, sodium 133, urine drug screen negative.  Currently maintained on aspirin and Brilinta for CVA prophylaxis.  Close monitoring of blood pressure with Cardene.  Therapy evaluations completed with recommendations of physical medicine rehab consult.     Review of Systems  Constitutional: Negative for chills and fever.  HENT: Negative for hearing loss.   Eyes: Negative for blurred vision and double vision.  Respiratory: Negative for cough and shortness of breath.   Cardiovascular: Negative for chest pain, palpitations and leg swelling.  Gastrointestinal: Positive for constipation. Negative for heartburn, nausea and vomiting.  Genitourinary: Negative for dysuria, flank pain and hematuria.  Musculoskeletal: Positive for myalgias.  Skin: Negative for rash.  Neurological: Positive for speech change and focal weakness.  All other systems reviewed and are negative.       Past Medical History:  Diagnosis Date  . Cancer [facility]) 2012    colon cancer  . Closed fracture of left distal radius    . GERD (gastroesophageal reflux disease)           Past  Surgical History:  Procedure Laterality Date  . COLON SURGERY   2012    colon cancer  . IR CT HEAD LTD   06/04/2019  . IR CT HEAD LTD   06/04/2019  . IR INTRA CRAN STENT   06/04/2019  . IR PERCUTANEOUS ART THROMBECTOMY/INFUSION INTRACRANIAL INC DIAG ANGIO   06/04/2019  . IR US GUIDE VASC ACCESS RIGHT   06/04/2019  . OPEN REDUCTION INTERNAL FIXATION (ORIF) DISTAL RADIAL FRACTURE Left 09/20/2017    Procedure: OPEN REDUCTION INTERNAL FIXATION (ORIF) DISTAL RADIAL FRACTURE;  Surgeon: Leanora Cover, MD;  Location: [place];  Service: Orthopedics;  Laterality: Left;  . RADIOLOGY WITH ANESTHESIA N/A 06/04/2019    Procedure: IR WITH ANESTHESIA;  Surgeon: Radiologist, Medication, MD;  Location: [place];  Service: Radiology;  Laterality: N/A;    History reviewed. No pertinent family history. Social History:  reports that he has quit smoking. He has never used smokeless tobacco. He reports current alcohol use. He reports that he does not use drugs. Allergies: No Known Allergies       Medications Prior to Admission  Medication Sig Dispense Refill  . HYDROcodone-acetaminophen (NORCO) 5-325 MG tablet 1-2 tabs po q6 hours prn pain (Patient not taking: Reported on 06/04/2019) 30 tablet 0      Home: Home Living Family/patient expects to be discharged to:: Private residence Living Arrangements: Alone(Simultaneous filing. User may not have seen previous data.) Available Help at Discharge: Family, Available 24 hours/day Type of Home: House Home Access: Stairs to enter [employer] of Steps: 2 Home Layout: One level Bathroom Shower/Tub: Chiropodist:  Standard Home Equipment: None  Functional History: Prior Function Level of Independence: Independent Comments: ADLs, IADLs, and works in [employer]. Functional Status:  Mobility: Bed Mobility Overal bed mobility: Needs Assistance Bed Mobility: Supine to Sit, Sit to Supine Supine to sit: Min assist, HOB  elevated Sit to supine: Min guard General bed mobility comments: Min A for elevating trunk. Min guard A for safety in returning to supine Transfers Overall transfer level: Needs assistance Equipment used: 2 person hand held assist Transfers: Sit to/from Stand Sit to Stand: +2 physical assistance, +2 safety/equipment, Min assist, Mod assist General transfer comment: Min A +2 to power up into standing and then Mod A +2 for correction of left lateral lean Ambulation/Gait Ambulation/Gait assistance: Mod assist, +2 physical assistance Gait Distance (Feet): 25 Feet Assistive device: 2 person hand held assist Gait Pattern/deviations: Step-through pattern, Decreased stride length, Decreased weight shift to right, Staggering left, Drifts right/left General Gait Details: decr attn to left environment with running into wall; left lean   ADL: ADL Overall ADL's : Needs assistance/impaired Eating/Feeding: NPO Grooming: Sitting, Minimal assistance, Min guard, Wash/dry face Grooming Details (indicate cue type and reason): Pt wiping his mouth with washclothe in LUE and noting decreased coorindation, maintaining grasp, and dropping to clothe as soon as finished with poor attention to LUE Upper Body Bathing: Minimal assistance, Sitting Lower Body Bathing: Moderate assistance, Sit to/from stand, Maximal assistance, +2 for physical assistance, +2 for safety/equipment Upper Body Dressing : Minimal assistance, Sitting Upper Body Dressing Details (indicate cue type and reason): Overshooting to right with reach RUE into sleeve. Unable to correct with VCs and requiring Min A.  Lower Body Dressing: Maximal assistance, +2 for physical assistance, +2 for safety/equipment, Sit to/from stand Lower Body Dressing Details (indicate cue type and reason): Pt able to sequence donning of socks and requring Max A for don while sitting at EOB.  Toilet Transfer: Moderate assistance, Maximal assistance, +2 for physical  assistance, +2 for safety/equipment, Ambulation Functional mobility during ADLs: Moderate assistance, Maximal assistance, +2 for physical assistance, +2 for safety/equipment General ADL Comments: Pt presenting with inattention to left, poor balance, and visual deficits impacting his performance of ADLs. Highly distracted and poor awareness of deficits.    Cognition: Cognition Overall Cognitive Status: Impaired/Different from baseline Arousal/Alertness: Lethargic Orientation Level: Oriented X4 Attention: Sustained Sustained Attention: Impaired Sustained Attention Impairment: Verbal complex, Functional basic Memory: Impaired Memory Impairment: Storage deficit, Decreased recall of new information, Decreased short term memory Decreased Short Term Memory: Verbal complex, Functional basic Awareness: Impaired Awareness Impairment: Intellectual impairment, Emergent impairment, Anticipatory impairment Problem Solving: Impaired Problem Solving Impairment: Verbal complex, Functional basic Executive Function: Reasoning, Initiating, Self Correcting Reasoning: Impaired Reasoning Impairment: Verbal complex, Functional basic Initiating: Impaired Initiating Impairment: Verbal complex, Functional basic Self Correcting: Impaired Self Correcting Impairment: Verbal complex, Functional basic Cognition Arousal/Alertness: Awake/alert Behavior During Therapy: Impulsive Overall Cognitive Status: Impaired/Different from baseline Area of Impairment: Attention, Following commands, Safety/judgement, Memory, Problem solving, Awareness Current Attention Level: Sustained Memory: Decreased recall of precautions, Decreased short-term memory Following Commands: Follows one step commands inconsistently, Follows one step commands with increased time Safety/Judgement: Decreased awareness of safety, Decreased awareness of deficits Awareness: Intellectual Problem Solving: Slow processing, Difficulty sequencing, Requires  verbal cues, Requires tactile cues General Comments: Pt able to state he is in the hospital for a "stroke, so they say." When asked what symptoms he had from the stroke he denied weakness, vision changes, or balance deficits (even at end of session after mobility required 2  person assist). Pt with poor awareness of his deficits and requiring Mod-Max cues to bring awareness of left side (body and enviroment). Pt quickly distracted and benefiting from calm and quiet environment.   Blood pressure (!) 147/99, pulse 80, temperature 98.5 F (36.9 C), temperature source Oral, resp. rate 17, height 5' 11"  (1.803 m), weight 105 kg, SpO2 96 %.   Physical Exam  General: Alert and oriented x 2, No apparent distress. HEENT: Head is normocephalic, atraumatic, PERRLA, EOMI, sclera anicteric, oral mucosa pink and moist, dentition intact, ext ear canals clear,  Neck: Supple without JVD or lymphadenopathy Heart: Reg rate and rhythm. No murmurs rubs or gallops Chest: CTA bilaterally without wheezes, rales, or rhonchi; no distress Abdomen: Soft, non-tender, non-distended, bowel sounds positive. Extremities: No clubbing, cyanosis, or edema. Pulses are 2+ Skin: Clean and intact without signs of breakdown Neuro: Patient is lethargic but arousable.  Speech is a bit dysarthric but intelligible. Mild left facial droop. Right gaze preference.Provides his name, place, and month but had difficulty with year and month.  Follows simple commands.  MSK: Tenderness to palpation over right shoulder joint. 5/5 strength throughout with exception of R shoulder abduction 4/5 and left wrist extension (4/5)- latter from prior injury.  Psych: Pt's affect is appropriate. Pt is cooperative   Lab Results Last 24 Hours       Results for orders placed or performed during the hospital encounter of 06/04/19 (from the past 24 hour(s))  Glucose, capillary     Status: Abnormal    Collection Time: 06/05/19  9:51 AM  Result Value Ref Range     Glucose-Capillary 159 (H) 70 - 99 mg/dL  Glucose, capillary     Status: Abnormal    Collection Time: 06/05/19 11:24 AM  Result Value Ref Range    Glucose-Capillary 129 (H) 70 - 99 mg/dL  Rapid urine drug screen (hospital performed)     Status: None    Collection Time: 06/05/19  7:26 PM  Result Value Ref Range    Opiates NONE DETECTED NONE DETECTED    Cocaine NONE DETECTED NONE DETECTED    Benzodiazepines NONE DETECTED NONE DETECTED    Amphetamines NONE DETECTED NONE DETECTED    Tetrahydrocannabinol NONE DETECTED NONE DETECTED    Barbiturates NONE DETECTED NONE DETECTED  Glucose, capillary     Status: Abnormal    Collection Time: 06/06/19 12:05 AM  Result Value Ref Range    Glucose-Capillary 101 (H) 70 - 99 mg/dL    Comment 1 Notify RN    Glucose, capillary     Status: Abnormal    Collection Time: 06/06/19  4:22 AM  Result Value Ref Range    Glucose-Capillary 140 (H) 70 - 99 mg/dL    Comment 1 Notify RN         Imaging Results (Last 48 hours)  CT ANGIO HEAD W OR WO CONTRAST   Addendum Date: 06/04/2019   ADDENDUM REPORT: 06/04/2019 12:21 ADDENDUM: Contrast dose is 100 mL Omnipaque 350 Electronically Signed   By: Macy Mis M.D.   On: 06/04/2019 12:21    Result Date: 06/04/2019 CLINICAL DATA:  Code stroke.  Slurred speech and left facial droop EXAM: CT ANGIOGRAPHY HEAD AND NECK CT PERFUSION BRAIN TECHNIQUE: Multidetector CT imaging of the head and neck was performed using the standard protocol during bolus administration of intravenous contrast. Multiplanar CT image reconstructions and MIPs were obtained to evaluate the vascular anatomy. Carotid stenosis measurements (when applicable) are obtained utilizing NASCET criteria, using the  distal internal carotid diameter as the denominator. Multiphase CT imaging of the brain was performed following IV bolus contrast injection. Subsequent parametric perfusion maps were calculated using RAPID software. COMPARISON:  None. FINDINGS: CT HEAD  Brain: There is no acute intracranial hemorrhage mass effect edema. Gray-white differentiation is preserved. Ventricles and sulci normal in size and configuration. There is no extra-axial fluid collection. Vascular: Hyperdense vessel. Skull: Unremarkable. Sinuses/Orbits: Lobular mucosal thickening with greatest involvement of the right maxillary sinus. No acute orbital finding. Other: Mastoid air cells are clear ASPECTS [facility] Stroke Program Early CT Score) - Ganglionic level infarction (caudate, lentiform nuclei, internal capsule, insula, M1-M3 cortex): 7 - Supraganglionic infarction (M4-M6 cortex): 3 Total score (0-10 with 10 being normal): 10 Review of the MIP images confirms the above findings CTA NECK Aortic arch: Great vessel origins are patent. Right carotid system: Patent. There is trace calcified plaque at the ICA origin measurable stenosis. Left carotid system: Patent. Eccentric noncalcified plaque causing less than 50% stenosis. Mild calcified plaque at the ICA origin causing less than 50% stenosis. Vertebral arteries: Patent.  Left vertebral artery dominant. Skeleton: Cervical spine degenerative changes. Other neck: No mass or adenopathy Upper chest: No apical lung mass. Review of the MIP images confirms the above findings CTA HEAD Anterior circulation: Intracranial internal carotid arteries patent. There is nonocclusive thrombus within distal right M1 MCA extending to the bifurcation. Left middle cerebral artery is patent. Anterior cerebral arteries are patent. Congenitally absent right A1 ACA. Posterior circulation: Intracranial vertebral arteries, basilar artery, and posterior cerebral arteries are patent. There are bilateral patent posterior communicating arteries. Venous sinuses: As permitted by contrast timing, patent. Review of the MIP images confirms the above findings CT Brain Perfusion: CBF (<30%) Volume: 40m Perfusion (Tmax>6.0s) volume: 15110mMismatch Volume: 12625mnfarction Location:  Right MCA territory IMPRESSION: No acute intracranial hemorrhage or evidence of acute infarction. ASPECT score is 10. 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 Perfusion imaging demonstrates 25 mL core infarction and 126 mL territory at risk in the right MCA territory. No hemodynamically significant stenosis in the neck. These results were called by telephone at the time of interpretation on 06/04/2019 at 11:53 am to provider JONSaint Luke InstituteLESAlexis Goodellwho verbally acknowledged these results. Electronically Signed: By: PraMacy MisD. On: 06/04/2019 11:59    DG Abd 1 View   Result Date: 06/05/2019 CLINICAL DATA:  New NG tube EXAM: ABDOMEN - 1 VIEW COMPARISON:  None. FINDINGS: Advancement of NG tube such that the tip is at the expected location of the junction of the second third portion the duodenum. IMPRESSION: NG tube extends into duodenum Electronically Signed   By: SteSuzy BouchardD.   On: 06/05/2019 14:28    DG Abd 1 View   Result Date: 06/04/2019 CLINICAL DATA:  Nasogastric tube placement. EXAM: ABDOMEN - 1 VIEW COMPARISON:  None. FINDINGS: A nasogastric tube is seen with its distal tip overlying the body of the stomach. This is approximately 7.8 cm distal to the expected region of the gastroesophageal junction. The bowel gas pattern is normal. No radio-opaque calculi or other significant radiographic abnormality are seen. IMPRESSION: Nasogastric tube positioning, as described above. Further advancement of the NG tube by approximately 5 cm is recommended to decrease the risk of aspiration. Electronically Signed   By: ThaVirgina NorfolkD.   On: 06/04/2019 20:23    CT HEAD WO CONTRAST   Result Date: 06/05/2019 CLINICAL DATA:  Follow-up stroke.  Right MCA territory infarctions. EXAM: CT HEAD WITHOUT CONTRAST TECHNIQUE: Contiguous axial images were obtained from the base of the skull through the vertex without intravenous contrast. COMPARISON:   Multiple CT studies over the last 2 days. FINDINGS: Brain: Patchy low-density in the right middle cerebral artery territory continues to become more evident by CT consistent with multiple infarctions in that region. These are notable in the insula, right lateral and posterior temporal lobe, right frontal operculum and right frontal parietal junction. No significant swelling, mass effect or any detectable hemorrhage. Right MCA stent as seen previously. Vascular: There is atherosclerotic calcification of the major vessels at the base of the brain. Right MCA stent as seen previously. Skull: Negative Sinuses/Orbits: Chronic sinusitis particularly affecting the right maxillary sinus. Orbits negative. Other: None IMPRESSION: Multiple infarctions in the right middle cerebral artery territory becoming more evident on CT, with increasing low density. No evidence of significant mass effect or any hemorrhage. Electronically Signed   By: Nelson Chimes M.D.   On: 06/05/2019 15:38    CT HEAD WO CONTRAST   Result Date: 06/04/2019 CLINICAL DATA:  Stroke, follow-up. EXAM: CT HEAD WITHOUT CONTRAST TECHNIQUE: Contiguous axial images were obtained from the base of the skull through the vertex without intravenous contrast. COMPARISON:  Noncontrast head CT and CT angiogram head/neck performed earlier the same day 06/04/2019 FINDINGS: Brain: There has been interval development of patchy acute cortical/subcortical infarction changes within the right MCA vascular territory. Findings are most notable within the right frontal operculum, right insula and within portions of the right temporoparietal junction (series 3, image 20) (series 3, image 18) (series 5, image 48). No evidence of hemorrhagic conversion. No significant mass effect. No midline shift. There is no hydrocephalus. No extra-axial fluid collection is identified. Vascular: A stent is now present in the region of the M1 right middle cerebral artery. Otherwise, no hyperdense  vessel is identified. Skull: Normal. Negative for fracture or focal lesion. Sinuses/Orbits: Visualized orbits demonstrate no acute abnormality. Moderate mucosal thickening and frothy secretions within the right maxillary sinus. Additional mild scattered paranasal sinus mucosal thickening. Left frontal sinus mucous retention cyst. No significant mastoid effusion. IMPRESSION: Interval development of patchy acute cortical/subcortical infarction changes within the right MCA vascular territory most notable within the right frontal operculum, right insula and portions of the right temporoparietal junction. No hemorrhagic conversion. No midline shift or significant mass effect. Paranasal sinus disease as described, most notably right maxillary. Electronically Signed   By: Kellie Simmering DO   On: 06/04/2019 18:43    CT ANGIO NECK W OR WO CONTRAST   Addendum Date: 06/04/2019   ADDENDUM REPORT: 06/04/2019 12:21 ADDENDUM: Contrast dose is 100 mL Omnipaque 350 Electronically Signed   By: Macy Mis M.D.   On: 06/04/2019 12:21    Result Date: 06/04/2019 CLINICAL DATA:  Code stroke.  Slurred speech and left facial droop EXAM: CT ANGIOGRAPHY HEAD AND NECK CT PERFUSION BRAIN TECHNIQUE: Multidetector CT imaging of the head and neck was performed using the standard protocol during bolus administration of intravenous contrast. Multiplanar CT image reconstructions and MIPs were obtained to evaluate the vascular anatomy. Carotid stenosis measurements (when applicable) are obtained utilizing NASCET criteria, using the distal internal carotid diameter as the denominator. Multiphase CT imaging of the brain was performed following IV bolus contrast injection. Subsequent parametric perfusion maps were calculated using RAPID software. COMPARISON:  None. FINDINGS: CT HEAD Brain: There is no acute intracranial hemorrhage mass effect edema. Gray-white differentiation is preserved. Ventricles and  sulci normal in size and configuration.  There is no extra-axial fluid collection. Vascular: Hyperdense vessel. Skull: Unremarkable. Sinuses/Orbits: Lobular mucosal thickening with greatest involvement of the right maxillary sinus. No acute orbital finding. Other: Mastoid air cells are clear ASPECTS [facility] Stroke Program Early CT Score) - Ganglionic level infarction (caudate, lentiform nuclei, internal capsule, insula, M1-M3 cortex): 7 - Supraganglionic infarction (M4-M6 cortex): 3 Total score (0-10 with 10 being normal): 10 Review of the MIP images confirms the above findings CTA NECK Aortic arch: Great vessel origins are patent. Right carotid system: Patent. There is trace calcified plaque at the ICA origin measurable stenosis. Left carotid system: Patent. Eccentric noncalcified plaque causing less than 50% stenosis. Mild calcified plaque at the ICA origin causing less than 50% stenosis. Vertebral arteries: Patent.  Left vertebral artery dominant. Skeleton: Cervical spine degenerative changes. Other neck: No mass or adenopathy Upper chest: No apical lung mass. Review of the MIP images confirms the above findings CTA HEAD Anterior circulation: Intracranial internal carotid arteries patent. There is nonocclusive thrombus within distal right M1 MCA extending to the bifurcation. Left middle cerebral artery is patent. Anterior cerebral arteries are patent. Congenitally absent right A1 ACA. Posterior circulation: Intracranial vertebral arteries, basilar artery, and posterior cerebral arteries are patent. There are bilateral patent posterior communicating arteries. Venous sinuses: As permitted by contrast timing, patent. Review of the MIP images confirms the above findings CT Brain Perfusion: CBF (<30%) Volume: 48m Perfusion (Tmax>6.0s) volume: 1552mMismatch Volume: 12665mnfarction Location: Right MCA territory IMPRESSION: No acute intracranial hemorrhage or evidence of acute infarction. ASPECT score is 10. Nonocclusive thrombus within the distal right M1  MCA extending to the bifurcation. Perfusion imaging demonstrates 25 mL core infarction and 126 mL territory at risk in the right MCA territory. No hemodynamically significant stenosis in the neck. These results were called by telephone at the time of interpretation on 06/04/2019 at 11:53 am to provider JONFranciscan St Elizabeth Health - [facility] CentralLESAlexis Goodellwho verbally acknowledged these results. Electronically Signed: By: PraMacy MisD. On: 06/04/2019 11:59    IR Intra Cran Stent   Result Date: 06/04/2019 CLINICAL DATA:  [age]ar old male with past medical history significant for colon cancer in 2012. He developed left-sided weakness and neglect on awakening this morning. [facility] showed small hypodensity in the right insula with no hemorrhage. NIHSS 10. CT angiogram showed tapering of the right M1/MCA with short-segment filling defect. CT perfusion showed an estimated core volume of 25 mL with a mismatch of 126 mL. He was transferred to our service for emergency treatment of the right M1/MCA occlusion. EXAM: IR CT HEAD LIMITED; IR PERCUTANEOUS ART THORMBECTOMY/INFUSION INTRACRANIAL INCLUDE DIAG ANGIO; IR ULTRASOUND GUIDANCE VASC ACCESS RIGHT; INTRACRANIAL STENT (INCL PTA) ANESTHESIA/SEDATION: General anesthesia. MEDICATIONS: Intra arterial Verapamil 5 mg; intra arterial Integrilin 19 mg. CONTRAST:  5mL48mNIPAQUE IOHEXOL 300 MG/ML SOLN, 75mL60mIPAQUE IOHEXOL 240 MG/ML SOLN PROCEDURE: Operator: Dr. KatyuPedro Earls No healthcare proxy or next of kin available for informed consent. Discussion held with the neurohospitalist attending. Agreement reached that the procedure would be in the best interest of the patient and benefits outweighed risks. The patient was made to lie supine on the angiography table. He was prepped and draped utilizing the usual sterile technique. Using gray scale ultrasound-guided, a micropuncture kit and modified Seldinger technique, access was  gained to the right common femoral artery. An 8 French femoral sheath was then placed. Under fluoroscopy, an 8 [place]  balloon guide catheter was navigated over a 6 [place] Berenstein 2 catheter and a 0.038 inch Terumo Glidewire into the aortic arch. Under fluoroscopy, the catheter was advanced into the right common carotid artery and then into the right internal carotid artery. Frontal and lateral angiograms of the head were obtained showing a distal right M1/MCA occlusion with minimal delayed penetration of contrast. A large bore aspiration catheter was then navigated through the walrus balloon guide catheter and over a phenom 21 microcatheter and a synchro support microguidewire into the cavernous segment of the right ICA. The microcatheter was then advanced over the microwire into a right M1/MCA middle division branch. A 6 mm solitaire stent retriever was deployed spanning the right M1/MCA and proximal M2/middle division branch. Device was allowed to intercalated with the clot for 4 minutes. The microcatheter was removed under fluoroscopy. The guiding catheter balloon was inflated at the level of the proximal petrous segment. Thrombectomy device and aspiration catheter were then removed under constant aspiration. Follow-up angiogram showed recanalization of the M1 segment with subocclusive filling defect and slow flow in distal MCA branches (TICI 2C). A large bore aspiration catheter was again navigated through the walrus balloon guide catheter and over a phenom 21 microcatheter and a synchro support microguidewire into the cavernous segment of the right ICA. The microcatheter was then advanced over the microwire into a right M1/MCA posterior division branch. A 6 mm solitaire stent retriever was deployed spanning the right M1/MCA and proximal M2 posterior division branch. Device was allowed to intercalated with the clot for 4 minutes. The microcatheter was removed under fluoroscopy. The guiding catheter  balloon was inflated at the level of the proximal petrous segment. Thrombectomy device and aspiration catheter were then removed under constant aspiration. Follow-up angiogram show normal forward flow into the right MCA vascular territory noting a small dissection flap in the mid/distal right M1/MCA segment. Delayed follow-up angiogram showed progressive reocclusion of the right M1/MCA with minimal forward flow. Flat panel CT of the head was obtained and post processed in a separate workstation under concurrent attending physician supervision. No evidence of hemorrhagic complication noted. Right ICA angiograms with frontal lateral views of the head were obtained. Further progression of occlusion in the right M1 segment was noted. Small non flow limiting iatrogenic dissection in the upper cervical segment of the right ICA identified. The large bore aspiration catheter was navigated through the walrus balloon guide catheter and over a SL 10 microcatheter and a synchro support microguidewire into the cavernous segment of the right ICA. The microcatheter was then advanced over the microwire into a right M1/MCA posterior division branch. Intra arterial infusion of 19 mg of Integrilin was performed into the right ICA. Subsequently, a 3 mm x 15 mm atlas intracranial stent was deployed in the M1 segment across the dissection flap. Follow-up angiogram showed adequate stent positioning with complete restoration of the anterograde flow in the right MCA. Delayed follow-up angiogram (x2) show no evidence of reocclusion. 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 Postprocedural flat panel CT of the head was obtained and post processed in a separate workstation under concurrent attending physician supervision. No evidence of hemorrhagic complication noted. The catheter system was withdrawn. A right common femoral artery angiogram was obtained showing access at the level of  the right common femoral artery. The femoral sheath was removed and the access was closed with a Perclose ProGlide. Immediate hemostasis achieved. COMPLICATIONS: Small non flow limiting dissection of  the upper cervical segment of the right ICA. FINDINGS: Dissection of the mid/distal right M1/MCA. IMPRESSION: 1. Mechanical thrombectomy performed (x2) with temporary restoration of flow revealing dissection flap in the mid/distal right M1/MCA segment. 2. Intracranial stenting placed in the right M1/MCA across the dissection flap with complete restoration of flow. 3. No hemorrhagic complication on postprocedural flat panel CT. No embolus to new territory. PLAN: Patient is to remain on Integrilin drip. A follow-up head CT will be obtained at 6:30 p.m. today. At this point, patient will be re-evaluated for transition to oral dual anti-platelet therapy. Electronically Signed   By: Pedro Earls M.D.   On: 06/04/2019 17:40    IR CT Head Ltd   Result Date: 06/04/2019 CLINICAL DATA:  59 year old male with past medical history significant for colon cancer in 2012. He developed left-sided weakness and neglect on awakening this morning. He was taken to [facility] hospital. Head CT showed small hypodensity in the right insula with no hemorrhage. NIHSS 10. CT angiogram showed tapering of the right M1/MCA with short-segment filling defect. CT perfusion showed an estimated core volume of 25 mL with a mismatch of 126 mL. He was transferred to our service for emergency treatment of the right M1/MCA occlusion. EXAM: IR CT HEAD LIMITED; IR PERCUTANEOUS ART THORMBECTOMY/INFUSION INTRACRANIAL INCLUDE DIAG ANGIO; IR ULTRASOUND GUIDANCE VASC ACCESS RIGHT; INTRACRANIAL STENT (INCL PTA) ANESTHESIA/SEDATION: General anesthesia. MEDICATIONS: Intra arterial Verapamil 5 mg; intra arterial Integrilin 19 mg. CONTRAST:  9m OMNIPAQUE IOHEXOL 300 MG/ML SOLN, 755mOMNIPAQUE IOHEXOL 240 MG/ML SOLN PROCEDURE: Operator: Dr.  KaPedro EarlsMD. No healthcare proxy or next of kin available for informed consent. Discussion held with the neurohospitalist attending. Agreement reached that the procedure would be in the best interest of the patient and benefits outweighed risks. The patient was made to lie supine on the angiography table. He was prepped and draped utilizing the usual sterile technique. Using gray scale ultrasound-guided, a micropuncture kit and modified Seldinger technique, access was gained to the right common femoral artery. An 8 French femoral sheath was then placed. Under fluoroscopy, an 8 Fr[place]rus balloon guide catheter was navigated over a 6 FrPakistanerenstein 2 catheter and a 0.038 inch Terumo Glidewire into the aortic arch. Under fluoroscopy, the catheter was advanced into the right common carotid artery and then into the right internal carotid artery. Frontal and lateral angiograms of the head were obtained showing a distal right M1/MCA occlusion with minimal delayed penetration of contrast. A large bore aspiration catheter was then navigated through the walrus balloon guide catheter and over a phenom 21 microcatheter and a synchro support microguidewire into the cavernous segment of the right ICA. The microcatheter was then advanced over the microwire into a right M1/MCA middle division branch. A 6 mm solitaire stent retriever was deployed spanning the right M1/MCA and proximal M2/middle division branch. Device was allowed to intercalated with the clot for 4 minutes. The microcatheter was removed under fluoroscopy. The guiding catheter balloon was inflated at the level of the proximal petrous segment. Thrombectomy device and aspiration catheter were then removed under constant aspiration. Follow-up angiogram showed recanalization of the M1 segment with subocclusive filling defect and slow flow in distal MCA branches (TICI 2C). A large bore aspiration catheter was again navigated through the walrus  balloon guide catheter and over a phenom 21 microcatheter and a synchro support microguidewire into the cavernous segment of the right ICA. The microcatheter was then advanced over the microwire into a  right M1/MCA posterior division branch. A 6 mm solitaire stent retriever was deployed spanning the right M1/MCA and proximal M2 posterior division branch. Device was allowed to intercalated with the clot for 4 minutes. The microcatheter was removed under fluoroscopy. The guiding catheter balloon was inflated at the level of the proximal petrous segment. Thrombectomy device and aspiration catheter were then removed under constant aspiration. Follow-up angiogram show normal forward flow into the right MCA vascular territory noting a small dissection flap in the mid/distal right M1/MCA segment. Delayed follow-up angiogram showed progressive reocclusion of the right M1/MCA with minimal forward flow. Flat panel CT of the head was obtained and post processed in a separate workstation under concurrent attending physician supervision. No evidence of hemorrhagic complication noted. Right ICA angiograms with frontal lateral views of the head were obtained. Further progression of occlusion in the right M1 segment was noted. Small non flow limiting iatrogenic dissection in the upper cervical segment of the right ICA identified. The large bore aspiration catheter was navigated through the walrus balloon guide catheter and over a SL 10 microcatheter and a synchro support microguidewire into the cavernous segment of the right ICA. The microcatheter was then advanced over the microwire into a right M1/MCA posterior division branch. Intra arterial infusion of 19 mg of Integrilin was performed into the right ICA. Subsequently, a 3 mm x 15 mm atlas intracranial stent was deployed in the M1 segment across the dissection flap. Follow-up angiogram showed adequate stent positioning with complete restoration of the anterograde flow in the  right MCA. Delayed follow-up angiogram (x2) show no evidence of reocclusion. 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 Postprocedural flat panel CT of the head was obtained and post processed in a separate workstation under concurrent attending physician supervision. No evidence of hemorrhagic complication noted. The catheter system was withdrawn. A right common femoral artery angiogram was obtained showing access at the level of the right common femoral artery. The femoral sheath was removed and the access was closed with a Perclose ProGlide. Immediate hemostasis achieved. 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 FINDINGS: Dissection of the mid/distal right M1/MCA. IMPRESSION: 1. Mechanical thrombectomy performed (x2) with temporary restoration of flow revealing dissection flap in the mid/distal right M1/MCA segment. 2. Intracranial stenting placed in the right M1/MCA across the dissection flap with complete restoration of flow. 3. No hemorrhagic complication on postprocedural flat panel CT. No embolus to new territory. PLAN: Patient is to remain on Integrilin drip. A follow-up head CT will be obtained at 6:30 p.m. today. At this point, patient will be re-evaluated for transition to oral dual anti-platelet therapy. Electronically Signed   By: Pedro Earls M.D.   On: 06/04/2019 17:40    IR CT Head Ltd   Result Date: 06/04/2019 CLINICAL DATA:  59 year old male with past medical history significant for colon cancer in 2012. He developed left-sided weakness and neglect on awakening this morning. He was taken to [facility] hospital. Head CT showed small hypodensity in the right insula with no hemorrhage. NIHSS 10. CT angiogram showed tapering of the right M1/MCA with short-segment filling defect. CT perfusion showed an estimated core volume of 25 mL with a mismatch of 126 mL. He  was transferred to our service for emergency treatment of the right M1/MCA occlusion. EXAM: IR CT HEAD LIMITED; IR PERCUTANEOUS ART THORMBECTOMY/INFUSION INTRACRANIAL INCLUDE DIAG ANGIO; IR ULTRASOUND GUIDANCE VASC ACCESS RIGHT;  INTRACRANIAL STENT (INCL PTA) ANESTHESIA/SEDATION: General anesthesia. MEDICATIONS: Intra arterial Verapamil 5 mg; intra arterial Integrilin 19 mg. CONTRAST:  43m OMNIPAQUE IOHEXOL 300 MG/ML SOLN, 728mOMNIPAQUE IOHEXOL 240 MG/ML SOLN PROCEDURE: Operator: Dr. KaPedro EarlsMD. No healthcare proxy or next of kin available for informed consent. Discussion held with the neurohospitalist attending. Agreement reached that the procedure would be in the best interest of the patient and benefits outweighed risks. The patient was made to lie supine on the angiography table. He was prepped and draped utilizing the usual sterile technique. Using gray scale ultrasound-guided, a micropuncture kit and modified Seldinger technique, access was gained to the right common femoral artery. An 8 French femoral sheath was then placed. Under fluoroscopy, an 8 Fr[place]rus balloon guide catheter was navigated over a 6 FrPakistanerenstein 2 catheter and a 0.038 inch Terumo Glidewire into the aortic arch. Under fluoroscopy, the catheter was advanced into the right common carotid artery and then into the right internal carotid artery. Frontal and lateral angiograms of the head were obtained showing a distal right M1/MCA occlusion with minimal delayed penetration of contrast. A large bore aspiration catheter was then navigated through the walrus balloon guide catheter and over a phenom 21 microcatheter and a synchro support microguidewire into the cavernous segment of the right ICA. The microcatheter was then advanced over the microwire into a right M1/MCA middle division branch. A 6 mm solitaire stent retriever was deployed spanning the right M1/MCA and proximal M2/middle division branch. Device was  allowed to intercalated with the clot for 4 minutes. The microcatheter was removed under fluoroscopy. The guiding catheter balloon was inflated at the level of the proximal petrous segment. Thrombectomy device and aspiration catheter were then removed under constant aspiration. Follow-up angiogram showed recanalization of the M1 segment with subocclusive filling defect and slow flow in distal MCA branches (TICI 2C). A large bore aspiration catheter was again navigated through the walrus balloon guide catheter and over a phenom 21 microcatheter and a synchro support microguidewire into the cavernous segment of the right ICA. The microcatheter was then advanced over the microwire into a right M1/MCA posterior division branch. A 6 mm solitaire stent retriever was deployed spanning the right M1/MCA and proximal M2 posterior division branch. Device was allowed to intercalated with the clot for 4 minutes. The microcatheter was removed under fluoroscopy. The guiding catheter balloon was inflated at the level of the proximal petrous segment. Thrombectomy device and aspiration catheter were then removed under constant aspiration. Follow-up angiogram show normal forward flow into the right MCA vascular territory noting a small dissection flap in the mid/distal right M1/MCA segment. Delayed follow-up angiogram showed progressive reocclusion of the right M1/MCA with minimal forward flow. Flat panel CT of the head was obtained and post processed in a separate workstation under concurrent attending physician supervision. No evidence of hemorrhagic complication noted. Right ICA angiograms with frontal lateral views of the head were obtained. Further progression of occlusion in the right M1 segment was noted. Small non flow limiting iatrogenic dissection in the upper cervical segment of the right ICA identified. The large bore aspiration catheter was navigated through the walrus balloon guide catheter and over a SL 10 microcatheter  and a synchro support microguidewire into the cavernous segment of the right ICA. The microcatheter was then advanced over the microwire into a right M1/MCA posterior division branch. Intra arterial infusion of 19 mg of Integrilin was performed into the right ICA. Subsequently, a 3 mm x 15  mm atlas intracranial stent was deployed in the M1 segment across the dissection flap. Follow-up angiogram showed adequate stent positioning with complete restoration of the anterograde flow in the right MCA. Delayed follow-up angiogram (x2) show no evidence of reocclusion. 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 Postprocedural flat panel CT of the head was obtained and post processed in a separate workstation under concurrent attending physician supervision. No evidence of hemorrhagic complication noted. The catheter system was withdrawn. A right common femoral artery angiogram was obtained showing access at the level of the right common femoral artery. The femoral sheath was removed and the access was closed with a Perclose ProGlide. Immediate hemostasis achieved. 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 FINDINGS: Dissection of the mid/distal right M1/MCA. IMPRESSION: 1. Mechanical thrombectomy performed (x2) with temporary restoration of flow revealing dissection flap in the mid/distal right M1/MCA segment. 2. Intracranial stenting placed in the right M1/MCA across the dissection flap with complete restoration of flow. 3. No hemorrhagic complication on postprocedural flat panel CT. No embolus to new territory. PLAN: Patient is to remain on Integrilin drip. A follow-up head CT will be obtained at 6:30 p.m. today. At this point, patient will be re-evaluated for transition to oral dual anti-platelet therapy. Electronically Signed   By: Pedro Earls M.D.   On: 06/04/2019 17:40    IR US Guide Vasc  Access Right   Result Date: 06/04/2019 CLINICAL DATA:  59 year old male with past medical history significant for colon cancer in 2012. He developed left-sided weakness and neglect on awakening this morning. He was taken to [facility] hospital. Head CT showed small hypodensity in the right insula with no hemorrhage. NIHSS 10. CT angiogram showed tapering of the right M1/MCA with short-segment filling defect. CT perfusion showed an estimated core volume of 25 mL with a mismatch of 126 mL. He was transferred to our service for emergency treatment of the right M1/MCA occlusion. EXAM: IR CT HEAD LIMITED; IR PERCUTANEOUS ART THORMBECTOMY/INFUSION INTRACRANIAL INCLUDE DIAG ANGIO; IR ULTRASOUND GUIDANCE VASC ACCESS RIGHT; INTRACRANIAL STENT (INCL PTA) ANESTHESIA/SEDATION: General anesthesia. MEDICATIONS: Intra arterial Verapamil 5 mg; intra arterial Integrilin 19 mg. CONTRAST:  75m OMNIPAQUE IOHEXOL 300 MG/ML SOLN, 750mOMNIPAQUE IOHEXOL 240 MG/ML SOLN PROCEDURE: Operator: Dr. KaPedro EarlsMD. No healthcare proxy or next of kin available for informed consent. Discussion held with the neurohospitalist attending. Agreement reached that the procedure would be in the best interest of the patient and benefits outweighed risks. The patient was made to lie supine on the angiography table. He was prepped and draped utilizing the usual sterile technique. Using gray scale ultrasound-guided, a micropuncture kit and modified Seldinger technique, access was gained to the right common femoral artery. An 8 French femoral sheath was then placed. Under fluoroscopy, an 8 Fr[place]rus balloon guide catheter was navigated over a 6 FrPakistanerenstein 2 catheter and a 0.038 inch Terumo Glidewire into the aortic arch. Under fluoroscopy, the catheter was advanced into the right common carotid artery and then into the right internal carotid artery. Frontal and lateral angiograms of the head were obtained showing a distal  right M1/MCA occlusion with minimal delayed penetration of contrast. A large bore aspiration catheter was then navigated through the walrus balloon guide catheter and over a phenom 21 microcatheter and a synchro support microguidewire into the cavernous segment of the right ICA. The microcatheter was then advanced over  the microwire into a right M1/MCA middle division branch. A 6 mm solitaire stent retriever was deployed spanning the right M1/MCA and proximal M2/middle division branch. Device was allowed to intercalated with the clot for 4 minutes. The microcatheter was removed under fluoroscopy. The guiding catheter balloon was inflated at the level of the proximal petrous segment. Thrombectomy device and aspiration catheter were then removed under constant aspiration. Follow-up angiogram showed recanalization of the M1 segment with subocclusive filling defect and slow flow in distal MCA branches (TICI 2C). A large bore aspiration catheter was again navigated through the walrus balloon guide catheter and over a phenom 21 microcatheter and a synchro support microguidewire into the cavernous segment of the right ICA. The microcatheter was then advanced over the microwire into a right M1/MCA posterior division branch. A 6 mm solitaire stent retriever was deployed spanning the right M1/MCA and proximal M2 posterior division branch. Device was allowed to intercalated with the clot for 4 minutes. The microcatheter was removed under fluoroscopy. The guiding catheter balloon was inflated at the level of the proximal petrous segment. Thrombectomy device and aspiration catheter were then removed under constant aspiration. Follow-up angiogram show normal forward flow into the right MCA vascular territory noting a small dissection flap in the mid/distal right M1/MCA segment. Delayed follow-up angiogram showed progressive reocclusion of the right M1/MCA with minimal forward flow. Flat panel CT of the head was obtained and post  processed in a separate workstation under concurrent attending physician supervision. No evidence of hemorrhagic complication noted. Right ICA angiograms with frontal lateral views of the head were obtained. Further progression of occlusion in the right M1 segment was noted. Small non flow limiting iatrogenic dissection in the upper cervical segment of the right ICA identified. The large bore aspiration catheter was navigated through the walrus balloon guide catheter and over a SL 10 microcatheter and a synchro support microguidewire into the cavernous segment of the right ICA. The microcatheter was then advanced over the microwire into a right M1/MCA posterior division branch. Intra arterial infusion of 19 mg of Integrilin was performed into the right ICA. Subsequently, a 3 mm x 15 mm atlas intracranial stent was deployed in the M1 segment across the dissection flap. Follow-up angiogram showed adequate stent positioning with complete restoration of the anterograde flow in the right MCA. Delayed follow-up angiogram (x2) show no evidence of reocclusion. 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 Postprocedural flat panel CT of the head was obtained and post processed in a separate workstation under concurrent attending physician supervision. No evidence of hemorrhagic complication noted. The catheter system was withdrawn. A right common femoral artery angiogram was obtained showing access at the level of the right common femoral artery. The femoral sheath was removed and the access was closed with a Perclose ProGlide. Immediate hemostasis achieved. 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 FINDINGS: Dissection of the mid/distal right M1/MCA. IMPRESSION: 1. Mechanical thrombectomy performed (x2) with temporary restoration of flow revealing dissection flap in the mid/distal right M1/MCA segment. 2.  Intracranial stenting placed in the right M1/MCA across the dissection flap with complete restoration of flow. 3. No hemorrhagic complication on postprocedural flat panel CT. No embolus to new territory. PLAN: Patient is to remain on Integrilin drip. A follow-up head CT will be obtained at 6:30 p.m. today. At this point, patient will be re-evaluated for transition to oral dual anti-platelet therapy. Electronically Signed  By: Pedro Earls M.D.   On: 06/04/2019 17:40    CT CEREBRAL PERFUSION W CONTRAST   Addendum Date: 06/04/2019   ADDENDUM REPORT: 06/04/2019 12:21 ADDENDUM: Contrast dose is 100 mL Omnipaque 350 Electronically Signed   By: Macy Mis M.D.   On: 06/04/2019 12:21    Result Date: 06/04/2019 CLINICAL DATA:  Code stroke.  Slurred speech and left facial droop EXAM: CT ANGIOGRAPHY HEAD AND NECK CT PERFUSION BRAIN TECHNIQUE: Multidetector CT imaging of the head and neck was performed using the standard protocol during bolus administration of intravenous contrast. Multiplanar CT image reconstructions and MIPs were obtained to evaluate the vascular anatomy. Carotid stenosis measurements (when applicable) are obtained utilizing NASCET criteria, using the distal internal carotid diameter as the denominator. Multiphase CT imaging of the brain was performed following IV bolus contrast injection. Subsequent parametric perfusion maps were calculated using RAPID software. COMPARISON:  None. FINDINGS: CT HEAD Brain: There is no acute intracranial hemorrhage mass effect edema. Gray-white differentiation is preserved. Ventricles and sulci normal in size and configuration. There is no extra-axial fluid collection. Vascular: Hyperdense vessel. Skull: Unremarkable. Sinuses/Orbits: Lobular mucosal thickening with greatest involvement of the right maxillary sinus. No acute orbital finding. Other: Mastoid air cells are clear ASPECTS [facility] Stroke Program Early CT Score) - Ganglionic level  infarction (caudate, lentiform nuclei, internal capsule, insula, M1-M3 cortex): 7 - Supraganglionic infarction (M4-M6 cortex): 3 Total score (0-10 with 10 being normal): 10 Review of the MIP images confirms the above findings CTA NECK Aortic arch: Great vessel origins are patent. Right carotid system: Patent. There is trace calcified plaque at the ICA origin measurable stenosis. Left carotid system: Patent. Eccentric noncalcified plaque causing less than 50% stenosis. Mild calcified plaque at the ICA origin causing less than 50% stenosis. Vertebral arteries: Patent.  Left vertebral artery dominant. Skeleton: Cervical spine degenerative changes. Other neck: No mass or adenopathy Upper chest: No apical lung mass. Review of the MIP images confirms the above findings CTA HEAD Anterior circulation: Intracranial internal carotid arteries patent. There is nonocclusive thrombus within distal right M1 MCA extending to the bifurcation. Left middle cerebral artery is patent. Anterior cerebral arteries are patent. Congenitally absent right A1 ACA. Posterior circulation: Intracranial vertebral arteries, basilar artery, and posterior cerebral arteries are patent. There are bilateral patent posterior communicating arteries. Venous sinuses: As permitted by contrast timing, patent. Review of the MIP images confirms the above findings CT Brain Perfusion: CBF (<30%) Volume: 94m Perfusion (Tmax>6.0s) volume: 1576mMismatch Volume: 12696mnfarction Location: Right MCA territory IMPRESSION: No acute intracranial hemorrhage or evidence of acute infarction. ASPECT score is 10. 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 Perfusion imaging demonstrates 25 mL core infarction and 126 mL territory at risk in the right MCA territory. No hemodynamically significant stenosis in the neck. These results were called by telephone at the time of interpretation on 06/04/2019 at 11:53 am to provider JONNew [facility] Baptist HospitalLESAlexis Goodellwho verbally acknowledged these results. Electronically Signed: By: PraMacy MisD. On: 06/04/2019 11:59    DG CHEST PORT 1 VIEW   Result Date: 06/05/2019 CLINICAL DATA:  NG tube placement. EXAM: PORTABLE CHEST 1 VIEW COMPARISON:  CT angiogram of the chest dated 09/13/2002 FINDINGS: NG tube tip is below the diaphragm. Heart size and pulmonary vascularity are normal. Lungs are clear. No significant bone abnormality. IMPRESSION: Normal chest.  NG tube tip below the diaphragm. Electronically Signed   By: JamLorriane Shire  M.D.   On: 06/05/2019 14:29    DG Abd Portable 1V   Result Date: 06/05/2019 CLINICAL DATA:  Nasogastric tube placement. EXAM: PORTABLE ABDOMEN - 1 VIEW COMPARISON:  Same day. FINDINGS: The bowel gas pattern is normal. Distal tip of nasogastric tube is seen in expected position of the gastroesophageal junction. No radio-opaque calculi or other significant radiographic abnormality are seen. IMPRESSION: Distal tip of nasogastric tube seen in expected position of gastroesophageal junction. Proximal side hole is seen in distal esophagus. Advancement is recommended. Electronically Signed   By: Marijo Conception M.D.   On: 06/05/2019 12:01    DG Abd Portable 1V   Result Date: 06/05/2019 CLINICAL DATA:  Nasogastric tube placement EXAM: PORTABLE ABDOMEN - 1 VIEW COMPARISON:  Portable exam 1050 hours compared to 0858 hours FINDINGS: Tip of nasogastric tube projects over stomach though the proximal side-port projects over the distal esophagus; recommend advancing tube 5 cm. Nonobstructive bowel gas pattern. No bowel dilatation or bowel wall thickening. Osseous structures unremarkable. IMPRESSION: Proximal side-port of nasogastric tube projects over distal esophagus; recommend advancing tube 5 cm. Electronically Signed   By: Lavonia Dana M.D.   On: 06/05/2019 11:00    DG Abd Portable 1V   Result Date: 06/05/2019 CLINICAL DATA:  NG tube placement EXAM: PORTABLE ABDOMEN -  1 VIEW COMPARISON:  06/04/2019 FINDINGS: Esophagogastric tube remains with tip below the diaphragm and side port above the gastroesophageal junction. General paucity of bowel although scattered gas is gas present to the rectum. No free air on supine radiographs. IMPRESSION: Esophagogastric tube remains with tip below the diaphragm and side port above the gastroesophageal junction. Recommend advancement to ensure subdiaphragmatic positioning of tip and side port. Electronically Signed   By: Eddie Candle M.D.   On: 06/05/2019 09:25    ECHOCARDIOGRAM COMPLETE   Result Date: 06/05/2019    ECHOCARDIOGRAM REPORT   Patient Name:   WESSON STITH Date of Exam: 06/05/2019 Medical Rec #:  [id number]      Height:       71.0 in Accession #:    [id number]     Weight:       231.5 lb Date of Birth:  1960-08-28      BSA:          2.244 m Patient Age:    68 years       BP:           126/67 mmHg Patient Gender: M              HR:           70 bpm. Exam Location:  Inpatient Procedure: 2D Echo, Cardiac Doppler and Color Doppler Indications:    Stroke 434.91/I163.9  History:        Patient has no prior history of Echocardiogram examinations.                 Risk Factors:Former Smoker. CVA.  Sonographer:    Clayton Lefort RDCS (AE) Referring Phys: [phone] MCNEILL P [place]  1. Left ventricular ejection fraction, by estimation, is 65 to 70%. The left ventricle has normal function. The left ventricle has no regional wall motion abnormalities. There is severe left ventricular hypertrophy. Left ventricular diastolic parameters  were normal.  2. Right ventricular systolic function is normal. The right ventricular size is normal.  3. The mitral valve is abnormal. Trivial mitral valve regurgitation.  4. The aortic valve is tricuspid. Aortic valve regurgitation is not visualized. No  aortic stenosis is present.  5. The inferior vena cava is normal in size with <50% respiratory variability, suggesting right atrial pressure of 8 mmHg.  FINDINGS  Left Ventricle: Left ventricular ejection fraction, by estimation, is 65 to 70%. The left ventricle has normal function. The left ventricle has no regional wall motion abnormalities. The left ventricular internal cavity size was normal in size. There is  severe left ventricular hypertrophy. Left ventricular diastolic parameters were normal. Right Ventricle: The right ventricular size is normal. No increase in right ventricular wall thickness. Right ventricular systolic function is normal. Left Atrium: Left atrial size was normal in size. Right Atrium: Right atrial size was normal in size. Pericardium: There is no evidence of pericardial effusion. Mitral Valve: The mitral valve is abnormal. There is mild thickening of the mitral valve leaflet(s). Trivial mitral valve regurgitation. MV peak gradient, 3.6 mmHg. The mean mitral valve gradient is 1.0 mmHg. Tricuspid Valve: The tricuspid valve is grossly normal. Tricuspid valve regurgitation is trivial. Aortic Valve: The aortic valve is tricuspid. Aortic valve regurgitation is not visualized. No aortic stenosis is present. Pulmonic Valve: The pulmonic valve was grossly normal. Pulmonic valve regurgitation is not visualized. Aorta: The aortic root, ascending aorta, aortic arch and descending aorta are all structurally normal, with no evidence of dilitation or obstruction. Venous: The inferior vena cava is normal in size with less than 50% respiratory variability, suggesting right atrial pressure of 8 mmHg. IAS/Shunts: The interatrial septum was not well visualized.  LEFT VENTRICLE PLAX 2D LVIDd:         4.02 cm  Diastology LVIDs:         2.40 cm  LV e' lateral:   10.40 cm/s LV PW:         1.68 cm  LV E/e' lateral: 9.0 LV IVS:        1.71 cm  LV e' medial:    9.79 cm/s LVOT diam:     2.10 cm  LV E/e' medial:  9.6 LV SV:         80 LV SV Index:   36 LVOT Area:     3.46 cm  RIGHT VENTRICLE             IVC RV Basal diam:  2.29 cm     IVC diam: 1.86 cm RV S prime:      14.10 cm/s TAPSE (M-mode): 2.7 cm LEFT ATRIUM             Index       RIGHT ATRIUM           Index LA diam:        2.70 cm 1.20 cm/m  RA Area:     11.90 cm LA Vol (A2C):   52.4 ml 23.35 ml/m RA Volume:   23.60 ml  10.52 ml/m LA Vol (A4C):   40.7 ml 18.14 ml/m LA Biplane Vol: 47.6 ml 21.21 ml/m  AORTIC VALVE AV Area (Vmean):   3.20 cm AV Area (VTI):     3.24 cm AV Vmean:          88.200 cm/s AV VTI:            0.246 m LVOT Vmax:         118.00 cm/s LVOT Vmean:        81.500 cm/s LVOT VTI:          0.230 m LVOT/AV VTI ratio: 0.93  AORTA Ao Root diam: 3.40 cm Ao Asc diam:  3.60 cm MITRAL VALVE MV Area (PHT): 3.27 cm    SHUNTS MV Peak grad:  3.6 mmHg    Systemic VTI:  0.23 m MV Mean grad:  1.0 mmHg    Systemic Diam: 2.10 cm MV Vmax:       0.94 m/s MV Vmean:      56.1 cm/s MV Decel Time: 232 msec MV E velocity: 94.10 cm/s MV A velocity: 80.00 cm/s MV E/A ratio:  1.18 Lyman Bishop MD Electronically signed by Lyman Bishop MD Signature Date/Time: 06/05/2019/11:23:20 AM    Final     IR PERCUTANEOUS ART THROMBECTOMY/INFUSION INTRACRANIAL INC DIAG ANGIO   Result Date: 06/04/2019 CLINICAL DATA:  59 year old male with past medical history significant for colon cancer in 2012. He developed left-sided weakness and neglect on awakening this morning. He was taken to [facility] hospital. Head CT showed small hypodensity in the right insula with no hemorrhage. NIHSS 10. CT angiogram showed tapering of the right M1/MCA with short-segment filling defect. CT perfusion showed an estimated core volume of 25 mL with a mismatch of 126 mL. He was transferred to our service for emergency treatment of the right M1/MCA occlusion. EXAM: IR CT HEAD LIMITED; IR PERCUTANEOUS ART THORMBECTOMY/INFUSION INTRACRANIAL INCLUDE DIAG ANGIO; IR ULTRASOUND GUIDANCE VASC ACCESS RIGHT; INTRACRANIAL STENT (INCL PTA) ANESTHESIA/SEDATION: General anesthesia. MEDICATIONS: Intra arterial Verapamil 5 mg; intra arterial Integrilin 19 mg.  CONTRAST:  81m OMNIPAQUE IOHEXOL 300 MG/ML SOLN, 779mOMNIPAQUE IOHEXOL 240 MG/ML SOLN PROCEDURE: Operator: Dr. KaPedro EarlsMD. No healthcare proxy or next of kin available for informed consent. Discussion held with the neurohospitalist attending. Agreement reached that the procedure would be in the best interest of the patient and benefits outweighed risks. The patient was made to lie supine on the angiography table. He was prepped and draped utilizing the usual sterile technique. Using gray scale ultrasound-guided, a micropuncture kit and modified Seldinger technique, access was gained to the right common femoral artery. An 8 French femoral sheath was then placed. Under fluoroscopy, an 8 FrPak[place]alrus balloon guide catheter was navigated over a 6 FrPakistanerenstein 2 catheter and a 0.038 inch Terumo Glidewire into the aortic arch. Under fluoroscopy, the catheter was advanced into the right common carotid artery and then into the right internal carotid artery. Frontal and lateral angiograms of the head were obtained showing a distal right M1/MCA occlusion with minimal delayed penetration of contrast. A large bore aspiration catheter was then navigated through the walrus balloon guide catheter and over a phenom 21 microcatheter and a synchro support microguidewire into the cavernous segment of the right ICA. The microcatheter was then advanced over the microwire into a right M1/MCA middle division branch. A 6 mm solitaire stent retriever was deployed spanning the right M1/MCA and proximal M2/middle division branch. Device was allowed to intercalated with the clot for 4 minutes. The microcatheter was removed under fluoroscopy. The guiding catheter balloon was inflated at the level of the proximal petrous segment. Thrombectomy device and aspiration catheter were then removed under constant aspiration. Follow-up angiogram showed recanalization of the M1 segment with subocclusive filling defect and slow  flow in distal MCA branches (TICI 2C). A large bore aspiration catheter was again navigated through the walrus balloon guide catheter and over a phenom 21 microcatheter and a synchro support microguidewire into the cavernous segment of the right ICA. The microcatheter was then advanced over the microwire into a right M1/MCA posterior division branch. A 6 mm solitaire stent retriever was deployed spanning  the right M1/MCA and proximal M2 posterior division branch. Device was allowed to intercalated with the clot for 4 minutes. The microcatheter was removed under fluoroscopy. The guiding catheter balloon was inflated at the level of the proximal petrous segment. Thrombectomy device and aspiration catheter were then removed under constant aspiration. Follow-up angiogram show normal forward flow into the right MCA vascular territory noting a small dissection flap in the mid/distal right M1/MCA segment. Delayed follow-up angiogram showed progressive reocclusion of the right M1/MCA with minimal forward flow. Flat panel CT of the head was obtained and post processed in a separate workstation under concurrent attending physician supervision. No evidence of hemorrhagic complication noted. Right ICA angiograms with frontal lateral views of the head were obtained. Further progression of occlusion in the right M1 segment was noted. Small non flow limiting iatrogenic dissection in the upper cervical segment of the right ICA identified. The large bore aspiration catheter was navigated through the walrus balloon guide catheter and over a SL 10 microcatheter and a synchro support microguidewire into the cavernous segment of the right ICA. The microcatheter was then advanced over the microwire into a right M1/MCA posterior division branch. Intra arterial infusion of 19 mg of Integrilin was performed into the right ICA. Subsequently, a 3 mm x 15 mm atlas intracranial stent was deployed in the M1 segment across the dissection flap.  Follow-up angiogram showed adequate stent positioning with complete restoration of the anterograde flow in the right MCA. Delayed follow-up angiogram (x2) show no evidence of reocclusion. 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 Postprocedural flat panel CT of the head was obtained and post processed in a separate workstation under concurrent attending physician supervision. No evidence of hemorrhagic complication noted. The catheter system was withdrawn. A right common femoral artery angiogram was obtained showing access at the level of the right common femoral artery. The femoral sheath was removed and the access was closed with a Perclose ProGlide. Immediate hemostasis achieved. 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 FINDINGS: Dissection of the mid/distal right M1/MCA. IMPRESSION: 1. Mechanical thrombectomy performed (x2) with temporary restoration of flow revealing dissection flap in the mid/distal right M1/MCA segment. 2. Intracranial stenting placed in the right M1/MCA across the dissection flap with complete restoration of flow. 3. No hemorrhagic complication on postprocedural flat panel CT. No embolus to new territory. PLAN: Patient is to remain on Integrilin drip. A follow-up head CT will be obtained at 6:30 p.m. today. At this point, patient will be re-evaluated for transition to oral dual anti-platelet therapy. Electronically Signed   By: Pedro Earls M.D.   On: 06/04/2019 17:40    CT HEAD CODE STROKE WO CONTRAST   Addendum Date: 06/04/2019   ADDENDUM REPORT: 06/04/2019 12:21 ADDENDUM: Contrast dose is 100 mL Omnipaque 350 Electronically Signed   By: Macy Mis M.D.   On: 06/04/2019 12:21    Result Date: 06/04/2019 CLINICAL DATA:  Code stroke.  Slurred speech and left facial droop EXAM: CT ANGIOGRAPHY HEAD AND NECK CT PERFUSION BRAIN TECHNIQUE: Multidetector CT  imaging of the head and neck was performed using the standard protocol during bolus administration of intravenous contrast. Multiplanar CT image reconstructions and MIPs were obtained to evaluate the vascular anatomy. Carotid stenosis measurements (when applicable) are obtained utilizing NASCET criteria, using the distal internal carotid diameter as the denominator. Multiphase CT imaging of the brain was performed following IV bolus  contrast injection. Subsequent parametric perfusion maps were calculated using RAPID software. COMPARISON:  None. FINDINGS: CT HEAD Brain: There is no acute intracranial hemorrhage mass effect edema. Gray-white differentiation is preserved. Ventricles and sulci normal in size and configuration. There is no extra-axial fluid collection. Vascular: Hyperdense vessel. Skull: Unremarkable. Sinuses/Orbits: Lobular mucosal thickening with greatest involvement of the right maxillary sinus. No acute orbital finding. Other: Mastoid air cells are clear ASPECTS [facility] Stroke Program Early CT Score) - Ganglionic level infarction (caudate, lentiform nuclei, internal capsule, insula, M1-M3 cortex): 7 - Supraganglionic infarction (M4-M6 cortex): 3 Total score (0-10 with 10 being normal): 10 Review of the MIP images confirms the above findings CTA NECK Aortic arch: Great vessel origins are patent. Right carotid system: Patent. There is trace calcified plaque at the ICA origin measurable stenosis. Left carotid system: Patent. Eccentric noncalcified plaque causing less than 50% stenosis. Mild calcified plaque at the ICA origin causing less than 50% stenosis. Vertebral arteries: Patent.  Left vertebral artery dominant. Skeleton: Cervical spine degenerative changes. Other neck: No mass or adenopathy Upper chest: No apical lung mass. Review of the MIP images confirms the above findings CTA HEAD Anterior circulation: Intracranial internal carotid arteries patent. There is nonocclusive thrombus within distal  right M1 MCA extending to the bifurcation. Left middle cerebral artery is patent. Anterior cerebral arteries are patent. Congenitally absent right A1 ACA. Posterior circulation: Intracranial vertebral arteries, basilar artery, and posterior cerebral arteries are patent. There are bilateral patent posterior communicating arteries. Venous sinuses: As permitted by contrast timing, patent. Review of the MIP images confirms the above findings CT Brain Perfusion: CBF (<30%) Volume: 40m Perfusion (Tmax>6.0s) volume: 1549mMismatch Volume: 12617mnfarction Location: Right MCA territory IMPRESSION: No acute intracranial hemorrhage or evidence of acute infarction. ASPECT score is 10. 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 Perfusion imaging demonstrates 25 mL core infarction and 126 mL territory at risk in the right MCA territory. No hemodynamically significant stenosis in the neck. These results were called by telephone at the time of interpretation on 06/04/2019 at 11:53 am to provider JONCumberland Hall HospitalLESAlexis Goodellwho verbally acknowledged these results. Electronically Signed: By: PraMacy MisD. On: 06/04/2019 11:59         Assessment/Plan: Diagnosis: R MCA infarct with right M1 distal occlusion  1. Does the need for close, 24 hr/day medical supervision in concert with the patient's rehab needs make it unreasonable for this patient to be served in a less intensive setting? Yes 2. Co-Morbidities requiring supervision/potential complications: HLD, new-onset DM type 2, dysphagia, obesity, ETOH use, former cigarette smoker 3. Due to bladder management, bowel management, safety, skin/wound care, disease management, medication administration, pain management and patient education, does the patient require 24 hr/day rehab nursing? Yes 4. Does the patient require coordinated care of a physician, rehab nurse, therapy disciplines of PT, OT, SLP to address physical and  functional deficits in the context of the above medical diagnosis(es)? Yes Addressing deficits in the following areas: balance, endurance, locomotion, strength, transferring, bowel/bladder control, bathing, dressing, feeding, grooming, toileting, cognition, speech, language, swallowing and psychosocial support 5. Can the patient actively participate in an intensive therapy program of at least 3 hrs of therapy per day at least 5 days per week? Yes 6. The potential for patient to make measurable gains while on inpatient rehab is excellent 7. Anticipated functional outcomes upon discharge from inpatient rehab are modified independent  with PT, modified independent with OT, modified independent  with SLP. 8. Estimated rehab length of stay to reach the above functional goals is: 20-22 days 9. Anticipated discharge destination: Home 10. Overall Rehab/Functional Prognosis: excellent   RECOMMENDATIONS: This patient's condition is appropriate for continued rehabilitative care in the following setting: CIR Patient has agreed to participate in recommended program. Yes Note that insurance prior authorization may be required for reimbursement for recommended care.   Comment: Mr. Fildes would be an excellent CIR candidate. He will have support from his daughter upon discharge.    For his right shoulder pain, may consider getting XR, applying lidocaine patch, and diclofenac gel.    Thank you for this consult. We will continue to follow in Mr. Glinski care.   Lavon Paganini Angiulli, PA-C 06/06/2019    I have personally performed a face to face diagnostic evaluation, including, but not limited to relevant history and physical exam findings, of this patient and developed relevant assessment and plan.  Additionally, I have reviewed and concur with the physician assistant's documentation above.   Leeroy Cha, MD

## 2019-06-11 NOTE — Care Management (Signed)
Patient Details  Name: Scott Day MRN: [id number] Date of Birth: 09-Mar-1961  Today's Date: 06/11/2019  Problem List:  Patient Active Problem List   Diagnosis Date Noted  . Dysphagia, post-stroke   . E. coli UTI   . Diabetes mellitus, new onset ([place])   . 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 Cerebrovascular accident (CVA) due to thrombosis of right middle cerebral artery ([place])   . Essential hypertension   . Oropharyngeal dysphagia   . Left hemiparesis ([place])   . Class 1 obesity due to excess calories with serious comorbidity and body mass index (BMI) of 32.0 to 32.9 in adult   . Acute ischemic cerebrovascular accident (CVA) involving right middle cerebral artery territory ([place]) 06/04/2019  . Arterial ischemic stroke, MCA, right, acute ([place]) 06/04/2019   Past Medical History:  Past Medical History:  Diagnosis Date  . Cancer [facility]) 2012   colon cancer  . Closed fracture of left distal radius   . GERD (gastroesophageal reflux disease)    Past Surgical History:  Past Surgical History:  Procedure Laterality Date  . BUBBLE STUDY  06/08/2019   Procedure: BUBBLE STUDY;  Surgeon: Buford Dresser, MD;  Location: [facility] ENDOSCOPY;  Service: Cardiovascular;;  . COLON SURGERY  2012   colon cancer  . IR CT HEAD LTD  06/04/2019  . IR CT HEAD LTD  06/04/2019  . IR INTRA CRAN STENT  06/04/2019  . IR PERCUTANEOUS ART THROMBECTOMY/INFUSION INTRACRANIAL INC DIAG ANGIO  06/04/2019  . IR US GUIDE VASC ACCESS RIGHT  06/04/2019  . OPEN REDUCTION INTERNAL FIXATION (ORIF) DISTAL RADIAL FRACTURE Left 09/20/2017   Procedure: OPEN REDUCTION INTERNAL FIXATION (ORIF) DISTAL RADIAL FRACTURE;  Surgeon: Leanora Cover, MD;  Location: [facility];  Service: Orthopedics;  Laterality: Left;  . RADIOLOGY WITH ANESTHESIA N/A 06/04/2019   Procedure: IR WITH ANESTHESIA;  Surgeon: Radiologist, Medication, MD;  Location: [facility];  Service: Radiology;  Laterality: N/A;  . TEE WITHOUT  CARDIOVERSION N/A 06/08/2019   Procedure: TRANSESOPHAGEAL ECHOCARDIOGRAM (TEE);  Surgeon: Buford Dresser, MD;  Location: [facility] ENDOSCOPY;  Service: Cardiovascular;  Laterality: N/A;   Social History:  reports that he has quit smoking. He has never used smokeless tobacco. He reports current alcohol use of about 24.0 standard drinks of alcohol per week. He reports that he does not use drugs.  Family / Support Systems Patient Roles: Parent Children: Daughter: Sales promotion account executive); Son Luiz Ochoa) Anticipated Caregiver: daughter [name]), son Barbaraann Share)  Ability/Limitations of Caregiver: both work, but have been rotating to give 24/7 assist in hospital, aware that pt will need 24/7 at home as well Caregiver Availability: 24/7  Social History Preferred language: English Religion:  Read: Yes Write: Yes Employment Status: Employed Return to Work Plans: Would like to return to work once cleared for duty   Abuse/Neglect Abuse/Neglect Assessment Can Be Completed: Yes Physical Abuse: Denies Verbal Abuse: Denies Sexual Abuse: Denies Exploitation of patient/patient's resources: Denies Self-Neglect: Denies  Emotional Status Pt's affect, behavior and adjustment status: Normal affect, mood and behavior Substance Abuse History: ETOH-beer; Tobacco  Patient / Family Perceptions, Expectations & Goals Pt/Family understanding of illness & functional limitations: Patient has a fair understanding of current health; reported he had no idea he was a diabetic or had HTN, HLD Premorbid pt/family roles/activities: Independent PTA Anticipated changes in roles/activities/participation: Daughter may need to provide supervision at discharge Pt/family expectations/goals: Would like to be as independent with self care as possible and patient would like to return to his normal  routine and go back to work  [employer] available at discharge: Children will provide transportation at discharge Resource  referrals recommended: Neuropsychology  Discharge Planning Living Arrangements: Children Support Systems: Children Type of Residence: Private residence [employer]: Self-pay Money Management: Patient Does the patient have any problems obtaining your medications?: No Home Management: Patient managed his home PTA; children will assist with home management at discharge [place] Barriers to Discharge: Decreased caregiver support, New diabetic, Medication compliance [place] Barriers to Discharge Comments: New DM and reluctant to change diet (likes lots of salt) Social Work Anticipated Follow Up Needs: HH/OP Expected length of stay: 7-10 days  Clinical Impression Patient lighthearted about diagnosis and recommended change in diet and new medications. Noted he did not know if he could adhere to a dietary restriction and give up certain foods, beer and salt. Liked his country ham biscuits for breakfast and was having a hard time finding food he liked. Reported he had talked with a dietician and nursing would provide more education on HTN, HLD , Dash diet and CMM dietary restrictions. Reported he had no idea he was a diabetic but now that he thought about it, had experienced low blood sugars when at work around lunch that resolved after he ate something. Daughter is super supportive and he noted he has three grand-children to be around for and he was willing to work on living healthier for them.  Margarito Liner 06/11/2019, 6:24 PM

## 2019-06-11 NOTE — Care Management (Signed)
[facility] Individual Statement of Services  Patient Name:  Scott Day  Date:  06/11/2019  Welcome to the [facility].  Our goal is to provide you with an individualized program based on your diagnosis and situation, designed to meet your specific needs.  With this comprehensive rehabilitation program, you will be expected to participate in at least 3 hours of rehabilitation therapies Monday-Friday, with modified therapy programming on the weekends.  Your rehabilitation program will include the following services:  Physical Therapy (PT), Occupational Therapy (OT), Speech Therapy (ST), 24 hour per day rehabilitation nursing, Therapeutic Recreaction (TR), Neuropsychology, Case Management (Social Worker), Rehabilitation Medicine, Nutrition Services and Pharmacy Services  Weekly team conferences will be held on Wednesdays to discuss your progress.  Your Social Industrial/product designer will talk with you frequently to get your input and to update you on team discussions.  Team conferences with you and your family in attendance may also be held.  Expected length of stay: 7-10 days  Overall anticipated outcome: Supervision  Depending on your progress and recovery, your program may change. Your Social Industrial/product designer will coordinate services and will keep you informed of any changes. Your Social Worker's/Care Manager's name and contact numbers are listed  below.  The following services may also be recommended but are not provided by the [facility] will be made to provide these services after discharge if needed.  Arrangements include referral to agencies that provide these services.  Your insurance has been verified to be:  Uninsured Your primary doctor is:  No PCP  Pertinent information will be  shared with your doctor and your insurance company.  Care Manager/Social Worker:  Dorien Chihuahua, RN  [phone] or [phone]  Information discussed with and copy given to patient by: Margarito Liner, 06/11/2019, 6:16 PM

## 2019-06-11 NOTE — Progress Notes (Signed)
Speech Language Pathology Daily Session Note  Patient Details  Name: Scott Day MRN: [id number] Date of Birth: [birth year]  Today's Date: 06/11/2019 SLP Individual Time: 0730-0800 SLP Individual Time Calculation (min): 30 min  Short Term Goals: Week 1: SLP Short Term Goal 1 (Week 1): Patient will demonstrate efficient mastication and oral clerance of rgular textures over 2 observed sessions to demonstrate readiness for diet advancement. SLP Short Term Goal 2 (Week 1): Patient will complete higher level reasoning and executive functioning tasks (ex: medication and money management) with 90% accuracy and min cues. SLP Short Term Goal 3 (Week 1): Patient will demonstrate functional problem sovling for basic and familiar (mildly complex) tasks with min verbal cues. SLP Short Term Goal 4 (Week 1): Patient will utilize external memory aids to recall new, daily information with min verbal cues. SLP Short Term Goal 5 (Week 1): Patient will demonstrate sustained attention to functional task for 20 minutes with min verbal cues for redirection.  Skilled Therapeutic Interventions:  Skilled treatment session targeted pt's cognition goals. SLP facilitiated session by providing Mod A cues for redirection to task. Specifically pt was internally distracted by showing this Conservator, museum/gallery on his phone as well has having had labs drawn etc. SLP provided Max A verbal cues to recall current medications (he had just been told by pt's nurse ~ 3 minutes prior). Additionally, pt required Mod A cues to comprehend inferred information. Pt left upright in bed, bed alarm on and all needs within reach. Continue per current plan of care.      Pain    Therapy/Group: Individual Therapy  Scott Day 06/11/2019, 8:52 AM

## 2019-06-11 NOTE — Progress Notes (Signed)
Patient information reviewed and entered into eRehab System by Becky Jovanni Eckhart, PPS coordinator. Information including medical coding, function ability, and quality indicators will be reviewed and updated through discharge.   

## 2019-06-11 NOTE — Progress Notes (Signed)
Physical Therapy Session Note  Patient Details  Name: Scott Day MRN: [id number] Date of Birth: 26-Feb-1961  Today's Date: 06/11/2019 PT Individual Time: [id number] PT Individual Time Calculation (min): 57 min    Short Term Goals: Week 1:  PT Short Term Goal 1 (Week 1): STG=LTGs due to LOS  Skilled Therapeutic Interventions/Progress Updates:    Patient supine in bed upon PT arrival, agreeable to therapy tx, reports some shoulder pain and stated putting muscle rub on prior to therapy which helps. Supine > sitting EOB mod-I and donned B shoes without assist. Sit > stand with supervision. Pt ambulated >1000' throughout session to elevated, down to ground floor and then outside on unlevel surfaces (I.e. ramp or uneven grass area) and negotiating obstacles. While outside, pt participated in FGA balance assessment and higher level balance activities, therapist provided supervision with gait on level surfaces and CGA-minA during higher level balance activities. Pt scored 26/30 on the FGA with greatest challenges with head nods, when vision was taken away, and when ambulating backwards. Pt ascended/descended 2 x 8 steps with alternate pattern and no rail with CGA for safety. Pt performed dual tasking during gait with naming grocery items from A-Z, pt demonstrated difficulty with cognitive component and dec gait speed minimally throughout, intermittent stopping to take a standing rest break when unable to come up with item. Pt then taken into the gift store and was given a task of finding items to buy if he had $10 and then again with $25, pt required cues regarding adding up the money that he had "spent" and to pay attention to signs that stated an item was on sale. Pt ascended 20 + 4 steps with alternating pattern and R rail with CGA. Pt continued to ambulate and utilize elevators to return to room, therapist asked pt to lead the way using his memory once back on his floor, pt able to remember room location  with min cueing. Stand > sitting in recliner with supervision. Pt left seated in recliner with needs in reach and chair alarm set.    Therapy Documentation Precautions:  Precautions Precautions: Fall Precaution Comments: safety, impulsive. Restrictions Weight Bearing Restrictions: No Balance: Balance Balance Assessed: Yes Standardized Balance Assessment Standardized Balance Assessment: Functional Gait Assessment Functional Gait  Assessment Gait assessed : Yes Gait Level Surface: Walks 20 ft in less than 5.5 sec, no assistive devices, good speed, no evidence for imbalance, normal gait pattern, deviates no more than 6 in outside of the 12 in walkway width. Change in Gait Speed: Able to smoothly change walking speed without loss of balance or gait deviation. Deviate no more than 6 in outside of the 12 in walkway width. Gait with Horizontal Head Turns: Performs head turns smoothly with no change in gait. Deviates no more than 6 in outside 12 in walkway width Gait with Vertical Head Turns: Performs task with slight change in gait velocity (eg, minor disruption to smooth gait path), deviates 6 - 10 in outside 12 in walkway width or uses assistive device Gait and Pivot Turn: Pivot turns safely within 3 sec and stops quickly with no loss of balance. Step Over Obstacle: Is able to step over 2 stacked shoe boxes taped together (9 in total height) without changing gait speed. No evidence of imbalance. Gait with Narrow Base of Support: Is able to ambulate for 10 steps heel to toe with no staggering. Gait with Eyes Closed: Walks 20 ft, uses assistive device, slower speed, mild gait deviations, deviates 6-10  in outside 12 in walkway width. Ambulates 20 ft in less than 9 sec but greater than 7 sec. Ambulating Backwards: Walks 20 ft, slow speed, abnormal gait pattern, evidence for imbalance, deviates 10-15 in outside 12 in walkway width. Steps: Alternating feet, no rail. Total Score:  26    Therapy/Group: Individual Therapy  Elmyra Ricks Wil[place]ia Addis SPT 06/11/2019, 3:00 PM

## 2019-06-12 ENCOUNTER — Inpatient Hospital Stay (HOSPITAL_COMMUNITY): Payer: Self-pay | Admitting: Physical Therapy

## 2019-06-12 ENCOUNTER — Inpatient Hospital Stay (HOSPITAL_COMMUNITY): Payer: Self-pay | Admitting: Speech Pathology

## 2019-06-12 ENCOUNTER — Inpatient Hospital Stay (HOSPITAL_COMMUNITY): Payer: Self-pay | Admitting: Occupational Therapy

## 2019-06-12 DIAGNOSIS — Z720 Tobacco use: Secondary | ICD-10-CM

## 2019-06-12 LAB — GLUCOSE, CAPILLARY
Glucose-Capillary: 113 mg/dL — ABNORMAL HIGH (ref 70–99)
Glucose-Capillary: 117 mg/dL — ABNORMAL HIGH (ref 70–99)
Glucose-Capillary: 118 mg/dL — ABNORMAL HIGH (ref 70–99)
Glucose-Capillary: 119 mg/dL — ABNORMAL HIGH (ref 70–99)
Glucose-Capillary: 162 mg/dL — ABNORMAL HIGH (ref 70–99)
Glucose-Capillary: 243 mg/dL — ABNORMAL HIGH (ref 70–99)

## 2019-06-12 LAB — URINE CULTURE: Culture: >=100000 — AB

## 2019-06-12 MED ORDER — CEPHALEXIN 250 MG PO CAPS
500.0000 mg | ORAL_CAPSULE | Freq: Four times a day (QID) | ORAL | Status: DC
  Administered 2019-06-12 – 2019-06-16 (×16): 500 mg via ORAL
  Filled 2019-06-12 (×17): qty 2

## 2019-06-12 NOTE — Progress Notes (Signed)
Speech Language Pathology Daily Session Note  Patient Details  Name: Scott Day MRN: [id number] Date of Birth: 12-07-1960  Today's Date: 06/12/2019 SLP Individual Time: 0814-0907 SLP Individual Time Calculation (min): 53 min  Short Term Goals: Week 1: SLP Short Term Goal 1 (Week 1): Patient will demonstrate efficient mastication and oral clerance of rgular textures over 2 observed sessions to demonstrate readiness for diet advancement. SLP Short Term Goal 2 (Week 1): Patient will complete higher level reasoning and executive functioning tasks (ex: medication and money management) with 90% accuracy and min cues. SLP Short Term Goal 3 (Week 1): Patient will demonstrate functional problem sovling for basic and familiar (mildly complex) tasks with min verbal cues. SLP Short Term Goal 4 (Week 1): Patient will utilize external memory aids to recall new, daily information with min verbal cues. SLP Short Term Goal 5 (Week 1): Patient will demonstrate sustained attention to functional task for 20 minutes with min verbal cues for redirection.  Skilled Therapeutic Interventions:Skilled treatment session targeted pt's cognition goals and education with pt's daughter. Currently pt demonstrates significantly decreased safety awareness, Max A for sustained attention to basic tasks in 2 minute intervals, Mod A for basic problem solving (putting one pill in designated area), and Max A to monitor impulsivity. Education provided to pt's daughter on safety risks within home environment, recommendation of 24/7 supervision and OPST. She voiced agreement and understanding. Pt handed off to PT.     Pain    Therapy/Group: Individual Therapy  Marquay Kruse Rutherford Nail 06/12/2019, 9:36 AM

## 2019-06-12 NOTE — Progress Notes (Signed)
Occupational Therapy Session Note  Patient Details  Name: KARLON RUMBLEY MRN: [id number] Date of Birth: Nov 23, 1960  Today's Date: 06/12/2019 OT Individual Time: [id number] OT Individual Time Calculation (min): 71 min    Short Term Goals: Week 1:  OT Short Term Goal 1 (Week 1): STGs=LTGs due to ELOS  Skilled Therapeutic Interventions/Progress Updates:    Pt began session with shower and dressing.  He was able to gather clothing and ambulate to the shower with supervision.  He needed increased time to realize that he had not picked up a pair of clean shorts to put on, but instead had picked up 2 shirts.  He was able to complete undressing in standing with supervision and then complete shower sit to stand from the bench.  He was able to then complete dressing sit to stand with supervision.  He was then able to stand at the sink for oral hygiene in sitting.  Next, had pt ambulate down to the [place] entrance of the hospital with supervision.  Mod instructional cueing for locating objects on the left side such as the hand sanitizer.  He was able to negotiate 4 flights of steps around at the parking garage with supervision.  When coming back upstairs he needed mod instructional cueing to locate his room using external aides as he initially went the wrong way and was not aware of it.  Therapist tried to get him to locate the signs left of the hallway to help correct him, but he did not see them initially.  Therapist had to point them out.  Once back in the room, he was left sitting EOB with supervision with his daughter present working on eating his lunch.    Therapy Documentation Precautions:  Precautions Precautions: Fall Precaution Comments: safety, impulsive. Restrictions Weight Bearing Restrictions: No General:   Vital Signs: Therapy Vitals Temp: 98.2 F (36.8 C) Temp Source: Oral Pulse Rate: 82 Resp: 17 BP: 124/65 Patient Position (if appropriate): Lying Oxygen Therapy SpO2: 96 % O2  Device: Room Air Pain: Pain Assessment Pain Scale: Faces Pain Score: 0-No pain ADL: ADL Eating: Set up Grooming: Supervision/safety Where Assessed-Grooming: Standing at sink Upper Body Bathing: Supervision/safety Where Assessed-Upper Body Bathing: Shower Lower Body Bathing: Supervision/safety Where Assessed-Lower Body Bathing: Shower Upper Body Dressing: Supervision/safety Where Assessed-Upper Body Dressing: Edge of bed Lower Body Dressing: Supervision/safety Where Assessed-Lower Body Dressing: Edge of bed Toileting: Supervision/safety Where Assessed-Toileting: Glass blower/designer: Close supervision Armed forces technical officer Method: Magazine features editor: Close supervision Social research officer, government Method: Heritage manager: Civil engineer, contracting with back Glass blower/designer   Exercises:   Other Treatments:     Therapy/Group: Individual Therapy  Marcellene Shivley,Cornel OTR/L 06/12/2019, 3:45 PM

## 2019-06-12 NOTE — Progress Notes (Addendum)
Pt states that he has had a bowel movement on yesterday, however no one knew that he had one because he was in the room alone. This Probation officer educated pt that he needs to call and ask for help when going to the bathroom because that is protocol.  Pt is also turning off his bed alarm by himself and not letting a nurse or a tech help him. Also educated pt on understand the risk of falls when alone.

## 2019-06-12 NOTE — Progress Notes (Signed)
Physical Therapy Session Note  Patient Details  Name: Scott Day MRN: [id number] Date of Birth: 04-Mar-1961  Today's Date: 06/12/2019 PT Individual Time: 0907-1019 PT Individual Time Calculation (min): 72 min   Short Term Goals: Week 1:  PT Short Term Goal 1 (Week 1): STG=LTGs due to LOS  Skilled Therapeutic Interventions/Progress Updates:   Pt received supine in bed with Happi, SLP present and pt's daughter, [name] present. Pt agreeable to therapy session but states he is agitated due to not having received his nicotine patch yet - RN notified and once out of room able to redirect patient. Supine>sit with supervision. Sit<>stands, no AD, with close supervision for safety during session. Gait training ~155ft to main therapy gym, no AD, with close supervision/CGA for safety.  Participated in the following dynamic balance tasks targeting L attention and dual-tasks with cognitive challenge: - 5 cone taps with CGA for steadying - 4 square taps to external numbered targets with the following cognitive progression challenges: turning 90degrees and recalling location of targets without visual input, tapping targets to add to a specific number then performing same task but with subtraction - standing on rocker board in // bars with B UE support progress to no UE support with R/L lateral weight shifts with CGA for safety - standing on BOSU ball (black side) in // bars with B UE support progressed to no UE support during static standing with CGA for steadying and pt demonstrating significant difficulty with this - standing on BOSU ball (blue side up) in // bars with B UE support progressed to no UE support able to maintain static standing slightly better than on other side still CGA for safety - agility ladder drills: forward stepping, forward stepping skipping a block, lateral side stepping, grapevine only stepping in front, forward 2 feet in 2 feet out (pt having increased difficulty motor planning  this task), backwards stepping 2 feet per square progressed to 1 foot per square - requires min assist for balance during backwards walking but otherwise able to perform with CGA - ball kicking with 2nd person with CGA for safety/steadying Gait training ~157ft back to room, no AD, with CGA/close supervision for safety. Sit>supine with supervision. Pt left supine in bed with needs in reach, bed alarm on, and his daughter present.   Therapy Documentation Precautions:  Precautions Precautions: Fall Precaution Comments: safety, impulsive. Restrictions Weight Bearing Restrictions: No  Pain:  Denies pain during session.   Therapy/Group: Individual Therapy  Tawana Scale, PT, DPT 06/12/2019, 7:54 AM

## 2019-06-12 NOTE — Progress Notes (Signed)
[facility] PHYSICAL MEDICINE & REHABILITATION PROGRESS NOTE   Subjective/Complaints: Patient seen laying in bed this AM.  He states he slept well overnight, but does not feel this AM, no particular symptoms.  He asks for a nicotine patch.  ROS: Denies CP, SOB N/V/D  Objective:   No results found. Recent Labs    06/11/19 0543  WBC 10.9*  HGB 15.1  HCT 44.5  PLT 236   Recent Labs    06/11/19 0543  NA 137  K 3.8  CL 101  CO2 25  GLUCOSE 133*  BUN 10  CREATININE 1.07  CALCIUM 9.0    Intake/Output Summary (Last 24 hours) at 06/12/2019 0930 Last data filed at 06/12/2019 0836 Gross per 24 hour  Intake 360 ml  Output --  Net 360 ml     Physical Exam: Vital Signs Blood pressure 108/77, pulse 75, temperature (!) 97.4 F (36.3 C), temperature source Oral, resp. rate 19, SpO2 99 %. Constitutional: No distress . Vital signs reviewed. HENT: Normocephalic.  Atraumatic. Eyes: EOMI. No discharge. Cardiovascular: No JVD. Respiratory: Normal effort.  No stridor. GI: Non-distended. Skin: Warm and dry.  Intact. Psych: Normal mood.  Normal behavior. Musc: No edema in extremities.  No tenderness in extremities. Neurologic: Alert Motor:  LUE/LLE: 4+-5/5 proximal distal, unchanged No ataxia  Assessment/Plan: 1. 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  Physiatrist is providing close team supervision and 24 hour management of active medical problems listed below.  Physiatrist and rehab team continue to assess barriers to discharge/monitor patient progress toward functional and medical goals  Care Tool:  Bathing    Body parts bathed by patient: Right arm, Left arm, Chest, Abdomen, Front perineal area, Buttocks, Right upper leg, Left upper leg, Right lower leg, Left lower leg, Face         Bathing assist Assist Level: Supervision/Verbal cueing     Upper  Body Dressing/Undressing Upper body dressing   What is the patient wearing?: Pull over shirt    Upper body assist Assist Level: Supervision/Verbal cueing    Lower Body Dressing/Undressing Lower body dressing      What is the patient wearing?: Underwear/pull up, Pants     Lower body assist Assist for lower body dressing: Supervision/Verbal cueing     Toileting Toileting    Toileting assist Assist for toileting: Supervision/Verbal cueing     Transfers Chair/bed transfer  Transfers assist     Chair/bed transfer assist level: Supervision/Verbal cueing     Locomotion Ambulation   Ambulation assist      Assist level: Supervision/Verbal cueing Assistive device: No Device Max distance: >1000 ft   Walk 10 feet activity   Assist     Assist level: Supervision/Verbal cueing Assistive device: No Device   Walk 50 feet activity   Assist    Assist level: Supervision/Verbal cueing Assistive device: No Device    Walk 150 feet activity   Assist    Assist level: Supervision/Verbal cueing Assistive device: No Device    Walk 10 feet on uneven surface  activity   Assist     Assist level: Minimal Assistance - Patient > 75% Assistive device: Other (comment)(none)   Wheelchair     Assist Will patient use wheelchair at discharge?: No Type of Wheelchair: Manual Wheelchair activity did not occur: N/A         Wheelchair 50 feet with 2 turns activity  Assist    Wheelchair 50 feet with 2 turns activity did not occur: N/A       Wheelchair 150 feet activity     Assist  Wheelchair 150 feet activity did not occur: N/A       Blood pressure 108/77, pulse 75, temperature (!) 97.4 F (36.3 C), temperature source Oral, resp. rate 19, SpO2 99 %.    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 Status post loop  recorder  Continue CIR 2. Antithrombotics: -DVT/anticoagulation:SCDs.Lower extremity Dopplers negative. -antiplatelet therapy: Aspirin 81 mg daily, Brilinta 90 mg twice daily 3. Pain Management:Tylenol as needed 4. Mood:Provide emotional support -antipsychotic agents: N/A 5. Neuropsych: This patientiscapable of making decisions on hisown behalf. 6. Skin/Wound Care:Routine skin checks 7. Fluids/Electrolytes/Nutrition:Routine in and outs 8. Hypertension. On no antihypertensive medication prior to admission. Monitor with increased mobility  Controlled on 3/23 9. Hyperlipidemia. Lipitor 10. New findings of diabetes mellitus. Hemoglobin A1c 8.3. SSI. Diabetic teaching CBG (last 3)  Recent Labs    06/12/19 0037 06/12/19 0417 06/12/19 0807  GLUCAP 119* 117* 243*   Metformin 250 daily started 3/22  Labile on 3/23, monitor for trend 11.  E. coli UTI  Sensitivities pending  Empiric Macrobid started on 3/22, changed to keflex on 3/23 12.  Post stroke dysphagia  D3 thins, advance diet as tolerated 13.  Tobacco abuse  Nicotine patch ordered  LOS: 3 days A FACE TO FACE EVALUATION WAS PERFORMED  Benno Brensinger Lorie Phenix 06/12/2019, 9:30 AM

## 2019-06-12 NOTE — Plan of Care (Signed)
  Problem: RH Problem Solving Goal: LTG Patient will demonstrate problem solving for (SLP) Description: LTG:  Patient will demonstrate problem solving for basic/complex daily situations with cues  (SLP) Flowsheets (Taken 06/12/2019 1200) LTG Patient will demonstrate problem solving for:  Minimal Assistance - Patient > 75%  Moderate Assistance - Patient 50 - 74% Note: Downgraded d/t slower than anticipated progress   Problem: RH Memory Goal: LTG Patient will use memory compensatory aids to (SLP) Description: LTG:  Patient will use memory compensatory aids to recall biographical/new, daily complex information with cues (SLP) Flowsheets (Taken 06/12/2019 1200) LTG: Patient will use memory compensatory aids to (SLP):  Minimal Assistance - Patient > 75%  Moderate Assistance - Patient 50 - 74% Note: Downgraded d/t slower than anticipated progress   Problem: RH Attention Goal: LTG Patient will demonstrate this level of attention during functional activites (SLP) Description: LTG:  Patient will will demonstrate this level of attention during functional activites (SLP) Flowsheets (Taken 06/12/2019 1200) Patient will demonstrate during cognitive/linguistic activities the attention type of: Sustained LTG: Patient will demonstrate this level of attention during cognitive/linguistic activities with assistance of (SLP): Moderate Assistance - Patient 50 - 74% Number of minutes patient will demonstrate attention during cognitive/linguistic activities: 10 minutes Note: Downgraded d/t slower than anticipated progress

## 2019-06-12 NOTE — Plan of Care (Signed)
  Problem: Consults Goal: RH STROKE PATIENT EDUCATION Description: See Patient Education module for education specifics  Outcome: Progressing Goal: Diabetes Guidelines if Diabetic/Glucose > 140 Description: If diabetic or lab glucose is > 140 mg/dl - Initiate Diabetes/Hyperglycemia Guidelines & Document Interventions  Outcome: Progressing   Problem: RH BOWEL ELIMINATION Goal: RH STG MANAGE BOWEL WITH ASSISTANCE Description: STG Manage Bowel with min Assistance. Outcome: Progressing Goal: RH STG MANAGE BOWEL W/MEDICATION W/ASSISTANCE Description: STG Manage Bowel with Medication with min Assistance. Outcome: Progressing   Problem: RH SKIN INTEGRITY Goal: RH STG MAINTAIN SKIN INTEGRITY WITH ASSISTANCE Description: STG Maintain Skin Integrity With min Assistance. Outcome: Progressing   Problem: RH SAFETY Goal: RH STG ADHERE TO SAFETY PRECAUTIONS W/ASSISTANCE/DEVICE Description: STG Adhere to Safety Precautions With supervision Assistance/Device. Outcome: Progressing   Problem: RH KNOWLEDGE DEFICIT Goal: RH STG INCREASE KNOWLEDGE OF DIABETES Description: Pt/family will be able to manage DM with diet control and medication regimen using handouts and booklets prior to DC with mod I assist Outcome: Progressing Goal: RH STG INCREASE KNOWLEDGE OF STROKE PROPHYLAXIS Description: Pt/family will be able to manage stroke prevention with diet control and medication regimen using handouts and booklets prior to DC with mod I assist  Outcome: Progressing   Problem: RH KNOWLEDGE DEFICIT Goal: RH STG INCREASE KNOWLEGDE OF HYPERLIPIDEMIA Description: Pt/family will be able to manage HLD with diet control and medication regimen using handouts and booklets prior to DC with mod I assist  Outcome: Progressing

## 2019-06-13 ENCOUNTER — Inpatient Hospital Stay (HOSPITAL_COMMUNITY): Payer: Self-pay | Admitting: Speech Pathology

## 2019-06-13 ENCOUNTER — Inpatient Hospital Stay (HOSPITAL_COMMUNITY): Payer: Self-pay

## 2019-06-13 ENCOUNTER — Inpatient Hospital Stay (HOSPITAL_COMMUNITY): Payer: Self-pay | Admitting: Occupational Therapy

## 2019-06-13 DIAGNOSIS — D72829 Elevated white blood cell count, unspecified: Secondary | ICD-10-CM

## 2019-06-13 LAB — GLUCOSE, CAPILLARY
Glucose-Capillary: 111 mg/dL — ABNORMAL HIGH (ref 70–99)
Glucose-Capillary: 138 mg/dL — ABNORMAL HIGH (ref 70–99)
Glucose-Capillary: 146 mg/dL — ABNORMAL HIGH (ref 70–99)
Glucose-Capillary: 184 mg/dL — ABNORMAL HIGH (ref 70–99)
Glucose-Capillary: 91 mg/dL (ref 70–99)

## 2019-06-13 MED ORDER — INSULIN ASPART 100 UNIT/ML ~~LOC~~ SOLN
0.0000 [IU] | Freq: Three times a day (TID) | SUBCUTANEOUS | Status: DC
  Administered 2019-06-13: 3 [IU] via SUBCUTANEOUS
  Administered 2019-06-13 (×2): 2 [IU] via SUBCUTANEOUS
  Administered 2019-06-14: 3 [IU] via SUBCUTANEOUS
  Administered 2019-06-14 – 2019-06-16 (×6): 2 [IU] via SUBCUTANEOUS

## 2019-06-13 NOTE — Progress Notes (Signed)
Occupational Therapy Session Note  Patient Details  Name: Scott Day MRN: [id number] Date of Birth: [birth year]  Today's Date: 06/13/2019 OT Individual Time: 0945-1100 OT Individual Time Calculation (min): 75 min    Short Term Goals: Week 1:  OT Short Term Goal 1 (Week 1): STGs=LTGs due to ELOS  Skilled Therapeutic Interventions/Progress Updates:    Pt received EOB playing his guitar.  Pt doing quite well with the guitar but he noticed his L [facility] was not as strong.  Discussed going to the therapy room to focus on [facility].    Chaplain and witnesses arrived to have pt sign his POA papers.  Supervised pt staying on track with task as he is very talkative and wanted to talk about lots of different topics unrelated to task. He was fully aware of what he was doing and that he was giving POA to his daughter.    Pt went back to the guitar and needed to be redirected several times to move onto other tasks as he can practice guitar on his own.  Ambulated to the gym and worked on a pipe tree design needing several cues to find the right pieces mostly on the L side of the design.  Mod impaired problem solving when he had to figure out how to switch out some of the pieces.  When trying to educate patient on L visual field deficits or L visual field awareness, pt often jokes and switches topics.   He worked on a very small [facility] task with L hand completing well.  He then sanitized all pieces of pipe tree puzzle with cleansing cloth..  Ambulated back to room to sit to EOB. Bed alarm on and all needs met.   Therapy Documentation Precautions:  Precautions Precautions: Fall Precaution Comments: safety, impulsive. Restrictions Weight Bearing Restrictions: No   Pain: Pain Assessment Pain Score: 0-No pain ADL: ADL Eating: Set up Grooming: Supervision/safety Where Assessed-Grooming: Standing at sink Upper Body Bathing: Supervision/safety Where Assessed-Upper Body Bathing: Shower Lower Body Bathing:  Supervision/safety Where Assessed-Lower Body Bathing: Shower Upper Body Dressing: Supervision/safety Where Assessed-Upper Body Dressing: Edge of bed Lower Body Dressing: Supervision/safety Where Assessed-Lower Body Dressing: Edge of bed Toileting: Supervision/safety Where Assessed-Toileting: Glass blower/designer: Close supervision Armed forces technical officer Method: Magazine features editor: Close supervision Social research officer, government Method: Heritage manager: Shower seat with back   Therapy/Group: Individual Therapy  [place] 06/13/2019, 1:04 PM

## 2019-06-13 NOTE — Plan of Care (Signed)
  Problem: Consults Goal: RH STROKE PATIENT EDUCATION Description: See Patient Education module for education specifics  Outcome: Progressing Goal: Diabetes Guidelines if Diabetic/Glucose > 140 Description: If diabetic or lab glucose is > 140 mg/dl - Initiate Diabetes/Hyperglycemia Guidelines & Document Interventions  Outcome: Progressing   Problem: RH BOWEL ELIMINATION Goal: RH STG MANAGE BOWEL WITH ASSISTANCE Description: STG Manage Bowel with min Assistance. Outcome: Progressing Goal: RH STG MANAGE BOWEL W/MEDICATION W/ASSISTANCE Description: STG Manage Bowel with Medication with min Assistance. Outcome: Progressing   Problem: RH SKIN INTEGRITY Goal: RH STG MAINTAIN SKIN INTEGRITY WITH ASSISTANCE Description: STG Maintain Skin Integrity With min Assistance. Outcome: Progressing   Problem: RH SAFETY Goal: RH STG ADHERE TO SAFETY PRECAUTIONS W/ASSISTANCE/DEVICE Description: STG Adhere to Safety Precautions With supervision Assistance/Device. Outcome: Progressing   Problem: RH KNOWLEDGE DEFICIT Goal: RH STG INCREASE KNOWLEDGE OF DIABETES Description: Pt/family will be able to manage DM with diet control and medication regimen using handouts and booklets prior to DC with mod I assist Outcome: Progressing Goal: RH STG INCREASE KNOWLEDGE OF STROKE PROPHYLAXIS Description: Pt/family will be able to manage stroke prevention with diet control and medication regimen using handouts and booklets prior to DC with mod I assist  Outcome: Progressing   Problem: RH KNOWLEDGE DEFICIT Goal: RH STG INCREASE KNOWLEGDE OF HYPERLIPIDEMIA Description: Pt/family will be able to manage HLD with diet control and medication regimen using handouts and booklets prior to DC with mod I assist  Outcome: Progressing

## 2019-06-13 NOTE — Progress Notes (Signed)
Speech Language Pathology Daily Session Note  Patient Details  Name: SUJAN BAZER MRN: [id number] Date of Birth: 1960-03-27  Today's Date: 06/13/2019 SLP Individual Time: 0720-0730 SLP Individual Time Calculation (min): 10 min; missed 35 minutes d/t inability to maintain alertness  Short Term Goals: Week 1: SLP Short Term Goal 1 (Week 1): Patient will demonstrate efficient mastication and oral clerance of rgular textures over 2 observed sessions to demonstrate readiness for diet advancement. SLP Short Term Goal 2 (Week 1): Patient will complete higher level reasoning and executive functioning tasks (ex: medication and money management) with 90% accuracy and min cues. SLP Short Term Goal 3 (Week 1): Patient will demonstrate functional problem sovling for basic and familiar (mildly complex) tasks with min verbal cues. SLP Short Term Goal 4 (Week 1): Patient will utilize external memory aids to recall new, daily information with min verbal cues. SLP Short Term Goal 5 (Week 1): Patient will demonstrate sustained attention to functional task for 20 minutes with min verbal cues for redirection.  Skilled Therapeutic Interventions:  SLP recieved pt in bed asleep with his legs hanging off of bed to his left. Pt with brief moments of response (mostly grunting). SLP had to physically move pt's legs for safety and bed rail raised. Despite Total A multimodal cues, pt was not able to maintain alertness. Pt missed 35 minutes of skilled ST d/t fatigue.      Pain    Therapy/Group: Individual Therapy  Michaelann Gunnoe 06/13/2019, 11:17 AM

## 2019-06-13 NOTE — Progress Notes (Signed)
Physical Therapy Session Note  Patient Details  Name: SIMMIE STEIGHNER MRN: [id number] Date of Birth: [birth year]  Today's Date: 06/13/2019 PT Individual Time: [id number] PT Individual Time Calculation (min): 69 min   Short Term Goals: Week 1:  PT Short Term Goal 1 (Week 1): STG=LTGs due to LOS  Skilled Therapeutic Interventions/Progress Updates:   Pt received in supine and agreeable to therapy. No report of pain. Bed mobility independent Sit to stand with supervision and verbal cues for maintaining upright gaze to improve posture and balance. Pt ambulates >1000' with supervision and no AD. Ambulating outside for community reintegration, including ambulation on ramps as well as uneven, rough terrain for balance challenge. Pt ambulates up 4 flights of stairs with LHR, requiring brief rest break at landings and cues for energy conservation. PT provides cues during gait to challenge dynamic balance with pt demonstrating increased postural sway during lateral head turns.  NM Re-ed: Pt performs balance and cognitive challenge with ladder activity. Performing forward and backward ambulation in ladder, given multi-step tasks including stepping in and out of ladder with sequencing of steps. Pt requires frequent verbal cues when given multi-step tasks. CGA provided but no overt LOBs.  Pt attempts biodex x10 mins and has difficulty performing adequate weight shifts to complete challenges. Frequently attempts to move feet and unable to incorporate concept of fluid weight shifts with hips and shoulders.   Pt left supine in bed with alarm intact and all needs within reach.   Precautions:  Precautions Precautions: Fall Precaution Comments: safety, impulsive. Restrictions Weight Bearing Restrictions: No   Therapy/Group: Individual Therapy  Breck Coons, PT, DPT 06/13/2019, 4:03 PM

## 2019-06-13 NOTE — Plan of Care (Signed)
  Problem: Consults Goal: RH STROKE PATIENT EDUCATION Description: See Patient Education module for education specifics  Outcome: Progressing   Problem: RH BOWEL ELIMINATION Goal: RH STG MANAGE BOWEL WITH ASSISTANCE Description: STG Manage Bowel with min Assistance. Outcome: Progressing

## 2019-06-13 NOTE — Progress Notes (Signed)
[place] PHYSICAL MEDICINE & REHABILITATION PROGRESS NOTE   Subjective/Complaints: Patient seen sitting up, working with therapies this AM.  He states he slept well overnight.  Discussed visual deficits with therapies.  Patient notes improvement in vague symptoms from yesterday.   ROS: Denies CP, SOB N/V/D  Objective:   No results found. Recent Labs    06/11/19 0543  WBC 10.9*  HGB 15.1  HCT 44.5  PLT 236   Recent Labs    06/11/19 0543  NA 137  K 3.8  CL 101  CO2 25  GLUCOSE 133*  BUN 10  CREATININE 1.07  CALCIUM 9.0    Intake/Output Summary (Last 24 hours) at 06/13/2019 1340 Last data filed at 06/13/2019 1025 Gross per 24 hour  Intake 360 ml  Output --  Net 360 ml     Physical Exam: Vital Signs Blood pressure 102/73, pulse 71, temperature 98 F (36.7 C), resp. rate 18, SpO2 98 %. Constitutional: No distress . Vital signs reviewed. HENT: Normocephalic.  Atraumatic. Eyes: EOMI. No discharge. Cardiovascular: No JVD. Respiratory: Normal effort.  No stridor. GI: Non-distended. Skin: Warm and dry.  Intact. Psych: Normal mood.  Normal behavior. Musc: No edema in extremities.  No tenderness in extremities. Neurologic: Alert Motor:  LUE/LLE: 4+-5/5 proximal distal, improving  Assessment/Plan: 1. 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  Physiatrist is providing close team supervision and 24 hour management of active medical problems listed below.  Physiatrist and rehab team continue to assess barriers to discharge/monitor patient progress toward functional and medical goals  Care Tool:  Bathing    Body parts bathed by patient: Right arm, Left arm, Chest, Abdomen, Front perineal area, Buttocks, Right upper leg, Left upper leg, Right lower leg, Left lower leg, Face         Bathing assist Assist Level: Supervision/Verbal cueing     Upper  Body Dressing/Undressing Upper body dressing   What is the patient wearing?: Pull over shirt    Upper body assist Assist Level: Supervision/Verbal cueing    Lower Body Dressing/Undressing Lower body dressing      What is the patient wearing?: Underwear/pull up, Pants     Lower body assist Assist for lower body dressing: Supervision/Verbal cueing     Toileting Toileting    Toileting assist Assist for toileting: Supervision/Verbal cueing     Transfers Chair/bed transfer  Transfers assist     Chair/bed transfer assist level: Supervision/Verbal cueing     Locomotion Ambulation   Ambulation assist      Assist level: Supervision/Verbal cueing Assistive device: No Device Max distance: 110ft   Walk 10 feet activity   Assist     Assist level: Supervision/Verbal cueing Assistive device: No Device   Walk 50 feet activity   Assist    Assist level: Supervision/Verbal cueing Assistive device: No Device    Walk 150 feet activity   Assist    Assist level: Supervision/Verbal cueing Assistive device: No Device    Walk 10 feet on uneven surface  activity   Assist     Assist level: Minimal Assistance - Patient > 75% Assistive device: Other (comment)(none)   Wheelchair     Assist Will patient use wheelchair at discharge?: No Type of Wheelchair: Manual Wheelchair activity did not occur: N/A         Wheelchair 50 feet with 2 turns activity    Assist  Wheelchair 50 feet with 2 turns activity did not occur: N/A       Wheelchair 150 feet activity     Assist  Wheelchair 150 feet activity did not occur: N/A       Blood pressure 102/73, pulse 71, temperature 98 F (36.7 C), resp. rate 18, SpO2 98 %.    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 Status post loop recorder  Continue CIR  Team  conference today to discuss current and goals and coordination of care, home and environmental barriers, and discharge planning with nursing, case manager, and therapies.  2. Antithrombotics: -DVT/anticoagulation:SCDs.Lower extremity Dopplers negative. -antiplatelet therapy: Aspirin 81 mg daily, Brilinta 90 mg twice daily 3. Pain Management:Tylenol as needed 4. Mood:Provide emotional support -antipsychotic agents: N/A 5. Neuropsych: This patientiscapable of making decisions on hisown behalf. 6. Skin/Wound Care:Routine skin checks 7. Fluids/Electrolytes/Nutrition:Routine in and outs 8. Hypertension. On no antihypertensive medication prior to admission. Monitor with increased mobility  Controlled on 3/24 9. Hyperlipidemia. Lipitor 10. New findings of diabetes mellitus. Hemoglobin A1c 8.3. SSI. Diabetic teaching CBG (last 3)  Recent Labs    06/13/19 0029 06/13/19 0459 06/13/19 1137  GLUCAP 91 111* 184*   Metformin 250 daily started 3/22  Labile on 3/24, monitor for trend 11.  E. coli UTI  Empiric Macrobid started on 3/22, changed to keflex on 3/23 12.  Post stroke dysphagia  D3 thins, advance diet as tolerated 13.  Tobacco abuse  Nicotine patch 14. Leukocytosis  WBCs 10.9 on 3/22  Afebrile  Cont to monitor  LOS: 4 days A FACE TO FACE EVALUATION WAS PERFORMED  Kile Kabler Lorie Phenix 06/13/2019, 1:40 PM

## 2019-06-13 NOTE — Patient Care Conference (Signed)
Inpatient RehabilitationTeam Conference and Plan of Care Update Date: 06/13/2019   Time: 11:00 AM    Patient Name: Scott Day      Medical Record Number: [id number]  Date of Birth: Nov 08, 1960 Sex: Male         Room/Bed: 4W22C/4W22C-01 Payor Info: Payor: MEDICAID POTENTIAL / Plan: MEDICAID POTENTIAL / Product Type: *No Product type* /    Admit Date/Time:  06/09/2019  5:09 PM  Primary Diagnosis:  Acute ischemic cerebrovascular accident (CVA) involving right middle cerebral artery territory [facility])  Patient Active Problem List   Diagnosis Date Noted  . Leukocytosis   . Tobacco abuse   . Dysphagia, post-stroke   . E. coli UTI   . Diabetes mellitus, new onset ([place])   . 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 Cerebrovascular accident (CVA) due to thrombosis of right middle cerebral artery ([place])   . Essential hypertension   . Oropharyngeal dysphagia   . Left hemiparesis ([place])   . Class 1 obesity due to excess calories with serious comorbidity and body mass index (BMI) of 32.0 to 32.9 in adult   . Acute ischemic cerebrovascular accident (CVA) involving right middle cerebral artery territory ([place]) 06/04/2019  . Arterial ischemic stroke, MCA, right, acute ([place]) 06/04/2019    Expected Discharge Date: Expected Discharge Date: 06/16/19  Team Members Present: Physician leading conference: Dr. Delice Lesch Care Coodinator Present: Nestor Lewandowsky, RN, BSN, CRRN;Genie Cassiel Fernandez, RN, MSN Nurse Present: Ellison Carwin, LPN PT Present: Michaelene Song, PT OT Present: Meriel Pica, OT SLP Present: Stormy Fabian, SLP PPS Coordinator present : Ileana Ladd, Burna Mortimer, SLP     Current Status/Progress Goal Weekly Team Focus  Bowel/Bladder   continent of B&B LMB- 3/22  continue to be continent  assess B&B qshift and prn   Swallow/Nutrition/ Hydration   dysphagia 3  Supervision  pt frequently declines meals d/t overall dislike   ADL's   supervision with basic self care  and mod cues to stay on task, problem solving, min cues for L visual field awareness; CGA with dynamic balance on unstable surfaces  supervision overall (due to decreased cognitive skills)  ADL training, functional mobility, balance, cognitive activities focusing on attention, awareness, problem solving   Mobility   Supervision for bed mobility, transfers, and ambulation >1000' without AD. MinA for 10' ambulation over uneven surfaces. CGA for 8 steps with 1 hand rail.  Independent for transfers, dynamic standing balance. Supervision 400' and 2 6" steps.  Dynamic balance with incorporation of cognitive tasks. Multistep challenges and motor planning. Activity tolerance. Pt education. Dynamic gait.   Communication             Safety/Cognition/ Behavioral Observations  Max A  Mod A to Min A - downgraded 06/12/19  sustained attention, intellectual awareness, problem solving   Pain   pt c/o of pain prn tyneol helps  keep pain level at a 3  assess pain qshift and prn   Skin   no visible skin impairments  remain free of skin breakdown  assess sking qshift or prn    Rehab Goals Patient on target to meet rehab goals: Yes *See Care Plan and progress notes for long and short-term goals.     Barriers to Discharge  Current Status/Progress Possible Resolutions Date Resolved   Nursing                  PT  Lack of/limited family support  safety issues.  OT Medical stability                SLP                SW Decreased caregiver support;New diabetic;Medication compliance New DM and reluctant to change diet (likes lots of salt) PCP referral made; appointment set up with [place] and [place] came to speak with patient and daughter          Discharge Planning/Teaching Needs:  Home with daughter and son assisting Prn  TBD, medications , new dm, transfers, toileting, safety, etc.   Team Discussion: MD med adjustments, new diabetic, metformin started, UTI, med  started.  RN cont B/B, BM 3/22.  OT S goals, at goal level, distracted easily, playing his guitar.  PT S bed/transfers, amb 1000' no device, CGA 8 steps with 1 hand rail, goals S amb.  SLP D3thins, max A cognition.  Dtr present yesterday, will need 24/7 care from family.   Revisions to Treatment Plan: N/A     Medical Summary Current Status: Left-sided weakness with dysarthria secondary to right MCA territory infarction with right M1 distal occlusion status post stenting Weekly Focus/Goal: Improve mobility, CBGs, BP, UTI, dysphagia, tobacco abuse  Barriers to Discharge: Medical stability;New diabetic   Possible Resolutions to Barriers: Therapies, abx, advance diet as swallowing, nicotine patch   Continued Need for Acute Rehabilitation Level of Care: The patient requires daily medical management by a physician with specialized training in physical medicine and rehabilitation for the following reasons: Direction of a multidisciplinary physical rehabilitation program to maximize functional independence : Yes Medical management of patient stability for increased activity during participation in an intensive rehabilitation regime.: Yes Analysis of laboratory values and/or radiology reports with any subsequent need for medication adjustment and/or medical intervention. : Yes   I attest that I was present, lead the team conference, and concur with the assessment and plan of the team.   Retta Diones 06/13/2019, 5:13 PM   Team conference was held via web/ teleconference due to [place] - 19

## 2019-06-13 NOTE — Progress Notes (Signed)
Chaplain responded to Spiritual Consult and notarized Advance Directive as requested.  De Burrs Chaplain Resident

## 2019-06-14 ENCOUNTER — Inpatient Hospital Stay (HOSPITAL_COMMUNITY): Payer: Self-pay | Admitting: Physical Therapy

## 2019-06-14 ENCOUNTER — Inpatient Hospital Stay (HOSPITAL_COMMUNITY): Payer: Self-pay | Admitting: Speech Pathology

## 2019-06-14 ENCOUNTER — Inpatient Hospital Stay (HOSPITAL_COMMUNITY): Payer: Self-pay | Admitting: Occupational Therapy

## 2019-06-14 DIAGNOSIS — R7309 Other abnormal glucose: Secondary | ICD-10-CM

## 2019-06-14 LAB — GLUCOSE, CAPILLARY
Glucose-Capillary: 129 mg/dL — ABNORMAL HIGH (ref 70–99)
Glucose-Capillary: 104 mg/dL — ABNORMAL HIGH (ref 70–99)
Glucose-Capillary: 154 mg/dL — ABNORMAL HIGH (ref 70–99)
Glucose-Capillary: 127 mg/dL — ABNORMAL HIGH (ref 70–99)

## 2019-06-14 MED ORDER — ACETAMINOPHEN 325 MG PO TABS: 650.0000 mg | ORAL_TABLET | ORAL | Status: DC | PRN

## 2019-06-14 MED ORDER — METFORMIN HCL 500 MG PO TABS: 250.0000 mg | ORAL_TABLET | Freq: Every day | ORAL | 0 refills | Status: DC

## 2019-06-14 MED ORDER — TICAGRELOR 90 MG PO TABS: 90.0000 mg | ORAL_TABLET | Freq: Two times a day (BID) | ORAL | 1 refills | Status: DC

## 2019-06-14 MED ORDER — ATORVASTATIN CALCIUM 80 MG PO TABS: 80.0000 mg | ORAL_TABLET | Freq: Every day | ORAL | 0 refills | Status: DC

## 2019-06-14 MED ORDER — CEPHALEXIN 500 MG PO CAPS: 500.0000 mg | ORAL_CAPSULE | Freq: Four times a day (QID) | ORAL | 0 refills | Status: DC

## 2019-06-14 MED ORDER — NICOTINE 14 MG/24HR TD PT24: MEDICATED_PATCH | TRANSDERMAL | 0 refills | Status: DC

## 2019-06-14 NOTE — Discharge Summary (Addendum)
Physician Discharge Summary  Patient ID: LAYMOND POSTLE MRN: [id number] DOB/AGE: Sep 19, 1960 59 y.o.  Admit date: 06/09/2019 Discharge date: 06/16/2019  Discharge Diagnoses:  Principal Problem:   Acute ischemic cerebrovascular accident (CVA) involving right middle cerebral artery territory [facility]) Active Problems:   Right middle cerebral artery stroke (HCC)   Dysphagia, post-stroke   E. coli UTI   Diabetes mellitus, new onset ([place])   Tobacco abuse   Leukocytosis   Labile blood glucose Hypertension History of colon cancer Hyperlipidemia  Discharged Condition: Stable  Significant Diagnostic Studies: CT ANGIO HEAD W OR WO CONTRAST  Addendum Date: 06/04/2019   ADDENDUM REPORT: 06/04/2019 12:21 ADDENDUM: Contrast dose is 100 mL Omnipaque 350 Electronically Signed   By: Macy Mis M.D.   On: 06/04/2019 12:21   Result Date: 06/04/2019 CLINICAL DATA:  Code stroke.  Slurred speech and left facial droop EXAM: CT ANGIOGRAPHY HEAD AND NECK CT PERFUSION BRAIN TECHNIQUE: Multidetector CT imaging of the head and neck was performed using the standard protocol during bolus administration of intravenous contrast. Multiplanar CT image reconstructions and MIPs were obtained to evaluate the vascular anatomy. Carotid stenosis measurements (when applicable) are obtained utilizing NASCET criteria, using the distal internal carotid diameter as the denominator. Multiphase CT imaging of the brain was performed following IV bolus contrast injection. Subsequent parametric perfusion maps were calculated using RAPID software. COMPARISON:  None. FINDINGS: CT HEAD Brain: There is no acute intracranial hemorrhage mass effect edema. Gray-white differentiation is preserved. Ventricles and sulci normal in size and configuration. There is no extra-axial fluid collection. Vascular: Hyperdense vessel. Skull: Unremarkable. Sinuses/Orbits: Lobular mucosal thickening with greatest involvement of the right maxillary sinus. No acute  orbital finding. Other: Mastoid air cells are clear ASPECTS [facility] Stroke Program Early CT Score) - Ganglionic level infarction (caudate, lentiform nuclei, internal capsule, insula, M1-M3 cortex): 7 - Supraganglionic infarction (M4-M6 cortex): 3 Total score (0-10 with 10 being normal): 10 Review of the MIP images confirms the above findings CTA NECK Aortic arch: Great vessel origins are patent. Right carotid system: Patent. There is trace calcified plaque at the ICA origin measurable stenosis. Left carotid system: Patent. Eccentric noncalcified plaque causing less than 50% stenosis. Mild calcified plaque at the ICA origin causing less than 50% stenosis. Vertebral arteries: Patent.  Left vertebral artery dominant. Skeleton: Cervical spine degenerative changes. Other neck: No mass or adenopathy Upper chest: No apical lung mass. Review of the MIP images confirms the above findings CTA HEAD Anterior circulation: Intracranial internal carotid arteries patent. There is nonocclusive thrombus within distal right M1 MCA extending to the bifurcation. Left middle cerebral artery is patent. Anterior cerebral arteries are patent. Congenitally absent right A1 ACA. Posterior circulation: Intracranial vertebral arteries, basilar artery, and posterior cerebral arteries are patent. There are bilateral patent posterior communicating arteries. Venous sinuses: As permitted by contrast timing, patent. Review of the MIP images confirms the above findings CT Brain Perfusion: CBF (<30%) Volume: 1m Perfusion (Tmax>6.0s) volume: 1576mMismatch Volume: 12639mnfarction Location: Right MCA territory IMPRESSION: No acute intracranial hemorrhage or evidence of acute infarction. ASPECT score is 10. 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 Perfusion imaging demonstrates 25 mL core infarction and 126 mL territory at risk in the right MCA territory. No hemodynamically significant stenosis in the neck.  These results were called by telephone at the time of interpretation on 06/04/2019 at 11:53 am to provider [facility] HospitalLESAlexis Goodellwho verbally acknowledged these results. Electronically Signed: By: PraMalachi Carl  Adaia Matthies M.D. On: 06/04/2019 11:59   DG Abd 1 View  Result Date: 06/05/2019 CLINICAL DATA:  New NG tube EXAM: ABDOMEN - 1 VIEW COMPARISON:  None. FINDINGS: Advancement of NG tube such that the tip is at the expected location of the junction of the second third portion the duodenum. IMPRESSION: NG tube extends into duodenum Electronically Signed   By: Suzy Bouchard M.D.   On: 06/05/2019 14:28   DG Abd 1 View  Result Date: 06/04/2019 CLINICAL DATA:  Nasogastric tube placement. EXAM: ABDOMEN - 1 VIEW COMPARISON:  None. FINDINGS: A nasogastric tube is seen with its distal tip overlying the body of the stomach. This is approximately 7.8 cm distal to the expected region of the gastroesophageal junction. The bowel gas pattern is normal. No radio-opaque calculi or other significant radiographic abnormality are seen. IMPRESSION: Nasogastric tube positioning, as described above. Further advancement of the NG tube by approximately 5 cm is recommended to decrease the risk of aspiration. Electronically Signed   By: Virgina Norfolk M.D.   On: 06/04/2019 20:23   CT HEAD WO CONTRAST  Result Date: 06/05/2019 CLINICAL DATA:  Follow-up stroke.  Right MCA territory infarctions. EXAM: CT HEAD WITHOUT CONTRAST TECHNIQUE: Contiguous axial images were obtained from the base of the skull through the vertex without intravenous contrast. COMPARISON:  Multiple CT studies over the last 2 days. FINDINGS: Brain: Patchy low-density in the right middle cerebral artery territory continues to become more evident by CT consistent with multiple infarctions in that region. These are notable in the insula, right lateral and posterior temporal lobe, right frontal operculum and right frontal parietal junction. No significant  swelling, mass effect or any detectable hemorrhage. Right MCA stent as seen previously. Vascular: There is atherosclerotic calcification of the major vessels at the base of the brain. Right MCA stent as seen previously. Skull: Negative Sinuses/Orbits: Chronic sinusitis particularly affecting the right maxillary sinus. Orbits negative. Other: None IMPRESSION: Multiple infarctions in the right middle cerebral artery territory becoming more evident on CT, with increasing low density. No evidence of significant mass effect or any hemorrhage. Electronically Signed   By: Nelson Chimes M.D.   On: 06/05/2019 15:38   CT HEAD WO CONTRAST  Result Date: 06/04/2019 CLINICAL DATA:  Stroke, follow-up. EXAM: CT HEAD WITHOUT CONTRAST TECHNIQUE: Contiguous axial images were obtained from the base of the skull through the vertex without intravenous contrast. COMPARISON:  Noncontrast head CT and CT angiogram head/neck performed earlier the same day 06/04/2019 FINDINGS: Brain: There has been interval development of patchy acute cortical/subcortical infarction changes within the right MCA vascular territory. Findings are most notable within the right frontal operculum, right insula and within portions of the right temporoparietal junction (series 3, image 20) (series 3, image 18) (series 5, image 48). No evidence of hemorrhagic conversion. No significant mass effect. No midline shift. There is no hydrocephalus. No extra-axial fluid collection is identified. Vascular: A stent is now present in the region of the M1 right middle cerebral artery. Otherwise, no hyperdense vessel is identified. Skull: Normal. Negative for fracture or focal lesion. Sinuses/Orbits: Visualized orbits demonstrate no acute abnormality. Moderate mucosal thickening and frothy secretions within the right maxillary sinus. Additional mild scattered paranasal sinus mucosal thickening. Left frontal sinus mucous retention cyst. No significant mastoid effusion.  IMPRESSION: Interval development of patchy acute cortical/subcortical infarction changes within the right MCA vascular territory most notable within the right frontal operculum, right insula and portions of the right temporoparietal junction. No hemorrhagic conversion. No  midline shift or significant mass effect. Paranasal sinus disease as described, most notably right maxillary. Electronically Signed   By: Kellie Simmering DO   On: 06/04/2019 18:43   CT ANGIO NECK W OR WO CONTRAST  Addendum Date: 06/04/2019   ADDENDUM REPORT: 06/04/2019 12:21 ADDENDUM: Contrast dose is 100 mL Omnipaque 350 Electronically Signed   By: Macy Mis M.D.   On: 06/04/2019 12:21   Result Date: 06/04/2019 CLINICAL DATA:  Code stroke.  Slurred speech and left facial droop EXAM: CT ANGIOGRAPHY HEAD AND NECK CT PERFUSION BRAIN TECHNIQUE: Multidetector CT imaging of the head and neck was performed using the standard protocol during bolus administration of intravenous contrast. Multiplanar CT image reconstructions and MIPs were obtained to evaluate the vascular anatomy. Carotid stenosis measurements (when applicable) are obtained utilizing NASCET criteria, using the distal internal carotid diameter as the denominator. Multiphase CT imaging of the brain was performed following IV bolus contrast injection. Subsequent parametric perfusion maps were calculated using RAPID software. COMPARISON:  None. FINDINGS: CT HEAD Brain: There is no acute intracranial hemorrhage mass effect edema. Gray-white differentiation is preserved. Ventricles and sulci normal in size and configuration. There is no extra-axial fluid collection. Vascular: Hyperdense vessel. Skull: Unremarkable. Sinuses/Orbits: Lobular mucosal thickening with greatest involvement of the right maxillary sinus. No acute orbital finding. Other: Mastoid air cells are clear ASPECTS [facility] Stroke Program Early CT Score) - Ganglionic level infarction (caudate, lentiform nuclei, internal  capsule, insula, M1-M3 cortex): 7 - Supraganglionic infarction (M4-M6 cortex): 3 Total score (0-10 with 10 being normal): 10 Review of the MIP images confirms the above findings CTA NECK Aortic arch: Great vessel origins are patent. Right carotid system: Patent. There is trace calcified plaque at the ICA origin measurable stenosis. Left carotid system: Patent. Eccentric noncalcified plaque causing less than 50% stenosis. Mild calcified plaque at the ICA origin causing less than 50% stenosis. Vertebral arteries: Patent.  Left vertebral artery dominant. Skeleton: Cervical spine degenerative changes. Other neck: No mass or adenopathy Upper chest: No apical lung mass. Review of the MIP images confirms the above findings CTA HEAD Anterior circulation: Intracranial internal carotid arteries patent. There is nonocclusive thrombus within distal right M1 MCA extending to the bifurcation. Left middle cerebral artery is patent. Anterior cerebral arteries are patent. Congenitally absent right A1 ACA. Posterior circulation: Intracranial vertebral arteries, basilar artery, and posterior cerebral arteries are patent. There are bilateral patent posterior communicating arteries. Venous sinuses: As permitted by contrast timing, patent. Review of the MIP images confirms the above findings CT Brain Perfusion: CBF (<30%) Volume: 36m Perfusion (Tmax>6.0s) volume: 1543mMismatch Volume: 12669mnfarction Location: Right MCA territory IMPRESSION: No acute intracranial hemorrhage or evidence of acute infarction. ASPECT score is 10. 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 Perfusion imaging demonstrates 25 mL core infarction and 126 mL territory at risk in the right MCA territory. No hemodynamically significant stenosis in the neck. These results were called by telephone at the time of interpretation on 06/04/2019 at 11:53 am to provider JONF[facility] HospitalLESAlexis Goodellwho verbally acknowledged  these results. Electronically Signed: By: PraMacy MisD. On: 06/04/2019 11:59   IR Intra Cran Stent  Result Date: 06/04/2019 CLINICAL DATA:  [age]ar old male with past medical history significant for colon cancer in 2012. He developed left-sided weakness and neglect on awakening this morning. [facility] showed small hypodensity in the right insula with no hemorrhage. NIHSS 10. CT  angiogram showed tapering of the right M1/MCA with short-segment filling defect. CT perfusion showed an estimated core volume of 25 mL with a mismatch of 126 mL. He was transferred to our service for emergency treatment of the right M1/MCA occlusion. EXAM: IR CT HEAD LIMITED; IR PERCUTANEOUS ART THORMBECTOMY/INFUSION INTRACRANIAL INCLUDE DIAG ANGIO; IR ULTRASOUND GUIDANCE VASC ACCESS RIGHT; INTRACRANIAL STENT (INCL PTA) ANESTHESIA/SEDATION: General anesthesia. MEDICATIONS: Intra arterial Verapamil 5 mg; intra arterial Integrilin 19 mg. CONTRAST:  10m OMNIPAQUE IOHEXOL 300 MG/ML SOLN, 766mOMNIPAQUE IOHEXOL 240 MG/ML SOLN PROCEDURE: Operator: Dr. KaPedro EarlsMD. No healthcare proxy or next of kin available for informed consent. Discussion held with the neurohospitalist attending. Agreement reached that the procedure would be in the best interest of the patient and benefits outweighed risks. The patient was made to lie supine on the angiography table. He was prepped and draped utilizing the usual sterile technique. Using gray scale ultrasound-guided, a micropuncture kit and modified Seldinger technique, access was gained to the right common femoral artery. An 8 French femoral sheath was then placed. Under fluoroscopy, an 8 Fr[place]alrus balloon guide catheter was navigated over a 6 FrPakistanerenstein 2 catheter and a 0.038 inch Terumo Glidewire into the aortic arch. Under fluoroscopy, the catheter was advanced into the right common carotid artery and then into the right  internal carotid artery. Frontal and lateral angiograms of the head were obtained showing a distal right M1/MCA occlusion with minimal delayed penetration of contrast. A large bore aspiration catheter was then navigated through the walrus balloon guide catheter and over a phenom 21 microcatheter and a synchro support microguidewire into the cavernous segment of the right ICA. The microcatheter was then advanced over the microwire into a right M1/MCA middle division branch. A 6 mm solitaire stent retriever was deployed spanning the right M1/MCA and proximal M2/middle division branch. Device was allowed to intercalated with the clot for 4 minutes. The microcatheter was removed under fluoroscopy. The guiding catheter balloon was inflated at the level of the proximal petrous segment. Thrombectomy device and aspiration catheter were then removed under constant aspiration. Follow-up angiogram showed recanalization of the M1 segment with subocclusive filling defect and slow flow in distal MCA branches (TICI 2C). A large bore aspiration catheter was again navigated through the walrus balloon guide catheter and over a phenom 21 microcatheter and a synchro support microguidewire into the cavernous segment of the right ICA. The microcatheter was then advanced over the microwire into a right M1/MCA posterior division branch. A 6 mm solitaire stent retriever was deployed spanning the right M1/MCA and proximal M2 posterior division branch. Device was allowed to intercalated with the clot for 4 minutes. The microcatheter was removed under fluoroscopy. The guiding catheter balloon was inflated at the level of the proximal petrous segment. Thrombectomy device and aspiration catheter were then removed under constant aspiration. Follow-up angiogram show normal forward flow into the right MCA vascular territory noting a small dissection flap in the mid/distal right M1/MCA segment. Delayed follow-up angiogram showed progressive  reocclusion of the right M1/MCA with minimal forward flow. Flat panel CT of the head was obtained and post processed in a separate workstation under concurrent attending physician supervision. No evidence of hemorrhagic complication noted. Right ICA angiograms with frontal lateral views of the head were obtained. Further progression of occlusion in the right M1 segment was noted. Small non flow limiting iatrogenic dissection in the upper cervical segment of the right ICA identified. The large bore aspiration catheter was navigated  through the walrus balloon guide catheter and over a SL 10 microcatheter and a synchro support microguidewire into the cavernous segment of the right ICA. The microcatheter was then advanced over the microwire into a right M1/MCA posterior division branch. Intra arterial infusion of 19 mg of Integrilin was performed into the right ICA. Subsequently, a 3 mm x 15 mm atlas intracranial stent was deployed in the M1 segment across the dissection flap. Follow-up angiogram showed adequate stent positioning with complete restoration of the anterograde flow in the right MCA. Delayed follow-up angiogram (x2) show no evidence of reocclusion. 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 Postprocedural flat panel CT of the head was obtained and post processed in a separate workstation under concurrent attending physician supervision. No evidence of hemorrhagic complication noted. The catheter system was withdrawn. A right common femoral artery angiogram was obtained showing access at the level of the right common femoral artery. The femoral sheath was removed and the access was closed with a Perclose ProGlide. Immediate hemostasis achieved. 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 FINDINGS: Dissection of the mid/distal right M1/MCA. IMPRESSION: 1. Mechanical thrombectomy performed (x2) with  temporary restoration of flow revealing dissection flap in the mid/distal right M1/MCA segment. 2. Intracranial stenting placed in the right M1/MCA across the dissection flap with complete restoration of flow. 3. No hemorrhagic complication on postprocedural flat panel CT. No embolus to new territory. PLAN: Patient is to remain on Integrilin drip. A follow-up head CT will be obtained at 6:30 p.m. today. At this point, patient will be re-evaluated for transition to oral dual anti-platelet therapy. Electronically Signed   By: Pedro Earls M.D.   On: 06/04/2019 17:40   IR CT Head Ltd  Result Date: 06/04/2019 CLINICAL DATA:  59 year old male with past medical history significant for colon cancer in 2012. He developed left-sided weakness and neglect on awakening this morning. He was taken to [facility]. Head CT showed small hypodensity in the right insula with no hemorrhage. NIHSS 10. CT angiogram showed tapering of the right M1/MCA with short-segment filling defect. CT perfusion showed an estimated core volume of 25 mL with a mismatch of 126 mL. He was transferred to our service for emergency treatment of the right M1/MCA occlusion. EXAM: IR CT HEAD LIMITED; IR PERCUTANEOUS ART THORMBECTOMY/INFUSION INTRACRANIAL INCLUDE DIAG ANGIO; IR ULTRASOUND GUIDANCE VASC ACCESS RIGHT; INTRACRANIAL STENT (INCL PTA) ANESTHESIA/SEDATION: General anesthesia. MEDICATIONS: Intra arterial Verapamil 5 mg; intra arterial Integrilin 19 mg. CONTRAST:  57m OMNIPAQUE IOHEXOL 300 MG/ML SOLN, 761mOMNIPAQUE IOHEXOL 240 MG/ML SOLN PROCEDURE: Operator: Dr. KaPedro EarlsMD. No healthcare proxy or next of kin available for informed consent. Discussion held with the neurohospitalist attending. Agreement reached that the procedure would be in the best interest of the patient and benefits outweighed risks. The patient was made to lie supine on the angiography table. He was prepped and draped  utilizing the usual sterile technique. Using gray scale ultrasound-guided, a micropuncture kit and modified Seldinger technique, access was gained to the right common femoral artery. An 8 French femoral sheath was then placed. Under fluoroscopy, an 8 Fr[place]alrus balloon guide catheter was navigated over a 6 FrPakistanerenstein 2 catheter and a 0.038 inch Terumo Glidewire into the aortic arch. Under fluoroscopy, the catheter was advanced into the right common carotid artery and then into the right internal carotid artery. Frontal and lateral angiograms  of the head were obtained showing a distal right M1/MCA occlusion with minimal delayed penetration of contrast. A large bore aspiration catheter was then navigated through the walrus balloon guide catheter and over a phenom 21 microcatheter and a synchro support microguidewire into the cavernous segment of the right ICA. The microcatheter was then advanced over the microwire into a right M1/MCA middle division branch. A 6 mm solitaire stent retriever was deployed spanning the right M1/MCA and proximal M2/middle division branch. Device was allowed to intercalated with the clot for 4 minutes. The microcatheter was removed under fluoroscopy. The guiding catheter balloon was inflated at the level of the proximal petrous segment. Thrombectomy device and aspiration catheter were then removed under constant aspiration. Follow-up angiogram showed recanalization of the M1 segment with subocclusive filling defect and slow flow in distal MCA branches (TICI 2C). A large bore aspiration catheter was again navigated through the walrus balloon guide catheter and over a phenom 21 microcatheter and a synchro support microguidewire into the cavernous segment of the right ICA. The microcatheter was then advanced over the microwire into a right M1/MCA posterior division branch. A 6 mm solitaire stent retriever was deployed spanning the right M1/MCA and proximal M2 posterior division  branch. Device was allowed to intercalated with the clot for 4 minutes. The microcatheter was removed under fluoroscopy. The guiding catheter balloon was inflated at the level of the proximal petrous segment. Thrombectomy device and aspiration catheter were then removed under constant aspiration. Follow-up angiogram show normal forward flow into the right MCA vascular territory noting a small dissection flap in the mid/distal right M1/MCA segment. Delayed follow-up angiogram showed progressive reocclusion of the right M1/MCA with minimal forward flow. Flat panel CT of the head was obtained and post processed in a separate workstation under concurrent attending physician supervision. No evidence of hemorrhagic complication noted. Right ICA angiograms with frontal lateral views of the head were obtained. Further progression of occlusion in the right M1 segment was noted. Small non flow limiting iatrogenic dissection in the upper cervical segment of the right ICA identified. The large bore aspiration catheter was navigated through the walrus balloon guide catheter and over a SL 10 microcatheter and a synchro support microguidewire into the cavernous segment of the right ICA. The microcatheter was then advanced over the microwire into a right M1/MCA posterior division branch. Intra arterial infusion of 19 mg of Integrilin was performed into the right ICA. Subsequently, a 3 mm x 15 mm atlas intracranial stent was deployed in the M1 segment across the dissection flap. Follow-up angiogram showed adequate stent positioning with complete restoration of the anterograde flow in the right MCA. Delayed follow-up angiogram (x2) show no evidence of reocclusion. 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 Postprocedural flat panel CT of the head was obtained and post processed in a separate workstation under concurrent attending physician supervision. No evidence of hemorrhagic  complication noted. The catheter system was withdrawn. A right common femoral artery angiogram was obtained showing access at the level of the right common femoral artery. The femoral sheath was removed and the access was closed with a Perclose ProGlide. Immediate hemostasis achieved. 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 FINDINGS: Dissection of the mid/distal right M1/MCA. IMPRESSION: 1. Mechanical thrombectomy performed (x2) with temporary restoration of flow revealing dissection flap in the mid/distal right M1/MCA segment. 2. Intracranial stenting placed in the right M1/MCA across the dissection  flap with complete restoration of flow. 3. No hemorrhagic complication on postprocedural flat panel CT. No embolus to new territory. PLAN: Patient is to remain on Integrilin drip. A follow-up head CT will be obtained at 6:30 p.m. today. At this point, patient will be re-evaluated for transition to oral dual anti-platelet therapy. Electronically Signed   By: Pedro Earls M.D.   On: 06/04/2019 17:40   IR CT Head Ltd  Result Date: 06/04/2019 CLINICAL DATA:  59 year old male with past medical history significant for colon cancer in 2012. He developed left-sided weakness and neglect on awakening this morning. He was taken to [facility] hospital. Head CT showed small hypodensity in the right insula with no hemorrhage. NIHSS 10. CT angiogram showed tapering of the right M1/MCA with short-segment filling defect. CT perfusion showed an estimated core volume of 25 mL with a mismatch of 126 mL. He was transferred to our service for emergency treatment of the right M1/MCA occlusion. EXAM: IR CT HEAD LIMITED; IR PERCUTANEOUS ART THORMBECTOMY/INFUSION INTRACRANIAL INCLUDE DIAG ANGIO; IR ULTRASOUND GUIDANCE VASC ACCESS RIGHT; INTRACRANIAL STENT (INCL PTA) ANESTHESIA/SEDATION: General anesthesia. MEDICATIONS: Intra arterial Verapamil 5 mg; intra  arterial Integrilin 19 mg. CONTRAST:  31m OMNIPAQUE IOHEXOL 300 MG/ML SOLN, 747mOMNIPAQUE IOHEXOL 240 MG/ML SOLN PROCEDURE: Operator: Dr. KaPedro EarlsMD. No healthcare proxy or next of kin available for informed consent. Discussion held with the neurohospitalist attending. Agreement reached that the procedure would be in the best interest of the patient and benefits outweighed risks. The patient was made to lie supine on the angiography table. He was prepped and draped utilizing the usual sterile technique. Using gray scale ultrasound-guided, a micropuncture kit and modified Seldinger technique, access was gained to the right common femoral artery. An 8 French femoral sheath was then placed. Under fluoroscopy, an 8 [place] balloon guide catheter was navigated over a 6 FrPakistanerenstein 2 catheter and a 0.038 inch Terumo Glidewire into the aortic arch. Under fluoroscopy, the catheter was advanced into the right common carotid artery and then into the right internal carotid artery. Frontal and lateral angiograms of the head were obtained showing a distal right M1/MCA occlusion with minimal delayed penetration of contrast. A large bore aspiration catheter was then navigated through the walrus balloon guide catheter and over a phenom 21 microcatheter and a synchro support microguidewire into the cavernous segment of the right ICA. The microcatheter was then advanced over the microwire into a right M1/MCA middle division branch. A 6 mm solitaire stent retriever was deployed spanning the right M1/MCA and proximal M2/middle division branch. Device was allowed to intercalated with the clot for 4 minutes. The microcatheter was removed under fluoroscopy. The guiding catheter balloon was inflated at the level of the proximal petrous segment. Thrombectomy device and aspiration catheter were then removed under constant aspiration. Follow-up angiogram showed recanalization of the M1 segment with  subocclusive filling defect and slow flow in distal MCA branches (TICI 2C). A large bore aspiration catheter was again navigated through the walrus balloon guide catheter and over a phenom 21 microcatheter and a synchro support microguidewire into the cavernous segment of the right ICA. The microcatheter was then advanced over the microwire into a right M1/MCA posterior division branch. A 6 mm solitaire stent retriever was deployed spanning the right M1/MCA and proximal M2 posterior division branch. Device was allowed to intercalated with the clot for 4 minutes. The microcatheter was removed under fluoroscopy. The guiding catheter balloon was inflated at the  level of the proximal petrous segment. Thrombectomy device and aspiration catheter were then removed under constant aspiration. Follow-up angiogram show normal forward flow into the right MCA vascular territory noting a small dissection flap in the mid/distal right M1/MCA segment. Delayed follow-up angiogram showed progressive reocclusion of the right M1/MCA with minimal forward flow. Flat panel CT of the head was obtained and post processed in a separate workstation under concurrent attending physician supervision. No evidence of hemorrhagic complication noted. Right ICA angiograms with frontal lateral views of the head were obtained. Further progression of occlusion in the right M1 segment was noted. Small non flow limiting iatrogenic dissection in the upper cervical segment of the right ICA identified. The large bore aspiration catheter was navigated through the walrus balloon guide catheter and over a SL 10 microcatheter and a synchro support microguidewire into the cavernous segment of the right ICA. The microcatheter was then advanced over the microwire into a right M1/MCA posterior division branch. Intra arterial infusion of 19 mg of Integrilin was performed into the right ICA. Subsequently, a 3 mm x 15 mm atlas intracranial stent was deployed in the M1  segment across the dissection flap. Follow-up angiogram showed adequate stent positioning with complete restoration of the anterograde flow in the right MCA. Delayed follow-up angiogram (x2) show no evidence of reocclusion. 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 Postprocedural flat panel CT of the head was obtained and post processed in a separate workstation under concurrent attending physician supervision. No evidence of hemorrhagic complication noted. The catheter system was withdrawn. A right common femoral artery angiogram was obtained showing access at the level of the right common femoral artery. The femoral sheath was removed and the access was closed with a Perclose ProGlide. Immediate hemostasis achieved. 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 FINDINGS: Dissection of the mid/distal right M1/MCA. IMPRESSION: 1. Mechanical thrombectomy performed (x2) with temporary restoration of flow revealing dissection flap in the mid/distal right M1/MCA segment. 2. Intracranial stenting placed in the right M1/MCA across the dissection flap with complete restoration of flow. 3. No hemorrhagic complication on postprocedural flat panel CT. No embolus to new territory. PLAN: Patient is to remain on Integrilin drip. A follow-up head CT will be obtained at 6:30 p.m. today. At this point, patient will be re-evaluated for transition to oral dual anti-platelet therapy. Electronically Signed   By: Pedro Earls M.D.   On: 06/04/2019 17:40   IR US Guide Vasc Access Right  Result Date: 06/04/2019 CLINICAL DATA:  59 year old male with past medical history significant for colon cancer in 2012. He developed left-sided weakness and neglect on awakening this morning. He was taken to [facility] hospital. Head CT showed small hypodensity in the right insula with no hemorrhage. NIHSS 10. CT  angiogram showed tapering of the right M1/MCA with short-segment filling defect. CT perfusion showed an estimated core volume of 25 mL with a mismatch of 126 mL. He was transferred to our service for emergency treatment of the right M1/MCA occlusion. EXAM: IR CT HEAD LIMITED; IR PERCUTANEOUS ART THORMBECTOMY/INFUSION INTRACRANIAL INCLUDE DIAG ANGIO; IR ULTRASOUND GUIDANCE VASC ACCESS RIGHT; INTRACRANIAL STENT (INCL PTA) ANESTHESIA/SEDATION: General anesthesia. MEDICATIONS: Intra arterial Verapamil 5 mg; intra arterial Integrilin 19 mg. CONTRAST:  12m OMNIPAQUE IOHEXOL 300 MG/ML SOLN, 768mOMNIPAQUE IOHEXOL 240 MG/ML SOLN PROCEDURE: Operator: Dr. KaPedro EarlsMD. No healthcare proxy or next of kin available  for informed consent. Discussion held with the neurohospitalist attending. Agreement reached that the procedure would be in the best interest of the patient and benefits outweighed risks. The patient was made to lie supine on the angiography table. He was prepped and draped utilizing the usual sterile technique. Using gray scale ultrasound-guided, a micropuncture kit and modified Seldinger technique, access was gained to the right common femoral artery. An 8 French femoral sheath was then placed. Under fluoroscopy, an 8 [place] Walrus balloon guide catheter was navigated over a 6 [place] Berenstein 2 catheter and a 0.038 inch Terumo Glidewire into the aortic arch. Under fluoroscopy, the catheter was advanced into the right common carotid artery and then into the right internal carotid artery. Frontal and lateral angiograms of the head were obtained showing a distal right M1/MCA occlusion with minimal delayed penetration of contrast. A large bore aspiration catheter was then navigated through the walrus balloon guide catheter and over a phenom 21 microcatheter and a synchro support microguidewire into the cavernous segment of the right ICA. The microcatheter was then advanced over the microwire into  a right M1/MCA middle division branch. A 6 mm solitaire stent retriever was deployed spanning the right M1/MCA and proximal M2/middle division branch. Device was allowed to intercalated with the clot for 4 minutes. The microcatheter was removed under fluoroscopy. The guiding catheter balloon was inflated at the level of the proximal petrous segment. Thrombectomy device and aspiration catheter were then removed under constant aspiration. Follow-up angiogram showed recanalization of the M1 segment with subocclusive filling defect and slow flow in distal MCA branches (TICI 2C). A large bore aspiration catheter was again navigated through the walrus balloon guide catheter and over a phenom 21 microcatheter and a synchro support microguidewire into the cavernous segment of the right ICA. The microcatheter was then advanced over the microwire into a right M1/MCA posterior division branch. A 6 mm solitaire stent retriever was deployed spanning the right M1/MCA and proximal M2 posterior division branch. Device was allowed to intercalated with the clot for 4 minutes. The microcatheter was removed under fluoroscopy. The guiding catheter balloon was inflated at the level of the proximal petrous segment. Thrombectomy device and aspiration catheter were then removed under constant aspiration. Follow-up angiogram show normal forward flow into the right MCA vascular territory noting a small dissection flap in the mid/distal right M1/MCA segment. Delayed follow-up angiogram showed progressive reocclusion of the right M1/MCA with minimal forward flow. Flat panel CT of the head was obtained and post processed in a separate workstation under concurrent attending physician supervision. No evidence of hemorrhagic complication noted. Right ICA angiograms with frontal lateral views of the head were obtained. Further progression of occlusion in the right M1 segment was noted. Small non flow limiting iatrogenic dissection in the upper  cervical segment of the right ICA identified. The large bore aspiration catheter was navigated through the walrus balloon guide catheter and over a SL 10 microcatheter and a synchro support microguidewire into the cavernous segment of the right ICA. The microcatheter was then advanced over the microwire into a right M1/MCA posterior division branch. Intra arterial infusion of 19 mg of Integrilin was performed into the right ICA. Subsequently, a 3 mm x 15 mm atlas intracranial stent was deployed in the M1 segment across the dissection flap. Follow-up angiogram showed adequate stent positioning with complete restoration of the anterograde flow in the right MCA. Delayed follow-up angiogram (x2) show no evidence of reocclusion. Small non flow-limiting dissection of the upper cervical  segment of the right ICA remained stable. Postprocedural flat panel CT of the head was obtained and post processed in a separate workstation under concurrent attending physician supervision. No evidence of hemorrhagic complication noted. The catheter system was withdrawn. A right common femoral artery angiogram was obtained showing access at the level of the right common femoral artery. The femoral sheath was removed and the access was closed with a Perclose ProGlide. Immediate hemostasis achieved. 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 FINDINGS: Dissection of the mid/distal right M1/MCA. IMPRESSION: 1. Mechanical thrombectomy performed (x2) with temporary restoration of flow revealing dissection flap in the mid/distal right M1/MCA segment. 2. Intracranial stenting placed in the right M1/MCA across the dissection flap with complete restoration of flow. 3. No hemorrhagic complication on postprocedural flat panel CT. No embolus to new territory. PLAN: Patient is to remain on Integrilin drip. A follow-up head CT will be obtained at 6:30 p.m. today. At this point, patient will be  re-evaluated for transition to oral dual anti-platelet therapy. Electronically Signed   By: Pedro Earls M.D.   On: 06/04/2019 17:40   CT CEREBRAL PERFUSION W CONTRAST  Addendum Date: 06/04/2019   ADDENDUM REPORT: 06/04/2019 12:21 ADDENDUM: Contrast dose is 100 mL Omnipaque 350 Electronically Signed   By: Macy Mis M.D.   On: 06/04/2019 12:21   Result Date: 06/04/2019 CLINICAL DATA:  Code stroke.  Slurred speech and left facial droop EXAM: CT ANGIOGRAPHY HEAD AND NECK CT PERFUSION BRAIN TECHNIQUE: Multidetector CT imaging of the head and neck was performed using the standard protocol during bolus administration of intravenous contrast. Multiplanar CT image reconstructions and MIPs were obtained to evaluate the vascular anatomy. Carotid stenosis measurements (when applicable) are obtained utilizing NASCET criteria, using the distal internal carotid diameter as the denominator. Multiphase CT imaging of the brain was performed following IV bolus contrast injection. Subsequent parametric perfusion maps were calculated using RAPID software. COMPARISON:  None. FINDINGS: CT HEAD Brain: There is no acute intracranial hemorrhage mass effect edema. Gray-white differentiation is preserved. Ventricles and sulci normal in size and configuration. There is no extra-axial fluid collection. Vascular: Hyperdense vessel. Skull: Unremarkable. Sinuses/Orbits: Lobular mucosal thickening with greatest involvement of the right maxillary sinus. No acute orbital finding. Other: Mastoid air cells are clear ASPECTS [facility] Stroke Program Early CT Score) - Ganglionic level infarction (caudate, lentiform nuclei, internal capsule, insula, M1-M3 cortex): 7 - Supraganglionic infarction (M4-M6 cortex): 3 Total score (0-10 with 10 being normal): 10 Review of the MIP images confirms the above findings CTA NECK Aortic arch: Great vessel origins are patent. Right carotid system: Patent. There is trace calcified plaque at the  ICA origin measurable stenosis. Left carotid system: Patent. Eccentric noncalcified plaque causing less than 50% stenosis. Mild calcified plaque at the ICA origin causing less than 50% stenosis. Vertebral arteries: Patent.  Left vertebral artery dominant. Skeleton: Cervical spine degenerative changes. Other neck: No mass or adenopathy Upper chest: No apical lung mass. Review of the MIP images confirms the above findings CTA HEAD Anterior circulation: Intracranial internal carotid arteries patent. There is nonocclusive thrombus within distal right M1 MCA extending to the bifurcation. Left middle cerebral artery is patent. Anterior cerebral arteries are patent. Congenitally absent right A1 ACA. Posterior circulation: Intracranial vertebral arteries, basilar artery, and posterior cerebral arteries are patent. There are bilateral patent posterior communicating arteries. Venous sinuses: As permitted by contrast timing, patent. Review of the MIP images confirms the above findings  CT Brain Perfusion: CBF (<30%) Volume: 67m Perfusion (Tmax>6.0s) volume: 1584mMismatch Volume: 12693mnfarction Location: Right MCA territory IMPRESSION: No acute intracranial hemorrhage or evidence of acute infarction. ASPECT score is 10. 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 Perfusion imaging demonstrates 25 mL core infarction and 126 mL territory at risk in the right MCA territory. No hemodynamically significant stenosis in the neck. These results were called by telephone at the time of interpretation on 06/04/2019 at 11:53 am to provider JONSt Vincent Carmel Hospital IncLESAlexis Goodellwho verbally acknowledged these results. Electronically Signed: By: PraMacy MisD. On: 06/04/2019 11:59   DG Chest Port 1 View  Result Date: 06/09/2019 CLINICAL DATA:  Encounter for leukocytosis. Pt admitted for a stroke "several weeks ago." Pt denies hx of heart or lung problems. EXAM: PORTABLE CHEST - 1 VIEW  COMPARISON:  06/05/2019 FINDINGS: New ill-defined interstitial opacities in both lung bases. No effusion. No pneumothorax. Heart size normal. Nasogastric tube has been removed. Regional bones unremarkable IMPRESSION: New bibasilar interstitial infiltrates, edema, or subsegmental atelectasis Electronically Signed   By: D  Lucrezia EuropeD.   On: 06/09/2019 11:43   DG CHEST PORT 1 VIEW  Result Date: 06/05/2019 CLINICAL DATA:  NG tube placement. EXAM: PORTABLE CHEST 1 VIEW COMPARISON:  CT angiogram of the chest dated 09/13/2002 FINDINGS: NG tube tip is below the diaphragm. Heart size and pulmonary vascularity are normal. Lungs are clear. No significant bone abnormality. IMPRESSION: Normal chest.  NG tube tip below the diaphragm. Electronically Signed   By: JamLorriane ShireD.   On: 06/05/2019 14:29   DG Abd Portable 1V  Result Date: 06/05/2019 CLINICAL DATA:  Nasogastric tube placement. EXAM: PORTABLE ABDOMEN - 1 VIEW COMPARISON:  Same day. FINDINGS: The bowel gas pattern is normal. Distal tip of nasogastric tube is seen in expected position of the gastroesophageal junction. No radio-opaque calculi or other significant radiographic abnormality are seen. IMPRESSION: Distal tip of nasogastric tube seen in expected position of gastroesophageal junction. Proximal side hole is seen in distal esophagus. Advancement is recommended. Electronically Signed   By: JamMarijo ConceptionD.   On: 06/05/2019 12:01   DG Abd Portable 1V  Result Date: 06/05/2019 CLINICAL DATA:  Nasogastric tube placement EXAM: PORTABLE ABDOMEN - 1 VIEW COMPARISON:  Portable exam 1050 hours compared to 0858 hours FINDINGS: Tip of nasogastric tube projects over stomach though the proximal side-port projects over the distal esophagus; recommend advancing tube 5 cm. Nonobstructive bowel gas pattern. No bowel dilatation or bowel wall thickening. Osseous structures unremarkable. IMPRESSION: Proximal side-port of nasogastric tube projects over distal  esophagus; recommend advancing tube 5 cm. Electronically Signed   By: MarLavonia DanaD.   On: 06/05/2019 11:00   DG Abd Portable 1V  Result Date: 06/05/2019 CLINICAL DATA:  NG tube placement EXAM: PORTABLE ABDOMEN - 1 VIEW COMPARISON:  06/04/2019 FINDINGS: Esophagogastric tube remains with tip below the diaphragm and side port above the gastroesophageal junction. General paucity of bowel although scattered gas is gas present to the rectum. No free air on supine radiographs. IMPRESSION: Esophagogastric tube remains with tip below the diaphragm and side port above the gastroesophageal junction. Recommend advancement to ensure subdiaphragmatic positioning of tip and side port. Electronically Signed   By: AleEddie CandleD.   On: 06/05/2019 09:25   DG Swallowing Func-Speech Pathology  Result Date: 06/06/2019 Objective Swallowing Evaluation: Type of Study: MBS-Modified Barium Swallow Study  Patient Details Name: JamDEIONDRE HARROWERN: [id number]  Date of Birth: 1960-03-25 Today's Date: 06/06/2019 Time: SLP Start Time (ACUTE ONLY): 0017 -SLP Stop Time (ACUTE ONLY): 1416 SLP Time Calculation (min) (ACUTE ONLY): 19 min Past Medical History: Past Medical History: Diagnosis Date  Cancer ([place]) 2012  colon cancer  Closed fracture of left distal radius   GERD (gastroesophageal reflux disease)  Past Surgical History: Past Surgical History: Procedure Laterality Date  COLON SURGERY  2012  colon cancer  IR CT HEAD LTD  06/04/2019  IR CT HEAD LTD  06/04/2019  IR INTRA CRAN STENT  06/04/2019  IR PERCUTANEOUS ART THROMBECTOMY/INFUSION INTRACRANIAL INC DIAG ANGIO  06/04/2019  IR US GUIDE VASC ACCESS RIGHT  06/04/2019  OPEN REDUCTION INTERNAL FIXATION (ORIF) DISTAL RADIAL FRACTURE Left 09/20/2017  Procedure: OPEN REDUCTION INTERNAL FIXATION (ORIF) DISTAL RADIAL FRACTURE;  Surgeon: Leanora Cover, MD;  Location: [facility];  Service: Orthopedics;  Laterality: Left;  RADIOLOGY WITH ANESTHESIA N/A 06/04/2019  Procedure: IR WITH  ANESTHESIA;  Surgeon: Radiologist, Medication, MD;  Location: [facility];  Service: Radiology;  Laterality: N/A; HPI: Scott Day is a 59 y.o. male with a history of colon cancer in 2012 and GERD, who presents with left-sided weakness. 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  Subjective: cooperative Assessment / Plan / Recommendation CHL IP CLINICAL IMPRESSIONS 06/06/2019 Clinical Impression Patient presents with mild oropharyngeal dysphagia. Oral phase is remarkable for prolonged mastication and lingual residue. Pharyngeal phase is remarkable for reduced epiglottic inversion that leads to reduced laryngeal closure, resulting in penetration (PAS 4 and 5) and vallecular residue. Pt noted with throat clearing during the study following penetration, and was able to clear penetrate the majority of the time. Prior to administering POs, pt was also noted with grunting and throat clearing. Given the prolonged transit with regular solids d/t missing dentention, recommend Dys 3 and thin liquids, and meds whole in puree. Ensure the pt is following general aspiration precautions (small bites/sips, upright while eating/drinking).  SLP Visit Diagnosis Dysphagia, oropharyngeal phase (R13.12) Attention and concentration deficit following -- Frontal lobe and executive function deficit following -- Impact on safety and function Mild aspiration risk   CHL IP TREATMENT RECOMMENDATION 06/06/2019 Treatment Recommendations Therapy as outlined in treatment plan below   Prognosis 06/06/2019 Prognosis for Safe Diet Advancement Good Barriers to Reach Goals Cognitive deficits Barriers/Prognosis Comment -- CHL IP DIET RECOMMENDATION 06/06/2019 SLP Diet Recommendations Dysphagia 3 (Mech soft) solids;Thin liquid Liquid Administration via Straw;Cup;Spoon Medication Administration Whole meds with puree Compensations Minimize environmental distractions;Slow rate;Small sips/bites Postural Changes Seated  upright at 90 degrees   CHL IP OTHER RECOMMENDATIONS 06/06/2019 Recommended Consults -- Oral Care Recommendations Oral care BID Other Recommendations Have oral suction available   CHL IP FOLLOW UP RECOMMENDATIONS 06/06/2019 Follow up Recommendations Inpatient Rehab   CHL IP FREQUENCY AND DURATION 06/06/2019 Speech Therapy Frequency (ACUTE ONLY) min 2x/week Treatment Duration 2 weeks      CHL IP ORAL PHASE 06/06/2019 Oral Phase Impaired Oral - Pudding Teaspoon -- Oral - Pudding Cup -- Oral - Honey Teaspoon -- Oral - Honey Cup -- Oral - Nectar Teaspoon -- Oral - Nectar Cup -- Oral - Nectar Straw -- Oral - Thin Teaspoon WFL Oral - Thin Cup Lingual/palatal residue Oral - Thin Straw Lingual/palatal residue Oral - Puree Lingual/palatal residue Oral - Mech Soft -- Oral - Regular Impaired mastication Oral - Multi-Consistency -- Oral - Pill WFL Oral Phase - Comment --  CHL IP PHARYNGEAL PHASE 06/06/2019 Pharyngeal Phase Impaired Pharyngeal- Pudding  Teaspoon -- Pharyngeal -- Pharyngeal- Pudding Cup -- Pharyngeal -- Pharyngeal- Honey Teaspoon -- Pharyngeal -- Pharyngeal- Honey Cup -- Pharyngeal -- Pharyngeal- Nectar Teaspoon -- Pharyngeal -- Pharyngeal- Nectar Cup -- Pharyngeal -- Pharyngeal- Nectar Straw -- Pharyngeal -- Pharyngeal- Thin Teaspoon WFL Pharyngeal -- Pharyngeal- Thin Cup Penetration/Aspiration during swallow;Reduced airway/laryngeal closure Pharyngeal Material enters airway, CONTACTS cords and then ejected out Pharyngeal- Thin Straw Reduced airway/laryngeal closure;Penetration/Aspiration during swallow Pharyngeal Material enters airway, CONTACTS cords and then ejected out;Material enters airway, CONTACTS cords and not ejected out Pharyngeal- Puree Pharyngeal residue - valleculae Pharyngeal -- Pharyngeal- Mechanical Soft -- Pharyngeal -- Pharyngeal- Regular Pharyngeal residue - valleculae Pharyngeal -- Pharyngeal- Multi-consistency -- Pharyngeal -- Pharyngeal- Pill WFL Pharyngeal -- Pharyngeal Comment --  CHL IP  CERVICAL ESOPHAGEAL PHASE 06/06/2019 Cervical Esophageal Phase WFL Pudding Teaspoon -- Pudding Cup -- Honey Teaspoon -- Honey Cup -- Nectar Teaspoon -- Nectar Cup -- Nectar Straw -- Thin Teaspoon -- Thin Cup -- Thin Straw -- Puree -- Mechanical Soft -- Regular -- Multi-consistency -- Pill -- Cervical Esophageal Comment -- Osie Bond., M.A. CCC-SLP Acute Rehabilitation Services Pager [phone] Office [phone] 06/06/2019, 3:14 PM              ECHOCARDIOGRAM COMPLETE  Result Date: 06/05/2019    ECHOCARDIOGRAM REPORT   Patient Name:   TEO MOEDE Date of Exam: 06/05/2019 Medical Rec #:  [id number]      Height:       71.0 in Accession #:    [id number]     Weight:       231.5 lb Date of Birth:  1960/05/05      BSA:          2.244 m Patient Age:    64 years       BP:           126/67 mmHg Patient Gender: M              HR:           70 bpm. Exam Location:  Inpatient Procedure: 2D Echo, Cardiac Doppler and Color Doppler Indications:    Stroke 434.91/I163.9  History:        Patient has no prior history of Echocardiogram examinations.                 Risk Factors:Former Smoker. CVA.  Sonographer:    Clayton Lefort RDCS (AE) Referring Phys: [phone] MCNEILL P [place]  1. Left ventricular ejection fraction, by estimation, is 65 to 70%. The left ventricle has normal function. The left ventricle has no regional wall motion abnormalities. There is severe left ventricular hypertrophy. Left ventricular diastolic parameters  were normal.  2. Right ventricular systolic function is normal. The right ventricular size is normal.  3. The mitral valve is abnormal. Trivial mitral valve regurgitation.  4. The aortic valve is tricuspid. Aortic valve regurgitation is not visualized. No aortic stenosis is present.  5. The inferior vena cava is normal in size with <50% respiratory variability, suggesting right atrial pressure of 8 mmHg. FINDINGS  Left Ventricle: Left ventricular ejection fraction, by estimation, is 65 to 70%.  The left ventricle has normal function. The left ventricle has no regional wall motion abnormalities. The left ventricular internal cavity size was normal in size. There is  severe left ventricular hypertrophy. Left ventricular diastolic parameters were normal. Right Ventricle: The right ventricular size is normal. No increase in right ventricular wall thickness. Right ventricular systolic function is normal. Left Atrium: Left  atrial size was normal in size. Right Atrium: Right atrial size was normal in size. Pericardium: There is no evidence of pericardial effusion. Mitral Valve: The mitral valve is abnormal. There is mild thickening of the mitral valve leaflet(s). Trivial mitral valve regurgitation. MV peak gradient, 3.6 mmHg. The mean mitral valve gradient is 1.0 mmHg. Tricuspid Valve: The tricuspid valve is grossly normal. Tricuspid valve regurgitation is trivial. Aortic Valve: The aortic valve is tricuspid. Aortic valve regurgitation is not visualized. No aortic stenosis is present. Pulmonic Valve: The pulmonic valve was grossly normal. Pulmonic valve regurgitation is not visualized. Aorta: The aortic root, ascending aorta, aortic arch and descending aorta are all structurally normal, with no evidence of dilitation or obstruction. Venous: The inferior vena cava is normal in size with less than 50% respiratory variability, suggesting right atrial pressure of 8 mmHg. IAS/Shunts: The interatrial septum was not well visualized.  LEFT VENTRICLE PLAX 2D LVIDd:         4.02 cm  Diastology LVIDs:         2.40 cm  LV e' lateral:   10.40 cm/s LV PW:         1.68 cm  LV E/e' lateral: 9.0 LV IVS:        1.71 cm  LV e' medial:    9.79 cm/s LVOT diam:     2.10 cm  LV E/e' medial:  9.6 LV SV:         80 LV SV Index:   36 LVOT Area:     3.46 cm  RIGHT VENTRICLE             IVC RV Basal diam:  2.29 cm     IVC diam: 1.86 cm RV S prime:     14.10 cm/s TAPSE (M-mode): 2.7 cm LEFT ATRIUM             Index       RIGHT ATRIUM            Index LA diam:        2.70 cm 1.20 cm/m  RA Area:     11.90 cm LA Vol (A2C):   52.4 ml 23.35 ml/m RA Volume:   23.60 ml  10.52 ml/m LA Vol (A4C):   40.7 ml 18.14 ml/m LA Biplane Vol: 47.6 ml 21.21 ml/m  AORTIC VALVE AV Area (Vmean):   3.20 cm AV Area (VTI):     3.24 cm AV Vmean:          88.200 cm/s AV VTI:            0.246 m LVOT Vmax:         118.00 cm/s LVOT Vmean:        81.500 cm/s LVOT VTI:          0.230 m LVOT/AV VTI ratio: 0.93  AORTA Ao Root diam: 3.40 cm Ao Asc diam:  3.60 cm MITRAL VALVE MV Area (PHT): 3.27 cm    SHUNTS MV Peak grad:  3.6 mmHg    Systemic VTI:  0.23 m MV Mean grad:  1.0 mmHg    Systemic Diam: 2.10 cm MV Vmax:       0.94 m/s MV Vmean:      56.1 cm/s MV Decel Time: 232 msec MV E velocity: 94.10 cm/s MV A velocity: 80.00 cm/s MV E/A ratio:  1.18 Lyman Bishop MD Electronically signed by Lyman Bishop MD Signature Date/Time: 06/05/2019/11:23:20 AM    Final    ECHO TEE  Result Date: 06/08/2019    TRANSESOPHOGEAL ECHO REPORT   Patient Name:   ISHMEL ACEVEDO Date of Exam: 06/08/2019 Medical Rec #:  [id number]      Height:       71.0 in Accession #:    [id number]     Weight:       231.5 lb Date of Birth:  September 01, 1960      BSA:          2.244 m Patient Age:    48 years       BP:           125/80 mmHg Patient Gender: M              HR:           97 bpm. Exam Location:  Inpatient Procedure: Transesophageal Echo and Saline Contrast Bubble Study Indications:     Stroke  History:         Patient has prior history of Echocardiogram examinations, most                  recent 06/05/2019. Risk Factors:Former Smoker and Hypertension.  Sonographer:     Clayton Lefort RDCS (AE) Referring Phys:  [place] Diagnosing Phys: Buford Dresser MD PROCEDURE: After discussion of the risks and benefits of a TEE, an informed consent was obtained from the patient. The transesophogeal probe was passed without difficulty through the esophogus of the patient. Sedation performed by different  physician. The patient was monitored while under deep sedation. Anesthestetic sedation was provided intravenously by Anesthesiology: '345mg'$  of Propofol, '60mg'$  of Lidocaine. Image quality was good. The patient's vital signs; including heart rate, blood pressure, and oxygen saturation; remained stable throughout the procedure. The patient developed no complications during the procedure. IMPRESSIONS  1. Left ventricular ejection fraction, by estimation, is 65 to 70%. The left ventricle has normal function. The left ventricle has no regional wall motion abnormalities. There is moderate concentric left ventricular hypertrophy. Left ventricular diastolic function could not be evaluated.  2. Right ventricular systolic function is normal. The right ventricular size is normal. Tricuspid regurgitation signal is inadequate for assessing PA pressure.  3. No left atrial/left atrial appendage thrombus was detected.  4. The mitral valve is normal in structure. Trivial mitral valve regurgitation. No evidence of mitral stenosis.  5. The aortic valve is tricuspid. Aortic valve regurgitation is not visualized. No aortic stenosis is present.  6. There is mild (Grade II) plaque involving the descending aorta.  7. Agitated saline contrast bubble study was negative, with no evidence of any interatrial shunt. Conclusion(s)/Recommendation(s): No LA/LAA thrombus identified. Negative bubble study for interatrial shunt. No intracardiac source of embolism detected on this on this transesophageal echocardiogram. FINDINGS  Left Ventricle: Left ventricular ejection fraction, by estimation, is 65 to 70%. The left ventricle has normal function. The left ventricle has no regional wall motion abnormalities. The left ventricular internal cavity size was normal in size. There is  moderate concentric left ventricular hypertrophy. Left ventricular diastolic function could not be evaluated. Right Ventricle: The right ventricular size is normal. No increase  in right ventricular wall thickness. Right ventricular systolic function is normal. Tricuspid regurgitation signal is inadequate for assessing PA pressure. Left Atrium: Left atrial size was not assessed. No left atrial/left atrial appendage thrombus was detected. Right Atrium: Right atrial size was not assessed. Pericardium: There is no evidence of pericardial effusion. Mitral Valve: The mitral valve is normal in structure. Trivial mitral valve  regurgitation. No evidence of mitral valve stenosis. Tricuspid Valve: The tricuspid valve is normal in structure. Tricuspid valve regurgitation is trivial. No evidence of tricuspid stenosis. Aortic Valve: The aortic valve is tricuspid. Aortic valve regurgitation is not visualized. No aortic stenosis is present. Pulmonic Valve: The pulmonic valve was grossly normal. Pulmonic valve regurgitation is trivial. No evidence of pulmonic stenosis. Aorta: The aortic root is normal in size and structure. There is mild (Grade II) plaque involving the descending aorta. IAS/Shunts: No atrial level shunt detected by color flow Doppler. Agitated saline contrast was given intravenously to evaluate for intracardiac shunting. Agitated saline contrast bubble study was negative, with no evidence of any interatrial shunt. There  is no evidence of a patent foramen ovale. Buford Dresser MD Electronically signed by Buford Dresser MD Signature Date/Time: 06/08/2019/2:06:11 PM    Final    IR PERCUTANEOUS ART THROMBECTOMY/INFUSION INTRACRANIAL INC DIAG ANGIO  Result Date: 06/04/2019 CLINICAL DATA:  59 year old male with past medical history significant for colon cancer in 2012. He developed left-sided weakness and neglect on awakening this morning. He was taken to [facility] hospital. Head CT showed small hypodensity in the right insula with no hemorrhage. NIHSS 10. CT angiogram showed tapering of the right M1/MCA with short-segment filling defect. CT perfusion showed an  estimated core volume of 25 mL with a mismatch of 126 mL. He was transferred to our service for emergency treatment of the right M1/MCA occlusion. EXAM: IR CT HEAD LIMITED; IR PERCUTANEOUS ART THORMBECTOMY/INFUSION INTRACRANIAL INCLUDE DIAG ANGIO; IR ULTRASOUND GUIDANCE VASC ACCESS RIGHT; INTRACRANIAL STENT (INCL PTA) ANESTHESIA/SEDATION: General anesthesia. MEDICATIONS: Intra arterial Verapamil 5 mg; intra arterial Integrilin 19 mg. CONTRAST:  73m OMNIPAQUE IOHEXOL 300 MG/ML SOLN, 715mOMNIPAQUE IOHEXOL 240 MG/ML SOLN PROCEDURE: Operator: Dr. KaPedro EarlsMD. No healthcare proxy or next of kin available for informed consent. Discussion held with the neurohospitalist attending. Agreement reached that the procedure would be in the best interest of the patient and benefits outweighed risks. The patient was made to lie supine on the angiography table. He was prepped and draped utilizing the usual sterile technique. Using gray scale ultrasound-guided, a micropuncture kit and modified Seldinger technique, access was gained to the right common femoral artery. An 8 French femoral sheath was then placed. Under fluoroscopy, an 8 FrPak[place]alrus balloon guide catheter was navigated over a 6 FrPakistanerenstein 2 catheter and a 0.038 inch Terumo Glidewire into the aortic arch. Under fluoroscopy, the catheter was advanced into the right common carotid artery and then into the right internal carotid artery. Frontal and lateral angiograms of the head were obtained showing a distal right M1/MCA occlusion with minimal delayed penetration of contrast. A large bore aspiration catheter was then navigated through the walrus balloon guide catheter and over a phenom 21 microcatheter and a synchro support microguidewire into the cavernous segment of the right ICA. The microcatheter was then advanced over the microwire into a right M1/MCA middle division branch. A 6 mm solitaire stent retriever was deployed spanning the right  M1/MCA and proximal M2/middle division branch. Device was allowed to intercalated with the clot for 4 minutes. The microcatheter was removed under fluoroscopy. The guiding catheter balloon was inflated at the level of the proximal petrous segment. Thrombectomy device and aspiration catheter were then removed under constant aspiration. Follow-up angiogram showed recanalization of the M1 segment with subocclusive filling defect and slow flow in distal MCA branches (TICI 2C). A large bore aspiration catheter was again navigated through the walrus  balloon guide catheter and over a phenom 21 microcatheter and a synchro support microguidewire into the cavernous segment of the right ICA. The microcatheter was then advanced over the microwire into a right M1/MCA posterior division branch. A 6 mm solitaire stent retriever was deployed spanning the right M1/MCA and proximal M2 posterior division branch. Device was allowed to intercalated with the clot for 4 minutes. The microcatheter was removed under fluoroscopy. The guiding catheter balloon was inflated at the level of the proximal petrous segment. Thrombectomy device and aspiration catheter were then removed under constant aspiration. Follow-up angiogram show normal forward flow into the right MCA vascular territory noting a small dissection flap in the mid/distal right M1/MCA segment. Delayed follow-up angiogram showed progressive reocclusion of the right M1/MCA with minimal forward flow. Flat panel CT of the head was obtained and post processed in a separate workstation under concurrent attending physician supervision. No evidence of hemorrhagic complication noted. Right ICA angiograms with frontal lateral views of the head were obtained. Further progression of occlusion in the right M1 segment was noted. Small non flow limiting iatrogenic dissection in the upper cervical segment of the right ICA identified. The large bore aspiration catheter was navigated through the  walrus balloon guide catheter and over a SL 10 microcatheter and a synchro support microguidewire into the cavernous segment of the right ICA. The microcatheter was then advanced over the microwire into a right M1/MCA posterior division branch. Intra arterial infusion of 19 mg of Integrilin was performed into the right ICA. Subsequently, a 3 mm x 15 mm atlas intracranial stent was deployed in the M1 segment across the dissection flap. Follow-up angiogram showed adequate stent positioning with complete restoration of the anterograde flow in the right MCA. Delayed follow-up angiogram (x2) show no evidence of reocclusion. 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 Postprocedural flat panel CT of the head was obtained and post processed in a separate workstation under concurrent attending physician supervision. No evidence of hemorrhagic complication noted. The catheter system was withdrawn. A right common femoral artery angiogram was obtained showing access at the level of the right common femoral artery. The femoral sheath was removed and the access was closed with a Perclose ProGlide. Immediate hemostasis achieved. 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 FINDINGS: Dissection of the mid/distal right M1/MCA. IMPRESSION: 1. Mechanical thrombectomy performed (x2) with temporary restoration of flow revealing dissection flap in the mid/distal right M1/MCA segment. 2. Intracranial stenting placed in the right M1/MCA across the dissection flap with complete restoration of flow. 3. No hemorrhagic complication on postprocedural flat panel CT. No embolus to new territory. PLAN: Patient is to remain on Integrilin drip. A follow-up head CT will be obtained at 6:30 p.m. today. At this point, patient will be re-evaluated for transition to oral dual anti-platelet therapy. Electronically Signed   By: Pedro Earls M.D.   On: 06/04/2019 17:40   CT HEAD CODE STROKE WO CONTRAST  Addendum Date: 06/04/2019   ADDENDUM REPORT: 06/04/2019 12:21 ADDENDUM: Contrast dose is 100 mL Omnipaque 350 Electronically Signed   By: Macy Mis M.D.   On: 06/04/2019 12:21   Result Date: 06/04/2019 CLINICAL DATA:  Code stroke.  Slurred speech and left facial droop EXAM: CT ANGIOGRAPHY HEAD AND NECK CT PERFUSION BRAIN TECHNIQUE: Multidetector CT imaging of the head and neck was performed using the standard protocol during bolus administration of intravenous contrast.  Multiplanar CT image reconstructions and MIPs were obtained to evaluate the vascular anatomy. Carotid stenosis measurements (when applicable) are obtained utilizing NASCET criteria, using the distal internal carotid diameter as the denominator. Multiphase CT imaging of the brain was performed following IV bolus contrast injection. Subsequent parametric perfusion maps were calculated using RAPID software. COMPARISON:  None. FINDINGS: CT HEAD Brain: There is no acute intracranial hemorrhage mass effect edema. Gray-white differentiation is preserved. Ventricles and sulci normal in size and configuration. There is no extra-axial fluid collection. Vascular: Hyperdense vessel. Skull: Unremarkable. Sinuses/Orbits: Lobular mucosal thickening with greatest involvement of the right maxillary sinus. No acute orbital finding. Other: Mastoid air cells are clear ASPECTS [facility] Stroke Program Early CT Score) - Ganglionic level infarction (caudate, lentiform nuclei, internal capsule, insula, M1-M3 cortex): 7 - Supraganglionic infarction (M4-M6 cortex): 3 Total score (0-10 with 10 being normal): 10 Review of the MIP images confirms the above findings CTA NECK Aortic arch: Great vessel origins are patent. Right carotid system: Patent. There is trace calcified plaque at the ICA origin measurable stenosis. Left carotid system: Patent. Eccentric noncalcified plaque causing less than 50%  stenosis. Mild calcified plaque at the ICA origin causing less than 50% stenosis. Vertebral arteries: Patent.  Left vertebral artery dominant. Skeleton: Cervical spine degenerative changes. Other neck: No mass or adenopathy Upper chest: No apical lung mass. Review of the MIP images confirms the above findings CTA HEAD Anterior circulation: Intracranial internal carotid arteries patent. There is nonocclusive thrombus within distal right M1 MCA extending to the bifurcation. Left middle cerebral artery is patent. Anterior cerebral arteries are patent. Congenitally absent right A1 ACA. Posterior circulation: Intracranial vertebral arteries, basilar artery, and posterior cerebral arteries are patent. There are bilateral patent posterior communicating arteries. Venous sinuses: As permitted by contrast timing, patent. Review of the MIP images confirms the above findings CT Brain Perfusion: CBF (<30%) Volume: 4m Perfusion (Tmax>6.0s) volume: 1561mMismatch Volume: 12657mnfarction Location: Right MCA territory IMPRESSION: No acute intracranial hemorrhage or evidence of acute infarction. ASPECT score is 10. 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 Perfusion imaging demonstrates 25 mL core infarction and 126 mL territory at risk in the right MCA territory. No hemodynamically significant stenosis in the neck. These results were called by telephone at the time of interpretation on 06/04/2019 at 11:53 am to provider JONCommunity Hospital EastLESAlexis Goodellwho verbally acknowledged these results. Electronically Signed: By: PraMacy MisD. On: 06/04/2019 11:59   VAS US [place] EXTREMITY VENOUS (DVT)  Result Date: 06/06/2019  Lower Venous DVTStudy Indications: Stroke.  Comparison Study: No prior study on file Performing Technologist: CanSharion DoveS  Examination Guidelines: A complete evaluation includes B-mode imaging, spectral Doppler, color Doppler, and power Doppler as needed of  all accessible portions of each vessel. Bilateral testing is considered an integral part of a complete examination. Limited examinations for reoccurring indications may be performed as noted. The reflux portion of the exam is performed with the patient in reverse Trendelenburg.  +---------+---------------+---------+-----------+----------+--------------+ RIGHT    CompressibilityPhasicitySpontaneityPropertiesThrombus Aging +---------+---------------+---------+-----------+----------+--------------+ CFV      Full           Yes      Yes                                 +---------+---------------+---------+-----------+----------+--------------+ SFJ      Full                                                        +---------+---------------+---------+-----------+----------+--------------+  FV Prox  Full                                                        +---------+---------------+---------+-----------+----------+--------------+ FV Mid   Full                                                        +---------+---------------+---------+-----------+----------+--------------+ FV DistalFull                                                        +---------+---------------+---------+-----------+----------+--------------+ PFV      Full                                                        +---------+---------------+---------+-----------+----------+--------------+ POP      Full           Yes      Yes                                 +---------+---------------+---------+-----------+----------+--------------+ PTV      Full                                                        +---------+---------------+---------+-----------+----------+--------------+ PERO     Full                                                        +---------+---------------+---------+-----------+----------+--------------+    +---------+---------------+---------+-----------+----------+--------------+ LEFT     CompressibilityPhasicitySpontaneityPropertiesThrombus Aging +---------+---------------+---------+-----------+----------+--------------+ CFV      Full           Yes      Yes                                 +---------+---------------+---------+-----------+----------+--------------+ SFJ      Full                                                        +---------+---------------+---------+-----------+----------+--------------+ FV Prox  Full                                                        +---------+---------------+---------+-----------+----------+--------------+  FV Mid   Full                                                        +---------+---------------+---------+-----------+----------+--------------+ FV DistalFull                                                        +---------+---------------+---------+-----------+----------+--------------+ PFV      Full                                                        +---------+---------------+---------+-----------+----------+--------------+ POP      Full           Yes      Yes                                 +---------+---------------+---------+-----------+----------+--------------+ PTV      Full                                                        +---------+---------------+---------+-----------+----------+--------------+ PERO     Full                                                        +---------+---------------+---------+-----------+----------+--------------+     Summary: BILATERAL: - No evidence of deep vein thrombosis seen in the lower extremities, bilaterally.   *See table(s) above for measurements and observations. Electronically signed by Curt Jews MD on 06/06/2019 at 3:51:27 PM.    Final     Labs:  Basic Metabolic Panel: Recent Labs  Lab 06/09/19 0432 06/11/19 0543  NA 137 137  K 3.3* 3.8   CL 105 101  CO2 19* 25  GLUCOSE 128* 133*  BUN 8 10  CREATININE 0.85 1.07  CALCIUM 9.0 9.0    CBC: Recent Labs  Lab 06/09/19 0432 06/11/19 0543  WBC 13.4* 10.9*  NEUTROABS  --  8.1*  HGB 15.0 15.1  HCT 43.1 44.5  MCV 91.9 93.3  PLT 212 236    CBG: Recent Labs  Lab 06/13/19 2107 06/14/19 0607 06/14/19 1153 06/14/19 1630 06/14/19 2128  GLUCAP 138* 129* 104* 154* 127*   Family history.  Mother and father with hypertension as well as hyperlipidemia prediabetes.  Denies any colon cancer esophageal cancer rectal cancer  Brief HPI:   Scott Day is a 59 y.o. right-handed male with history of colon cancer 2012, GERD, tobacco abuse.  Per chart review lives alone independent prior to admission.  Presented 06/04/2019 with left-sided weakness and slurred speech.  Admission chemistries unremarkable except glucose 213, alcohol negative.  Cranial CT scan negative  for acute changes.  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  Patient did not receive TPA.  Underwent revascularization stent placement per interventional radiology.  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  Echocardiogram with ejection fraction of 70% without emboli.  Lower extremity Dopplers negative for DVT.  Maintained on aspirin as well as Brilinta for CVA prophylaxis.  Close monitoring of blood pressure initially on Cardene.  TEE showed ejection fraction of 65% without thrombus and underwent loop recorder placement.  Findings of elevated hemoglobin A1c of 8.3 placed on sliding scale insulin.  Urinalysis study 06/09/2019 elevated WBC 13,400 urine positive nitrite greater than 50,000 WBCs.  Mechanical soft diet.  Therapy evaluations completed and patient was admitted for a comprehensive rehab program.   Hospital Course: LUCCAS TOWELL was admitted to rehab 06/09/2019 for inpatient therapies to consist of PT, ST and OT at least  three hours five days a week. 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  He remained on aspirin as well as Brilinta he would follow-up with both neurology services as well as interventional radiology.  SCDs for DVT prophylaxis venous Doppler studies negative..  Blood pressure controlled and monitored currently on no antihypertensive medications.  Lipitor ongoing for hyperlipidemia.  Findings of E. coli UTI completing course of Keflex no dysuria or hematuria.  New findings hemoglobin A1c 8.3 diabetes mellitus Metformin initiated full diabetic teaching completed patient would need follow-up primary MD.  History of tobacco abuse NicoDerm patch again received counts regards to cessation of nicotine products.   Blood pressures were monitored on TID basis and controlled  Diabetes has been monitored with ac/hs CBG checks and SSI was use prn for tighter BS control.   He/ is continent of bowel and bladder.  He/ has made gains during rehab stay and is attending therapies  He/ will continue to receive follow up therapies   after discharge  Rehab course: During patient's stay in rehab weekly team conferences were held to monitor patient's progress, set goals and discuss barriers to discharge. At admission, patient required moderate assist 25 feet to person hand-held assist, minimal guard sit to supine minimal assist supine to sit.  Minimal assist upper body bathing moderate assist lower body bathing minimal assist upper body dressing max assist lower body dressing  Physical exam.  Blood pressure 137/72 pulse 81 temperature 98 respirations 18 oxygen saturation 97% room air Constitutional.  Alert and oriented no acute distress HEENT Head.  Normocephalic and atraumatic Eyes.  Pupils round and reactive to  light no discharge without nystagmus Neck.  Supple nontender no JVD without thyromegaly Cardiac regular rate rhythm without any extra sounds or murmur heard Abdomen.  Soft nontender positive bowel sounds without rebound Respiratory effort normal no respiratory distress without wheeze Extremities no clubbing cyanosis or edema Skin.  No evidence of breakdown or rash Neurological.  Mildly dysarthric speech but intelligible provides his name age and date of birth.  Mild right gaze preference.  Cranial nerves II through XII intact. Motor strength 5 out of 5 right and 4 out of 5 left deltoid bicep tricep grip hip flexors knee extensors dorsiflexion plantarflexion.  Sensation mildly reduced light touch left upper and lower extremities   He/  has had improvement in activity tolerance, balance, postural control  as well as ability to compensate for deficits. He/ has had improvement in functional use RUE/LUE  and RLE/LLE as well as improvement in awareness.  Patient ambulates at 1000 feet supervision no assistive device ambulates outside community reintegration on uneven surfaces ramps supervision.  Up-and-down 4 flights of stairs supervision.  Gather his belongings for activities day living and homemaking.  Full teaching completed plan discharge to home       Disposition: Discharge to home    Diet: Diabetic diet  Special Instructions: No driving smoking or alcohol  Medications at discharge 1.  Tylenol as needed 2.  Aspirin 81 mg daily 3.  Lipitor 80 mg daily 4.  Keflex 500 mg every 6 hours x3 days and stop 5.  Glucophage 250 mg daily 6.  NicoDerm patch taper as directed 7.  Brilinta 90 mg twice daily  Discharge Instructions     Ambulatory referral to Neurology   Complete by: As directed    Follow up with stroke clinic NP (Jessica Vanschaick or Cecille Rubin, if both not available, consider Zachery Dauer, or Ahern) at [facility] in about 4 weeks. Thanks.   Ambulatory referral to Neurology    Complete by: As directed    An appointment is requested in approximately 4 weeks right MCA infarction   Ambulatory referral to Physical Medicine Rehab   Complete by: As directed    Moderate complexity follow-up 1 to 2 weeks right MCA infarction       Follow-up Information     [facility]. Schedule an appointment as soon as possible for a visit in 4 week(s).   Specialty: Neurology Contact information: [address] [place] [phone]        Lelon Perla, MD Follow up.   Specialty: Cardiology Why: Call for appointment Contact information: [place] [address] [phone]         de Rosario Jacks, MD Follow up.   Specialties: Radiology, Interventional Radiology Why: Call for appointment Contact information: [place] [address] [phone]         Jamse Arn, MD Follow up.   Specialty: Physical Medicine and Rehabilitation Why: Office to call for appointment Contact information: [address] [place] [address] [phone]            Signed: Cathlyn Parsons 06/15/2019, 5:19 AM .Patient was seen, face-face, and physical exam performed by me on day of discharge, less than 30 minutes of total time spent.. Please see progress note from day of discharge as well.  Delice Lesch, MD, ABPMR

## 2019-06-14 NOTE — Progress Notes (Signed)
Team Conference Report to Patient/Family  Team Conference discussion was reviewed with the patient and son, including goals, any changes in plan of care and target discharge date.  Patient and son expressed understanding and are in agreement.  The patient has a target discharge date of 06/16/19. The patient expressed desire to go home tomorrow or Friday if possible. Noted the need to stay through Saturday to wokr on medical issues and cognition and assure he would be ready for discharge. MATCH discount card obtained for the patient and reviewed process with the patient and his son. Set up an appointment with a PCP in [facility] and information given to patient for the appointment with directions and the date/time, etc. Also working to secure OP services for the patient at [facility] re[facility], [place].  Scott Day B 06/14/2019, 4:11 PM

## 2019-06-14 NOTE — Progress Notes (Signed)
[facility] PHYSICAL MEDICINE & REHABILITATION PROGRESS NOTE   Subjective/Complaints: Patient seen sitting up in bed this morning eating breakfast.  He states he slept well overnight.  He is excited about his discharge date.  ROS: Denies CP, SOB N/V/D  Objective:   No results found. No results for input(s): WBC, HGB, HCT, PLT in the last 72 hours. No results for input(s): NA, K, CL, CO2, GLUCOSE, BUN, CREATININE, CALCIUM in the last 72 hours.  Intake/Output Summary (Last 24 hours) at 06/14/2019 1004 Last data filed at 06/14/2019 [id number] Gross per 24 hour  Intake 702 ml  Output --  Net 702 ml     Physical Exam: Vital Signs Blood pressure (!) 104/58, pulse 71, temperature 97.7 F (36.5 C), temperature source Oral, resp. rate 18, SpO2 95 %. Constitutional: No distress . Vital signs reviewed. HENT: Normocephalic.  Atraumatic. Eyes: EOMI. No discharge. Cardiovascular: No JVD. Respiratory: Normal effort.  No stridor. GI: Non-distended. Skin: Warm and dry.  Intact. Psych: Delayed. Musc: No edema in extremities.  No tenderness in extremities. Neurologic: Alert Motor:  LUE/LLE: 4+-5/5 proximal distal, unchanged  Assessment/Plan: 1. 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  Physiatrist is providing close team supervision and 24 hour management of active medical problems listed below.  Physiatrist and rehab team continue to assess barriers to discharge/monitor patient progress toward functional and medical goals  Care Tool:  Bathing    Body parts bathed by patient: Right arm, Left arm, Chest, Abdomen, Front perineal area, Buttocks, Right upper leg, Left upper leg, Right lower leg, Left lower leg, Face         Bathing assist Assist Level: Supervision/Verbal cueing     Upper Body Dressing/Undressing Upper body dressing   What is the patient wearing?: Pull over  shirt    Upper body assist Assist Level: Supervision/Verbal cueing    Lower Body Dressing/Undressing Lower body dressing      What is the patient wearing?: Underwear/pull up, Pants     Lower body assist Assist for lower body dressing: Supervision/Verbal cueing     Toileting Toileting    Toileting assist Assist for toileting: Supervision/Verbal cueing     Transfers Chair/bed transfer  Transfers assist     Chair/bed transfer assist level: Supervision/Verbal cueing     Locomotion Ambulation   Ambulation assist      Assist level: Supervision/Verbal cueing Assistive device: No Device Max distance: 250'   Walk 10 feet activity   Assist     Assist level: Supervision/Verbal cueing Assistive device: No Device   Walk 50 feet activity   Assist    Assist level: Supervision/Verbal cueing Assistive device: No Device    Walk 150 feet activity   Assist    Assist level: Supervision/Verbal cueing Assistive device: No Device    Walk 10 feet on uneven surface  activity   Assist     Assist level: Supervision/Verbal cueing Assistive device: Other (comment)(no AD)   Wheelchair     Assist Will patient use wheelchair at discharge?: No Type of Wheelchair: Manual Wheelchair activity did not occur: N/A         Wheelchair 50 feet with 2 turns activity    Assist    Wheelchair 50 feet with 2 turns activity did not occur: N/A       Wheelchair 150 feet activity     Assist  Wheelchair 150 feet activity did not  occur: N/A       Blood pressure (!) 104/58, pulse 71, temperature 97.7 F (36.5 C), temperature source Oral, resp. rate 18, SpO2 95 %.    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 Status post loop recorder  Continue CIR 2. Antithrombotics: -DVT/anticoagulation:SCDs.Lower extremity Dopplers  negative. -antiplatelet therapy: Aspirin 81 mg daily, Brilinta 90 mg twice daily 3. Pain Management:Tylenol as needed 4. Mood:Provide emotional support -antipsychotic agents: N/A 5. Neuropsych: This patientiscapable of making decisions on hisown behalf. 6. Skin/Wound Care:Routine skin checks 7. Fluids/Electrolytes/Nutrition:Routine in and outs 8. Hypertension. On no antihypertensive medication prior to admission. Monitor with increased mobility  Controlled on 3/25 9. Hyperlipidemia. Lipitor 10. New findings of diabetes mellitus. Hemoglobin A1c 8.3. SSI. Diabetic teaching CBG (last 3)  Recent Labs    06/13/19 1644 06/13/19 2107 06/14/19 0607  GLUCAP 146* 138* 129*   Metformin 250 daily started 3/22  Labile, but?  Stabilizing on 3/25 11.  E. 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  Tobacco abuse  Nicotine patch 14. Leukocytosis  WBCs 10.9 on 3/22  Afebrile  Cont to monitor  LOS: 5 days A FACE TO FACE EVALUATION WAS PERFORMED  Lilliahna Schubring Lorie Phenix 06/14/2019, 10:04 AM

## 2019-06-14 NOTE — Progress Notes (Signed)
Physical Therapy Session Note  Patient Details  Name: Scott Day MRN: [id number] Date of Birth: 08/15/1960  Today's Date: 06/14/2019 PT Individual Time: [id number] PT Individual Time Calculation (min): 75 min   Short Term Goals: Week 1:  PT Short Term Goal 1 (Week 1): STG=LTGs due to LOS  Skilled Therapeutic Interventions/Progress Updates:   Pt received in supine and agreeable to therapy. No report of pain. Bed morbility independent. Pt performs transfers and ambulation with supervision for safety and verbal cues. Ambulation >1000' throughout session. Occasional cues to attend to L side as pt demonstrates L inattention. Ambulates on level surfaces, up and down graded ramps, and over unlevel/unpaved ground. No LOBs noted.  Pt performs cognitive and memory challenges to improve spatial awareness. Requires frequent cues to remain on task with tendency to become distracted. Consistent difficulty attending to objects and patterns in left visual field, even when redirected. Pt demonstrates functional memory but problem solving impaired and requires increased time, even with additional cueing.   Pt ambulates back to room. Left in supine with alarm intact. All needs within reach.     Therapy Documentation Precautions:  Precautions Precautions: Fall Precaution Comments: safety, impulsive. Restrictions Weight Bearing Restrictions: No    Therapy/Group: Individual Therapy  Breck Coons PT, DPT 06/14/2019, 4:16 PM

## 2019-06-14 NOTE — Progress Notes (Addendum)
Speech Language Pathology Daily Session Note  Patient Details  Name: Scott Day MRN: [id number] Date of Birth: 1960-09-02  Today's Date: 06/14/2019 SLP Individual Time: 1400-1500 SLP Individual Time Calculation (min): 60 min  Short Term Goals: Week 1: SLP Short Term Goal 1 (Week 1): Patient will demonstrate efficient mastication and oral clerance of rgular textures over 2 observed sessions to demonstrate readiness for diet advancement. SLP Short Term Goal 2 (Week 1): Patient will complete higher level reasoning and executive functioning tasks (ex: medication and money management) with 90% accuracy and min cues. SLP Short Term Goal 3 (Week 1): Patient will demonstrate functional problem sovling for basic and familiar (mildly complex) tasks with min verbal cues. SLP Short Term Goal 4 (Week 1): Patient will utilize external memory aids to recall new, daily information with min verbal cues. SLP Short Term Goal 5 (Week 1): Patient will demonstrate sustained attention to functional task for 20 minutes with min verbal cues for redirection.  Skilled Therapeutic Interventions:  Skilled treatment session targeted pt's cognition goals. SLP facilitated session by administering the Cognitive Linguistic Quick Test. Pt had the following severity ratings: Attention - Moderate Impairment Memory - Moderate Impairment Executive functions - Severe Impairments Language - WNL Visuospatial Skills - Severe impairments Clock Drawing - Severe Impairments  Pt also demonstrated left inattention and no error awareness. SLP prompted error awareness by reviewing tasks with pt. He had significant difficulty applying the errors within the task to functional daily activities. Pt was very frustrated with thought of not immediately driving or returning to work.        Pain    Therapy/Group: Individual Therapy  Andrw Mcguirt 06/14/2019, 4:58 PM

## 2019-06-14 NOTE — Progress Notes (Signed)
Occupational Therapy Session Note  Patient Details  Name: Scott Day MRN: [id number] Date of Birth: 07/08/1960  Today's Date: 06/14/2019 OT Individual Time: [id number] OT Individual Time Calculation (min): 60 min    Short Term Goals: Week 1:  OT Short Term Goal 1 (Week 1): STGs=LTGs due to ELOS  Skilled Therapeutic Interventions/Progress Updates:      Pt seen for BADL retraining of toileting, bathing, and dressing with a focus on balance, cognition, left visual field awareness.  Pt did extremely well completing all tasks in a timely manner with no LOB or cues.  When he was cleaning up the bed with his dirty laundry, he collected all his clothing but left behind 1 white sock on white sheets. He needed a cue that it was left behind but he was able to find it with extra time and scanning.  Ambulated to gym to work on standing balance activities: -[place] with nuts and bolts board using L hand well - rebounder with 2 lb med ball -tossing playground ball up high on wall to ceiling and catching with a squat -moving furniture around   Integrated cognition with naming tools he uses on a daily basis, but pt loves to talk and gets himself off focus.    Pt ambulated back to room with no cues needed to find his room.  Pt in room with bed alarm set.   Therapy Documentation Precautions:  Precautions Precautions: Fall Precaution Comments: safety, impulsive. Restrictions Weight Bearing Restrictions: No      Pain: Pain Assessment Pain Score: 0-No pain ADL: ADL Eating: Independent Grooming: Independent Where Assessed-Grooming: Standing at sink Upper Body Bathing: Independent Where Assessed-Upper Body Bathing: Shower Lower Body Bathing: Modified independent Where Assessed-Lower Body Bathing: Shower Upper Body Dressing: Independent Where Assessed-Upper Body Dressing: Edge of bed Lower Body Dressing: Modified independent Where Assessed-Lower Body Dressing: Edge of bed Toileting:  Modified independent Where Assessed-Toileting: Glass blower/designer: Close supervision Armed forces technical officer Method: Magazine features editor: Close supervision Social research officer, government Method: Heritage manager: Shower seat with back  Therapy/Group: Individual Therapy  [place] 06/14/2019, 10:44 AM

## 2019-06-15 ENCOUNTER — Inpatient Hospital Stay (HOSPITAL_COMMUNITY): Payer: Self-pay | Admitting: Speech Pathology

## 2019-06-15 ENCOUNTER — Inpatient Hospital Stay (HOSPITAL_COMMUNITY): Payer: Self-pay

## 2019-06-15 ENCOUNTER — Inpatient Hospital Stay (HOSPITAL_COMMUNITY): Payer: Self-pay | Admitting: Physical Therapy

## 2019-06-15 ENCOUNTER — Inpatient Hospital Stay (HOSPITAL_COMMUNITY): Payer: Self-pay | Admitting: Occupational Therapy

## 2019-06-15 LAB — GLUCOSE, CAPILLARY
Glucose-Capillary: 125 mg/dL — ABNORMAL HIGH (ref 70–99)
Glucose-Capillary: 79 mg/dL (ref 70–99)
Glucose-Capillary: 146 mg/dL — ABNORMAL HIGH (ref 70–99)
Glucose-Capillary: 142 mg/dL — ABNORMAL HIGH (ref 70–99)

## 2019-06-15 NOTE — Progress Notes (Signed)
Speech Language Pathology Discharge Summary  Patient Details  Name: Scott Day MRN: [id number] Date of Birth: 30-Jul-1960  Today's Date: 06/15/2019 SLP Individual Time: 0830-0930 SLP Individual Time Calculation (min): 60 min   Skilled Therapeutic Interventions:  Skilled treatment session targeted pt's cognition goals. Pt stated that MD told him he was discharging today at 10 am. Pt very resistive to correction even with physical schedule present. Pt also very agitated about previous days therapy session as he "was worried about not being to drive his motorcycle." Education provided. SLP also facilitated session by providing basic cancellation to task to targeted sustained attention, error awareness and organization of task. Pt with improved task organization and cancelled 83% accurately. While pt didn't speak during task, he gave this Probation officer the peace sign several time.   Pt's daughter had concerns about level of cognitive deficits. 30 minutes of non billable time were spent provided examples and education on pt's moderate cognitive deficits and poor task tolerance. All questions were answered to her satisfaction.    Patient has met 4 of 4 long term goals.  Patient to discharge at overall Mod level.  Reasons goals not met:   N/A  Clinical Impression/Discharge Summary:   Pt has made minimal progress during skilled ST sessions d/t severity of impairments and poor task tolerance. Pt currently has moderate deficits in sustained attention, left inattention, semi-complex problem solving, executive functions, emergent awareness and safety awareness. As such OPST is recommended to target this deficits and to increase pt's functional independence and reduce caregiver burden.   Care Partner:  Caregiver Able to Provide Assistance: Yes  Type of Caregiver Assistance: Cognitive  Recommendation:  Outpatient SLP  Rationale for SLP Follow Up: Maximize cognitive function and independence;Reduce caregiver  burden   Equipment:   N/A  Reasons for discharge: Discharged from hospital   Patient/Family Agrees with Progress Made and Goals Achieved: Yes    Scott Day 06/15/2019, 4:49 PM

## 2019-06-15 NOTE — Progress Notes (Signed)
Occupational Therapy Discharge Summary  Patient Details  Name: AUBERY DOUTHAT MRN: [id number] Date of Birth: 1960-09-25  Patient has met 8 of 8 long term goals due to improved activity tolerance, improved balance, postural control, functional use of  LEFT upper and LEFT lower extremity, improved attention and improved coordination.  Patient to discharge at overall Supervision level.  Patient's care partner is independent to provide the necessary physical and cognitive assistance at discharge.  Pt had supervision goals set for BADLS but due to improved balance, coordination, attention to basic tasks he is now independent with his self care, except supervision to get in and out of the shower.    He does need SUPERVISION with all IADLs due to very limited cognitive skills of awareness, attention, initiation, memory.  Pt has engaged in risky behavior in the past and continues to be at high risk for returning to that behavior.  Have emphasized on a daily basis NO driving (truck, motorcycle, lawnmower), no drugs and no more than 1-2 beers a day (pt used to consume 24 beers a day).      Reasons goals not met: n/a  Recommendation:  Patient will benefit from ongoing skilled OT services in outpatient setting to continue to advance functional skills in the area of iADL and Vocation.  Equipment: No equipment provided  Reasons for discharge: treatment goals met  Patient/family agrees with progress made and goals achieved: Yes  OT Discharge Precautions/Restrictions  Precautions Precaution Comments: safety, impulsive. ADL ADL Eating: Independent Grooming: Independent Where Assessed-Grooming: Standing at sink Upper Body Bathing: Independent Where Assessed-Upper Body Bathing: Shower Lower Body Bathing: Modified independent Where Assessed-Lower Body Bathing: Shower Upper Body Dressing: Independent Where Assessed-Upper Body Dressing: Edge of bed Lower Body Dressing: Independent Where  Assessed-Lower Body Dressing: Edge of bed Toileting: Independent Where Assessed-Toileting: Glass blower/designer: Programmer, applications Method: Magazine features editor: Close supervision Social research officer, government Method: Heritage manager: Civil engineer, contracting with back [employer] of [employer]: Within [employer] Alignment/Gaze Preference: Gaze right(pt can now shift his eyes to the Left but tends to keep his visual focus to his R) Visual Fields: (on testing, pt identifies objects in L visual field, functionally he demonstrates a field loss) Diplopia Assessment: (pt denies diplopia) Perception  Perception: Impaired Inattention/Neglect: Does not attend to left visual field(improved from admission, but still needs cues to look to his left to find things) Praxis Praxis: Intact Cognition Overall Cognitive Status: Impaired/Different from baseline Attention: Focused;Sustained Focused Attention: Impaired Focused Attention Impairment: Verbal basic;Functional basic Sustained Attention: Impaired Sustained Attention Impairment: Verbal basic;Functional basic Alternating Attention: Impaired Memory: Impaired Memory Impairment: Decreased short term memory Awareness: Impaired Awareness Impairment: Intellectual impairment;Emergent impairment;Anticipatory impairment Problem Solving: Impaired Problem Solving Impairment: Functional complex;Verbal complex Executive Function: Reasoning Reasoning: Impaired Reasoning Impairment: Verbal complex;Functional basic Initiating: Impaired Initiating Impairment: Verbal complex;Functional basic Self Correcting: Impaired Self Correcting Impairment: Verbal complex;Functional basic Behaviors: Impulsive Safety/Judgment: Impaired Sensation Sensation Light Touch: Appears Intact Hot/Cold: Appears Intact Proprioception: Appears Intact Stereognosis: Appears Intact Coordination Gross Motor Movements are Fluid and Coordinated:  Yes Fine Motor Movements are Fluid and Coordinated: Yes Finger Nose Finger Test: [facility] 9 Hole Peg Test: left 33 sec, right 28 sec (wnl) Motor  Motor Motor - Discharge Observations: WFL Mobility  Transfers Sit to Stand: Independent Stand to Sit: Independent  Trunk/Postural Assessment  Cervical Assessment Cervical Assessment: Within Functional Limits Thoracic Assessment Thoracic Assessment: Within Functional Limits Lumbar Assessment Lumbar Assessment: Within Functional Limits Postural Control Postural Control: Within  Functional Limits  Balance Dynamic Sitting Balance Dynamic Sitting - Level of Assistance: 7: Independent Dynamic Standing Balance Dynamic Standing - Level of Assistance: 7: Independent Extremity/Trunk Assessment RUE Assessment RUE Assessment: Within Functional Limits LUE Assessment LUE Assessment: Within Functional Limits   SAGUIER,JULIA 06/15/2019, 12:47 PM

## 2019-06-15 NOTE — Discharge Instructions (Signed)
Inpatient Rehab Discharge Instructions  Scott Day Discharge date and time: No discharge date for patient encounter.   Activities/Precautions/ Functional Status: Activity: activity as tolerated Diet: diabetic diet Wound Care: none needed Functional status:  ___ No restrictions     ___ Walk up steps independently ___ 24/7 supervision/assistance   ___ Walk up steps with assistance ___ Intermittent supervision/assistance  ___ Bathe/dress independently ___ Walk with walker     _x__ Bathe/dress with assistance ___ Walk Independently    ___ Shower independently ___ Walk with assistance    ___ Shower with assistance ___ No alcohol     ___ Return to work/school ________  COMMUNITY REFERRALS UPON DISCHARGE:  Outpatient: PT, OT,ST  [place] Phone:[phone]  Appointment Date/Time:TBD  [facility] Dr. Chapman Fitch  Special Instructions: No driving smoking or alcohol STROKE/TIA DISCHARGE INSTRUCTIONS SMOKING Cigarette smoking nearly doubles your risk of having a stroke & is the single most alterable risk factor  If you smoke or have smoked in the last 12 months, you are advised to quit smoking for your health.  Most of the excess cardiovascular risk related to smoking disappears within a year of stopping.  Ask you doctor about anti-smoking medications  [place] Quit Line: 1-800-QUIT NOW  Free Smoking Cessation Classes [phone]  CHOLESTEROL Know your levels; limit fat & cholesterol in your diet  Lipid Panel     Component Value Date/Time   CHOL 180 06/05/2019 0543   TRIG 131 06/05/2019 0543   HDL 28 (L) 06/05/2019 0543   CHOLHDL 6.4 06/05/2019 0543   VLDL 26 06/05/2019 0543   LDLCALC 126 (H) 06/05/2019 0543      Many patients benefit from treatment even if their cholesterol is at goal.  Goal: Total Cholesterol (CHOL) less than 160  Goal:  Triglycerides (TRIG) less than 150  Goal:   HDL greater than 40  Goal:  LDL (LDLCALC) less than 100   BLOOD PRESSURE American Stroke Association blood pressure target is less that 120/80 mm/Hg  Your discharge blood pressure is:  BP: 119/64  Monitor your blood pressure  Limit your salt and alcohol intake  Many individuals will require more than one medication for high blood pressure  DIABETES (A1c is a blood sugar average for last 3 months) Goal HGBA1c is under 7% (HBGA1c is blood sugar average for last 3 months)  Diabetes:     Lab Results  Component Value Date   HGBA1C 8.3 (H) 06/05/2019     Your HGBA1c can be lowered with medications, healthy diet, and exercise.  Check your blood sugar as directed by your physician  Call your physician if you experience unexplained or low blood sugars.  PHYSICAL ACTIVITY/REHABILITATION Goal is 30 minutes at least 4 days per week  Activity: Increase activity slowly, Therapies: Physical Therapy: Home Health Return to work:   Activity decreases your risk of heart attack and stroke and makes your heart stronger.  It helps control your weight and blood pressure; helps you relax and can improve your mood.  Participate in a regular exercise program.  Talk with your doctor about the best form of exercise for you (dancing, walking, swimming, cycling).  DIET/WEIGHT Goal is to maintain a healthy weight  Your discharge diet is:  Diet Order            DIET DYS 3 Room service appropriate? Yes; Fluid consistency: Thin  Diet effective now  liquids Your height is:    Your current weight is:   Your Body Mass Index (BMI) is:     Following the type of diet specifically designed for you will help prevent another stroke.  Your goal weight range is:    Your goal Body Mass Index (BMI) is 19-24.  Healthy food habits can help reduce 3 risk factors for stroke:  High cholesterol, hypertension, and excess weight.  RESOURCES Stroke/Support Group:  Call [phone]   STROKE EDUCATION  PROVIDED/REVIEWED AND GIVEN TO PATIENT Stroke warning signs and symptoms How to activate emergency medical system (call 911). Medications prescribed at discharge. Need for follow-up after discharge. Personal risk factors for stroke. Pneumonia vaccine given:  Flu vaccine given:  My questions have been answered, the writing is legible, and I understand these instructions.  I will adhere to these goals & educational materials that have been provided to me after my discharge from the hospital.      My questions have been answered and I understand these instructions. I will adhere to these goals and the provided educational materials after my discharge from the hospital.  Patient/Caregiver Signature _______________________________ Date __________  Clinician Signature _______________________________________ Date __________  Please bring this form and your medication list with you to all your follow-up doctor's appointments.

## 2019-06-15 NOTE — Progress Notes (Signed)
[place] PHYSICAL MEDICINE & REHABILITATION PROGRESS NOTE   Subjective/Complaints: Patient seen lying in bed this morning.  He states he slept well overnight.  He states he is eager for discharge.  ROS: Denies CP, SOB N/V/D  Objective:   No results found. No results for input(s): WBC, HGB, HCT, PLT in the last 72 hours. No results for input(s): NA, K, CL, CO2, GLUCOSE, BUN, CREATININE, CALCIUM in the last 72 hours.  Intake/Output Summary (Last 24 hours) at 06/15/2019 1020 Last data filed at 06/14/2019 1838 Gross per 24 hour  Intake 120 ml  Output --  Net 120 ml     Physical Exam: Vital Signs Blood pressure 113/60, pulse 77, temperature 98 F (36.7 C), resp. rate 18, SpO2 95 %. Constitutional: No distress . Vital signs reviewed. HENT: Normocephalic.  Atraumatic. Eyes: EOMI. No discharge. Cardiovascular: No JVD. Respiratory: Normal effort.  No stridor. GI: Non-distended. Skin: Warm and dry.  Intact. Psych: Slowed. Musc: No edema in extremities.  No tenderness in extremities. Neurologic: Alert Motor:  LUE/LLE: 4+-5/5 proximal distal, stable  Assessment/Plan: 1. 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  Physiatrist is providing close team supervision and 24 hour management of active medical problems listed below.  Physiatrist and rehab team continue to assess barriers to discharge/monitor patient progress toward functional and medical goals  Care Tool:  Bathing    Body parts bathed by patient: Right arm, Left arm, Chest, Abdomen, Front perineal area, Buttocks, Right upper leg, Left upper leg, Right lower leg, Left lower leg, Face         Bathing assist Assist Level: Independent with assistive device     Upper Body Dressing/Undressing Upper body dressing   What is the patient wearing?: Pull over shirt, Button up shirt    Upper body assist Assist  Level: Independent    Lower Body Dressing/Undressing Lower body dressing      What is the patient wearing?: Underwear/pull up, Pants     Lower body assist Assist for lower body dressing: Independent with assitive device     Toileting Toileting    Toileting assist Assist for toileting: Independent with assistive device     Transfers Chair/bed transfer  Transfers assist     Chair/bed transfer assist level: Supervision/Verbal cueing     Locomotion Ambulation   Ambulation assist      Assist level: Supervision/Verbal cueing Assistive device: No Device Max distance: 500'   Walk 10 feet activity   Assist     Assist level: Supervision/Verbal cueing Assistive device: No Device   Walk 50 feet activity   Assist    Assist level: Supervision/Verbal cueing Assistive device: No Device    Walk 150 feet activity   Assist    Assist level: Supervision/Verbal cueing Assistive device: No Device    Walk 10 feet on uneven surface  activity   Assist     Assist level: Supervision/Verbal cueing Assistive device: Other (comment)(no AD)   Wheelchair     Assist Will patient use wheelchair at discharge?: No Type of Wheelchair: Manual Wheelchair activity did not occur: N/A         Wheelchair 50 feet with 2 turns activity    Assist    Wheelchair 50 feet with 2 turns activity did not occur: N/A       Wheelchair 150 feet activity     Assist  Wheelchair 150 feet activity did  not occur: N/A       Blood pressure 113/60, pulse 77, temperature 98 F (36.7 C), resp. rate 18, SpO2 95 %.    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 Status post loop recorder  Continue CIR 2. Antithrombotics: -DVT/anticoagulation:SCDs.Lower extremity Dopplers negative. -antiplatelet therapy: Aspirin 81 mg daily,  Brilinta 90 mg twice daily 3. Pain Management:Tylenol as needed 4. Mood:Provide emotional support -antipsychotic agents: N/A 5. Neuropsych: This patientiscapable of making decisions on hisown behalf. 6. Skin/Wound Care:Routine skin checks 7. Fluids/Electrolytes/Nutrition:Routine in and outs 8. Hypertension. On no antihypertensive medication prior to admission. Monitor with increased mobility  Controlled on 3/26 9. Hyperlipidemia. Lipitor 10. New findings of diabetes mellitus. Hemoglobin A1c 8.3. SSI. Diabetic teaching CBG (last 3)  Recent Labs    06/14/19 1630 06/14/19 2128 06/15/19 0618  GLUCAP 154* 127* 125*   Metformin 250 daily started 3/22  At the level on 3/26 11.  E. 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  Tobacco abuse  Nicotine patch 14. Leukocytosis  WBCs 10.9 on 3/22  Afebrile  Cont to monitor  LOS: 6 days A FACE TO FACE EVALUATION WAS PERFORMED  Seniyah Esker Lorie Phenix 06/15/2019, 10:20 AM

## 2019-06-15 NOTE — Progress Notes (Signed)
Occupational Therapy Session Note  Patient Details  Name: Scott Day MRN: [id number] Date of Birth: 1960-09-09  Today's Date: 06/15/2019 OT Individual Time: [id number] OT Individual Time Calculation (min): 30 min    Short Term Goals: Week 1:  OT Short Term Goal 1 (Week 1): STGs=LTGs due to ELOS  Skilled Therapeutic Interventions/Progress Updates:    1:1. Pt completes all mobility with MOD I into bathroom and voids standing with no AD. Pt completes functional transfer to outside courtyard with MOD I carrying 2 boxes. Pt completes alternating attention task with graded pipe tree and peg board activity. tp requires VC for alternating between tasks and mod VC for noticing errors. Pt able to fix errors on pegboard but unable on pipetree without total A. Exited session with tp seated in bed, exit alarm on and call light in reach  Therapy Documentation Precautions:  Precautions Precautions: Fall Precaution Comments: safety, impulsive. Restrictions Weight Bearing Restrictions: No General:   Vital Signs: Therapy Vitals Temp: 97.6 F (36.4 C) Pulse Rate: 90 Resp: 16 BP: 136/84 Patient Position (if appropriate): Lying Oxygen Therapy SpO2: 95 % O2 Device: Room Air Pain:   ADL: ADL Eating: Independent Grooming: Independent Where Assessed-Grooming: Standing at sink Upper Body Bathing: Independent Where Assessed-Upper Body Bathing: Shower Lower Body Bathing: Modified independent Where Assessed-Lower Body Bathing: Shower Upper Body Dressing: Independent Where Assessed-Upper Body Dressing: Edge of bed Lower Body Dressing: Independent Where Assessed-Lower Body Dressing: Edge of bed Toileting: Independent Where Assessed-Toileting: Glass blower/designer: Programmer, applications Method: Magazine features editor: Close supervision Social research officer, government Method: Heritage manager: Civil engineer, contracting with back [employer] of [employer]: Within  [employer] Alignment/Gaze Preference: Gaze right(pt can now shift his eyes to the Left but tends to keep his visual focus to his R) Visual Fields: (on testing, pt identifies objects in L visual field, functionally he demonstrates a field loss) Diplopia Assessment: (pt denies diplopia) Perception  Perception: Impaired Inattention/Neglect: Does not attend to left visual field(improved from admission, but still needs cues to look to his left to find things) Praxis Praxis: Intact Exercises:   Other Treatments:     Therapy/Group: Individual Therapy  [name] 06/15/2019, 3:54 PM

## 2019-06-15 NOTE — Progress Notes (Signed)
Physical Therapy Discharge Summary  Patient Details  Name: Scott Day MRN: [id number] Date of Birth: 11-28-1960  Today's Date: 06/15/2019 PT Individual Time: 6270-3500 PT Individual Time Calculation (min): 61 min    Patient has met 8 of 10 long term goals due to improved activity tolerance, improved balance, improved postural control, ability to compensate for deficits, functional use of  left upper extremity and left lower extremity, improved attention, improved awareness and improved coordination.  Patient to discharge at an ambulatory level Supervision.   Patient's care partner is independent to provide the necessary physical and cognitive assistance at discharge.  Reasons goals not met: Patient requires supervision for car transfers to ensure his safety due to L inattention and poor safety awareness. Pt did not perform floor transfer.  Recommendation:  Patient will benefit from ongoing skilled PT services in outpatient setting to continue to advance safe functional mobility, address ongoing impairments in dynamic standing balance, activity tolerance, L attention, safety awareness, and minimize fall risk.  Equipment: No equipment provided (none needed)  Reasons for discharge: treatment goals met and discharge from hospital  Patient/family agrees with progress made and goals achieved: Yes  Skilled Therapeutic Interventions/Progress Updates:  Pt received supine in bed perseverating on the fact that he is ready to go home today. Therapist educated pt on discharge plan with date set for 06/16/2019. With redirection, pt agreeable to participate in therapy session. Pt also perseverates on his desire to drive - therapist reinforced throughout session that MD has educated pt that it is not safe at this time. Therapist reinforced prior education regarding his L inattention/visual impairments. Supine<>sit independently without use of bed features. Sit<>stands independently throughout session.  Gait ~439f to ortho gym, no AD, with supervision and cuing for left attention. Simulated ambulatory car transfer, no AD, with supervision. Gait ~170fup/down ramp, ~108f2 over mulch, and on/off curb all without UE support with supervision for safety. Gait ~150f15f main therapy gym with focus on pt's recall of gym location requiring min cuing for L attention with supervision for safety. Ascended/descended 12 steps without using handrails with supervision and pt self-selecting reciprocal pattern. Patient participated in Functional Gait Assessment demonstrating improvement with score of 28/30 with pt continuing to demonstrate difficulty with tandem and backwards walking. During this task pt demonstrates ability to pick up and place objects on floor with supervision. Gait training >1000ft62fside with focus on pt recalling directions to get outside - pt requires supervision and mod cuing to recall navigation due to L inattention and impaired memory - repeated cuing throughout session for pt to visually scan left during navigation tasks. Ambulated up/down outside ramp and up ~10steps outside with intermittent use of handrail with supervision. Participated in sustained and L attention task of placing PEG board pieces in the specific locations based on a color coded map - pt able to sustain attention ~3minu93m prior to becoming distracted by items in environment - also demonstrated significantly increased difficulty locating items on left side of board (left lower quadrant even more difficult). Ambulated back to room with cuing for navigating environment by visually locating signs to help with direction. Pt left seated EOB with needs in reach and bed alarm on - NT present.   PT Discharge Precautions/Restrictions Precautions Precaution Comments: safety, impulsive. Pain Pain Assessment Pain Scale: 0-10 Pain Score: 0-No pain Perception  Perception Perception: Impaired Inattention/Neglect: Does not attend to  left visual field(improving but still requires cuing) Praxis Praxis: Intact  Cognition Overall Cognitive Status: Impaired/Different  from baseline Arousal/Alertness: Awake/alert Orientation Level: Oriented X4 Attention: Focused;Sustained Focused Attention: Impaired Sustained Attention: Impaired Memory: Impaired Problem Solving: Impaired Behaviors: Impulsive, poor safety awareness Safety/Judgment: Impaired Sensation Sensation Light Touch: Appears Intact Hot/Cold: Not tested Proprioception: Appears Intact Stereognosis: Not tested Coordination Gross Motor Movements are Fluid and Coordinated: Yes Motor  Motor Motor - Discharge Observations: WFL  Mobility Bed Mobility Bed Mobility: Supine to Sit;Sit to Supine Supine to Sit: Independent Sit to Supine: Independent Transfers Transfers: Sit to Stand;Stand to [employer] Transfers Sit to Stand: Independent Stand to Sit: Independent Stand Pivot Transfers: Independent Transfer (Assistive device): None Locomotion  Gait Ambulation: Yes Gait Assistance: Supervision/Verbal cueing Gait Distance (Feet): 1000 Feet Assistive device: None Gait Assistance Details: Verbal cues for precautions/safety Gait Gait: Yes Gait Pattern: Within Functional Limits Gait velocity: decreased High Level Ambulation High Level Ambulation: (participated in FGA) Stairs / Additional Locomotion Stairs: Yes Stairs Assistance: Supervision/Verbal cueing Stair Management Technique: No rails Number of Stairs: 12 Height of Stairs: 6 Ramp: Supervision/Verbal cueing Curb: Supervision/Verbal cueing Wheelchair Mobility Wheelchair Mobility: No  Trunk/Postural Assessment  Cervical Assessment Cervical Assessment: Within Functional Limits Thoracic Assessment Thoracic Assessment: Within Functional Limits Lumbar Assessment Lumbar Assessment: Within Functional Limits Postural Control Postural Control: Within Functional Limits  Balance Balance Balance  Assessed: Yes Standardized Balance Assessment Standardized Balance Assessment: Functional Gait Assessment Dynamic Sitting Balance Dynamic Sitting - Level of Assistance: 7: Independent Dynamic Standing Balance Dynamic Standing - Level of Assistance: 5: Stand by assistance Functional Gait  Assessment Gait assessed : Yes Gait Level Surface: Walks 20 ft in less than 5.5 sec, no assistive devices, good speed, no evidence for imbalance, normal gait pattern, deviates no more than 6 in outside of the 12 in walkway width. Change in Gait Speed: Able to smoothly change walking speed without loss of balance or gait deviation. Deviate no more than 6 in outside of the 12 in walkway width. Gait with Horizontal Head Turns: Performs head turns smoothly with no change in gait. Deviates no more than 6 in outside 12 in walkway width Gait with Vertical Head Turns: Performs head turns with no change in gait. Deviates no more than 6 in outside 12 in walkway width. Gait and Pivot Turn: Pivot turns safely within 3 sec and stops quickly with no loss of balance. Step Over Obstacle: Is able to step over 2 stacked shoe boxes taped together (9 in total height) without changing gait speed. No evidence of imbalance. Gait with Narrow Base of Support: Ambulates 7-9 steps. Gait with Eyes Closed: Walks 20 ft, no assistive devices, good speed, no evidence of imbalance, normal gait pattern, deviates no more than 6 in outside 12 in walkway width. Ambulates 20 ft in less than 7 sec. Ambulating Backwards: Walks 20 ft, uses assistive device, slower speed, mild gait deviations, deviates 6-10 in outside 12 in walkway width. Steps: Alternating feet, no rail. Total Score: 28 Extremity Assessment  RLE Assessment RLE Assessment: Within Functional Limits Active Range of Motion (AROM) Comments: WFL General Strength Comments: Grossly 5/5 assessed in supine LLE Assessment LLE Assessment: Within Functional Limits Active Range of Motion  (AROM) Comments: WFL General Strength Comments: Grossly 5/5 assessed in supine    Tawana Scale, PT, DPT 06/15/2019, 1:06 PM

## 2019-06-15 NOTE — Care Management (Signed)
   The overall goal for the admission was met for:   Discharge location:Home with daughter and son assisting prn Length of Stay: 7 days with discharge 06/16/19  Discharge activity level:Pt performs transfers and ambulation with supervision for safety and verbal cues. Ambulation >1000' throughout session. Occasional cues to attend to L side as pt demonstrates L inattention. Ambulates on level surfaces, up and down graded ramps, and over unlevel/unpaved ground.  Home/community participation:Limited participation  Services provided included: MD, RD, PT, OT, SLP, RN, CM, TR, Pharmacy, Neuropsych and SW  Financial Services: Other: Uninsured  Follow-up services arranged: Outpatient: PT, OT, SLP and Patient/Family has no preference for HH/DME agencies  Comments (or additional information): [place], [place], [address] [phone]  [facility]  [address] [place], [place] [address]  [phone]  Patient/Family verbalized understanding of follow-up arrangements: Yes  Individual responsible for coordination of the follow-up plan: Daughter [name]: [phone]  Appointment for PCP: [facility] July 04, 2019; Dr. Chapman Fitch  [facility] discount card given to patient for medications   Margarito Liner

## 2019-06-15 NOTE — Progress Notes (Signed)
Occupational Therapy Session Note  Patient Details  Name: Scott Day MRN: [id number] Date of Birth: 08/06/1960  Today's Date: 06/15/2019 OT Individual Time: 1100-1200 OT Individual Time Calculation (min): 60 min    Short Term Goals: No short term goals set  Skilled Therapeutic Interventions/Progress Updates:    Pt received sitting EOB stating he is ready for a shower! He talked about how much he wanted one, but just kept sitting on the bed despite cues that he could get stated. On the 3rd cue, therapist stated, "Joseth get up now and get started".  He did and then no longer needed any cues during the self care portion of the session.  Pt gathered clothing in room and ambulated into bathroom with S. Once in bathroom, completed shower and dressing independently. Used toilet independently. Pt gathered towels and put them in the laundry and picked up his dirty clothing to place with other clothing.  Coordination: 9 hole peg test L side 33 seconds, R side 28 seconds Numerous cues with starting the test as he kept trying to grab all the pegs at once.  Pt joking around and once I gave a more assertive command, he followed through and did the test properly.  L side visual field awareness: -clock draw test - drew the numbers on 70% of the R side of clock. Asked pt what was wrong with the drawing and he remarked that he missed the entire left side.  Worked on filling in the 12, 3, 6 and 9 first and then he could fill in the numbers but still with some spatial difficulty.  L ocular motor control: - with head still worked on visual tracking of target to L in superior, medial and inferior planes -saccades with slower movement to L  Attention: Pt given paper with alphabet. Asked to write down a name of a tool he uses at his job for each letter or a tool associated with the letter. Pt would do well with 1-2 letters but then get distracted. Frequent cues to have pt stay on task as he kept talking.   Left the room for a few minutes and pt was able to do more on his own.  Insight.: Continues to be very limited as he asks daily about alcohol and drug consumption, driving, etc.  Pt has been told no daily but he keeps asking demonstrating poor memory and awareness.  Pt sitting at EOB with alarm on waiting for lunch tray.   Therapy Documentation Precautions:  Precautions Precautions: Fall Precaution Comments: safety, impulsive. Restrictions Weight Bearing Restrictions: No   Pain: Pain Assessment Pain Scale: 0-10 Pain Score: 4  ADL: ADL Eating: Independent Grooming: Independent Where Assessed-Grooming: Standing at sink Upper Body Bathing: Independent Where Assessed-Upper Body Bathing: Shower Lower Body Bathing: Modified independent Where Assessed-Lower Body Bathing: Shower Upper Body Dressing: Independent Where Assessed-Upper Body Dressing: Edge of bed Lower Body Dressing: Modified independent Where Assessed-Lower Body Dressing: Edge of bed Toileting: Modified independent Where Assessed-Toileting: Glass blower/designer: Close supervision Armed forces technical officer Method: Magazine features editor: Close supervision Social research officer, government Method: Heritage manager: Shower seat with back    Therapy/Group: Individual Therapy  Phillips 06/15/2019, 10:29 AM

## 2019-06-16 LAB — GLUCOSE, CAPILLARY
Glucose-Capillary: 132 mg/dL — ABNORMAL HIGH (ref 70–99)
Glucose-Capillary: 138 mg/dL — ABNORMAL HIGH (ref 70–99)
Glucose-Capillary: 144 mg/dL — ABNORMAL HIGH (ref 70–99)

## 2019-06-16 NOTE — Progress Notes (Signed)
Pt refused bed alarm during shift due to wanting to be independent. Pt educated on risk of falls and to wear non-skid socks. Pt refused non-skid socks due to having Crocs to wear when ambulating.

## 2019-06-16 NOTE — Plan of Care (Signed)
  Problem: Consults Goal: RH STROKE PATIENT EDUCATION Description: See Patient Education module for education specifics  Outcome: Completed/Met Goal: Diabetes Guidelines if Diabetic/Glucose > 140 Description: If diabetic or lab glucose is > 140 mg/dl - Initiate Diabetes/Hyperglycemia Guidelines & Document Interventions  Outcome: Completed/Met   Problem: RH BOWEL ELIMINATION Goal: RH STG MANAGE BOWEL WITH ASSISTANCE Description: STG Manage Bowel with min Assistance. Outcome: Completed/Met Goal: RH STG MANAGE BOWEL W/MEDICATION W/ASSISTANCE Description: STG Manage Bowel with Medication with min Assistance. Outcome: Completed/Met   Problem: RH SKIN INTEGRITY Goal: RH STG MAINTAIN SKIN INTEGRITY WITH ASSISTANCE Description: STG Maintain Skin Integrity With min Assistance. Outcome: Completed/Met   Problem: RH SAFETY Goal: RH STG ADHERE TO SAFETY PRECAUTIONS W/ASSISTANCE/DEVICE Description: STG Adhere to Safety Precautions With supervision Assistance/Device. Outcome: Completed/Met   Problem: RH KNOWLEDGE DEFICIT Goal: RH STG INCREASE KNOWLEDGE OF DIABETES Description: Pt/family will be able to manage DM with diet control and medication regimen using handouts and booklets prior to DC with mod I assist Outcome: Completed/Met Goal: RH STG INCREASE KNOWLEDGE OF STROKE PROPHYLAXIS Description: Pt/family will be able to manage stroke prevention with diet control and medication regimen using handouts and booklets prior to DC with mod I assist  Outcome: Completed/Met   Problem: RH KNOWLEDGE DEFICIT Goal: RH STG INCREASE KNOWLEGDE OF HYPERLIPIDEMIA Description: Pt/family will be able to manage HLD with diet control and medication regimen using handouts and booklets prior to DC with mod I assist  Outcome: Completed/Met

## 2019-06-16 NOTE — Progress Notes (Signed)
[facility] PHYSICAL MEDICINE & REHABILITATION PROGRESS NOTE   Subjective/Complaints: Patient seen sitting up in his chair this morning dressed in a thigh and vest, stating that he is ready for discharge.  He is appreciative of his care.  ROS: Denies CP, SOB N/V/D  Objective:   No results found. No results for input(s): WBC, HGB, HCT, PLT in the last 72 hours. No results for input(s): NA, K, CL, CO2, GLUCOSE, BUN, CREATININE, CALCIUM in the last 72 hours.  Intake/Output Summary (Last 24 hours) at 06/16/2019 1530 Last data filed at 06/15/2019 1854 Gross per 24 hour  Intake 240 ml  Output --  Net 240 ml     Physical Exam: Vital Signs Blood pressure 128/82, pulse 82, temperature 97.9 F (36.6 C), resp. rate 18, SpO2 95 %. Constitutional: No distress . Vital signs reviewed. HENT: Normocephalic.  Atraumatic. Eyes: EOMI. No discharge. Cardiovascular: No JVD. Respiratory: Normal effort.  No stridor. GI: Non-distended. Skin: Warm and dry.  Intact. Psych: Slowed, some improvement. Musc: No edema in extremities.  No tenderness in extremities. Neurologic: Alert Motor:  LUE/LLE: 4+-5/5 proximal distal, improving  Assessment/Plan: 1. 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  Physiatrist is providing close team supervision and 24 hour management of active medical problems listed below.  Physiatrist and rehab team continue to assess barriers to discharge/monitor patient progress toward functional and medical goals  Care Tool:  Bathing    Body parts bathed by patient: Right arm, Left arm, Chest, Abdomen, Front perineal area, Buttocks, Right upper leg, Left upper leg, Right lower leg, Left lower leg, Face         Bathing assist Assist Level: Independent     Upper Body Dressing/Undressing Upper body dressing   What is the patient wearing?: Pull over shirt, Button  up shirt    Upper body assist Assist Level: Independent    Lower Body Dressing/Undressing Lower body dressing      What is the patient wearing?: Underwear/pull up, Pants     Lower body assist Assist for lower body dressing: Independent     Toileting Toileting    Toileting assist Assist for toileting: Independent     Transfers Chair/bed transfer  Transfers assist     Chair/bed transfer assist level: Independent     Locomotion Ambulation   Ambulation assist      Assist level: Supervision/Verbal cueing Assistive device: No Device Max distance: >1025ft   Walk 10 feet activity   Assist     Assist level: Independent Assistive device: No Device   Walk 50 feet activity   Assist    Assist level: Supervision/Verbal cueing Assistive device: No Device    Walk 150 feet activity   Assist    Assist level: Supervision/Verbal cueing Assistive device: No Device    Walk 10 feet on uneven surface  activity   Assist     Assist level: Supervision/Verbal cueing Assistive device: Other (comment)(No device)   Wheelchair     Assist Will patient use wheelchair at discharge?: No Type of Wheelchair: Manual Wheelchair activity did not occur: N/A         Wheelchair 50 feet with 2 turns activity    Assist    Wheelchair 50 feet with 2 turns activity did not occur: N/A       Wheelchair 150 feet activity     Assist  Wheelchair 150 feet activity did not occur:  N/A       Blood pressure 128/82, pulse 82, temperature 97.9 F (36.6 C), resp. rate 18, SpO2 95 %.    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 Status post loop recorder  DC today  Will see patient for transitional care management in 1-2 weeks post-discharge 2. Antithrombotics: -DVT/anticoagulation:SCDs.Lower extremity Dopplers  negative. -antiplatelet therapy: Aspirin 81 mg daily, Brilinta 90 mg twice daily 3. Pain Management:Tylenol as needed 4. Mood:Provide emotional support -antipsychotic agents: N/A 5. Neuropsych: This patientiscapable of making decisions on hisown behalf. 6. Skin/Wound Care:Routine skin checks 7. Fluids/Electrolytes/Nutrition:Routine in and outs 8. Hypertension. On no antihypertensive medication prior to admission. Monitor with increased mobility  Controlled on 3/27 9. Hyperlipidemia. Lipitor 10. New findings of diabetes mellitus. Hemoglobin A1c 8.3. SSI. Diabetic teaching CBG (last 3)  Recent Labs    06/15/19 2359 06/16/19 0439 06/16/19 0654  GLUCAP 132* 138* 144*   Metformin 250 daily started 3/22  Elevated this a.m., will require ambulatory monitoring with further adjustments 11.  E. 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  Tobacco abuse  Nicotine patch 14. Leukocytosis  WBCs 10.9 on 3/22  Afebrile  Cont to monitor  LOS: 7 days A FACE TO FACE EVALUATION WAS PERFORMED  Madilyne Tadlock Lorie Phenix 06/16/2019, 3:30 PM

## 2019-06-16 NOTE — Progress Notes (Signed)
Patient given discharge instructions by Linna Hoff PA. All questions were answered. Patient wheeled down via wheelchair with all personal belongings.

## 2019-06-20 ENCOUNTER — Telehealth: Payer: Self-pay

## 2019-06-20 NOTE — Telephone Encounter (Signed)
Called the daughter of the patient and left message of the appt, time and location. Also to call back to answer questions.

## 2019-06-21 ENCOUNTER — Encounter (HOSPITAL_COMMUNITY): Payer: Self-pay | Admitting: Physical Therapy

## 2019-06-21 ENCOUNTER — Ambulatory Visit (HOSPITAL_COMMUNITY): Payer: Medicaid Other | Admitting: Occupational Therapy

## 2019-06-21 ENCOUNTER — Encounter (HOSPITAL_COMMUNITY): Payer: Self-pay | Admitting: Occupational Therapy

## 2019-06-21 ENCOUNTER — Ambulatory Visit (HOSPITAL_COMMUNITY): Payer: Medicaid Other | Admitting: Speech Pathology

## 2019-06-21 ENCOUNTER — Other Ambulatory Visit: Payer: Self-pay

## 2019-06-21 ENCOUNTER — Ambulatory Visit (HOSPITAL_COMMUNITY): Payer: Medicaid Other | Attending: Physician Assistant | Admitting: Physical Therapy

## 2019-06-21 ENCOUNTER — Encounter (HOSPITAL_COMMUNITY): Payer: Self-pay | Admitting: Speech Pathology

## 2019-06-21 DIAGNOSIS — R2689 Other abnormalities of gait and mobility: Secondary | ICD-10-CM | POA: Diagnosis not present

## 2019-06-21 DIAGNOSIS — R41841 Cognitive communication deficit: Secondary | ICD-10-CM | POA: Diagnosis present

## 2019-06-21 DIAGNOSIS — R29898 Other symptoms and signs involving the musculoskeletal system: Secondary | ICD-10-CM | POA: Diagnosis present

## 2019-06-21 DIAGNOSIS — R262 Difficulty in walking, not elsewhere classified: Secondary | ICD-10-CM | POA: Insufficient documentation

## 2019-06-21 NOTE — Therapy (Signed)
[facility] [address] [place], [place], [address] Phone: [phone]   Fax:  [phone]  Occupational Therapy Evaluation  Patient Details  Name: Scott Day MRN: [id number] Date of Birth: December 29, 1960 Referring Provider (OT): Lauraine Rinne, PA-C   Encounter Date: 06/21/2019  OT End of Session - 06/21/19 0929    Visit Number  1    Number of Visits  1    Date for OT Re-Evaluation  06/22/19    Authorization Type  Self-pay    OT Start Time  0902    OT Stop Time  0924    OT Time Calculation (min)  22 min    Activity Tolerance  Patient tolerated treatment well    Behavior During Therapy  [facility] for tasks assessed/performed       Past Medical History:  Diagnosis Date  . Cancer [facility]) 2012   colon cancer  . Closed fracture of left distal radius   . GERD (gastroesophageal reflux disease)     Past Surgical History:  Procedure Laterality Date  . BUBBLE STUDY  06/08/2019   Procedure: BUBBLE STUDY;  Surgeon: Buford Dresser, MD;  Location: [facility] ENDOSCOPY;  Service: Cardiovascular;;  . COLON SURGERY  2012   colon cancer  . IR CT HEAD LTD  06/04/2019  . IR CT HEAD LTD  06/04/2019  . IR INTRA CRAN STENT  06/04/2019  . IR PERCUTANEOUS ART THROMBECTOMY/INFUSION INTRACRANIAL INC DIAG ANGIO  06/04/2019  . IR US GUIDE VASC ACCESS RIGHT  06/04/2019  . OPEN REDUCTION INTERNAL FIXATION (ORIF) DISTAL RADIAL FRACTURE Left 09/20/2017   Procedure: OPEN REDUCTION INTERNAL FIXATION (ORIF) DISTAL RADIAL FRACTURE;  Surgeon: Leanora Cover, MD;  Location: [place];  Service: Orthopedics;  Laterality: Left;  . RADIOLOGY WITH ANESTHESIA N/A 06/04/2019   Procedure: IR WITH ANESTHESIA;  Surgeon: Radiologist, Medication, MD;  Location: [place];  Service: Radiology;  Laterality: N/A;  . TEE WITHOUT CARDIOVERSION N/A 06/08/2019   Procedure: TRANSESOPHAGEAL ECHOCARDIOGRAM (TEE);  Surgeon: Buford Dresser, MD;  Location: [facility] ENDOSCOPY;  Service:  Cardiovascular;  Laterality: N/A;    There were no vitals filed for this visit.  Subjective Assessment - 06/21/19 0926    Subjective   S: I've been playing my guitar.    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 Pt attended CIR at [facility] with discharge on 06/16/19. Pt was referred to occupational therapy for evaluation and treatment by Lauraine Rinne, PA-C.    Patient Stated Goals  To be able to ride my motorcycle    Currently in Pain?  No/denies        [facility] OT Assessment - 06/21/19 0859      Assessment   Medical Diagnosis  s/p right CVA    Referring Provider (OT)  Lauraine Rinne, PA-C    Onset Date/Surgical Date  06/04/19    Hand Dominance  Right    Next MD Visit  unsure of date    Prior Therapy  CIR at [facility]      Precautions   Precautions  None      Restrictions   Weight Bearing Restrictions  No      Balance Screen   Has the patient fallen in the past 6 months  No    Has the patient had a decrease in activity level because of a fear of falling?   No    Is the patient reluctant to leave their  home because of a fear of falling?   No      Prior Function   Level of Independence  Independent    Vocation  Full time employment    Vocation Requirements  remodeling houses    Leisure  riding motorcycle, playing guitar      ADL   ADL comments  Pt is not having difficulty with any ADLs. Has mowed the lawn, feeding the chickens, playing guitar      Written Expression   Dominant Hand  Right      Cognition   Overall Cognitive Status  Within Functional Limits for tasks assessed      Coordination   9 Hole Peg Test  Right;Left    Right 9 Hole Peg Test  24.37"    Left 9 Hole Peg Test  25.29"      ROM / Strength   AROM / PROM / Strength  Strength      Strength   Overall Strength Comments  BUE strength 5/5 throughout      Hand Function   Right Hand Grip (lbs)  125    Right Hand Lateral Pinch  30 lbs    Right Hand 3 Point  Pinch  24 lbs    Left Hand Grip (lbs)  100    Left Hand Lateral Pinch  24 lbs    Left 3 point pinch  19 lbs                      OT Education - 06/21/19 0927    Education Details  educated on importance of compliance with medications, dietary and lifestyle adjustments to aid in stroke prevention    Person(s) Educated  Patient    Methods  Explanation;Demonstration;Handout    Comprehension  Verbalized understanding;Returned demonstration                 Plan - 06/21/19 0929    Clinical Impression Statement  A: Pt is a 59 y/o male s/p right CVA on 06/04/19 presenting for OT evaluation post discharge from CIR. Pt demonstrates LUE strength WNL, coordination and sensation are intact. Pt is completing all ADLs independently, feeding chickens, and playing guitar without difficulty. No further OT needs at this time. Is anxious to return to driving and riding his motorcycle.    OT Occupational Profile and History  Problem Focused Assessment - Including review of records relating to presenting problem    Occupational performance deficits (Please refer to evaluation for details):  IADL's    Clinical Decision Making  Limited treatment options, no task modification necessary    Comorbidities Affecting Occupational Performance:  None    Modification or Assistance to Complete Evaluation   No modification of tasks or assist necessary to complete eval    OT Frequency  One time visit    OT Treatment/Interventions  Patient/family education    Plan  P: No further OT needs at this time    Consulted and Agree with Plan of Care  Patient       Patient will benefit from skilled therapeutic intervention in order to improve the following deficits and impairments:           Visit Diagnosis: Other symptoms and signs involving the musculoskeletal system    Problem List Patient Active Problem List   Diagnosis Date Noted  . Labile blood glucose   . Leukocytosis   . Tobacco abuse    . Dysphagia, post-stroke   . E. coli UTI   .  Diabetes mellitus, new onset ([place])   . 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 Cerebrovascular accident (CVA) due to thrombosis of right middle cerebral artery ([place])   . Essential hypertension   . Oropharyngeal dysphagia   . Left hemiparesis ([place])   . Class 1 obesity due to excess calories with serious comorbidity and body mass index (BMI) of 32.0 to 32.9 in adult   . Acute ischemic cerebrovascular accident (CVA) involving right middle cerebral artery territory ([place]) 06/04/2019  . Arterial ischemic stroke, MCA, right, acute [facility]) 06/04/2019   Guadelupe Sabin, OTR/L  [phone] 06/21/2019, 9:31 AM  [facility] [address] [place], [place], [address] Phone: [phone]   Fax:  [phone]  Name: Scott Day MRN: [id number] Date of Birth: 1960/08/18

## 2019-06-21 NOTE — Therapy (Signed)
[facility] [address] [place], [place], [address] Phone: [phone]   Fax:  [phone]  Physical Therapy Evaluation  Patient Details  Name: Scott Day MRN: [id number] Date of Birth: 18-Nov-1960 Referring Provider (PT): Princella Ion    Encounter Date: 06/21/2019  PT End of Session - 06/21/19 0855    Visit Number  1    Number of Visits  8    Date for PT Re-Evaluation  07/20/19    Authorization Type  Self pay    Authorization Time Period  06/21/19- 07/20/19    Progress Note Due on Visit  8    PT Start Time  0815    PT Stop Time  0900    PT Time Calculation (min)  45 min    Equipment Utilized During Treatment  Gait belt    Activity Tolerance  Patient tolerated treatment well    Behavior During Therapy  [facility] for tasks assessed/performed       Past Medical History:  Diagnosis Date  . Cancer [facility]) 2012   colon cancer  . Closed fracture of left distal radius   . GERD (gastroesophageal reflux disease)     Past Surgical History:  Procedure Laterality Date  . BUBBLE STUDY  06/08/2019   Procedure: BUBBLE STUDY;  Surgeon: Buford Dresser, MD;  Location: [facility] ENDOSCOPY;  Service: Cardiovascular;;  . COLON SURGERY  2012   colon cancer  . IR CT HEAD LTD  06/04/2019  . IR CT HEAD LTD  06/04/2019  . IR INTRA CRAN STENT  06/04/2019  . IR PERCUTANEOUS ART THROMBECTOMY/INFUSION INTRACRANIAL INC DIAG ANGIO  06/04/2019  . IR US GUIDE VASC ACCESS RIGHT  06/04/2019  . OPEN REDUCTION INTERNAL FIXATION (ORIF) DISTAL RADIAL FRACTURE Left 09/20/2017   Procedure: OPEN REDUCTION INTERNAL FIXATION (ORIF) DISTAL RADIAL FRACTURE;  Surgeon: Leanora Cover, MD;  Location: [facility];  Service: Orthopedics;  Laterality: Left;  . RADIOLOGY WITH ANESTHESIA N/A 06/04/2019   Procedure: IR WITH ANESTHESIA;  Surgeon: Radiologist, Medication, MD;  Location: [facility];  Service: Radiology;  Laterality: N/A;  . TEE WITHOUT CARDIOVERSION N/A 06/08/2019    Procedure: TRANSESOPHAGEAL ECHOCARDIOGRAM (TEE);  Surgeon: Buford Dresser, MD;  Location: [facility] ENDOSCOPY;  Service: Cardiovascular;  Laterality: N/A;    There were no vitals filed for this visit.   Subjective Assessment - 06/21/19 0824    Subjective  Patient presents to physical therapy with complaint of LT side weakness s/p CVA on 06/04/19. Patient was admitted to hospital and stayed for 2 weeks. Says he underwent inpatient therapy while at [facility]. Was DC on 06/16/19. Patient says he has been doing well since leaving hospital and has been ambulating with no AD, but says he has been having some trouble with nausea due to some mediations he is on. Patient reports no issues with balance, no falls since leaving the hospital.    Limitations  Lifting;Standing;Walking;House hold activities    Patient Stated Goals  Get back to normal    Currently in Pain?  No/denies         [facility] PT Assessment - 06/21/19 0001      Assessment   Medical Diagnosis  CVA (RT side)     Referring Provider (PT)  Princella Ion     Onset Date/Surgical Date  06/04/19    Prior Therapy  Had inpatient therapy at [facility]       Precautions   Precautions  None      Restrictions  Weight Bearing Restrictions  No      Balance Screen   Has the patient fallen in the past 6 months  No      [place] residence    Living Arrangements  Alone   has girlfiend and children that visit often    Type of [place] to enter    Entrance Stairs-Number of Steps  2    Entrance Stairs-Rails  Right    [place]  One level      Prior Function   Level of Independence  Independent      Cognition   Overall Cognitive Status  Within Functional Limits for tasks assessed      Sensation   Light Touch  Appears Intact      ROM / Strength   AROM / PROM / Strength  Strength      Strength   Strength Assessment Site  Hip;Knee;Ankle    Right/Left Hip  Right;Left     Right Hip Flexion  5/5    Right Hip Extension  5/5    Right Hip ABduction  5/5    Left Hip Flexion  5/5    Left Hip Extension  5/5    Left Hip ABduction  5/5    Right/Left Knee  Right;Left    Right Knee Flexion  5/5    Right Knee Extension  5/5    Left Knee Flexion  5/5    Left Knee Extension  5/5    Right/Left Ankle  Right;Left    Right Ankle Dorsiflexion  5/5    Left Ankle Dorsiflexion  5/5      Ambulation/Gait   Ambulation/Gait  Yes    Ambulation/Gait Assistance  7: Independent    Assistive device  None    Gait Pattern  Within Functional Limits   except occasional LT deviation, mild    Ambulation Surface  Level;Indoor      Balance   Balance Assessed  Yes      Static Standing Balance   Static Standing Balance -  Activities   Single Leg Stance - Right Leg;Single Leg Stance - Left Leg    Static Standing - Comment/# of Minutes  10 sec, 10 sec min/mod sway       Standardized Balance Assessment   Standardized Balance Assessment  Dynamic Gait Index      Dynamic Gait Index   Level Surface  Normal    Change in Gait Speed  Mild Impairment    Gait with Horizontal Head Turns  Mild Impairment    Gait with Vertical Head Turns  Mild Impairment    Gait and Pivot Turn  Normal    Step Over Obstacle  Normal    Step Around Obstacles  Normal    Steps  Normal    Total Score  21      Functional Gait  Assessment   Gait assessed   --                Objective measurements completed on examination: See above findings.              PT Education - 06/21/19 0826    Education Details  on evaluation finidngs and POC    Person(s) Educated  Patient    Methods  Explanation    Comprehension  Verbalized understanding       PT Short Term Goals - 06/21/19 [id number]  PT SHORT TERM GOAL #1   Title  Patient will be independent with initial HEP to improve functional outcomes    Time  2    Period  Weeks    Status  New    Target Date  07/06/19        PT Long Term Goals  - 06/21/19 1056      PT LONG TERM GOAL #1   Title  Patient will reports no falls since starting therapy showing improved safety awareness and functional outcomes.    Time  4    Period  Weeks    Status  New    Target Date  07/20/19      PT LONG TERM GOAL #2   Title  Patient will feel confident with ambulation and ADLs in/around his home and on uneven/outdoor surfaces to indicate reduced risk of falls and improved funciton loutcomes.    Time  4    Period  Weeks    Status  New    Target Date  07/20/19      PT LONG TERM GOAL #3   Title  Patient will improve DGI by 3 points to indicate reduced falls risk and improved funcitoal outcomes.    Time  4    Period  Weeks    Status  New    Target Date  07/20/19             Plan - 06/21/19 0856    Clinical Impression Statement  Patient is a 59 y.o. male who presents to physical therapy with complaint of LT side weakness, balance difficulties s/p RT CVA on 06/04/19. Patient demonstrates mild balance deficits and slight gait deviations which are negatively impacting patient ability to perform ADLs and functional mobility tasks. Patient will benefit from skilled physical therapy services to address these deficits to improve level of function with ADLs, functional mobility tasks, and reduce risk for falls.    Examination-Activity Limitations  Locomotion Level;Lift    Examination-Participation Restrictions  Community Activity;Yard Work;Driving    Stability/Clinical Decision Making  Stable/Uncomplicated    Clinical Decision Making  Low    Rehab Potential  Good    PT Frequency  2x / week    PT Duration  4 weeks    PT Treatment/Interventions  ADLs/Self Care Home Management;Aquatic Therapy;Biofeedback;Cryotherapy;Electrical Stimulation;Iontophoresis 4mg /ml Dexamethasone;Moist Heat;Balance training;Therapeutic exercise;Manual techniques;Therapeutic activities;Functional mobility training;Stair training;Orthotic Fit/Training;Gait  training;Patient/family education;DME Instruction;Neuromuscular re-education;Ultrasound;Parrafin;Visual/perceptual remediation/compensation;Joint Manipulations;Passive range of motion;Dry needling;Energy conservation;Splinting;Taping;Vasopneumatic Device;Vestibular    PT Next Visit Plan  Review goals, Issue HEP. Progress ther ex with focus on funcitonal strength and challneges to dynamic balance    PT Home Exercise Plan  Issue at next visit    Consulted and Agree with Plan of Care  Patient       Patient will benefit from skilled therapeutic intervention in order to improve the following deficits and impairments:  Abnormal gait, Decreased coordination, Decreased balance, Decreased safety awareness  Visit Diagnosis: Other abnormalities of gait and mobility  Difficulty in walking, not elsewhere classified     Problem List Patient Active Problem List   Diagnosis Date Noted  . Labile blood glucose   . Leukocytosis   . Tobacco abuse   . Dysphagia, post-stroke   . E. coli UTI   . Diabetes mellitus, new onset ([place])   . 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 Cerebrovascular accident (CVA) due to thrombosis of right middle cerebral artery ([place])   . Essential hypertension   . Oropharyngeal  dysphagia   . Left hemiparesis ([place])   . Class 1 obesity due to excess calories with serious comorbidity and body mass index (BMI) of 32.0 to 32.9 in adult   . Acute ischemic cerebrovascular accident (CVA) involving right middle cerebral artery territory ([place]) 06/04/2019  . Arterial ischemic stroke, MCA, right, acute ([place]) 06/04/2019   11:28 AM, 06/21/19 Josue Hector PT DPT  Physical Therapist with [facility]  [phone] [place], [place], [address] Phone: [phone]   Fax:  [phone]  Name: Scott Day MRN: [id number] Date of Birth: 1960-05-25

## 2019-06-21 NOTE — Therapy (Signed)
[place], [place], [address] Phone: [phone]   Fax:  [phone]  Speech Language Pathology Evaluation  Patient Details  Name: Scott Day MRN: [id number] Date of Birth: 02-08-1961 Referring Provider (SLP): Lauraine Rinne, PA-C   Encounter Date: 06/21/2019  End of Session - 06/21/19 1432    Visit Number  1    Number of Visits  5    Date for SLP Re-Evaluation  07/26/19    Authorization Type  Medicaid pending    SLP Start Time  [id number]    SLP Stop Time   [id number]    SLP Time Calculation (min)  37 min    Activity Tolerance  Patient tolerated treatment well       Past Medical History:  Diagnosis Date  . Cancer [facility]) 2012   colon cancer  . Closed fracture of left distal radius   . GERD (gastroesophageal reflux disease)     Past Surgical History:  Procedure Laterality Date  . BUBBLE STUDY  06/08/2019   Procedure: BUBBLE STUDY;  Surgeon: Buford Dresser, MD;  Location: [facility] ENDOSCOPY;  Service: Cardiovascular;;  . COLON SURGERY  2012   colon cancer  . IR CT HEAD LTD  06/04/2019  . IR CT HEAD LTD  06/04/2019  . IR INTRA CRAN STENT  06/04/2019  . IR PERCUTANEOUS ART THROMBECTOMY/INFUSION INTRACRANIAL INC DIAG ANGIO  06/04/2019  . IR US GUIDE VASC ACCESS RIGHT  06/04/2019  . OPEN REDUCTION INTERNAL FIXATION (ORIF) DISTAL RADIAL FRACTURE Left 09/20/2017   Procedure: OPEN REDUCTION INTERNAL FIXATION (ORIF) DISTAL RADIAL FRACTURE;  Surgeon: Leanora Cover, MD;  Location: [place];  Service: Orthopedics;  Laterality: Left;  . RADIOLOGY WITH ANESTHESIA N/A 06/04/2019   Procedure: IR WITH ANESTHESIA;  Surgeon: Radiologist, Medication, MD;  Location: [employer];  Service: Radiology;  Laterality: N/A;  . TEE WITHOUT CARDIOVERSION N/A 06/08/2019   Procedure: TRANSESOPHAGEAL ECHOCARDIOGRAM (TEE);  Surgeon: Buford Dresser, MD;  Location[facility] ENDOSCOPY;  Service: Cardiovascular;  Laterality: N/A;    There  were no vitals filed for this visit.  Subjective Assessment - 06/21/19 1407    Subjective  "I feel like I'm doing pretty well."    Patient is accompained by:  Family member    Special Tests  SLUMS    Currently in Pain?  No/denies         SLP Evaluation [facility] - 06/21/19 1407      SLP Visit Information   SLP Received On  06/21/19    Referring Provider (SLP)  Lauraine Rinne, PA-C    Onset Date  06/04/2019    Medical Diagnosis  s/p R MCA CVA      General Information   HPI  BRACH PITZEN is a 59 y.o. right-handed male with history of colon cancer 2012, GERD, tobacco abuse.  Per chart review lives alone independent prior to admission.  Presented 06/04/2019 with left-sided weakness and slurred speech. Cranial CT scan negative for acute changes.  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  Patient did not receive TPA.  Underwent revascularization stent placement per interventional radiology.  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  He was admitted to inpatient rehab from 3/20-3/26/21 for PT, OT, and SLP and discharged home with supervision from family. He was referred for OP SLP evaluation and treatment.    Behavioral/Cognition  Alert and cooperative    Mobility  Status  ambulatory      Balance Screen   Has the patient fallen in the past 6 months  No    Has the patient had a decrease in activity level because of a fear of falling?   No    Is the patient reluctant to leave their home because of a fear of falling?   No      Prior Functional Status   Cognitive/Linguistic Baseline  Within functional limits    Type of Home  House     Lives With  Alone    Available Support  Family    Education  10th grade    Vocation  Self employed      Pain Assessment   Pain Assessment  No/denies pain      Cognition   Overall Cognitive Status  Impaired/Different from baseline    Area of Impairment   Attention    Current Attention Level  Sustained    Memory  Appears intact    Awareness  Impaired    Awareness Impairment  Anticipatory impairment    Problem Solving  Appears intact    Executive Function  Self Monitoring    Self Monitoring  Impaired    Self Monitoring Impairment  Verbal complex    Behaviors  Impulsive;Poor frustration tolerance   family reports reduced frustration tolerance     Auditory Comprehension   Overall Auditory Comprehension  Appears within functional limits for tasks assessed    Yes/No Questions  Within Functional Limits    Commands  Within Functional Limits    Conversation  Complex      Visual Recognition/Discrimination   Discrimination  Within Function Limits      Reading Comprehension   Reading Status  Within funtional limits      Expression   Primary Mode of Expression  Verbal      Verbal Expression   Overall Verbal Expression  Appears within functional limits for tasks assessed    Initiation  No impairment    Automatic Speech  Name;Social Response;Month of year    Level of Generative/Spontaneous Verbalization  Conversation    Repetition  No impairment    Naming  No impairment    Pragmatics  No impairment    Non-Verbal Means of Communication  Not applicable      Written Expression   Dominant Hand  Right    Written Expression  Not tested      Oral Motor/Sensory Function   Overall Oral Motor/Sensory Function  Appears within functional limits for tasks assessed      Motor Speech   Overall Motor Speech  Appears within functional limits for tasks assessed    Respiration  Within functional limits    Phonation  Normal    Resonance  Within functional limits    Articulation  Within functional limitis    Motor Planning  Witnin functional limits    Motor Speech Errors  Not applicable    Phonation  WFL      Standardized Assessments   Standardized Assessments   --   SLUMS 26/30         SLP Short Term Goals - 06/21/19 1434      SLP SHORT  TERM GOAL #1   Title  Pt will utilize the "stop and think" strategy during moderately complex planning tasks with indirect cues on 9 of 10 trials.    Baseline  Impulsivity noted with planning tasks and verbal cues to wait before attempting to complete.  Time  4    Period  Weeks    Status  New    Target Date  07/26/19      SLP SHORT TERM GOAL #2   Title  Pt will verbalize steps to complete moderately complex functional tasks involving 5+ steps with 90% acc with min assist.   Time  4    Period  Weeks    Status  New    Target Date  07/26/19      SLP SHORT TERM GOAL #3   Title  Pt will complete moderately complex planning task with 90% acc with min assist.    Time  4    Period  Weeks    Status  New    Target Date  07/26/19       SLP Long Term Goals - 06/21/19 1436      SLP LONG TERM GOAL #1   Title  Same as short       Plan - 06/21/19 1432    Clinical Impression Statement  Pt presents with mild cognitive linguistic changes characterized by impulsivity and mild inattention to the left negatively impacting planning and sequencing tasks. Pt's daughter also indicates that her father seems to have a "shorter fuse" now and is quick to become irritated. Mr. Islas is highly motivated to return to work and his normal activities which include: mowing the yard, fishing, feeding his chickens, remodeling homes, and riding his motorcycle. He is agreeable to short term SLP therapy to focus on improving safety and cognition in functional tasks. Pt will benefit from skilled SLP in order to address the above impairments, maximize independence, and decrease burden of care.    Speech Therapy Frequency  1x /week    Duration  4 weeks    Treatment/Interventions  Compensatory strategies;Patient/family education;SLP instruction and feedback;Internal/external aids;Compensatory techniques;Cognitive reorganization    Potential to Achieve Goals  Good    SLP Home Exercise Plan  Pt will complete HEP as assigned  to facilitate carryover of treatment strategies in home environment with written cues.    Consulted and Agree with Plan of Care  Patient       Patient will benefit from skilled therapeutic intervention in order to improve the following deficits and impairments:   Cognitive communication deficit    Problem List Patient Active Problem List   Diagnosis Date Noted  . Labile blood glucose   . Leukocytosis   . Tobacco abuse   . Dysphagia, post-stroke   . E. coli UTI   . Diabetes mellitus, new onset ([place])   . 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 Cerebrovascular accident (CVA) due to thrombosis of right middle cerebral artery ([place])   . Essential hypertension   . Oropharyngeal dysphagia   . Left hemiparesis ([place])   . Class 1 obesity due to excess calories with serious comorbidity and body mass index (BMI) of 32.0 to 32.9 in adult   . Acute ischemic cerebrovascular accident (CVA) involving right middle cerebral artery territory ([place]) 06/04/2019  . Arterial ischemic stroke, MCA, right, acute [facility]) 06/04/2019   Thank you,  Genene Churn, [place]  [facility] 06/21/2019, 2:37 PM  [place] [address] [place], [place], [address] Phone: [phone]   Fax:  [phone]  Name: Scott Day MRN: [id number] Date of Birth: [birth year]

## 2019-06-25 ENCOUNTER — Ambulatory Visit (HOSPITAL_COMMUNITY): Payer: Medicaid Other | Admitting: Physical Therapy

## 2019-06-26 ENCOUNTER — Other Ambulatory Visit: Payer: Self-pay

## 2019-06-26 ENCOUNTER — Encounter (HOSPITAL_COMMUNITY): Payer: Self-pay | Admitting: Physical Therapy

## 2019-06-26 ENCOUNTER — Encounter: Payer: Medicaid Other | Attending: Registered Nurse | Admitting: Registered Nurse

## 2019-06-26 ENCOUNTER — Encounter: Payer: Self-pay | Admitting: Registered Nurse

## 2019-06-26 VITALS — BP 128/83 | HR 83 | Temp 97.3°F | Ht 69.0 in | Wt 223.4 lb

## 2019-06-26 DIAGNOSIS — E119 Type 2 diabetes mellitus without complications: Secondary | ICD-10-CM | POA: Insufficient documentation

## 2019-06-26 DIAGNOSIS — G8194 Hemiplegia, unspecified affecting left nondominant side: Secondary | ICD-10-CM | POA: Insufficient documentation

## 2019-06-26 DIAGNOSIS — Z720 Tobacco use: Secondary | ICD-10-CM | POA: Diagnosis not present

## 2019-06-26 DIAGNOSIS — I63511 Cerebral infarction due to unspecified occlusion or stenosis of right middle cerebral artery: Secondary | ICD-10-CM | POA: Insufficient documentation

## 2019-06-26 NOTE — Progress Notes (Signed)
Subjective:    Patient ID: Scott Day, male    DOB: 10-May-1960, 59 y.o.   MRN: [id number]  HPI: Scott Day is a 59 y.o. male who is here for transitional care visit for follow up on his right middle-cerebral artery stroke, left hemiparesis, new onset DM and tobacco abuse.   Mr. Suniga presented to [facility] regional  ED with left- sided weakness on 06/04/2019, neurology was consulted and he was transferred to [facility] for emergent thrombectomy.    CT Head WO Contrast: CT Angio Head W or WO Contrast IMPRESSION: No acute intracranial hemorrhage or evidence of acute infarction. ASPECT score is 10.  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 Perfusion imaging demonstrates 25 mL core infarction and 126 mL territory at risk in the right MCA territory.  No hemodynamically significant stenosis in the neck.  He underwent Revascularization stent placement per intervention Radiology.   He was admitted to inpatient rehabilitation on 06/09/2019 and discharged home on 06/16/2019. He is receiving outpatient therapy at [facility]  He states he has pain in his bilateral arms. He rated his pain 0. Also reports he has a good appetite.    Pain Inventory Average Pain 5 Pain Right Now 0 My pain is dull and aching  In the last 24 hours, has pain interfered with the following? General activity 3 Relation with others 1 Enjoyment of life 3 What TIME of day is your pain at its worst? evening Sleep (in general) Good  Pain is worse with: unsure Pain improves with: medication Relief from Meds: 3  Mobility walk without assistance how many minutes can you walk? 60 ability to climb steps?  yes do you drive?  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   no  Physicians involved in your care Any changes since last visit?  no   No family history on file. Social History   Socioeconomic History  . Marital status: Married    Spouse name: Not on file  . Number of children: Not on file  . Years of education: Not on file  . Highest education level: Not on file  Occupational History  . Not on file  Tobacco Use  . Smoking status: Former Research scientist (life sciences)  . Smokeless tobacco: Never Used  Substance and Sexual Activity  . Alcohol use: Yes    Alcohol/week: 24.0 standard drinks    Types: 24 Cans of beer per week    Comment: 12 to 24 beers daily  . Drug use: Never  . Sexual activity: Not on file  Other Topics Concern  . Not on file  Social History Narrative  . Not on file   Social Determinants of Health   Financial Resource Strain:   . Difficulty of Paying Living Expenses:   Food Insecurity:   . Worried About Charity fundraiser in the Last Year:   . Arboriculturist in the Last Year:   Transportation Needs:   . Film/video editor (Medical):   Marland Kitchen Lack of Transportation (Non-Medical):   Physical Activity:   . Days of Exercise per Week:   . Minutes of Exercise per Session:   Stress:   . Feeling of Stress :   Social Connections:   . Frequency of Communication with Friends and Family:   .  Frequency of Social Gatherings with Friends and Family:   . Attends Religious Services:   . Active Member of Clubs or Organizations:   . Attends Archivist Meetings:   Marland Kitchen Marital Status:    Past Surgical History:  Procedure Laterality Date  . BUBBLE STUDY  06/08/2019   Procedure: BUBBLE STUDY;  Surgeon: Buford Dresser, MD;  Location: [facility] ENDOSCOPY;  Service: Cardiovascular;;  . COLON SURGERY  2012   colon cancer  . IR CT HEAD LTD  06/04/2019  . IR CT HEAD LTD  06/04/2019  . IR INTRA CRAN STENT  06/04/2019  . IR PERCUTANEOUS ART THROMBECTOMY/INFUSION INTRACRANIAL INC DIAG ANGIO  06/04/2019  . IR US GUIDE VASC ACCESS RIGHT  06/04/2019   . OPEN REDUCTION INTERNAL FIXATION (ORIF) DISTAL RADIAL FRACTURE Left 09/20/2017   Procedure: OPEN REDUCTION INTERNAL FIXATION (ORIF) DISTAL RADIAL FRACTURE;  Surgeon: Leanora Cover, MD;  Location: [place];  Service: Orthopedics;  Laterality: Left;  . RADIOLOGY WITH ANESTHESIA N/A 06/04/2019   Procedure: IR WITH ANESTHESIA;  Surgeon: Radiologist, Medication, MD;  Location: [place];  Service: Radiology;  Laterality: N/A;  . TEE WITHOUT CARDIOVERSION N/A 06/08/2019   Procedure: TRANSESOPHAGEAL ECHOCARDIOGRAM (TEE);  Surgeon: Buford Dresser, MD;  Location: [facility] ENDOSCOPY;  Service: Cardiovascular;  Laterality: N/A;   Past Medical History:  Diagnosis Date  . Cancer [facility]) 2012   colon cancer  . Closed fracture of left distal radius   . GERD (gastroesophageal reflux disease)    BP 128/83   Pulse 83   Temp (!) 97.3 F (36.3 C)   Ht 5\' 9"  (1.753 m)   Wt 223 lb 6.4 oz (101.3 kg)   SpO2 95%   BMI 32.99 kg/m   Opioid Risk Score:   Fall Risk Score:  `1  Depression screen PHQ 2/9  Depression screen PHQ 2/9 06/26/2019  Decreased Interest 0  Down, Depressed, Hopeless 0  PHQ - 2 Score 0  Altered sleeping 0  Tired, decreased energy 0  Change in appetite 0  Feeling bad or failure about yourself  0  Trouble concentrating 3  Moving slowly or fidgety/restless 1  Suicidal thoughts 0  PHQ-9 Score 4  Difficult doing work/chores Not difficult at all    Review of Systems  Constitutional: Negative.   HENT: Negative.   Eyes: Negative.   Respiratory: Negative.   Cardiovascular: Negative.   Gastrointestinal: Negative.   Endocrine:       High blood sugars  Genitourinary: Negative.   Musculoskeletal: Negative.   Skin: Negative.   Allergic/Immunologic: Negative.   Neurological: Negative.   Hematological: Bruises/bleeds easily.       [place]  Psychiatric/Behavioral: Positive for confusion.  All other systems reviewed and are negative.      Objective:   Physical  Exam Vitals and nursing note reviewed.  Constitutional:      Appearance: Normal appearance.  Cardiovascular:     Rate and Rhythm: Normal rate and regular rhythm.     Pulses: Normal pulses.     Heart sounds: Normal heart sounds.  Pulmonary:     Effort: Pulmonary effort is normal.     Breath sounds: Normal breath sounds.  Musculoskeletal:     Cervical back: Normal range of motion and neck supple.     Comments: Normal Muscle Bulk and Muscle Testing Reveals:  Upper Extremities: Full ROM and Muscle Strength on Right 5/5 and Left 4/5 Lower Extremities: Full ROM and Muscle Strength 5/5 Arises from Table with ease Narrow Based  Gait   Skin:    General: Skin is warm and dry.  Neurological:     Mental Status: He is alert and oriented to person, place, and time.  Psychiatric:        Mood and Affect: Mood normal.        Behavior: Behavior normal.           Assessment & Plan:   1.Right middle-cerebral artery stroke: left hemiparesis: Continue Outpatient Therapy: He has a scheduled HFU appointment with [facility].  2.New onset DM: He has a scheduled HFU appointment with [facility]. Continue to Monitor.  3. Tobacco Abuse: Educated on Smoking Cessation: Continue to Monitor.   15  minutes of face to face patient care time was spent during this visit. All questions were encouraged and answered.  F/U with Dr Posey Pronto in 4- 6 weeks

## 2019-06-27 ENCOUNTER — Encounter (HOSPITAL_COMMUNITY): Payer: Self-pay | Admitting: Physical Therapy

## 2019-06-27 ENCOUNTER — Ambulatory Visit (HOSPITAL_COMMUNITY): Payer: Medicaid Other | Admitting: Physical Therapy

## 2019-06-27 ENCOUNTER — Ambulatory Visit: Payer: Self-pay | Admitting: Physician Assistant

## 2019-06-27 DIAGNOSIS — R2689 Other abnormalities of gait and mobility: Secondary | ICD-10-CM | POA: Diagnosis not present

## 2019-06-27 DIAGNOSIS — R262 Difficulty in walking, not elsewhere classified: Secondary | ICD-10-CM

## 2019-06-27 NOTE — Therapy (Signed)
[facility] [address] [place], [place], [address] Phone: [phone]   Fax:  [phone]  Physical Therapy Treatment  Patient Details  Name: Scott Day MRN: [id number] Date of Birth: 24-Dec-1960 Referring Provider (PT): Princella Ion    Encounter Date: 06/27/2019  PT End of Session - 06/27/19 0823    Visit Number  2    Number of Visits  8    Date for PT Re-Evaluation  07/20/19    Authorization Type  Self pay    Authorization Time Period  06/21/19- 07/20/19    Progress Note Due on Visit  8    PT Start Time  0815    PT Stop Time  0855    PT Time Calculation (min)  40 min    Equipment Utilized During Treatment  Gait belt    Activity Tolerance  Patient tolerated treatment well    Behavior During Therapy  [facility] for tasks assessed/performed       Past Medical History:  Diagnosis Date  . Cancer [facility]) 2012   colon cancer  . Closed fracture of left distal radius   . GERD (gastroesophageal reflux disease)     Past Surgical History:  Procedure Laterality Date  . BUBBLE STUDY  06/08/2019   Procedure: BUBBLE STUDY;  Surgeon: Buford Dresser, MD;  Location: [facility] ENDOSCOPY;  Service: Cardiovascular;;  . COLON SURGERY  2012   colon cancer  . IR CT HEAD LTD  06/04/2019  . IR CT HEAD LTD  06/04/2019  . IR INTRA CRAN STENT  06/04/2019  . IR PERCUTANEOUS ART THROMBECTOMY/INFUSION INTRACRANIAL INC DIAG ANGIO  06/04/2019  . IR US GUIDE VASC ACCESS RIGHT  06/04/2019  . OPEN REDUCTION INTERNAL FIXATION (ORIF) DISTAL RADIAL FRACTURE Left 09/20/2017   Procedure: OPEN REDUCTION INTERNAL FIXATION (ORIF) DISTAL RADIAL FRACTURE;  Surgeon: Leanora Cover, MD;  Location: [place];  Service: Orthopedics;  Laterality: Left;  . RADIOLOGY WITH ANESTHESIA N/A 06/04/2019   Procedure: IR WITH ANESTHESIA;  Surgeon: Radiologist, Medication, MD;  Location: [place];  Service: Radiology;  Laterality: N/A;  . TEE WITHOUT CARDIOVERSION N/A 06/08/2019   Procedure: TRANSESOPHAGEAL ECHOCARDIOGRAM (TEE);  Surgeon: Buford Dresser, MD;  Location: [facility] ENDOSCOPY;  Service: Cardiovascular;  Laterality: N/A;    There were no vitals filed for this visit.  Subjective Assessment - 06/27/19 [id number]    Subjective  Patient says he is doing well and reports no new issues since last visit, no falls. Says his only complaint right now is that his arm hurts when he takes his medicine, mentions Lipitor    Limitations  Lifting;Standing;Walking;House hold activities    Patient Stated Goals  Get back to normal                            Balance Exercises - 06/27/19 0839      Balance Exercises: Standing   Tandem Stance  Eyes open;Foam/compliant surface;4 reps;20 secs   2 reps solid floor/ 2 reps on foam    Rockerboard  Anterior/posterior;Lateral;10 reps    Step Ups  Forward;6 inch   with opposite LT march and hold for 3"   Gait with Head Turns  Forward;3 reps    Tandem Gait  Forward;3 reps    Sidestepping  3 reps    Turning  3 reps   pivot turns at ends of blue line    Sit to Stand  Standard surface   10  reps   Other Standing Exercises  gait with head nods up/down 3 RT           PT Short Term Goals - 06/21/19 0859      PT SHORT TERM GOAL #1   Title  Patient will be independent with initial HEP to improve functional outcomes    Time  2    Period  Weeks    Status  New    Target Date  07/06/19        PT Long Term Goals - 06/21/19 1056      PT LONG TERM GOAL #1   Title  Patient will reports no falls since starting therapy showing improved safety awareness and functional outcomes.    Time  4    Period  Weeks    Status  New    Target Date  07/20/19      PT LONG TERM GOAL #2   Title  Patient will feel confident with ambulation and ADLs in/around his home and on uneven/outdoor surfaces to indicate reduced risk of falls and improved funciton loutcomes.    Time  4    Period  Weeks    Status  New    Target Date   07/20/19      PT LONG TERM GOAL #3   Title  Patient will improve DGI by 3 points to indicate reduced falls risk and improved funcitoal outcomes.    Time  4    Period  Weeks    Status  New    Target Date  07/20/19            Plan - 06/27/19 0859    Clinical Impression Statement  Patient tolerated session very well today. Initiated program, reviewed therapy goals. Educated patient on and issued EHP for balance. Was able to progress dynamic balance today, patient did very well but did have slight imbalance/ deviation toward LT side with gait with head turns, and lateral rocker board. Patient was able to improve with practice, and had no LOB during session but did require intermittent CG for safety. Will continue to progress dynamic balance as tolerated.    Examination-Activity Limitations  Locomotion Level;Lift    Examination-Participation Restrictions  Community Activity;Yard Work;Driving    Stability/Clinical Decision Making  Stable/Uncomplicated    Rehab Potential  Good    PT Frequency  2x / week    PT Duration  4 weeks    PT Treatment/Interventions  ADLs/Self Care Home Management;Aquatic Therapy;Biofeedback;Cryotherapy;Electrical Stimulation;Iontophoresis 4mg /ml Dexamethasone;Moist Heat;Balance training;Therapeutic exercise;Manual techniques;Therapeutic activities;Functional mobility training;Stair training;Orthotic Fit/Training;Gait training;Patient/family education;DME Instruction;Neuromuscular re-education;Ultrasound;Parrafin;Visual/perceptual remediation/compensation;Joint Manipulations;Passive range of motion;Dry needling;Energy conservation;Splinting;Taping;Vasopneumatic Device;Vestibular    PT Next Visit Plan  Progress ther ex with focus on funcitonal strength and challneges to dynamic balance. Add retro walking and obstacles next session    PT Home Exercise Plan  06/27/19: sit to stand, tandem stance, SLS    Consulted and Agree with Plan of Care  Patient       Patient will  benefit from skilled therapeutic intervention in order to improve the following deficits and impairments:  Abnormal gait, Decreased coordination, Decreased balance, Decreased safety awareness  Visit Diagnosis: Other abnormalities of gait and mobility  Difficulty in walking, not elsewhere classified     Problem List Patient Active Problem List   Diagnosis Date Noted  . Labile blood glucose   . Leukocytosis   . Tobacco abuse   . Dysphagia, post-stroke   . E. coli UTI   . Diabetes mellitus,  new onset ([place])   . 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 Cerebrovascular accident (CVA) due to thrombosis of right middle cerebral artery ([place])   . Essential hypertension   . Oropharyngeal dysphagia   . Left hemiparesis ([place])   . Class 1 obesity due to excess calories with serious comorbidity and body mass index (BMI) of 32.0 to 32.9 in adult   . Acute ischemic cerebrovascular accident (CVA) involving right middle cerebral artery territory ([place]) 06/04/2019  . Arterial ischemic stroke, MCA, right, acute ([place]) 06/04/2019    9:01 AM, 06/27/19 Josue Hector PT DPT  Physical Therapist with [facility]  ([phone]) [phone] [place], [place], [address] Phone: [phone]   Fax:  [phone]  Name: Scott Day MRN: [id number] Date of Birth: 06/01/1960

## 2019-06-27 NOTE — Patient Instructions (Signed)
Access Code: [facility] URL: [email or web address] Date: 06/27/2019 Prepared by: Josue Hector  Exercises Sit to Stand - 2 x daily - 7 x weekly - 2 sets - 10 reps Standing Tandem Balance with Counter Support - 2 x daily - 7 x weekly - 1 sets - 3 reps - 20 hold Standing Single Leg Stance with Counter Support - 2 x daily - 7 x weekly - 1 sets - 3 reps - 20 hold

## 2019-06-28 ENCOUNTER — Ambulatory Visit (HOSPITAL_COMMUNITY): Payer: Medicaid Other | Admitting: Physical Therapy

## 2019-06-28 ENCOUNTER — Other Ambulatory Visit: Payer: Self-pay

## 2019-06-28 ENCOUNTER — Encounter: Payer: Self-pay | Admitting: Registered Nurse

## 2019-06-28 ENCOUNTER — Encounter (HOSPITAL_COMMUNITY): Payer: Self-pay | Admitting: Physical Therapy

## 2019-06-28 DIAGNOSIS — R262 Difficulty in walking, not elsewhere classified: Secondary | ICD-10-CM

## 2019-06-28 DIAGNOSIS — R2689 Other abnormalities of gait and mobility: Secondary | ICD-10-CM

## 2019-06-28 NOTE — Therapy (Signed)
[facility] [address] [place], [place], [address] Phone: [phone]   Fax:  [phone]  Physical Therapy Treatment  Patient Details  Name: Scott Day MRN: [id number] Date of Birth: Aug 28, 1960 Referring Provider (PT): Princella Ion    Encounter Date: 06/28/2019  PT End of Session - 06/28/19 0821    Visit Number  3    Number of Visits  8    Date for PT Re-Evaluation  07/20/19    Authorization Type  Self pay    Authorization Time Period  06/21/19- 07/20/19    Progress Note Due on Visit  8    PT Start Time  0820    PT Stop Time  0858    PT Time Calculation (min)  38 min    Equipment Utilized During Treatment  Gait belt    Activity Tolerance  Patient tolerated treatment well    Behavior During Therapy  [facility] for tasks assessed/performed       Past Medical History:  Diagnosis Date  . Cancer [facility]) 2012   colon cancer  . Closed fracture of left distal radius   . GERD (gastroesophageal reflux disease)     Past Surgical History:  Procedure Laterality Date  . BUBBLE STUDY  06/08/2019   Procedure: BUBBLE STUDY;  Surgeon: Buford Dresser, MD;  Location: [facility] ENDOSCOPY;  Service: Cardiovascular;;  . COLON SURGERY  2012   colon cancer  . IR CT HEAD LTD  06/04/2019  . IR CT HEAD LTD  06/04/2019  . IR INTRA CRAN STENT  06/04/2019  . IR PERCUTANEOUS ART THROMBECTOMY/INFUSION INTRACRANIAL INC DIAG ANGIO  06/04/2019  . IR US GUIDE VASC ACCESS RIGHT  06/04/2019  . OPEN REDUCTION INTERNAL FIXATION (ORIF) DISTAL RADIAL FRACTURE Left 09/20/2017   Procedure: OPEN REDUCTION INTERNAL FIXATION (ORIF) DISTAL RADIAL FRACTURE;  Surgeon: Leanora Cover, MD;  Location: [place];  Service: Orthopedics;  Laterality: Left;  . RADIOLOGY WITH ANESTHESIA N/A 06/04/2019   Procedure: IR WITH ANESTHESIA;  Surgeon: Radiologist, Medication, MD;  Location: [place];  Service: Radiology;  Laterality: N/A;  . TEE WITHOUT CARDIOVERSION N/A 06/08/2019    Procedure: TRANSESOPHAGEAL ECHOCARDIOGRAM (TEE);  Surgeon: Buford Dresser, MD;  Location: [facility] ENDOSCOPY;  Service: Cardiovascular;  Laterality: N/A;    There were no vitals filed for this visit.  Subjective Assessment - 06/28/19 0821    Subjective  Patient reports no new issues since yesterday. Reports compliance with HEP.    Limitations  Lifting;Standing;Walking;House hold activities    Patient Stated Goals  Get back to normal    Currently in Pain?  No/denies                            Balance Exercises - 06/28/19 0836      Balance Exercises: Standing   Tandem Stance  Eyes open;Foam/compliant surface;2 reps;25 secs    SLS with Vectors  Foam/compliant surface;3 reps;10 secs    Rockerboard  Anterior/posterior;Lateral;10 reps    Step Ups  Forward   8 inch box   Gait with Head Turns  Forward;3 reps    Tandem Gait  Forward;2 reps    Retro Gait  3 reps    Sidestepping  3 reps    Turning  3 reps    Step Over Hurdles / Cones  stepping over 4 inch hurdles  15' 3RT fwd, 3RT lateral     Other Standing Exercises  gait with head nods  up/down 3 RT; stair amb 7 in 5 RT reciprocal with no rail     Other Standing Exercises Comments  BOSU step ups, fwd x10 each           PT Short Term Goals - 06/21/19 0859      PT SHORT TERM GOAL #1   Title  Patient will be independent with initial HEP to improve functional outcomes    Time  2    Period  Weeks    Status  New    Target Date  07/06/19        PT Long Term Goals - 06/21/19 1056      PT LONG TERM GOAL #1   Title  Patient will reports no falls since starting therapy showing improved safety awareness and functional outcomes.    Time  4    Period  Weeks    Status  New    Target Date  07/20/19      PT LONG TERM GOAL #2   Title  Patient will feel confident with ambulation and ADLs in/around his home and on uneven/outdoor surfaces to indicate reduced risk of falls and improved funciton loutcomes.    Time  4     Period  Weeks    Status  New    Target Date  07/20/19      PT LONG TERM GOAL #3   Title  Patient will improve DGI by 3 points to indicate reduced falls risk and improved funcitoal outcomes.    Time  4    Period  Weeks    Status  New    Target Date  07/20/19            Plan - 06/28/19 0902    Clinical Impression Statement  Patient tolerated session well today. He shows very good tolerance to balance activity, and has progressed well with dynamic balance activity. Patient was challenged with added BOSU step ups, and had min/ mod sway but shows appropriated corrective strategy. No LOB with dynamic gait training. Patient does show some difficulty with following commands during balance training, as he will often switch legs for step ups, and switch to incorrect instructed position with SLS vectors. Will continue to progress dynamic balance as tolerated and begin to incorporate cognitive factor to activity.    Examination-Activity Limitations  Locomotion Level;Lift    Examination-Participation Restrictions  Community Activity;Yard Work;Driving    Stability/Clinical Decision Making  Stable/Uncomplicated    Rehab Potential  Good    PT Frequency  2x / week    PT Duration  4 weeks    PT Treatment/Interventions  ADLs/Self Care Home Management;Aquatic Therapy;Biofeedback;Cryotherapy;Electrical Stimulation;Iontophoresis 4mg /ml Dexamethasone;Moist Heat;Balance training;Therapeutic exercise;Manual techniques;Therapeutic activities;Functional mobility training;Stair training;Orthotic Fit/Training;Gait training;Patient/family education;DME Instruction;Neuromuscular re-education;Ultrasound;Parrafin;Visual/perceptual remediation/compensation;Joint Manipulations;Passive range of motion;Dry needling;Energy conservation;Splinting;Taping;Vasopneumatic Device;Vestibular    PT Next Visit Plan  Progress ther ex with focus on funcitonal strength and challneges to dynamic balance. Add tandem gait and sidesteeping  on balance beam. Add congitive factor to balance (name colors while gait training)    PT Home Exercise Plan  06/27/19: sit to stand, tandem stance, SLS    Consulted and Agree with Plan of Care  Patient       Patient will benefit from skilled therapeutic intervention in order to improve the following deficits and impairments:  Abnormal gait, Decreased coordination, Decreased balance, Decreased safety awareness  Visit Diagnosis: Other abnormalities of gait and mobility  Difficulty in walking, not elsewhere classified     Problem List  Patient Active Problem List   Diagnosis Date Noted  . Labile blood glucose   . Leukocytosis   . Tobacco abuse   . Dysphagia, post-stroke   . E. coli UTI   . Diabetes mellitus, new onset ([place])   . 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 Cerebrovascular accident (CVA) due to thrombosis of right middle cerebral artery ([place])   . Essential hypertension   . Oropharyngeal dysphagia   . Left hemiparesis ([place])   . Class 1 obesity due to excess calories with serious comorbidity and body mass index (BMI) of 32.0 to 32.9 in adult   . Acute ischemic cerebrovascular accident (CVA) involving right middle cerebral artery territory ([place]) 06/04/2019  . Arterial ischemic stroke, MCA, right, acute ([place]) 06/04/2019   9:04 AM, 06/28/19 Josue Hector PT DPT  Physical Therapist with [facility]  [phone] [place], [place], [address] Phone: [phone]   Fax:  [phone]  Name: Scott Day MRN: [id number] Date of Birth: 1960-10-08

## 2019-07-02 ENCOUNTER — Encounter (HOSPITAL_COMMUNITY): Payer: Self-pay | Admitting: Speech Pathology

## 2019-07-02 ENCOUNTER — Ambulatory Visit (HOSPITAL_COMMUNITY): Payer: Medicaid Other | Admitting: Speech Pathology

## 2019-07-02 ENCOUNTER — Encounter (HOSPITAL_COMMUNITY): Payer: Self-pay | Admitting: Physical Therapy

## 2019-07-02 ENCOUNTER — Ambulatory Visit: Payer: Self-pay | Admitting: Family Medicine

## 2019-07-02 ENCOUNTER — Other Ambulatory Visit: Payer: Self-pay

## 2019-07-02 DIAGNOSIS — R41841 Cognitive communication deficit: Secondary | ICD-10-CM

## 2019-07-02 DIAGNOSIS — R2689 Other abnormalities of gait and mobility: Secondary | ICD-10-CM | POA: Diagnosis not present

## 2019-07-02 NOTE — Therapy (Signed)
[place], [place], [address] Phone: [phone]   Fax:  [phone]  Speech Language Pathology Treatment  Patient Details  Name: Scott Day MRN: [id number] Date of Birth: [birth year] Referring Provider (SLP): Lauraine Rinne, PA-C   Encounter Date: 07/02/2019  End of Session - 07/02/19 1225    Visit Number  2    Number of Visits  5    Date for SLP Re-Evaluation  07/26/19    Authorization Type  Medicaid pending    SLP Start Time  [id number]    SLP Stop Time   1228    SLP Time Calculation (min)  52 min    Activity Tolerance  Patient tolerated treatment well       Past Medical History:  Diagnosis Date  . Cancer [facility]) 2012   colon cancer  . Closed fracture of left distal radius   . GERD (gastroesophageal reflux disease)     Past Surgical History:  Procedure Laterality Date  . BUBBLE STUDY  06/08/2019   Procedure: BUBBLE STUDY;  Surgeon: Buford Dresser, MD;  Location: [facility] ENDOSCOPY;  Service: Cardiovascular;;  . COLON SURGERY  2012   colon cancer  . IR CT HEAD LTD  06/04/2019  . IR CT HEAD LTD  06/04/2019  . IR INTRA CRAN STENT  06/04/2019  . IR PERCUTANEOUS ART THROMBECTOMY/INFUSION INTRACRANIAL INC DIAG ANGIO  06/04/2019  . IR US GUIDE VASC ACCESS RIGHT  06/04/2019  . OPEN REDUCTION INTERNAL FIXATION (ORIF) DISTAL RADIAL FRACTURE Left 09/20/2017   Procedure: OPEN REDUCTION INTERNAL FIXATION (ORIF) DISTAL RADIAL FRACTURE;  Surgeon: Leanora Cover, MD;  Location: [place];  Service: Orthopedics;  Laterality: Left;  . RADIOLOGY WITH ANESTHESIA N/A 06/04/2019   Procedure: IR WITH ANESTHESIA;  Surgeon: Radiologist, Medication, MD;  Location: [place];  Service: Radiology;  Laterality: N/A;  . TEE WITHOUT CARDIOVERSION N/A 06/08/2019   Procedure: TRANSESOPHAGEAL ECHOCARDIOGRAM (TEE);  Surgeon: Buford Dresser, MD;  Location: [facility] ENDOSCOPY;  Service: Cardiovascular;  Laterality: N/A;    There  were no vitals filed for this visit.   ADULT SLP TREATMENT - 07/02/19 0001      General Information   Behavior/Cognition  Alert;Cooperative;Pleasant mood    Patient Positioning  Upright in chair    Oral care provided  N/A    HPI  Scott Day is a 59 y.o. right-handed male with history of colon cancer 2012, GERD, tobacco abuse.  Per chart review lives alone independent prior to admission.  Presented 06/04/2019 with left-sided weakness and slurred speech. Cranial CT scan negative for acute changes.  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  Patient did not receive TPA.  Underwent revascularization stent placement per interventional radiology.  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  He was admitted to inpatient rehab from 3/20-3/26/21 for PT, OT, and SLP and discharged home with supervision from family. He was referred for OP SLP evaluation and treatment.       Treatment Provided   Treatment provided  Cognitive-Linquistic      Pain Assessment   Pain Assessment  No/denies pain      Cognitive-Linquistic Treatment   Treatment focused on  Cognition;Patient/family/caregiver education    Skilled Treatment  visual scanning, planning, hypothetical problem solving, self evaluation, Pt/ caregiver education      Assessment / Recommendations / Plan   Plan  Continue with current plan of  care      Progression Toward Goals   Progression toward goals  Progressing toward goals       SLP Education - 07/02/19 1225    Education Details  Provided HEP with planning, scanning, and organization tasks    Person(s) Educated  Patient    Methods  Handout    Comprehension  Verbalized understanding       SLP Short Term Goals - 07/02/19 1226      SLP SHORT TERM GOAL #1   Title  Pt will utilize the "stop and think" strategy during moderately complex planning tasks with indirect cues on 9 of 10 trials.     Baseline  Impulsivity noted with planning tasks and verbal cues to wait before attempting to complete.    Time  4    Period  Weeks    Status  On-going    Target Date  07/26/19      SLP SHORT TERM GOAL #2   Title  Pt will verbalize steps to complete moderately complex functional tasks involving 5+ steps with 90% acc with min assist.    Baseline  85%    Time  4    Period  Weeks    Status  On-going    Target Date  07/26/19      SLP SHORT TERM GOAL #3   Title  Pt will complete moderately complex planning task with 90% acc with min assist.    Baseline  85%    Time  4    Period  Weeks    Status  On-going    Target Date  07/26/19       SLP Long Term Goals - 07/02/19 1227      SLP LONG TERM GOAL #1   Title  Same as short       Plan - 07/02/19 1226    Clinical Impression Statement Pt was accompanied to therapy by his daughter. Pt is demonstrating increased awareness into his deficits as he reports that "things aren't working right", but was unable to verbalize why. He did indicate that he was afraid to use some power tools. His daughter reported that he easily becomes tearful, quick to become frustrated, has reduced concept of time passage, and has difficulty with impulse control. He has not gone back over to help oversee the construction on his brother's house. Pt reports that he is eager to drive and verbalizes that he thinks he is able to do so safely. SLP administered Trails A (2 minutes and 10 seconds with average score being 29 seconds) and Trails B (5+ minutes with average completing in 75 seconds). He was given a written planning task and only re-wrote the tasks in order already given. We will address this next session. Pt will likely need more SLP sessions than originally planned.    Speech Therapy Frequency  1x /week    Duration  4 weeks    Treatment/Interventions  Compensatory strategies;Patient/family education;SLP instruction and feedback;Internal/external aids;Compensatory  techniques;Cognitive reorganization    Potential to Achieve Goals  Good    SLP Home Exercise Plan  Pt will complete HEP as assigned to facilitate carryover of treatment strategies in home environment with written cues.    Consulted and Agree with Plan of Care  Patient       Patient will benefit from skilled therapeutic intervention in order to improve the following deficits and impairments:   Cognitive communication deficit    Problem List Patient Active Problem List   Diagnosis  Date Noted  . Labile blood glucose   . Leukocytosis   . Tobacco abuse   . Dysphagia, post-stroke   . E. coli UTI   . Diabetes mellitus, new onset ([place])   . 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 Cerebrovascular accident (CVA) due to thrombosis of right middle cerebral artery ([place])   . Essential hypertension   . Oropharyngeal dysphagia   . Left hemiparesis ([place])   . Class 1 obesity due to excess calories with serious comorbidity and body mass index (BMI) of 32.0 to 32.9 in adult   . Acute ischemic cerebrovascular accident (CVA) involving right middle cerebral artery territory ([place]) 06/04/2019  . Arterial ischemic stroke, MCA, right, acute [facility]) 06/04/2019   Thank you,  Genene Churn, [place]  [facility] 07/02/2019, 12:52 PM  [facility] [address] [place], [place], [address] Phone: [phone]   Fax:  [phone]   Name: Scott Day MRN: [id number] Date of Birth: [birth year]

## 2019-07-03 ENCOUNTER — Encounter (HOSPITAL_COMMUNITY): Payer: Self-pay | Admitting: Physical Therapy

## 2019-07-03 ENCOUNTER — Ambulatory Visit (HOSPITAL_COMMUNITY): Payer: Self-pay

## 2019-07-04 ENCOUNTER — Ambulatory Visit (HOSPITAL_COMMUNITY): Payer: Medicaid Other | Admitting: Physical Therapy

## 2019-07-04 ENCOUNTER — Encounter (HOSPITAL_COMMUNITY): Payer: Self-pay | Admitting: Physical Therapy

## 2019-07-04 ENCOUNTER — Ambulatory Visit: Payer: Self-pay | Admitting: Family Medicine

## 2019-07-04 ENCOUNTER — Other Ambulatory Visit: Payer: Self-pay

## 2019-07-04 DIAGNOSIS — R262 Difficulty in walking, not elsewhere classified: Secondary | ICD-10-CM

## 2019-07-04 DIAGNOSIS — R2689 Other abnormalities of gait and mobility: Secondary | ICD-10-CM | POA: Diagnosis not present

## 2019-07-04 NOTE — Therapy (Addendum)
[facility] [address] [place], [place], [address] Phone: [phone]   Fax:  [phone]  Physical Therapy Treatment  Patient Details  Name: Scott Day MRN: [id number] Date of Birth: [birth year] Referring Provider (PT): Princella Ion    Encounter Date: 07/04/2019  PT End of Session - 07/04/19 0827    Visit Number  4    Number of Visits  8    Date for PT Re-Evaluation  07/20/19    Authorization Type  Self pay    Authorization Time Period  06/21/19- 07/20/19    Progress Note Due on Visit  8    PT Start Time  0817    PT Stop Time  0855    PT Time Calculation (min)  38 min    Equipment Utilized During Treatment  Gait belt    Activity Tolerance  Patient tolerated treatment well    Behavior During Therapy  [facility] for tasks assessed/performed       Past Medical History:  Diagnosis Date  . Cancer [facility]) 2012   colon cancer  . Closed fracture of left distal radius   . GERD (gastroesophageal reflux disease)     Past Surgical History:  Procedure Laterality Date  . BUBBLE STUDY  06/08/2019   Procedure: BUBBLE STUDY;  Surgeon: Buford Dresser, MD;  Location: [facility] ENDOSCOPY;  Service: Cardiovascular;;  . COLON SURGERY  2012   colon cancer  . IR CT HEAD LTD  06/04/2019  . IR CT HEAD LTD  06/04/2019  . IR INTRA CRAN STENT  06/04/2019  . IR PERCUTANEOUS ART THROMBECTOMY/INFUSION INTRACRANIAL INC DIAG ANGIO  06/04/2019  . IR US GUIDE VASC ACCESS RIGHT  06/04/2019  . OPEN REDUCTION INTERNAL FIXATION (ORIF) DISTAL RADIAL FRACTURE Left 09/20/2017   Procedure: OPEN REDUCTION INTERNAL FIXATION (ORIF) DISTAL RADIAL FRACTURE;  Surgeon: Leanora Cover, MD;  Location: [place];  Service: Orthopedics;  Laterality: Left;  . RADIOLOGY WITH ANESTHESIA N/A 06/04/2019   Procedure: IR WITH ANESTHESIA;  Surgeon: Radiologist, Medication, MD;  Location: [place];  Service: Radiology;  Laterality: N/A;  . TEE WITHOUT CARDIOVERSION N/A 06/08/2019    Procedure: TRANSESOPHAGEAL ECHOCARDIOGRAM (TEE);  Surgeon: Buford Dresser, MD;  Location[facility] ENDOSCOPY;  Service: Cardiovascular;  Laterality: N/A;    There were no vitals filed for this visit.  Subjective Assessment - 07/04/19 0827    Subjective  Patient denied any pain or issues. Reported he is going fishing today.    Limitations  Lifting;Standing;Walking;House hold activities    Patient Stated Goals  Get back to normal    Currently in Pain?  No/denies                            Balance Exercises - 07/04/19 0828      Balance Exercises: Standing   Standing Eyes Closed  Narrow base of support (BOS);3 reps;30 secs    Tandem Stance  Eyes open;Foam/compliant surface;25 secs;1 rep   Then bil UE flexion with 1# weight x10 each LE forward   SLS with Vectors  Foam/compliant surface;10 secs;5 reps    Rockerboard  Anterior/posterior;Lateral;10 reps    Step Ups  Forward;Lateral   On BOSU x10 each LE bil UE assist   Tandem Gait  Forward;2 reps   Foam 15'    Sidestepping  Foam/compliant support;2 reps    Step Over Hurdles / Cones  stepping over (4) 6 inch hurdles  15' 3RT fwd, 3RT  lateral     Other Standing Exercises Comments  Sit to stands with yellow weighted ball x 10          PT Short Term Goals - 06/21/19 0859      PT SHORT TERM GOAL #1   Title  Patient will be independent with initial HEP to improve functional outcomes    Time  2    Period  Weeks    Status  New    Target Date  07/06/19        PT Long Term Goals - 06/21/19 1056      PT LONG TERM GOAL #1   Title  Patient will reports no falls since starting therapy showing improved safety awareness and functional outcomes.    Time  4    Period  Weeks    Status  New    Target Date  07/20/19      PT LONG TERM GOAL #2   Title  Patient will feel confident with ambulation and ADLs in/around his home and on uneven/outdoor surfaces to indicate reduced risk of falls and improved funciton loutcomes.     Time  4    Period  Weeks    Status  New    Target Date  07/20/19      PT LONG TERM GOAL #3   Title  Patient will improve DGI by 3 points to indicate reduced falls risk and improved funcitoal outcomes.    Time  4    Period  Weeks    Status  New    Target Date  07/20/19            Plan - 07/04/19 0901    Clinical Impression Statement  Focused on high level balance exercises this session. Added tandem and sidestepping foam with intermittent upper extremity assist. Patient reported increased difficulty when his eyes are closed with his balance and therefore added balance activities with eyes closed. Patient demonstrated some wobbliness with eyes closed while performing standing balance with eyes closed. Patient did continue to have some difficulty with following instructions and required frequent cues with exercises for sequence. Patient did report having some difficulty with his vision since his stroke, which therapist educated patient to discuss with his MD as a stroke can have effects on a person's vision.    Examination-Activity Limitations  Locomotion Level;Lift    Examination-Participation Restrictions  Community Activity;Yard Work;Driving    Stability/Clinical Decision Making  Stable/Uncomplicated    Rehab Potential  Good    PT Frequency  2x / week    PT Duration  4 weeks    PT Treatment/Interventions  ADLs/Self Care Home Management;Aquatic Therapy;Biofeedback;Cryotherapy;Electrical Stimulation;Iontophoresis 4mg /ml Dexamethasone;Moist Heat;Balance training;Therapeutic exercise;Manual techniques;Therapeutic activities;Functional mobility training;Stair training;Orthotic Fit/Training;Gait training;Patient/family education;DME Instruction;Neuromuscular re-education;Ultrasound;Parrafin;Visual/perceptual remediation/compensation;Joint Manipulations;Passive range of motion;Dry needling;Energy conservation;Splinting;Taping;Vasopneumatic Device;Vestibular    PT Next Visit Plan  Add  congitive factor to balance (name colors while gait training). Progress challenge with eyes closed activities.    PT Home Exercise Plan  06/27/19: sit to stand, tandem stance, SLS    Consulted and Agree with Plan of Care  Patient       Patient will benefit from skilled therapeutic intervention in order to improve the following deficits and impairments:  Abnormal gait, Decreased coordination, Decreased balance, Decreased safety awareness  Visit Diagnosis: Other abnormalities of gait and mobility  Difficulty in walking, not elsewhere classified    Problem List Patient Active Problem List   Diagnosis Date Noted  . Labile blood glucose   .  Leukocytosis   . Tobacco abuse   . Dysphagia, post-stroke   . E. coli UTI   . Diabetes mellitus, new onset ([place])   . 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 Cerebrovascular accident (CVA) due to thrombosis of right middle cerebral artery ([place])   . Essential hypertension   . Oropharyngeal dysphagia   . Left hemiparesis ([place])   . Class 1 obesity due to excess calories with serious comorbidity and body mass index (BMI) of 32.0 to 32.9 in adult   . Acute ischemic cerebrovascular accident (CVA) involving right middle cerebral artery territory ([place]) 06/04/2019  . Arterial ischemic stroke, MCA, right, acute ([place]) 06/04/2019   Clarene Critchley PT, DPT 9:05 AM, 07/04/19 [place] [address] [place], [place], [address] Phone: [phone]   Fax:  [phone]  Name: Scott Day MRN: [id number] Date of Birth: [birth year]

## 2019-07-05 ENCOUNTER — Ambulatory Visit (HOSPITAL_COMMUNITY): Payer: Medicaid Other | Admitting: Physical Therapy

## 2019-07-05 ENCOUNTER — Encounter (HOSPITAL_COMMUNITY): Payer: Self-pay | Admitting: Physical Therapy

## 2019-07-05 DIAGNOSIS — R2689 Other abnormalities of gait and mobility: Secondary | ICD-10-CM

## 2019-07-05 DIAGNOSIS — R262 Difficulty in walking, not elsewhere classified: Secondary | ICD-10-CM

## 2019-07-05 NOTE — Therapy (Signed)
[facility] [address] [place], [place], [address] Phone: [phone]   Fax:  [phone]  Physical Therapy Treatment  Patient Details  Name: Scott Day MRN: [id number] Date of Birth: 08/03/1960 Referring Provider (PT): Princella Ion    Encounter Date: 07/05/2019  PT End of Session - 07/05/19 0822    Visit Number  5    Number of Visits  8    Date for PT Re-Evaluation  07/20/19    Authorization Type  Self pay    Authorization Time Period  06/21/19- 07/20/19    Progress Note Due on Visit  8    PT Start Time  0820    PT Stop Time  0905    PT Time Calculation (min)  45 min    Equipment Utilized During Treatment  Gait belt    Activity Tolerance  Patient tolerated treatment well    Behavior During Therapy  [facility] for tasks assessed/performed       Past Medical History:  Diagnosis Date  . Cancer [facility]) 2012   colon cancer  . Closed fracture of left distal radius   . GERD (gastroesophageal reflux disease)     Past Surgical History:  Procedure Laterality Date  . BUBBLE STUDY  06/08/2019   Procedure: BUBBLE STUDY;  Surgeon: Buford Dresser, MD;  Location: [facility] ENDOSCOPY;  Service: Cardiovascular;;  . COLON SURGERY  2012   colon cancer  . IR CT HEAD LTD  06/04/2019  . IR CT HEAD LTD  06/04/2019  . IR INTRA CRAN STENT  06/04/2019  . IR PERCUTANEOUS ART THROMBECTOMY/INFUSION INTRACRANIAL INC DIAG ANGIO  06/04/2019  . IR US GUIDE VASC ACCESS RIGHT  06/04/2019  . OPEN REDUCTION INTERNAL FIXATION (ORIF) DISTAL RADIAL FRACTURE Left 09/20/2017   Procedure: OPEN REDUCTION INTERNAL FIXATION (ORIF) DISTAL RADIAL FRACTURE;  Surgeon: Leanora Cover, MD;  Location: [place];  Service: Orthopedics;  Laterality: Left;  . RADIOLOGY WITH ANESTHESIA N/A 06/04/2019   Procedure: IR WITH ANESTHESIA;  Surgeon: Radiologist, Medication, MD;  Location: [place];  Service: Radiology;  Laterality: N/A;  . TEE WITHOUT CARDIOVERSION N/A 06/08/2019    Procedure: TRANSESOPHAGEAL ECHOCARDIOGRAM (TEE);  Surgeon: Buford Dresser, MD;  Location: [facility] ENDOSCOPY;  Service: Cardiovascular;  Laterality: N/A;    There were no vitals filed for this visit.  Subjective Assessment - 07/05/19 0821    Subjective  Patietn says he is doing really well but notes that speech therapy is difficult in part due to his vision with reading.    Limitations  Lifting;Standing;Walking;House hold activities    Patient Stated Goals  Get back to normal    Currently in Pain?  No/denies                            Balance Exercises - 07/05/19 1317      Balance Exercises: Standing   Standing Eyes Closed  Narrow base of support (BOS);3 reps;30 secs    Tandem Stance  Eyes open;Foam/compliant surface;Eyes closed;3 reps;20 secs    Rockerboard  Anterior/posterior;Lateral;10 reps    Step Ups  Forward;Lateral   10 x each on BOSU   Gait with Head Turns  Forward;Cognitive challenge;Other reps (comment)   6 reps, 2 naming colors, 2 naming animals, 2 counting by 3s   Tandem Gait  Forward;Foam/compliant surface;3 reps    Sidestepping  Foam/compliant support;3 reps          PT Short Term Goals -  06/21/19 0859      PT SHORT TERM GOAL #1   Title  Patient will be independent with initial HEP to improve functional outcomes    Time  2    Period  Weeks    Status  New    Target Date  07/06/19        PT Long Term Goals - 06/21/19 1056      PT LONG TERM GOAL #1   Title  Patient will reports no falls since starting therapy showing improved safety awareness and functional outcomes.    Time  4    Period  Weeks    Status  New    Target Date  07/20/19      PT LONG TERM GOAL #2   Title  Patient will feel confident with ambulation and ADLs in/around his home and on uneven/outdoor surfaces to indicate reduced risk of falls and improved funciton loutcomes.    Time  4    Period  Weeks    Status  New    Target Date  07/20/19      PT LONG TERM GOAL  #3   Title  Patient will improve DGI by 3 points to indicate reduced falls risk and improved funcitoal outcomes.    Time  4    Period  Weeks    Status  New    Target Date  07/20/19            Plan - 07/05/19 1320    Clinical Impression Statement  Patient is doing well with static and dynamic balance activity. Patient shows improvement in static balance on compliant surface with eyes closed today, and shows appropriate righting strategy when he begins to lean outside BOS. Added tandem gait on balance beam today, patient was well challenged with this. Patient demos some level of LT ankle instability, as LT ankle tends to invert during this causing patient to lose his footing. Worked on ankle proprioception with rocker board and fwd and lateral step ups on BOSU. Patient does very well with these, but continued to have trouble with tandem gait on balance beam trial afterward. Added cognitive challenge to gait training with head turns today. Patient also well challenged with this. Patient showed decreased gait speed, and required frequent cues for maintained head turning when concentrating on other cognitive task. Will continue to progress as able.    Examination-Activity Limitations  Locomotion Level;Lift    Examination-Participation Restrictions  Community Activity;Yard Work;Driving    Stability/Clinical Decision Making  Stable/Uncomplicated    Rehab Potential  Good    PT Frequency  2x / week    PT Duration  4 weeks    PT Treatment/Interventions  ADLs/Self Care Home Management;Aquatic Therapy;Biofeedback;Cryotherapy;Electrical Stimulation;Iontophoresis 4mg /ml Dexamethasone;Moist Heat;Balance training;Therapeutic exercise;Manual techniques;Therapeutic activities;Functional mobility training;Stair training;Orthotic Fit/Training;Gait training;Patient/family education;DME Instruction;Neuromuscular re-education;Ultrasound;Parrafin;Visual/perceptual remediation/compensation;Joint Manipulations;Passive  range of motion;Dry needling;Energy conservation;Splinting;Taping;Vasopneumatic Device;Vestibular    PT Next Visit Plan  Continue gait training with cognitive challenge. Progress challenge with eyes closed activities.    PT Home Exercise Plan  06/27/19: sit to stand, tandem stance, SLS    Consulted and Agree with Plan of Care  Patient       Patient will benefit from skilled therapeutic intervention in order to improve the following deficits and impairments:  Abnormal gait, Decreased coordination, Decreased balance, Decreased safety awareness  Visit Diagnosis: Other abnormalities of gait and mobility  Difficulty in walking, not elsewhere classified     Problem List Patient Active Problem List   Diagnosis  Date Noted  . Labile blood glucose   . Leukocytosis   . Tobacco abuse   . Dysphagia, post-stroke   . E. coli UTI   . Diabetes mellitus, new onset ([place])   . 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 Cerebrovascular accident (CVA) due to thrombosis of right middle cerebral artery ([place])   . Essential hypertension   . Oropharyngeal dysphagia   . Left hemiparesis ([place])   . Class 1 obesity due to excess calories with serious comorbidity and body mass index (BMI) of 32.0 to 32.9 in adult   . Acute ischemic cerebrovascular accident (CVA) involving right middle cerebral artery territory ([place]) 06/04/2019  . Arterial ischemic stroke, MCA, right, acute ([place]) 06/04/2019   1:29 PM, 07/05/19 Josue Hector PT DPT  Physical Therapist with [facility]  [phone] [place] [address] [place], [place], [address] Phone: [phone]   Fax:  [phone]  Name: Scott Day MRN: [id number] Date of Birth: [birth year]

## 2019-07-08 NOTE — Progress Notes (Signed)
Cardiology Office Note Date:  07/09/2019  Patient ID:  Scott Day, DOB 03-24-1960, MRN [id number] PCP:  Patient, No Pcp Per  Cardiologist:  Dr. Caryl Comes (new at the hospital)    Chief Complaint: post hospital f/u, discuss loop  History of Present Illness: Scott Day is a 59 y.o. male with history of colon cancer only prior the stroke in March.   E comes in today to be seen for Dr. Caryl Comes, las seen at the consultation to discuss loop post stroke. Unfortunately at the time was uninsured, he wanted to proceed with loop, though planned to wait until insurance came through. Discharged from rehab 06/15/2019.  He is accompanied by his daughter today.  He wanted to keep the appointment to revisit the loop rational and procedure.  He has completed all the paperwork for medicaid and disability and were told everything was in order and hopefully will hear back confirming coverage soon.  He remains with some residual L sided deficit/neglet, but PT is helping significantly.  He has had some increased emotional response to the stroke of late as well, though has great family support. No CP, palpitations, no SOB, no near syncope or syncope.   Past Medical History:  Diagnosis Date  . Cancer [facility]) 2012   colon cancer  . Closed fracture of left distal radius   . GERD (gastroesophageal reflux disease)     Past Surgical History:  Procedure Laterality Date  . BUBBLE STUDY  06/08/2019   Procedure: BUBBLE STUDY;  Surgeon: Buford Dresser, MD;  Location: [facility] ENDOSCOPY;  Service: Cardiovascular;;  . COLON SURGERY  2012   colon cancer  . IR CT HEAD LTD  06/04/2019  . IR CT HEAD LTD  06/04/2019  . IR INTRA CRAN STENT  06/04/2019  . IR PERCUTANEOUS ART THROMBECTOMY/INFUSION INTRACRANIAL INC DIAG ANGIO  06/04/2019  . IR US GUIDE VASC ACCESS RIGHT  06/04/2019  . OPEN REDUCTION INTERNAL FIXATION (ORIF) DISTAL RADIAL FRACTURE Left 09/20/2017   Procedure: OPEN REDUCTION INTERNAL FIXATION (ORIF) DISTAL  RADIAL FRACTURE;  Surgeon: Leanora Cover, MD;  Location: [place];  Service: Orthopedics;  Laterality: Left;  . RADIOLOGY WITH ANESTHESIA N/A 06/04/2019   Procedure: IR WITH ANESTHESIA;  Surgeon: Radiologist, Medication, MD;  Location: [place];  Service: Radiology;  Laterality: N/A;  . TEE WITHOUT CARDIOVERSION N/A 06/08/2019   Procedure: TRANSESOPHAGEAL ECHOCARDIOGRAM (TEE);  Surgeon: Buford Dresser, MD;  Location: [facility] ENDOSCOPY;  Service: Cardiovascular;  Laterality: N/A;    Current Outpatient Medications  Medication Sig Dispense Refill  . acetaminophen (TYLENOL) 325 MG tablet Take 2 tablets (650 mg total) by mouth every 4 (four) hours as needed for mild pain (or temp > 37.5 C (99.5 F)).    Marland Kitchen aspirin 81 MG chewable tablet Chew 1 tablet (81 mg total) by mouth daily.    Marland Kitchen atorvastatin (LIPITOR) 80 MG tablet Take 1 tablet (80 mg total) by mouth daily at 6 PM. 30 tablet 0  . metFORMIN (GLUCOPHAGE) 500 MG tablet Take 0.5 tablets (250 mg total) by mouth daily with breakfast. 30 tablet 0  . nicotine (NICODERM CQ - DOSED IN MG/24 HOURS) 14 mg/24hr patch 14 mg patch daily x2 weeks then 7 mg patch daily x3 weeks and stop 28 patch 0  . ticagrelor (BRILINTA) 90 MG TABS tablet Take 1 tablet (90 mg total) by mouth 2 (two) times daily. 60 tablet 1   No current facility-administered medications for this visit.    Allergies:   Patient has  no known allergies.   Social History:  The patient  reports that he has quit smoking. He has never used smokeless tobacco. He reports current alcohol use of about 24.0 standard drinks of alcohol per week. He reports that he does not use drugs.   Family History:  The patient's negative for cardiac  ROS:  Please see the history of present illness.  All other systems are reviewed and otherwise negative.   PHYSICAL EXAM:  VS:  BP 135/75   Pulse 78   Ht 5\' 9"  (1.753 m)   Wt 226 lb (102.5 kg)   BMI 33.37 kg/m  BMI: Body mass index is 33.37 kg/m.  Well nourished, well developed, in no acute distress  HEENT: normocephalic, atraumatic  Neck: no JVD, carotid bruits or masses Cardiac:  RRR; no significant murmurs, no rubs, or gallops Lungs:  CTA b/l, no wheezing, rhonchi or rales  Abd: soft, nontender, obese MS: no deformity or atrophy Ext: no edema  Skin: warm and dry, no rash Neuro:  No gross deficits appreciated Psych: euthymic mood, full affect    EKG:  Not done today   06/08/2019: TEE LEFT VENTRICLE: EF = 65%. No regional wall motion abnormalities. RIGHT VENTRICLE: Normal size and function.  LEFT ATRIUM: No thrombus/mass. LEFT ATRIAL APPENDAGE: No thrombus/mass.  RIGHT ATRIUM: No thrombus/mass. AORTIC VALVE:  Trileaflet. No regurgitation. No vegetation. MITRAL VALVE:    Normal structure. Trivial regurgitation. No vegetation. TRICUSPID VALVE: Normal structure. Trivial regurgitation. No vegetation. PULMONIC VALVE: Grossly normal structure. Trivial regurgitation. No apparent vegetation. INTERATRIAL SEPTUM: No PFO or ASD seen by color Doppler. Agitated saline contrast used, negative for intra-atrial right to left shunt. PERICARDIUM: No effusion noted DESCENDING AORTA: Mild diffuse plaque seen CONCLUSION: No cardiac source of embolism. Negative for PFO or right to left shunt.   06/05/2019: TTE IMPRESSIONS  1. Left ventricular ejection fraction, by estimation, is 65 to 70%. The  left ventricle has normal function. The left ventricle has no regional  wall motion abnormalities. There is severe left ventricular hypertrophy.  Left ventricular diastolic parameters  were normal.  2. Right ventricular systolic function is normal. The right ventricular  size is normal.  3. The mitral valve is abnormal. Trivial mitral valve regurgitation.  4. The aortic valve is tricuspid. Aortic valve regurgitation is not  visualized. No aortic stenosis is present.  5. The inferior vena cava is normal in size with <50% respiratory   variability, suggesting right atrial pressure of 8 mmHg.   06/06/2019: LE venous US Summary: BILATERAL: - No evidence of deep vein thrombosis seen in the lower extremities, bilaterally   Recent Labs: 06/11/2019: ALT 42; BUN 10; Creatinine, Ser 1.07; Hemoglobin 15.1; Platelets 236; Potassium 3.8; Sodium 137  06/05/2019: Cholesterol 180; HDL 28; LDL Cholesterol 126; Total CHOL/HDL Ratio 6.4; Triglycerides 131; VLDL 26   CrCl cannot be calculated (Patient's most recent lab result is older than the maximum 21 days allowed.).   Wt Readings from Last 3 Encounters:  07/09/19 226 lb (102.5 kg)  06/26/19 223 lb 6.4 oz (101.3 kg)  06/04/19 231 lb 7.7 oz (105 kg)     Other studies reviewed: Additional studies/records reviewed today include: summarized above  ASSESSMENT AND PLAN:  1. Cryptogenic stroke      We revisited the rational for monitoring for AFib in the environment of his stroke Discussed the implant procedure, potential risks and benefits.  They would like to proceed and will call to schedule with dr. Caryl Comes to be done here  in the office once his insurance is confirmed/active.  Disposition: as above  Current medicines are reviewed at length with the patient today.  The patient did not have any concerns regarding medicines.  Venetia Night, PA-C 07/09/2019 4:08 PM     [facility] [address] [place] [address] [phone] (office)  [phone] (fax)

## 2019-07-09 ENCOUNTER — Other Ambulatory Visit: Payer: Self-pay

## 2019-07-09 ENCOUNTER — Ambulatory Visit (INDEPENDENT_AMBULATORY_CARE_PROVIDER_SITE_OTHER): Payer: Medicaid Other | Admitting: Physician Assistant

## 2019-07-09 ENCOUNTER — Encounter (HOSPITAL_COMMUNITY): Payer: Self-pay | Admitting: Physical Therapy

## 2019-07-09 VITALS — BP 135/75 | HR 78 | Ht 69.0 in | Wt 226.0 lb

## 2019-07-09 DIAGNOSIS — I639 Cerebral infarction, unspecified: Secondary | ICD-10-CM | POA: Diagnosis not present

## 2019-07-09 NOTE — Patient Instructions (Signed)
Medication Instructions:   Your physician recommends that you continue on your current medications as directed. Please refer to the Current Medication list given to you today.  *If you need a refill on your cardiac medications before your next appointment, please call your pharmacy*   Lab Work: [place]   If you have labs (blood work) drawn today and your tests are completely normal, you will receive your results only by: Marland Kitchen MyChart Message (if you have MyChart) OR . A paper copy in the mail If you have any lab test that is abnormal or we need to change your treatment, we will call you to review the results.   Testing/Procedures: NONE ORDERED  TODAY   Follow-Up: At [facility], you and your health needs are our priority.  As part of our continuing mission to provide you with exceptional heart care, we have created designated Provider Care Teams.  These Care Teams include your primary Cardiologist (physician) and Advanced Practice Providers (APPs -  Physician Assistants and Nurse Practitioners) who all work together to provide you with the care you need, when you need it.  We recommend signing up for the patient portal called "MyChart".  Sign up information is provided on this After Visit Summary.  MyChart is used to connect with patients for Virtual Visits (Telemedicine).  Patients are able to view lab/test results, encounter notes, upcoming appointments, etc.  Non-urgent messages can be sent to your provider as well.   To learn more about what you can do with MyChart, go to [email or web address].    Your next appointment: PLEASE CALL BACK TO SPEAKS TO DR [place] TO SET UP  FOR LOOP RECORDER ONCE INSURANCE PROCESS IS COMPLETE.

## 2019-07-10 ENCOUNTER — Ambulatory Visit (HOSPITAL_COMMUNITY): Payer: Medicaid Other | Admitting: Speech Pathology

## 2019-07-10 ENCOUNTER — Encounter (HOSPITAL_COMMUNITY): Payer: Self-pay | Admitting: Speech Pathology

## 2019-07-10 ENCOUNTER — Encounter (HOSPITAL_COMMUNITY): Payer: Self-pay | Admitting: Physical Therapy

## 2019-07-10 DIAGNOSIS — R2689 Other abnormalities of gait and mobility: Secondary | ICD-10-CM | POA: Diagnosis not present

## 2019-07-10 DIAGNOSIS — R41841 Cognitive communication deficit: Secondary | ICD-10-CM

## 2019-07-10 NOTE — Therapy (Signed)
[facility] [place], [place], [address] Phone: [phone]   Fax:  [phone]  Speech Language Pathology Treatment  Patient Details  Name: Scott Day MRN: [id number] Date of Birth: 1960/07/24 Referring Provider (SLP): Lauraine Rinne, PA-C   Encounter Date: 07/10/2019  End of Session - 07/10/19 1754    Visit Number  3    Number of Visits  5    Date for SLP Re-Evaluation  07/26/19    Authorization Type  Medicaid pending    SLP Start Time  1330    SLP Stop Time   [id number]    SLP Time Calculation (min)  55 min    Activity Tolerance  Patient tolerated treatment well       Past Medical History:  Diagnosis Date  . Cancer [facility]) 2012   colon cancer  . Closed fracture of left distal radius   . GERD (gastroesophageal reflux disease)     Past Surgical History:  Procedure Laterality Date  . BUBBLE STUDY  06/08/2019   Procedure: BUBBLE STUDY;  Surgeon: Buford Dresser, MD;  Location: [facility] ENDOSCOPY;  Service: Cardiovascular;;  . COLON SURGERY  2012   colon cancer  . IR CT HEAD LTD  06/04/2019  . IR CT HEAD LTD  06/04/2019  . IR INTRA CRAN STENT  06/04/2019  . IR PERCUTANEOUS ART THROMBECTOMY/INFUSION INTRACRANIAL INC DIAG ANGIO  06/04/2019  . IR US GUIDE VASC ACCESS RIGHT  06/04/2019  . OPEN REDUCTION INTERNAL FIXATION (ORIF) DISTAL RADIAL FRACTURE Left 09/20/2017   Procedure: OPEN REDUCTION INTERNAL FIXATION (ORIF) DISTAL RADIAL FRACTURE;  Surgeon: Leanora Cover, MD;  Location: [place];  Service: Orthopedics;  Laterality: Left;  . RADIOLOGY WITH ANESTHESIA N/A 06/04/2019   Procedure: IR WITH ANESTHESIA;  Surgeon: Radiologist, Medication, MD;  Location: [place];  Service: Radiology;  Laterality: N/A;  . TEE WITHOUT CARDIOVERSION N/A 06/08/2019   Procedure: TRANSESOPHAGEAL ECHOCARDIOGRAM (TEE);  Surgeon: Buford Dresser, MD;  Location: [facility] ENDOSCOPY;  Service: Cardiovascular;  Laterality: N/A;    There  were no vitals filed for this visit.  Subjective Assessment - 07/10/19 1356    Subjective  "Yes we are having trouble with that." (sequencing and planning)    Patient is accompained by:  Family member    Currently in Pain?  No/denies            ADULT SLP TREATMENT - 07/10/19 1357      General Information   Behavior/Cognition  Alert;Cooperative;Pleasant mood    Patient Positioning  Upright in chair    Oral care provided  N/A    HPI  Scott Day is a 59 y.o. right-handed male with history of colon cancer 2012, GERD, tobacco abuse.  Per chart review lives alone independent prior to admission.  Presented 06/04/2019 with left-sided weakness and slurred speech. Cranial CT scan negative for acute changes.  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  Patient did not receive TPA.  Underwent revascularization stent placement per interventional radiology.  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  He was admitted to inpatient rehab from 3/20-3/26/21 for PT, OT, and SLP and discharged home with supervision from family. He was referred for OP SLP evaluation and treatment.       Treatment Provided   Treatment provided  Cognitive-Linquistic      Pain Assessment   Pain Assessment  No/denies pain  Cognitive-Linquistic Treatment   Treatment focused on  Cognition;Patient/family/caregiver education    Skilled Treatment  visual scanning, planning, hypothetical problem solving, self evaluation, Pt/ caregiver education      Assessment / Recommendations / Plan   Plan  Continue with current plan of care      Progression Toward Goals   Progression toward goals  Progressing toward goals         SLP Short Term Goals - 07/10/19 1755      SLP SHORT TERM GOAL #1   Title  Pt will utilize the "stop and think" strategy during moderately complex planning tasks with indirect cues on 9 of 10 trials.     Baseline  Impulsivity noted with planning tasks and verbal cues to wait before attempting to complete.    Time  4    Period  Weeks    Status  On-going    Target Date  07/26/19      SLP SHORT TERM GOAL #2   Title  Pt will verbalize steps to complete moderately complex functional tasks involving 5+ steps with 90% acc with min assist.    Baseline  85%    Time  4    Period  Weeks    Status  On-going    Target Date  07/26/19      SLP SHORT TERM GOAL #3   Title  Pt will complete moderately complex planning task with 90% acc with min assist.    Baseline  85%    Time  4    Period  Weeks    Status  On-going    Target Date  07/26/19       SLP Long Term Goals - 07/10/19 1755      SLP LONG TERM GOAL #1   Title  Same as short       Plan - 07/10/19 1755    Clinical Impression Statement  Pt was accompanied to therapy by his daughter. Both Pt and his daughter indicate that he seems to be functioning well at home in regards to completing tasks he typically does (feeding chickens, fishing, etc). He does not do any cooking and never did before. In session, SLP provided written and verbal cues to complete a planning task. Pt was visibly frustrated with the task, but did respond to verbal SLP prompts. He demonstrated improved performance when SLP provided each task on a separate piece of paper to manipulate and sequence. Pt will likely respond better to functional tasks to him. He is afraid to use a saw due to left neglect, however he will try to make a chicken coop/cage with his son. Next session, will have Pt measure and help hang two pictures according so specific parameters given.    Speech Therapy Frequency  1x /week    Duration  4 weeks    Treatment/Interventions  Compensatory strategies;Patient/family education;SLP instruction and feedback;Internal/external aids;Compensatory techniques;Cognitive reorganization    Potential to Achieve Goals  Good    SLP Home Exercise Plan  Pt will complete  HEP as assigned to facilitate carryover of treatment strategies in home environment with written cues.    Consulted and Agree with Plan of Care  Patient       Patient will benefit from skilled therapeutic intervention in order to improve the following deficits and impairments:   Cognitive communication deficit    Problem List Patient Active Problem List   Diagnosis Date Noted  . Labile blood glucose   . Leukocytosis   . Tobacco  abuse   . Dysphagia, post-stroke   . E. coli UTI   . Diabetes mellitus, new onset ([place])   . 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 Cerebrovascular accident (CVA) due to thrombosis of right middle cerebral artery ([place])   . Essential hypertension   . Oropharyngeal dysphagia   . Left hemiparesis ([place])   . Class 1 obesity due to excess calories with serious comorbidity and body mass index (BMI) of 32.0 to 32.9 in adult   . Acute ischemic cerebrovascular accident (CVA) involving right middle cerebral artery territory ([place]) 06/04/2019  . Arterial ischemic stroke, MCA, right, acute [facility]) 06/04/2019   Thank you,  Genene Churn, [place]  [facility] 07/10/2019, 5:56 PM  [place] [address] [place], [place], [address] Phone: [phone]   Fax:  [phone]   Name: JUWAAN SPOLAR MRN: [id number] Date of Birth: 09/18/1960

## 2019-07-11 ENCOUNTER — Other Ambulatory Visit: Payer: Self-pay

## 2019-07-11 ENCOUNTER — Ambulatory Visit (HOSPITAL_COMMUNITY): Payer: Medicaid Other | Admitting: Physical Therapy

## 2019-07-11 ENCOUNTER — Encounter (HOSPITAL_COMMUNITY): Payer: Self-pay | Admitting: Physical Therapy

## 2019-07-11 DIAGNOSIS — R262 Difficulty in walking, not elsewhere classified: Secondary | ICD-10-CM

## 2019-07-11 DIAGNOSIS — R29898 Other symptoms and signs involving the musculoskeletal system: Secondary | ICD-10-CM

## 2019-07-11 DIAGNOSIS — R2689 Other abnormalities of gait and mobility: Secondary | ICD-10-CM | POA: Diagnosis not present

## 2019-07-11 NOTE — Therapy (Signed)
[facility] [address] [place], [place], [address] Phone: [phone]   Fax:  [phone]  Physical Therapy Treatment  Patient Details  Name: Scott Day MRN: [id number] Date of Birth: 10-Sep-1960 Referring Provider (PT): Princella Ion    Encounter Date: 07/11/2019  PT End of Session - 07/11/19 0820    Visit Number  6    Number of Visits  8    Date for PT Re-Evaluation  07/20/19    Authorization Type  Self pay    Progress Note Due on Visit  8    PT Start Time  0817    PT Stop Time  0857    PT Time Calculation (min)  40 min    Equipment Utilized During Treatment  Gait belt    Activity Tolerance  Patient tolerated treatment well    Behavior During Therapy  [facility] for tasks assessed/performed       Past Medical History:  Diagnosis Date  . Cancer [facility]) 2012   colon cancer  . Closed fracture of left distal radius   . GERD (gastroesophageal reflux disease)     Past Surgical History:  Procedure Laterality Date  . BUBBLE STUDY  06/08/2019   Procedure: BUBBLE STUDY;  Surgeon: Buford Dresser, MD;  Location: [facility] ENDOSCOPY;  Service: Cardiovascular;;  . COLON SURGERY  2012   colon cancer  . IR CT HEAD LTD  06/04/2019  . IR CT HEAD LTD  06/04/2019  . IR INTRA CRAN STENT  06/04/2019  . IR PERCUTANEOUS ART THROMBECTOMY/INFUSION INTRACRANIAL INC DIAG ANGIO  06/04/2019  . IR US GUIDE VASC ACCESS RIGHT  06/04/2019  . OPEN REDUCTION INTERNAL FIXATION (ORIF) DISTAL RADIAL FRACTURE Left 09/20/2017   Procedure: OPEN REDUCTION INTERNAL FIXATION (ORIF) DISTAL RADIAL FRACTURE;  Surgeon: Leanora Cover, MD;  Location: [place];  Service: Orthopedics;  Laterality: Left;  . RADIOLOGY WITH ANESTHESIA N/A 06/04/2019   Procedure: IR WITH ANESTHESIA;  Surgeon: Radiologist, Medication, MD;  Location: [place];  Service: Radiology;  Laterality: N/A;  . TEE WITHOUT CARDIOVERSION N/A 06/08/2019   Procedure: TRANSESOPHAGEAL ECHOCARDIOGRAM (TEE);   Surgeon: Buford Dresser, MD;  Location: [facility] ENDOSCOPY;  Service: Cardiovascular;  Laterality: N/A;    There were no vitals filed for this visit.  Subjective Assessment - 07/11/19 0819    Subjective  Patient says he is doing well and reports no new issues since last week. Sasy speech therapy is still difficult.    Limitations  Lifting;Standing;Walking;House hold activities    Patient Stated Goals  Get back to normal    Currently in Pain?  No/denies                            Balance Exercises - 07/11/19 0842      Balance Exercises: Standing   Rockerboard  Anterior/posterior;Lateral;10 reps    Step Ups  Forward;Lateral   10 x each on BOSU   Gait with Head Turns  Forward;3 reps   3 reps horiz; 3 reps vertical    Tandem Gait  Forward;Foam/compliant surface;3 reps    Retro Gait  3 reps    Sidestepping  Foam/compliant support;2 reps    Turning  3 reps    Step Over Hurdles / Cones  stepping over (4) 6 inch hurdles  15' 3RT fwd, 3RT lateral     Other Standing Exercises  sidestepping with GTB 3 RT  PT Short Term Goals - 06/21/19 0859      PT SHORT TERM GOAL #1   Title  Patient will be independent with initial HEP to improve functional outcomes    Time  2    Period  Weeks    Status  New    Target Date  07/06/19        PT Long Term Goals - 06/21/19 1056      PT LONG TERM GOAL #1   Title  Patient will reports no falls since starting therapy showing improved safety awareness and functional outcomes.    Time  4    Period  Weeks    Status  New    Target Date  07/20/19      PT LONG TERM GOAL #2   Title  Patient will feel confident with ambulation and ADLs in/around his home and on uneven/outdoor surfaces to indicate reduced risk of falls and improved funciton loutcomes.    Time  4    Period  Weeks    Status  New    Target Date  07/20/19      PT LONG TERM GOAL #3   Title  Patient will improve DGI by 3 points to indicate reduced falls  risk and improved funcitoal outcomes.    Time  4    Period  Weeks    Status  New    Target Date  07/20/19            Plan - 07/11/19 0901    Clinical Impression Statement  Patient tolerated session well overall today. Patient had some difficulty with stabilizing on BOSU ball especially with LT possibly due to glute med weakness/ activation. Patient showed improvement with tandem gait on balance beam. Patient shows some level of frustration with balance on complaint surface i.e. BOSU ball, but demos appropriate righting strategies when off balance. Added band resisted sidestepping to improve glute Medius strength and single limb stability. Patient educated on proper form and function of added activity.    Examination-Activity Limitations  Locomotion Level;Lift    Examination-Participation Restrictions  Community Activity;Yard Work;Driving    Stability/Clinical Decision Making  Stable/Uncomplicated    Rehab Potential  Good    PT Frequency  2x / week    PT Duration  4 weeks    PT Treatment/Interventions  ADLs/Self Care Home Management;Aquatic Therapy;Biofeedback;Cryotherapy;Electrical Stimulation;Iontophoresis 4mg /ml Dexamethasone;Moist Heat;Balance training;Therapeutic exercise;Manual techniques;Therapeutic activities;Functional mobility training;Stair training;Orthotic Fit/Training;Gait training;Patient/family education;DME Instruction;Neuromuscular re-education;Ultrasound;Parrafin;Visual/perceptual remediation/compensation;Joint Manipulations;Passive range of motion;Dry needling;Energy conservation;Splinting;Taping;Vasopneumatic Device;Vestibular    PT Next Visit Plan  Continue gait training with cognitive challenge. Progress challenge with eyes closed activities.    PT Home Exercise Plan  06/27/19: sit to stand, tandem stance, SLS    Consulted and Agree with Plan of Care  Patient       Patient will benefit from skilled therapeutic intervention in order to improve the following deficits and  impairments:  Abnormal gait, Decreased coordination, Decreased balance, Decreased safety awareness  Visit Diagnosis: Other symptoms and signs involving the musculoskeletal system  Difficulty in walking, not elsewhere classified     Problem List Patient Active Problem List   Diagnosis Date Noted  . Labile blood glucose   . Leukocytosis   . Tobacco abuse   . Dysphagia, post-stroke   . E. coli UTI   . Diabetes mellitus, new onset ([place])   . 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 Cerebrovascular accident (CVA) due to thrombosis of right middle cerebral artery ([place])   .  Essential hypertension   . Oropharyngeal dysphagia   . Left hemiparesis ([place])   . Class 1 obesity due to excess calories with serious comorbidity and body mass index (BMI) of 32.0 to 32.9 in adult   . Acute ischemic cerebrovascular accident (CVA) involving right middle cerebral artery territory ([place]) 06/04/2019  . Arterial ischemic stroke, MCA, right, acute ([place]) 06/04/2019    9:02 AM, 07/11/19 Josue Hector PT DPT  Physical Therapist with [facility]  [phone] [place], [place], [address] Phone: [phone]   Fax:  [phone]  Name: Scott Day MRN: [id number] Date of Birth: [birth year]

## 2019-07-12 ENCOUNTER — Encounter (HOSPITAL_COMMUNITY): Payer: Self-pay | Admitting: Physical Therapy

## 2019-07-12 ENCOUNTER — Ambulatory Visit (HOSPITAL_COMMUNITY): Payer: Medicaid Other | Admitting: Physical Therapy

## 2019-07-12 DIAGNOSIS — R2689 Other abnormalities of gait and mobility: Secondary | ICD-10-CM | POA: Diagnosis not present

## 2019-07-12 DIAGNOSIS — R29898 Other symptoms and signs involving the musculoskeletal system: Secondary | ICD-10-CM

## 2019-07-12 DIAGNOSIS — R262 Difficulty in walking, not elsewhere classified: Secondary | ICD-10-CM

## 2019-07-12 NOTE — Patient Instructions (Signed)
Access Code: [email or web address] URL: [email or web address] Date: 07/12/2019 Prepared by: Josue Hector  Exercises Foam Balance Tandem stance - 1 x daily - 5 x weekly - 1 sets - 3 reps - 30 sec hold Single Leg Stance on Foam Pad - 1 x daily - 5 x weekly - 1 sets - 3 reps - 30 sec hold Side Stepping with Resistance at Ankles - 1 x daily - 3 x weekly - 2 sets - 10 reps Step Up - 1 x daily - 3 x weekly - 2 sets - 10 reps Lateral Step Ups - 1 x daily - 3 x weekly - 2 sets - 10 reps

## 2019-07-12 NOTE — Therapy (Signed)
[place], [place], [address] Phone: [phone]   Fax:  [phone]  Physical Therapy Treatment/ Discharge Summary  Patient Details  Name: Scott Day MRN: [id number] Date of Birth: Apr 06, 1960 Referring Provider (PT): Princella Ion    Encounter Date: 07/12/2019   PHYSICAL THERAPY DISCHARGE SUMMARY  Visits from Start of Care: 7  Current functional level related to goals / functional outcomes: See below   Remaining deficits: See below   Education / Equipment: See assessment   Plan: Patient agrees to discharge.  Patient goals were met. Patient is being discharged due to meeting the stated rehab goals.  ?????       PT End of Session - 07/12/19 0817    Visit Number  7    Number of Visits  8    Date for PT Re-Evaluation  07/20/19    Authorization Type  Self pay    Progress Note Due on Visit  8    PT Start Time  0813    PT Stop Time  0843    PT Time Calculation (min)  30 min    Equipment Utilized During Treatment  Gait belt    Activity Tolerance  Patient tolerated treatment well    Behavior During Therapy  WFL for tasks assessed/performed       Past Medical History:  Diagnosis Date  . Cancer [facility]) 2012   colon cancer  . Closed fracture of left distal radius   . GERD (gastroesophageal reflux disease)     Past Surgical History:  Procedure Laterality Date  . BUBBLE STUDY  06/08/2019   Procedure: BUBBLE STUDY;  Surgeon: Buford Dresser, MD;  Location: [facility] ENDOSCOPY;  Service: Cardiovascular;;  . COLON SURGERY  2012   colon cancer  . IR CT HEAD LTD  06/04/2019  . IR CT HEAD LTD  06/04/2019  . IR INTRA CRAN STENT  06/04/2019  . IR PERCUTANEOUS ART THROMBECTOMY/INFUSION INTRACRANIAL INC DIAG ANGIO  06/04/2019  . IR US GUIDE VASC ACCESS RIGHT  06/04/2019  . OPEN REDUCTION INTERNAL FIXATION (ORIF) DISTAL RADIAL FRACTURE Left 09/20/2017   Procedure: OPEN REDUCTION INTERNAL FIXATION (ORIF) DISTAL  RADIAL FRACTURE;  Surgeon: Leanora Cover, MD;  Location: [place];  Service: Orthopedics;  Laterality: Left;  . RADIOLOGY WITH ANESTHESIA N/A 06/04/2019   Procedure: IR WITH ANESTHESIA;  Surgeon: Radiologist, Medication, MD;  Location: [place];  Service: Radiology;  Laterality: N/A;  . TEE WITHOUT CARDIOVERSION N/A 06/08/2019   Procedure: TRANSESOPHAGEAL ECHOCARDIOGRAM (TEE);  Surgeon: Buford Dresser, MD;  Location: [facility] ENDOSCOPY;  Service: Cardiovascular;  Laterality: N/A;    There were no vitals filed for this visit.  Subjective Assessment - 07/12/19 0816    Subjective  Patient says he is doing very well, reports no new issues and says he feels he is ready for DC from therapy.    Limitations  Lifting;Standing;Walking;House hold activities    Patient Stated Goals  Get back to normal    Currently in Pain?  No/denies         [facility] PT Assessment - 07/12/19 0001      Assessment   Medical Diagnosis  s/p right CVA    Referring Provider (PT)  Princella Ion     Onset Date/Surgical Date  06/04/19    Next MD Visit  unsure of date    Prior Therapy  CIR at [facility]      Precautions   Precautions  None  Restrictions   Weight Bearing Restrictions  No      Balance Screen   Has the patient fallen in the past 6 months  No      [place] residence      Prior Function   Level of Independence  Independent      Cognition   Overall Cognitive Status  Within Functional Limits for tasks assessed      Dynamic Gait Index   Level Surface  Normal    Change in Gait Speed  Normal    Gait with Horizontal Head Turns  Normal    Gait with Vertical Head Turns  Normal    Gait and Pivot Turn  Normal    Step Over Obstacle  Normal    Step Around Obstacles  Normal    Steps  Normal    Total Score  24                        Balance Exercises - 07/12/19 0841      Balance Exercises: Standing   Step Ups  Forward;Lateral   5  reps each on BOSU   Tandem Gait  Forward;Foam/compliant surface;3 reps    Sidestepping  Foam/compliant support;3 reps    Other Standing Exercises  sidestepping with GTB 3 RT        PT Education - 07/12/19 0816    Education Details  on reassessment findings, DC and HEP    Person(s) Educated  Patient    Methods  Explanation;Handout    Comprehension  Verbalized understanding       PT Short Term Goals - 07/12/19 0855      PT SHORT TERM GOAL #1   Title  Patient will be independent with initial HEP to improve functional outcomes    Time  2    Period  Weeks    Status  Achieved    Target Date  07/06/19        PT Long Term Goals - 07/12/19 0855      PT LONG TERM GOAL #1   Title  Patient will reports no falls since starting therapy showing improved safety awareness and functional outcomes.    Time  4    Period  Weeks    Status  Achieved      PT LONG TERM GOAL #2   Title  Patient will feel confident with ambulation and ADLs in/around his home and on uneven/outdoor surfaces to indicate reduced risk of falls and improved funciton loutcomes.    Time  4    Period  Weeks    Status  Achieved      PT LONG TERM GOAL #3   Title  Patient will improve DGI by 3 points to indicate reduced falls risk and improved funcitoal outcomes.    Time  4    Period  Weeks    Status  Achieved            Plan - 07/12/19 0856    Clinical Impression Statement  Patient demos very good progress and has currently met all short- and long-term therapy goals. Patient shows improved static and dynamic balance, as well as gait with executive tasks. Patient shows improved safety awareness and reports no fall since starting therapy. Patient being DC today with all therapy goals met. Patient educated on and issued update HEP handout. Patient instructed to follow up with therapy services with any further questions or concerns.  Examination-Activity Limitations  Locomotion Level;Lift     Examination-Participation Restrictions  Community Activity;Yard Work;Driving    Stability/Clinical Decision Making  Stable/Uncomplicated    Rehab Potential  Good    PT Treatment/Interventions  ADLs/Self Care Home Management;Aquatic Therapy;Biofeedback;Cryotherapy;Electrical Stimulation;Iontophoresis 37m/ml Dexamethasone;Moist Heat;Balance training;Therapeutic exercise;Manual techniques;Therapeutic activities;Functional mobility training;Stair training;Orthotic Fit/Training;Gait training;Patient/family education;DME Instruction;Neuromuscular re-education;Ultrasound;Parrafin;Visual/perceptual remediation/compensation;Joint Manipulations;Passive range of motion;Dry needling;Energy conservation;Splinting;Taping;Vasopneumatic Device;Vestibular    PT Next Visit Plan  DC    PT Home Exercise Plan  06/27/19: sit to stand, tandem stance, SLS    Consulted and Agree with Plan of Care  Patient       Patient will benefit from skilled therapeutic intervention in order to improve the following deficits and impairments:  Abnormal gait, Decreased coordination, Decreased balance, Decreased safety awareness  Visit Diagnosis: Other symptoms and signs involving the musculoskeletal system  Difficulty in walking, not elsewhere classified     Problem List Patient Active Problem List   Diagnosis Date Noted  . Labile blood glucose   . Leukocytosis   . Tobacco abuse   . Dysphagia, post-stroke   . E. coli UTI   . Diabetes mellitus, new onset ([place]   . Right middle cerebral artery stroke ([place] 06/09/2019  . Cerebrovascular accident (CVA) due to thrombosis of right middle cerebral artery ([place]   . Essential hypertension   . Oropharyngeal dysphagia   . Left hemiparesis ([place]   . Class 1 obesity due to excess calories with serious comorbidity and body mass index (BMI) of 32.0 to 32.9 in adult   . Acute ischemic cerebrovascular accident (CVA) involving right middle cerebral artery territory ([place] 06/04/2019  .  Arterial ischemic stroke, MCA, right, acute ([place] 06/04/2019    8:57 AM, 07/12/19 CJosue HectorPT DPT  [facility] [phone] [address]Phone: [phone]  Fax:  [phone] Name: Scott STONEBRAKERMRN: [id number] of Birth: [birth year]

## 2019-07-16 ENCOUNTER — Encounter (HOSPITAL_COMMUNITY): Payer: Self-pay | Admitting: Physical Therapy

## 2019-07-17 ENCOUNTER — Encounter (HOSPITAL_COMMUNITY): Payer: Self-pay | Admitting: Speech Pathology

## 2019-07-17 ENCOUNTER — Ambulatory Visit (HOSPITAL_COMMUNITY): Payer: Medicaid Other | Admitting: Speech Pathology

## 2019-07-17 ENCOUNTER — Other Ambulatory Visit: Payer: Self-pay

## 2019-07-17 ENCOUNTER — Encounter (HOSPITAL_COMMUNITY): Payer: Self-pay | Admitting: Physical Therapy

## 2019-07-17 DIAGNOSIS — R2689 Other abnormalities of gait and mobility: Secondary | ICD-10-CM | POA: Diagnosis not present

## 2019-07-17 DIAGNOSIS — R41841 Cognitive communication deficit: Secondary | ICD-10-CM

## 2019-07-17 NOTE — Therapy (Signed)
[place], [place], [address] Phone: [phone]   Fax:  [phone]  Speech Language Pathology Treatment  Patient Details  Name: Scott Day MRN: [id number] Date of Birth: 06/22/1960 Referring Provider (SLP): Lauraine Rinne, PA-C   Encounter Date: 07/17/2019  End of Session - 07/17/19 1624    Visit Number  4    Number of Visits  5    Date for SLP Re-Evaluation  07/26/19    Authorization Type  Medicaid pending    SLP Start Time  1518    SLP Stop Time   1600    SLP Time Calculation (min)  42 min    Activity Tolerance  Patient tolerated treatment well       Past Medical History:  Diagnosis Date  . Cancer [facility]) 2012   colon cancer  . Closed fracture of left distal radius   . GERD (gastroesophageal reflux disease)     Past Surgical History:  Procedure Laterality Date  . BUBBLE STUDY  06/08/2019   Procedure: BUBBLE STUDY;  Surgeon: Buford Dresser, MD;  Location: [facility] ENDOSCOPY;  Service: Cardiovascular;;  . COLON SURGERY  2012   colon cancer  . IR CT HEAD LTD  06/04/2019  . IR CT HEAD LTD  06/04/2019  . IR INTRA CRAN STENT  06/04/2019  . IR PERCUTANEOUS ART THROMBECTOMY/INFUSION INTRACRANIAL INC DIAG ANGIO  06/04/2019  . IR US GUIDE VASC ACCESS RIGHT  06/04/2019  . OPEN REDUCTION INTERNAL FIXATION (ORIF) DISTAL RADIAL FRACTURE Left 09/20/2017   Procedure: OPEN REDUCTION INTERNAL FIXATION (ORIF) DISTAL RADIAL FRACTURE;  Surgeon: Leanora Cover, MD;  Location: [place];  Service: Orthopedics;  Laterality: Left;  . RADIOLOGY WITH ANESTHESIA N/A 06/04/2019   Procedure: IR WITH ANESTHESIA;  Surgeon: Radiologist, Medication, MD;  Location: [place];  Service: Radiology;  Laterality: N/A;  . TEE WITHOUT CARDIOVERSION N/A 06/08/2019   Procedure: TRANSESOPHAGEAL ECHOCARDIOGRAM (TEE);  Surgeon: Buford Dresser, MD;  Location: [facility] ENDOSCOPY;  Service: Cardiovascular;  Laterality: N/A;    There  were no vitals filed for this visit.  Subjective Assessment - 07/17/19 1620    Subjective  "I think I need my vision checked."    Patient is accompained by:  Family member    Currently in Pain?  No/denies        ADULT SLP TREATMENT - 07/17/19 1621      General Information   Behavior/Cognition  Alert;Cooperative;Pleasant mood    Patient Positioning  Upright in chair    Oral care provided  N/A    HPI  Scott Day is a 59 y.o. right-handed male with history of colon cancer 2012, GERD, tobacco abuse.  Per chart review lives alone independent prior to admission.  Presented 06/04/2019 with left-sided weakness and slurred speech. Cranial CT scan negative for acute changes.  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  Patient did not receive TPA.  Underwent revascularization stent placement per interventional radiology.  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  He was admitted to inpatient rehab from 3/20-3/26/21 for PT, OT, and SLP and discharged home with supervision from family. He was referred for OP SLP evaluation and treatment.       Treatment Provided   Treatment provided  Cognitive-Linquistic      Pain Assessment   Pain Assessment  No/denies pain      Cognitive-Linquistic Treatment   Treatment  focused on  Cognition;Patient/family/caregiver education    Skilled Treatment  visual scanning, planning, hypothetical problem solving, self evaluation, Pt/ caregiver education      Assessment / Recommendations / Plan   Plan  Continue with current plan of care      Progression Toward Goals   Progression toward goals  Progressing toward goals       SLP Education - 07/17/19 1623    Education Details  discussed discharge from therapy, stop and think strategy at home, obtain eye exam    Person(s) Educated  Patient;Child(ren)    Methods  Explanation;Handout    Comprehension  Verbalized  understanding       SLP Short Term Goals - 07/17/19 1625      SLP SHORT TERM GOAL #1   Title  Pt will utilize the "stop and think" strategy during moderately complex planning tasks with indirect cues on 9 of 10 trials.    Baseline  Impulsivity noted with planning tasks and verbal cues to wait before attempting to complete.    Time  4    Period  Weeks    Status  Achieved    Target Date  07/26/19      SLP SHORT TERM GOAL #2   Title  Pt will verbalize steps to complete moderately complex functional tasks involving 5+ steps with 90% acc with min assist.    Baseline  85%    Time  4    Period  Weeks    Status  Partially Met    Target Date  07/26/19      SLP SHORT TERM GOAL #3   Title  Pt will complete moderately complex planning task with 90% acc with min assist.    Baseline  85%    Time  4    Period  Weeks    Status  Achieved    Target Date  07/26/19       SLP Long Term Goals - 07/10/19 1755      SLP LONG TERM GOAL #1   Title  Same as short       Plan - 07/17/19 1625    Clinical Impression Statement  Pt was accompanied to therapy by his daughter. Both Pt and his daughter indicate that he seems to be functioning well at home in regards to completing tasks he typically does (feeding chickens, fishing, etc). He still becomes frustrated and loses his temper more quickly than before. Pt and daughter identified situations which increase frustration at home (being out for too long while running errands with his friend). SLP discussed strategies to circumvent frustration which include: writing down daily plan on a dry erase board, using written reminders, establishing "out" time before leaving the house, and bringing along activities he can do when he is bored. He appears most frustrated about not being able to drive. SLP recommended that Pt have a vision check. He is not interested in coming to therapy because he does find role playing scenarios and planning tasks to be effective. Today,  he successfully measured, hung, and balanced two pictures in the treatment room with one verbal cue to "stop and verbalize" plan before jumping into task. SLP encouraged Pt to adopt the mantra, "measure twice, cut once" throughout all activities at home. Pt made good progress toward goals, however continues to present with mild cognitive deficits (attention, impulsivity, and planning). Pt and daughter acknowledge the same and will continue to work on activities at home in a safe environment. Will d/c from  SLP services at this time.     Speech Therapy Frequency  1x /week    Duration  4 weeks    Treatment/Interventions  Compensatory strategies;Patient/family education;SLP instruction and feedback;Internal/external aids;Compensatory techniques;Cognitive reorganization    Potential to Achieve Goals  Good    SLP Home Exercise Plan  Pt will complete HEP as assigned to facilitate carryover of treatment strategies in home environment with written cues.    Consulted and Agree with Plan of Care  Patient       Patient will benefit from skilled therapeutic intervention in order to improve the following deficits and impairments:   Cognitive communication deficit    Problem List Patient Active Problem List   Diagnosis Date Noted  . Labile blood glucose   . Leukocytosis   . Tobacco abuse   . Dysphagia, post-stroke   . E. coli UTI   . Diabetes mellitus, new onset ([place])   . 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 Cerebrovascular accident (CVA) due to thrombosis of right middle cerebral artery ([place])   . Essential hypertension   . Oropharyngeal dysphagia   . Left hemiparesis ([place])   . Class 1 obesity due to excess calories with serious comorbidity and body mass index (BMI) of 32.0 to 32.9 in adult   . Acute ischemic cerebrovascular accident (CVA) involving right middle cerebral artery territory ([place]) 06/04/2019  . Arterial ischemic stroke, MCA, right, acute ([place]) 06/04/2019   SPEECH  THERAPY DISCHARGE SUMMARY  Visits from Start of Care: 4  Current functional level related to goals / functional outcomes: Good progress toward goals, partially met and fully met above   Remaining deficits: Mild cognitive deficits (attention, impulsivity, and planning)   Education / Equipment: Use of written prompts at home  Plan: Patient agrees to discharge.  Patient goals were partially met. Patient is being discharged due to being pleased with the current functional level.  ?????         Thank you,  Genene Churn, [place]  [facility] 07/17/2019, 4:26 PM  [place] [address] [place], [place], [address] Phone: [phone]   Fax:  [phone]   Name: Scott Day MRN: [id number] Date of Birth: May 05, 1960

## 2019-07-18 ENCOUNTER — Ambulatory Visit (HOSPITAL_COMMUNITY): Payer: Medicaid Other | Admitting: Physical Therapy

## 2019-07-19 ENCOUNTER — Ambulatory Visit (HOSPITAL_COMMUNITY): Payer: Medicaid Other | Admitting: Physical Therapy

## 2019-07-23 ENCOUNTER — Ambulatory Visit: Payer: MEDICAID | Admitting: Adult Health

## 2019-07-23 ENCOUNTER — Ambulatory Visit (HOSPITAL_COMMUNITY): Payer: Medicaid Other | Admitting: Speech Pathology

## 2019-07-23 ENCOUNTER — Other Ambulatory Visit: Payer: Self-pay

## 2019-07-23 ENCOUNTER — Encounter: Payer: Self-pay | Admitting: Adult Health

## 2019-07-23 VITALS — BP 140/70 | Temp 98.1°F | Ht 69.0 in | Wt 224.0 lb

## 2019-07-23 DIAGNOSIS — E785 Hyperlipidemia, unspecified: Secondary | ICD-10-CM

## 2019-07-23 DIAGNOSIS — E119 Type 2 diabetes mellitus without complications: Secondary | ICD-10-CM

## 2019-07-23 DIAGNOSIS — I10 Essential (primary) hypertension: Secondary | ICD-10-CM

## 2019-07-23 DIAGNOSIS — I639 Cerebral infarction, unspecified: Secondary | ICD-10-CM

## 2019-07-23 MED ORDER — SERTRALINE HCL 25 MG PO TABS
25.0000 mg | ORAL_TABLET | Freq: Every day | ORAL | 12 refills | Status: DC
Start: 2019-07-23 — End: 2019-08-21

## 2019-07-23 MED ORDER — ROSUVASTATIN CALCIUM 20 MG PO TABS: 20.0000 mg | ORAL_TABLET | Freq: Every day | ORAL | 12 refills | Status: DC

## 2019-07-23 NOTE — Patient Instructions (Signed)
Start sertaline 25mg  nightly to help with increased anxiety post stroke  Continue aspirin 325 mg daily and Brilinta (ticagrelor) 90 mg bid  and start crestor 20mg  daily  for secondary stroke prevention  Follow up with cardiology regarding loop recorder placement once insurance approved  Obtain lab work once insurance approved - call office and orders will be placed  Follow up with IR in June for monitoring of stent placement  Continue to follow up with PCP regarding cholesterol, blood pressure and diabetes management   Maintain strict control of hypertension with blood pressure goal below 130/90, diabetes with hemoglobin A1c goal below 6.5% and cholesterol with LDL cholesterol (bad cholesterol) goal below 70 mg/dL. I also advised the patient to eat a healthy diet with plenty of whole grains, cereals, fruits and vegetables, exercise regularly and maintain ideal body weight.  Followup in the future with me in 3 months or call earlier if needed       Thank you for coming to see [place] at [facility]. I hope we have been able to provide you high quality care today.  You may receive a patient satisfaction survey over the next few weeks. We would appreciate your feedback and comments so that we may continue to improve ourselves and the health of our patients.   Sertraline tablets What is this medicine? SERTRALINE (SER tra leen) is used to treat depression. It may also be used to treat obsessive compulsive disorder, panic disorder, post-trauma stress, premenstrual dysphoric disorder (PMDD) or social anxiety. This medicine may be used for other purposes; ask your health care provider or pharmacist if you have questions. COMMON BRAND NAME(S): Zoloft What should I tell my health care provider before I take this medicine? They need to know if you have any of these conditions:  bleeding disorders  bipolar disorder or a family history of bipolar disorder  glaucoma  heart  disease  high blood pressure  history of irregular heartbeat  history of low levels of calcium, magnesium, or potassium in the blood  if you often drink alcohol  liver disease  receiving electroconvulsive therapy  seizures  suicidal thoughts, plans, or attempt; a previous suicide attempt by you or a family member  take medicines that treat or prevent blood clots  thyroid disease  an unusual or allergic reaction to sertraline, other medicines, foods, dyes, or preservatives  pregnant or trying to get pregnant  breast-feeding How should I use this medicine? Take this medicine by mouth with a glass of water. Follow the directions on the prescription label. You can take it with or without food. Take your medicine at regular intervals. Do not take your medicine more often than directed. Do not stop taking this medicine suddenly except upon the advice of your doctor. Stopping this medicine too quickly may cause serious side effects or your condition may worsen. A special MedGuide will be given to you by the pharmacist with each prescription and refill. Be sure to read this information carefully each time. Talk to your pediatrician regarding the use of this medicine in children. While this drug may be prescribed for children as young as 7 years for selected conditions, precautions do apply. Overdosage: If you think you have taken too much of this medicine contact a poison control center or emergency room at once. NOTE: This medicine is only for you. Do not share this medicine with others. What if I miss a dose? If you miss a dose, take it as soon as you can.  If it is almost time for your next dose, take only that dose. Do not take double or extra doses. What may interact with this medicine? Do not take this medicine with any of the following medications:  cisapride  dronedarone  linezolid  MAOIs like Carbex, Eldepryl, Marplan, Nardil, and Parnate  methylene blue (injected into a  vein)  pimozide  thioridazine This medicine may also interact with the following medications:  alcohol  amphetamines  aspirin and aspirin-like medicines  certain medicines for depression, anxiety, or psychotic disturbances  certain medicines for fungal infections like ketoconazole, fluconazole, posaconazole, and itraconazole  certain medicines for irregular heart beat like flecainide, quinidine, propafenone  certain medicines for migraine headaches like almotriptan, eletriptan, frovatriptan, naratriptan, rizatriptan, sumatriptan, zolmitriptan  certain medicines for sleep  certain medicines for seizures like carbamazepine, valproic acid, phenytoin  certain medicines that treat or prevent blood clots like warfarin, enoxaparin, dalteparin  cimetidine  digoxin  diuretics  fentanyl  isoniazid  lithium  NSAIDs, medicines for pain and inflammation, like ibuprofen or naproxen  other medicines that prolong the QT interval (cause an abnormal heart rhythm) like dofetilide  rasagiline  safinamide  supplements like St. John's wort, kava kava, valerian  tolbutamide  tramadol  tryptophan This list may not describe all possible interactions. Give your health care provider a list of all the medicines, herbs, non-prescription drugs, or dietary supplements you use. Also tell them if you smoke, drink alcohol, or use illegal drugs. Some items may interact with your medicine. What should I watch for while using this medicine? Tell your doctor if your symptoms do not get better or if they get worse. Visit your doctor or health care professional for regular checks on your progress. Because it may take several weeks to see the full effects of this medicine, it is important to continue your treatment as prescribed by your doctor. Patients and their families should watch out for new or worsening thoughts of suicide or depression. Also watch out for sudden changes in feelings such as  feeling anxious, agitated, panicky, irritable, hostile, aggressive, impulsive, severely restless, overly excited and hyperactive, or not being able to sleep. If this happens, especially at the beginning of treatment or after a change in dose, call your health care professional. Dennis Bast may get drowsy or dizzy. Do not drive, use machinery, or do anything that needs mental alertness until you know how this medicine affects you. Do not stand or sit up quickly, especially if you are an older patient. This reduces the risk of dizzy or fainting spells. Alcohol may interfere with the effect of this medicine. Avoid alcoholic drinks. Your mouth may get dry. Chewing sugarless gum or sucking hard candy, and drinking plenty of water may help. Contact your doctor if the problem does not go away or is severe. What side effects may I notice from receiving this medicine? Side effects that you should report to your doctor or health care professional as soon as possible:  allergic reactions like skin rash, itching or hives, swelling of the face, lips, or tongue  anxious  black, tarry stools  changes in vision  confusion  elevated mood, decreased need for sleep, racing thoughts, impulsive behavior  eye pain  fast, irregular heartbeat  feeling faint or lightheaded, falls  feeling agitated, angry, or irritable  hallucination, loss of contact with reality  loss of balance or coordination  loss of memory  painful or prolonged erections  restlessness, pacing, inability to keep still  seizures  stiff  muscles  suicidal thoughts or other mood changes  trouble sleeping  unusual bleeding or bruising  unusually weak or tired  vomiting Side effects that usually do not require medical attention (report to your doctor or health care professional if they continue or are bothersome):  change in appetite or weight  change in sex drive or performance  diarrhea  increased sweating  indigestion,  nausea  tremors This list may not describe all possible side effects. Call your doctor for medical advice about side effects. You may report side effects to FDA at 1-800-FDA-1088. Where should I keep my medicine? Keep out of the reach of children. Store at room temperature between 15 and 30 degrees C (59 and 86 degrees F). Throw away any unused medicine after the expiration date. NOTE: This sheet is a summary. It may not cover all possible information. If you have questions about this medicine, talk to your doctor, pharmacist, or health care provider.  2020 Elsevier/Gold Standard (2018-02-28 10:09:27)

## 2019-07-23 NOTE — Progress Notes (Signed)
[facility] [address] [place]. [place] [address] [phone]       HOSPITAL FOLLOW UP NOTE  [name] Date of Birth:  [birth year] Medical Record Number:  [id number]   Reason for Referral:  hospital stroke follow up    SUBJECTIVE:   CHIEF COMPLAINT:  Chief Complaint  Patient presents with  . Follow-up    Treatment RM. here for a hops f/u from a stroke    HPI:   Scott E Barleyis a 59 y.o.malewith history of colon cancer in 2012 presented to [facility] on 06/04/2019 with L sided weakness.  CTA w/ R M1 occlusion w/ penumbra. Transferred to [facility] for IR.  Evaluated by stroke team with stroke work-up revealing right MCA territory infarct with right M1 distal occlusion s/p IR with right M1 stent placement, embolic secondary to unknown source.  Initially, IR mechanical thrombectomy with TICI 2C revascularization and dissection flap at site of occlusion with cerebral angio with reocclusion therefore right M1 stent placed with TICI 3 flow and advised to follow-up with IR outpatient.  Recommended consideration of loop recorder placement outpatient due to lack of insurance.  Also recommended pursuing hypercoagulable labs outpatient.  Discharged on aspirin 325 mg daily and Brilinta 90 mg twice daily for secondary stroke prevention and stent placement.  No history of HTN.  LDL 126 initiate atorvastatin 80 mg daily.  New diagnosis of DM with A1c 8.3 and recommended PCP follow-up outpatient.  Other stroke risk factors include EtOH use and obesity but no prior history of stroke.  Evaluated by therapies with residual deficits of mild dysarthria, right gaze preference, left hemianopsia versus neglect, left facial droop and dysphagia and recommended discharge to CIR for ongoing therapy needs.  Stroke: R MCA territory infarct with right M1 distal occlusion s/p IR with R M1 stent placement, embolic secondary to unknown  source  Code Stroke CT head No acute abnormality. ASPECTS 10.   CTA head &neck nonocclusive distal R M1 thrombus into bifurcation. Neck ok  CT perfusion 25ml core, 146ml territory at risk  IR mid/distal R M1 occlusion.Mechanical thrombectomy w/ TICI2c revascularization. Dissection flap at site of occlusion. Cerebral angio w/ reocclusion. R M1 stent placed w/ TICI3 flow. Treated w/ Integrilin gtt.   Post IR CT no hemorrhage. pathcy cortical/subcortical R MCA infarct (frontal operculum, insula, temporoparietal jxn). Sinus dz.   Not able to have MRI due to left ankle brace  CT head repeat 3/16 no ICH  LE Dopplerno DVT  2D EchoEF 65-70%. No source of embolus   TEE completed, no PFO, EF 65-70%  Loop will be considered as outpt as pt is uninsured at this time.   Hypercoagulable labs: out pt f/u  UDS neg  LDL126  HgbA1c8.3  SCDs for VTE prophylaxis  No antithromboticprior to admission, now on aspirin 325 mg daily and Brilinta (ticagrelor) 90 mg bidfollowing Brilinta load. Continue on discharge  Therapy recommendations: CIR  Today, 07/23/2019, Scott Day is being seen for hospital follow-up accompanied by his daughter.  Residual deficits mild dysphagia, visual impairment, cognitive impairment, and left sided neglect.  Daughter also endorses mood changes such as increased agitation, lack of patience and short temper.  No prior history of depression or anxiety.  Due to lack of insurance, outpatient therapies currently on hold but continues to do HEP with ongoing improvement.   Follow-up with cardiology with plans on pursuing loop recorder placement once insurance is confirmed as currently Medicaid pending.  Continues on aspirin 325 mg daily and Brilinta 90 mg twice daily without bleeding or bruising.  Scheduled follow-up with IR 09/04/2019 for surveillance monitoring of right M1 stent.  Self discontinued atorvastatin due to myalgias with resolution after discontinuing.  Blood  pressure today 140/70.  Has scheduled appointment with community health and wellness in the near future to establish care.  Hypercoagulable labs showed slightly elevated homocysteine level and positive ANA with negative lupus anticoagulant panel, beta-2 glycoprotein and cardiolipin antibodies.  No further concerns at this time.      ROS:   14 system review of systems performed and negative with exception of depression, anxiety, visual impairment and cognitive impairment  PMH:  Past Medical History:  Diagnosis Date  . Cancer [facility] 2012   colon cancer  . Closed fracture of left distal radius   . GERD (gastroesophageal reflux disease)     PSH:  Past Surgical History:  Procedure Laterality Date  . BUBBLE STUDY  06/08/2019   Procedure: BUBBLE STUDY;  Surgeon: Buford Dresser, MD;  Location: [facility] ENDOSCOPY;  Service: Cardiovascular;;  . COLON SURGERY  2012   colon cancer  . IR CT HEAD LTD  06/04/2019  . IR CT HEAD LTD  06/04/2019  . IR INTRA CRAN STENT  06/04/2019  . IR PERCUTANEOUS ART THROMBECTOMY/INFUSION INTRACRANIAL INC DIAG ANGIO  06/04/2019  . IR US GUIDE VASC ACCESS RIGHT  06/04/2019  . OPEN REDUCTION INTERNAL FIXATION (ORIF) DISTAL RADIAL FRACTURE Left 09/20/2017   Procedure: OPEN REDUCTION INTERNAL FIXATION (ORIF) DISTAL RADIAL FRACTURE;  Surgeon: Leanora Cover, MD;  Location: [place];  Service: Orthopedics;  Laterality: Left;  . RADIOLOGY WITH ANESTHESIA N/A 06/04/2019   Procedure: IR WITH ANESTHESIA;  Surgeon: Radiologist, Medication, MD;  Location: [place];  Service: Radiology;  Laterality: N/A;  . TEE WITHOUT CARDIOVERSION N/A 06/08/2019   Procedure: TRANSESOPHAGEAL ECHOCARDIOGRAM (TEE);  Surgeon: Buford Dresser, MD;  Location: [facility] ENDOSCOPY;  Service: Cardiovascular;  Laterality: N/A;    Social History:  Social History   Socioeconomic History  . Marital status: Divorced    Spouse name: Not on file  . Number of children: Not on file  . Years of  education: Not on file  . Highest education level: Not on file  Occupational History  . Not on file  Tobacco Use  . Smoking status: Former Research scientist (life sciences)  . Smokeless tobacco: Never Used  Substance and Sexual Activity  . Alcohol use: Yes    Alcohol/week: 24.0 standard drinks    Types: 24 Cans of beer per week    Comment: 12 to 24 beers daily  . Drug use: Never  . Sexual activity: Not on file  Other Topics Concern  . Not on file  Social History Narrative  . Not on file   Social Determinants of Health   Financial Resource Strain:   . Difficulty of Paying Living Expenses:   Food Insecurity:   . Worried About Charity fundraiser in the Last Year:   . Arboriculturist in the Last Year:   Transportation Needs:   . Film/video editor (Medical):   Marland Kitchen Lack of Transportation (Non-Medical):   Physical Activity:   . Days of Exercise per Week:   . Minutes of Exercise per Session:   Stress:   . Feeling of Stress :   Social Connections:   . Frequency of Communication with Friends and Family:   . Frequency of Social Gatherings with Friends and Family:   . Attends  Religious Services:   . Active Member of Clubs or Organizations:   . Attends Archivist Meetings:   Marland Kitchen Marital Status:   Intimate Partner Violence:   . Fear of Current or Ex-Partner:   . Emotionally Abused:   Marland Kitchen Physically Abused:   . Sexually Abused:     Family History: No family history on file.  Medications:   Current Outpatient Medications on File Prior to Visit  Medication Sig Dispense Refill  . acetaminophen (TYLENOL) 325 MG tablet Take 2 tablets (650 mg total) by mouth every 4 (four) hours as needed for mild pain (or temp > 37.5 C (99.5 F)).    Marland Kitchen aspirin 81 MG chewable tablet Chew 1 tablet (81 mg total) by mouth daily.    . metFORMIN (GLUCOPHAGE) 500 MG tablet Take 0.5 tablets (250 mg total) by mouth daily with breakfast. 30 tablet 0  . ticagrelor (BRILINTA) 90 MG TABS tablet Take 1 tablet (90 mg total) by  mouth 2 (two) times daily. 60 tablet 1  . atorvastatin (LIPITOR) 80 MG tablet Take 1 tablet (80 mg total) by mouth daily at 6 PM. (Patient not taking: Reported on 07/23/2019) 30 tablet 0   No current facility-administered medications on file prior to visit.    Allergies:  No Known Allergies    OBJECTIVE:  Physical Exam  Vitals:   07/23/19 1516  BP: 140/70  Temp: 98.1 F (36.7 C)  Weight: 224 lb (101.6 kg)  Height: 5\' 9"  (1.753 m)   Body mass index is 33.08 kg/m. No exam data present  General: well developed, well nourished,  pleasant middle-age Caucasian male, seated, in no evident distress Head: head normocephalic and atraumatic.   Neck: supple with no carotid or supraclavicular bruits Cardiovascular: regular rate and rhythm, no murmurs Musculoskeletal: no deformity Skin:  no rash/petichiae Vascular:  Normal pulses all extremities   Neurologic Exam Mental Status: Awake and fully alert. Mild dysarthria. Oriented to place and time. Recent and remote memory intact. Attention span, concentration and fund of knowledge mostly appropriate during visit but daughter did have to some information. Mood and affect appropriate.  Cranial Nerves: Fundoscopic exam reveals sharp disc margins. Pupils equal, briskly reactive to light. Extraocular movements full without nystagmus. Visual fields full to confrontation. Hearing intact. Facial sensation intact. Left lower facial weakness Motor: Normal bulk and tone. Normal strength in all tested extremity muscles. Sensory.: intact to touch , pinprick , position and vibratory sensation.  Coordination: Rapid alternating movements normal in all extremities. Finger-to-nose and heel-to-shin performed accurately bilaterally. Gait and Station: Arises from chair without difficulty. Stance is normal. Gait demonstrates normal stride length and balance without use of assistive device Reflexes: 1+ and symmetric. Toes downgoing.     NIHSS  2 Modified Rankin   2      ASSESSMENT: Scott Day is a 59 y.o. year old male presented with left-sided weakness on 06/04/2019 with stroke work-up revealing right MCA territory infarct with right M1 distal occlusion s/p IR with right M1 stent placement, embolic secondary to unknown source.  Recommended consideration of loop recorder placement and hypercoagulable labs outpatient.  Vascular risk factors include HLD, new dx DM, former tobacco use and EtOH use.  Residual deficits of subjective visual impairment, mild dysarthria, cognitive impairment, occasional left-sided neglect, left lower facial weakness and mild dysphagia with ongoing improvement     PLAN:  1. Right MCA stroke s/p IR:  -Continue HEP as recommended after therapy sessions and possibly restart once Medicaid approved -  Post stroke anxiety and mood changes: Initiate sertraline 25 mg daily and may consider initiating short-term Seroquel if needed -Continue aspirin 325 mg daily and Brilinta (ticagrelor) 90 mg bid  and initiate Crestor 20 mg daily for secondary stroke prevention.  -Right M1 stent placement: Follow-up with IR 08/2019 for surveillance monitoring as well as ongoing duration of aspirin and Brilinta -Plans on the recorder placement once insurance approved -Repeat abnormal hypercoagulable panel once insurance approved -Maintain strict control of hypertension with blood pressure goal below 130/90, diabetes with hemoglobin A1c goal below 6.5% and cholesterol with LDL cholesterol (bad cholesterol) goal below 70 mg/dL.  I also advised the patient to eat a healthy diet with plenty of whole grains, cereals, fruits and vegetables, exercise regularly with at least 30 minutes of continuous activity daily and maintain ideal body weight. 2. HTN: Stable.  Continue to follow with PCP for monitoring management 3. HLD: Intolerant to atorvastatin due to myalgias.  Recommend initiating Crestor 20 mg daily and follow-up with PCP for ongoing prescribing,  monitoring and management 4. New dx DMII: Advised to continue to monitor glucose levels at home along with continued follow-up with PCP for management and monitoring    Follow up in 3 months or call earlier if needed   I spent 50 minutes of face-to-face and non-face-to-face time with patient and daughter.  This included previsit chart review, lab review, study review, order entry, electronic health record documentation, patient education regarding recent stroke, residual deficits, importance of managing stroke risk factors and answered all questions to patient and daughters satisfaction     Frann Rider, [facility]  [facility] [address] [place], [place] [address]  Phone [phone] Fax [phone] Note: This document was prepared with digital dictation and possible smart phrase technology. Any transcriptional errors that result from this process are unintentional.

## 2019-07-23 NOTE — Progress Notes (Signed)
I agree with the above plan 

## 2019-07-24 ENCOUNTER — Encounter: Payer: Medicaid Other | Admitting: Physical Medicine & Rehabilitation

## 2019-07-25 MED FILL — SERTRALINE HCL 25 MG TABLET: 25 | 30 days supply | Qty: 30 | Fill #0

## 2019-07-25 MED FILL — ROSUVASTATIN CALCIUM 20 MG: 20 | 30 days supply | Qty: 30 | Fill #0

## 2019-07-30 ENCOUNTER — Encounter (HOSPITAL_COMMUNITY): Payer: Self-pay | Admitting: Speech Pathology

## 2019-07-30 ENCOUNTER — Ambulatory Visit: Payer: Medicaid Other | Attending: Family Medicine | Admitting: Family Medicine

## 2019-07-30 ENCOUNTER — Other Ambulatory Visit: Payer: Self-pay

## 2019-07-30 DIAGNOSIS — E119 Type 2 diabetes mellitus without complications: Secondary | ICD-10-CM

## 2019-07-30 DIAGNOSIS — F064 Anxiety disorder due to known physiological condition: Secondary | ICD-10-CM

## 2019-07-30 DIAGNOSIS — I63311 Cerebral infarction due to thrombosis of right middle cerebral artery: Secondary | ICD-10-CM | POA: Diagnosis not present

## 2019-07-30 MED ORDER — TRUE METRIX METER DEVI
1.0000 | Freq: Three times a day (TID) | Status: DC
Start: 2019-07-30 — End: 2019-07-30

## 2019-07-30 MED ORDER — TRUE METRIX METER DEVI: 1.0000 | Freq: Three times a day (TID) | 0 refills | Status: DC

## 2019-07-30 MED ORDER — TRUEPLUS LANCETS 28G MISC: 1.0000 | Freq: Three times a day (TID) | 12 refills | Status: DC

## 2019-07-30 MED ORDER — METFORMIN HCL 500 MG PO TABS: 250.0000 mg | ORAL_TABLET | Freq: Every day | ORAL | 3 refills | Status: DC

## 2019-07-30 MED ORDER — TRUEPLUS LANCETS 28G MISC
1.0000 | Freq: Three times a day (TID) | 12 refills | Status: DC
Start: 2019-07-30 — End: 2019-07-30

## 2019-07-30 NOTE — Progress Notes (Signed)
Virtual Visit via Telephone Note  I connected with Scott Day, on 07/30/2019 at 2:40 PM by telephone due to the COVID-19 pandemic and verified that I am speaking with the correct person using two identifiers.   Consent: I discussed the limitations, risks, security and privacy concerns of performing an evaluation and management service by telephone and the availability of in person appointments. I also discussed with the patient that there may be a patient responsible charge related to this service. The patient expressed understanding and agreed to proceed.   Location of Patient: Home  Location of Provider: Clinic   Persons participating in Telemedicine visit: Gustin Ekdahl Farrington-CMA Amy Alroy Dust -friend Heather Maxcy Dr. Margarita Rana     History of Present Illness: Scott Day is a 59 year old male with a history of newly diagnosed type 2 diabetes mellitus (A1c 8.3), hypertension, history of colon cancer right MCA infarct with right M1 distal occlusion s/p IR with right M1 stent placement here to establish care.  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  He states he Is doing okay but he has intermittent headaches since his stroke and this occurs in his R temple. This is rated as 5/10 and it has been intermittent and slightly relieved by taking Tylenol. He has residual L hand weakness he does have word finding difficulties and also problems with time awareness, planning and L hemi neglect He has a 1 year supply of Brilinta which he received from [facility] Could not complete PT due to lack of insurance and so he has been working on home exercises Plan was also for cardiology evaluation for loop recorder however this has not happened due to his lack of medical coverage. He has no Glucometer but uses one his daughter gave him Blood sugars range 120-140 with no episodes of hypoglycemia.   Past Medical History:   Diagnosis Date  . Cancer [facility]) 2012   colon cancer  . Closed fracture of left distal radius   . GERD (gastroesophageal reflux disease)    No Known Allergies  Current Outpatient Medications on File Prior to Visit  Medication Sig Dispense Refill  . acetaminophen (TYLENOL) 325 MG tablet Take 2 tablets (650 mg total) by mouth every 4 (four) hours as needed for mild pain (or temp > 37.5 C (99.5 F)).    Marland Kitchen aspirin 81 MG chewable tablet Chew 1 tablet (81 mg total) by mouth daily.    . metFORMIN (GLUCOPHAGE) 500 MG tablet Take 0.5 tablets (250 mg total) by mouth daily with breakfast. 30 tablet 0  . rosuvastatin (CRESTOR) 20 MG tablet Take 1 tablet (20 mg total) by mouth daily. 30 tablet 12  . sertraline (ZOLOFT) 25 MG tablet Take 1 tablet (25 mg total) by mouth at bedtime. 30 tablet 12  . ticagrelor (BRILINTA) 90 MG TABS tablet Take 1 tablet (90 mg total) by mouth 2 (two) times daily. 60 tablet 1  . atorvastatin (LIPITOR) 80 MG tablet Take 1 tablet (80 mg total) by mouth daily at 6 PM. (Patient not taking: Reported on 07/23/2019) 30 tablet 0   No current facility-administered medications on file prior to visit.    Observations/Objective: Awake, alert, oriented x3 Not in acute distress  Lab Results  Component Value Date   HGBA1C 8.3 (H) 06/05/2019    Assessment and Plan: 1. Cerebrovascular accident (CVA) due to thrombosis of right middle cerebral artery ([place]) Status post right M1 stent placement Has  follow-up with interventional radiology in 08/2019 Continue aspirin Brilinta Follow-up with neurology He will need to see cardiology for loop recorder.  Advised to apply for the [place] financial discount to facilitate referral Risk factor modification Continue Crestor  2. Diabetes mellitus, new onset ([place]) Newly diagnosed with A1c of 8.3; goal is <7 Blood sugars are normal - Blood Glucose Monitoring Suppl (TRUE METRIX METER) DEVI; 1 each by Does not apply route 3 (three) times daily  before meals.  Dispense: 1 each; Refill: 0 - metFORMIN (GLUCOPHAGE) 500 MG tablet; Take 0.5 tablets (250 mg total) by mouth daily with breakfast.  Dispense: 30 tablet; Refill: 3 - TRUEplus Lancets 28G MISC; 1 each by Does not apply route 3 (three) times daily before meals.  Dispense: 100 each; Refill: 12  3. Anxiety disorder due to medical condition Continue Zoloft   Follow Up Instructions: 3 months for chronic disease management.   I discussed the assessment and treatment plan with the patient. The patient was provided an opportunity to ask questions and all were answered. The patient agreed with the plan and demonstrated an understanding of the instructions.   The patient was advised to call back or seek an in-person evaluation if the symptoms worsen or if the condition fails to improve as anticipated.     I provided 17 minutes total of non-face-to-face time during this encounter including median intraservice time, reviewing previous notes, investigations, ordering medications, medical decision making, coordinating care and patient verbalized understanding at the end of the visit.     Charlott Rakes, MD, FAAFP. [facility] and [place], [place]   07/30/2019, 2:40 PM

## 2019-08-03 ENCOUNTER — Encounter: Payer: Self-pay | Admitting: Family Medicine

## 2019-08-06 ENCOUNTER — Encounter (HOSPITAL_COMMUNITY): Payer: Self-pay | Admitting: Speech Pathology

## 2019-08-13 ENCOUNTER — Encounter (HOSPITAL_COMMUNITY): Payer: Self-pay | Admitting: Speech Pathology

## 2019-08-21 ENCOUNTER — Encounter: Payer: Self-pay | Admitting: Physical Medicine & Rehabilitation

## 2019-08-21 ENCOUNTER — Other Ambulatory Visit: Payer: Self-pay

## 2019-08-21 ENCOUNTER — Encounter: Payer: Medicaid Other | Attending: Physical Medicine & Rehabilitation | Admitting: Physical Medicine & Rehabilitation

## 2019-08-21 ENCOUNTER — Telehealth: Payer: Self-pay

## 2019-08-21 VITALS — BP 159/87 | HR 75 | Temp 97.7°F | Ht 70.0 in | Wt 227.2 lb

## 2019-08-21 DIAGNOSIS — R4586 Emotional lability: Secondary | ICD-10-CM | POA: Insufficient documentation

## 2019-08-21 DIAGNOSIS — E119 Type 2 diabetes mellitus without complications: Secondary | ICD-10-CM | POA: Diagnosis present

## 2019-08-21 DIAGNOSIS — I63511 Cerebral infarction due to unspecified occlusion or stenosis of right middle cerebral artery: Secondary | ICD-10-CM | POA: Diagnosis present

## 2019-08-21 DIAGNOSIS — I69319 Unspecified symptoms and signs involving cognitive functions following cerebral infarction: Secondary | ICD-10-CM | POA: Insufficient documentation

## 2019-08-21 DIAGNOSIS — G8194 Hemiplegia, unspecified affecting left nondominant side: Secondary | ICD-10-CM | POA: Diagnosis present

## 2019-08-21 DIAGNOSIS — F17200 Nicotine dependence, unspecified, uncomplicated: Secondary | ICD-10-CM | POA: Insufficient documentation

## 2019-08-21 DIAGNOSIS — Z720 Tobacco use: Secondary | ICD-10-CM | POA: Insufficient documentation

## 2019-08-21 MED ORDER — SERTRALINE HCL 50 MG PO TABS
50.0000 mg | ORAL_TABLET | Freq: Every day | ORAL | 3 refills | Status: DC
Start: 2019-08-21 — End: 2019-08-22

## 2019-08-21 MED ORDER — QUETIAPINE FUMARATE 25 MG PO TABS: 25.0000 mg | ORAL_TABLET | Freq: Every day | ORAL | 1 refills | Status: DC

## 2019-08-21 NOTE — Telephone Encounter (Signed)
Due to ongoing behavioral concerns with underlying depression, recommend increasing sertraline to 50 mg daily.  We will also place order for Seroquel 25 mg nightly which will be used on a short-term basis until full therapeutic effect of sertraline is reached.  Please advise daughter on potential side effects such as increased fatigue, worsening of behaviors or anxiety or increase blood pressure.  Please advise daughter to continue increased sertraline dosage for 3 to 4 weeks and to call office for possible need of further increase.  Increased dose of sertraline and new prescription of Seroquel sent to [facility]

## 2019-08-21 NOTE — Telephone Encounter (Signed)
Daughter called relayed still with issues of mood, anger, patience.  Taking sertraline 25mg  po daily.  (no change).  Saw phys med/rehab MD today.  Please advise.

## 2019-08-21 NOTE — Telephone Encounter (Signed)
Pt's daughter, [name], called office and left a VM asking for a call to discuss increasing pt's zoloft. She can be reached at [phone].

## 2019-08-21 NOTE — Telephone Encounter (Signed)
LMVM for pt daughter to return call.

## 2019-08-21 NOTE — Addendum Note (Signed)
Addended by: Mal Misty on: 08/21/2019 03:49 PM   Modules accepted: Orders

## 2019-08-21 NOTE — Telephone Encounter (Signed)
I called pt and relayed that instructions/recommendations for the sertraline 50mg  po daily, and also seroquel mg po qhs until sertraline full effect.  I gave her the SE to look out for, and both called to [facility],  She verbalized understanding.

## 2019-08-21 NOTE — Progress Notes (Addendum)
Subjective:    Patient ID: Scott Day, male    DOB: 1961-02-08, 59 y.o.   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    He was last seen in clinic on 06/26/2019 by NP-notes reviewed.  He was also seen by PCP and neurology, notes reviewed-started on Zoloft.  Daughter supplements history. Since that time, patient followed up with Cards, but is awaiting Medicaid for Loop.  He sees surgery in 2 weeks. BP is slightly elevated. CBGs ~100-130. He is eating a regular texture diet now without difficulty.  He is smoking 1 PPD. Therapies are on hold until Medicaid approved. Denies falls. He has someone that stays with him.   Pain Inventory Average Pain 0 Pain Right Now 0 My pain is no pain  In the last 24 hours, has pain interfered with the following? General activity 0 Relation with others 0 Enjoyment of life 0 What TIME of day is your pain at its worst? no pain Sleep (in general) Fair  Pain is worse with: no pain Pain improves with: no pain Relief from Meds: no pain  Mobility walk without assistance how many minutes can you walk? 20 ability to climb steps?  yes do you drive?  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  yes  Neurologist put him on low dose of sertraline for his depression and lack of patience and anger   Physicians involved in your care Neurologist Xu   No family history on file. Social History   Socioeconomic History  . Marital status: Divorced    Spouse name: Not on file  . Number of children: Not on file  . Years of education: Not on file  . Highest education level: Not on file   Occupational History  . Not on file  Tobacco Use  . Smoking status: Current Every Day Smoker    Packs/day: 1.00    Start date: 07/20/2019  . Smokeless tobacco: Never Used  . Tobacco comment: started back in April "patches did not work"  Substance and Sexual Activity  . Alcohol use: Yes    Alcohol/week: 24.0 standard drinks    Types: 24 Cans of beer per week    Comment: 12 to 24 beers daily  . Drug use: Never  . Sexual activity: Not on file  Other Topics Concern  . Not on file  Social History Narrative  . Not on file   Social Determinants of Health   Financial Resource Strain:   . Difficulty of Paying Living Expenses:   Food Insecurity:   . Worried About Charity fundraiser in the Last Year:   . Arboriculturist in the Last Year:   Transportation Needs:   . Film/video editor (Medical):   Marland Kitchen Lack of Transportation (Non-Medical):   Physical Activity:   . Days of Exercise per Week:   . Minutes of Exercise per Session:   Stress:   . Feeling of Stress :   Social Connections:   . Frequency of Communication with Friends and Family:   . Frequency of Social Gatherings with Friends and Family:   . Attends Religious Services:   . Active Member  of Clubs or Organizations:   . Attends Archivist Meetings:   Marland Kitchen Marital Status:    Past Surgical History:  Procedure Laterality Date  . BUBBLE STUDY  06/08/2019   Procedure: BUBBLE STUDY;  Surgeon: Buford Dresser, MD;  Location: [facility] ENDOSCOPY;  Service: Cardiovascular;;  . COLON SURGERY  2012   colon cancer  . IR CT HEAD LTD  06/04/2019  . IR CT HEAD LTD  06/04/2019  . IR INTRA CRAN STENT  06/04/2019  . IR PERCUTANEOUS ART THROMBECTOMY/INFUSION INTRACRANIAL INC DIAG ANGIO  06/04/2019  . IR US GUIDE VASC ACCESS RIGHT  06/04/2019  . OPEN REDUCTION INTERNAL FIXATION (ORIF) DISTAL RADIAL FRACTURE Left 09/20/2017   Procedure: OPEN REDUCTION INTERNAL FIXATION (ORIF) DISTAL RADIAL FRACTURE;  Surgeon: Leanora Cover, MD;  Location:  [place];  Service: Orthopedics;  Laterality: Left;  . RADIOLOGY WITH ANESTHESIA N/A 06/04/2019   Procedure: IR WITH ANESTHESIA;  Surgeon: Radiologist, Medication, MD;  Location: [facility];  Service: Radiology;  Laterality: N/A;  . TEE WITHOUT CARDIOVERSION N/A 06/08/2019   Procedure: TRANSESOPHAGEAL ECHOCARDIOGRAM (TEE);  Surgeon: Buford Dresser, MD;  Location: [facility] ENDOSCOPY;  Service: Cardiovascular;  Laterality: N/A;   Past Medical History:  Diagnosis Date  . Cancer [facility]) 2012   colon cancer  . Closed fracture of left distal radius   . GERD (gastroesophageal reflux disease)    BP (!) 159/87   Pulse 75   Temp 97.7 F (36.5 C)   Ht 5\' 10"  (1.778 m)   Wt 227 lb 3.2 oz (103.1 kg)   SpO2 96%   BMI 32.60 kg/m   Opioid Risk Score:   Fall Risk Score:  `1  Depression screen PHQ 2/9  Depression screen [facility] 2/9 08/21/2019 08/21/2019 06/26/2019  Decreased Interest 3 0 0  Down, Depressed, Hopeless 3 0 0  PHQ - 2 Score 6 0 0  Altered sleeping - - 0  Tired, decreased energy - - 0  Change in appetite - - 0  Feeling bad or failure about yourself  - - 0  Trouble concentrating - - 3  Moving slowly or fidgety/restless - - 1  Suicidal thoughts - - 0  PHQ-9 Score - - 4  Difficult doing work/chores - - Not difficult at all   Review of Systems  Constitutional: Negative.   HENT: Negative.   Eyes: Negative.   Respiratory: Negative.   Cardiovascular: Negative.   Gastrointestinal: Negative.   Endocrine: Negative.   Genitourinary: Negative.   Musculoskeletal: Negative.   Skin: Negative.   Allergic/Immunologic: Negative.   Neurological: Negative.   Hematological: Negative.   Psychiatric/Behavioral: Positive for agitation, confusion and dysphoric mood. The patient is nervous/anxious.   All other systems reviewed and are negative.      Objective:   Physical Exam Constitutional: No distress . Vital signs reviewed. HENT: Normocephalic.  Atraumatic. Eyes: EOMI. No  discharge. Cardiovascular: No JVD. Respiratory: Normal effort.  No stridor. GI: Non-distended. Skin: Warm and dry.  Intact. Psych: Some delay. Musc: No edema in extremities.  No tenderness in extremities. Neurologic: Alert Motor:  LUE/LLE: 4+-5/5 proximal distal, improving     Assessment & Plan:  Right-handed male with history of colon cancer 2012, GERD, tobacco abuse presents for follow-up for right MCA territory infarction with right M1 distal occlusion status post stenting.    1.  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   Continue to follow up with Neurology  Restart therapies after Medicaid  Follow up with Cards regarding Loop  2.  Hypertension.    Elevated today, however, controlled at other appointments per daughter  3.  New findings of diabetes mellitus.    Relatively controlled at present  4.  Post stroke dysphagia  Resolved  5.  Tobacco abuse  Encouraged abstinence, educated on smoking cessation  Encouraged follow up with PCP regarding abortive options  6. Mood instability - exacerbated by stressors with anger  Started on Zoloft by Neuro  Cont to monitor  Referral for CBT

## 2019-08-22 MED ORDER — QUETIAPINE FUMARATE 25 MG PO TABS: 25.0000 mg | ORAL_TABLET | Freq: Every day | ORAL | 1 refills | Status: DC

## 2019-08-22 MED ORDER — SERTRALINE HCL 50 MG PO TABS: 50.0000 mg | ORAL_TABLET | Freq: Every day | ORAL | 3 refills | Status: DC

## 2019-08-22 MED FILL — SERTRALINE HCL 50 MG TABLET: 50 | 30 days supply | Qty: 30 | Fill #0

## 2019-08-22 MED FILL — QUETIAPINE FUMARATE 25 MG T: 25 | 30 days supply | Qty: 30 | Fill #0

## 2019-08-22 NOTE — Telephone Encounter (Signed)
Pt girlfriend amy called to verify pts medications states she organizes his pill box and helps pt get his medications from pharmacy but is needing to verify what medications pt should be taking a dosage   Cb#[phone]

## 2019-08-22 NOTE — Addendum Note (Signed)
Addended by: Brandon Melnick on: 08/22/2019 02:51 PM   Modules accepted: Orders

## 2019-08-22 NOTE — Telephone Encounter (Signed)
Spoke to Amy, she relayed cost facotor in getting medications.  [facility] lower [facility]. [facility]  She will see and may call us back.  I relayed that we have not DPR for her or heather, daughter who came in with pt.  Pt needs to sign and place both people on DPR.  HCPOA was filed in hospital she stated.

## 2019-09-04 ENCOUNTER — Other Ambulatory Visit: Payer: Self-pay

## 2019-09-04 ENCOUNTER — Ambulatory Visit: Payer: Self-pay | Admitting: Psychology

## 2019-09-04 ENCOUNTER — Ambulatory Visit (HOSPITAL_COMMUNITY)
Admission: RE | Admit: 2019-09-04 | Discharge: 2019-09-04 | Disposition: A | Discharge status: Home / Self Care | Payer: Self-pay | Source: Ambulatory Visit | Attending: Student | Admitting: Student

## 2019-09-04 ENCOUNTER — Telehealth: Payer: Self-pay

## 2019-09-04 DIAGNOSIS — E119 Type 2 diabetes mellitus without complications: Secondary | ICD-10-CM

## 2019-09-04 DIAGNOSIS — I63511 Cerebral infarction due to unspecified occlusion or stenosis of right middle cerebral artery: Secondary | ICD-10-CM

## 2019-09-04 NOTE — Progress Notes (Signed)
Chief Complaint: Patient was seen in consultation today for CVA s/p revascularization follow-up.  Referring Physician(s): Code Stroke- [name]  Supervising Physician: Pedro Earls  Patient Status: [facility] - Out-pt  History of Present Illness: Scott Day is a 59 y.o. male with a past medical history as below, with pertinent past medical history including hypertension, CVA 05/2019, GERD, colon cancer, obesity, and current tobacco use. He is known to [facility] and has been followed by Dr. Karenann Cai since 05/2019. He first presented to our department at the request of Dr. Leonel Ramsay as an active code stroke. He underwent an image-guided cerebral arteriogram with emergent mechanical thrombectomy and stent placement of mid/distal right MCA M1 occlusion achieving a TICI 3 revascularization via right femoral approach 06/04/2019 by Dr. Karenann Cai. He was discharged to [facility] 06/09/2019 and discharged home 06/16/2019 in stable condition.  Patient presents today for follow-up regarding his recent procedure 06/04/2019. Patient awake and alert sitting in chair. Accompanied by daughter. Complains of mild headaches. Complains of memory loss and anger issues since CVA. Complains of left-sided weakness, improved with help of PT/OT. Denies issues with right groin puncture site- states healed well, no tenderness to palpation, no pain at site when taking a step. Denies numbness/tingling, dizziness, vision changes, hearing changes, tinnitus, or speech difficulty.  Currently taking Brilinta 90 mg twice daily and Aspirin 81 mg once daily.   Past Medical History:  Diagnosis Date  . Cancer [facility]) 2012   colon cancer  . Closed fracture of left distal radius   . GERD (gastroesophageal reflux disease)     Past Surgical History:  Procedure Laterality Date  . BUBBLE STUDY  06/08/2019   Procedure: BUBBLE STUDY;  Surgeon: Buford Dresser, MD;  Location: [facility]  ENDOSCOPY;  Service: Cardiovascular;;  . COLON SURGERY  2012   colon cancer  . IR CT HEAD LTD  06/04/2019  . IR CT HEAD LTD  06/04/2019  . IR INTRA CRAN STENT  06/04/2019  . IR PERCUTANEOUS ART THROMBECTOMY/INFUSION INTRACRANIAL INC DIAG ANGIO  06/04/2019  . IR US GUIDE VASC ACCESS RIGHT  06/04/2019  . OPEN REDUCTION INTERNAL FIXATION (ORIF) DISTAL RADIAL FRACTURE Left 09/20/2017   Procedure: OPEN REDUCTION INTERNAL FIXATION (ORIF) DISTAL RADIAL FRACTURE;  Surgeon: Leanora Cover, MD;  Location: [place];  Service: Orthopedics;  Laterality: Left;  . RADIOLOGY WITH ANESTHESIA N/A 06/04/2019   Procedure: IR WITH ANESTHESIA;  Surgeon: Radiologist, Medication, MD;  Location: [place];  Service: Radiology;  Laterality: N/A;  . TEE WITHOUT CARDIOVERSION N/A 06/08/2019   Procedure: TRANSESOPHAGEAL ECHOCARDIOGRAM (TEE);  Surgeon: Buford Dresser, MD;  Location: [facility] ENDOSCOPY;  Service: Cardiovascular;  Laterality: N/A;    Allergies: Patient has no known allergies.  Medications: Prior to Admission medications   Medication Sig Start Date End Date Taking? Authorizing Provider  acetaminophen (TYLENOL) 325 MG tablet Take 2 tablets (650 mg total) by mouth every 4 (four) hours as needed for mild pain (or temp > 37.5 C (99.5 F)). 06/14/19   Angiulli, Lavon Paganini, PA-C  aspirin 81 MG chewable tablet Chew 1 tablet (81 mg total) by mouth daily. 06/10/19   Rinehuls, Early Chars, PA-C  Blood Glucose Monitoring Suppl (TRUE METRIX METER) DEVI 1 each by Does not apply route 3 (three) times daily before meals. 07/30/19   Charlott Rakes, MD  metFORMIN (GLUCOPHAGE) 500 MG tablet Take 0.5 tablets (250 mg total) by mouth daily with breakfast. 07/30/19   Charlott Rakes, MD  QUEtiapine (SEROQUEL)  25 MG tablet Take 1 tablet (25 mg total) by mouth at bedtime. 08/22/19   Frann Rider, NP  rosuvastatin (CRESTOR) 20 MG tablet Take 1 tablet (20 mg total) by mouth daily. 07/23/19   Frann Rider, NP  sertraline (ZOLOFT) 50 MG  tablet Take 1 tablet (50 mg total) by mouth at bedtime. 08/22/19   Frann Rider, NP  ticagrelor (BRILINTA) 90 MG TABS tablet Take 1 tablet (90 mg total) by mouth 2 (two) times daily. 06/14/19   Angiulli, Lavon Paganini, PA-C  TRUEplus Lancets 28G MISC 1 each by Does not apply route 3 (three) times daily before meals. 07/30/19   Charlott Rakes, MD     No family history on file.  Social History   Socioeconomic History  . Marital status: Divorced    Spouse name: Not on file  . Number of children: Not on file  . Years of education: Not on file  . Highest education level: Not on file  Occupational History  . Not on file  Tobacco Use  . Smoking status: Current Every Day Smoker    Packs/day: 1.00    Start date: 07/20/2019  . Smokeless tobacco: Never Used  . 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 Vaping Use: Former  Substance and Sexual Activity  . Alcohol use: Yes    Alcohol/week: 24.0 standard drinks    Types: 24 Cans of beer per week    Comment: 12 to 24 beers daily  . Drug use: Never  . Sexual activity: Not on file  Other Topics Concern  . Not on file  Social History Narrative  . Not on file   Social Determinants of Health   Financial Resource Strain:   . Difficulty of Paying Living Expenses:   Food Insecurity:   . Worried About Charity fundraiser in the Last Year:   . Arboriculturist in the Last Year:   Transportation Needs:   . Film/video editor (Medical):   Marland Kitchen Lack of Transportation (Non-Medical):   Physical Activity:   . Days of Exercise per Week:   . Minutes of Exercise per Session:   Stress:   . Feeling of Stress :   Social Connections:   . Frequency of Communication with Friends and Family:   . Frequency of Social Gatherings with Friends and Family:   . Attends Religious Services:   . Active Member of Clubs or Organizations:   . Attends Archivist Meetings:   Marland Kitchen Marital Status:      Review of Systems: A 12  point ROS discussed and pertinent positives are indicated in the HPI above.  All other systems are negative.  Review of Systems  Constitutional: Negative for chills and fever.  HENT: Negative for hearing loss and tinnitus.   Eyes: Negative for visual disturbance.  Respiratory: Negative for shortness of breath and wheezing.   Cardiovascular: Negative for chest pain and palpitations.  Gastrointestinal: Negative for abdominal pain.  Neurological: Positive for weakness and headaches. Negative for dizziness, speech difficulty and numbness.  Psychiatric/Behavioral: Negative for behavioral problems and confusion.    Vital Signs: There were no vitals taken for this visit.  Physical Exam Constitutional:      General: He is not in acute distress.    Appearance: Normal appearance.  Cardiovascular:     Rate and Rhythm: Normal rate and regular rhythm.     Heart sounds: Normal heart sounds. No murmur heard.  Pulmonary:     Effort: Pulmonary effort is normal. No respiratory distress.     Breath sounds: Normal breath sounds. No wheezing.  Skin:    General: Skin is warm and dry.  Neurological:     Mental Status: He is alert and oriented to person, place, and time.     Comments: Can spontaneously move all extremities. Speech and comprehension intact.      Imaging: No results found.  Labs:  CBC: Recent Labs    06/07/19 0414 06/08/19 0345 06/09/19 0432 06/11/19 0543  WBC 6.8 7.3 13.4* 10.9*  HGB 14.6 14.8 15.0 15.1  HCT 42.9 43.5 43.1 44.5  PLT 211 219 212 236    COAGS: Recent Labs    06/04/19 1125  INR 1.1  APTT 31    BMP: Recent Labs    06/07/19 0414 06/08/19 0345 06/09/19 0432 06/11/19 0543  NA 141 142 137 137  K 3.5 3.5 3.3* 3.8  CL 109 109 105 101  CO2 23 22 19* 25  GLUCOSE 115* 128* 128* 133*  BUN 6 8 8 10   CALCIUM 8.7* 9.0 9.0 9.0  CREATININE 0.85 0.85 0.85 1.07  GFRNONAA >60 >60 >60 >60  GFRAA >60 >60 >60 >60    LIVER FUNCTION TESTS: Recent Labs     06/04/19 1125 06/11/19 0543  BILITOT 0.9 0.7  AST 19 25  ALT 24 42  ALKPHOS 74 86  PROT 6.9 6.8  ALBUMIN 3.9 3.3*     Assessment and Plan:  Acute CVA s/p cerebral arteriogram with emergent mechanical thrombectomy and stent placement of mid/distal right MCA M1 occlusion achieving a TICI 3 revascularization via right femoral approach 06/04/2019 by Dr. Karenann Cai. Informed patient and daughter that Dr. Karenann Cai is in an emergent code stroke procedure that requires her immediate attention at this time. Because of this, she is not able to meet with patient/daughter today and they will be meeting with me instead. Patient and daughter convey understanding and agree to meet with me.  Discussed post-stroke course. Patient and daughter state that patient has had memory loss issues and anger issues since stroke. States that he has seen neurology for this who has started him on medications. Advised patient to follow-up with neurology regularly regarding memory loss/anger issues, and for secondary stroke prevention.  Discussed right MCA stent. Explained the best course of management for stent at this time is with continued DAPT (Brilinta 90 mg twice daily and Aspirin 81 mg once daily), along with follow-up diagnostic cerebral arteriogram 6 months from placement. Explained procedure, including risks and benefits. Explained indication for this procedure is to evaluate for intra-stent stenosis- depending on results of this procedure, can determine if patient can stop DAPT. Patient expresses desire to move forward with procedure. Plan for follow-up with an image-guided diagnostic cerebral arteriogram 6 months from stent placement 06/04/2019. Informed patient that our schedulers will call him to set up this procedure. Instructed patient to continue taking Brilinta 90 mg twice daily and Aspirin 81 mg once daily.  Discussed tobacco use. Patient counseled on tobacco cessation. Patient states  that he may require assistance with this- advised patient to follow-up with PCP regarding assistance with tobacco cassation.  All questions answered and concerns addressed. Patient and daughter convey understanding and agree with plan.  Thank you for this interesting consult.  I greatly enjoyed meeting Scott Day and look forward to participating in their care.  A copy of this report was sent to the requesting  provider on this date.  Electronically Signed: Earley Abide, PA-C 09/04/2019, 12:39 PM   I spent a total of 40 Minutes in face to face in clinical consultation, greater than 50% of which was counseling/coordinating care for CVA s/p revascularization follow-up.

## 2019-09-04 NOTE — Telephone Encounter (Signed)
Orders for labs are in.  This should be done fasting.

## 2019-09-04 NOTE — Telephone Encounter (Signed)
Dr. Margarita Rana, pt is scheduled for lab appt Monday 09/11/19 for A1C. Per pt states he was to have labs done.  Please confirm and place order if appropriate.

## 2019-09-10 ENCOUNTER — Ambulatory Visit: Payer: Self-pay | Attending: Family Medicine

## 2019-09-10 ENCOUNTER — Other Ambulatory Visit: Payer: Self-pay

## 2019-09-10 ENCOUNTER — Ambulatory Visit: Payer: Self-pay

## 2019-09-10 DIAGNOSIS — E119 Type 2 diabetes mellitus without complications: Secondary | ICD-10-CM

## 2019-09-11 LAB — LIPID PANEL
Chol/HDL Ratio: 3.9 ratio (ref 0.0–5.0)
Cholesterol, Total: 140 mg/dL (ref 100–199)
HDL: 36 mg/dL — ABNORMAL LOW (ref >39)
LDL Chol Calc (NIH): 74 mg/dL (ref 0–99)
Triglycerides: 177 mg/dL — ABNORMAL HIGH (ref 0–149)
VLDL Cholesterol Cal: 30 mg/dL (ref 5–40)

## 2019-09-11 LAB — MICROALBUMIN, URINE: Microalbumin, Urine: 3 ug/mL

## 2019-09-11 LAB — CMP14+EGFR
ALT: 26 IU/L (ref 0–44)
AST: 20 IU/L (ref 0–40)
Albumin/Globulin Ratio: 2 (ref 1.2–2.2)
Albumin: 4.6 g/dL (ref 3.8–4.9)
Alkaline Phosphatase: 87 IU/L (ref 48–121)
BUN/Creatinine Ratio: 11 (ref 9–20)
BUN: 11 mg/dL (ref 6–24)
Bilirubin Total: 0.5 mg/dL (ref 0.0–1.2)
CO2: 24 mmol/L (ref 20–29)
Calcium: 9.5 mg/dL (ref 8.7–10.2)
Chloride: 103 mmol/L (ref 96–106)
Creatinine, Ser: 1 mg/dL (ref 0.76–1.27)
GFR calc Af Amer: 95 mL/min/{1.73_m2} (ref >59)
GFR calc non Af Amer: 82 mL/min/{1.73_m2} (ref >59)
Globulin, Total: 2.3 g/dL (ref 1.5–4.5)
Glucose: 106 mg/dL — ABNORMAL HIGH (ref 65–99)
Potassium: 4.4 mmol/L (ref 3.5–5.2)
Sodium: 140 mmol/L (ref 134–144)
Total Protein: 6.9 g/dL (ref 6.0–8.5)

## 2019-09-11 LAB — HEMOGLOBIN A1C
Est. average glucose Bld gHb Est-mCnc: 134 mg/dL
Hgb A1c MFr Bld: 6.3 % — ABNORMAL HIGH (ref 4.8–5.6)

## 2019-09-19 ENCOUNTER — Other Ambulatory Visit: Payer: Self-pay

## 2019-09-19 MED FILL — QUETIAPINE FUMARATE 25 MG T: 25 | 30 days supply | Qty: 30 | Fill #1

## 2019-09-19 MED FILL — SERTRALINE HCL 50 MG TABLET: 50 | 30 days supply | Qty: 30 | Fill #1

## 2019-09-19 MED FILL — METFORMIN HCL 500 MG TABS: 500 | 30 days supply | Qty: 15 | Fill #1

## 2019-09-19 NOTE — Patient Outreach (Signed)
[place] [facility]) Care Management  09/19/2019  EDDY LISZEWSKI 05-13-1960 [id number]  First telephone outreach attempt to obtain mRS. No answer. Left message for returned call.  Ina Homes [facility] Management Assistant [phone]

## 2019-09-21 ENCOUNTER — Other Ambulatory Visit: Payer: Self-pay

## 2019-09-21 NOTE — Patient Outreach (Signed)
[place] [facility]) Care Management  09/21/2019  Scott Day March 06, 1961 [id number]  Telephone outreach to patient to obtain mRS was successfully completed. MRS=4  Ina Homes [facility] Management Assistant [phone]

## 2019-09-28 MED FILL — ROSUVASTATIN CALCIUM 20 MG: 20 | 30 days supply | Qty: 30 | Fill #1

## 2019-10-02 ENCOUNTER — Encounter: Payer: Medicaid Other | Attending: Physical Medicine & Rehabilitation | Admitting: Physical Medicine & Rehabilitation

## 2019-10-02 ENCOUNTER — Encounter: Payer: Self-pay | Admitting: Physical Medicine & Rehabilitation

## 2019-10-02 ENCOUNTER — Other Ambulatory Visit: Payer: Self-pay

## 2019-10-02 VITALS — BP 144/88 | HR 64 | Temp 98.1°F | Ht 70.0 in | Wt 228.6 lb

## 2019-10-02 DIAGNOSIS — I69319 Unspecified symptoms and signs involving cognitive functions following cerebral infarction: Secondary | ICD-10-CM

## 2019-10-02 DIAGNOSIS — F17200 Nicotine dependence, unspecified, uncomplicated: Secondary | ICD-10-CM

## 2019-10-02 DIAGNOSIS — I63511 Cerebral infarction due to unspecified occlusion or stenosis of right middle cerebral artery: Secondary | ICD-10-CM | POA: Diagnosis not present

## 2019-10-02 DIAGNOSIS — E119 Type 2 diabetes mellitus without complications: Secondary | ICD-10-CM | POA: Insufficient documentation

## 2019-10-02 DIAGNOSIS — Z720 Tobacco use: Secondary | ICD-10-CM

## 2019-10-02 DIAGNOSIS — G8194 Hemiplegia, unspecified affecting left nondominant side: Secondary | ICD-10-CM | POA: Diagnosis not present

## 2019-10-02 DIAGNOSIS — R4586 Emotional lability: Secondary | ICD-10-CM | POA: Diagnosis not present

## 2019-10-02 MED ORDER — ACCU-CHEK AVIVA CONNECT W/DEVICE KIT: PACK | 0 refills | Status: AC

## 2019-10-02 MED ORDER — ACCU-CHEK AVIVA PLUS VI STRP: ORAL_STRIP | 12 refills | Status: AC

## 2019-10-02 MED ORDER — ACCU-CHEK SOFTCLIX LANCET DEV KIT: PACK | 0 refills | Status: AC

## 2019-10-02 NOTE — Progress Notes (Signed)
Subjective:    Patient ID: Scott Day, male    DOB: December 09, 1960, 59 y.o.   MRN: [id number]  HPI Right-handed male with history of colon cancer 2012, GERD, tobacco abuse presents for follow-up for right MCA territory infarction with right M1 distal occlusion status post stenting.    Significant other supplements history. He was last seen in clinic on 08/21/19. Since that time, pt is following up with neuro.  He was approved for Medicaid.  He has not started therapies.  He saw VIR with follow up planned. He has not followed up with Cards yet. BP is slightly elevated. He is on Zoloft and Seroquel.  He has not followed up with CBT. He started smoking again.  CBGs have been controlled.  Denies falls.  Pain Inventory Average Pain 6 Pain Right Now 0 My pain is sharp, dull and stabbing  In the last 24 hours, has pain interfered with the following? General activity 0 Relation with others 0 Enjoyment of life 0 What TIME of day is your pain at its worst? evening Sleep (in general) Fair  Pain is worse with: no pain Pain improves with: medication Relief from Meds: 4  Mobility walk without assistance how many minutes can you walk? 20 ability to climb steps?  yes do you drive?  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  yes  Neurologist put him on low dose of sertraline for his depression and lack of patience and anger   Physicians involved in your care Neurologist Xu   No family history on file. Social History   Socioeconomic History   Marital status: Divorced    Spouse name: Not on file   Number of children: Not on file   Years of education: Not on file   Highest education level: Not on file  Occupational History   Not on file  Tobacco Use    Smoking status: Current Every Day Smoker    Packs/day: 1.00    Start date: 07/20/2019   Smokeless tobacco: Never Used   Tobacco comment: started back in April "patches did not work"  Scientific laboratory technician Use: Former  Substance and Sexual Activity   Alcohol use: Yes    Alcohol/week: 24.0 standard drinks    Types: 24 Cans of beer per week    Comment: 12 to 24 beers daily   Drug use: Never   Sexual activity: Not on file  Other Topics Concern   Not on file  Social History Narrative   Not on file   Social Determinants of Health   Financial Resource Strain:    Difficulty of Paying Living Expenses:   Food Insecurity:    Worried About Charity fundraiser in the Last Year:    Arboriculturist in the Last Year:   Transportation Needs:    Film/video editor (Medical):    Lack of Transportation (Non-Medical):   Physical Activity:    Days of Exercise per Week:    Minutes of Exercise per Session:   Stress:    Feeling of Stress :   Social Connections:    Frequency of Communication with Friends and Family:    Frequency of Social Gatherings with Friends and Family:    Attends Religious Services:    Active Member of  Clubs or Organizations:    Attends Music therapist:    Marital Status:    Past Surgical History:  Procedure Laterality Date   BUBBLE STUDY  06/08/2019   Procedure: BUBBLE STUDY;  Surgeon: Buford Dresser, MD;  Location: [place];  Service: Cardiovascular;;   COLON SURGERY  2004   colon cancer   IR CT HEAD LTD  06/04/2019   IR CT HEAD LTD  06/04/2019   IR INTRA CRAN STENT  06/04/2019   IR PERCUTANEOUS ART THROMBECTOMY/INFUSION INTRACRANIAL INC DIAG ANGIO  06/04/2019   IR US GUIDE VASC ACCESS RIGHT  06/04/2019   OPEN REDUCTION INTERNAL FIXATION (ORIF) DISTAL RADIAL FRACTURE Left 09/20/2017   Procedure: OPEN REDUCTION INTERNAL FIXATION (ORIF) DISTAL RADIAL FRACTURE;  Surgeon: Leanora Cover, MD;  Location: [place];  Service: Orthopedics;  Laterality: Left;   RADIOLOGY WITH ANESTHESIA N/A 06/04/2019   Procedure: IR WITH ANESTHESIA;  Surgeon: Radiologist, Medication, MD;  Location: [place];  Service: Radiology;  Laterality: N/A;   TEE WITHOUT CARDIOVERSION N/A 06/08/2019   Procedure: TRANSESOPHAGEAL ECHOCARDIOGRAM (TEE);  Surgeon: Buford Dresser, MD;  Location: [facility] ENDOSCOPY;  Service: Cardiovascular;  Laterality: N/A;   Past Medical History:  Diagnosis Date   Cancer ([place]) 2012   colon cancer   Closed fracture of left distal radius    GERD (gastroesophageal reflux disease)    BP (!) 144/88    Pulse 64    Temp 98.1 F (36.7 C)    Ht 5\' 10"  (1.778 m)    Wt 228 lb 9.6 oz (103.7 kg)    SpO2 97%    BMI 32.80 kg/m   Opioid Risk Score:   Fall Risk Score:  `1  Depression screen PHQ 2/9  Depression screen [facility] 2/9 08/21/2019 08/21/2019 06/26/2019  Decreased Interest 3 0 0  Down, Depressed, Hopeless 3 0 0  PHQ - 2 Score 6 0 0  Altered sleeping - - 0  Tired, decreased energy - - 0  Change in appetite - - 0  Feeling bad or failure about yourself  - - 0  Trouble concentrating - - 3  Moving slowly or fidgety/restless - - 1  Suicidal thoughts - - 0  PHQ-9 Score - - 4  Difficult doing work/chores - - Not difficult at all   Review of Systems  Constitutional: Negative.   HENT: Negative.   Eyes: Negative.   Respiratory: Negative.   Cardiovascular: Negative.   Gastrointestinal: Negative.   Endocrine: Negative.   Genitourinary: Negative.   Musculoskeletal: Negative.   Skin: Negative.   Allergic/Immunologic: Negative.   Neurological: Positive for dizziness and weakness.  Hematological: Negative.   Psychiatric/Behavioral: Positive for agitation, confusion and dysphoric mood. The patient is nervous/anxious.   All other systems reviewed and are negative.      Objective:   Physical Exam  Constitutional: No distress . Vital signs reviewed. HENT: Normocephalic.  Atraumatic. Eyes: EOMI. No  discharge. Cardiovascular: No JVD. Respiratory: Normal effort.  No stridor. GI: Non-distended. Skin: Warm and dry.  Intact. Psych: Delayed Musc: No edema in extremities.  No tenderness in extremities. Neurologic: Alert Motor:  LUE 4+-5/5 proximal to distal LLE: 4-4+/5 proximal distal    Assessment & Plan:  Right-handed male with history of colon cancer 2012, GERD, tobacco abuse presents for follow-up for right MCA territory infarction with right M1 distal occlusion status post stenting.     1.  Left-sided weakness with dysarthria, cognitive deficits secondary to right MCA territory infarction with right  M1 distal occlusion status post stenting.              Continue to follow up with Neurology             Restart therapies after Medicaid, reminded for follow up             Follow up with Cards regarding Loop, reminded   2.  Hypertension.               Elevated today, however, controlled overall per significant other   3.  New findings of diabetes mellitus.               Relatively controlled at present   4.  Tobacco abuse             Encouraged abstinence again, educated on smoking cessation again             Encouraged follow up with PCP regarding abortive options   5. Mood instability - exacerbated by stressors with anger             Started on Zoloft by Neuro             Cont to monitor             Referral for CBT, reminded  Will consider additional meds  6. ?OSA  Recommend follow up with PCP

## 2019-10-03 ENCOUNTER — Other Ambulatory Visit: Payer: Self-pay

## 2019-10-03 MED ORDER — ACCU-CHEK SOFTCLIX LANCETS MISC: 12 refills | Status: AC

## 2019-10-03 MED FILL — ACCU-CHEK GUIDE ME W/DEVICE: W/DEVICE | 1 days supply | Qty: 1 | Fill #0

## 2019-10-03 MED FILL — ACCU-CHEK GUIDE TEST STRIP: 25 days supply | Qty: 100 | Fill #0

## 2019-10-08 ENCOUNTER — Ambulatory Visit: Payer: Self-pay | Admitting: Psychology

## 2019-10-22 ENCOUNTER — Encounter: Payer: Medicaid Other | Attending: Physical Medicine & Rehabilitation | Admitting: Psychology

## 2019-10-22 ENCOUNTER — Other Ambulatory Visit: Payer: Self-pay

## 2019-10-22 ENCOUNTER — Telehealth: Payer: Self-pay

## 2019-10-22 ENCOUNTER — Encounter: Payer: Self-pay | Admitting: Adult Health

## 2019-10-22 DIAGNOSIS — F0151 Vascular dementia with behavioral disturbance: Secondary | ICD-10-CM | POA: Diagnosis not present

## 2019-10-22 DIAGNOSIS — F0630 Mood disorder due to known physiological condition, unspecified: Secondary | ICD-10-CM

## 2019-10-22 DIAGNOSIS — Z720 Tobacco use: Secondary | ICD-10-CM | POA: Insufficient documentation

## 2019-10-22 DIAGNOSIS — G3184 Mild cognitive impairment, so stated: Secondary | ICD-10-CM | POA: Diagnosis not present

## 2019-10-22 DIAGNOSIS — R4586 Emotional lability: Secondary | ICD-10-CM | POA: Insufficient documentation

## 2019-10-22 DIAGNOSIS — I63511 Cerebral infarction due to unspecified occlusion or stenosis of right middle cerebral artery: Secondary | ICD-10-CM | POA: Diagnosis present

## 2019-10-22 DIAGNOSIS — E119 Type 2 diabetes mellitus without complications: Secondary | ICD-10-CM | POA: Insufficient documentation

## 2019-10-22 DIAGNOSIS — G8194 Hemiplegia, unspecified affecting left nondominant side: Secondary | ICD-10-CM | POA: Diagnosis present

## 2019-10-22 DIAGNOSIS — F0391 Unspecified dementia with behavioral disturbance: Secondary | ICD-10-CM

## 2019-10-22 MED FILL — METFORMIN HCL 500 MG TABS: 500 | 30 days supply | Qty: 15 | Fill #2

## 2019-10-22 MED FILL — ACCU-CHEK GUIDE TEST STRIP: 25 days supply | Qty: 100 | Fill #0

## 2019-10-22 MED FILL — ACCU-CHEK SOFTCLIX LANCETS: 33 days supply | Qty: 100 | Fill #0

## 2019-10-22 MED FILL — ACCU-CHEK GUIDE ME W/DEVICE: W/DEVICE | 1 days supply | Qty: 1 | Fill #0

## 2019-10-22 MED FILL — SERTRALINE HCL 50 MG TABLET: 50 | 30 days supply | Qty: 30 | Fill #2

## 2019-10-22 NOTE — Telephone Encounter (Signed)
Received PA request for seroquel. Completed via covermymeds. Key: Scott Day to [place] .  PA Case: [id number], Status: Approved, Coverage Starts on: 10/22/2019 12:00:00 AM, Coverage Ends on: 10/21/2020 12:00:00 AM.

## 2019-10-22 NOTE — Telephone Encounter (Signed)
Can you please verify if Scott Day (SO) is on DPR or if a DPR was filled out?  I am unable to verify through epic.  There was a documented advanced directive but only lists daughter [name] (not Scott Day).  If we have been given permission to speak to Scott Day, I will gladly return phone call.  Thank you.

## 2019-10-22 NOTE — Progress Notes (Signed)
NEUROBEHAVIORAL STATUS EXAM   Name: Scott Day Date of Birth: 15-Oct-1960 Date of Interview: 10/22/2019  Reason for Referral:  Scott Day is a 59 y.o. male who is referred for neuropsychological evaluation by Dr. 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  Residual deficits include mild dysphagia, visual impairment, cognitive impairment, and left sided neglect. This patient is accompanied in the office by his longtime friend and current partner who supplements the history.   History of Presenting Problem:  Reviewing medical records, it is apparent that the patient has continued with a lack of insight as to some of the residual deficits from his recent stroke; there are significant concerns about behavioral disturbance in the form of agitation, anger, and aggression. He is described as labile with a tendency to become very angry and threaten physical violence at times. He is described as showing little remorse or awareness during these anger outbursts and is extremely hard to redirected or calm down. The patient has difficulty when plans are changed quickly; He gets easily overwhelmed.    The patient's longtime friend and partner provided a great deal of information and concerns.  She has reportedly noticed significant inattention and short-term memory issues, dysarthric speech and changes to expressive language,  information processing speed difficulties increased agitation and frustration, anger outbursts, lack of motivation, confusion and disorientation, lack of safety awareness, slowed processing speed, motor weakness, and executive dysfunction (e.g., impulsivity, emotion dysregulation, disinhibition, inflexibility, etc.). She also described patient as having problems performing basic daily tasks including basic self care; he  may not take a shower for several days in a row.   The patient does express realistic concerns about his ability to work in the future and live independently. When asked about possible return to work, the patient acknowledged current physical and cognitive limitations and identified several reasonable aspects of his last job that would be negatively impacted. 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   When asked about driving, he admitted to currently driving short distances in his car or motorcycle on regular basis and appeared resistant to avoiding these activities in the future.  The patient became very angry and frustrated at the suggestion that he would be told not to drive or that this privilege could be taken away if he persisted against this restriction. The patient thinks he is fine driving.  Upon direct questioning, the patient reported:   Forgetting recent conversations/events: Endorsed Repeating statements/questions: Endorsed Misplacing/losing items: Endorsed Forgetting appointments or other obligations: Endorsed Forgetting to take medications: Endorsed  Difficulty concentrating: Endorsed Starting but not finishing tasks: Pharmacist, community easily: Occupational psychologist information more slowly: Endorsed   Word-finding difficulty: Mild problems  Word substitutions: Endorsed  Writing difficulty: Endorsed  Spelling difficulty: Longstanding  Comprehension difficulty: Mild-moderate difficulties   Getting lost when driving: Denied  Making wrong turns when driving: Denied  Uncertain about directions when driving or passenger: Denied   Current Functioning: Work: Unemployed.   Complex ADLs Driving: Admitted to currently driving short distances despite being told not to upon hospital discharge. Medication  management: Requires assistance  Management of finances: Requires assistance  Appointments: Requires assistance  Cooking: Requires assistance.   Medical/Physical complaints:  Any hx of stroke/TIA, MI, LOC/TBI, Sz? CVA involving Rt MCA on 06/04/19. Reportedly woke up with left hemiparesis, reduced coordination, confusion, and slurred speech.  Hx falls? Denied  Balance, probs walking? Reduced balance and coordi Sleep: Insomnia? OSA? CPAP? REM sleep beh sx? Endorsed problems sleeping before starting Seroquel; he now complains of severe fatigue during the day.  Visual illusions/hallucinations? Denied  Appetite/Nutrition/Weight changes: Denied   Current mood: Irritable and agitated  Behavioral disturbance/Personality change: Behavioral disturbance in the form of verbal and physical aggression, impulsivity, and lack of remorse.   Suicidal Ideation/Intention: Denied   Psychiatric History: History of depression, anxiety, other MH disorder: Denied receiving any formal diagnosis in the past.  History of MH treatment: Denied  History of SI: Denied  Substance Abuse/Dependence: He endorsed longstanding history of regular alcohol and intermittent cocaine use; has used methamphetamine on occasion. He admitted to using cocaine (e.g., around 0.25 g) prior to his CVA on 06/04/19.  He currently drinks around 12 beers per day and smokes 1-2 packs of cigarettes daily. He smoked THC for around 5 years during adolescence. No other substances were reported. He denied any history of formal treatment.   Relevant Legal History: He has received 2 DUIs (e.g., 1st happened 20+ years ago; 2nd around 6 years ago). He was arrested sometime in October 2020 for assault due to an altercation with a customer. He was bailed out of jail after around 5 days and currently has a pending case.   Social History: Born/Raised: [place], [place]. Raised by both parents (deceased) alongside sister and brother. He has no contact with his  sister and his brother passed away in 05/28/2018; this is a source of great distress.  Education: Dropped out in 10th grade.  Occupational history: Maintenance and Architect Marital history: Divorced in 05/28/06  Children: 2 (52 yr old daughter works as a Animal nutritionist; 26 year old son works as a Building control surveyor)  Medical History: Past Medical History:  Diagnosis Date  . Cancer [facility]) 2010/05/28   colon cancer  . Closed fracture of left distal radius   . GERD (gastroesophageal reflux disease)    Current Medications:  Outpatient Encounter Medications as of 10/22/2019  Medication Sig  . Accu-Chek Softclix Lancets lancets Use as instructed  . acetaminophen (TYLENOL) 325 MG tablet Take 2 tablets (650 mg total) by mouth every 4 (four) hours as needed for mild pain (or temp > 37.5 C (99.5 F)).  Marland Kitchen aspirin 81 MG chewable tablet Chew 1 tablet (81 mg total) by mouth daily.  . Blood Glucose Monitoring Suppl (ACCU-CHEK AVIVA CONNECT) w/Device KIT Test blood sugar 3 times daily  . glucose blood (ACCU-CHEK AVIVA PLUS) test strip Use as instructed  . Lancets Misc. (ACCU-CHEK SOFTCLIX LANCET DEV) KIT Test blood sugar 3 times daily  . metFORMIN (GLUCOPHAGE) 500 MG tablet Take 0.5 tablets (250 mg total) by mouth daily with breakfast.  . QUEtiapine (SEROQUEL) 25 MG tablet Take 1 tablet (25 mg total) by mouth at bedtime.  . rosuvastatin (CRESTOR) 20 MG tablet Take 1 tablet (20 mg total) by mouth daily.  . sertraline (ZOLOFT) 50 MG tablet Take 1 tablet (50 mg total) by mouth at bedtime.  . ticagrelor (BRILINTA) 90 MG TABS tablet Take 1 tablet (90 mg total) by mouth 2 (two) times daily.   No facility-administered encounter medications on file as of 10/22/2019.   Behavioral Observations:   Appearance: Disheveled with poor hygiene.  Gait: Ambulated independently, no gross abnormalities observed Speech: Dysarthric; normal rate, rhythm and  volume. Mild word finding difficulty. Thought process: Linear, goal directed, and concrete.   Affect: Blunted yet tense  Interpersonal: Aggressive, domineering, irritable, limited empathy.  60 minutes spent face-to-face with patient completing neurobehavioral status exam. 60 minutes spent integrating medical records/clinical data and completing this report. [id number] unit; [id number].  TESTING: There is medical necessity to proceed with neuropsychological assessment as the results will be used to aid in differential diagnosis and clinical decision-making and to inform specific treatment recommendations. Per the patient, informant report, and medical records reviewed, there has been a significant change in cognitive and motor functioning and a reasonable suspicion of behavioral disturbance after recent stroke  Clinical Decision Making: In considering the patient's current level of functioning, level of presumed impairment, nature of symptoms, emotional and behavioral responses during the interview, level of literacy, and observed level of motivation, a battery of tests was selected and will be administered to patient   PLAN: The patient will return to complete the above referenced full battery of neuropsychological testing this provider during two separate 2-hour testing appointments. Education regarding testing procedures was provided to the patient. Subsequently, the patient will see this provider for a follow-up session at which time his test performances and my impressions and treatment recommendations will be reviewed in detail.   Evaluation ongoing; full report to follow.

## 2019-10-23 MED FILL — QUETIAPINE FUMARATE 25 MG T: 25 | 30 days supply | Qty: 30 | Fill #2

## 2019-10-23 NOTE — Telephone Encounter (Signed)
Any update?

## 2019-10-24 ENCOUNTER — Encounter: Payer: Self-pay | Admitting: Psychology

## 2019-10-24 MED ORDER — FLUOXETINE HCL 20 MG PO CAPS
20.0000 mg | ORAL_CAPSULE | Freq: Every day | ORAL | 3 refills | Status: DC
Start: 2019-10-24 — End: 2020-11-05

## 2019-10-24 NOTE — Telephone Encounter (Signed)
Returned phone call to Amy (SO) as requested by [employer].  She is concerned regarding continued behaviors such as frequent outbursts, agitation, delusions and lack of empathy.  She does report improvement of sleep after initiating Seroquel but concerned regarding worsening behaviors since initiating sertraline 25 mg daily.  She also reports he has returned back to drinking approximately 12 beers per day.  No prior diagnosis of psych disorders that are known but she does report having some of these behaviors prior to his stroke but have since worsened.  She did have recent evaluation by neuropsychology on 10/22/2019 and plans on neurocognitive evaluation on 11/05/2019  Recommend discontinuing sertraline and switching to fluoxetine 20 mg daily Continue Seroquel 25 mg nightly at this time but will consider decreasing dose or switching medications if behaviors continue or continues to experience increased fatigue May need to consider referral to psychiatry  Advised to not hesitate to call 911 if there is any concern for safety for herself or family members or himself.

## 2019-10-25 NOTE — Telephone Encounter (Signed)
Received approval for seroquel 25mg  tbs (90#) approved 10-22-19 thru 10-21-20. PA # [id number]. [place].

## 2019-11-05 ENCOUNTER — Other Ambulatory Visit: Payer: Self-pay

## 2019-11-05 ENCOUNTER — Encounter: Payer: Medicaid Other | Admitting: Psychology

## 2019-11-05 ENCOUNTER — Encounter: Payer: Self-pay | Admitting: Psychology

## 2019-11-05 DIAGNOSIS — R4586 Emotional lability: Secondary | ICD-10-CM | POA: Diagnosis not present

## 2019-11-05 DIAGNOSIS — F0151 Vascular dementia with behavioral disturbance: Secondary | ICD-10-CM | POA: Diagnosis not present

## 2019-11-05 DIAGNOSIS — F0391 Unspecified dementia with behavioral disturbance: Secondary | ICD-10-CM

## 2019-11-05 NOTE — Progress Notes (Addendum)
   Neuropsychology Note  Scott Day completed 120 minutes of neuropsychological testing with this provider.  BEHAVIORAL OBSERVATIONS Scott Day was polite and cooperative with the examiner. He was dressed casually and adequately groomed. He was alert and partially oriented (correct year, month, and day of week but not date). Speech was slightly dysarthric but mostly fluent and with no errors or difficulty noted and receptive language was intact. He was appropriate and friendly. Movement appeared normal. Thought processes were logical and with no unusual content.  He took a 5-10 minute break to smoke a cigarette outside. He appeared motivated and gave good effort. He persisted well overall and was cooperative with all assigned tasks. His mood appeared depressed and anxious with congruent affect.   TESTS ADMINISTERED: Animal Naming Beck Depression Inventory, 2nd edition (BDI-II) Controlled Oral Word Association (COWA) Grooved Pegboard Hand Dynamometer  Neuropsychological Assessment Battery (NAB), Judgement  Repeatable Battery for the Assessment of Neuropsychological Status (RBANS), Form A Trail Making Test (Part A & B) Wide Range Achievement Test, 5th Edition (WRAT-5), Word Reading    Scott Day will return to complete testing on 11/12/19 at 10:00am with this provider.  The patient understands he can contact our office should he require our assistance before this time.  Full report to follow.

## 2019-11-06 ENCOUNTER — Ambulatory Visit: Payer: Medicaid Other | Admitting: Adult Health

## 2019-11-06 ENCOUNTER — Encounter: Payer: Self-pay | Admitting: Adult Health

## 2019-11-06 VITALS — BP 141/84 | HR 74 | Ht 69.0 in | Wt 233.0 lb

## 2019-11-06 DIAGNOSIS — E538 Deficiency of other specified B group vitamins: Secondary | ICD-10-CM | POA: Diagnosis not present

## 2019-11-06 DIAGNOSIS — E119 Type 2 diabetes mellitus without complications: Secondary | ICD-10-CM | POA: Diagnosis not present

## 2019-11-06 DIAGNOSIS — I10 Essential (primary) hypertension: Secondary | ICD-10-CM

## 2019-11-06 DIAGNOSIS — F0630 Mood disorder due to known physiological condition, unspecified: Secondary | ICD-10-CM | POA: Diagnosis not present

## 2019-11-06 DIAGNOSIS — I69398 Other sequelae of cerebral infarction: Secondary | ICD-10-CM

## 2019-11-06 DIAGNOSIS — E785 Hyperlipidemia, unspecified: Secondary | ICD-10-CM

## 2019-11-06 DIAGNOSIS — R7989 Other specified abnormal findings of blood chemistry: Secondary | ICD-10-CM | POA: Diagnosis not present

## 2019-11-06 DIAGNOSIS — I639 Cerebral infarction, unspecified: Secondary | ICD-10-CM

## 2019-11-06 NOTE — Patient Instructions (Addendum)
Continue current medications until completion of testing and then may decide if further changes are required  Will repeat lab work today to follow up on elevated levels during hospitalization   Contact cardiology Dr. Caryl Comes for placement of loop recorder  Continue aspirin 81 mg daily and Brilinta (ticagrelor) 90 mg bid  and lipitor  for secondary stroke prevention  Continue to follow up with PCP regarding cholesterol and blood pressure management  Maintain strict control of hypertension with blood pressure goal below 130/90, diabetes with hemoglobin A1c goal below 6.5% and cholesterol with LDL cholesterol (bad cholesterol) goal below 70 mg/dL.     Followup in the future with me in 4 months or call earlier if needed       Thank you for coming to see [place] at [facility]. I hope we have been able to provide you high quality care today.  You may receive a patient satisfaction survey over the next few weeks. We would appreciate your feedback and comments so that we may continue to improve ourselves and the health of our patients.      Emotional Health After Stroke Your emotional health may change after a stroke. You may have fear, anxiety, anger, sadness, and other feelings. Some of these changes happen because a stroke can damage your brain and nervous system. You may also have these feelings because coping with a change in your health can feel overwhelming. Depression and other emotional changes can slow your recovery after a stroke. It is important to recognize the symptoms so that you can take steps to strengthen your emotional health. What are some common emotions after a stroke? You may have:  Fear.  Anxiety.  Anger.  Frustration.  Sadness.  Feelings of loss or grief.  Depression.  Crying or laughing at the wrong time or wrong situation (pseudobulbar affect, orPBA). What are the symptoms of depression? Depression after a stroke can happen right away,  or it can show up later. Symptoms of depression may include:  Sleep problems.  Changes in normal eating habits, such as eating too much or too little.  Weight gain or weight loss.  Not having energy or enthusiasm (lethargy).  Feeling very tired (fatigue).  Avoiding people and activities (social withdrawal).  Irritability.  Crying more than usual.  Mood swings.  Not being able to concentrate.  Feeling hopeless.  Hating yourself.  Having suicidal thoughts. If you ever feel like you may hurt yourself or others, or have thoughts about taking your own life, get help right away. You can go to your nearest emergency department or call:  Your local emergency services (911 in the [place]  A suicide crisis helpline, such as the [place] at 603-103-0234. This is open 24 hours a day. What increases my risk of depression? Having a stroke raises the risk of depression. The risk also goes up if you:  Are socially isolated.  Have a family history of depression.  Have a history of depression or other mental health problems before the stroke.  Are unable to work or do activities that you previously enjoyed.  Need help from others for daily activities.  Use drugs or drink alcohol.  Take certain medicines, such as sleeping pills or high blood pressure medicines. What are some coping methods I can use? Ask your health care provider for help. Your health care provider may recommend treatments for depression, such as:  Talk therapy or counseling with a mental health professional. This may include cognitive  behavioral therapy (CBT) to help change your patterns of thinking.  Medical therapies, such as brain stimulation or light therapy.  Lifestyle changes, such as eating a healthy diet and avoiding alcohol.  Antidepressant medicines.  Alternative therapies, such as acupuncture, music therapy, or pet therapy. Other coping strategies you may try  include:  Physical therapy or exercises. Try to do some exercise every day.  Writing your thoughts in a journal. An example might be keeping track of unrealistic thoughts or keeping a log of things you are thankful for.  Practicing good sleep habits, such as getting up at the same time every day.  Following a predictable routine each day.  Participating in activities that make you laugh.  Mindfulness therapy. This may include meditation and other techniques to lower your stress.  Joining a support group for people who are recovering from a stroke. These groups provide social interaction and help you feel connected to others. Your health care team can help you find a support group in your area. Summary  Your emotional health may change after a stroke. You may have fear, anxiety, anger, sadness, and other feelings.  It is important to recognize the symptoms of depression and other emotional problems.  If you experience emotional changes, let your loved ones know and contact your health care provider. This information is not intended to replace advice given to you by your health care provider. Make sure you discuss any questions you have with your health care provider. Document Revised: 02/18/2017 Document Reviewed: 06/11/2016 Elsevier Patient Education  [place].

## 2019-11-06 NOTE — Progress Notes (Signed)
[facility] [address] [place]. [place] [address] [phone]       STROKE FOLLOW UP NOTE  [name] Date of Birth:  [birth year] Medical Record Number:  [id number]   Reason for Referral: stroke follow up    SUBJECTIVE:   CHIEF COMPLAINT:  Chief Complaint  Patient presents with  . Follow-up    rm 5, with SO, residual stroke deficits stable without worsening     HPI:   Today, 11/06/2019, Scott Day is being seen for stroke follow-up accompanied by his SO, Amy.   Residual deficits of cognitive impairment with behaviors, left-sided weakness, occasional swallowing difficulty, dysarthria and mild headaches Greatest concern today of SO is in regards to neurocognitive disorder with behaviors consisting of frequent outbursts, agitation, irritation, delusions and lack of empathy.  Also reports increased alcohol intake of approximately 12 beers per day. SO reported increased fatigue and depression after initiating sertraline therefore discontinued and switched to fluoxetine 20 mg daily on 8/4.  She is unable to appreciate benefit thus far. Continues Seroquel 25 mg nightly tolerating well and assist with sleeping and prior insomnia.  Completed first half of neurocognitive eval yesterday and plans on completing on Monday. Experiences headache every other day and temples bilaterally typically towards the afternoon.  At times can be debilitating where he will lay down and headache will subside after approximately 1 hour.  Denies photophobia, phonophobia or N/V.  Denies new or worsening stroke/TIA symptoms  Continues aspirin 81 mg daily and Brilinta without bleeding or bruising Plans on cerebral angiogram with Dr. Estanislado Pandy next month for surveillance monitoring post stent Continues on Crestor 20 mg daily without myalgias Blood pressure today 141/84 Glucose levels stable with recent A1c 6.3  No further concerns     History provided for reference purposes  only Initial visit 07/23/2019 JM: Scott Day is being seen for hospital follow-up accompanied by his daughter.  Residual deficits mild dysphagia, visual impairment, cognitive impairment, and left sided neglect.  Daughter also endorses mood changes such as increased agitation, lack of patience and short temper.  No prior history of depression or anxiety.  Due to lack of insurance, outpatient therapies currently on hold but continues to do HEP with ongoing improvement.   Follow-up with cardiology with plans on pursuing loop recorder placement once insurance is confirmed as currently Medicaid pending.  Continues on aspirin 325 mg daily and Brilinta 90 mg twice daily without bleeding or bruising.  Scheduled follow-up with IR 09/04/2019 for surveillance monitoring of right M1 stent.  Self discontinued atorvastatin due to myalgias with resolution after discontinuing.  Blood pressure today 140/70.  Has scheduled appointment with community health and wellness in the near future to establish care.  Hypercoagulable labs showed slightly elevated homocysteine level and positive ANA with negative lupus anticoagulant panel, beta-2 glycoprotein and cardiolipin antibodies.  No further concerns at this time.  Stroke admission 06/02/2019: Scott E Barleyis a 59 y.o.malewith history of colon cancer in 2012 presented to [facility] on 06/04/2019 with L sided weakness.  CTA w/ R M1 occlusion w/ penumbra. Transferred to [facility] for IR.  Evaluated by stroke team with stroke work-up revealing right MCA territory infarct with right M1 distal occlusion s/p IR with right M1 stent placement, embolic secondary to unknown source.  Initially, IR mechanical thrombectomy with TICI 2C revascularization and dissection flap at site of occlusion with cerebral angio with reocclusion therefore right M1 stent placed with TICI 3 flow and  advised to follow-up with IR outpatient.  Recommended consideration of  loop recorder placement outpatient due to lack of insurance.  Also recommended pursuing hypercoagulable labs outpatient.  Discharged on aspirin 325 mg daily and Brilinta 90 mg twice daily for secondary stroke prevention and stent placement.  No history of HTN.  LDL 126 initiate atorvastatin 80 mg daily.  New diagnosis of DM with A1c 8.3 and recommended PCP follow-up outpatient.  Other stroke risk factors include EtOH use and obesity but no prior history of stroke.  Evaluated by therapies with residual deficits of mild dysarthria, right gaze preference, left hemianopsia versus neglect, left facial droop and dysphagia and recommended discharge to CIR for ongoing therapy needs.  Stroke: R MCA territory infarct with right M1 distal occlusion s/p IR with R M1 stent placement, embolic secondary to unknown source  Code Stroke CT head No acute abnormality. ASPECTS 10.   CTA head &neck nonocclusive distal R M1 thrombus into bifurcation. Neck ok  CT perfusion 101m core, 1225mterritory at risk  IR mid/distal R M1 occlusion.Mechanical thrombectomy w/ TICI2c revascularization. Dissection flap at site of occlusion. Cerebral angio w/ reocclusion. R M1 stent placed w/ TICI3 flow. Treated w/ Integrilin gtt.   Post IR CT no hemorrhage. pathcy cortical/subcortical R MCA infarct (frontal operculum, insula, temporoparietal jxn). Sinus dz.   Not able to have MRI due to left ankle brace  CT head repeat 3/16 no ICH  LE Dopplerno DVT  2D EchoEF 65-70%. No source of embolus   TEE completed, no PFO, EF 65-70%  Loop will be considered as outpt as pt is uninsured at this time.   Hypercoagulable labs: out pt f/u  UDS neg  LDL126  HgbA1c8.3  SCDs for VTE prophylaxis  No antithromboticprior to admission, now on aspirin 325 mg daily and Brilinta (ticagrelor) 90 mg bidfollowing Brilinta load. Continue on discharge  Therapy recommendations: CIR       ROS:   14 system review of systems  performed and negative with exception of see HPI  PMH:  Past Medical History:  Diagnosis Date  [facility]   [facility]  . Closed fracture of left distal radius   . GERD (gastroesophageal reflux disease)     PSH:  Past Surgical History:  Procedure Laterality Date  . BUBBLE STUDY  06/08/2019   Procedure: BUBBLE STUDY;  Surgeon: ChBuford DresserMD;  [facility];  Service: Cardiovascular;;  . COLON SURGERY  2004   colon cancer  . IR CT HEAD LTD  06/04/2019  . IR CT HEAD LTD  06/04/2019  . IR INTRA CRAN STENT  06/04/2019  . IR PERCUTANEOUS ART THROMBECTOMY/INFUSION INTRACRANIAL INC DIAG ANGIO  06/04/2019  . IR [place] ACCESS RIGHT  06/04/2019  . OPEN REDUCTION INTERNAL FIXATION (ORIF) DISTAL RADIAL FRACTURE Left 09/20/2017   Procedure: OPEN REDUCTION INTERNAL FIXATION (ORIF) DISTAL RADIAL FRACTURE;  Surgeon: KuLeanora CoverMD;  [facility] Service: [facility]ics;  Laterality: Left;  . RADIOLOGY WITH ANESTHESIA N/A 06/04/2019   Procedure: IR WITH ANESTHESIA;  Surgeon: Radiologist, Medication, MD;  [facility] Radiology;  Laterality: N/A;  . TEE WITHOUT CARDIOVERSION N/A 06/08/2019   Procedure: TRANSESOPHAGEAL ECHOCARDIOGRAM (TEE);  Surgeon: ChBuford DresserMD;  [facility];  Service: Cardiovascular;  Laterality: N/A;    Social History:  Social History   Socioeconomic History  . Marital status: Divorced    Spouse name: Not on file  . Number of children: Not on  file  . Years of education: Not on file  . Highest education level: Not on file  Occupational History  . Not on file  Tobacco Use  . Smoking status: Current Every Day Smoker    Packs/day: 1.00    Start date: 07/20/2019  . Smokeless tobacco: Never Used  . 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 Vaping Use: Former  Substance and Sexual Activity  . Alcohol use: Yes    Alcohol/week: 24.0 standard drinks     Types: 24 Cans of beer per week    Comment: 12 to 24 beers daily  . Drug use: Never  . Sexual activity: Not on file  Other Topics Concern  . Not on file  Social History Narrative  . Not on file   Social Determinants of Health   Financial Resource Strain:   . Difficulty of Paying Living Expenses:   Food Insecurity:   . Worried About Charity fundraiser in the Last Year:   . Arboriculturist in the Last Year:   Transportation Needs:   . Film/video editor (Medical):   Marland Kitchen Lack of Transportation (Non-Medical):   Physical Activity:   . Days of Exercise per Week:   . Minutes of Exercise per Session:   Stress:   . Feeling of Stress :   Social Connections:   . Frequency of Communication with Friends and Family:   . Frequency of Social Gatherings with Friends and Family:   . Attends Religious Services:   . Active Member of Clubs or Organizations:   . Attends Archivist Meetings:   Marland Kitchen Marital Status:   Intimate Partner Violence:   . Fear of Current or Ex-Partner:   . Emotionally Abused:   Marland Kitchen Physically Abused:   . Sexually Abused:     Family History: No family history on file.  Medications:   Current Outpatient Medications on File Prior to Visit  Medication Sig Dispense Refill  . Accu-Chek Softclix Lancets lancets Use as instructed 100 each 12  . acetaminophen (TYLENOL) 325 MG tablet Take 2 tablets (650 mg total) by mouth every 4 (four) hours as needed for mild pain (or temp > 37.5 C (99.5 F)).    Marland Kitchen aspirin 81 MG chewable tablet Chew 1 tablet (81 mg total) by mouth daily.    . Blood Glucose Monitoring Suppl (ACCU-CHEK AVIVA CONNECT) w/Device KIT Test blood sugar 3 times daily 1 kit 0  . FLUoxetine (PROZAC) 20 MG capsule Take 1 capsule (20 mg total) by mouth daily. 90 capsule 3  . glucose blood (ACCU-CHEK AVIVA PLUS) test strip Use as instructed 100 each 12  . ibuprofen (ADVIL) 600 MG tablet Take 600 mg by mouth every 6 (six) hours as needed.    . Lancets Misc.  (ACCU-CHEK SOFTCLIX LANCET DEV) KIT Test blood sugar 3 times daily 1 kit 0  . metFORMIN (GLUCOPHAGE) 500 MG tablet Take 0.5 tablets (250 mg total) by mouth daily with breakfast. 30 tablet 3  . QUEtiapine (SEROQUEL) 25 MG tablet Take 1 tablet (25 mg total) by mouth at bedtime. 90 tablet 1  . rosuvastatin (CRESTOR) 20 MG tablet Take 1 tablet (20 mg total) by mouth daily. 30 tablet 12  . ticagrelor (BRILINTA) 90 MG TABS tablet Take 1 tablet (90 mg total) by mouth 2 (two) times daily. 60 tablet 1   No current facility-administered medications on file prior to visit.    Allergies:  No  Known Allergies    OBJECTIVE:  Physical Exam  Vitals:   11/06/19 1527  BP: (!) 141/84  Pulse: 74  Weight: 233 lb (105.7 kg)  Height: _0  (1.753 m)   Body mass index is 34.41 kg/m. No exam data present  General: well developed, well nourished,  pleasant middle-age Caucasian male, seated, in no evident distress Head: head normocephalic and atraumatic.   Neck: supple with no carotid or supraclavicular bruits Cardiovascular: regular rate and rhythm, no murmurs Musculoskeletal: no deformity Skin:  no rash/petichiae Vascular:  Normal pulses all extremities   Neurologic Exam Mental Status: Awake and fully alert. Mild dysarthria. Oriented to place and time. Recent and remote memory impaired. Attention span, concentration and fund of knowledge moderately appropriate but SO continued to provide majority of information.  Mood and affect appropriate.  Cranial Nerves: Pupils equal, briskly reactive to light. Extraocular movements full without nystagmus. Visual fields full to confrontation. Hearing intact. Facial sensation intact.  Mild left lower facial weakness Motor: Normal bulk and tone. Normal strength in all tested extremity muscles except slightly decreased left hand grip. Sensory.: intact to touch , pinprick , position and vibratory sensation.  Coordination: Rapid alternating movements normal in all  extremities except slightly decreased left hand. Finger-to-nose and heel-to-shin performed accurately bilaterally. Gait and Station: Arises from chair without difficulty. Stance is normal. Gait demonstrates normal stride length and balance without use of assistive device Reflexes: 1+ and symmetric. Toes downgoing.       ASSESSMENT: Scott Day is a 59 y.o. year old male presented with left-sided weakness on 06/04/2019 with stroke work-up revealing right MCA territory infarct with right M1 distal occlusion s/p IR with right M1 stent placement, embolic secondary to unknown source.  Recommended consideration of loop recorder placement and hypercoagulable labs outpatient.  Vascular risk factors include HLD, new dx DM, former tobacco use and EtOH use.      PLAN:  1. Right MCA stroke s/p IR:  a. Residual deficits: Neurocognitive impairment with behaviors and dysarthria.continue fluoxetine 20 mg daily and Seroquel 25 mg nightly.  They recommend further changes based on neurocognitive testing results due to some symptoms consistent with frontotemporal dementia b. Continue aspirin 325 mg daily and Brilinta (ticagrelor) 90 mg bid  and Crestor 20 mg daily for secondary stroke prevention.  c. Right M1 stent placement: Plans on repeat cerebral angiogram next month.  Ongoing duration of Brilinta will be determined by IR d. Advised to contact cardiology to discuss loop recorder placement as he is now Medicaid approved e. Ensure close PCP follow-up for aggressive stroke risk factor management 2. HTN: BP goal<130/90.  Stable.  Managed by PCP 3. HLD: LDL goal <70.  Intolerant to atorvastatin due to myalgias.  Continue Crestor 20 mg daily and follow-up with PCP for ongoing prescribing, monitoring and management 4. New dx DMII: A1c goal <7.0.  Managed by PCP 5. Possible sleep apnea: Discussed with patient and SO potential underlying sleep apnea which could contribute to daytime fatigue, snoring, tension  headaches, insomnia and nocturia.  Due to his current cognitive state, would recommend holding off on sleep study as he would likely not tolerate CPAP treatment if this was indicated. SO in agreement 6. Elevated homocystine: Repeat lab work and treat as indicated    Follow up in 4 months or call earlier if needed   I spent 45 minutes of face-to-face and non-face-to-face time with patient and SO. This included previsit chart review, lab review, study review, order entry, electronic  health record documentation, patient education regarding recent stroke, residual deficits with large discussion around cognitive impairment and behaviors, importance of managing stroke risk factors and answered all questions to patient and daughters satisfaction    Frann Rider, [facility]  [facility] [address] [place], [place] [address]  Phone [phone] Fax [phone] Note: This document was prepared with digital dictation and possible smart phrase technology. Any transcriptional errors that result from this process are unintentional.

## 2019-11-07 LAB — VITAMIN B12: Vitamin B-12: 428 pg/mL (ref 232–1245)

## 2019-11-07 LAB — HOMOCYSTEINE: Homocysteine: 12 umol/L (ref 0.0–14.5)

## 2019-11-08 ENCOUNTER — Telehealth: Payer: Self-pay

## 2019-11-08 NOTE — Telephone Encounter (Signed)
LM on the VM for the patient to call back re: documentation below.

## 2019-11-08 NOTE — Telephone Encounter (Signed)
-----   Message from Frann Rider, NP sent at 11/08/2019  3:41 PM EDT ----- Please advise patient that recent lab work showed satisfactory homocystine level and normal B12 level but on the lower end and may consider discussing supplementation with PCP

## 2019-11-09 NOTE — Progress Notes (Signed)
I agree with the above plan 

## 2019-11-12 ENCOUNTER — Encounter: Payer: Self-pay | Admitting: Psychology

## 2019-11-12 ENCOUNTER — Encounter (HOSPITAL_BASED_OUTPATIENT_CLINIC_OR_DEPARTMENT_OTHER): Payer: Medicaid Other | Admitting: Psychology

## 2019-11-12 ENCOUNTER — Other Ambulatory Visit: Payer: Self-pay

## 2019-11-12 DIAGNOSIS — F0391 Unspecified dementia with behavioral disturbance: Secondary | ICD-10-CM

## 2019-11-12 DIAGNOSIS — F0151 Vascular dementia with behavioral disturbance: Secondary | ICD-10-CM | POA: Diagnosis not present

## 2019-11-12 DIAGNOSIS — R4586 Emotional lability: Secondary | ICD-10-CM | POA: Diagnosis not present

## 2019-11-12 NOTE — Progress Notes (Addendum)
   Neuropsychology Note  Scott Day completed another 120 minutes of neuropsychological testing with this provider.   BEHAVIORAL OBSERVATIONS Scott Day was polite and cooperative with the examiner. He was dressed casually and adequately groomed. He was alert and partially oriented (correct year, month, and day of week but not date). Speech was slightly dysarthric but mostly fluent and with no errors or difficulty noted and receptive language was intact. He was appropriate and friendly. Movement appeared normal. Thought processes were logical and with no unusual content.  He took a 5-10 minute break to smoke a cigarette outside. He appeared motivated and gave good effort. He persisted well overall and was cooperative with all assigned tasks. His mood appeared depressed and anxious with congruent affect.   TESTS ADMINISTERED:  Functional Assessment   Wechsler Adult Intelligence Scale, 4th Edition (WAIS-IV), Select subtests    Wechsler Memory Scale, Spatial Span    Scott Day will return to complete testing on 11/12/19 at 10:00am with this provider.  The patient understands he can contact our office should he require our assistance before this time.  Full report to follow.

## 2019-11-14 ENCOUNTER — Encounter (HOSPITAL_BASED_OUTPATIENT_CLINIC_OR_DEPARTMENT_OTHER): Payer: Medicaid Other | Admitting: Psychology

## 2019-11-14 ENCOUNTER — Other Ambulatory Visit: Payer: Self-pay

## 2019-11-14 DIAGNOSIS — F0391 Unspecified dementia with behavioral disturbance: Secondary | ICD-10-CM

## 2019-11-14 DIAGNOSIS — F0151 Vascular dementia with behavioral disturbance: Secondary | ICD-10-CM | POA: Diagnosis not present

## 2019-11-14 DIAGNOSIS — R4586 Emotional lability: Secondary | ICD-10-CM | POA: Diagnosis not present

## 2019-11-20 ENCOUNTER — Other Ambulatory Visit: Payer: Self-pay

## 2019-11-20 ENCOUNTER — Encounter: Payer: Medicaid Other | Admitting: Psychology

## 2019-11-20 DIAGNOSIS — R4586 Emotional lability: Secondary | ICD-10-CM | POA: Diagnosis not present

## 2019-11-20 DIAGNOSIS — F0151 Vascular dementia with behavioral disturbance: Secondary | ICD-10-CM

## 2019-11-20 DIAGNOSIS — F0391 Unspecified dementia with behavioral disturbance: Secondary | ICD-10-CM

## 2019-11-22 ENCOUNTER — Encounter: Payer: Self-pay | Admitting: Psychology

## 2019-11-22 NOTE — Progress Notes (Addendum)
[facility]    Scott Ellis, PsyD,  Clinical Neuropsychologist [address] [place], [place]  [address] Phone: [phone] Fax: [phone]   [employer]! If you are not the patient, the patient's legal guardian/caregiver, or an individual for whom the patient has signed a release of information then do not proceed and contact the above mentioned provider. Thank you!   PATIENT:    Scott Day DATE OF BIRTH:   1961/02/04 PROCEDURE:  Neuropsychological evaluation  DATE OF SERVICE:   11/14/2019 DATE(S) OF TESTING: 11/05/19, 11/12/19 REFERRAL SOURCE:  Scott Day of [place]:  To evaluate cognitive and emotional functioning in light of recent stroke and inform treatment and management of the patient (i.e., start or continue rehab or pharmacological therapy)  SOURCES OF INFORMATION: The following information was gathered from a clinical interview with patient, his longtime friend and current partner, and from a review of available medical records. The patient expressed understanding of the purpose of the evaluation and consented to all procedures.    HISTORY OF PRESENT ILLNESS:  Scott Day is a 59 y.o. 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  Residual deficits include mild dysphagia,visual impairment, cognitive impairment,andleft sidedneglect  Reviewing medical records, it is apparent that the patient has continued with a lack of insight as to some of the residual deficits from his recent stroke; there are significant concerns about behavioral disturbance in the form of agitation, anger, and  aggression. He is described as labile with a tendency to become very angry and threaten physical violence at times. He is described as showing little remorse or awareness during these anger outbursts and is extremely hard to redirected or calm down. The patient has difficulty when plans are changed quickly;He gets easily overwhelmed.    The patient's longtime friend and partner provided a great deal of information and concerns. She has reportedly noticed significant inattention and short-term Scott issues, dysarthric speech and changes to expressive language,  information processing speed difficulties increased agitation and frustration, anger outbursts, lack of motivation, confusion and disorientation, lack of safety awareness, slowed processing speed, motor weakness, and executive dysfunction (e.g., impulsivity, emotion dysregulation, disinhibition, inflexibility, etc.). She also described patient as having problems performing basic daily tasks including basic self care; he may not take a shower for several days in a row.   The patient does express realistic concerns about his ability to work in the future and live independently. When asked about possible return to work, the patient acknowledged current physical and cognitive limitations and identified several reasonable aspects of his last job that would be negatively impacted. 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   When asked about driving, he admitted to currently driving short distances in his car or motorcycle on regular basis and appeared resistant to avoiding these activities in the future.  The patient became very angry and frustrated at the suggestion that he would be told not to drive or that this privilege could be taken away if he persisted against this  restriction. The  patient thinks he is fine driving.  BEHAVIORAL OBSERVATIONS:   Scott Day was appropriately dressed for season and situation and appeared tidy and adequately groomed. Stature and height were unremarkable. Patient appeared well-nourished and chronological age. Sensory and motor abilities appeared normal. Patient was friendly and rapport was established. Speech was as expected. The patient was able to understand test directions. Mood was euthymic and affect was mood congruent. Attention and motivation were good. Insight was intact.  Optimal test taking conditions were maintained.  VALIDITY OF EVALUATION: Scores on objective and embedded measures of performance validity were within normal limits, and there were no behavioral manifestations that suggested suboptimal effort or poor test engagement. As such, the following test results are considered valid and interpretable.  Mental Status: The patient was alert and fully oriented to person, place, time, and situation.  Attention and concentration were as expected.  Fund of information was typical.  Thinking was goal-directed and appeared normal from the perspective of productivity, relevance, and coherence with no preoccupations. The patient was able to form concepts well.  Judgment and decision-making appeared intact.  Insight was full.    TEST RESULTS:   A detailed score summary is provided within the Scores Summary Table at the end of this report, presented as standardized scores obtained through comparisons of the patient's performance to that of same age peers taking into account (whenever possible) the effects of age, education, and other demographic factors.   Baseline Intellectual Abilities: Performance on a measure of word reading combined with demographic information yielded a statistically-derived estimate of premorbid intellectual functioning.  The patient's premorbid abilities are estimated to be in the below average to average  range.    Scott Day was given a neuropsychological screening battery (RBANS), which contains 12 subtests covering five neuropsychological domains. His total scale score on that battery, a composite of performance across tests and estimate of global cognitive functioning, was 76, which was borderline impaired for his age (5th percentile). Performance on individual tests and domain scores are provided below.   Attention, Processing Speed, and Executive Cognitive Processes: Behaviorally, he showed signs of social inappropriateness, cognitive rigidity, impulsivity, perseveration, and some set-loss.   Mr. Baranowski RBANS Attention Index score of 82 was below average for his age (12th percentile). His performance on a test of auditory attention (RBANS Digit Span) was average (50th percentile) and he repeated up to 6 digits in order. On another test of auditory attention (WAIS-IV Digit Span), he repeated up to four digits backwards and in numerical sequence (16th percentile).  His WAIS-IV Working Scott Index score (WMI) of 89 places his performance in the below average range (23rd percentile). Visual attention (WMS-III Spatial Span) was below average (9th percentile) on initial assessment but improved to average (50th percentile) when limits were tested.   Processing speed was variable overall and ranged from the moderately impaired to average range on various measures. His WAIS-IV Processing Speed Index (PSI) was mildly impaired for his age and significantly weaker than his overall cognitive functioning,. A test of processing speed with visual scanning (RBANS Coding) was moderately impaired (2nd percentile) whereas he performed below average on a similar measure (WAIS-IV Coding=9th percentile). A test of processing speed with visual scanning and sequencing (Trails A) was average for his age, gender, education, and ethnicity (25th percentile). When a switching component was added (Trails B), Mr. Serano  performance remained average without errors.   The patient's ability to identify, maintain, and shift problem solving strategies in response to examiner  feedback was severely impaired and notable for numerous perseverative errors.   On a test of "real-world" judgment in which Mr. Macha was asked about health and safety situations and concepts (NAB Judgment), his score was average for his age, gender, and education (percentile), showing adequate judgment and responses to situations along with adequate explanation of reasoning.  Language Functions: The tone and prosody of Mr. Bua speech were mostly normal, with no clear errors or and receptive language was intact. Fluency was relatively impaired and diarthric at times. His RBANS Language Index score of 79 was borderline impaired for his age (8th  percentile). Confrontation naming (RBANS Picture Naming) was average (51st-75th percentile). Category fluency (RBANS Semantic Fluency) was severely impaired (1st percentile). Another test of category fluency Geologist, engineering) was average for his age, gender, education, and ethnicity (<1st percentile). Letter fluency (COWA) was borderline impaired (5th percentile) and notable for several repetitions (n=5) and intrusions (n=3). Verbal abstract reasoning was below average (WAIS-IV Similarities (16th percentile).   Visuospatial and Constructional Abilities: Visuospatial ability was a relative strength. Mr. Puccinelli RBANS Visuospatial Index score of 87 was below average for his age (19th percentile). Copy of a geometric figure (RBANS Figure Copy) was average (25th percentile) and visuoperception (RBANS Line Orientation) was average (26th to 50th percentile). Nonverbal abstract reasoning was below average (WAIS-IV Matrix Reasoning, 9th percentile). Performance on a visuoconstructional task was below average (WAIS-IVBlock Design=9th percentile)   Learning and Scott:  Mr. Cress RBANS Immediate Scott Index score of  78 was borderline impaired for his age (7th percentile). Immediate recall for a list of 10 words read to him 4 times (RBANS List Learning) was below average (9th  percentile). Immediate recall for a story read 2 times (RBANS Story Scott) was also below average (9th percentile).   Mr. Kosik RBANS Delayed Scott Index score of 78 was below average and equal to his Immediate Scott Index score. Spontaneous recall for the list of words read to him after delay (RBANS List Recall) was below average to average (4 words, 17-25 percentile) and yes/no recognition for the words (RBANS List Recognition) was severely impaired (16/20, <=2nd percentile). Delayed recall for the story [facility] Story Recall) was average (25th percentile). Spontaneous recall for the copied figure (RBANS Figure Recall) scored high end of the average range (75th  percentile).   Motor Functioning: A significant difference was noted in grip strength between dominant and nondominant hand with the latter more impacted (Right hand=49 Kg, Left Hand=30 Kg)  Rating Scale(s): The patient's score on a depression scale suggests that this individual has been experiencing a moderate. degree of depressive symptoms in the past two weeks.  The patient's score on an anxiety inventory suggests that this individual has been experiencing a moderate degree of anxious symptoms in the past week.     SUMMARY & IMPRESSION:  Mr. Harr performance on cognitive tests appeared generally valid. On a battery of neuropsychological tests, he showed mild impairment overall. Performance was relatively intact for more basic attention, language, visuospatial abilities, and Scott. However, he showed relative weakness in processing speed and impairment in some executive functions.   Behaviorally he showed signs of executive dysfunction including impulsivity and perseveration. He showed some limited insight into his weaknesses and consequences of his decisions but  performance on a judgment task was relatively intact.  Mr. Brinkmeyer pattern and severity of cognitive impairment is consistent with the extent of his acquired injury. His functioning is likely further exacerbated by substance abuse. He showed signs of  impulsivity and risk-taking even before his accident, although his stroke likely worsened this to a very large degree and is likely the largest contributor to his current behavioral presentation. Substance abuse exacerbates this as well. He appears to have a basic understanding of concepts, which suggests intact cognitive decision-making capacity for most decisions. However, his personal decision making has been quite risky, likely due to either impulsivity or lack of valuing safety, rather than cognitive inability to understand his risks. Similarly, he expresses awareness that certain behaviors (like exposing himself to people) are inappropriate, but lack of motivation to avoid this at the time. My sense is that he does have a lack of motivation in addition to difficulty controlling his behaviors.   Mr. Wiseman autonomy and ability to make decisions needs to be respected but if he places himself in immediate danger then this can be revoked (e.g. involuntary hospitalization if he is in danger due to neglecting his needs or otherwise endangering himself directly). His current behaviors are risky but do not seem to be of such immediate danger to warrant his hospitalization at this point.  Guardianship of him seems justified given his level of needs and history of poor decision-making and difficulty with instrumental activities of daily living and even some basic activities of daily living. He currently has a girlfriend who is able to manage his access to substances and ensure at least a low level of safety and care, and his daughter is helping out significantly. However, this situation is tenuous. If this girlfriend moves out or becomes unable to look out for Mr.  Mcmichen, a higher level of care could be needed. However, Mr. Liddy is not likely to buy in to this and would be at risk for trying to leave the home or doing other risky behaviors.    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 Follow-up with Scott Day  . Considering the noted medical history, it is recommended that the patient strictly comply with prescribed medical treatments for cerebrovascular risk factors (e.g., high cholesterol, high blood pressure, sleep apnea, diabetes). . Considering the noted medical history, the patient is at increased risk for progression of cognitive impairment. Therefore, it is recommended that the patient aggressively manage any modifiable risk factors for further cognitive decline such as strict compliance with prescribed medical treatments for any cerebrovascular risk factors (e.g., high cholesterol, high blood pressure, sleep apnea, diabetes). . Non-pharmacological treatments that may help motor and physical mobility:  - Exercise and stretching - Speech therapy - Balance-related exercises - Occupational therapy  2. This individual appears to be experiencing clinical symptoms of depression and anxiety.  As such, engaging in psychotherapy should be considered to better manage mood symptomology. Therein the patient can work on identifying triggers for depression and anxiety as well as work towards developing healthy coping mechanisms and implementing lifestyle modifications that can help to support ongoing mental health.    3. Continued assistance with certain activities of daily living (e.g., medication and financial management) is recommended.    4. It is recommended that the severity of the patient's impairments is considered when assessing the level of asset management required. For example, given impairment higher-level reasoning and  problem-solving skills, it is recommended that the patient consult with a family member or another trusted advisor prior to making any important medical, legal, or financial decisions. Establishing or continuing to rely upon someone who has Power of [place] for  financial management and medical decision-making is recommended.    5. The patient should limit or refrain from driving, as deficits noted on testing could affect one's ability to safely operate a motor vehicle.  At minimum, it is recommended that this person undergo a formal driving evaluation. Could contact [place] at: [phone].     6. The cognitive and/or psychiatric symptoms noted put the patient at a significant vocational disadvantage. As such, it may be beneficial to contact a vocational rehabilitation service. They can assist with finding employment opportunities that are within a person's capabilities, or teach ways to perform work-related activities more effectively. This person may also require more intense retraining of procedures he has already learned.   7. The patient is encouraged to attend to lifestyle factors for brain health (e.g., regular physical exercise, good nutrition habits, regular participation in cognitively-stimulating activities, and general stress management techniques), which are likely to have benefits for both emotional adjustment and cognition.  In fact, in addition to promoting general good health, regular exercise incorporating aerobic activities (e.g., brisk walking, jogging, bicycling, etc.) has been demonstrated to be a very effective treatment for depression and stress, with similar efficacy rates to both antidepressant medication and psychotherapy. And for those with orthopedic issues, water aerobics may be particularly beneficial.  8. Activities that have therapeutic value and might be useful to keep you cognitively stimulated:  https://www.[facility].org/get-to-know-barrow/centers-programs/neurorehabilitation-center/neuro-rehab-apps-and-games/  9. Neuropsychological re-evaluation is recommended as needed to monitor treatment efficacy, to assist with the management of the patient (start or continue rehab or pharmacological therapy), to determine any clinical and functional significance of brain abnormality over time, as well as to document any potential improvement or decline in cognitive functioning. Lastly, any follow-up testing will help delineate the specific cognitive basis of any new functional complaints.  10. Try to keep in mind that common word finding errors are not necessarily the start of a dreadful decline. Over-focusing on these errors can contribute to further distraction and emotions that can detract from effective retrieval of words; this in turn, can lead to greater distress and more difficulties with recalling the specific word you were looking for to begin with.   The following are several strategies that may help:  . Performance will generally be best in a structured, routine, and familiar environment, as opposed to situations involving complex problems.  Marlene Lard a place to keep your keys, wallet, cell phone, and other personal belongings. . Take time to register and process information to be remembered. Deeper encoding of information can be gained by forming a mental picture, making meaningful associations, connecting new information to previously learned and related information, paraphrasing and repetition.  . To the extent possible, multitasking should be avoided; break down tasks into smaller steps to help get started and to keep from feeling overwhelmed. And if there are difficulties in organization and planning, maintaining a daily organizer to help keep track of important appointments and information may be beneficial.   . Scott problems may at least be minimally addressed using compensatory  strategies such as the use of a daily schedule to follow, memos, portable recorder, a centrally located bulletin board, or Scott notebook. A large calendar, placed in a highly visible location would be valuable to keep track of dates and appointments.  In addition, it would be helpful to keep a log of all of medical appointments with the name of the doctor, date of visit, diagnoses, and treatments.  . Use of a medication box is recommended  to ensure compliance and decrease confusion regarding medication dosages, times, and dates. . To aid in managing problems with attention, the patient may consider using some of the following strategies: o The patient should simplify tasks.  There may be a need to break overly complex activities into simple step-by-step tasks, keep these steps written down in a note book and then check them off as they are completed which will help to stay on task and make sure the whole task is finished.  o The patient should set deadlines for everything, even for seemingly small tasks, prioritize time-sensitive tasks and write down every assignment, message, or important thought. o The patient is encouraged to use timers and alarms to stay on track and take breaks at regular intervals. Avoid piles of paperwork or procrastination by dealing with each item as it comes in.   If you have any questions, please contact us at [phone].   This report is intended solely for the confidential review and use by the referring professional to assist in diagnostic and medical decision making needs.  This report should not be released to a third party without proper consent. [NOTE: data can be made available to qualified professionals with permission from the patient or legal representative/caregiver]    ____________________________________ Jan Fireman, PsyD,  Licensed Psychologist (Provisional) Clinical Neuropsychologist

## 2019-11-26 ENCOUNTER — Encounter: Payer: Self-pay | Admitting: Psychology

## 2019-11-26 NOTE — Progress Notes (Signed)
Jeneen Rinks Riviere returned for a feedback appointment today to review the results of his recent neuropsychological evaluation with this provider. His daughter and current partner were also in present for the appointment.   60 minutes face-to-face time was spent reviewing his test results, my impressions and my recommendations as detailed in his report. The patient, daughter, and current partner were given the opportunity to ask questions, and I did my best to answer these to their satisfaction. We scheduled 4 follow up therapy sessions (1 x week) to target emotional and behavioral concerns (i.e., depression, anxiety, anger, and substance abuse) after recent stroke. Below you will find a summary of his test results, my clinical impressions, and recommendations. The full neuropsychological report can be found in his chart dated 11/14/19.    SUMMARY & IMPRESSION:  Mr. Kehoe performance on cognitive tests appeared generally valid. On a battery of neuropsychological tests, he showed mild impairment overall. Performance was relatively intact for more basic attention, language, visuospatial abilities, and memory. However, he showed relative weakness in processing speed and impairment in some executive functions. Behaviorally he showed signs of executive dysfunction including impulsivity and perseveration. He showed some limited insight into his weaknesses and consequences of his decisions but performance on a judgment task was relatively intact.  Mr. Huss pattern and severity of cognitive impairment is consistent with the extent of his acquired injury. His functioning is likely further exacerbated by substance abuse. He showed signs of impulsivity and risk-taking even before his accident, although his stroke likely worsened this to a very large degree and is likely the largest contributor to his current behavioral presentation. Substance abuse exacerbates this as well. He appears to have a basic understanding of  concepts, which suggests intact cognitive decision-making capacity for most decisions. However, his personal decision making has been quite risky, likely due to either impulsivity or lack of valuing safety, rather than cognitive inability to understand his risks. Similarly, he expresses awareness that certain behaviors (like exposing himself to people) are inappropriate, but lack of motivation to avoid this at the time. My sense is that he does have a lack of motivation in addition to difficulty controlling his behaviors.   Mr. Carchi autonomy and ability to make decisions needs to be respected but if he places himself in immediate danger then this can be revoked (e.g. involuntary hospitalization if he is in danger due to neglecting his needs or otherwise endangering himself directly). His current behaviors are risky but do not seem to be of such immediate danger to warrant his hospitalization at this point.  Guardianship of him seems justified given his level of needs and history of poor decision-making and difficulty with instrumental activities of daily living and even some basic activities of daily living. He currently has a girlfriend who is able to manage his access to substances and ensure at least a low level of safety and care, and his daughter is helping out significantly. However, this situation is tenuous. If this girlfriend moves out or becomes unable to look out for Mr. Meech, a higher level of care could be needed. However, Mr. Creswell is not likely to buy in to this and would be at risk for trying to leave the home or doing other risky behaviors.    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 Follow-up with Dr. Posey Pronto  . Considering the noted medical history, it is recommended that the patient strictly comply  with prescribed medical treatments for cerebrovascular risk  factors (e.g., high cholesterol, high blood pressure, sleep apnea, diabetes). . Considering the noted medical history, the patient is at increased risk for progression of cognitive impairment. Therefore, it is recommended that the patient aggressively manage any modifiable risk factors for further cognitive decline such as strict compliance with prescribed medical treatments for any cerebrovascular risk factors (e.g., high cholesterol, high blood pressure, sleep apnea, diabetes). . Non-pharmacological treatments that may help motor and physical mobility:  - Exercise and stretching - Speech therapy - Balance-related exercises - Occupational therapy  2. This individual appears to be experiencing clinical symptoms of depression and anxiety.  As such, engaging in psychotherapy should be considered to better manage mood symptomology. Therein the patient can work on identifying triggers for depression and anxiety as well as work towards developing healthy coping mechanisms and implementing lifestyle modifications that can help to support ongoing mental health.    3. Continued assistance with certain activities of daily living (e.g., medication and financial management) is recommended.    4. It is recommended that the severity of the patient's impairments is considered when assessing the level of asset management required. For example, given impairment higher-level reasoning and problem-solving skills, it is recommended that the patient consult with a family member or another trusted advisor prior to making any important medical, legal, or financial decisions. Establishing or continuing to rely upon someone who has Power of Musician and medical decision-making is recommended.    5. The patient should limit or refrain from driving, as deficits noted on testing could affect one's ability to safely operate a motor vehicle.  At minimum, it is recommended that this person undergo a formal  driving evaluation. Could contact [place] at: [phone].     6. The cognitive and/or psychiatric symptoms noted put the patient at a significant vocational disadvantage. As such, it may be beneficial to contact a vocational rehabilitation service. They can assist with finding employment opportunities that are within a person's capabilities, or teach ways to perform work-related activities more effectively. This person may also require more intense retraining of procedures he has already learned.   7. The patient is encouraged to attend to lifestyle factors for brain health (e.g., regular physical exercise, good nutrition habits, regular participation in cognitively-stimulating activities, and general stress management techniques), which are likely to have benefits for both emotional adjustment and cognition.  In fact, in addition to promoting general good health, regular exercise incorporating aerobic activities (e.g., brisk walking, jogging, bicycling, etc.) has been demonstrated to be a very effective treatment for depression and stress, with similar efficacy rates to both antidepressant medication and psychotherapy. And for those with orthopedic issues, water aerobics may be particularly beneficial.  8. Activities that have therapeutic value and might be useful to keep you cognitively stimulated: https://www.[facility].org/get-to-know-barrow/centers-programs/neurorehabilitation-center/neuro-rehab-apps-and-games/  9. Neuropsychological re-evaluation is recommended as needed to monitor treatment efficacy, to assist with the management of the patient (start or continue rehab or pharmacological therapy), to determine any clinical and functional significance of brain abnormality over time, as well as to document any potential improvement or decline in cognitive functioning. Lastly, any follow-up testing will help delineate the specific cognitive basis of any new functional  complaints.  10. Try to keep in mind that common word finding errors are not necessarily the start of a dreadful decline. Over-focusing on these errors can contribute to further distraction and emotions that can detract from effective retrieval of words; this  in turn, can lead to greater distress and more difficulties with recalling the specific word you were looking for to begin with.   The following are several strategies that may help:  . Performance will generally be best in a structured, routine, and familiar environment, as opposed to situations involving complex problems.  Marlene Lard a place to keep your keys, wallet, cell phone, and other personal belongings. . Take time to register and process information to be remembered. Deeper encoding of information can be gained by forming a mental picture, making meaningful associations, connecting new information to previously learned and related information, paraphrasing and repetition.  . To the extent possible, multitasking should be avoided; break down tasks into smaller steps to help get started and to keep from feeling overwhelmed. And if there are difficulties in organization and planning, maintaining a daily organizer to help keep track of important appointments and information may be beneficial.   . Memory problems may at least be minimally addressed using compensatory strategies such as the use of a daily schedule to follow, memos, portable recorder, a centrally located bulletin board, or memory notebook. A large calendar, placed in a highly visible location would be valuable to keep track of dates and appointments.  In addition, it would be helpful to keep a log of all of medical appointments with the name of the doctor, date of visit, diagnoses, and treatments.  . Use of a medication box is recommended to ensure compliance and decrease confusion regarding medication dosages, times, and dates. . To aid in managing problems with attention, the  patient may consider using some of the following strategies: o The patient should simplify tasks.  There may be a need to break overly complex activities into simple step-by-step tasks, keep these steps written down in a note book and then check them off as they are completed which will help to stay on task and make sure the whole task is finished.  o The patient should set deadlines for everything, even for seemingly small tasks, prioritize time-sensitive tasks and write down every assignment, message, or important thought. o The patient is encouraged to use timers and alarms to stay on track and take breaks at regular intervals. Avoid piles of paperwork or procrastination by dealing with each item as it comes in.  If you have any questions, please contact us at [phone].   This report is intended solely for the confidential review and use by the referring professional to assist in diagnostic and medical decision making needs.  This report should not be released to a third party without proper consent. [NOTE: data can be made available to qualified professionals with permission from the patient or legal representative/caregiver]    ____________________________________ Jan Fireman, PsyD,  Licensed Psychologist (Provisional) Clinical Neuropsychologist

## 2019-11-27 ENCOUNTER — Encounter: Payer: Medicaid Other | Attending: Physical Medicine & Rehabilitation | Admitting: Physical Medicine & Rehabilitation

## 2019-11-27 ENCOUNTER — Other Ambulatory Visit: Payer: Self-pay

## 2019-11-27 ENCOUNTER — Encounter: Payer: Self-pay | Admitting: Physical Medicine & Rehabilitation

## 2019-11-27 VITALS — BP 150/89 | HR 77 | Temp 98.1°F | Ht 69.0 in | Wt 232.0 lb

## 2019-11-27 DIAGNOSIS — G8194 Hemiplegia, unspecified affecting left nondominant side: Secondary | ICD-10-CM

## 2019-11-27 DIAGNOSIS — E119 Type 2 diabetes mellitus without complications: Secondary | ICD-10-CM | POA: Diagnosis not present

## 2019-11-27 DIAGNOSIS — I69319 Unspecified symptoms and signs involving cognitive functions following cerebral infarction: Secondary | ICD-10-CM

## 2019-11-27 DIAGNOSIS — Z720 Tobacco use: Secondary | ICD-10-CM

## 2019-11-27 DIAGNOSIS — R21 Rash and other nonspecific skin eruption: Secondary | ICD-10-CM

## 2019-11-27 DIAGNOSIS — I63511 Cerebral infarction due to unspecified occlusion or stenosis of right middle cerebral artery: Secondary | ICD-10-CM

## 2019-11-27 DIAGNOSIS — R4586 Emotional lability: Secondary | ICD-10-CM | POA: Insufficient documentation

## 2019-11-27 DIAGNOSIS — F17200 Nicotine dependence, unspecified, uncomplicated: Secondary | ICD-10-CM | POA: Diagnosis not present

## 2019-11-27 DIAGNOSIS — I69354 Hemiplegia and hemiparesis following cerebral infarction affecting left non-dominant side: Secondary | ICD-10-CM

## 2019-11-27 NOTE — Progress Notes (Signed)
Subjective:    Patient ID: Scott Day, male    DOB: Sep 08, 1960, 59 y.o.   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    Significant other supplements history. He was last seen in clinic on 10/02/19.  Since that time, pt has been following with Neuropsych, notes reviewed - tests administered, counseling recommended.  He continues to follow up with Neuro. BP remains elevated. He was released by therapies.  Denies falls. He sees Cards in 2 weeks. BP is elevated, but controlled at home. CBGs are slightly elevated. He continues to smoke 1-2 PPD. He is drinking alcohol again. He is planning on follow up for OSA.  Pain Inventory Average Pain 5 Pain Right Now 5 My pain is intermittent, dull and aching  LOCATION OF PAIN  Head  BOWEL Number of stools per week: 7-14 Oral laxative use No  Type of laxative none Enema or suppository use No  History of colostomy No  Incontinent No   BLADDER Normal In and out cath, frequency  Able to self cath No  Bladder incontinence No  Frequent urination Yes  Leakage with coughing No  Difficulty starting stream No  Incomplete bladder emptying No    Mobility walk without assistance how many minutes can you walk? 20  Function not employed: date last employed . I need assistance with the following:  meal prep, household duties and shopping  Neuro/Psych bladder control problems confusion depression anxiety  Prior Studies Any changes since last visit?  no  Physicians involved in your care Any changes since last visit?  no   History reviewed. No pertinent family history. Social History   Socioeconomic History  . Marital status: Divorced    Spouse name: Not on file  . Number of children: Not on file  . Years of education: Not on file  . Highest education level: Not on file   Occupational History  . Not on file  Tobacco Use  . Smoking status: Current Every Day Smoker    Packs/day: 1.00    Start date: 07/20/2019  . Smokeless tobacco: Never Used  . 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 Vaping Use: Former  Substance and Sexual Activity  . Alcohol use: Yes    Alcohol/week: 24.0 standard drinks    Types: 24 Cans of beer per week    Comment: 12 to 24 beers daily  . Drug use: Never  . Sexual activity: Not on file  Other Topics Concern  . Not on file  Social History Narrative  . Not on file   Social Determinants of Health   Financial Resource Strain:   . Difficulty of Paying Living Expenses: Not on file  Food Insecurity:   . Worried About Charity fundraiser in the Last Year: Not on file  . Ran Out of Food in the Last Year: Not on file  Transportation Needs:   . Lack of Transportation (Medical): Not on file  . Lack of Transportation (Non-Medical): Not on file  Physical Activity:   . Days of Exercise per Week: Not on file  . Minutes of Exercise per Session: Not on file  Stress:   . Feeling of Stress : Not on file  Social Connections:   . Frequency of Communication with Friends and Family: Not on file  . Frequency  of Social Gatherings with Friends and Family: Not on file  . Attends Religious Services: Not on file  . Active Member of Clubs or Organizations: Not on file  . Attends Archivist Meetings: Not on file  . Marital Status: Not on file   Past Surgical History:  Procedure Laterality Date  . BUBBLE STUDY  06/08/2019   Procedure: BUBBLE STUDY;  Surgeon: Buford Dresser, MD;  Location: [facility] ENDOSCOPY;  Service: Cardiovascular;;  . COLON SURGERY  2004   colon cancer  . IR CT HEAD LTD  06/04/2019  . IR CT HEAD LTD  06/04/2019  . IR INTRA CRAN STENT  06/04/2019  . IR PERCUTANEOUS ART THROMBECTOMY/INFUSION INTRACRANIAL INC DIAG ANGIO  06/04/2019  . IR US GUIDE VASC ACCESS RIGHT  06/04/2019  .  OPEN REDUCTION INTERNAL FIXATION (ORIF) DISTAL RADIAL FRACTURE Left 09/20/2017   Procedure: OPEN REDUCTION INTERNAL FIXATION (ORIF) DISTAL RADIAL FRACTURE;  Surgeon: Leanora Cover, MD;  Location: [facility];  Service: Orthopedics;  Laterality: Left;  . RADIOLOGY WITH ANESTHESIA N/A 06/04/2019   Procedure: IR WITH ANESTHESIA;  Surgeon: Radiologist, Medication, MD;  Location: [facility];  Service: Radiology;  Laterality: N/A;  . TEE WITHOUT CARDIOVERSION N/A 06/08/2019   Procedure: TRANSESOPHAGEAL ECHOCARDIOGRAM (TEE);  Surgeon: Buford Dresser, MD;  Location: [facility] ENDOSCOPY;  Service: Cardiovascular;  Laterality: N/A;   Past Medical History:  Diagnosis Date  . Cancer [facility]) 2012   colon cancer  . Closed fracture of left distal radius   . GERD (gastroesophageal reflux disease)    BP (!) 150/89   Pulse 77   Temp 98.1 F (36.7 C)   Ht 5\' 9"  (1.753 m)   Wt 232 lb (105.2 kg)   SpO2 96%   BMI 34.26 kg/m   Opioid Risk Score:   Fall Risk Score:  `1  Depression screen PHQ 2/9  Depression screen [facility] 2/9 11/27/2019 08/21/2019 08/21/2019 06/26/2019  Decreased Interest 0 3 0 0  Down, Depressed, Hopeless 0 3 0 0  PHQ - 2 Score 0 6 0 0  Altered sleeping - - - 0  Tired, decreased energy - - - 0  Change in appetite - - - 0  Feeling bad or failure about yourself  - - - 0  Trouble concentrating - - - 3  Moving slowly or fidgety/restless - - - 1  Suicidal thoughts - - - 0  PHQ-9 Score - - - 4  Difficult doing work/chores - - - Not difficult at all   Review of Systems  Constitutional: Negative.   HENT: Negative.   Eyes: Negative.   Respiratory: Negative.   Cardiovascular: Negative.   Gastrointestinal: Negative.   Endocrine: Negative.   Genitourinary: Negative.   Musculoskeletal: Negative.   Skin: Negative.   Allergic/Immunologic: Negative.   Neurological: Positive for dizziness and weakness.  Hematological: Negative.   Psychiatric/Behavioral: Positive for agitation, confusion and  dysphoric mood. The patient is nervous/anxious.   All other systems reviewed and are negative.      Objective:   Physical Exam  Constitutional: NAD. Vital signs reviewed. HENT: Normocephalic.  Atraumatic. Eyes: EOMI. No discharge. Cardiovascular: NoJVD. Respiratory: Normal effort.  No stridor. GI: Non-distended. Skin: Warm and dry.  Intact. Bulls eye on right arm.  Psych: Delayed Musc: No edema in extremities.  No tenderness in extremities. 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     1.  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              Continue to follow up with Neurology             Released from therapies per patient             Follow up with Cards regarding Loop, appointment in 2 weeks  Discussed gradual return to driving   2.  Hypertension.               Elevated today, significant other states she will check at home   3.  New findings of diabetes mellitus.               Relatively controlled at present   Follow up with PCP  4.  Tobacco abuse/Etoh abuse             Encouraged abstinence again, educated on smoking cessation again, smoking more now             Encouraged follow up with PCP regarding abortive options again  States he will quit smoking by next visit   5. Mood instability - exacerbated by stressors with anger             Follow up on med recs             Continue to monitor             Continue CBT  6. ?OSA  Recommend follow up with PCP, plans on scheduling appointment  7. Bulls eye rash  Recommended follow up with PCP

## 2019-12-03 ENCOUNTER — Encounter: Payer: Self-pay | Admitting: Adult Health

## 2019-12-07 NOTE — Progress Notes (Deleted)
Cardiology Office Note Date:  12/07/2019  Patient ID:  Scott Day, DOB [birth year], MRN [id number] PCP:  Charlott Rakes, MD  Cardiologist:  Dr. Caryl Comes (new at the hospital)    Chief Complaint: *** planned to discuss loop  History of Present Illness: Scott Day is a 59 y.o. male with history of colon cancer only prior the stroke in March.   E comes in today to be seen for Dr. Caryl Comes, last seen at the consultation to discuss loop post stroke. Unfortunately at the time was uninsured, he wanted to proceed with loop, though planned to wait until insurance came through. Discharged from rehab 06/15/2019.  I saw him 07/09/19 He is accompanied by his daughter today.  He wanted to keep the appointment to revisit the loop rational and procedure.  He has completed all the paperwork for medicaid and disability and were told everything was in order and hopefully will hear back confirming coverage soon. He remains with some residual L sided deficit/neglet, but PT is helping significantly.  He has had some increased emotional response to the stroke of late as well, though has great family support. No CP, palpitations, no SOB, no near syncope or syncope. We revisited the rational and role of loop in the setting of cryptogenic stroke They were going to follow up once insurance was in place.  *** symptoms *** insurance *** risks/benefit, monitoring   Past Medical History:  Diagnosis Date  . Cancer [facility]) 2012   colon cancer  . Closed fracture of left distal radius   . GERD (gastroesophageal reflux disease)     Past Surgical History:  Procedure Laterality Date  . BUBBLE STUDY  06/08/2019   Procedure: BUBBLE STUDY;  Surgeon: Buford Dresser, MD;  Location: [facility] ENDOSCOPY;  Service: Cardiovascular;;  . COLON SURGERY  2004   colon cancer  . IR CT HEAD LTD  06/04/2019  . IR CT HEAD LTD  06/04/2019  . IR INTRA CRAN STENT  06/04/2019  . IR PERCUTANEOUS ART THROMBECTOMY/INFUSION INTRACRANIAL  INC DIAG ANGIO  06/04/2019  . IR US GUIDE VASC ACCESS RIGHT  06/04/2019  . OPEN REDUCTION INTERNAL FIXATION (ORIF) DISTAL RADIAL FRACTURE Left 09/20/2017   Procedure: OPEN REDUCTION INTERNAL FIXATION (ORIF) DISTAL RADIAL FRACTURE;  Surgeon: Leanora Cover, MD;  Location: [place];  Service: Orthopedics;  Laterality: Left;  . RADIOLOGY WITH ANESTHESIA N/A 06/04/2019   Procedure: IR WITH ANESTHESIA;  Surgeon: Radiologist, Medication, MD;  Location: [place];  Service: Radiology;  Laterality: N/A;  . TEE WITHOUT CARDIOVERSION N/A 06/08/2019   Procedure: TRANSESOPHAGEAL ECHOCARDIOGRAM (TEE);  Surgeon: Buford Dresser, MD;  Location: [facility] ENDOSCOPY;  Service: Cardiovascular;  Laterality: N/A;    Current Outpatient Medications  Medication Sig Dispense Refill  . Accu-Chek Softclix Lancets lancets Use as instructed 100 each 12  . acetaminophen (TYLENOL) 325 MG tablet Take 2 tablets (650 mg total) by mouth every 4 (four) hours as needed for mild pain (or temp > 37.5 C (99.5 F)).    Marland Kitchen aspirin 81 MG chewable tablet Chew 1 tablet (81 mg total) by mouth daily.    . Blood Glucose Monitoring Suppl (ACCU-CHEK AVIVA CONNECT) w/Device KIT Test blood sugar 3 times daily 1 kit 0  . FLUoxetine (PROZAC) 20 MG capsule Take 1 capsule (20 mg total) by mouth daily. 90 capsule 3  . glucose blood (ACCU-CHEK AVIVA PLUS) test strip Use as instructed 100 each 12  . ibuprofen (ADVIL) 600 MG tablet Take 600 mg by mouth every  6 (six) hours as needed.    . Lancets Misc. (ACCU-CHEK SOFTCLIX LANCET DEV) KIT Test blood sugar 3 times daily 1 kit 0  . metFORMIN (GLUCOPHAGE) 500 MG tablet Take 0.5 tablets (250 mg total) by mouth daily with breakfast. 30 tablet 3  . QUEtiapine (SEROQUEL) 25 MG tablet Take 1 tablet (25 mg total) by mouth at bedtime. 90 tablet 1  . rosuvastatin (CRESTOR) 20 MG tablet Take 1 tablet (20 mg total) by mouth daily. 30 tablet 12  . ticagrelor (BRILINTA) 90 MG TABS tablet Take 1 tablet (90 mg total)  by mouth 2 (two) times daily. 60 tablet 1   No current facility-administered medications for this visit.    Allergies:   Patient has no known allergies.   Social History:  The patient  reports that he has been smoking. He started smoking about 4 months ago. He has been smoking about 1.00 pack per day. He has never used smokeless tobacco. He reports current alcohol use of about 24.0 standard drinks of alcohol per week. He reports that he does not use drugs.   Family History:  The patient's negative for cardiac  ROS:  Please see the history of present illness.  All other systems are reviewed and otherwise negative.   PHYSICAL EXAM:  VS:  There were no vitals taken for this visit. BMI: There is no height or weight on file to calculate BMI. Well nourished, well developed, in no acute distress  HEENT: normocephalic, atraumatic  Neck: no JVD, carotid bruits or masses Cardiac:  *** RRR; no significant murmurs, no rubs, or gallops Lungs:  *** CTA b/l, no wheezing, rhonchi or rales  Abd: soft, nontender, obese MS: no deformity or *** atrophy Ext: *** no edema  Skin: warm and dry, no rash Neuro:  No gross deficits appreciated Psych: euthymic mood, full affect    EKG:  Done today and reviewed by myself: ***   06/08/2019: TEE LEFT VENTRICLE: EF = 65%. No regional wall motion abnormalities. RIGHT VENTRICLE: Normal size and function.  LEFT ATRIUM: No thrombus/mass. LEFT ATRIAL APPENDAGE: No thrombus/mass.  RIGHT ATRIUM: No thrombus/mass. AORTIC VALVE:  Trileaflet. No regurgitation. No vegetation. MITRAL VALVE:    Normal structure. Trivial regurgitation. No vegetation. TRICUSPID VALVE: Normal structure. Trivial regurgitation. No vegetation. PULMONIC VALVE: Grossly normal structure. Trivial regurgitation. No apparent vegetation. INTERATRIAL SEPTUM: No PFO or ASD seen by color Doppler. Agitated saline contrast used, negative for intra-atrial right to left shunt. PERICARDIUM: No effusion  noted DESCENDING AORTA: Mild diffuse plaque seen CONCLUSION: No cardiac source of embolism. Negative for PFO or right to left shunt.   06/05/2019: TTE IMPRESSIONS  1. Left ventricular ejection fraction, by estimation, is 65 to 70%. The  left ventricle has normal function. The left ventricle has no regional  wall motion abnormalities. There is severe left ventricular hypertrophy.  Left ventricular diastolic parameters  were normal.  2. Right ventricular systolic function is normal. The right ventricular  size is normal.  3. The mitral valve is abnormal. Trivial mitral valve regurgitation.  4. The aortic valve is tricuspid. Aortic valve regurgitation is not  visualized. No aortic stenosis is present.  5. The inferior vena cava is normal in size with <50% respiratory  variability, suggesting right atrial pressure of 8 mmHg.   06/06/2019: LE venous US Summary: BILATERAL: - No evidence of deep vein thrombosis seen in the lower extremities, bilaterally   Recent Labs: 06/11/2019: Hemoglobin 15.1; Platelets 236 09/10/2019: ALT 26; BUN 11; Creatinine, Ser 1.00;  Potassium 4.4; Sodium 140  06/05/2019: VLDL 26 09/10/2019: Chol/HDL Ratio 3.9; Cholesterol, Total 140; HDL 36; LDL Chol Calc (NIH) 74; Triglycerides 177   CrCl cannot be calculated (Patient's most recent lab result is older than the maximum 21 days allowed.).   Wt Readings from Last 3 Encounters:  11/27/19 232 lb (105.2 kg)  11/06/19 233 lb (105.7 kg)  10/02/19 228 lb 9.6 oz (103.7 kg)     Other studies reviewed: Additional studies/records reviewed today include: summarized above  ASSESSMENT AND PLAN:  1. Cryptogenic stroke     ***  Disposition: ***  Current medicines are reviewed at length with the patient today.  The patient did not have any concerns regarding medicines.  Venetia Night, PA-C 12/07/2019 8:07 AM     [address] [place] [address] [phone]  (office)  [phone] (fax)

## 2019-12-10 ENCOUNTER — Ambulatory Visit: Payer: Medicaid Other | Admitting: Physician Assistant

## 2019-12-11 ENCOUNTER — Encounter: Payer: Medicaid Other | Admitting: Psychology

## 2019-12-11 ENCOUNTER — Other Ambulatory Visit: Payer: Self-pay

## 2019-12-11 ENCOUNTER — Encounter: Payer: Self-pay | Admitting: Psychology

## 2019-12-11 DIAGNOSIS — F0151 Vascular dementia with behavioral disturbance: Secondary | ICD-10-CM

## 2019-12-11 DIAGNOSIS — F0391 Unspecified dementia with behavioral disturbance: Secondary | ICD-10-CM

## 2019-12-11 DIAGNOSIS — E119 Type 2 diabetes mellitus without complications: Secondary | ICD-10-CM

## 2019-12-11 DIAGNOSIS — F17200 Nicotine dependence, unspecified, uncomplicated: Secondary | ICD-10-CM

## 2019-12-11 DIAGNOSIS — R4586 Emotional lability: Secondary | ICD-10-CM | POA: Diagnosis not present

## 2019-12-11 NOTE — Progress Notes (Signed)
Patient arrived on time for his first counseling appointment and was accompanied by his daughter and current roommate/partner. He  agreed to have them both present in the room for his first visit to review progress and collaborate on goals. I clarified the nature of each relationship at the onset of the visit and emphasized Mr. Iwan as the patient. His mood appeared bright and euthymic at times and then shifted to more agitated, down, and depressed. His behavior was appropriate for the most part and he did not engage in any verbal or aggressive outburst during the visit.   Patient and current partner clearly displayed some dysfunctional communication and behavioral patterns that likely contribute to reported outbursts and/or physical aggression. Per her report, he engages in verbal outbursts, mostly towards her, multiple times per day. Patient agreed to try and reduce verbal outbursts to once per day, for now. He also acknowledged the excessive nature of his outbursts and apologized to his partner/roomate.    I discussed the importance of exploring/recognizing various situations that trigger his anger may help better manage his mood. For example, if he noticed that he get more frustrated when hungry, it might be helpful to set an alarm to remind him to eat. This may help him avoid hunger and, as a result, avoid potential angry outbursts. Additional education was provided on some of the warning signs of anger (e.g., elevated HR, confusion, sweating, change in voice, etc.) and suggested that they leave the situation and find a safe place to calm down. I introduced concept of the "3 R's" to help manage agitation (e.g., Redirection, Reassurance, Reconsidering) and provided them with a printed handout to reference at home.   Patient expressed interest in meeting individually for counseling to work on emotional processing and expression, learn/build coping strategies (e.g., deep breathing, mindfulness, meditation,  progressive muscle relaxation, etc.) to manage frustration, anger, and general distress, and learn/practrice organizational and encoding strategies toimprove executive functioning. Plan is to work to find ways to avoid anger outbursts and engage in more positive and healthier behavioral practices to reduce cardiovascular risk factors and optimize recovery after stroke.   Next appointment is scheduled for 12/25/19.    Managing Agitation: 3 R's  1.  Redirection (help distract loved one by focusing their attention on something else, moving them to a new environment, or otherwise engaging them in something other than what is distressing to them)   2. Reassurance (reassure them that you are there to take care of them and that there is nothing they need to be worried about), and   3. Reconsidering (consider the situation from their perspective and try to identify if there is something about the situation or environment that may be triggering their reaction).

## 2019-12-13 NOTE — Progress Notes (Signed)
Patient Care Team: Charlott Rakes, MD as PCP - General (Family Medicine)   HPI  Scott Day is a 59 y.o. male Seen in followup for Cryptogenic Stroke now interested and insured to get loop recorder  Recovered from his stroke  The patient denies chest pain, shortness of breath, nocturnal dyspnea, orthopnea or peripheral edema.  There have been no palpitations, lightheadedness or syncope.     Date Cr K LDL Hgb  3/21 0.85 3.8 126 15.1  6/21 1.0 4.4 74      DATE TEST EF   3/21 Echo 65-70%            Records and Results Reviewed   Past Medical History:  Diagnosis Date  . Cancer [facility]) 2012   colon cancer  . Closed fracture of left distal radius   . GERD (gastroesophageal reflux disease)     Past Surgical History:  Procedure Laterality Date  . BUBBLE STUDY  06/08/2019   Procedure: BUBBLE STUDY;  Surgeon: Buford Dresser, MD;  Location: [facility] ENDOSCOPY;  Service: Cardiovascular;;  . COLON SURGERY  2004   colon cancer  . IR CT HEAD LTD  06/04/2019  . IR CT HEAD LTD  06/04/2019  . IR INTRA CRAN STENT  06/04/2019  . IR PERCUTANEOUS ART THROMBECTOMY/INFUSION INTRACRANIAL INC DIAG ANGIO  06/04/2019  . IR US GUIDE VASC ACCESS RIGHT  06/04/2019  . OPEN REDUCTION INTERNAL FIXATION (ORIF) DISTAL RADIAL FRACTURE Left 09/20/2017   Procedure: OPEN REDUCTION INTERNAL FIXATION (ORIF) DISTAL RADIAL FRACTURE;  Surgeon: Leanora Cover, MD;  Location: [place];  Service: Orthopedics;  Laterality: Left;  . RADIOLOGY WITH ANESTHESIA N/A 06/04/2019   Procedure: IR WITH ANESTHESIA;  Surgeon: Radiologist, Medication, MD;  Location: [place];  Service: Radiology;  Laterality: N/A;  . TEE WITHOUT CARDIOVERSION N/A 06/08/2019   Procedure: TRANSESOPHAGEAL ECHOCARDIOGRAM (TEE);  Surgeon: Buford Dresser, MD;  Location: [facility] ENDOSCOPY;  Service: Cardiovascular;  Laterality: N/A;    Current Meds  Medication Sig  . Accu-Chek Softclix Lancets lancets Use as instructed  .  acetaminophen (TYLENOL) 325 MG tablet Take 2 tablets (650 mg total) by mouth every 4 (four) hours as needed for mild pain (or temp > 37.5 C (99.5 F)).  Marland Kitchen aspirin 81 MG chewable tablet Chew 1 tablet (81 mg total) by mouth daily.  . Blood Glucose Monitoring Suppl (ACCU-CHEK AVIVA CONNECT) w/Device KIT Test blood sugar 3 times daily  . FLUoxetine (PROZAC) 20 MG capsule Take 1 capsule (20 mg total) by mouth daily.  Marland Kitchen glucose blood (ACCU-CHEK AVIVA PLUS) test strip Use as instructed  . ibuprofen (ADVIL) 600 MG tablet Take 600 mg by mouth every 6 (six) hours as needed.  . Lancets Misc. (ACCU-CHEK SOFTCLIX LANCET DEV) KIT Test blood sugar 3 times daily  . metFORMIN (GLUCOPHAGE) 500 MG tablet Take 0.5 tablets (250 mg total) by mouth daily with breakfast.  . QUEtiapine (SEROQUEL) 25 MG tablet Take 1 tablet (25 mg total) by mouth at bedtime.  . rosuvastatin (CRESTOR) 20 MG tablet Take 1 tablet (20 mg total) by mouth daily.  . ticagrelor (BRILINTA) 90 MG TABS tablet Take 1 tablet (90 mg total) by mouth 2 (two) times daily.    No Known Allergies    Review of Systems negative except from HPI and PMH  Physical Exam BP (!) 148/84   Pulse 64   Ht 5' 10" (1.778 m)   Wt 233 lb 9.6 oz (106 kg)   SpO2 99%  BMI 33.52 kg/m  Well developed and well nourished in no acute distress HENT normal E scleral and icterus clear Neck Supple JVP flat; carotids brisk and full Clear to ausculation Regular rate and rhythm, no murmurs gallops or rub Soft with active bowel sounds No clubbing cyanosis  Edema Alert and oriented, grossly normal motor and sensory function Skin Warm and Dry  ECG sinus @  65 18/09/39  CrCl cannot be calculated (Patient's most recent lab result is older than the maximum 21 days allowed.).   Assessment and  Plan Cryptogenic Stroke Rx w/ revascularization and stenting  \ Hypertension   For loop insertion We discussed the role of monitoring for afib in the setting of Cryptogenic  Stroke and the lack of prospective data on anticoagulation in pts with S[employer] identified on monitoring, the strong data for the role of anticoagulation in patients with known afib and antecedent stroke.   Ayan E Fleming [id number]  [id number]  Pre op Dx Cryptogenic Stroke  Post op Dx Same  Procedure  Loop Recorder implantation  After routine prep and drape of the left parasternal area, a small incision was created. A Medtronic LINQ Reveal Loop Recorder  Serial Number  [id number] was inserted.    SteriStrip dressing was  applied.  The patient tolerated the procedure without apparent complication.  EBL < 10cc       , MD 12/14/2019 9:16 AM    Current medicines are reviewed at length with the patient today .  The patient does not3 have concerns regarding medicines.  

## 2019-12-14 ENCOUNTER — Ambulatory Visit: Payer: Medicaid Other | Admitting: Internal Medicine

## 2019-12-14 ENCOUNTER — Encounter: Payer: Self-pay | Admitting: Internal Medicine

## 2019-12-14 ENCOUNTER — Other Ambulatory Visit: Payer: Self-pay

## 2019-12-14 VITALS — BP 148/84 | HR 64 | Ht 70.0 in | Wt 233.6 lb

## 2019-12-14 DIAGNOSIS — I63511 Cerebral infarction due to unspecified occlusion or stenosis of right middle cerebral artery: Secondary | ICD-10-CM

## 2019-12-14 DIAGNOSIS — I639 Cerebral infarction, unspecified: Secondary | ICD-10-CM

## 2019-12-14 NOTE — Patient Instructions (Signed)
Medication Instructions:  Your physician recommends that you continue on your current medications as directed. Please refer to the Current Medication list given to you today. *If you need a refill on your cardiac medications before your next appointment, please call your pharmacy*   Lab Work: None ordered.  If you have labs (blood work) drawn today and your tests are completely normal, you will receive your results only by: Marland Kitchen MyChart Message (if you have MyChart) OR . A paper copy in the mail If you have any lab test that is abnormal or we need to change your treatment, we will call you to review the results.   Testing/Procedures: None ordered.    Follow-Up: At [facility], you and your health needs are our priority.  As part of our continuing mission to provide you with exceptional heart care, we have created designated Provider Care Teams.  These Care Teams include your primary Cardiologist (physician) and Advanced Practice Providers (APPs -  Physician Assistants and Nurse Practitioners) who all work together to provide you with the care you need, when you need it.  We recommend signing up for the patient portal called "MyChart".  Sign up information is provided on this After Visit Summary.  MyChart is used to connect with patients for Virtual Visits (Telemedicine).  Patients are able to view lab/test results, encounter notes, upcoming appointments, etc.  Non-urgent messages can be sent to your provider as well.   To learn more about what you can do with MyChart, go to [email or web address].    Your next appointment:   As needed with Dr Caryl Comes   Other Instructions  You may shower tomorrow night with the large bandage on.   Remove the large bandage on Monday.  Allow the steri-strips to fall of on their own.   Wound Care, Adult Taking care of your wound properly can help to prevent pain, infection, and scarring. It can also help your wound to heal more quickly. How to care  for your wound Wound care      Follow instructions from your health care provider about how to take care of your wound. Make sure you: ? Wash your hands with soap and water before you change the bandage (dressing). If soap and water are not available, use hand sanitizer. ? Change your dressing as told by your health care provider. ? Leave stitches (sutures), skin glue, or adhesive strips in place. These skin closures may need to stay in place for 2 weeks or longer. If adhesive strip edges start to loosen and curl up, you may trim the loose edges. Do not remove adhesive strips completely unless your health care provider tells you to do that.  Check your wound area every day for signs of infection. Check for: ? Redness, swelling, or pain. ? Fluid or blood. ? Warmth. ? Pus or a bad smell.  Ask your health care provider if you should clean the wound with mild soap and water. Doing this may include: ? Using a clean towel to pat the wound dry after cleaning it. Do not rub or scrub the wound. ? Applying a cream or ointment. Do this only as told by your health care provider. ? Covering the incision with a clean dressing.  Ask your health care provider when you can leave the wound uncovered.  Keep the dressing dry until your health care provider says it can be removed. Do not take baths, swim, use a hot tub, or do anything that would put the wound  underwater until your health care provider approves. Ask your health care provider if you can take showers. You may only be allowed to take sponge baths. Medicines   Take over-the-counter and prescription medicines only as told by your health care provider. If you were prescribed pain medicine, take it 30 or more minutes before you do any wound care or as told by your health care provider. General instructions  Return to your normal activities as told by your health care provider. Ask your health care provider what activities are safe.  Do not  scratch or pick at the wound.  Do not use any products that contain nicotine or tobacco, such as cigarettes and e-cigarettes. These may delay wound healing. If you need help quitting, ask your health care provider.  Keep all follow-up visits as told by your health care provider. This is important.  Eat a diet that includes protein, vitamin A, vitamin C, and other nutrient-rich foods to help the wound heal. ? Foods rich in protein include meat, dairy, beans, nuts, and other sources. ? Foods rich in vitamin A include carrots and dark green, leafy vegetables. ? Foods rich in vitamin C include citrus, tomatoes, and other fruits and vegetables. ? Nutrient-rich foods have protein, carbohydrates, fat, vitamins, or minerals. Eat a variety of healthy foods including vegetables, fruits, and whole grains. Contact a health care provider if:  You received a tetanus shot and you have swelling, severe pain, redness, or bleeding at the injection site.  Your pain is not controlled with medicine.  You have redness, swelling, or pain around the wound.  You have fluid or blood coming from the wound.  Your wound feels warm to the touch.  You have pus or a bad smell coming from the wound.  You have a fever or chills.  You are nauseous or you vomit.  You are dizzy. Get help right away if:  You have a red streak going away from your wound.  The edges of the wound open up and separate.  Your wound is bleeding, and the bleeding does not stop with gentle pressure.  You have a rash.  You faint.  You have trouble breathing. Summary  Always wash your hands with soap and water before changing your bandage (dressing).  To help with healing, eat foods that are rich in protein, vitamin A, vitamin C, and other nutrients.  Check your wound every day for signs of infection. Contact your health care provider if you suspect that your wound is infected. This information is not intended to replace advice  given to you by your health care provider. Make sure you discuss any questions you have with your health care provider. Document Revised: 06/26/2018 Document Reviewed: 09/23/2015 Elsevier Patient Education  [place].

## 2019-12-20 ENCOUNTER — Telehealth: Payer: Self-pay | Admitting: Emergency Medicine

## 2019-12-20 NOTE — Telephone Encounter (Signed)
LMOM to call [facility]  , # and office hours provided. Need to determine why no transmission received from [facility].

## 2019-12-25 ENCOUNTER — Other Ambulatory Visit: Payer: Self-pay

## 2019-12-25 ENCOUNTER — Encounter: Payer: Medicaid Other | Attending: Physical Medicine & Rehabilitation | Admitting: Psychology

## 2019-12-25 DIAGNOSIS — F0391 Unspecified dementia with behavioral disturbance: Secondary | ICD-10-CM | POA: Diagnosis present

## 2019-12-25 DIAGNOSIS — F17200 Nicotine dependence, unspecified, uncomplicated: Secondary | ICD-10-CM | POA: Diagnosis present

## 2019-12-26 NOTE — Telephone Encounter (Addendum)
Spoke to patients girlfriend Amy (DPR), states the app is not connected on app. Girlfriend doesn't live with patient. Advised that the patient will need to have a home remote monitor to assure he is monitored.   Called [employer], states Relay monitors are on back order.   Called Amy, offered for her to come by the office in [place] and we will have one waiting up front to take to patient. States she will be by before 5:00 PM today.  Requested to call so we can help patient set up remote monitor. Verbalized understanding.  Carelink system updated.

## 2019-12-27 ENCOUNTER — Telehealth: Payer: Self-pay

## 2019-12-27 ENCOUNTER — Encounter: Payer: Self-pay | Admitting: Family Medicine

## 2019-12-27 ENCOUNTER — Encounter: Payer: Self-pay | Admitting: Psychology

## 2019-12-27 NOTE — Telephone Encounter (Signed)
Monitor connected in Carelink. Transmission received 12/27/19. Normal remote transmission. No episodes noted.

## 2019-12-27 NOTE — Telephone Encounter (Signed)
Spoke with pt significant other . Please accommodate an earlier appt /  Copied from [place] [phone]. Topic: General - Other >> Dec 27, 2019  2:05 PM Keene Breath wrote: Reason for CRM: Patient's relative is calling to get an appt. As soon as possible per the doctor's request.  When looking at schedule, there was nothing available earlier than November.  Please call Amy, caregiver at [phone]

## 2019-12-27 NOTE — Progress Notes (Deleted)
Subjective:    Patient ID: Scott Day is a 59 y.o. male.   Chief Complaint: HPI {Common ambulatory SmartLinks:19316} Review of Systems   The patient states that "I've been trying real hard not to yell and fuss at [facility] partner/roomate] and thinking a lot about quitting cigarettes"  Objective:  Physical Exam  Presented as euthymic but with tense posture. Facial expression was limited and general demeanor was indifferent. Affect is appropriate but flattened. Speech was normal in rate, tone, and volume with poor pronunciation; mostly coherent and spontaneous. Language skills are as expected but with mild WFD. There are no apparent signs of hallucinations, delusions, bizzare behaviors, or other indicators of psychotic process. Associations are generally intact, thinking is concrete and perseverative. Suicidal ideation was convincingly denied. Homicidal ideation or aggressive intentions were denied.   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 Given his report of having multiple verbal outbursts towards his partner/roomate per day, goal is to decrease frequency to once per day; target date 01/25/20.  Smoking cessation techniques will also be utilized to reduce current use by 50%; target date 01/25/20  This provider will also provide emotional support and encouragement to help patient focus on sources of pleasure and meaning. Interventions will be directive and goal-oriented. Progress will be monitored and documented.  Continued progress in reaching goals and partially completing homework assignments.  Recommend continuing the current intervention and short-term goals as they exist, since progress is being made and goals have not yet been met.   Next Neuropsychology visit scheduled for: 01/08/20

## 2019-12-28 NOTE — Telephone Encounter (Signed)
Patients caregiver Amy returned call and would like a call back because she doesn't want to wait until mid November. Please advise

## 2019-12-28 NOTE — Progress Notes (Signed)
Subjective:    Patient ID: Scott Day is a 59 y.o. male.   Chief Complaint: Anger and aggression, behavioral disturbance, polysubstance abuse, unemployment  The patient states that "I am working hard not to fight with [current partner] and cut down on cigarettes". He also mentioned that he  "began working part time laying tile"   Objective:  Physical Exam  Presented as euthymic but with tense posture. Facial expression was limited and general demeanor was indifferent. Affect is appropriate but flattened. Speech was normal in rate, tone, and volume with poor pronunciation; mostly coherent and spontaneous. Language skills are as expected but with mild WFD. There are no apparent signs of hallucinations, delusions, bizzare behaviors, or other indicators of psychotic process. Associations are generally intact, thinking is concrete and perseverative. Suicidal ideation was convincingly denied. Homicidal ideation or aggressive intentions were denied.   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 He current admitted to having several verbal outbursts per day mostly directed toward his partner/roomate. Goal is to reduce verbal outbursts to 1 x day; target date 01/08/20 Smoking cessation techniques will also be utilized to reduce current use by 50%; target date 01/25/20  This provider will provide emotional support and assist with adjustment after stroke. Interventions will be directive and goal-oriented. Progress will be monitored and documented.  Continued progress in reaching these goals and partially completing homework assignments. Recommend continuing the current  intervention(s) and short-term goals as they exist, since progress is being made and goals have not yet been met.   Next Neuropsychology visit scheduled for: 01/08/20  Time spent face to face with patient and coordination of care: 60 min

## 2019-12-28 NOTE — Telephone Encounter (Signed)
Returned call to Amy and LVM to return call and schedule an appt for the patient.

## 2020-01-01 ENCOUNTER — Other Ambulatory Visit (HOSPITAL_COMMUNITY): Payer: Self-pay | Admitting: Interventional Radiology

## 2020-01-01 DIAGNOSIS — I63131 Cerebral infarction due to embolism of right carotid artery: Secondary | ICD-10-CM

## 2020-01-07 ENCOUNTER — Encounter: Payer: Medicaid Other | Admitting: Psychology

## 2020-01-07 ENCOUNTER — Encounter: Payer: Self-pay | Admitting: Psychology

## 2020-01-07 ENCOUNTER — Other Ambulatory Visit: Payer: Self-pay

## 2020-01-07 ENCOUNTER — Other Ambulatory Visit: Payer: Self-pay | Admitting: Radiology

## 2020-01-07 DIAGNOSIS — F17200 Nicotine dependence, unspecified, uncomplicated: Secondary | ICD-10-CM | POA: Diagnosis not present

## 2020-01-07 DIAGNOSIS — F0391 Unspecified dementia with behavioral disturbance: Secondary | ICD-10-CM | POA: Diagnosis not present

## 2020-01-07 NOTE — Progress Notes (Signed)
Total time spent face-to-face with patient: 60 minutes  Subjective:    Patient ID: Scott Day is a 59 y.o. male.  Chief Complaint: Anger and aggression, behavioral disturbance, polysubstance abuse, unemployment HPI  "I started using my vape to cut down on smoking"    "I dont want to be angry"   Objective:  Physical Exam Psychiatric:        Attention and Perception: Perception normal. He is inattentive. He does not perceive auditory or visual hallucinations.        Mood and Affect: Mood is anxious. Mood is not elated. Affect is blunt and angry. Affect is not labile, flat, tearful or inappropriate.        Speech: He is communicative. Speech is not rapid and pressured, delayed, slurred or tangential.        Behavior: Behavior is agitated, aggressive and combative. Behavior is not withdrawn or hyperactive. Behavior is cooperative.        Thought Content: Thought content normal. Thought content is not paranoid or delusional. Thought content does not include homicidal or suicidal ideation. Thought content does not include homicidal or suicidal plan.        Cognition and Memory: He does not exhibit impaired recent memory or impaired remote memory.        Judgment: Judgment is impulsive and inappropriate.     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 He continues to curse at and/or make derogatory comments towards his current partner almost daily, which causes significant relational distress and conflict. He reportedly regrets speaking to her  in this manner but has significant trouble controlling impulses and behavior overall. We came up with a "code word" that he can say before losing control.     Motivational interviewing for smoking cessation will continue to be utilized to increase readiness to change  (Goal: reduce current use by 50%; target date 01/25/20)   Continued progress in reaching these goals and partially completing homework assignments. Recommend continuing the current intervention(s) and short-term goals as they exist, since progress is being made and goals have not yet been met.   Next Neuropsychology visit scheduled for: 01/22/20  Time spent face to face with patient and coordination of care: 60 min

## 2020-01-08 ENCOUNTER — Other Ambulatory Visit (HOSPITAL_COMMUNITY): Payer: Self-pay | Admitting: Interventional Radiology

## 2020-01-08 ENCOUNTER — Encounter: Payer: Medicaid Other | Admitting: Psychology

## 2020-01-08 ENCOUNTER — Ambulatory Visit (HOSPITAL_COMMUNITY)
Admission: RE | Admit: 2020-01-08 | Discharge: 2020-01-08 | Disposition: A | Discharge status: Home / Self Care | Payer: Medicaid Other | Source: Ambulatory Visit | Attending: Interventional Radiology | Admitting: Interventional Radiology

## 2020-01-08 DIAGNOSIS — I6601 Occlusion and stenosis of right middle cerebral artery: Secondary | ICD-10-CM | POA: Diagnosis not present

## 2020-01-08 DIAGNOSIS — F17210 Nicotine dependence, cigarettes, uncomplicated: Secondary | ICD-10-CM | POA: Diagnosis not present

## 2020-01-08 DIAGNOSIS — Z79899 Other long term (current) drug therapy: Secondary | ICD-10-CM | POA: Insufficient documentation

## 2020-01-08 DIAGNOSIS — E785 Hyperlipidemia, unspecified: Secondary | ICD-10-CM | POA: Diagnosis not present

## 2020-01-08 DIAGNOSIS — Z95828 Presence of other vascular implants and grafts: Secondary | ICD-10-CM | POA: Diagnosis not present

## 2020-01-08 DIAGNOSIS — I63131 Cerebral infarction due to embolism of right carotid artery: Secondary | ICD-10-CM

## 2020-01-08 DIAGNOSIS — Z8673 Personal history of transient ischemic attack (TIA), and cerebral infarction without residual deficits: Secondary | ICD-10-CM | POA: Diagnosis not present

## 2020-01-08 DIAGNOSIS — E119 Type 2 diabetes mellitus without complications: Secondary | ICD-10-CM | POA: Diagnosis not present

## 2020-01-08 DIAGNOSIS — Z85038 Personal history of other malignant neoplasm of large intestine: Secondary | ICD-10-CM | POA: Insufficient documentation

## 2020-01-08 DIAGNOSIS — Z7982 Long term (current) use of aspirin: Secondary | ICD-10-CM | POA: Diagnosis not present

## 2020-01-08 DIAGNOSIS — K219 Gastro-esophageal reflux disease without esophagitis: Secondary | ICD-10-CM | POA: Diagnosis not present

## 2020-01-08 HISTORY — PX: IR ANGIO INTRA EXTRACRAN SEL COM CAROTID INNOMINATE BILAT MOD SED: IMG5360

## 2020-01-08 HISTORY — PX: IR ANGIO VERTEBRAL SEL SUBCLAVIAN INNOMINATE BILAT MOD SED: IMG5366

## 2020-01-08 LAB — BASIC METABOLIC PANEL
Anion gap: 12 (ref 5–15)
BUN: 10 mg/dL (ref 6–20)
CO2: 24 mmol/L (ref 22–32)
Calcium: 9.6 mg/dL (ref 8.9–10.3)
Chloride: 103 mmol/L (ref 98–111)
Creatinine, Ser: 0.87 mg/dL (ref 0.61–1.24)
GFR, Estimated: >60 mL/min (ref >60)
Glucose, Bld: 134 mg/dL — ABNORMAL HIGH (ref 70–99)
Potassium: 3.9 mmol/L (ref 3.5–5.1)
Sodium: 139 mmol/L (ref 135–145)

## 2020-01-08 LAB — CBC
HCT: 45.8 % (ref 39.0–52.0)
Hemoglobin: 15.6 g/dL (ref 13.0–17.0)
MCH: 32.3 pg (ref 26.0–34.0)
MCHC: 34.1 g/dL (ref 30.0–36.0)
MCV: 94.8 fL (ref 80.0–100.0)
Platelets: 198 10*3/uL (ref 150–400)
RBC: 4.83 MIL/uL (ref 4.22–5.81)
RDW: 12 % (ref 11.5–15.5)
WBC: 7.2 10*3/uL (ref 4.0–10.5)
nRBC: 0 % (ref 0.0–0.2)

## 2020-01-08 LAB — GLUCOSE, CAPILLARY: Glucose-Capillary: 117 mg/dL — ABNORMAL HIGH (ref 70–99)

## 2020-01-08 LAB — APTT: aPTT: 28 seconds (ref 24–36)

## 2020-01-08 LAB — PROTIME-INR
INR: 1 (ref 0.8–1.2)
Prothrombin Time: 12.4 seconds (ref 11.4–15.2)

## 2020-01-08 MED ORDER — LIDOCAINE HCL 1 % IJ SOLN
INTRAMUSCULAR | Status: AC
  Filled 2020-01-08: qty 20

## 2020-01-08 MED ORDER — MIDAZOLAM HCL 2 MG/2ML IJ SOLN
INTRAMUSCULAR | Status: AC | PRN
  Administered 2020-01-08: 1 mg via INTRAVENOUS

## 2020-01-08 MED ORDER — HEPARIN SODIUM (PORCINE) 1000 UNIT/ML IJ SOLN
INTRAMUSCULAR | Status: AC
  Filled 2020-01-08: qty 1

## 2020-01-08 MED ORDER — SODIUM CHLORIDE 0.9 % IV SOLN: INTRAVENOUS | Status: DC

## 2020-01-08 MED ORDER — FENTANYL CITRATE (PF) 100 MCG/2ML IJ SOLN
INTRAMUSCULAR | Status: AC
  Filled 2020-01-08: qty 2

## 2020-01-08 MED ORDER — LIDOCAINE HCL (PF) 1 % IJ SOLN
INTRAMUSCULAR | Status: AC | PRN
  Administered 2020-01-08: 2 mL

## 2020-01-08 MED ORDER — VERAPAMIL HCL 2.5 MG/ML IV SOLN: INTRA_ARTERIAL | Status: AC | PRN

## 2020-01-08 MED ORDER — IOHEXOL 300 MG/ML  SOLN
150.0000 mL | Freq: Once | INTRAMUSCULAR | Status: AC | PRN
  Administered 2020-01-08: 65 mL via INTRA_ARTERIAL

## 2020-01-08 MED ORDER — IOHEXOL 300 MG/ML  SOLN
50.0000 mL | Freq: Once | INTRAMUSCULAR | Status: AC | PRN
  Administered 2020-01-08: 10 mL via INTRA_ARTERIAL

## 2020-01-08 MED ORDER — SODIUM CHLORIDE 0.9 % IV SOLN: INTRAVENOUS | Status: AC

## 2020-01-08 MED ORDER — VERAPAMIL HCL 2.5 MG/ML IV SOLN
INTRAVENOUS | Status: AC
  Filled 2020-01-08: qty 2

## 2020-01-08 MED ORDER — FENTANYL CITRATE (PF) 100 MCG/2ML IJ SOLN
INTRAMUSCULAR | Status: AC | PRN
  Administered 2020-01-08: 25 ug via INTRAVENOUS

## 2020-01-08 MED ORDER — MIDAZOLAM HCL 2 MG/2ML IJ SOLN
INTRAMUSCULAR | Status: AC
  Filled 2020-01-08: qty 2

## 2020-01-08 NOTE — H&P (Signed)
Chief Complaint: MCA occlusion s/p stent placement on 3.15.21. Patient presents for 6 month follow up cerebral angiogram   Referring Physician(s): Deveshwar,Sanjeev  Supervising Physician: Luanne Bras  Patient Status: [facility] - Out-pt  History of Present Illness: Scott Day is a 59 y.o. male History of DM. HLD, colon cancer. Presented to the ED at [facility] in March 2021 with left sided weakness and neglect found to have  a cryptogenic stroke M1 MCA occlusion. Neuro IR performed a mechanical thrombectomy (X2) with temporary restoration of flow which showed a dissection flap in the mid/distal right M1/MCA 3 mm X 15 mm atlas intracranial stent placed in the M1 segment on 3.15.21. Presents for 6 month CVA follow up.   Patient denies any neurological deficients at this time. Patient states that he would like to be off the brilliant.    Past Medical History:  Diagnosis Date  . Cancer [facility]) 2012   colon cancer  . Closed fracture of left distal radius   . GERD (gastroesophageal reflux disease)     Past Surgical History:  Procedure Laterality Date  . BUBBLE STUDY  06/08/2019   Procedure: BUBBLE STUDY;  Surgeon: Buford Dresser, MD;  Location: [facility] ENDOSCOPY;  Service: Cardiovascular;;  . COLON SURGERY  2004   colon cancer  . IR CT HEAD LTD  06/04/2019  . IR CT HEAD LTD  06/04/2019  . IR INTRA CRAN STENT  06/04/2019  . IR PERCUTANEOUS ART THROMBECTOMY/INFUSION INTRACRANIAL INC DIAG ANGIO  06/04/2019  . IR US GUIDE VASC ACCESS RIGHT  06/04/2019  . OPEN REDUCTION INTERNAL FIXATION (ORIF) DISTAL RADIAL FRACTURE Left 09/20/2017   Procedure: OPEN REDUCTION INTERNAL FIXATION (ORIF) DISTAL RADIAL FRACTURE;  Surgeon: Leanora Cover, MD;  Location: [facility];  Service: Orthopedics;  Laterality: Left;  . RADIOLOGY WITH ANESTHESIA N/A 06/04/2019   Procedure: IR WITH ANESTHESIA;  Surgeon: Radiologist, Medication, MD;  Location: [place];  Service: Radiology;  Laterality: N/A;  . TEE  WITHOUT CARDIOVERSION N/A 06/08/2019   Procedure: TRANSESOPHAGEAL ECHOCARDIOGRAM (TEE);  Surgeon: Buford Dresser, MD;  Location: [facility] ENDOSCOPY;  Service: Cardiovascular;  Laterality: N/A;    Allergies: Patient has no known allergies.  Medications: Prior to Admission medications   Medication Sig Start Date End Date Taking? Authorizing Provider  Accu-Chek Softclix Lancets lancets Use as instructed 10/03/19  Yes Charlott Rakes, MD  acetaminophen (TYLENOL) 500 MG tablet Take 1,000 mg by mouth every 6 (six) hours as needed for moderate pain or headache.   Yes [provider]  aspirin 81 MG chewable tablet Chew 1 tablet (81 mg total) by mouth daily. 06/10/19  Yes Rinehuls, Early Chars, PA-C  Blood Glucose Monitoring Suppl (ACCU-CHEK AVIVA CONNECT) w/Device KIT Test blood sugar 3 times daily 10/02/19  Yes Newlin, Enobong, MD  FLUoxetine (PROZAC) 20 MG capsule Take 1 capsule (20 mg total) by mouth daily. 10/24/19  Yes McCue, Janett Billow, NP  glucose blood (ACCU-CHEK AVIVA PLUS) test strip Use as instructed 10/02/19  Yes Newlin, Charlane Ferretti, MD  ibuprofen (ADVIL) 200 MG tablet Take 400 mg by mouth every 6 (six) hours as needed for headache or moderate pain.   Yes [provider]  Lancets Misc. (ACCU-CHEK SOFTCLIX LANCET DEV) KIT Test blood sugar 3 times daily 10/02/19  Yes Charlott Rakes, MD  metFORMIN (GLUCOPHAGE) 500 MG tablet Take 0.5 tablets (250 mg total) by mouth daily with breakfast. 07/30/19  Yes Newlin, Enobong, MD  QUEtiapine (SEROQUEL) 25 MG tablet Take 1 tablet (25 mg total) by mouth at  bedtime. 08/22/19  Yes McCue, Janett Billow, NP  rosuvastatin (CRESTOR) 20 MG tablet Take 1 tablet (20 mg total) by mouth daily. 07/23/19  Yes McCue, Janett Billow, NP  ticagrelor (BRILINTA) 90 MG TABS tablet Take 1 tablet (90 mg total) by mouth 2 (two) times daily. 06/14/19  Yes Angiulli, Lavon Paganini, PA-C     No family history on file.  Social History   Socioeconomic History  . Marital status: Divorced    Spouse  name: Not on file  . Number of children: Not on file  . Years of education: Not on file  . Highest education level: Not on file  Occupational History  . Not on file  Tobacco Use  . Smoking status: Current Every Day Smoker    Packs/day: 1.00    Start date: 07/20/2019  . Smokeless tobacco: Never Used  . 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 Vaping Use: Former  Substance and Sexual Activity  . Alcohol use: Yes    Alcohol/week: 24.0 standard drinks    Types: 24 Cans of beer per week    Comment: 12 to 24 beers daily  . Drug use: Never  . Sexual activity: Not on file  Other Topics Concern  . Not on file  Social History Narrative  . Not on file   Social Determinants of Health   Financial Resource Strain:   . Difficulty of Paying Living Expenses: Not on file  Food Insecurity:   . Worried About Charity fundraiser in the Last Year: Not on file  . Ran Out of Food in the Last Year: Not on file  Transportation Needs:   . Lack of Transportation (Medical): Not on file  . Lack of Transportation (Non-Medical): Not on file  Physical Activity:   . Days of Exercise per Week: Not on file  . Minutes of Exercise per Session: Not on file  Stress:   . Feeling of Stress : Not on file  Social Connections:   . Frequency of Communication with Friends and Family: Not on file  . Frequency of Social Gatherings with Friends and Family: Not on file  . Attends Religious Services: Not on file  . Active Member of Clubs or Organizations: Not on file  . Attends Archivist Meetings: Not on file  . Marital Status: Not on file     Review of Systems: A 12 point ROS discussed and pertinent positives are indicated in the HPI above.  All other systems are negative.  Review of Systems  Constitutional: Negative for fever.  HENT: Negative for congestion.   Respiratory: Negative for cough and shortness of breath.   Cardiovascular: Negative for chest pain.   Gastrointestinal: Negative for abdominal pain.  Neurological: Negative for headaches.  Psychiatric/Behavioral: Negative for behavioral problems and confusion.    Vital Signs: BP (!) 151/89   Pulse 74   Temp 98 F (36.7 C) (Oral)   Resp 20   Ht 5' 9"  (1.753 m)   Wt 232 lb (105.2 kg)   SpO2 100%   BMI 34.26 kg/m   Physical Exam Vitals and nursing note reviewed.  Constitutional:      Appearance: He is well-developed.  HENT:     Head: Normocephalic.  Cardiovascular:     Rate and Rhythm: Normal rate and regular rhythm.     Heart sounds: Normal heart sounds.  Pulmonary:     Effort: Pulmonary effort is normal.     Breath sounds:  Normal breath sounds.  Musculoskeletal:        General: Normal range of motion.     Cervical back: Normal range of motion.  Skin:    General: Skin is dry.  Neurological:     Mental Status: He is alert and oriented to person, place, and time.     Comments: Alert, aware and oriented X 3 Speech and comprehension intact.  Speech, cognition and language  are generally intact.  Comprehension and fluency are normal.  Judgment and insight normal No facial droop noted Tongue midline Can spontaneously move all 4 extremities. Hand grip strength equal bilaterally.  No bilateral lower extremity drift noted Fine motor and coordination slow but  intact.        Imaging: No results found.  Labs:  CBC: Recent Labs    06/07/19 0414 06/08/19 0345 06/09/19 0432 06/11/19 0543  WBC 6.8 7.3 13.4* 10.9*  HGB 14.6 14.8 15.0 15.1  HCT 42.9 43.5 43.1 44.5  PLT 211 219 212 236    COAGS: Recent Labs    06/04/19 1125  INR 1.1  APTT 31    BMP: Recent Labs    06/08/19 0345 06/09/19 0432 06/11/19 0543 09/10/19 1045  NA 142 137 137 140  K 3.5 3.3* 3.8 4.4  CL 109 105 101 103  CO2 22 19* 25 24  GLUCOSE 128* 128* 133* 106*  BUN 8 8 10 11   CALCIUM 9.0 9.0 9.0 9.5  CREATININE 0.85 0.85 1.07 1.00  GFRNONAA >60 >60 >60 82  GFRAA >60 >60 >60 95     LIVER FUNCTION TESTS: Recent Labs    06/04/19 1125 06/11/19 0543 09/10/19 1045  BILITOT 0.9 0.7 0.5  AST 19 25 20   ALT 24 42 26  ALKPHOS 74 86 87  PROT 6.9 6.8 6.9  ALBUMIN 3.9 3.3* 4.6    Assessment and Plan:  39. y.o. male outpatient. History of DM, HLD, colon cancer. Presented to the ED at [facility] in March 2021 with left sided weakness and neglect found to have  a cryptogenic stroke M1 MCA occlusion. Neuro IR performed a mechanical thrombectomy (X2) with temporary restoration of flow which showed a dissection flap in the mid/distal right M1/MCA 3 mm X 15 mm atlas intracranial stent placed in the M1 segment on 3.15.21. Patient presents for 6 month CVA follow up.   No pertinent follow up imaging. Patient is on brilinta 90 mg BID. All other labs and medications are within acceptable parameters. NKDA allergies Patient has been NPO since midnight.  Risks and benefits of cerebral angiogram were discussed with the patient including, but not limited to bleeding, infection, vascular injury or contrast induced renal failure.  This interventional procedure involves the use of X-rays and because of the nature of the planned procedure, it is possible that we will have prolonged use of X-ray fluoroscopy.  Potential radiation risks to you include (but are not limited to) the following: - A slightly elevated risk for cancer  several years later in life. This risk is typically less than 0.5% percent. This risk is low in comparison to the normal incidence of human cancer, which is 33% for women and 50% for men according to the [place]. - Radiation induced injury can include skin redness, resembling a rash, tissue breakdown / ulcers and hair loss (which can be temporary or permanent).   The likelihood of either of these occurring depends on the difficulty of the procedure and whether you are sensitive to radiation due  to previous procedures, disease, or genetic conditions.   IF  your procedure requires a prolonged use of radiation, you will be notified and given written instructions for further action.  It is your responsibility to monitor the irradiated area for the 2 weeks following the procedure and to notify your physician if you are concerned that you have suffered a radiation induced injury.    All of the patient's questions were answered, patient is agreeable to proceed.  Consent signed and in chart.    Thank you for this interesting consult.  I greatly enjoyed meeting Kass Herberger Yanda and look forward to participating in their care.  A copy of this report was sent to the requesting provider on this date.  Electronically Signed: Jacqualine Mau, NP 01/08/2020, 7:41 AM   I spent a total of  30 Minutes in face to face in clinical consultation, greater than 50% of which was counseling/coordinating care for cerebral angiogram

## 2020-01-08 NOTE — Discharge Instructions (Signed)
Drink plenty of fluids for 48 hours and keep wrist elevated at heart level for 24 hours  Radial Site Care   This sheet gives you information about how to care for yourself after your procedure. Your health care provider may also give you more specific instructions. If you have problems or questions, contact your health care provider. What can I expect after the procedure? After the procedure, it is common to have:  Bruising and tenderness at the catheter insertion area. Follow these instructions at home: Medicines  Take over-the-counter and prescription medicines only as told by your health care provider. Insertion site care 1. Follow instructions from your health care provider about how to take care of your insertion site. Make sure you: ? Wash your hands with soap and water before you change your bandage (dressing). If soap and water are not available, use hand sanitizer. ? Remove your dressing as told by your health care provider. In 24 hours 2. Check your insertion site every day for signs of infection. Check for: ? Redness, swelling, or pain. ? Fluid or blood. ? Pus or a bad smell. ? Warmth. 3. Do not take baths, swim, or use a hot tub until your health care provider approves. 4. You may shower 24-48 hours after the procedure, or as directed by your health care provider. ? Remove the dressing and gently wash the site with plain soap and water. ? Pat the area dry with a clean towel. ? Do not rub the site. That could cause bleeding. 5. Do not apply powder or lotion to the site. 6. Drink enough fluid to keep your pee (urine) clear or pale yellow.  Activity   1. For 24 hours after the procedure, or as directed by your health care provider: ? Do not flex or bend the affected arm. ? Do not push or pull heavy objects with the affected arm. ? Do not drive yourself home from the hospital or clinic. You may drive 24 hours after the procedure unless your health care provider tells you  not to. ? Do not operate machinery or power tools. 2. Do not lift anything that is heavier than 10 lb (4.5 kg), or the limit that you are told, until your health care provider says that it is safe.  For 4 days 3. Ask your health care provider when it is okay to: ? Return to work or school. ? Resume usual physical activities or sports. ? Resume sexual activity. General instructions  If the catheter site starts to bleed, raise your arm and put firm pressure on the site. If the bleeding does not stop, get help right away. This is a medical emergency.  If you went home on the same day as your procedure, a responsible adult should be with you for the first 24 hours after you arrive home.  Keep all follow-up visits as told by your health care provider. This is important. Contact a health care provider if:  You have a fever.  You have redness, swelling, or yellow drainage around your insertion site. Get help right away if:  You have unusual pain at the radial site.  The catheter insertion area swells very fast.  The insertion area is bleeding, and the bleeding does not stop when you hold steady pressure on the area.  Your arm or hand becomes pale, cool, tingly, or numb. These symptoms may represent a serious problem that is an emergency. Do not wait to see if the symptoms will go away. Get  medical help right away. Call your local emergency services (911 in the [place] Do not drive yourself to the hospital. Summary  After the procedure, it is common to have bruising and tenderness at the site.  Follow instructions from your health care provider about how to take care of your radial site wound. Check the wound every day for signs of infection.  Do not lift anything that is heavier than 10 lb (4.5 kg), or the limit that you are told, until your health care provider says that it is safe. This information is not intended to replace advice given to you by your health care provider. Make sure  you discuss any questions you have with your health care provider. Document Revised: 04/13/2017 Document Reviewed: 04/13/2017 Elsevier Patient Education  [place].  Moderate Conscious Sedation, Adult, Care After These instructions provide you with information about caring for yourself after your procedure. Your health care provider may also give you more specific instructions. Your treatment has been planned according to current medical practices, but problems sometimes occur. Call your health care provider if you have any problems or questions after your procedure. What can I expect after the procedure? After your procedure, it is common:  To feel sleepy for several hours.  To feel clumsy and have poor balance for several hours.  To have poor judgment for several hours.  To vomit if you eat too soon. Follow these instructions at home: For at least 24 hours after the procedure:   Do not: ? Participate in activities where you could fall or become injured. ? Drive. ? Use heavy machinery. ? Drink alcohol. ? Take sleeping pills or medicines that cause drowsiness. ? Make important decisions or sign legal documents. ? Take care of children on your own.  Rest. Eating and drinking  Follow the diet recommended by your health care provider.  If you vomit: ? Drink water, juice, or soup when you can drink without vomiting. ? Make sure you have little or no nausea before eating solid foods. General instructions  Have a responsible adult stay with you until you are awake and alert.  Take over-the-counter and prescription medicines only as told by your health care provider.  If you smoke, do not smoke without supervision.  Keep all follow-up visits as told by your health care provider. This is important. Contact a health care provider if:  You keep feeling nauseous or you keep vomiting.  You feel light-headed.  You develop a rash.  You have a fever. Get help right  away if:  You have trouble breathing. This information is not intended to replace advice given to you by your health care provider. Make sure you discuss any questions you have with your health care provider. Document Revised: 02/18/2017 Document Reviewed: 06/28/2015 Elsevier Patient Education  2020 [employer].

## 2020-01-08 NOTE — Procedures (Signed)
S/P 4 vessel cerebral arteriograms. Rt Radial approach. Findings. 1.Approx 50 to 60 % RT MCA  intrastent stenosis. S.Banessa Mao MD

## 2020-01-17 ENCOUNTER — Encounter: Payer: Self-pay | Admitting: Adult Health

## 2020-01-17 NOTE — Telephone Encounter (Signed)
Not really sure what type of information she is requesting as well as what the court appearance for. This information would probably be better obtained by neuropsych.

## 2020-01-18 ENCOUNTER — Telehealth: Payer: Self-pay

## 2020-01-18 ENCOUNTER — Ambulatory Visit: Payer: Medicaid Other | Attending: Family Medicine | Admitting: Family Medicine

## 2020-01-18 ENCOUNTER — Other Ambulatory Visit: Payer: Self-pay

## 2020-01-18 ENCOUNTER — Encounter: Payer: Self-pay | Admitting: Family Medicine

## 2020-01-18 VITALS — BP 145/85 | HR 84 | Temp 97.5°F | Wt 236.6 lb

## 2020-01-18 DIAGNOSIS — R351 Nocturia: Secondary | ICD-10-CM | POA: Diagnosis not present

## 2020-01-18 DIAGNOSIS — R5383 Other fatigue: Secondary | ICD-10-CM | POA: Diagnosis not present

## 2020-01-18 DIAGNOSIS — I10 Essential (primary) hypertension: Secondary | ICD-10-CM | POA: Diagnosis not present

## 2020-01-18 DIAGNOSIS — E119 Type 2 diabetes mellitus without complications: Secondary | ICD-10-CM

## 2020-01-18 DIAGNOSIS — I69319 Unspecified symptoms and signs involving cognitive functions following cerebral infarction: Secondary | ICD-10-CM | POA: Diagnosis not present

## 2020-01-18 DIAGNOSIS — Z85038 Personal history of other malignant neoplasm of large intestine: Secondary | ICD-10-CM | POA: Diagnosis not present

## 2020-01-18 DIAGNOSIS — R0683 Snoring: Secondary | ICD-10-CM

## 2020-01-18 DIAGNOSIS — I63311 Cerebral infarction due to thrombosis of right middle cerebral artery: Secondary | ICD-10-CM | POA: Diagnosis not present

## 2020-01-18 DIAGNOSIS — R221 Localized swelling, mass and lump, neck: Secondary | ICD-10-CM

## 2020-01-18 NOTE — Telephone Encounter (Signed)
Please call Amy Alroy Dust (ph# [phone]) or respond to her Email. Per  Amy, Mr. Gitlin needs a reply today. Because he has a court date on Monday.   Thank you Micalah Cabezas
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
## 2020-01-19 LAB — COMPREHENSIVE METABOLIC PANEL WITH GFR
ALT: 34 IU/L (ref 0–44)
AST: 24 IU/L (ref 0–40)
Albumin/Globulin Ratio: 1.7 (ref 1.2–2.2)
Albumin: 4.5 g/dL (ref 3.8–4.9)
Alkaline Phosphatase: 86 IU/L (ref 44–121)
BUN/Creatinine Ratio: 13 (ref 9–20)
BUN: 11 mg/dL (ref 6–24)
Bilirubin Total: 0.2 mg/dL (ref 0.0–1.2)
CO2: 22 mmol/L (ref 20–29)
Calcium: 9.5 mg/dL (ref 8.7–10.2)
Chloride: 103 mmol/L (ref 96–106)
Creatinine, Ser: 0.85 mg/dL (ref 0.76–1.27)
GFR calc Af Amer: 110 mL/min/1.73
GFR calc non Af Amer: 95 mL/min/1.73
Globulin, Total: 2.6 g/dL (ref 1.5–4.5)
Glucose: 125 mg/dL — ABNORMAL HIGH (ref 65–99)
Potassium: 3.9 mmol/L (ref 3.5–5.2)
Sodium: 139 mmol/L (ref 134–144)
Total Protein: 7.1 g/dL (ref 6.0–8.5)

## 2020-01-19 LAB — PSA: Prostate Specific Ag, Serum: 0.4 ng/mL (ref 0.0–4.0)

## 2020-01-19 LAB — TSH: TSH: 1.22 u[IU]/mL (ref 0.450–4.500)

## 2020-01-19 LAB — HEMOGLOBIN A1C
Est. average glucose Bld gHb Est-mCnc: 117 mg/dL
Hgb A1c MFr Bld: 5.7 % — ABNORMAL HIGH (ref 4.8–5.6)

## 2020-01-22 ENCOUNTER — Other Ambulatory Visit: Payer: Self-pay

## 2020-01-22 ENCOUNTER — Encounter: Payer: Medicaid Other | Attending: Physical Medicine & Rehabilitation | Admitting: Psychology

## 2020-01-22 DIAGNOSIS — F17200 Nicotine dependence, unspecified, uncomplicated: Secondary | ICD-10-CM | POA: Diagnosis not present

## 2020-01-22 DIAGNOSIS — F0391 Unspecified dementia with behavioral disturbance: Secondary | ICD-10-CM | POA: Diagnosis not present

## 2020-01-22 DIAGNOSIS — F1010 Alcohol abuse, uncomplicated: Secondary | ICD-10-CM | POA: Insufficient documentation

## 2020-01-22 NOTE — Telephone Encounter (Signed)
I called and LMVM for Amy, girlfriend of pt.  M/NP says that per stroke he does have residual cognitive impairment (but she is not able to specifically state that he is incapable of making his own decisions.  This would best be obtained by neuropsych.  If still needs letter to let us know.  She may respond by [employer] email if easier.

## 2020-01-22 NOTE — Telephone Encounter (Signed)
A letter can be provided stating that due to his stroke, he has residual cognitive impairment but unable to specifically state that he is incapable of making his own decisions.  This information will need to be obtained by neuropsychology.  Please let me know if the latter is still needed.  Thank you.

## 2020-01-27 ENCOUNTER — Encounter: Payer: Self-pay | Admitting: Psychology

## 2020-01-27 NOTE — Progress Notes (Signed)
THERAPIST PROGRESS NOTE  Session Time: 60 min Type of Therapy: Individual Therapy  Subjective:    Patient ID: Scott Day is a 59 y.o. male.  Chief Complaint: Anger and aggression, behavioral disturbance, polysubstance abuse, recent unemployment   Objective:  Physical Exam Psychiatric:        Attention and Perception: Perception normal. He is attentive. He does not perceive auditory or visual hallucinations.        Mood and Affect: Mood is anxious. Mood is not depressed or elated. Affect is blunt and angry. Affect is not labile, flat or tearful.        Speech: He is communicative. Speech is slurred and tangential. Speech is not rapid and pressured or delayed.        Behavior: Behavior is agitated, aggressive and combative. Behavior is not slowed, withdrawn or hyperactive.        Thought Content: Thought content is paranoid (mild ). Thought content is not delusional. Thought content does not include homicidal or suicidal ideation. Thought content does not include homicidal or suicidal plan.        Judgment: Judgment is impulsive and inappropriate.      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     Alfonso Ellis 01/27/2020

## 2020-01-29 ENCOUNTER — Encounter: Payer: Medicaid Other | Admitting: Psychology

## 2020-01-29 ENCOUNTER — Encounter: Payer: Self-pay | Admitting: Psychology

## 2020-01-29 ENCOUNTER — Ambulatory Visit (INDEPENDENT_AMBULATORY_CARE_PROVIDER_SITE_OTHER): Payer: Medicaid Other

## 2020-01-29 ENCOUNTER — Other Ambulatory Visit: Payer: Self-pay

## 2020-01-29 ENCOUNTER — Encounter: Payer: Self-pay | Admitting: Internal Medicine

## 2020-01-29 DIAGNOSIS — I639 Cerebral infarction, unspecified: Secondary | ICD-10-CM

## 2020-01-29 DIAGNOSIS — F0391 Unspecified dementia with behavioral disturbance: Secondary | ICD-10-CM

## 2020-01-29 DIAGNOSIS — F17200 Nicotine dependence, unspecified, uncomplicated: Secondary | ICD-10-CM | POA: Diagnosis not present

## 2020-01-29 LAB — CUP PACEART REMOTE DEVICE CHECK
Date Time Interrogation Session: 20211108215257
Implantable Pulse Generator Implant Date: 20210924

## 2020-01-29 NOTE — Progress Notes (Signed)
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 He is described as labile with a tendency to become very angry and threaten physical violence at times. He is described as showing little remorse or awareness during these anger outbursts and is extremely hard to redirected or calm down. The patient has difficulty when plans are changed quickly; He gets easily overwhelmed.  He expresses realistic concerns about his ability to work in the future and live independently. 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 When asked about possible return to full time work, he continued to state that current physical and cognitive limitations would negatively impact occupational performance at most manual and construction type jobs; this causes anger and exacerbates low mood. He admitted to currently driving short distances in his car and thinks he is fine to do so safely.   His longtime friend and partner and current roomate continues to transport him to medical/therapy appointments but reports feeling overwhelmed by his aggressive outbursts and challenging behavior. She continues to observe significant inattention and short-term memory issues, dysarthric speech and changes to expressive language,  information processing speed difficulties increased agitation and frustration, anger outbursts, lack of motivation, confusion and disorientation, lack of safety awareness, slowed  processing speed, motor weakness, and executive dysfunction (e.g., impulsivity, emotion dysregulation, disinhibition, inflexibility, etc.). She stated that basic self care has mildly improved and that he helps out more around the house.   Symptoms are currently rated Moderate-Severe. Associated signs and symptoms include: anger, attention difficulties, excessively aggressive and stubborn. There are also the following concerns:  Legal issue Occupational concerns Substance abuse Other: relational conflict.    Past Medical History:  Past Medical History:  Diagnosis Date  . Cancer [facility]) 2012   colon cancer  . Closed fracture of left distal radius   . GERD (gastroesophageal reflux disease)     Past Surgical History:  Procedure Laterality Date  . BUBBLE STUDY  06/08/2019   Procedure: BUBBLE STUDY;  Surgeon: Buford Dresser, MD;  Location: [facility] ENDOSCOPY;  Service: Cardiovascular;;  . COLON SURGERY  2004   colon cancer  . IR ANGIO INTRA EXTRACRAN SEL COM CAROTID INNOMINATE BILAT MOD SED  01/08/2020  . IR ANGIO VERTEBRAL SEL SUBCLAVIAN INNOMINATE BILAT MOD SED  01/08/2020  . IR CT HEAD LTD  06/04/2019  . IR CT HEAD LTD  06/04/2019  . IR INTRA CRAN STENT  06/04/2019  . IR PERCUTANEOUS ART THROMBECTOMY/INFUSION INTRACRANIAL INC DIAG ANGIO  06/04/2019  . IR US GUIDE VASC ACCESS RIGHT  06/04/2019  . OPEN REDUCTION INTERNAL FIXATION (ORIF) DISTAL RADIAL FRACTURE Left 09/20/2017   Procedure: OPEN REDUCTION INTERNAL FIXATION (ORIF) DISTAL RADIAL FRACTURE;  Surgeon: Leanora Cover, MD;  Location: [place];  Service: Orthopedics;  Laterality: Left;  . RADIOLOGY WITH ANESTHESIA N/A 06/04/2019   Procedure: IR WITH ANESTHESIA;  Surgeon: Radiologist, Medication, MD;  Location: [place];  Service: Radiology;  Laterality: N/A;  . TEE WITHOUT CARDIOVERSION N/A 06/08/2019   Procedure: TRANSESOPHAGEAL ECHOCARDIOGRAM (TEE);  Surgeon:  Buford Dresser, MD;  Location: [facility] ENDOSCOPY;  Service:  Cardiovascular;  Laterality: N/A;   Sleep: Good Appetite:  Good  Current Medications: Current Outpatient Medications  Medication Sig Dispense Refill  . Accu-Chek Softclix Lancets lancets Use as instructed 100 each 12  . acetaminophen (TYLENOL) 500 MG tablet Take 1,000 mg by mouth every 6 (six) hours as needed for moderate pain or headache.    Marland Kitchen aspirin 81 MG chewable tablet Chew 1 tablet (81 mg total) by mouth daily.    . Blood Glucose Monitoring Suppl (ACCU-CHEK AVIVA CONNECT) w/Device KIT Test blood sugar 3 times daily 1 kit 0  . FLUoxetine (PROZAC) 20 MG capsule Take 1 capsule (20 mg total) by mouth daily. 90 capsule 3  . glucose blood (ACCU-CHEK AVIVA PLUS) test strip Use as instructed 100 each 12  . ibuprofen (ADVIL) 200 MG tablet Take 400 mg by mouth every 6 (six) hours as needed for headache or moderate pain.    . Lancets Misc. (ACCU-CHEK SOFTCLIX LANCET DEV) KIT Test blood sugar 3 times daily 1 kit 0  . metFORMIN (GLUCOPHAGE) 500 MG tablet Take 0.5 tablets (250 mg total) by mouth daily with breakfast. 30 tablet 3  . QUEtiapine (SEROQUEL) 25 MG tablet Take 1 tablet (25 mg total) by mouth at bedtime. 90 tablet 1  . rosuvastatin (CRESTOR) 20 MG tablet Take 1 tablet (20 mg total) by mouth daily. 30 tablet 12  . ticagrelor (BRILINTA) 90 MG TABS tablet Take 1 tablet (90 mg total) by mouth 2 (two) times daily. 60 tablet 1   No current facility-administered medications for this visit.   Medical History: Past Medical History:  Diagnosis Date  . Cancer [facility]) 2012   colon cancer  . Closed fracture of left distal radius   . GERD (gastroesophageal reflux disease)    Current Medications:  Outpatient Encounter Medications as of 10/22/2019  Medication Sig  . Accu-Chek Softclix Lancets lancets Use as instructed  . acetaminophen (TYLENOL) 325 MG tablet Take 2 tablets (650 mg total) by mouth every 4 (four) hours as needed for mild pain (or temp > 37.5 C (99.5 F)).  Marland Kitchen aspirin 81 MG chewable  tablet Chew 1 tablet (81 mg total) by mouth daily.  . Blood Glucose Monitoring Suppl (ACCU-CHEK AVIVA CONNECT) w/Device KIT Test blood sugar 3 times daily  . glucose blood (ACCU-CHEK AVIVA PLUS) test strip Use as instructed  . Lancets Misc. (ACCU-CHEK SOFTCLIX LANCET DEV) KIT Test blood sugar 3 times daily  . metFORMIN (GLUCOPHAGE) 500 MG tablet Take 0.5 tablets (250 mg total) by mouth daily with breakfast.  . QUEtiapine (SEROQUEL) 25 MG tablet Take 1 tablet (25 mg total) by mouth at bedtime.  . rosuvastatin (CRESTOR) 20 MG tablet Take 1 tablet (20 mg total) by mouth daily.  . sertraline (ZOLOFT) 50 MG tablet Take 1 tablet (50 mg total) by mouth at bedtime.  . ticagrelor (BRILINTA) 90 MG TABS tablet Take 1 tablet (90 mg total) by mouth 2 (two) times daily.   No facility-administered encounter medications on file as of 10/22/2019.   Objective:   General Appearance: Fairly Groomed and Guarded  Eye Contact:  Minimal  Speech:  mildly dysarthric with reduced fluency and WFD  Volume:  variable, alternates between soft, like a whisper, and loud when having outburst  Mood:  Angry, Anxious, Irritable and Worthless  Affect:  Blunt, Congruent and Constricted  Thought Process:  Coherent, Goal Directed, Irrelevant, Linear and Descriptions of Associations: Tangential  Orientation:  Full (Time,  Place, and Person)  Thought Content:  Obsessions, Rumination and Tangential  Suicidal Thoughts:  No  Homicidal Thoughts:  No  Memory:  Recent;   Fair Remote; Fair  Judgement:  Poor  Insight:  Lacking  Psychomotor Activity:  Restlessness  Concentration: limited-Fair  Recall:  Poor  Fund of Knowledge:  Poor  Language:  Poor  Akathisia:  No  Handed:  Right  AIMS (if indicated):     Assets:  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   Cognition:   Impaired,  Mild  Sleep:    Intact   Assessment:   Therapeutic Interventions: Behavior modification, Motivational Interviewing   Participation: Active  Effectiveness:  Established problem, Unchanged (2), Review of last therapy session (1) and Review of psycho-social stressors (1). Not Met It is felt more time is needed for Interventions to work.  Plan:   We scheduled patient for another 8 weekly individual therapy sessions as he has not reached target goal of reducing verbal outbursts and appropriately expressing feelings.   His partner will be included more in session to practice effective communication and conflict resolution strategies and skills.   Therapy Goal(s):  1. Increase ability to appropriately verbalize feelings 2. Increase emotional regulation 3. Facilitate patient progression through stages of change regarding substance use diagnoses and concerns 4. Identify triggers associated with mental health/substance abuse issues 5. Increase skills for wellness and recovery 6. Reduce tobacco use - did not meet goal to reduce by 50% by 01/25/20;   - Updated: Reduce by 25% by 03/27/19  Homework: Record aggressive behavior (e.g., verbal and physical outbursts) using log provided in therapy.    Alfonso Ellis 01/29/2020, 6:10 PM

## 2020-01-31 ENCOUNTER — Encounter: Payer: Self-pay | Admitting: General Surgery

## 2020-01-31 ENCOUNTER — Other Ambulatory Visit: Payer: Self-pay

## 2020-01-31 ENCOUNTER — Ambulatory Visit (INDEPENDENT_AMBULATORY_CARE_PROVIDER_SITE_OTHER): Payer: Medicaid Other | Admitting: General Surgery

## 2020-01-31 VITALS — BP 162/82 | HR 68 | Temp 97.8°F | Resp 18 | Ht 69.0 in | Wt 236.0 lb

## 2020-01-31 DIAGNOSIS — L723 Sebaceous cyst: Secondary | ICD-10-CM

## 2020-01-31 NOTE — Progress Notes (Signed)
Carelink Summary Report / Loop Recorder 

## 2020-01-31 NOTE — Patient Instructions (Signed)
Epidermal Cyst  An epidermal cyst is a small, painless lump under your skin. The cyst contains a grayish-white, bad-smelling substance (keratin). Do not try to pop or open an epidermal cyst yourself. What are the causes?  A blocked hair follicle.  A hair that curls and re-enters the skin instead of growing straight out of the skin.  A blocked pore.  Irritated skin.  An injury to the skin.  Certain conditions that are passed along from parent to child (inherited).  Human papillomavirus (HPV).  Long-term sun damage to the skin. What increases the risk?  Having acne.  Being overweight.  Being 80-3 years old. What are the signs or symptoms? These cysts are usually harmless, but they can get infected. Symptoms of infection may include:  Redness.  Inflammation.  Tenderness.  Warmth.  Fever.  A grayish-white, bad-smelling substance drains from the cyst.  Pus drains from the cyst. How is this treated? In many cases, epidermal cysts go away on their own without treatment. If a cyst becomes infected, treatment may include:  Opening and draining the cyst, done by a doctor. After draining, you may need minor surgery to remove the rest of the cyst.  Antibiotic medicine.  Shots of medicines (steroids) that help to reduce inflammation.  Surgery to remove the cyst. Surgery may be done if the cyst: ? Becomes large. ? Bothers you. ? Has a chance of turning into cancer.  Do not try to open a cyst yourself. Follow these instructions at home:  Take over-the-counter and prescription medicines only as told by your doctor.  If you were prescribed an antibiotic medicine, take it it as told by your doctor. Do not stop using the antibiotic even if you start to feel better.  Keep the area around your cyst clean and dry.  Wear loose, dry clothing.  Avoid touching your cyst.  Check your cyst every day for signs of infection. Check for: ? Redness, swelling, or pain. ? Fluid  or blood. ? Warmth. ? Pus or a bad smell.  Keep all follow-up visits as told by your doctor. This is important. How is this prevented?  Wear clean, dry, clothing.  Avoid wearing tight clothing.  Keep your skin clean and dry. Take showers or baths every day. Contact a doctor if:  Your cyst has symptoms of infection.  Your condition does not improve or gets worse.  You have a cyst that looks different from other cysts you have had.  You have a fever. Get help right away if:  Redness spreads from the cyst into the area close by. Summary  An epidermal cyst is a sac made of skin tissue.  If a cyst becomes infected, treatment may include surgery to open and drain the cyst, or to remove it.  Take over-the-counter and prescription medicines only as told by your doctor.  Contact a doctor if your condition is not improving or is getting worse.  Keep all follow-up visits as told by your doctor. This is important. This information is not intended to replace advice given to you by your health care provider. Make sure you discuss any questions you have with your health care provider. Document Revised: 06/29/2018 Document Reviewed: 12/15/2017 Elsevier Patient Education  2020 [employer].

## 2020-01-31 NOTE — Progress Notes (Signed)
Scott Day; [id number]; [birth year]   HPI Patient is a 59 year old white male who was referred to my care by Dr. Edger House evaluation and treatment of a knot on the back of his neck.  The knot has been present for some time now.  Occasionally swells.  No recent drainage noted.  No tenderness noted.  No pain noted.  Does complain of left shoulder discomfort and was wondering if the knot was causing it.  Patient is on Brilinta for a recent history of intracranial stent placement. Past Medical History:  Diagnosis Date  . Cancer [facility]) 2012   colon cancer  . Closed fracture of left distal radius   . GERD (gastroesophageal reflux disease)     Past Surgical History:  Procedure Laterality Date  . BUBBLE STUDY  06/08/2019   Procedure: BUBBLE STUDY;  Surgeon: Buford Dresser, MD;  Location: [facility] ENDOSCOPY;  Service: Cardiovascular;;  . COLON SURGERY  2004   colon cancer  . IR ANGIO INTRA EXTRACRAN SEL COM CAROTID INNOMINATE BILAT MOD SED  01/08/2020  . IR ANGIO VERTEBRAL SEL SUBCLAVIAN INNOMINATE BILAT MOD SED  01/08/2020  . IR CT HEAD LTD  06/04/2019  . IR CT HEAD LTD  06/04/2019  . IR INTRA CRAN STENT  06/04/2019  . IR PERCUTANEOUS ART THROMBECTOMY/INFUSION INTRACRANIAL INC DIAG ANGIO  06/04/2019  . IR US GUIDE VASC ACCESS RIGHT  06/04/2019  . OPEN REDUCTION INTERNAL FIXATION (ORIF) DISTAL RADIAL FRACTURE Left 09/20/2017   Procedure: OPEN REDUCTION INTERNAL FIXATION (ORIF) DISTAL RADIAL FRACTURE;  Surgeon: Leanora Cover, MD;  Location: [place];  Service: Orthopedics;  Laterality: Left;  . RADIOLOGY WITH ANESTHESIA N/A 06/04/2019   Procedure: IR WITH ANESTHESIA;  Surgeon: Radiologist, Medication, MD;  Location: [place];  Service: Radiology;  Laterality: N/A;  . TEE WITHOUT CARDIOVERSION N/A 06/08/2019   Procedure: TRANSESOPHAGEAL ECHOCARDIOGRAM (TEE);  Surgeon: Buford Dresser, MD;  Location: [facility] ENDOSCOPY;  Service: Cardiovascular;  Laterality: N/A;    History reviewed. No  pertinent family history.  Current Outpatient Medications on File Prior to Visit  Medication Sig Dispense Refill  . Accu-Chek Softclix Lancets lancets Use as instructed 100 each 12  . acetaminophen (TYLENOL) 500 MG tablet Take 1,000 mg by mouth every 6 (six) hours as needed for moderate pain or headache.    Marland Kitchen aspirin 81 MG chewable tablet Chew 1 tablet (81 mg total) by mouth daily.    . Blood Glucose Monitoring Suppl (ACCU-CHEK AVIVA CONNECT) w/Device KIT Test blood sugar 3 times daily 1 kit 0  . FLUoxetine (PROZAC) 20 MG capsule Take 1 capsule (20 mg total) by mouth daily. 90 capsule 3  . glucose blood (ACCU-CHEK AVIVA PLUS) test strip Use as instructed 100 each 12  . ibuprofen (ADVIL) 200 MG tablet Take 400 mg by mouth every 6 (six) hours as needed for headache or moderate pain.    . Lancets Misc. (ACCU-CHEK SOFTCLIX LANCET DEV) KIT Test blood sugar 3 times daily 1 kit 0  . metFORMIN (GLUCOPHAGE) 500 MG tablet Take 0.5 tablets (250 mg total) by mouth daily with breakfast. 30 tablet 3  . QUEtiapine (SEROQUEL) 25 MG tablet Take 1 tablet (25 mg total) by mouth at bedtime. 90 tablet 1  . rosuvastatin (CRESTOR) 20 MG tablet Take 1 tablet (20 mg total) by mouth daily. 30 tablet 12  . ticagrelor (BRILINTA) 90 MG TABS tablet Take 1 tablet (90 mg total) by mouth 2 (two) times daily. 60 tablet 1   No current facility-administered medications  on file prior to visit.    No Known Allergies  Social History   Substance and Sexual Activity  Alcohol Use Yes  . Alcohol/week: 24.0 standard drinks  . Types: 24 Cans of beer per week   Comment: 12 to 24 beers daily    Social History   Tobacco Use  Smoking Status Current Every Day Smoker  . Packs/day: 1.00  . Start date: 07/20/2019  Smokeless Tobacco Never Used  Tobacco Comment   started back in April "patches did not work"    Review of Systems  Constitutional: Positive for malaise/fatigue.  HENT: Positive for sinus pain.   Eyes: Negative.    Respiratory: Positive for cough and shortness of breath.   Cardiovascular: Negative.   Gastrointestinal: Positive for abdominal pain, heartburn and nausea.  Genitourinary: Positive for frequency.  Musculoskeletal: Positive for back pain and neck pain.  Skin: Negative.   Neurological: Positive for tingling and sensory change.  Endo/Heme/Allergies: Bruises/bleeds easily.  Psychiatric/Behavioral: Negative.     Objective   Vitals:   01/31/20 0907  BP: (!) 162/82  Pulse: 68  Resp: 18  Temp: 97.8 F (36.6 C)  SpO2: 98%    Physical Exam Vitals reviewed.  Constitutional:      Appearance: Normal appearance. He is not ill-appearing.  HENT:     Head: Normocephalic and atraumatic.  Cardiovascular:     Rate and Rhythm: Normal rate and regular rhythm.     Heart sounds: Normal heart sounds. No murmur heard.  No friction rub. No gallop.   Pulmonary:     Effort: Pulmonary effort is normal. No respiratory distress.     Breath sounds: Normal breath sounds. No stridor. No wheezing, rhonchi or rales.  Skin:    General: Skin is warm and dry.     Comments: 1.5cm subcutaneous mobile nodule with punctum along right lower neck.  No induration or erythema noted.  Neurological:     Mental Status: He is alert and oriented to person, place, and time.     Assessment  Sebaceous cyst, neck Plan   No need for surgical excision at this time.  I told the patient that his left shoulder sensory changes are unrelated to this cyst.  Given his recent neurologic issues, I would not proceed with excision at this time.  Patient understands and agrees.  Follow-up as needed.

## 2020-02-04 ENCOUNTER — Ambulatory Visit: Payer: Medicaid Other | Admitting: Pharmacist

## 2020-02-05 ENCOUNTER — Encounter: Payer: Medicaid Other | Admitting: Psychology

## 2020-02-05 ENCOUNTER — Other Ambulatory Visit: Payer: Self-pay

## 2020-02-05 DIAGNOSIS — F0391 Unspecified dementia with behavioral disturbance: Secondary | ICD-10-CM

## 2020-02-05 DIAGNOSIS — F17200 Nicotine dependence, unspecified, uncomplicated: Secondary | ICD-10-CM

## 2020-02-05 DIAGNOSIS — F1010 Alcohol abuse, uncomplicated: Secondary | ICD-10-CM

## 2020-02-05 NOTE — Progress Notes (Addendum)
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 He is described as labile with a tendency to become very angry and threaten physical violence at times. He is described as showing little remorse or awareness during these anger outbursts and is extremely hard to redirected or calm down. The patient has difficulty when plans are changed quickly; He gets easily overwhelmed.  He expresses realistic concerns about his ability to work in the future and live independently. 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 He has recently been spending weekends hunting with friends on his property.    He continues to drive short distances in familiar areas in car and denies difficulty following directions, making wrong turns, getting confused/lost, or trouble operating.   He admits to drinking beer (between 12-24 cans of Bud-Light) and smoking cigarettes (1-2 packs) daily.   Symptoms are currently rated Moderate-Severe. Associated signs and symptoms include: anger, attention difficulties, excessively aggressive and stubborn. There are also the following concerns:  Legal issue Occupational concerns Polysubstance abuse Other: relational conflict.    Past Medical History:  Past Medical History:  Diagnosis Date  . Cancer [facility]) 2012   colon cancer  . Closed fracture of left distal radius   . GERD (gastroesophageal reflux disease)      Past Surgical History:  Procedure Laterality Date  . BUBBLE STUDY  06/08/2019   Procedure: BUBBLE STUDY;  Surgeon: Buford Dresser, MD;  Location: [facility] ENDOSCOPY;  Service: Cardiovascular;;  . COLON SURGERY  2004   colon cancer  . IR ANGIO INTRA EXTRACRAN SEL COM CAROTID INNOMINATE BILAT MOD SED  01/08/2020  . IR ANGIO VERTEBRAL SEL SUBCLAVIAN INNOMINATE BILAT MOD SED  01/08/2020  . IR CT HEAD LTD  06/04/2019  . IR CT HEAD LTD  06/04/2019  . IR INTRA CRAN STENT  06/04/2019  . IR PERCUTANEOUS ART THROMBECTOMY/INFUSION INTRACRANIAL INC DIAG ANGIO  06/04/2019  . IR US GUIDE VASC ACCESS RIGHT  06/04/2019  . OPEN REDUCTION INTERNAL FIXATION (ORIF) DISTAL RADIAL FRACTURE Left 09/20/2017   Procedure: OPEN REDUCTION INTERNAL FIXATION (ORIF) DISTAL RADIAL FRACTURE;  Surgeon: Leanora Cover, MD;  Location: [place];  Service: Orthopedics;  Laterality: Left;  . RADIOLOGY WITH ANESTHESIA N/A 06/04/2019   Procedure: IR WITH ANESTHESIA;  Surgeon: Radiologist, Medication, MD;  Location: [place];  Service: Radiology;  Laterality: N/A;  . TEE WITHOUT CARDIOVERSION N/A 06/08/2019   Procedure: TRANSESOPHAGEAL ECHOCARDIOGRAM (TEE);  Surgeon: Buford Dresser, MD;  Location: [facility] ENDOSCOPY;  Service: Cardiovascular;  Laterality: N/A;   Sleep: Good Appetite:  Good  Current Medications: Current Outpatient Medications  Medication Sig Dispense Refill  . Accu-Chek Softclix Lancets lancets Use as instructed 100 each 12  . acetaminophen (TYLENOL) 500 MG tablet Take 1,000 mg by mouth every 6 (six) hours as needed for moderate pain or headache.    Marland Kitchen aspirin 81 MG chewable tablet Chew 1 tablet (81 mg total) by mouth daily.    . Blood Glucose Monitoring Suppl (ACCU-CHEK AVIVA CONNECT)  w/Device KIT Test blood sugar 3 times daily 1 kit 0  . FLUoxetine (PROZAC) 20 MG capsule Take 1 capsule (20 mg total) by mouth daily. 90 capsule 3  . glucose blood (ACCU-CHEK AVIVA PLUS) test strip Use as instructed 100  each 12  . ibuprofen (ADVIL) 200 MG tablet Take 400 mg by mouth every 6 (six) hours as needed for headache or moderate pain.    . Lancets Misc. (ACCU-CHEK SOFTCLIX LANCET DEV) KIT Test blood sugar 3 times daily 1 kit 0  . metFORMIN (GLUCOPHAGE) 500 MG tablet Take 0.5 tablets (250 mg total) by mouth daily with breakfast. 30 tablet 3  . QUEtiapine (SEROQUEL) 25 MG tablet Take 1 tablet (25 mg total) by mouth at bedtime. 90 tablet 1  . rosuvastatin (CRESTOR) 20 MG tablet Take 1 tablet (20 mg total) by mouth daily. 30 tablet 12  . ticagrelor (BRILINTA) 90 MG TABS tablet Take 1 tablet (90 mg total) by mouth 2 (two) times daily. 60 tablet 1   No current facility-administered medications for this visit.   Medical History: Past Medical History:  Diagnosis Date  . Cancer [facility]) 2012   colon cancer  . Closed fracture of left distal radius   . GERD (gastroesophageal reflux disease)    Current Medications:  Outpatient Encounter Medications as of 10/22/2019  Medication Sig  . Accu-Chek Softclix Lancets lancets Use as instructed  . acetaminophen (TYLENOL) 325 MG tablet Take 2 tablets (650 mg total) by mouth every 4 (four) hours as needed for mild pain (or temp > 37.5 C (99.5 F)).  Marland Kitchen aspirin 81 MG chewable tablet Chew 1 tablet (81 mg total) by mouth daily.  . Blood Glucose Monitoring Suppl (ACCU-CHEK AVIVA CONNECT) w/Device KIT Test blood sugar 3 times daily  . glucose blood (ACCU-CHEK AVIVA PLUS) test strip Use as instructed  . Lancets Misc. (ACCU-CHEK SOFTCLIX LANCET DEV) KIT Test blood sugar 3 times daily  . metFORMIN (GLUCOPHAGE) 500 MG tablet Take 0.5 tablets (250 mg total) by mouth daily with breakfast.  . QUEtiapine (SEROQUEL) 25 MG tablet Take 1 tablet (25 mg total) by mouth at bedtime.  . rosuvastatin (CRESTOR) 20 MG tablet Take 1 tablet (20 mg total) by mouth daily.  . sertraline (ZOLOFT) 50 MG tablet Take 1 tablet (50 mg total) by mouth at bedtime.  . ticagrelor (BRILINTA) 90 MG TABS tablet  Take 1 tablet (90 mg total) by mouth 2 (two) times daily.   No facility-administered encounter medications on file as of 10/22/2019.   Objective:   General Appearance: Fairly Groomed and Guarded  Eye Contact:  Minimal  Speech:  mildly dysarthric with reduced fluency and WFD  Volume:  variable, alternates between soft, like a whisper, and loud when having outburst  Mood:  Angry, Anxious, Irritable and Worthless  Affect:  Blunt, Congruent and Constricted  Thought Process:  Coherent, Goal Directed, Irrelevant, Linear and Descriptions of Associations: Tangential  Orientation:  Full (Time, Place, and Person)  Thought Content:  Obsessions, Rumination and Tangential  Suicidal Thoughts:  No  Homicidal Thoughts:  No  Memory:  Recent;   Fair Remote; Fair  Judgement:  Poor  Insight:  Lacking  Psychomotor Activity:  Restlessness  Concentration: limited-Fair  Recall:  Poor  Fund of Knowledge:  Poor  Language:  Poor  Akathisia:  No  Handed:  Right  AIMS (if indicated):     Assets:  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   Cognition:  Impaired,  Mild  Sleep:    Intact   Assessment:   Therapeutic Interventions: Behavior modification and motivational interviewing; anger management  Participation: Active  Effectiveness:  Established problem, Unchanged (2), Review of last therapy session (1) and Review of psycho-social stressors (1). Not Met It is felt more time is needed for Interventions to work.  Homework: Incomplete. Instructed to record aggressive behavior using log provided in session.   Plan:   Next IND therapy session: 02/12/20   Plan remains the same.   Therapy Goal(s):  1. Increase ability to appropriately verbalize feelings 2. Increase emotional regulation 3. Facilitate patient progression through stages of  change regarding substance use diagnoses and concerns 4. Identify triggers associated with mental health/substance abuse issues 5. Increase skills for wellness and recovery 6. Reduce tobacco use - did not meet goal to reduce by 50% by 01/25/20;   - Updated: Reduce by 25% by 03/27/19   Alfonso Ellis 02/05/2020

## 2020-02-12 ENCOUNTER — Other Ambulatory Visit: Payer: Self-pay

## 2020-02-12 ENCOUNTER — Encounter: Payer: Medicaid Other | Admitting: Psychology

## 2020-02-12 DIAGNOSIS — F0391 Unspecified dementia with behavioral disturbance: Secondary | ICD-10-CM

## 2020-02-12 DIAGNOSIS — F1010 Alcohol abuse, uncomplicated: Secondary | ICD-10-CM

## 2020-02-12 DIAGNOSIS — F17200 Nicotine dependence, unspecified, uncomplicated: Secondary | ICD-10-CM | POA: Diagnosis not present

## 2020-02-13 ENCOUNTER — Encounter: Payer: Self-pay | Admitting: Psychology

## 2020-02-15 ENCOUNTER — Other Ambulatory Visit: Payer: Self-pay | Admitting: Adult Health

## 2020-02-18 ENCOUNTER — Encounter: Payer: Self-pay | Admitting: Adult Health

## 2020-02-20 ENCOUNTER — Encounter: Payer: Self-pay | Admitting: Psychology

## 2020-02-20 NOTE — Progress Notes (Signed)
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   Total Time spent with patient: 1 hour   Subjective:    Patient ID: Scott Day is a 59 y.o. male.  Chief Complaint: Anger and aggressive outbursts  Patient continues to have reduced insight into some of the residual deficits from his stroke along with more pronounced agitation, anger, and aggression per informant report. He is described as labile with a tendency to become very angry and threaten physical violence at times. He shows little remorse or awareness during anger outbursts and is extremely hard to redirected. The patient has difficulty when plans are changed quickly; He gets easily overwhelmed. He has recently been spending majority time hunting on his property. He is currently driving short distances in familiar areas; denies difficulty following directions, making wrong turns, getting confused/lost, or trouble operating.   Currently drinks beer (between 12-24 cans of Bud-Light) and smoking cigarettes (1-2 packs) daily.   Symptoms are currently rated Moderate-Severe. Associated signs and symptoms include: anger, attention difficulties, excessively aggressive and stubborn. There are also the following concerns:  Legal issue Occupational concerns Polysubstance abuse Other: relational conflict.   Objective:  Physical Exam Psychiatric:        Attention and Perception: He is inattentive. He does not perceive auditory or visual hallucinations.        Mood and Affect: Mood is not anxious, depressed or elated. Affect is angry. Affect is not labile, blunt, flat, tearful or inappropriate.        Speech: He is communicative. Speech is slurred (Mild  dysarthria ) and tangential. Speech is not rapid and pressured or delayed.        Behavior: Behavior is agitated, aggressive and combative. Behavior is not slowed, withdrawn or hyperactive. Behavior is cooperative.        Thought Content: Thought content is not paranoid or delusional. Thought content does not include homicidal or suicidal ideation. Thought content does not include homicidal or suicidal plan.        Cognition and Memory: Memory normal.        Judgment: Judgment is impulsive and inappropriate.    Assessment:   Major neurocognitive disorder probably due to vascular disease, with behavioral disturbance (HCC)  Tobacco use disorder  Alcohol abuse   Therapeutic Interventions: Behavior modification and motivational interviewing; anger management  Participation: Active  Effectiveness:  Established problem, Unchanged (2), Review of last therapy session (1) and Review of psycho-social stressors (1). Not Met It is felt more time is needed for Interventions to work.  Homework: Incomplete.   Plan:   Next IND therapy session: 02/21/20   Plan remains the same.   Therapy Goal(s):  1. Increase ability to appropriately verbalize feelings 2. Increase emotional regulation 3. Facilitate patient progression through stages of change regarding substance use diagnoses and concerns 4. Identify triggers associated with mental health/substance abuse issues 5. Increase skills for wellness and recovery 6. Reduce tobacco use - did not meet goal to reduce by 50% by 01/25/20;              - Updated: Reduce by 25% by 03/27/19   Scott Day 02/12/2020

## 2020-02-21 ENCOUNTER — Encounter: Payer: Medicaid Other | Attending: Physical Medicine & Rehabilitation | Admitting: Psychology

## 2020-02-21 ENCOUNTER — Other Ambulatory Visit: Payer: Self-pay

## 2020-02-21 DIAGNOSIS — Z720 Tobacco use: Secondary | ICD-10-CM | POA: Diagnosis present

## 2020-02-21 DIAGNOSIS — I69319 Unspecified symptoms and signs involving cognitive functions following cerebral infarction: Secondary | ICD-10-CM | POA: Diagnosis present

## 2020-02-21 DIAGNOSIS — R4689 Other symptoms and signs involving appearance and behavior: Secondary | ICD-10-CM | POA: Insufficient documentation

## 2020-02-21 DIAGNOSIS — F17200 Nicotine dependence, unspecified, uncomplicated: Secondary | ICD-10-CM | POA: Diagnosis present

## 2020-02-21 DIAGNOSIS — G8194 Hemiplegia, unspecified affecting left nondominant side: Secondary | ICD-10-CM | POA: Diagnosis present

## 2020-02-21 DIAGNOSIS — F1010 Alcohol abuse, uncomplicated: Secondary | ICD-10-CM | POA: Diagnosis present

## 2020-02-21 DIAGNOSIS — F0391 Unspecified dementia with behavioral disturbance: Secondary | ICD-10-CM

## 2020-02-26 ENCOUNTER — Encounter: Payer: Self-pay | Admitting: Physical Medicine & Rehabilitation

## 2020-02-26 ENCOUNTER — Ambulatory Visit: Payer: Medicaid Other | Admitting: Family Medicine

## 2020-02-26 ENCOUNTER — Other Ambulatory Visit: Payer: Self-pay

## 2020-02-26 ENCOUNTER — Encounter: Payer: Medicaid Other | Admitting: Physical Medicine & Rehabilitation

## 2020-02-26 VITALS — BP 137/90 | HR 81 | Temp 97.9°F | Ht 70.0 in | Wt 230.2 lb

## 2020-02-26 DIAGNOSIS — F0391 Unspecified dementia with behavioral disturbance: Secondary | ICD-10-CM

## 2020-02-26 DIAGNOSIS — G8194 Hemiplegia, unspecified affecting left nondominant side: Secondary | ICD-10-CM

## 2020-02-26 DIAGNOSIS — F17200 Nicotine dependence, unspecified, uncomplicated: Secondary | ICD-10-CM | POA: Diagnosis not present

## 2020-02-26 DIAGNOSIS — I69319 Unspecified symptoms and signs involving cognitive functions following cerebral infarction: Secondary | ICD-10-CM

## 2020-02-26 DIAGNOSIS — Z720 Tobacco use: Secondary | ICD-10-CM

## 2020-02-26 MED ORDER — DIVALPROEX SODIUM 250 MG PO DR TAB: 250.0000 mg | DELAYED_RELEASE_TABLET | Freq: Two times a day (BID) | ORAL | 1 refills | Status: DC

## 2020-02-26 NOTE — Progress Notes (Signed)
Subjective:    Patient ID: Scott Day, male    DOB: 05-09-1960, 59 y.o.   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    Significant other supplements history. He was last seen in clinic on 11/27/19.  Since that time, he continues to follow up with Neurology. He is working. He has had loop placed, notes reviewed. He is driving short distances. BP is relatively controlled. He is smoking 1 PPD. He is following up with Neuropsych. He is ?planning on have sleep study. Wife states anger is large issue, some baseline issues.  He also had deficits with attention.    Pain Inventory Average Pain 5 Pain Right Now 0 My pain is intermittent, dull and aching  LOCATION OF PAIN  Head  BOWEL Number of stools per week: 7-14 Oral laxative use No  Type of laxative none Enema or suppository use No  History of colostomy No  Incontinent No   BLADDER Normal In and out cath, frequency  Able to self cath No  Bladder incontinence No  Frequent urination Yes  Leakage with coughing No  Difficulty starting stream No  Incomplete bladder emptying No    Mobility walk without assistance how many minutes can you walk? 20  Function not employed: date last employed . I need assistance with the following:  meal prep, household duties and shopping  Neuro/Psych bladder control problems confusion depression anxiety  Prior Studies Any changes since last visit?  no  Physicians involved in your care Any changes since last visit?  no   History reviewed. No pertinent family history. Social History   Socioeconomic History  . Marital status: Divorced    Spouse name: Not on file  . Number of children: Not on file  . Years of education: Not on file  . Highest education level: Not on file  Occupational History  . Not on file  Tobacco Use   . Smoking status: Current Every Day Smoker    Packs/day: 1.00    Start date: 07/20/2019  . Smokeless tobacco: Never Used  . 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 Vaping Use: Former  Substance and Sexual Activity  . Alcohol use: Yes    Alcohol/week: 24.0 standard drinks    Types: 24 Cans of beer per week    Comment: 12 to 24 beers daily  . Drug use: Never  . Sexual activity: Not on file  Other Topics Concern  . Not on file  Social History Narrative  . Not on file   Social Determinants of Health   Financial Resource Strain:   . Difficulty of Paying Living Expenses: Not on file  Food Insecurity:   . Worried About Charity fundraiser in the Last Year: Not on file  . Ran Out of Food in the Last Year: Not on file  Transportation Needs:   . Lack of Transportation (Medical): Not on file  . Lack of Transportation (Non-Medical): Not on file  Physical Activity:   . Days of Exercise per Week: Not on file  . Minutes of Exercise per Session: Not on file  Stress:   . Feeling of Stress : Not on file  Social Connections:   . Frequency of Communication with Friends and Family: Not on file  . Frequency of Social Gatherings with Friends and  Family: Not on file  . Attends Religious Services: Not on file  . Active Member of Clubs or Organizations: Not on file  . Attends Archivist Meetings: Not on file  . Marital Status: Not on file   Past Surgical History:  Procedure Laterality Date  . BUBBLE STUDY  06/08/2019   Procedure: BUBBLE STUDY;  Surgeon: Buford Dresser, MD;  Location: [facility] ENDOSCOPY;  Service: Cardiovascular;;  . COLON SURGERY  2004   colon cancer  . IR ANGIO INTRA EXTRACRAN SEL COM CAROTID INNOMINATE BILAT MOD SED  01/08/2020  . IR ANGIO VERTEBRAL SEL SUBCLAVIAN INNOMINATE BILAT MOD SED  01/08/2020  . IR CT HEAD LTD  06/04/2019  . IR CT HEAD LTD  06/04/2019  . IR INTRA CRAN STENT  06/04/2019  . IR PERCUTANEOUS ART  THROMBECTOMY/INFUSION INTRACRANIAL INC DIAG ANGIO  06/04/2019  . IR US GUIDE VASC ACCESS RIGHT  06/04/2019  . OPEN REDUCTION INTERNAL FIXATION (ORIF) DISTAL RADIAL FRACTURE Left 09/20/2017   Procedure: OPEN REDUCTION INTERNAL FIXATION (ORIF) DISTAL RADIAL FRACTURE;  Surgeon: Leanora Cover, MD;  Location: [place];  Service: Orthopedics;  Laterality: Left;  . RADIOLOGY WITH ANESTHESIA N/A 06/04/2019   Procedure: IR WITH ANESTHESIA;  Surgeon: Radiologist, Medication, MD;  Location: [place];  Service: Radiology;  Laterality: N/A;  . TEE WITHOUT CARDIOVERSION N/A 06/08/2019   Procedure: TRANSESOPHAGEAL ECHOCARDIOGRAM (TEE);  Surgeon: Buford Dresser, MD;  Location: [facility] ENDOSCOPY;  Service: Cardiovascular;  Laterality: N/A;   Past Medical History:  Diagnosis Date  . Cancer [facility]) 2012   colon cancer  . Closed fracture of left distal radius   . GERD (gastroesophageal reflux disease)    BP 137/90   Pulse 81   Temp 97.9 F (36.6 C)   Ht 5\' 10"  (1.778 m)   Wt 230 lb 3.2 oz (104.4 kg)   SpO2 97%   BMI 33.03 kg/m   Opioid Risk Score:   Fall Risk Score:  `1  Depression screen PHQ 2/9  Depression screen [facility] 2/9 02/26/2020 01/18/2020 11/27/2019 08/21/2019 08/21/2019 06/26/2019  Decreased Interest 1 0 0 3 0 0  Down, Depressed, Hopeless 1 0 0 3 0 0  PHQ - 2 Score 2 0 0 6 0 0  Altered sleeping - - - - - 0  Tired, decreased energy - - - - - 0  Change in appetite - - - - - 0  Feeling bad or failure about yourself  - - - - - 0  Trouble concentrating - - - - - 3  Moving slowly or fidgety/restless - - - - - 1  Suicidal thoughts - - - - - 0  PHQ-9 Score - - - - - 4  Difficult doing work/chores - - - - - Not difficult at all   Review of Systems  Constitutional: Negative.   HENT: Negative.   Eyes: Negative.   Respiratory: Negative.   Cardiovascular: Negative.   Gastrointestinal: Negative.   Endocrine: Negative.   Genitourinary: Negative.   Musculoskeletal: Negative.   Skin:  Negative.   Allergic/Immunologic: Negative.   Neurological: Positive for dizziness and weakness.  Hematological: Negative.   Psychiatric/Behavioral: Positive for agitation, confusion and dysphoric mood. The patient is nervous/anxious.   All other systems reviewed and are negative.      Objective:   Physical Exam  Constitutional: No distress . Vital signs reviewed. HENT: Normocephalic.  Atraumatic. Eyes: EOMI. No discharge. Cardiovascular: No JVD.   Respiratory: Normal effort.  No stridor.  GI:  Non-distended.   Skin: Warm and dry.  Bulls eye rash resolved. 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     1.  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   Continue to follow up with Neurology             Released from therapies per patient, encouraged HEP             Loop in place  Driving locally at present  Will consider methylphenidate   2.  Hypertension.    Relatively controlled today.   3.  New findings of diabetes mellitus.               Relatively controlled at present   Follow up with PCP  4.  Tobacco abuse/Etoh abuse             Encouraged abstinence again, educated on smoking cessation again, smoking 1 PPD             Encouraged follow up with PCP regarding abortive options again  States he is under a lot of stress, educated   5. Mood instability - exacerbated by stressors with anger             Follow up on med recs             Continue to monitor             Continue CBT  Will order Depakote 250 BID  Seroquel per Neuro - recommended discussion  6. ?OSA  ?appointment  scheduled

## 2020-02-28 ENCOUNTER — Encounter: Payer: Medicaid Other | Admitting: Psychology

## 2020-02-28 ENCOUNTER — Other Ambulatory Visit: Payer: Self-pay

## 2020-02-28 DIAGNOSIS — F17200 Nicotine dependence, unspecified, uncomplicated: Secondary | ICD-10-CM

## 2020-02-28 DIAGNOSIS — R4689 Other symptoms and signs involving appearance and behavior: Secondary | ICD-10-CM | POA: Diagnosis not present

## 2020-02-28 DIAGNOSIS — F0391 Unspecified dementia with behavioral disturbance: Secondary | ICD-10-CM

## 2020-02-28 DIAGNOSIS — F1010 Alcohol abuse, uncomplicated: Secondary | ICD-10-CM | POA: Diagnosis not present

## 2020-02-28 NOTE — Progress Notes (Incomplete)
Subjective:    Patient ID: Scott Day is a 59 y.o. male.  Chief Complaint: HPI {Common ambulatory SmartLinks:19316} Review of Systems  Objective:  Physical Exam  Lab Review:  {Recent TELM:76151::"IDU applicable"}  Assessment:   No diagnosis found.  Plan:   ***

## 2020-02-29 ENCOUNTER — Encounter: Payer: Self-pay | Admitting: Psychology

## 2020-02-29 NOTE — Progress Notes (Addendum)
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   Total Time spent with patient: 1 hour   Subjective:    Patient ID: Scott Day is a 59 y.o. male.  Chief Complaint: Anger and aggressive outbursts  Last visit (02/12/20): Patient continues to have reduced insight into some of the residual deficits from his stroke along with more pronounced agitation, anger, and aggression per informant report. He is described as labile with a tendency to become very angry and threaten physical violence at times. He shows little remorse or awareness during anger outbursts and is extremely hard to redirected. The patient has difficulty when plans are changed quickly; He gets easily overwhelmed. He has recently been spending majority time hunting on his property. He is currently driving short distances in familiar areas; denies difficulty following directions, making wrong turns, getting confused/lost, or trouble operating.   Currently drinks beer (between 12-24 cans of Bud-Light) and smoking cigarettes (1-2 packs) daily.   Symptoms are currently rated Moderate-Severe. Associated signs and symptoms include: anger, attention difficulties, excessively aggressive and stubborn. There are also the following concerns:  Legal issue Occupational concerns Polysubstance abuse Other: relational conflict.   Current visit: He admitted to smoking cigarettes and drinking beer at the same rate and denied any changes. He reportedly wants to quit or decrease these habits but "its hard". He continued to endorse having verbal outbursts, mostly toward his partner, on daily basis but denied any physical violence. He has been trying to practice  distraction techniques taught in therapy but having trouble implementing them when "I get triggered'. He reportedly feels his partner "does it on purpose to make me mad". He has been spending more time hunting on his land to avoid altercations.    Objective:  Physical Exam Psychiatric:        Attention and Perception: He is inattentive. He does not perceive auditory or visual hallucinations.        Mood and Affect: Mood is not anxious, depressed or elated. Affect is angry. Affect is not labile, blunt, flat, tearful or inappropriate.        Speech: He is communicative. Speech is slurred (Mild dysarthria ) and tangential. Speech is not rapid and pressured or delayed.        Behavior: Behavior is agitated, aggressive and combative. Behavior is not slowed, withdrawn or hyperactive. Behavior is cooperative.     Thought Content: Thought content is somewhat paranoid but not delusional. Thought content does not include homicidal or suicidal ideation. Thought content does not include homicidal or suicidal plan.        Cognition and Memory: relatively reduced. Remote Memory intact.. Recent memory relatively reduced.     Judgment: Judgment is impulsive and inappropriate.    Assessment:   Major neurocognitive disorder probably due to vascular disease, with behavioral disturbance (HCC)  Tobacco use disorder  Alcohol abuse   Therapeutic Interventions: Behavior modification and motivational interviewing; anger management  Participation: Generally active in session   Effectiveness:  Established problem: Unchanged.  Review of last therapy session (1) and Review of psycho-social stressors (1). Goals Not Met It is felt more time is needed for Interventions to work.    Homework: Incomplete. Partner did not complete assignment due to her own medical problems  Plan:  Next IND therapy session: 02/28/20   Plan remains the same.   Therapy Goal(s):  1. Increase ability to appropriately verbalize  feelings 2. Increase emotional regulation 3. Facilitate patient progression through stages of change regarding substance use diagnoses and concerns 4. Identify triggers associated with mental health/substance abuse issues 5. Increase skills for wellness and recovery 6. Reduce tobacco use - did not meet goal to reduce by 50% by 01/25/20;              - Updated: Reduce by 25% by 03/27/19    Scott Day 02/21/2020

## 2020-03-03 ENCOUNTER — Ambulatory Visit (INDEPENDENT_AMBULATORY_CARE_PROVIDER_SITE_OTHER): Payer: Medicaid Other

## 2020-03-03 DIAGNOSIS — I639 Cerebral infarction, unspecified: Secondary | ICD-10-CM | POA: Diagnosis not present

## 2020-03-03 LAB — CUP PACEART REMOTE DEVICE CHECK
Date Time Interrogation Session: 20211211215056
Implantable Pulse Generator Implant Date: 20210924

## 2020-03-04 ENCOUNTER — Encounter: Payer: Medicaid Other | Admitting: Psychology

## 2020-03-07 ENCOUNTER — Encounter: Payer: Self-pay | Admitting: Psychology

## 2020-03-07 NOTE — Progress Notes (Addendum)
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   Total Time spent with patient: 1 hour  Scott Day presents with increased agitation and difficulty controlling behavior (e.g., daily verbal and some physical outbursts; no physical violence) after stroke on 06/04/19. Symptoms predate stroke but become more pronounced after. Clinical course is gradually improving. Aggression and verbal/behavioral outbursts typically occur in response to stressors and various triggers. Symptoms are currently rated moderate-marked. Associated signs and symptoms include: anger, excessively aggressive, out of control, poor social interaction, and stubbornness: Additional concerns include:  Financial difficulties  Health problems  Legal issue  Occupational concerns  Substance abuse  Other: Relational conflict.   Last visit (02/21/20): He admitted to smoking cigarettes and drinking beer at the same rate and denied any changes. He reportedly wants to quit or decrease these habits but "its hard". He continued to endorse having verbal outbursts, mostly toward his partner, on daily basis but denied any physical violence. He has been trying to practice distraction techniques taught in therapy but having trouble implementing them when "I get triggered'. He reportedly feels his partner "does it on purpose to make me mad". He has been spending more time hunting on his land to avoid altercations.   Current visit: Patient described having a good meeting with Dr. Posey Pronto on 02/26/20 and mentioned that "he got on my case for smoking" and "prescribed me something for my aggression" (I.e., Depakote); unable to recall name. He denied noticing any changes or side effects. He admitted to having some  outbursts during previous week but less than in recent past. He reportedly did not call partner derogatory names and stated that he has been working on avoiding confrontations with her. He denied consuming any alcohol in 5 days. Smokes at least 1 pack per day. 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   Past Medical History:  Past Medical History:  Diagnosis Date  . Cancer [facility]) 2012   colon cancer  . Closed fracture of left distal radius   . GERD (gastroesophageal reflux disease)     Past Surgical History:  Procedure Laterality Date  . BUBBLE STUDY  06/08/2019   Procedure: BUBBLE STUDY;  Surgeon: Buford Dresser, MD;  Location: [facility] ENDOSCOPY;  Service: Cardiovascular;;  . COLON SURGERY  2004   colon cancer  . IR ANGIO INTRA EXTRACRAN SEL COM CAROTID INNOMINATE BILAT MOD SED  01/08/2020  . IR ANGIO VERTEBRAL SEL SUBCLAVIAN INNOMINATE BILAT MOD SED  01/08/2020  . IR CT HEAD LTD  06/04/2019  . IR CT HEAD LTD  06/04/2019  . IR INTRA CRAN STENT  06/04/2019  . IR PERCUTANEOUS ART THROMBECTOMY/INFUSION INTRACRANIAL INC DIAG ANGIO  06/04/2019  . IR US GUIDE VASC ACCESS RIGHT  06/04/2019  . OPEN REDUCTION INTERNAL FIXATION (ORIF) DISTAL RADIAL FRACTURE Left 09/20/2017   Procedure: OPEN REDUCTION INTERNAL FIXATION (ORIF) DISTAL RADIAL FRACTURE;  Surgeon: Leanora Cover, MD;  Location: [place];  Service: Orthopedics;  Laterality: Left;  . RADIOLOGY WITH ANESTHESIA N/A 06/04/2019   Procedure: IR WITH ANESTHESIA;  Surgeon: Radiologist, Medication, MD;  Location: [place];  Service: Radiology;  Laterality: N/A;  . TEE WITHOUT CARDIOVERSION N/A 06/08/2019   Procedure: TRANSESOPHAGEAL ECHOCARDIOGRAM (TEE);  Surgeon: Buford Dresser, MD;  Location: [facility]  ENDOSCOPY;  Service: Cardiovascular;  Laterality: N/A;   Family History: History reviewed. No pertinent family history.  Social History:  Social History   Substance and  Sexual Activity  Alcohol Use Yes  . Alcohol/week: 24.0 standard drinks  . Types: 24 Cans of beer per week   Comment: 12 to 24 beers daily     Social History   Substance and Sexual Activity  Drug Use Never    Social History   Socioeconomic History  . Marital status: Divorced    Spouse name: Not on file  . Number of children: Not on file  . Years of education: Not on file  . Highest education level: Not on file  Occupational History  . Not on file  Tobacco Use  . Smoking status: Current Every Day Smoker    Packs/day: 1.00    Start date: 07/20/2019  . Smokeless tobacco: Never Used  . 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 Vaping Use: Former  Substance and Sexual Activity  . Alcohol use: Yes    Alcohol/week: 24.0 standard drinks    Types: 24 Cans of beer per week    Comment: 12 to 24 beers daily  . Drug use: Never  . Sexual activity: Not on file  Other Topics Concern  . Not on file  Social History Narrative  . Not on file   Social Determinants of Health   Financial Resource Strain: Not on file  Food Insecurity: Not on file  Transportation Needs: Not on file  Physical Activity: Not on file  Stress: Not on file  Social Connections: Not on file   Sleep: Good  Appetite:  Good   Current Medications: Current Outpatient Medications  Medication Sig Dispense Refill  . Accu-Chek Softclix Lancets lancets Use as instructed 100 each 12  . acetaminophen (TYLENOL) 500 MG tablet Take 1,000 mg by mouth every 6 (six) hours as needed for moderate pain or headache.    Marland Kitchen aspirin 81 MG chewable tablet Chew 1 tablet (81 mg total) by mouth daily.    . Blood Glucose Monitoring Suppl (ACCU-CHEK AVIVA CONNECT) w/Device KIT Test blood sugar 3 times daily 1 kit 0  . divalproex (DEPAKOTE) 250 MG DR tablet Take 1 tablet (250 mg total) by mouth 2 (two) times daily. 60 tablet 1  . FLUoxetine (PROZAC) 20 MG capsule Take 1 capsule (20 mg total) by mouth  daily. 90 capsule 3  . glucose blood (ACCU-CHEK AVIVA PLUS) test strip Use as instructed 100 each 12  . ibuprofen (ADVIL) 200 MG tablet Take 400 mg by mouth every 6 (six) hours as needed for headache or moderate pain.    . Lancets Misc. (ACCU-CHEK SOFTCLIX LANCET DEV) KIT Test blood sugar 3 times daily 1 kit 0  . metFORMIN (GLUCOPHAGE) 500 MG tablet Take 0.5 tablets (250 mg total) by mouth daily with breakfast. 30 tablet 3  . QUEtiapine (SEROQUEL) 25 MG tablet TAKE 1 TABLET(25 MG) BY MOUTH AT BEDTIME 90 tablet 1  . rosuvastatin (CRESTOR) 20 MG tablet Take 1 tablet (20 mg total) by mouth daily. 30 tablet 12  . ticagrelor (BRILINTA) 90 MG TABS tablet Take 1 tablet (90 mg total) by mouth 2 (two) times daily. 60 tablet 1   No current facility-administered medications for this visit.   Blood Alcohol level:  Lab Results  Component Value Date   ETH <10 31/54/0086   Metabolic Disorder Labs: Lab Results  Component Value Date  HGBA1C 5.7 (H) 01/18/2020   MPG 191.51 06/05/2019   No results found for: PROLACTIN Lab Results  Component Value Date   CHOL 140 09/10/2019   TRIG 177 (H) 09/10/2019   HDL 36 (L) 09/10/2019   CHOLHDL 3.9 09/10/2019   VLDL 26 06/05/2019   LDLCALC 74 09/10/2019   LDLCALC 126 (H) 06/05/2019   Psychiatric Specialty Exam: There were no vitals taken for this visit.There is no height or weight on file to calculate BMI.  General Appearance: Casual and Fairly Groomed  Eye Contact:  Good  Speech:  Normal Rate  Volume:  Normal  Mood:  Euthymic  Affect:  Appropriate  Thought Process:  Coherent and Goal Directed  Orientation:  Full (Time, Place, and Person)  Thought Content:  Logical, Rumination and Tangential  Suicidal Thoughts:  No  Homicidal Thoughts:  No  Memory:  Immediate;   Fair Recent;   Fair Remote;   Good  Judgement:  Poor  Insight:  Shallow  Psychomotor Activity:  Restlessness  Concentration:  Concentration: Fair and Attention Span: Fair  Recall:  [employer] of Knowledge:  Fair  Language:  Fair  Akathisia:  No  Handed:  Right  AIMS (if indicated):     Assets:  Housing Leisure Time Resilience Social Support Talents/Skills Transportation  ADL's:  Requires assistance with some instrumental activities    Therapeutic Interventions:  Behavior modification, Behavior therapy and Other: anger management    Participation: Active in session   Effectiveness: Established problem, stable/improving (1), Review of last therapy session (1) and Review of psycho-social stressors (1). Slowly Progressing It is felt more time is needed for Interventions to work  Treatment Goals:  Increase social support, Increase ability to appropriately verbalize feelings, Increase emotional regulation, Facilitate patient progression through stages of change regarding substance use diagnoses and concerns, Identify triggers associated with mental health/substance abuse issues and Increase skills for wellness and recovery   1. Reduce verbal outbursts to once per day  Met:  No  Target date:03/27/19  As evidenced by: Partner's report. She has been instructed to record outbursts week to week to objectively monitor aggressive behavior; this has not been completed thus far.   2. Reduce alcohol use 50%     Met:  No   Target date:03/27/19  As evidenced by: Self report;  less than 12-pack of beer per day (e.g ., currently reports drinking 12-24 beers most days)   3. Reduce tobacco use 25%    Met:  No  Target date:03/27/19  As evidenced by: Self-report; Will report smoking 1 pack or less per day for 1 full week.    Plan:   Continue weekly-biweekly individual psychotherapy to assess and evaluate symptoms and progress in treatment and work on goals outlined in plan.  Return Visit scheduled for 03/11/20   Alfonso Ellis 02/28/2020

## 2020-03-11 ENCOUNTER — Encounter: Payer: Medicaid Other | Admitting: Psychology

## 2020-03-12 ENCOUNTER — Ambulatory Visit: Payer: Medicaid Other | Admitting: Adult Health

## 2020-03-13 NOTE — Progress Notes (Signed)
Carelink Summary Report / Loop Recorder 

## 2020-03-18 ENCOUNTER — Encounter: Payer: Medicaid Other | Admitting: Psychology

## 2020-03-18 ENCOUNTER — Other Ambulatory Visit: Payer: Self-pay

## 2020-03-18 DIAGNOSIS — F0391 Unspecified dementia with behavioral disturbance: Secondary | ICD-10-CM

## 2020-03-18 DIAGNOSIS — F17200 Nicotine dependence, unspecified, uncomplicated: Secondary | ICD-10-CM | POA: Diagnosis not present

## 2020-03-18 DIAGNOSIS — F1010 Alcohol abuse, uncomplicated: Secondary | ICD-10-CM

## 2020-03-18 DIAGNOSIS — R4689 Other symptoms and signs involving appearance and behavior: Secondary | ICD-10-CM | POA: Diagnosis not present

## 2020-03-20 ENCOUNTER — Encounter: Payer: Self-pay | Admitting: Psychology

## 2020-03-20 NOTE — Progress Notes (Addendum)
  Time spent with patient: 60 min   Subjective:    Patient ID: Scott Day is a 59 y.o. male.  Chief Complaint: Aggression and anger; relational conflict   Objective:  Physical Exam Psychiatric:        Attention and Perception: He is inattentive. He does not perceive auditory or visual hallucinations.        Mood and Affect: Mood is depressed (mild). Mood is not anxious or elated. Affect is angry (mild at times). Affect is not labile, blunt, flat, tearful or inappropriate.        Speech: He is communicative. Speech is tangential. Speech is not rapid and pressured, delayed or slurred.        Behavior: Behavior is agitated, aggressive and combative. Behavior is not slowed, withdrawn or hyperactive. Behavior is cooperative.        Thought Content: Thought content is paranoid (mild). Thought content is not delusional. Thought content does not include homicidal or suicidal ideation. Thought content does not include homicidal or suicidal plan.        Cognition and Memory: Cognition is impaired. He exhibits impaired recent memory. He does not exhibit impaired remote memory.        Judgment: Judgment is impulsive and inappropriate.    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     Minimal progress has been made thus far in reducing verbal outbursts and attacks in current intimate relationship. There have been physical outbursts (e.g., breaking items) but no acts of violence reported. Several attempts have been made to generate baseline frequency of aggressive behaviors during the week, but to no avail. His partner believes things are generally worse  overall. Patient displays some insight and agrees with this, but to a degree. He blames most of his behaviors on his partner but admits that many of his typical responses escalate most situations.   With regards to using/abusing alcohol and other non-prescription substances, he continues to drink and smoke cigarettes daily; no real change reported. He denied using other illicit substances, representing some relative improvement.   He continues to describe having moderate desire to stop smoking cigarettes but does not really believe current alcohol consumption is problematic (`~dozen 12 oz. cans Bud Light).   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 He admitted to having a few verbal altercations while riding to work and on the job, but denied any physical fighting or assaults. He anticipates working on this project for around 8-12 months. Subsequent work remains uncertain.    Plan:   We have 1 more scheduled appointment (03/27/19). Plan to review/update goals and progress in therapy. Assess need for continued services.

## 2020-03-24 ENCOUNTER — Ambulatory Visit: Payer: Medicaid Other | Admitting: Gastroenterology

## 2020-03-25 ENCOUNTER — Encounter: Payer: Medicaid Other | Admitting: Physical Medicine & Rehabilitation

## 2020-03-26 ENCOUNTER — Encounter: Payer: Medicaid Other | Admitting: Psychology

## 2020-03-28 ENCOUNTER — Telehealth (HOSPITAL_COMMUNITY): Payer: Self-pay | Admitting: Radiology

## 2020-03-28 NOTE — Telephone Encounter (Signed)
Pt needing refill of Brilinta 90mg  1 tablet b.i.d. #60 x6 refills Per Deveshwar. Script was called in to [facility] in [place] [phone].

## 2020-04-07 ENCOUNTER — Ambulatory Visit (INDEPENDENT_AMBULATORY_CARE_PROVIDER_SITE_OTHER): Payer: Medicaid Other

## 2020-04-07 DIAGNOSIS — I639 Cerebral infarction, unspecified: Secondary | ICD-10-CM | POA: Diagnosis not present

## 2020-04-07 LAB — CUP PACEART REMOTE DEVICE CHECK
Date Time Interrogation Session: 20220113215056
Implantable Pulse Generator Implant Date: 20210924

## 2020-04-10 ENCOUNTER — Ambulatory Visit: Payer: Medicaid Other | Admitting: Adult Health

## 2020-04-11 ENCOUNTER — Other Ambulatory Visit: Payer: Self-pay | Admitting: Physical Medicine & Rehabilitation

## 2020-04-14 ENCOUNTER — Other Ambulatory Visit: Payer: Self-pay

## 2020-04-14 ENCOUNTER — Encounter: Payer: Self-pay | Admitting: Physical Medicine & Rehabilitation

## 2020-04-14 ENCOUNTER — Encounter: Payer: Medicaid Other | Attending: Physical Medicine & Rehabilitation | Admitting: Physical Medicine & Rehabilitation

## 2020-04-14 VITALS — BP 141/88 | HR 90 | Temp 97.8°F | Ht 70.0 in | Wt 237.0 lb

## 2020-04-14 DIAGNOSIS — F17200 Nicotine dependence, unspecified, uncomplicated: Secondary | ICD-10-CM | POA: Insufficient documentation

## 2020-04-14 DIAGNOSIS — I69319 Unspecified symptoms and signs involving cognitive functions following cerebral infarction: Secondary | ICD-10-CM | POA: Insufficient documentation

## 2020-04-14 DIAGNOSIS — I63511 Cerebral infarction due to unspecified occlusion or stenosis of right middle cerebral artery: Secondary | ICD-10-CM | POA: Insufficient documentation

## 2020-04-14 DIAGNOSIS — R4586 Emotional lability: Secondary | ICD-10-CM | POA: Diagnosis present

## 2020-04-14 DIAGNOSIS — F0391 Unspecified dementia with behavioral disturbance: Secondary | ICD-10-CM | POA: Diagnosis not present

## 2020-04-14 MED ORDER — DIVALPROEX SODIUM ER 250 MG PO TB24: 250.0000 mg | ORAL_TABLET | Freq: Every day | ORAL | 1 refills | Status: DC

## 2020-04-14 NOTE — Progress Notes (Signed)
Subjective:    Patient ID: Scott Day, male    DOB: 06-14-1960, 60 y.o.   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    Significant other supplements history. He was last seen in clinic on 02/26/20.  Since that time, patient states he is doing HEP. BP is controlled. He is taking Depakote and Seroquel inconsistently.  Continues to smoke 1 PPD. He has not been able to follow up with CBT. He is attempting to schedule sleep study. Attempting to apply for SSD.  Pain Inventory Average Pain 5 Pain Right Now 0 My pain is intermittent, dull and aching  LOCATION OF PAIN  Head  BOWEL Number of stools per week: 7-14 Oral laxative use No  Type of laxative none Enema or suppository use No  History of colostomy No  Incontinent No   BLADDER Normal In and out cath, frequency  Able to self cath No  Bladder incontinence No  Frequent urination Yes  Leakage with coughing No  Difficulty starting stream No  Incomplete bladder emptying No    Mobility walk without assistance how many minutes can you walk? 20  Function not employed: date last employed . I need assistance with the following:  meal prep, household duties and shopping  Neuro/Psych bladder control problems confusion depression anxiety  Prior Studies Any changes since last visit?  no  Physicians involved in your care Any changes since last visit?  no   No family history on file. Social History   Socioeconomic History  . Marital status: Divorced    Spouse name: Not on file  . Number of children: Not on file  . Years of education: Not on file  . Highest education level: Not on file  Occupational History  . Not on file  Tobacco Use  . Smoking status: Current Every Day Smoker    Packs/day: 1.00    Start date: 07/20/2019  . Smokeless tobacco: Never  Used  . 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 Vaping Use: Former  Substance and Sexual Activity  . Alcohol use: Yes    Alcohol/week: 24.0 standard drinks    Types: 24 Cans of beer per week    Comment: 12 to 24 beers daily  . Drug use: Never  . Sexual activity: Not on file  Other Topics Concern  . Not on file  Social History Narrative  . Not on file   Social Determinants of Health   Financial Resource Strain: Not on file  Food Insecurity: Not on file  Transportation Needs: Not on file  Physical Activity: Not on file  Stress: Not on file  Social Connections: Not on file   Past Surgical History:  Procedure Laterality Date  . BUBBLE STUDY  06/08/2019   Procedure: BUBBLE STUDY;  Surgeon: Buford Dresser, MD;  Location: [facility] ENDOSCOPY;  Service: Cardiovascular;;  . COLON SURGERY  2004   colon cancer  . IR ANGIO INTRA EXTRACRAN SEL COM CAROTID INNOMINATE BILAT MOD SED  01/08/2020  . IR ANGIO VERTEBRAL SEL SUBCLAVIAN INNOMINATE BILAT MOD SED  01/08/2020  . IR CT HEAD LTD  06/04/2019  . IR CT HEAD LTD  06/04/2019  . IR INTRA CRAN STENT  06/04/2019  . IR PERCUTANEOUS ART THROMBECTOMY/INFUSION INTRACRANIAL INC DIAG ANGIO  06/04/2019  . IR US GUIDE VASC ACCESS  RIGHT  06/04/2019  . OPEN REDUCTION INTERNAL FIXATION (ORIF) DISTAL RADIAL FRACTURE Left 09/20/2017   Procedure: OPEN REDUCTION INTERNAL FIXATION (ORIF) DISTAL RADIAL FRACTURE;  Surgeon: Leanora Cover, MD;  Location: [place];  Service: Orthopedics;  Laterality: Left;  . RADIOLOGY WITH ANESTHESIA N/A 06/04/2019   Procedure: IR WITH ANESTHESIA;  Surgeon: Radiologist, Medication, MD;  Location: [place];  Service: Radiology;  Laterality: N/A;  . TEE WITHOUT CARDIOVERSION N/A 06/08/2019   Procedure: TRANSESOPHAGEAL ECHOCARDIOGRAM (TEE);  Surgeon: Buford Dresser, MD;  Location: [facility] ENDOSCOPY;  Service: Cardiovascular;  Laterality: N/A;   Past Medical History:  Diagnosis Date   . Cancer [facility]) 2012   colon cancer  . Closed fracture of left distal radius   . GERD (gastroesophageal reflux disease)    BP (!) 141/88   Pulse 90   Temp 97.8 F (36.6 C)   Ht 5\' 10"  (1.778 m)   Wt 237 lb (107.5 kg)   SpO2 94%   BMI 34.01 kg/m   Opioid Risk Score:   Fall Risk Score:  `1  Depression screen PHQ 2/9  Depression screen [facility] 2/9 04/14/2020 02/26/2020 01/18/2020 11/27/2019 08/21/2019 08/21/2019 06/26/2019  Decreased Interest 1 1 0 0 3 0 0  Down, Depressed, Hopeless 1 1 0 0 3 0 0  PHQ - 2 Score 2 2 0 0 6 0 0  Altered sleeping - - - - - - 0  Tired, decreased energy - - - - - - 0  Change in appetite - - - - - - 0  Feeling bad or failure about yourself  - - - - - - 0  Trouble concentrating - - - - - - 3  Moving slowly or fidgety/restless - - - - - - 1  Suicidal thoughts - - - - - - 0  PHQ-9 Score - - - - - - 4  Difficult doing work/chores - - - - - - Not difficult at all   Review of Systems  Constitutional: Negative.   HENT: Negative.   Eyes: Negative.   Respiratory: Negative.   Cardiovascular: Negative.   Gastrointestinal: Negative.   Endocrine: Negative.   Genitourinary: Negative.   Musculoskeletal: Negative.   Skin: Negative.   Allergic/Immunologic: Negative.   Neurological: Positive for dizziness and weakness.  Hematological: Negative.   Psychiatric/Behavioral: Positive for agitation, confusion, dysphoric mood and sleep disturbance. The patient is nervous/anxious.   All other systems reviewed and are negative.     Objective:   Physical Exam  Constitutional: No distress . Vital signs reviewed. HENT: Normocephalic.  Atraumatic. Eyes: EOMI. No discharge. Cardiovascular: No JVD.  RRR. Respiratory: Normal effort.  No stridor.  Bilateral clear to auscultation. GI: Non-distended.  BS +. Skin: Warm and dry.  Intact. Psych: Delayed. Musc: No edema in extremities.  No tenderness in extremities. 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     1.  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   Continue to follow up with Neurology             Released from therapies per patient, encouraged HEP  Loop in place  Driving locally at present  Will consider methylphenidate - await for sleep study and med changes  Attempting to apply for SSD.  2.  Hypertension.    Relatively controlled today.   3.  New findings of diabetes mellitus.               Relatively controlled at present   Follow up with PCP  4.  Tobacco abuse/Etoh abuse             Encouraged abstinence again, educated on smoking cessation again, continues to smoke 1 PPD             Encouraged follow up with PCP regarding abortive options again   5. Mood instability - exacerbated by stressors with anger             Follow up on med recs             Continue to monitor             Continue CBT  Will change Depakote 250 BID to ER  Seroquel per Neuro - recommended discussion, appointment in Feb  6. ?OSA  ?awaiting appointment

## 2020-04-16 ENCOUNTER — Ambulatory Visit: Payer: Medicaid Other | Admitting: Family Medicine

## 2020-04-19 NOTE — Progress Notes (Signed)
Carelink Summary Report / Loop Recorder 

## 2020-04-21 ENCOUNTER — Other Ambulatory Visit: Payer: Self-pay

## 2020-04-21 ENCOUNTER — Ambulatory Visit: Payer: Medicaid Other | Attending: Family Medicine | Admitting: Neurology

## 2020-04-21 ENCOUNTER — Encounter (HOSPITAL_BASED_OUTPATIENT_CLINIC_OR_DEPARTMENT_OTHER): Payer: Medicaid Other | Admitting: Internal Medicine

## 2020-04-21 DIAGNOSIS — R5383 Other fatigue: Secondary | ICD-10-CM

## 2020-04-21 DIAGNOSIS — R0683 Snoring: Secondary | ICD-10-CM | POA: Diagnosis present

## 2020-04-21 DIAGNOSIS — G4733 Obstructive sleep apnea (adult) (pediatric): Secondary | ICD-10-CM | POA: Insufficient documentation

## 2020-04-24 ENCOUNTER — Encounter: Payer: Self-pay | Admitting: Family Medicine

## 2020-04-29 ENCOUNTER — Encounter: Payer: Medicaid Other | Attending: Physical Medicine & Rehabilitation | Admitting: Psychology

## 2020-04-29 ENCOUNTER — Encounter: Payer: Self-pay | Admitting: Gastroenterology

## 2020-04-29 ENCOUNTER — Ambulatory Visit: Payer: Medicaid Other | Admitting: Gastroenterology

## 2020-04-29 ENCOUNTER — Other Ambulatory Visit: Payer: Self-pay

## 2020-04-29 ENCOUNTER — Encounter: Payer: Self-pay | Admitting: Family Medicine

## 2020-04-29 ENCOUNTER — Telehealth: Payer: Self-pay

## 2020-04-29 ENCOUNTER — Ambulatory Visit: Payer: Medicaid Other | Attending: Family Medicine | Admitting: Family Medicine

## 2020-04-29 ENCOUNTER — Encounter: Payer: Self-pay | Admitting: Psychology

## 2020-04-29 VITALS — BP 148/86 | HR 75 | Temp 96.6°F | Ht 70.0 in | Wt 235.4 lb

## 2020-04-29 VITALS — BP 151/85 | HR 76 | Ht 70.0 in | Wt 236.4 lb

## 2020-04-29 DIAGNOSIS — F17200 Nicotine dependence, unspecified, uncomplicated: Secondary | ICD-10-CM | POA: Insufficient documentation

## 2020-04-29 DIAGNOSIS — R110 Nausea: Secondary | ICD-10-CM

## 2020-04-29 DIAGNOSIS — I63511 Cerebral infarction due to unspecified occlusion or stenosis of right middle cerebral artery: Secondary | ICD-10-CM

## 2020-04-29 DIAGNOSIS — H539 Unspecified visual disturbance: Secondary | ICD-10-CM

## 2020-04-29 DIAGNOSIS — R1013 Epigastric pain: Secondary | ICD-10-CM

## 2020-04-29 DIAGNOSIS — Z720 Tobacco use: Secondary | ICD-10-CM

## 2020-04-29 DIAGNOSIS — I69319 Unspecified symptoms and signs involving cognitive functions following cerebral infarction: Secondary | ICD-10-CM | POA: Insufficient documentation

## 2020-04-29 DIAGNOSIS — I63311 Cerebral infarction due to thrombosis of right middle cerebral artery: Secondary | ICD-10-CM

## 2020-04-29 DIAGNOSIS — E119 Type 2 diabetes mellitus without complications: Secondary | ICD-10-CM

## 2020-04-29 DIAGNOSIS — R197 Diarrhea, unspecified: Secondary | ICD-10-CM

## 2020-04-29 DIAGNOSIS — K219 Gastro-esophageal reflux disease without esophagitis: Secondary | ICD-10-CM | POA: Diagnosis not present

## 2020-04-29 DIAGNOSIS — C189 Malignant neoplasm of colon, unspecified: Secondary | ICD-10-CM

## 2020-04-29 DIAGNOSIS — H918X3 Other specified hearing loss, bilateral: Secondary | ICD-10-CM

## 2020-04-29 DIAGNOSIS — F0391 Unspecified dementia with behavioral disturbance: Secondary | ICD-10-CM | POA: Insufficient documentation

## 2020-04-29 DIAGNOSIS — E1159 Type 2 diabetes mellitus with other circulatory complications: Secondary | ICD-10-CM

## 2020-04-29 DIAGNOSIS — R4689 Other symptoms and signs involving appearance and behavior: Secondary | ICD-10-CM | POA: Diagnosis not present

## 2020-04-29 DIAGNOSIS — Z1159 Encounter for screening for other viral diseases: Secondary | ICD-10-CM

## 2020-04-29 DIAGNOSIS — I152 Hypertension secondary to endocrine disorders: Secondary | ICD-10-CM

## 2020-04-29 LAB — POCT GLYCOSYLATED HEMOGLOBIN (HGB A1C): HbA1c, POC (controlled diabetic range): 6.3 % (ref 0.0–7.0)

## 2020-04-29 LAB — GLUCOSE, POCT (MANUAL RESULT ENTRY): POC Glucose: 161 mg/dl — AB (ref 70–99)

## 2020-04-29 MED ORDER — METFORMIN HCL 500 MG PO TABS: 250.0000 mg | ORAL_TABLET | Freq: Every day | ORAL | 6 refills | Status: DC

## 2020-04-29 MED ORDER — LISINOPRIL 2.5 MG PO TABS: 2.5000 mg | ORAL_TABLET | Freq: Every day | ORAL | 1 refills | Status: DC

## 2020-04-29 MED ORDER — PANTOPRAZOLE SODIUM 40 MG PO TBEC: 40.0000 mg | DELAYED_RELEASE_TABLET | Freq: Every day | ORAL | 1 refills | Status: DC

## 2020-04-29 MED ORDER — BUPROPION HCL ER (XL) 150 MG PO TB24
150.0000 mg | ORAL_TABLET | Freq: Every day | ORAL | 6 refills | Status: DC
Start: 2020-04-29 — End: 2020-08-19

## 2020-04-29 NOTE — Patient Instructions (Signed)
Steps to Quit Smoking Smoking tobacco is the leading cause of preventable death. It can affect almost every organ in the body. Smoking puts you and people around you at risk for many serious, long-lasting (chronic) diseases. Quitting smoking can be hard, but it is one of the best things that you can do for your health. It is never too late to quit. How do I get ready to quit? When you decide to quit smoking, make a plan to help you succeed. Before you quit:  Pick a date to quit. Set a date within the next 2 weeks to give you time to prepare.  Write down the reasons why you are quitting. Keep this list in places where you will see it often.  Tell your family, friends, and co-workers that you are quitting. Their support is important.  Talk with your doctor about the choices that may help you quit.  Find out if your health insurance will pay for these treatments.  Know the people, places, things, and activities that make you want to smoke (triggers). Avoid them. What first steps can I take to quit smoking?  Throw away all cigarettes at home, at work, and in your car.  Throw away the things that you use when you smoke, such as ashtrays and lighters.  Clean your car. Make sure to empty the ashtray.  Clean your home, including curtains and carpets. What can I do to help me quit smoking? Talk with your doctor about taking medicines and seeing a counselor at the same time. You are more likely to succeed when you do both.  If you are pregnant or breastfeeding, talk with your doctor about counseling or other ways to quit smoking. Do not take medicine to help you quit smoking unless your doctor tells you to do so. To quit smoking: Quit right away  Quit smoking totally, instead of slowly cutting back on how much you smoke over a period of time.  Go to counseling. You are more likely to quit if you go to counseling sessions regularly. Take medicine You may take medicines to help you quit. Some  medicines need a prescription, and some you can buy over-the-counter. Some medicines may contain a drug called nicotine to replace the nicotine in cigarettes. Medicines may:  Help you to stop having the desire to smoke (cravings).  Help to stop the problems that come when you stop smoking (withdrawal symptoms). Your doctor may ask you to use:  Nicotine patches, gum, or lozenges.  Nicotine inhalers or sprays.  Non-nicotine medicine that is taken by mouth. Find resources Find resources and other ways to help you quit smoking and remain smoke-free after you quit. These resources are most helpful when you use them often. They include:  Online chats with a counselor.  Phone quitlines.  Printed self-help materials.  Support groups or group counseling.  Text messaging programs.  Mobile phone apps. Use apps on your mobile phone or tablet that can help you stick to your quit plan. There are many free apps for mobile phones and tablets as well as websites. Examples include Quit Guide from the CDC and smokefree.gov   What things can I do to make it easier to quit?  Talk to your family and friends. Ask them to support and encourage you.  Call a phone quitline (1-800-QUIT-NOW), reach out to support groups, or work with a counselor.  Ask people who smoke to not smoke around you.  Avoid places that make you want to smoke,   such as: ? Bars. ? Parties. ? Smoke-break areas at work.  Spend time with people who do not smoke.  Lower the stress in your life. Stress can make you want to smoke. Try these things to help your stress: ? Getting regular exercise. ? Doing deep-breathing exercises. ? Doing yoga. ? Meditating. ? Doing a body scan. To do this, close your eyes, focus on one area of your body at a time from head to toe. Notice which parts of your body are tense. Try to relax the muscles in those areas.   How will I feel when I quit smoking? Day 1 to 3 weeks Within the first 24 hours,  you may start to have some problems that come from quitting tobacco. These problems are very bad 2-3 days after you quit, but they do not often last for more than 2-3 weeks. You may get these symptoms:  Mood swings.  Feeling restless, nervous, angry, or annoyed.  Trouble concentrating.  Dizziness.  Strong desire for high-sugar foods and nicotine.  Weight gain.  Trouble pooping (constipation).  Feeling like you may vomit (nausea).  Coughing or a sore throat.  Changes in how the medicines that you take for other issues work in your body.  Depression.  Trouble sleeping (insomnia). Week 3 and afterward After the first 2-3 weeks of quitting, you may start to notice more positive results, such as:  Better sense of smell and taste.  Less coughing and sore throat.  Slower heart rate.  Lower blood pressure.  Clearer skin.  Better breathing.  Fewer sick days. Quitting smoking can be hard. Do not give up if you fail the first time. Some people need to try a few times before they succeed. Do your best to stick to your quit plan, and talk with your doctor if you have any questions or concerns. Summary  Smoking tobacco is the leading cause of preventable death. Quitting smoking can be hard, but it is one of the best things that you can do for your health.  When you decide to quit smoking, make a plan to help you succeed.  Quit smoking right away, not slowly over a period of time.  When you start quitting, seek help from your doctor, family, or friends. This information is not intended to replace advice given to you by your health care provider. Make sure you discuss any questions you have with your health care provider. Document Revised: 12/01/2018 Document Reviewed: 05/27/2018 Elsevier Patient Education  2021 Elsevier Inc.  

## 2020-04-29 NOTE — Procedures (Signed)
[place] A. Merlene Laughter, MD     [email or web address]             NOCTURNAL POLYSOMNOGRAPHY   LOCATION: [facility]  Patient Name: Scott Day, Scott Day Date: 04/21/2020 Gender: Male D.O.B: [birth year] Age (years): 40 Referring Provider: Cammie Fulp Height (inches): 70 Interpreting Physician: Phillips Odor MD, ABSM Weight (lbs): 237 RPSGT: Rosebud Poles BMI: 34 MRN: [id number] Neck Size: 20.00 CLINICAL INFORMATION Sleep Study Type: Split Night CPAP     Indication for sleep study: N/A     Epworth Sleepiness Score: 15  SLEEP STUDY TECHNIQUE As per the AASM Manual for the Scoring of Sleep and Associated Events v2.3 (April 2016) with a hypopnea requiring 4% desaturations.  The channels recorded and monitored were frontal, central and occipital EEG, electrooculogram (EOG), submentalis EMG (chin), nasal and oral airflow, thoracic and abdominal wall motion, anterior tibialis EMG, snore microphone, electrocardiogram, and pulse oximetry. Continuous positive airway pressure (CPAP) was initiated when the patient met split night criteria and was titrated according to treat sleep-disordered breathing.  MEDICATIONS Medications self-administered by patient taken the night of the study : N/A  Current Outpatient Medications:  .  Accu-Chek Softclix Lancets lancets, Use as instructed, Disp: 100 each, Rfl: 12 .  acetaminophen (TYLENOL) 500 MG tablet, Take 1,000 mg by mouth every 6 (six) hours as needed for moderate pain or headache., Disp: , Rfl:  .  aspirin 81 MG chewable tablet, Chew 1 tablet (81 mg total) by mouth daily., Disp:  , Rfl:  .  Blood Glucose Monitoring Suppl (ACCU-CHEK AVIVA CONNECT) w/Device KIT, Test blood sugar 3 times daily, Disp: 1 kit, Rfl: 0 .  buPROPion (WELLBUTRIN XL) 150 MG 24 hr tablet, Take 1 tablet (150 mg total) by mouth daily., Disp: 30 tablet, Rfl: 6 .  divalproex (DEPAKOTE ER) 250 MG 24 hr tablet, Take 1 tablet (250 mg total) by mouth  daily., Disp: 30 tablet, Rfl: 1 .  FLUoxetine (PROZAC) 20 MG capsule, Take 1 capsule (20 mg total) by mouth daily., Disp: 90 capsule, Rfl: 3 .  glucose blood (ACCU-CHEK AVIVA PLUS) test strip, Use as instructed, Disp: 100 each, Rfl: 12 .  ibuprofen (ADVIL) 200 MG tablet, Take 400 mg by mouth every 6 (six) hours as needed for headache or moderate pain., Disp: , Rfl:  .  Lancets Misc. (ACCU-CHEK SOFTCLIX LANCET DEV) KIT, Test blood sugar 3 times daily, Disp: 1 kit, Rfl: 0 .  lisinopril (ZESTRIL) 2.5 MG tablet, Take 1 tablet (2.5 mg total) by mouth daily., Disp: 90 tablet, Rfl: 1 .  metFORMIN (GLUCOPHAGE) 500 MG tablet, Take 0.5 tablets (250 mg total) by mouth daily with breakfast., Disp: 30 tablet, Rfl: 6 .  pantoprazole (PROTONIX) 40 MG tablet, Take 1 tablet (40 mg total) by mouth daily before breakfast., Disp: 90 tablet, Rfl: 1 .  QUEtiapine (SEROQUEL) 25 MG tablet, TAKE 1 TABLET(25 MG) BY MOUTH AT BEDTIME, Disp: 90 tablet, Rfl: 1 .  rosuvastatin (CRESTOR) 20 MG tablet, Take 1 tablet (20 mg total) by mouth daily., Disp: 30 tablet, Rfl: 12 .  ticagrelor (BRILINTA) 90 MG TABS tablet, Take 1 tablet (90 mg total) by mouth 2 (two) times daily., Disp: 60 tablet, Rfl: 1   RESPIRATORY PARAMETERS Diagnostic  Total AHI (/hr): 51.7 RDI (/hr): 51.7 OA Index (/hr): 12.8 CA Index (/hr): 4.0 REM AHI (/hr): 54.2 NREM AHI (/hr): 51.5 Supine AHI (/hr): 89.3 Non-supine AHI (/hr): 1.7 Min O2 Sat (%): 79.0 Mean O2 (%): 93.2 Time below 88% (min): 3.4  Titration  Optimal Pressure (cm):  AHI at Optimal Pressure (/hr): N/A Min O2 at Optimal Pressure (%): 87.0 Supine % at Optimal (%): N/A Sleep % at Optimal (%): N/A   SLEEP ARCHITECTURE The recording time for the entire night was 464.8 minutes.  During a baseline period of 170.0 minutes, the patient slept for 163.5 minutes in REM and nonREM, yielding a sleep efficiency of 96.2%%. Sleep onset after lights out was 0.8 minutes with a REM latency of 87.5 minutes. The  patient spent 0.0%% of the night in stage N1 sleep, 38.2%% in stage N2 sleep, 52.3%% in stage N3 and 9.5% in REM. The patient was titrated between pressures of 5 and 10 but with only modest response.     During the titration period of 291.8 minutes, the patient slept for 281.5 minutes in REM and nonREM, yielding a sleep efficiency of 96.5%%. Sleep onset after CPAP initiation was 1.8 minutes with a REM latency of 33.0 minutes. The patient spent 0.2%% of the night in stage N1 sleep, 44.6%% in stage N2 sleep, 19.0%% in stage N3 and 36.2% in REM.  CARDIAC DATA The 2 lead EKG demonstrated sinus rhythm. The mean heart rate was 100.0 beats per minute. Other EKG findings include: None.  LEG MOVEMENT DATA The total Periodic Limb Movements of Sleep (PLMS) were 0. The PLMS index was 0.0.  IMPRESSIONS 1.  Severe obstructive sleep apnea syndrome is documented with this study. The severity is worse during REM sleep. There is only moderate response to CPAP with the time given for the study. As a result, auto PAP 8-15 is recommended.   Delano Metz, MD Diplomate, American Board of Sleep Medicine.  ELECTRONICALLY SIGNED ON:  04/29/2020, 5:02 PM [place] PH: [phone]   FX: [phone] [place]

## 2020-04-29 NOTE — Progress Notes (Signed)
Discuss sleep study results.

## 2020-04-29 NOTE — Progress Notes (Signed)
Primary Care Physician:  Charlott Rakes, MD, (Dr. Antony Blackbird)  Primary Gastroenterologist:  Garfield Cornea, MD  Chief Complaint  Patient presents with  . Colonoscopy    Consult, hx CRC-surgery and chemo 2003  . Nausea  . Diarrhea    HPI:  Scott Day is a 61 y.o. male here at the request of Dr. Antony Blackbird for history of colon cancer. Patient presents with girlfriend, Amy.   Patient was diagnosed with colon cancer in 2004.  Had near obstructing mass at the cecum.  Underwent right hemicolectomy and chemotherapy.  Pathology showed no tumor in 22 pericolonic lymph nodes.  His last surveillance colonoscopy was in 2011.  He had 5 hyperplastic polyps removed at the time.  Patient failed to follow-up with surveillance colonoscopies.  Presents with 2-year history of intermittent diarrhea.  Symptoms similar to when he presented with colon cancer.  Diarrhea may last for several days at a time and then checks up for a bit.  Never has a solid stool however.  On good days has just a couple of BMs other days has more than 5.  Some nocturnal diarrhea.  Occasional bright red blood with wiping.  Typically feels like his hemorrhoids are flared when he sees blood.  Central abdominal pain and bloating.  He is not sure what seems to make it better or worse.  Heartburn off and on for more than a year.  He takes Tums as needed.  Triggered by certain foods.  Has to sleep sitting upright.  Wakes up coughing and regurgitating acid. Denies dysphagia.  No vomiting.  Feels nauseated frequently.  Occurs every few days.  Patient had a stroke in March 2021.  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  Followed by Dr. Estanislado Pandy.  Last imaging in October 2021 showed approximately 50 to 60% intra stent stenosis of the right MCA.  Patient is on Brilinta.  Has follow-up imaging in April.  Patient reports drinking alcohol 3 to 4 days/week.  Some days consumes three to four 12 ounce beers.   Other days drinks up to a 12 pack or more.  States he has cut back since last year.  Continues to smoke.   Takes ibuprofen couple of times per week.  Current Outpatient Medications  Medication Sig Dispense Refill  . Accu-Chek Softclix Lancets lancets Use as instructed 100 each 12  . acetaminophen (TYLENOL) 500 MG tablet Take 1,000 mg by mouth every 6 (six) hours as needed for moderate pain or headache.    Marland Kitchen aspirin 81 MG chewable tablet Chew 1 tablet (81 mg total) by mouth daily.    . Blood Glucose Monitoring Suppl (ACCU-CHEK AVIVA CONNECT) w/Device KIT Test blood sugar 3 times daily 1 kit 0  . divalproex (DEPAKOTE ER) 250 MG 24 hr tablet Take 1 tablet (250 mg total) by mouth daily. 30 tablet 1  . FLUoxetine (PROZAC) 20 MG capsule Take 1 capsule (20 mg total) by mouth daily. 90 capsule 3  . glucose blood (ACCU-CHEK AVIVA PLUS) test strip Use as instructed 100 each 12  . ibuprofen (ADVIL) 200 MG tablet Take 400 mg by mouth every 6 (six) hours as needed for headache or moderate pain.    . Lancets Misc. (ACCU-CHEK SOFTCLIX LANCET DEV) KIT Test blood sugar 3 times daily 1 kit 0  . metFORMIN (GLUCOPHAGE) 500 MG tablet Take 0.5 tablets (250 mg total) by mouth daily with breakfast. 30 tablet 3  . QUEtiapine (SEROQUEL) 25 MG tablet  TAKE 1 TABLET(25 MG) BY MOUTH AT BEDTIME 90 tablet 1  . rosuvastatin (CRESTOR) 20 MG tablet Take 1 tablet (20 mg total) by mouth daily. 30 tablet 12  . ticagrelor (BRILINTA) 90 MG TABS tablet Take 1 tablet (90 mg total) by mouth 2 (two) times daily. 60 tablet 1   No current facility-administered medications for this visit.    Allergies as of 04/29/2020  . (No Known Allergies)    Past Medical History:  Diagnosis Date  . Cancer [facility]) 2012   colon cancer  . Closed fracture of left distal radius   . Colon cancer ([place])   . CVA (cerebral vascular accident) ([place]) 05/2019    right MCA territory infarction with right M1 distal occlusion status post stenting.    .  Diabetes ([place])   . GERD (gastroesophageal reflux disease)   . Hypertension     Past Surgical History:  Procedure Laterality Date  . BUBBLE STUDY  06/08/2019   Procedure: BUBBLE STUDY;  Surgeon: Buford Dresser, MD;  Location: [facility] ENDOS[facility];  Service: Cardiovascular;;  . COLON SURGERY  2004   colon cancer  . COLONOSCOPY  2011   Dr. Fuller Plan: Evidence of right hemicolectomy, polyps removed from the rectum, hyperplastic  . IR ANGIO INTRA EXTRACRAN SEL COM CAROTID INNOMINATE BILAT MOD SED  01/08/2020  . IR ANGIO VERTEBRAL SEL SUBCLAVIAN INNOMINATE BILAT MOD SED  01/08/2020  . IR CT HEAD LTD  06/04/2019  . IR CT HEAD LTD  06/04/2019  . IR INTRA CRAN STENT  06/04/2019  . IR PERCUTANEOUS ART THROMBECTOMY/INFUSION INTRACRANIAL INC DIAG ANGIO  06/04/2019  . IR US GUIDE VASC ACCESS RIGHT  06/04/2019  . OPEN REDUCTION INTERNAL FIXATION (ORIF) DISTAL RADIAL FRACTURE Left 09/20/2017   Procedure: OPEN REDUCTION INTERNAL FIXATION (ORIF) DISTAL RADIAL FRACTURE;  Surgeon: Leanora Cover, MD;  Location: [place];  Service: Orthopedics;  Laterality: Left;  . RADIOLOGY WITH ANESTHESIA N/A 06/04/2019   Procedure: IR WITH ANESTHESIA;  Surgeon: Radiologist, Medication, MD;  Location: [place];  Service: Radiology;  Laterality: N/A;  . TEE WITHOUT CARDIOVERSION N/A 06/08/2019   Procedure: TRANSESOPHAGEAL ECHOCARDIOGRAM (TEE);  Surgeon: Buford Dresser, MD;  Location: [facility] ENDOSCOPY;  Service: Cardiovascular;  Laterality: N/A;    Family History  Problem Relation Age of Onset  . Cancer Mother        primary unknown  . Colon cancer Neg Hx     Social History   Socioeconomic History  . Marital status: Divorced    Spouse name: Not on file  . Number of children: Not on file  . Years of education: Not on file  . Highest education level: Not on file  Occupational History  . Not on file  Tobacco Use  . Smoking status: Current Every Day Smoker    Packs/day: 1.00    Start date: 07/20/2019  .  Smokeless tobacco: Never Used  . 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 Vaping Use: Former  Substance and Sexual Activity  . Alcohol use: Yes    Alcohol/week: 24.0 standard drinks    Types: 24 Cans of beer per week    Comment: 12 to 24 beers daily.  As of 04/29/2020 patient reports drinking 3 to 4 days/week.  Between 3-greater than 12 beers at a time.  . Drug use: Never  . Sexual activity: Not on file  Other Topics Concern  . Not on file  Social History Narrative  . Not on  file   Social Determinants of Health   Financial Resource Strain: Not on file  Food Insecurity: Not on file  Transportation Needs: Not on file  Physical Activity: Not on file  Stress: Not on file  Social Connections: Not on file  Intimate Partner Violence: Not on file      ROS:  General: Negative for anorexia, weight loss, fever, chills, fatigue, weakness. Eyes: Negative for vision changes.  ENT: Negative for hoarseness, difficulty swallowing , nasal congestion. CV: Negative for chest pain, angina, palpitations, positive dyspnea on exertion, peripheral edema.  Respiratory: Negative for dyspnea at rest, dyspnea on exertion, cough, sputum, wheezing.  GI: See history of present illness. GU:  Negative for dysuria, hematuria, urinary incontinence, urinary frequency, nocturnal urination.  MS: Negative for joint pain, low back pain.  Derm: Negative for rash or itching.  Neuro: Negative for weakness, abnormal sensation, seizure, frequent headaches, memory loss, confusion.  Psych: Negative for anxiety, depression, suicidal ideation, hallucinations.  Positive mood instability Endo: Negative for unusual weight change.  Heme: Negative for bruising or bleeding. Allergy: Negative for rash or hives.    Physical Examination:  BP (!) 148/86   Pulse 75   Temp (!) 96.6 F (35.9 C) (Temporal)   Ht 5' 10"  (1.778 m)   Wt 235 lb 6.4 oz (106.8 kg)   BMI 33.78 kg/m    General:  Well-nourished, well-developed in no acute distress.  Head: Normocephalic, atraumatic.   Eyes: Conjunctiva pink, no icterus. Mouth: masked Neck: Supple without thyromegaly, masses, or lymphadenopathy.  Lungs: Clear to auscultation bilaterally.  Heart: Regular rate and rhythm, no murmurs rubs or gallops.  Abdomen: Bowel sounds are normal, nondistended, no hepatosplenomegaly or masses, no abdominal bruits or    hernia , no rebound or guarding.  Mild to moderate epigastric tenderness.  Positive diastases recti. Rectal: not performed Extremities: No lower extremity edema. No clubbing or deformities.  Neuro: Alert and oriented x 4 , grossly normal neurologically.  Skin: Warm and dry, no rash or jaundice.   Psych: Alert and cooperative, normal mood and affect.  Labs: Lab Results  Component Value Date   HGBA1C 5.7 (H) 01/18/2020   Lab Results  Component Value Date   CREATININE 0.85 01/18/2020   BUN 11 01/18/2020   NA 139 01/18/2020   K 3.9 01/18/2020   CL 103 01/18/2020   CO2 22 01/18/2020   Lab Results  Component Value Date   ALT 34 01/18/2020   AST 24 01/18/2020   ALKPHOS 86 01/18/2020   BILITOT 0.2 01/18/2020   Lab Results  Component Value Date   WBC 7.2 01/08/2020   HGB 15.6 01/08/2020   HCT 45.8 01/08/2020   MCV 94.8 01/08/2020   PLT 198 01/08/2020   Lab Results  Component Value Date   VITAMINB12 428 11/06/2019   Lab Results  Component Value Date   TSH 1.220 01/18/2020     Imaging Studies: CUP PACEART REMOTE DEVICE CHECK  Result Date: 04/07/2020 ILR summary report received. Battery status OK. Normal device function. No new symptom, tachy, brady, or pause episodes. No new AF episodes. Monthly summary reports and ROV/PRN. HB  Assessment/plan:  Pleasant 60 year old male with history of recently diagnosed diabetes, colon cancer (2004), hypertension, stroke in March 2021 (right MCA territory infarction with right M1 distal occlusion status post stenting), history  of excessive alcohol abuse, smoker, recent diagnosis of sleep apnea who presents to schedule surveillance colonoscopy.  Last surveillance exam in 2011.  Surveillance colonoscopy: Overdue at this  time.  Patient also complains of 2-year history of intermittent diarrhea, rarely has a solid stool.  Occasional bright red blood per rectum which she feels is hemorrhoid related.  Symptoms worse related to certain foods or excessive alcohol use.  Etiology unclear, doubt infectious.  Medication related, celiac disease, IBD, microscopic colitis, malignancy remains in the differential.  Plan on colonoscopy in near future.   UGI symptoms: Patient complains of frequent heartburn, nausea, epigastric pain.  Has a lot of nighttime symptoms.  Denies dysphagia.  Differential diagnosis includes GERD, gastritis, peptic ulcer disease.  Encourage patient to cut back on alcohol use.  1. Add pantoprazole 40 mg daily before breakfast. 2. EGD and colonoscopy with Dr. Gala Romney with propofol, ASA III.  I have discussed the risks, alternatives, benefits with regards to but not limited to the risk of reaction to medication, bleeding, infection, perforation and the patient is agreeable to proceed. Written consent to be obtained.  We will continue Brilinta and aspirin. Patient aware that there is very slight chance that if large polyp encountered, that he would need repeat colonoscopy off Brilinta. Benefits of continuing Brilinta given CVA/stent outweigh the risks. 3. Encourage patient to cut back on alcohol consumption.

## 2020-04-29 NOTE — Telephone Encounter (Signed)
Call placed to [facility] regarding results of sleep study.  Spoke to Palmer who said that the results are not available yet.

## 2020-04-29 NOTE — Progress Notes (Signed)
Subjective:  Patient ID: Scott Day, male    DOB: Jun 02, 1960  Age: 60 y.o. 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 His male friend had sent me a message in my chart stating the patient sleep study had revealed severe sleep apnea and he was tested with a BiPAP and she had questions about when referral to home health will be made.  I had reviewed his chart and do not see his sleep study report and it states active-in progress in his chart.  I had the case manager call over to the sleep center and confirmed the results are not yet available have discussed this with the patient and his friend.  His weakness has gotten better.  Sees Dr. Posey Pronto of rehab medicine.  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 He has visual problems which got worse with the stroke; he also is hearing impaired and would like to see both an ophthalmologist and be evaluated for his hearing loss.  He is followed by interventional radiology, Dr Estanislado Pandy who has him on Brilinta. They will call for an appt for 06/2019 to follow-up with interventional. Smokes 1 pack/day and is thinking of quitting smoking but every time he tries he finds himself smoking more. Compliant with his diabetic medications and denies hypoglycemic episodes, numbness in extremities. His blood pressure is elevated and his friend states blood pressures at home have been in the 638L systolic.  Review of his chart reveals over the last 3 visits his blood pressure has been consistently above systolic of 373. Past Medical History:  Diagnosis Date  . Cancer [facility] 2012   colon cancer  . Closed fracture of left distal radius   . Colon cancer  ([place])   . CVA (cerebral vascular accident) ([place]) 05/2019    right MCA territory infarction with right M1 distal occlusion status post stenting.    . Diabetes ([place])   . GERD (gastroesophageal reflux disease)   . Hypertension     Past Surgical History:  Procedure Laterality Date  . BUBBLE STUDY  06/08/2019   Procedure: BUBBLE STUDY;  Surgeon: Buford Dresser, MD;  Location: [facility] ENDOSCOPY;  Service: Cardiovascular;;  . COLON SURGERY  2004   colon cancer  . COLONOSCOPY  2011   Dr. Fuller Plan: Evidence of right hemicolectomy, polyps removed from the rectum, hyperplastic  . IR ANGIO INTRA EXTRACRAN SEL COM CAROTID INNOMINATE BILAT MOD SED  01/08/2020  . IR ANGIO VERTEBRAL SEL SUBCLAVIAN INNOMINATE BILAT MOD SED  01/08/2020  . IR CT HEAD LTD  06/04/2019  . IR CT HEAD LTD  06/04/2019  . IR INTRA CRAN STENT  06/04/2019  . IR PERCUTANEOUS ART THROMBECTOMY/INFUSION INTRACRANIAL INC DIAG ANGIO  06/04/2019  . IR US GUIDE VASC ACCESS RIGHT  06/04/2019  . OPEN REDUCTION INTERNAL FIXATION (ORIF) DISTAL RADIAL FRACTURE Left 09/20/2017   Procedure: OPEN REDUCTION INTERNAL FIXATION (ORIF) DISTAL RADIAL FRACTURE;  Surgeon: Leanora Cover, MD;  Location: [facility];  Service: Orthopedics;  Laterality: Left;  . RADIOLOGY WITH ANESTHESIA N/A 06/04/2019   Procedure: IR WITH ANESTHESIA;  Surgeon: Radiologist, Medication, MD;  Location: [place];  Service: Radiology;  Laterality: N/A;  . TEE WITHOUT  CARDIOVERSION N/A 06/08/2019   Procedure: TRANSESOPHAGEAL ECHOCARDIOGRAM (TEE);  Surgeon: Buford Dresser, MD;  Location: [facility] ENDOSCOPY;  Service: Cardiovascular;  Laterality: N/A;    Family History  Problem Relation Age of Onset  . Cancer Mother        primary unknown  . Colon cancer Neg Hx     No Known Allergies  Outpatient Medications Prior to Visit  Medication Sig Dispense Refill  . Accu-Chek Softclix Lancets lancets Use as instructed 100 each 12  . acetaminophen (TYLENOL) 500 MG tablet Take  1,000 mg by mouth every 6 (six) hours as needed for moderate pain or headache.    Marland Kitchen aspirin 81 MG chewable tablet Chew 1 tablet (81 mg total) by mouth daily.    . Blood Glucose Monitoring Suppl (ACCU-CHEK AVIVA CONNECT) w/Device KIT Test blood sugar 3 times daily 1 kit 0  . divalproex (DEPAKOTE ER) 250 MG 24 hr tablet Take 1 tablet (250 mg total) by mouth daily. 30 tablet 1  . FLUoxetine (PROZAC) 20 MG capsule Take 1 capsule (20 mg total) by mouth daily. 90 capsule 3  . glucose blood (ACCU-CHEK AVIVA PLUS) test strip Use as instructed 100 each 12  . ibuprofen (ADVIL) 200 MG tablet Take 400 mg by mouth every 6 (six) hours as needed for headache or moderate pain.    . Lancets Misc. (ACCU-CHEK SOFTCLIX LANCET DEV) KIT Test blood sugar 3 times daily 1 kit 0  . pantoprazole (PROTONIX) 40 MG tablet Take 1 tablet (40 mg total) by mouth daily before breakfast. 90 tablet 1  . QUEtiapine (SEROQUEL) 25 MG tablet TAKE 1 TABLET(25 MG) BY MOUTH AT BEDTIME 90 tablet 1  . rosuvastatin (CRESTOR) 20 MG tablet Take 1 tablet (20 mg total) by mouth daily. 30 tablet 12  . ticagrelor (BRILINTA) 90 MG TABS tablet Take 1 tablet (90 mg total) by mouth 2 (two) times daily. 60 tablet 1  . metFORMIN (GLUCOPHAGE) 500 MG tablet Take 0.5 tablets (250 mg total) by mouth daily with breakfast. 30 tablet 3   No facility-administered medications prior to visit.     ROS Review of Systems  Constitutional: Negative for activity change and appetite change.  HENT: Positive for hearing loss. Negative for sinus pressure and sore throat.   Eyes: Positive for visual disturbance.  Respiratory: Negative for cough, chest tightness and shortness of breath.   Cardiovascular: Negative for chest pain and leg swelling.  Gastrointestinal: Negative for abdominal distention, abdominal pain, constipation and diarrhea.  Endocrine: Negative.   Genitourinary: Negative for dysuria.  Musculoskeletal: Negative for joint swelling and myalgias.  Skin:  Negative for rash.  Allergic/Immunologic: Negative.   Neurological: Negative for weakness, light-headedness and numbness.  Psychiatric/Behavioral: Negative for dysphoric mood and suicidal ideas.    Objective:  BP (!) 151/85   Pulse 76   Ht 5' 10"  (1.778 m)   Wt 236 lb 6.4 oz (107.2 kg)   SpO2 96%   BMI 33.92 kg/m   BP/Weight 04/29/2020 04/29/2020 8/84/1660  Systolic BP 630 160 109  Diastolic BP 85 86 88  Wt. (Lbs) 236.4 235.4 237  BMI 33.92 33.78 34.01      Physical Exam Constitutional:      Appearance: He is well-developed.  HENT:     Right Ear: Tympanic membrane normal.     Left Ear: Tympanic membrane normal.  Neck:     Vascular: No JVD.  Cardiovascular:     Rate and Rhythm: Normal rate.     Heart sounds: Normal  heart sounds. No murmur heard.   Pulmonary:     Effort: Pulmonary effort is normal.     Breath sounds: Normal breath sounds. No wheezing or rales.  Chest:     Chest wall: No tenderness.  Abdominal:     General: Bowel sounds are normal. There is no distension.     Palpations: Abdomen is soft. There is no mass.     Tenderness: There is no abdominal tenderness.  Musculoskeletal:        General: Normal range of motion.     Right lower leg: No edema.     Left lower leg: No edema.  Neurological:     Mental Status: He is alert and oriented to person, place, and time.  Psychiatric:        Mood and Affect: Mood normal.     CMP Latest Ref Rng & Units 01/18/2020 01/08/2020 09/10/2019  Glucose 65 - 99 mg/dL 125(H) 134(H) 106(H)  BUN 6 - 24 mg/dL 11 10 11   Creatinine 0.76 - 1.27 mg/dL 0.85 0.87 1.00  Sodium 134 - 144 mmol/L 139 139 140  Potassium 3.5 - 5.2 mmol/L 3.9 3.9 4.4  Chloride 96 - 106 mmol/L 103 103 103  CO2 20 - 29 mmol/L 22 24 24   Calcium 8.7 - 10.2 mg/dL 9.5 9.6 9.5  Total Protein 6.0 - 8.5 g/dL 7.1 - 6.9  Total Bilirubin 0.0 - 1.2 mg/dL 0.2 - 0.5  Alkaline Phos 44 - 121 IU/L 86 - 87  AST 0 - 40 IU/L 24 - 20  ALT 0 - 44 IU/L 34 - 26     Lipid Panel     Component Value Date/Time   CHOL 140 09/10/2019 1045   TRIG 177 (H) 09/10/2019 1045   HDL 36 (L) 09/10/2019 1045   CHOLHDL 3.9 09/10/2019 1045   CHOLHDL 6.4 06/05/2019 0543   VLDL 26 06/05/2019 0543   LDLCALC 74 09/10/2019 1045    CBC    Component Value Date/Time   WBC 7.2 01/08/2020 0715   RBC 4.83 01/08/2020 0715   HGB 15.6 01/08/2020 0715   HGB 16.3 12/26/2006 1008   HCT 45.8 01/08/2020 0715   HCT 45.1 12/26/2006 1008   PLT 198 01/08/2020 0715   PLT 184 12/26/2006 1008   MCV 94.8 01/08/2020 0715   MCV 94.2 12/26/2006 1008   MCH 32.3 01/08/2020 0715   MCHC 34.1 01/08/2020 0715   RDW 12.0 01/08/2020 0715   RDW 12.7 12/26/2006 1008   LYMPHSABS 1.4 06/11/2019 0543   LYMPHSABS 2.1 12/26/2006 1008   MONOABS 1.0 06/11/2019 0543   MONOABS 0.5 12/26/2006 1008   EOSABS 0.3 06/11/2019 0543   EOSABS 0.2 12/26/2006 1008   BASOSABS 0.1 06/11/2019 0543   BASOSABS 0.1 12/26/2006 1008    Lab Results  Component Value Date   HGBA1C 6.3 04/29/2020    Assessment & Plan:  1. Diabetes mellitus, new onset ([place]) Controlled with A1c of 6.3 Continue current management Counseled on Diabetic diet, my plate method, 811 minutes of moderate intensity exercise/week Blood sugar logs with fasting goals of 80-120 mg/dl, random of less than 180 and in the event of sugars less than 60 mg/dl or greater than 400 mg/dl encouraged to notify the clinic. Advised on the need for annual eye exams, annual foot exams, Pneumonia vaccine. - POCT glycosylated hemoglobin (Hb A1C) - POCT glucose (manual entry) - Ambulatory referral to Ophthalmology - metFORMIN (GLUCOPHAGE) 500 MG tablet; Take 0.5 tablets (250 mg total) by mouth daily with  breakfast.  Dispense: 30 tablet; Refill: 6 - CMP14+EGFR; Future - Lipid panel; Future - Microalbumin / creatinine urine ratio; Future  2. Cerebrovascular accident (CVA) due to thrombosis of right middle cerebral artery (HCC) Weakness has  improved Risk factor modification including statin therapy  3. Other specified hearing loss of both ears - Ambulatory referral to Audiology  4. Vision abnormalities - Ambulatory referral to Ophthalmology  5. Hypertension complicating diabetes (HCC) Low-dose lisinopril initiated Counseled on blood pressure goal of less than 130/80, low-sodium, DASH diet, medication compliance, 150 minutes of moderate intensity exercise per week. Discussed medication compliance, adverse effects.  6. Need for hepatitis C screening test - HCV RNA quant rflx ultra or genotyp([facility]); Future  7. Tobacco abuse Spent 3 minutes counseling on smoking cessation and he is willing to work on quitting Bupropion initiated  Health Care Maintenance: Currently seeing GI and will be scheduled for colonoscopy Meds ordered this encounter  Medications  . metFORMIN (GLUCOPHAGE) 500 MG tablet    Sig: Take 0.5 tablets (250 mg total) by mouth daily with breakfast.    Dispense:  30 tablet    Refill:  6  . buPROPion (WELLBUTRIN XL) 150 MG 24 hr tablet    Sig: Take 1 tablet (150 mg total) by mouth daily.    Dispense:  30 tablet    Refill:  6    Follow-up: Return in about 3 months (around 07/27/2020) for Chronic disease management.       Charlott Rakes, MD, FAAFP. [facility] and [place], [place]   04/29/2020, 1:28 PM

## 2020-04-29 NOTE — Patient Instructions (Addendum)
1. Colonoscopy and upper endoscopy with Dr. Gala Romney.  Please see separate instructions. 2. Please start pantoprazole 40 mg daily before breakfast to see if this helps prevent some of your reflux symptoms, nausea, abdominal pain. 3. Please try to cut back on alcohol consumption. No more than three 12 ounce beers in a 24 hour period of time.

## 2020-04-30 ENCOUNTER — Encounter: Payer: Self-pay | Admitting: Gastroenterology

## 2020-05-01 ENCOUNTER — Telehealth: Payer: Self-pay

## 2020-05-01 MED ORDER — MISC. DEVICES MISC: 0 refills | Status: DC

## 2020-05-01 NOTE — Telephone Encounter (Signed)
Thanks for the update. Can you please inform the patient that his sleep study revealed presence of severe obstructive sleep apnea.  I have printed out a prescription for his CPAP machine. Thank you.

## 2020-05-01 NOTE — Telephone Encounter (Signed)
Call received from [facility], [place] stating that the results from the sleep study are in patient's medical record

## 2020-05-02 ENCOUNTER — Other Ambulatory Visit: Payer: Self-pay | Admitting: Family Medicine

## 2020-05-02 MED ORDER — MISC. DEVICES MISC
0 refills | Status: DC
Start: 2020-05-02 — End: 2020-05-05

## 2020-05-02 NOTE — Progress Notes (Signed)
NEUROPSYCHOLOGY VISIT  Therapy Progress Note  04/29/2020 CIEL YANES  MRN:  [id number] Principal Problem: Aggression and difficulty controlling behavior after stroke.   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   Total Time spent with patient: 1 hour  Subjective:    Patient ID: Scott Day is a 60 y.o. male.  Chief Complaint: HPI: Reynold Mantell is a right-handed, caucasian, male, with history of colon cancer 2012, GERD, and tobacco abuse presents for health behavioral intervention to improve treatment compliance, reduce smoking tobacco, promote functional improvement, as well as minimize psychological and psychosocial barriers to rehabilitation and recovery post stroke (i.e.., right MCA territory infarction with right M1 distal occlusion status post stenting).  Mr. Yo continues to have trouble managing emotions (i.e., irritability, agitation, anger) and behavior (e.g., verbal and some physical outbursts; no physical violence) after stroke on 06/04/19. Mood and behavioral symptoms predate stroke to some extent but become more pronounced after. Aggression and verbal/behavioral outbursts typically occur in response to stressors and various triggers. Symptoms are currently rated moderate. Associated signs and symptoms include: anger, excessively aggressive, out of control, poor social interaction, and stubbornness: Additional concerns include: Financial difficulties Health problems Legal issue Occupational concerns Other: Relational conflict.   He continues to smoke around 1 pack of cigarettes daily. Clinical course is mostly stable with some waxing and waning.   He had a recent follow-up appointment with Dr. Posey Pronto on 04/14/20. The following was copied from that visit: "  He was last seen in clinic on 02/26/20.  Since that time, patient states he is doing HEP. BP is  controlled. He is taking Depakote and Seroquel inconsistently.  Continues to smoke 1 PPD. He has not been able to follow up with CBT. He is attempting to schedule sleep study. Attempting to apply for SSD".   Current visit: Patient accompanied by his partner. Patient described having another difficult conversation about his continued tobacco abuse during last meeting with Dr. Posey Pronto (04/15/19). Patient states that he is interested in using nicotine oral inhaler to help quit.   He is working on managing anger and trying to avoid outbursts. He continues to yell and curse at his partner most days but also taking proactive steps (e.g., recognizing triggers and leaving volatile situations to prevent physical aggression. His partner corroborates this.   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 He worries about paying bills and taking care of himself financially. Expresses Interest in applying for SSID.   The following portions of the patient's history were reviewed and updated as appropriate: allergies, current medications, past family history, past medical history, past social history, past surgical history and problem list. Review of Systems  Objective:  Physical Exam Psychiatric:        Attention and Perception: He is inattentive. He does not perceive auditory or visual hallucinations.        Mood and Affect: Mood is not anxious, depressed or elated. Affect is blunt and angry (when triggered). Affect is not labile, flat, tearful or inappropriate.        Speech: He is communicative. Speech is tangential. Speech is not rapid and pressured, delayed or slurred.        Behavior: Behavior is agitated and aggressive. Behavior is not slowed, withdrawn, hyperactive or combative. Behavior is cooperative.        Thought  Content: Thought content is paranoid (mild). Thought content is not delusional.  Thought content does not include homicidal or suicidal ideation. Thought content does not include homicidal or suicidal plan.        Cognition and Memory: Cognition is impaired (mild). Memory is impaired (mild). He exhibits impaired recent memory (mild). He does not exhibit impaired remote memory.        Judgment: Judgment is impulsive and inappropriate.   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 It is felt more time is needed for Interventions to work  Treatment Goals:  Increase social support, Increase ability to appropriately verbalize feelings, Increase emotional regulation, Facilitate patient progression through stages of change regarding substance use diagnoses and concerns, Identify triggers associated with mental health/substance abuse issues and Increase skills for wellness and recovery   1. Reduce verbal outbursts to once per day  Met:  No as of 04/29/20.  Target date: 06/24/20 (revised)  As evidenced by: Partner's report. Partner has been reminded and encouraged to record outbursts week to week to objectively monitor aggressive behavior  2. Reduce alcohol use 50%     Met:  Yes as of 04/29/20. No longer drinking daily but still more than 12 per week.   Revised Goal: Consume less than 12 beers per week.   Target date: 06/24/20  As evidenced by: Self report;  less than 12-pack of beer per week   3. Reduce tobacco use 25%     Met:  No   New Target date:06/24/20 (revised)  As evidenced by: Self-report; Will report smoking 1 pack or less per day for 1 full week.    Plan:   We have scheduled patient for another 4 biweekly therapy  appointments to continue health behavioral intervention(s) targeting anger and behavioral management, smoking cessation, and functional improvement.    Billing/Service Summary:  [phone] (Health behavior intervention, individual, face-to-face; initial 30 minutes) x1 [phone] (Health behavior intervention, individual, face-to-face; each additional 15 minutes) x 2  to

## 2020-05-05 ENCOUNTER — Other Ambulatory Visit: Payer: Self-pay

## 2020-05-05 MED ORDER — MISC. DEVICES MISC: 0 refills | Status: AC

## 2020-05-05 NOTE — Telephone Encounter (Signed)
Order has been faxed to [facility].

## 2020-05-06 ENCOUNTER — Encounter: Payer: Medicaid Other | Admitting: Physical Medicine & Rehabilitation

## 2020-05-06 ENCOUNTER — Other Ambulatory Visit: Payer: Self-pay

## 2020-05-06 ENCOUNTER — Ambulatory Visit: Payer: Medicaid Other | Admitting: Adult Health

## 2020-05-06 ENCOUNTER — Ambulatory Visit: Payer: Medicaid Other | Attending: Family Medicine

## 2020-05-06 ENCOUNTER — Encounter: Payer: Self-pay | Admitting: Adult Health

## 2020-05-06 ENCOUNTER — Encounter: Payer: Self-pay | Admitting: Physical Medicine & Rehabilitation

## 2020-05-06 VITALS — BP 157/90 | HR 79 | Ht 69.0 in | Wt 243.0 lb

## 2020-05-06 VITALS — BP 155/88 | HR 76 | Temp 98.6°F | Ht 70.0 in | Wt 242.6 lb

## 2020-05-06 DIAGNOSIS — Z1159 Encounter for screening for other viral diseases: Secondary | ICD-10-CM | POA: Diagnosis not present

## 2020-05-06 DIAGNOSIS — F0630 Mood disorder due to known physiological condition, unspecified: Secondary | ICD-10-CM | POA: Diagnosis not present

## 2020-05-06 DIAGNOSIS — I69319 Unspecified symptoms and signs involving cognitive functions following cerebral infarction: Secondary | ICD-10-CM | POA: Diagnosis not present

## 2020-05-06 DIAGNOSIS — E119 Type 2 diabetes mellitus without complications: Secondary | ICD-10-CM | POA: Diagnosis not present

## 2020-05-06 DIAGNOSIS — I63511 Cerebral infarction due to unspecified occlusion or stenosis of right middle cerebral artery: Secondary | ICD-10-CM | POA: Diagnosis not present

## 2020-05-06 DIAGNOSIS — I69398 Other sequelae of cerebral infarction: Secondary | ICD-10-CM | POA: Diagnosis not present

## 2020-05-06 DIAGNOSIS — Z720 Tobacco use: Secondary | ICD-10-CM

## 2020-05-06 DIAGNOSIS — F0391 Unspecified dementia with behavioral disturbance: Secondary | ICD-10-CM | POA: Diagnosis not present

## 2020-05-06 DIAGNOSIS — F17200 Nicotine dependence, unspecified, uncomplicated: Secondary | ICD-10-CM

## 2020-05-06 DIAGNOSIS — I639 Cerebral infarction, unspecified: Secondary | ICD-10-CM

## 2020-05-06 MED ORDER — QUETIAPINE FUMARATE 25 MG PO TABS: 25.0000 mg | ORAL_TABLET | Freq: Every evening | ORAL | 3 refills | Status: AC | PRN

## 2020-05-06 NOTE — Progress Notes (Signed)
I agree with the above plan 

## 2020-05-06 NOTE — Progress Notes (Signed)
[facility] [address] [place]. [place] [address] [phone]       STROKE FOLLOW UP NOTE  [name] Date of Birth:  01-16-1961 Medical Record Number:  [id number]   Reason for Referral: stroke follow up    SUBJECTIVE:   CHIEF COMPLAINT:  Chief Complaint  Patient presents with  . Follow-up    RM 14 with girlfriend (Scott Day) Continued cognitive and mood issues    HPI:   Today, 05/06/2020, Scott Day returns for 82-monthstroke follow-up accompanied by his gf Scott Day.  Reports residual cognitive impairment with behaviors and mood instability Completed neurocognitive eval 10/2019, OV note personally reviewed which showed weakness in processing speed and impairment and some executive functions, behaviorally showed signs of executive dysfunction including impulsivity and preservation with limited insight into his weakness exacerbated by substance abuse (EtOH use) He continues to follow with Dr. ZDarol Destineroutinely for CBT Mood stabilization: Bupropion 150 mg daily, Depakote ER 250 mg daily, fluoxetine 20 mg daily and Seroquel 25 mg nightly SO reports recent worsening of mood since starting bupropion last week which was started by PCP for smoking cessation.  She is questioning ongoing need and indication of seroquel as well as possible dosage increase on fluoxetine Currently in the process of applying for SSD Denies new or worsening stroke/TIA symptoms Continued tobacco use as well as EtOH use with daily amt fluctuating (depending on what he is doing or who he is around)  Reports compliance with aspirin and Crestor Reports compliance with Brilinta s/p stent per Dr. DEstanislado Pandy- has f/u imaging in April Blood pressure today 157/90 Recent diagnosis of severe OSA -currently awaiting CPAP machine  No further concerns at this time    History provided for reference purposes only Update 11/06/2019 JM: Scott Day being seen for stroke follow-up accompanied by his  SO, Scott Day.  Residual deficits of cognitive impairment with behaviors, left-sided weakness, occasional swallowing difficulty, dysarthria and mild headaches Greatest concern today of SO is in regards to neurocognitive disorder with behaviors consisting of frequent outbursts, agitation, irritation, delusions and lack of empathy.  Also reports increased alcohol intake of approximately 12 beers per day. SO reported increased fatigue and depression after initiating sertraline therefore discontinued and switched to fluoxetine 20 mg daily on 8/4.  She is unable to appreciate benefit thus far. Continues Seroquel 25 mg nightly tolerating well and assist with sleeping and prior insomnia.  Completed first half of neurocognitive eval yesterday and plans on completing on Monday. Experiences headache every other day and temples bilaterally typically towards the afternoon.  At times can be debilitating where he will lay down and headache will subside after approximately 1 hour.  Denies photophobia, phonophobia or N/V.  Denies new or worsening stroke/TIA symptoms Continues aspirin 81 mg daily and Brilinta without bleeding or bruising Plans on cerebral angiogram with Dr. DEstanislado Pandynext month for surveillance monitoring post stent Continues on Crestor 20 mg daily without myalgias Blood pressure today 141/84 Glucose levels stable with recent A1c 6.3 No further concerns  Initial visit 07/23/2019 JM: Scott Day being seen for hospital follow-up accompanied by his daughter.  Residual deficits mild dysphagia, visual impairment, cognitive impairment, and left sided neglect.  Daughter also endorses mood changes such as increased agitation, lack of patience and short temper.  No prior history of depression or anxiety.  Due to lack of insurance, outpatient therapies currently on hold but continues to do HEP with ongoing improvement.   Follow-up with cardiology with plans  on pursuing loop recorder placement once insurance is  confirmed as currently Medicaid pending.  Continues on aspirin 325 mg daily and Brilinta 90 mg twice daily without bleeding or bruising.  Scheduled follow-up with IR 09/04/2019 for surveillance monitoring of right M1 stent.  Self discontinued atorvastatin due to myalgias with resolution after discontinuing.  Blood pressure today 140/70.  Has scheduled appointment with community health and wellness in the near future to establish care.  Hypercoagulable labs showed slightly elevated homocysteine level and positive ANA with negative lupus anticoagulant panel, beta-2 glycoprotein and cardiolipin antibodies.  No further concerns at this time.  Stroke admission 06/02/2019: Scott E Barleyis a 60 y.o.malewith history of colon cancer in 2012 presented to [facility] on 06/04/2019 with L sided weakness.  CTA w/ R M1 occlusion w/ penumbra. Transferred to [facility] for IR.  Evaluated by stroke team with stroke work-up revealing right MCA territory infarct with right M1 distal occlusion s/p IR with right M1 stent placement, embolic secondary to unknown source.  Initially, IR mechanical thrombectomy with TICI 2C revascularization and dissection flap at site of occlusion with cerebral angio with reocclusion therefore right M1 stent placed with TICI 3 flow and advised to follow-up with IR outpatient.  Recommended consideration of loop recorder placement outpatient due to lack of insurance.  Also recommended pursuing hypercoagulable labs outpatient.  Discharged on aspirin 325 mg daily and Brilinta 90 mg twice daily for secondary stroke prevention and stent placement.  No history of HTN.  LDL 126 initiate atorvastatin 80 mg daily.  New diagnosis of DM with A1c 8.3 and recommended PCP follow-up outpatient.  Other stroke risk factors include EtOH use and obesity but no prior history of stroke.  Evaluated by therapies with residual deficits of mild dysarthria, right gaze preference, left  hemianopsia versus neglect, left facial droop and dysphagia and recommended discharge to CIR for ongoing therapy needs.  Stroke: R MCA territory infarct with right M1 distal occlusion s/p IR with R M1 stent placement, embolic secondary to unknown source  Code Stroke CT head No acute abnormality. ASPECTS 10.   CTA head &neck nonocclusive distal R M1 thrombus into bifurcation. Neck ok  CT perfusion 43m core, 1256mterritory at risk  IR mid/distal R M1 occlusion.Mechanical thrombectomy w/ TICI2c revascularization. Dissection flap at site of occlusion. Cerebral angio w/ reocclusion. R M1 stent placed w/ TICI3 flow. Treated w/ Integrilin gtt.   Post IR CT no hemorrhage. pathcy cortical/subcortical R MCA infarct (frontal operculum, insula, temporoparietal jxn). Sinus dz.   Not able to have MRI due to left ankle brace  CT head repeat 3/16 no ICH  LE Dopplerno DVT  2D EchoEF 65-70%. No source of embolus   TEE completed, no PFO, EF 65-70%  Loop will be considered as outpt as pt is uninsured at this time.   Hypercoagulable labs: out pt f/u  UDS neg  LDL126  HgbA1c8.3  SCDs for VTE prophylaxis  No antithromboticprior to admission, now on aspirin 325 mg daily and Brilinta (ticagrelor) 90 mg bidfollowing Brilinta load. Continue on discharge  Therapy recommendations: CIR       ROS:   14 system review of systems performed and negative with exception of see HPI  PMH:  Past Medical History:  [facility]osis Date  [facility]   [facility]  . Closed fracture of left distal radius   . [place]  . [facility] [place]  right MCA territory infarction with right M1 distal occlusion status post stenting.    . Diabetes ([place])   . GERD (gastroesophageal reflux disease)   . Hypertension     PSH:  Past Surgical History:  Procedure Laterality Date  . BUBBLE STUDY  06/08/2019   Procedure: BUBBLE STUDY;  Surgeon: Buford Dresser, MD;  Location: [facility] ENDOSCOPY;  Service: Cardiovascular;;  . COLON SURGERY  2004   colon cancer  . COLONOSCOPY  2011   Dr. Fuller Plan: Evidence of right hemicolectomy, polyps removed from the rectum, hyperplastic  . IR ANGIO INTRA EXTRACRAN SEL COM CAROTID INNOMINATE BILAT MOD SED  01/08/2020  . IR ANGIO VERTEBRAL SEL SUBCLAVIAN INNOMINATE BILAT MOD SED  01/08/2020  . IR CT HEAD LTD  06/04/2019  . IR CT HEAD LTD  06/04/2019  . IR INTRA CRAN STENT  06/04/2019  . IR PERCUTANEOUS ART THROMBECTOMY/INFUSION INTRACRANIAL INC DIAG ANGIO  06/04/2019  . IR US GUIDE VASC ACCESS RIGHT  06/04/2019  . OPEN REDUCTION INTERNAL FIXATION (ORIF) DISTAL RADIAL FRACTURE Left 09/20/2017   Procedure: OPEN REDUCTION INTERNAL FIXATION (ORIF) DISTAL RADIAL FRACTURE;  Surgeon: Leanora Cover, MD;  Location: [place];  Service: Orthopedics;  Laterality: Left;  . RADIOLOGY WITH ANESTHESIA N/A 06/04/2019   Procedure: IR WITH ANESTHESIA;  Surgeon: Radiologist, Medication, MD;  Location: [place];  Service: Radiology;  Laterality: N/A;  . TEE WITHOUT CARDIOVERSION N/A 06/08/2019   Procedure: TRANSESOPHAGEAL ECHOCARDIOGRAM (TEE);  Surgeon: Buford Dresser, MD;  Location: [facility] ENDOSCOPY;  Service: Cardiovascular;  Laterality: N/A;    Social History:  Social History   Socioeconomic History  . Marital status: Divorced    Spouse name: Not on file  . Number of children: Not on file  . Years of education: Not on file  . Highest education level: Not on file  Occupational History  . Not on file  Tobacco Use  . Smoking status: Current Every Day Smoker    Packs/day: 1.00    Start date: 07/20/2019  . Smokeless tobacco: Never Used  . 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 Vaping Use: Former  Substance and Sexual Activity  . Alcohol use: Yes    Alcohol/week: 24.0 standard drinks    Types: 24 Cans of beer per week    Comment: 12 to 24 beers daily.  As of 04/29/2020 patient  reports drinking 3 to 4 days/week.  Between 3-greater than 12 beers at a time.  . Drug use: Never  . Sexual activity: Not on file  Other Topics Concern  . Not on file  Social History Narrative  . Not on file   Social Determinants of Health   Financial Resource Strain: Not on file  Food Insecurity: Not on file  Transportation Needs: Not on file  Physical Activity: Not on file  Stress: Not on file  Social Connections: Not on file  Intimate Partner Violence: Not on file    Family History:  Family History  Problem Relation Age of Onset  . Cancer Mother        primary unknown  . Colon cancer Neg Hx     Medications:   Current Outpatient Medications on File Prior to Visit  Medication Sig Dispense Refill  . Accu-Chek Softclix Lancets lancets Use as instructed 100 each 12  . acetaminophen (TYLENOL) 500 MG tablet Take 1,000 mg by mouth every 6 (six) hours as needed for moderate pain or headache.    Marland Kitchen aspirin  81 MG chewable tablet Chew 1 tablet (81 mg total) by mouth daily.    . Blood Glucose Monitoring Suppl (ACCU-CHEK AVIVA CONNECT) w/Device KIT Test blood sugar 3 times daily 1 kit 0  . buPROPion (WELLBUTRIN XL) 150 MG 24 hr tablet Take 1 tablet (150 mg total) by mouth daily. 30 tablet 6  . divalproex (DEPAKOTE ER) 250 MG 24 hr tablet Take 1 tablet (250 mg total) by mouth daily. 30 tablet 1  . FLUoxetine (PROZAC) 20 MG capsule Take 1 capsule (20 mg total) by mouth daily. 90 capsule 3  . glucose blood (ACCU-CHEK AVIVA PLUS) test strip Use as instructed 100 each 12  . ibuprofen (ADVIL) 200 MG tablet Take 400 mg by mouth every 6 (six) hours as needed for headache or moderate pain.    . Lancets Misc. (ACCU-CHEK SOFTCLIX LANCET DEV) KIT Test blood sugar 3 times daily 1 kit 0  . lisinopril (ZESTRIL) 2.5 MG tablet Take 1 tablet (2.5 mg total) by mouth daily. 90 tablet 1  . metFORMIN (GLUCOPHAGE) 500 MG tablet Take 0.5 tablets (250 mg total) by mouth daily with breakfast. 30 tablet 6  .  Misc. Devices MISC CPAP with AutoPap 8-15.  Diagnosis obstructive sleep apnea. 1 each 0  . pantoprazole (PROTONIX) 40 MG tablet Take 1 tablet (40 mg total) by mouth daily before breakfast. 90 tablet 1  . rosuvastatin (CRESTOR) 20 MG tablet Take 1 tablet (20 mg total) by mouth daily. 30 tablet 12  . ticagrelor (BRILINTA) 90 MG TABS tablet Take 1 tablet (90 mg total) by mouth 2 (two) times daily. 60 tablet 1   No current facility-administered medications on file prior to visit.    Allergies:  No Known Allergies    OBJECTIVE:  Physical Exam  Vitals:   05/06/20 1132  BP: (!) 157/90  Pulse: 79  Weight: 243 lb (110.2 kg)  Height: 5' 9"  (1.753 m)   Body mass index is 35.88 kg/m. No exam data present  General: well developed, well nourished,  pleasant middle-age Caucasian male, seated, in no evident distress Head: head normocephalic and atraumatic.   Neck: supple with no carotid or supraclavicular bruits Cardiovascular: regular rate and rhythm, no murmurs Musculoskeletal: no deformity Skin:  no rash/petichiae Vascular:  Normal pulses all extremities   Neurologic Exam Mental Status: Awake and fully alert. Mild dysarthria. Oriented to place and time. Recent and remote memory impaired. Attention span, concentration and fund of knowledge moderately appropriate but SO continued to provide majority of information.  Mood and affect appropriate.  Cranial Nerves: Pupils equal, briskly reactive to light. Extraocular movements full without nystagmus. Visual fields full to confrontation. Hearing intact. Facial sensation intact.  Mild left lower facial weakness Motor: Normal bulk and tone. Normal strength in all tested extremity muscles  Sensory.: intact to touch , pinprick , position and vibratory sensation.  Coordination: Rapid alternating movements normal in all extremities. Finger-to-nose and heel-to-shin performed accurately bilaterally. Gait and Station: Arises from chair without  difficulty. Stance is normal. Gait demonstrates normal stride length and balance without use of assistive device Reflexes: 1+ and symmetric. Toes downgoing.       ASSESSMENT: Scott Day is a 60 y.o. year old male presented with left-sided weakness on 06/04/2019 with stroke work-up revealing right MCA territory infarct with right M1 distal occlusion s/p IR with right M1 stent placement, embolic secondary to unknown source.  Recommended consideration of loop recorder placement and hypercoagulable labs outpatient.  Vascular risk factors include HLD, new  dx DM, former tobacco use and EtOH use.      PLAN:  1. Right MCA stroke s/p IR:  a. Residual deficits: Neurocognitive impairment with behaviors and dysarthria.continued follow-up with neuropsychology for CBT with ongoing benefit.  Will change Seroquel 25 mg nightly to as needed for sleep.  Request PCP continue to manage and prescribe mood stabilizing medications.  Discussed establishing care with psychiatry especially with underlying (SO reports) bipolar disorder but he declines at this time b. Continue aspirin 325 mg daily and Brilinta (ticagrelor) 90 mg bid  and Crestor 20 mg daily for secondary stroke prevention c. Hypercoagulable labs negative.  Declines loop recorder placement d. Right M1 stent placement: f/u visit April.  Ongoing duration of Brilinta will be determined by IR e. Discussed secondary stroke prevention measures and importance of close PCP follow-up for aggressive stroke risk factor management f. HTN: BP goal<130/90.  Stable.  Managed by PCP g. HLD: LDL goal <70.  Intolerant to atorvastatin due to myalgias.  Continue Crestor 20 mg daily and follow-up with PCP for ongoing prescribing, monitoring and management 2. Sleep apnea: Recent diagnosis 04/21/2020 showing severe OSA with total AHI of 51.7/h.  Currently awaiting CPAP machine monitored/managed by PCP    Follow up in 6 months or call earlier if needed  CC:  [facility]  provider: Dr. Valarie Merino, Charlane Ferretti, MD    I spent 30 minutes of face-to-face and non-face-to-face time with patient and SO. This included previsit chart review, lab review, study review, order entry, electronic health record documentation, patient education regarding prior stroke and residual deficits, residual deficits with large discussion around cognitive impairment and behaviors, importance of managing stroke risk factors and answered all questions to patient and SO's satisfaction    Frann Rider, [facility]  [facility] [address] [place], [place] [address]  Phone [phone] Fax [phone] Note: This document was prepared with digital dictation and possible smart phrase technology. Any transcriptional errors that result from this process are unintentional.

## 2020-05-06 NOTE — Progress Notes (Signed)
Subjective:    Patient ID: Scott Day, male    DOB: 08-31-1960, 60 y.o.   MRN: [id number]  HPI Right-handed male with history of colon cancer 2012, GERD, tobacco abuse presents for follow-up for right MCA territory infarction with right M1 distal occlusion status post stenting.    Significant other supplements history. He was last seen in clinic on 04/14/2020.  Since that time, patient states he is being active. He is driving without difficulty. BP is slightly elevated, but "been rushing around" this AM. He was seen by Neuropsych. He was started on medication to help decrease tobacco abuse. He is having increase in aggression and mood lability. He seen Neuro after this appointment. He had a sleep study with plans to start a CPAP.  Pain Inventory Average Pain 0 Pain Right Now 0 My pain is no pain today  LOCATION OF PAIN  Head  BOWEL Number of stools per week: 7 Oral laxative use No  Type of laxative none Enema or suppository use No  History of colostomy No  Incontinent No   BLADDER Normal In and out cath, frequency  Able to self cath No  Bladder incontinence No  Frequent urination Yes   Awakens at night Leakage with coughing No  Difficulty starting stream No  Incomplete bladder emptying No    Mobility walk without assistance how many minutes can you walk? 20  Function not employed: date last employed . I need assistance with the following:  meal prep, household duties and shopping  Neuro/Psych bladder control problems confusion depression anxiety  Prior Studies Any changes since last visit?  yes sleep study done 1/31  Physicians involved in your care Any changes since last visit?  no   Family History  Problem Relation Age of Onset  . Cancer Mother        primary unknown  . Colon cancer Neg Hx    Social History   Socioeconomic History  . Marital status: Divorced    Spouse name: Not on file  . Number of children: Not on file  . Years of  education: Not on file  . Highest education level: Not on file  Occupational History  . Not on file  Tobacco Use  . Smoking status: Current Every Day Smoker    Packs/day: 1.00    Start date: 07/20/2019  . Smokeless tobacco: Never Used  . 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 Vaping Use: Former  Substance and Sexual Activity  . Alcohol use: Yes    Alcohol/week: 24.0 standard drinks    Types: 24 Cans of beer per week    Comment: 12 to 24 beers daily.  As of 04/29/2020 patient reports drinking 3 to 4 days/week.  Between 3-greater than 12 beers at a time.  . Drug use: Never  . Sexual activity: Not on file  Other Topics Concern  . Not on file  Social History Narrative  . Not on file   Social Determinants of Health   Financial Resource Strain: Not on file  Food Insecurity: Not on file  Transportation Needs: Not on file  Physical Activity: Not on file  Stress: Not on file  Social Connections: Not on file   Past Surgical History:  Procedure Laterality Date  . BUBBLE STUDY  06/08/2019   Procedure: BUBBLE STUDY;  Surgeon: Buford Dresser, MD;  Location: [facility] ENDOSCOPY;  Service: Cardiovascular;;  . COLON SURGERY  2004   colon cancer  .  COLONOSCOPY  2011   Dr. Fuller Plan: Evidence of right hemicolectomy, polyps removed from the rectum, hyperplastic  . IR ANGIO INTRA EXTRACRAN SEL COM CAROTID INNOMINATE BILAT MOD SED  01/08/2020  . IR ANGIO VERTEBRAL SEL SUBCLAVIAN INNOMINATE BILAT MOD SED  01/08/2020  . IR CT HEAD LTD  06/04/2019  . IR CT HEAD LTD  06/04/2019  . IR INTRA CRAN STENT  06/04/2019  . IR PERCUTANEOUS ART THROMBECTOMY/INFUSION INTRACRANIAL INC DIAG ANGIO  06/04/2019  . IR US GUIDE VASC ACCESS RIGHT  06/04/2019  . OPEN REDUCTION INTERNAL FIXATION (ORIF) DISTAL RADIAL FRACTURE Left 09/20/2017   Procedure: OPEN REDUCTION INTERNAL FIXATION (ORIF) DISTAL RADIAL FRACTURE;  Surgeon: Leanora Cover, MD;  Location: [facility];  Service:  Orthopedics;  Laterality: Left;  . RADIOLOGY WITH ANESTHESIA N/A 06/04/2019   Procedure: IR WITH ANESTHESIA;  Surgeon: Radiologist, Medication, MD;  Location: [place];  Service: Radiology;  Laterality: N/A;  . TEE WITHOUT CARDIOVERSION N/A 06/08/2019   Procedure: TRANSESOPHAGEAL ECHOCARDIOGRAM (TEE);  Surgeon: Buford Dresser, MD;  Location: [facility] ENDOSCOPY;  Service: Cardiovascular;  Laterality: N/A;   Past Medical History:  Diagnosis Date  . Cancer [facility] 2012   colon cancer  . Closed fracture of left distal radius   . Colon cancer ([place])   . CVA (cerebral vascular accident) ([place]) 05/2019    right MCA territory infarction with right M1 distal occlusion status post stenting.    . Diabetes ([place])   . GERD (gastroesophageal reflux disease)   . Hypertension    BP (!) 155/88   Pulse 76   Temp 98.6 F (37 C)   Ht 5\' 10"  (1.778 m)   Wt 242 lb 9.6 oz (110 kg)   SpO2 96%   BMI 34.81 kg/m   Opioid Risk Score:   Fall Risk Score:  `1  Depression screen PHQ 2/9  Depression screen [facility] 2/9 05/06/2020 04/29/2020 04/14/2020 02/26/2020 01/18/2020 11/27/2019 08/21/2019  Decreased Interest 1 1 1 1  0 0 3  Down, Depressed, Hopeless 1 1 1 1  0 0 3  PHQ - 2 Score 2 2 2 2  0 0 6  Altered sleeping - 2 - - - - -  Tired, decreased energy - 2 - - - - -  Change in appetite - 1 - - - - -  Feeling bad or failure about yourself  - 0 - - - - -  Trouble concentrating - 1 - - - - -  Moving slowly or fidgety/restless - 0 - - - - -  Suicidal thoughts - 0 - - - - -  PHQ-9 Score - 8 - - - - -  Difficult doing work/chores - - - - - - -   Review of Systems  Constitutional: Negative.   HENT: Negative.   Eyes: Negative.   Respiratory: Negative.   Cardiovascular: Negative.   Gastrointestinal: Negative.   Endocrine: Negative.   Genitourinary: Negative.   Musculoskeletal: Negative.   Skin: Negative.   Allergic/Immunologic: Negative.   Neurological: Positive for dizziness and weakness.  Hematological: Negative.    Psychiatric/Behavioral: Positive for agitation, confusion, dysphoric mood and sleep disturbance. The patient is nervous/anxious.   All other systems reviewed and are negative.     Objective:   Physical Exam  Constitutional: No distress . Vital signs reviewed. HENT: Normocephalic.  Atraumatic. Eyes: EOMI. No discharge. Cardiovascular: No JVD.   Respiratory: Normal effort.  No stridor.   GI: Non-distended.   Skin: Warm and dry.  Intact. Psych: Delayed. Normal  behavior. Musc: No edema in extremities.  No tenderness in extremities. Neurologic: Alert and oriented x2-3 Motor:  LUE 4+-5/5 proximal to distal, stable LLE: 4+/5 proximal distal    Assessment & Plan:  Right-handed male with history of colon cancer 2012, GERD, tobacco abuse presents for follow-up for right MCA territory infarction with right M1 distal occlusion status post stenting.     1.  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   Continue to follow up with Neurology             Released from therapies per patient, encouraged HEP             Loop in place  Driving locally at present  Will consider methylphenidate - after CPAP  Attempting to apply for SSD.  2.  Hypertension.    Elevated today, recent med changes by PCP  3.  Tobacco abuse/Etoh abuse             Encouraged abstinence again, educated on smoking cessation again, continues to smoke 1 PPD             Following up with PCP with meds   4. Mood instability - exacerbated by stressors with anger             Follow up on med recs             Continue to monitor             Continue CBT  Continue Depakote 250 BID to ER  Seroquel per Neuro - recommended discussion, appointment today  6. OSA  Awaiting CPAP

## 2020-05-06 NOTE — Patient Instructions (Addendum)
Recommend changing seroquel from nightly to as needed for sleep  Continue to follow with Dr. Darol Destine regarding mood stability and counseling   Continue aspirin 325 mg daily and Brilinta (ticagrelor) 90 mg bid  and Crestor 20mg  daily  for secondary stroke prevention  Ensure follow up with Dr. Estanislado Pandy as scheduled in April  Continue to follow up with PCP regarding cholesterol and blood pressure management  Maintain strict control of hypertension with blood pressure goal below 130/90 and cholesterol with LDL cholesterol (bad cholesterol) goal below 70 mg/dL.      Followup in the future with me in 6 months or call earlier if needed     Thank you for coming to see [place] at [facility]. I hope we have been able to provide you high quality care today.  You may receive a patient satisfaction survey over the next few weeks. We would appreciate your feedback and comments so that we may continue to improve ourselves and the health of our patients.

## 2020-05-07 LAB — CUP PACEART REMOTE DEVICE CHECK
Date Time Interrogation Session: 20220215215009
Implantable Pulse Generator Implant Date: 20210924

## 2020-05-08 LAB — LIPID PANEL
Chol/HDL Ratio: 4.2 ratio (ref 0.0–5.0)
Cholesterol, Total: 163 mg/dL (ref 100–199)
HDL: 39 mg/dL — ABNORMAL LOW (ref >39)
LDL Chol Calc (NIH): 89 mg/dL (ref 0–99)
Triglycerides: 204 mg/dL — ABNORMAL HIGH (ref 0–149)
VLDL Cholesterol Cal: 35 mg/dL (ref 5–40)

## 2020-05-08 LAB — CMP14+EGFR
ALT: 40 IU/L (ref 0–44)
AST: 25 IU/L (ref 0–40)
Albumin/Globulin Ratio: 1.7 (ref 1.2–2.2)
Albumin: 4.4 g/dL (ref 3.8–4.9)
Alkaline Phosphatase: 84 IU/L (ref 44–121)
BUN/Creatinine Ratio: 11 (ref 10–24)
BUN: 11 mg/dL (ref 8–27)
Bilirubin Total: 0.3 mg/dL (ref 0.0–1.2)
CO2: 23 mmol/L (ref 20–29)
Calcium: 9.4 mg/dL (ref 8.6–10.2)
Chloride: 103 mmol/L (ref 96–106)
Creatinine, Ser: 0.98 mg/dL (ref 0.76–1.27)
GFR calc Af Amer: 96 mL/min/{1.73_m2} (ref >59)
GFR calc non Af Amer: 83 mL/min/{1.73_m2} (ref >59)
Globulin, Total: 2.6 g/dL (ref 1.5–4.5)
Glucose: 131 mg/dL — ABNORMAL HIGH (ref 65–99)
Potassium: 4.7 mmol/L (ref 3.5–5.2)
Sodium: 141 mmol/L (ref 134–144)
Total Protein: 7 g/dL (ref 6.0–8.5)

## 2020-05-08 LAB — MICROALBUMIN / CREATININE URINE RATIO
Creatinine, Urine: 118.3 mg/dL
Microalb/Creat Ratio: 4 mg/g creat (ref 0–29)
Microalbumin, Urine: 4.3 ug/mL

## 2020-05-08 LAB — HCV RNA QUANT RFLX ULTRA OR GENOTYP: HCV Quant Baseline: NOT DETECTED IU/mL

## 2020-05-12 ENCOUNTER — Ambulatory Visit (INDEPENDENT_AMBULATORY_CARE_PROVIDER_SITE_OTHER): Payer: Medicaid Other

## 2020-05-12 DIAGNOSIS — I639 Cerebral infarction, unspecified: Secondary | ICD-10-CM

## 2020-05-13 ENCOUNTER — Encounter: Payer: Medicaid Other | Admitting: Psychology

## 2020-05-15 NOTE — Progress Notes (Signed)
Carelink Summary Report / Loop Recorder 

## 2020-05-20 ENCOUNTER — Encounter: Payer: Medicaid Other | Attending: Physical Medicine & Rehabilitation | Admitting: Psychology

## 2020-05-20 ENCOUNTER — Other Ambulatory Visit: Payer: Self-pay

## 2020-05-20 DIAGNOSIS — F0391 Unspecified dementia with behavioral disturbance: Secondary | ICD-10-CM | POA: Insufficient documentation

## 2020-05-20 DIAGNOSIS — R454 Irritability and anger: Secondary | ICD-10-CM | POA: Diagnosis not present

## 2020-05-20 DIAGNOSIS — I639 Cerebral infarction, unspecified: Secondary | ICD-10-CM | POA: Diagnosis not present

## 2020-05-20 DIAGNOSIS — G8194 Hemiplegia, unspecified affecting left nondominant side: Secondary | ICD-10-CM | POA: Diagnosis present

## 2020-05-20 DIAGNOSIS — Z720 Tobacco use: Secondary | ICD-10-CM | POA: Insufficient documentation

## 2020-05-20 DIAGNOSIS — F17200 Nicotine dependence, unspecified, uncomplicated: Secondary | ICD-10-CM | POA: Diagnosis not present

## 2020-05-20 DIAGNOSIS — I69319 Unspecified symptoms and signs involving cognitive functions following cerebral infarction: Secondary | ICD-10-CM | POA: Insufficient documentation

## 2020-05-20 NOTE — Progress Notes (Addendum)
NEUROPSYCHOLOGY VISIT   Therapy Progress Note   Type of Treatment:                Health Behavioral Intervention Visit Number:                         12 Start:                                       9:00AM End:                                         10:00AM  Subjective:    Patient ID: Scott Day is a 60 y.o. male. DOB: 1960/05/14 MRN: [id number]  Chief Complaint: Cognitive and mood issues post stroke, difficulty controlling anger, tobacco abuse   HPI   Scott Day is a 60 y.o. year old male presented with left-sided weakness on 06/04/2019 with stroke work-up revealing right MCA territory infarct with right M1 distal occlusion s/p IR with right M1 stent placement, embolic secondary to unknown source.  Recommended consideration of loop recorder placement and hypercoagulable labs outpatient.  Vascular risk factors include HLD, new dx DM, former tobacco use and EtOH use.  The following portions of the patient's history were reviewed and updated as appropriate: allergies, current medications, past family history, past medical history, past social history, past surgical history and problem list.  Review of Systems   Had follow up appointment with Dr. Posey Pronto on 05/06/20. During that visit, patient reported that he had been staying active during the week and driving without difficulty. Pt was started on medication to help decrease tobacco abuse. Still having increase in aggression and mood lability. Had sleep study with plans to start CPAP.   Had 6 month follow up with Frann Rider, AGNP-BC on 05/06/20. Progress note from that visit suggests plan to change Seroquel 25 mg nightly to as needed for sleep. PCP was requested to continue to manage and prescribe mood stabilizing medications. Discussed establishing care with psychiatry due to possible underlying (SO reports) bipolar disorder but Pt declined.   Mood stabilization: Bupropion 150 mg daily, Depakote ER 250 mg daily, fluoxetine 20 mg  daily and Seroquel 25 mg nightly. SO reports worsening of mood since starting bupropion previous week; started by PCP for smoking cessation.  She has concerns regarding ongoing need and indication of seroquel as well as possible dosage increase on fluoxetine. Denies new or worsening stroke/TIA symptoms  Continued tobacco use as well as EtOH use with daily amt fluctuating (depending on what he is doing or who he is around). Diagnosed with severe OSA with total AHI of 51.7/h. Awaiting CPAP machine monitored/managed by PCP. Currently in the process of applying for SSD.   Neurologic Exam (05/06/20) Mental Status: Awake and fully alert. Mild dysarthria.Oriented to place and time. Recent and remote memory impaired. Attention span, concentration and fund of knowledge moderately appropriate but SO continued to provide majority of information.  Mood and affect appropriate.  Cranial Nerves: Pupils equal, briskly reactive to light. Extraocular movements full without nystagmus. Visual fields full to confrontation. Hearing intact. Facial sensation intact.  Mild left lower facial weakness Motor: Normal bulk and tone. Normal strength in all tested extremity muscles  Sensory.: intact to touch , pinprick ,  position and vibratory sensation.  Coordination: Rapid alternating movements normal in all extremities. Finger-to-nose and heel-to-shin performed accurately bilaterally. Gait and Station: Arises from chair without difficulty. Stance is normal. Gait demonstratesnormal stride length and balance without use of assistive device Reflexes: 1+ and symmetric. Toes down going  Previous Visit (04/29/20): Patient accompanied by partner. Patient described having another difficult conversation about his continued tobacco abuse during last meeting with Dr. Posey Pronto (04/15/19). Patient states that he is interested in using nicotine oral inhaler to help quit.   He is working on managing anger and trying to avoid outbursts. He continues  to yell and curse at his partner most days but also taking proactive steps (e.g., recognizing triggers and leaving volatile situations to prevent physical aggression. His partner corroborates this.   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 He worries about paying bills and taking care of himself financially. Expresses Interest in applying for SSID.   Current Visit: Unaccompanied today (partner in lobby). No physical blowouts in the last 2 weeks. Sleep study showed evidence of OSA.  Waiting for CPAP. Using one (unfitted) from partner's friend for now.  Trying to smoke 1 less cigarette per day. Interested in nicotine replacement via inhaler device. Reports drinking around 12 pack of beer 1-2 days a week. Denies using illicit substances.   Objective:  Physical Exam Psychiatric:        Attention and Perception: He is inattentive. He does not perceive auditory or visual hallucinations.        Mood and Affect: Mood is not anxious, depressed or elated. Affect is not labile, blunt, flat, angry or tearful.        Speech: He is communicative. Speech is rapid and pressured and tangential. Speech is not delayed or slurred.        Behavior: Behavior is agitated, aggressive and combative (at times). Behavior is not slowed, withdrawn or hyperactive. Behavior is cooperative.        Thought Content: Thought content normal. Thought content is not paranoid or delusional. Thought content does not include homicidal or suicidal ideation. Thought content does not include homicidal or suicidal plan.        Cognition and Memory: He exhibits impaired recent memory (mild weakness). He does not exhibit impaired remote memory.        Judgment: Judgment is impulsive and inappropriate (limited by impulsivity).   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   Therapist Response: Provided continued education regarding stroke and residual deficits. Emphasized importance of managing stroke risk factors, including limiting smoking cigarettes and consuming alcohol. Continued to explore earliest warning signs of anger and adaptive responses/techniques to prevent escalation. Used "Anger Stop Sign" worksheet to reinforce learning.  Practiced relaxation and anger management techniques (e.g., take time out, deep breathing, stop to think, visualization, etc.)   Plan:   We have 3 more scheduled visits to continue working on anger management and reducing outburst.   Next Appointment: 05/29/20 at 11:00AM   Billing/Service Summary:  [phone] (Health behavior intervention, individual, face-to-face; initial 30 minutes) x1 [phone] (Health  behavior intervention, individual, face-to-face; each additional 15 minutes) x 2

## 2020-05-29 ENCOUNTER — Encounter: Payer: Self-pay | Admitting: Psychology

## 2020-05-29 ENCOUNTER — Encounter: Payer: Medicaid Other | Admitting: Psychology

## 2020-05-29 ENCOUNTER — Other Ambulatory Visit: Payer: Self-pay

## 2020-05-29 DIAGNOSIS — I639 Cerebral infarction, unspecified: Secondary | ICD-10-CM | POA: Diagnosis not present

## 2020-05-29 DIAGNOSIS — F0391 Unspecified dementia with behavioral disturbance: Secondary | ICD-10-CM

## 2020-05-29 DIAGNOSIS — Z720 Tobacco use: Secondary | ICD-10-CM

## 2020-05-29 DIAGNOSIS — R454 Irritability and anger: Secondary | ICD-10-CM | POA: Diagnosis not present

## 2020-05-29 NOTE — Progress Notes (Addendum)
NEUROPSYCHOLOGY VISIT   Therapy Progress Note   Type of Treatment:                Health Behavioral Intervention  Visit Number:                         13 Start Time:                              9:00AM End Time:                                10:00AM  Subjective:    Patient ID: Scott Day is a 60 y.o. male. DOB: 20-Oct-1960  MRN: [id number]  Chief Complaint: Cognitive and mood issues post stroke, difficulty controlling anger, Tobacco abuse  HPI   Scott Day a60 y.o.year old malepresented with left-sided weakness on 06/04/2019 with stroke work-up revealing right MCA territory infarct with right M1 distal occlusion s/p IR with right M1 stent placement, embolic secondary to unknown source. Recommended consideration of loop recorder placement and hypercoagulable labs outpatient. Vascular risk factors include HLD, new dx DM, former tobacco use and EtOH use.  The following portions of the patient's history were reviewed and updated as appropriate: allergies, current medications, past family history, past medical history, past social history, past surgical history and problem list.   Review of Systems   Previous Visit (05/20/20): Unaccompanied today (partner in lobby). No physical blowouts in the last 2 weeks. Sleep study showed evidence of OSA.  Waiting for CPAP. Using one (unfitted) from partner's friend for now.  Trying to smoke 1 less cigarette per day. Interested in nicotine replacement via inhaler device. Reports drinking around 12 pack of beer 1-2 days a week. Denies using illicit substances.   Current Visit (05/29/20): Unccompanied today; partner in lobby.  Describes mood as "very good"., No physical blowouts and last 4 weeks.  Having more positive and interactions with partner. Looking out for mood changes and early signs of anger anwalking away from "bad situations". Reportedly taking the "high road" more if/when triggered by partner.  Helping partner more around the house.   Falls and stays asleep easily/longer at night with current medication regimen. Waiting for CPAP.  Still trying to smoke 1 last cigarette each day but struggling. Very interested in nicotine replacement via inhaler. Worries about paying bills due to current unemployment and uncertain future.   Objective:  Physical Exam Psychiatric:        Attention and Perception: He is inattentive (mild). He does not perceive auditory or visual hallucinations.        Mood and Affect: Mood is not anxious, depressed or elated. Affect is angry (at times). Affect is not labile, blunt, flat, tearful or inappropriate.        Speech: He is communicative. Speech is tangential. Speech is not rapid and pressured, delayed or slurred.        Behavior: Behavior is agitated (moderate; improving ), aggressive, hyperactive (mild) and combative (at times). Behavior is not slowed or withdrawn. Behavior is cooperative.        Thought Content: Thought content is paranoid (mild). Thought content is not delusional. Thought content does not include homicidal or suicidal ideation. Thought content does not include homicidal or suicidal plan.        Cognition and Memory: Cognition is impaired (  Mild). He exhibits impaired recent memory. He does not exhibit impaired remote memory.        Judgment: Judgment is impulsive and inappropriate (poor).   Lab Review:  not applicable  Assessment:   Major neurocognitive disorder probably due to vascular disease, with behavioral disturbance (HCC)  Cryptogenic stroke (HCC)  Difficulty controlling anger  Tobacco abuse   Intervention: CBT: Anger/Self-management, emotion regulation  Participation: Alert and active  Response/Effectiveness: Fair   Progress: Fair-Good. Outbursts have reportedly decrease per patient and partner report.   Therapist Response: Reviewed anger triggers and cycle, including warning signs. Explored healthy vs. Unhealthy coping strategies. Practiced deep breathing.  Role-playing exercises to demonstrate expressing anger without being confrontational.   Encouraged reducing current EtOH consumption by 1/3 (e.g., 8/12 beers) in addition to smoking 1-2 less cigarettes per day.   Plan:   2 more scheduled visits to continue working on building coping skills to manage anger and reduce risk for violence.    Next Appointment: 06/10/20 at 9:00AM  Billing/Service Summary:  [phone] (Health behavior intervention, individual, face-to-face; initial 30 minutes) x1 [phone] (Health behavior intervention, individual, face-to-face; each additional 15 minutes) x 2

## 2020-06-04 ENCOUNTER — Ambulatory Visit: Payer: Medicaid Other | Attending: Family Medicine | Admitting: Audiologist

## 2020-06-04 ENCOUNTER — Other Ambulatory Visit: Payer: Self-pay

## 2020-06-04 DIAGNOSIS — H903 Sensorineural hearing loss, bilateral: Secondary | ICD-10-CM

## 2020-06-04 NOTE — Procedures (Signed)
  Outpatient Audiology and [facility], [place]  [address] [phone]  AUDIOLOGICAL  EVALUATION  NAME: Scott Day     DOB:   [birth year]      MRN: [id number]                                                                                     DATE: 06/04/2020     REFERENT: Charlott Rakes, MD STATUS: Outpatient DIAGNOSIS:  Sensorineural hearing loss, bilateral  History: Scott Day was seen for an audiological evaluation. Scott Day was accompanied to the appointment by his girlfriend and self identified care taker.  Scott Day is receiving a hearing evaluation due to concerns for hearing loss in both ears. Scott Day has difficulty hearing in background noise, crowds, and when people are at a distance. Scott Day knows Scott Day is having a harder time with high pitched sounds. This difficulty began gradually but was made significantly worse after Scott Day suffered from a cryptogenic stroke. Scott Day suffered the left sided stroke on 06/04/2019. Stroke deemed to be from vascular disease. Scott Day is a smoker.  No pain or pressure reported in either ear. Tinnitus denied for both ears. Scott Day has a history of noise exposure.  Medical history positive for stroke and cognitive impairment which is a risk factor for hearing loss. No other relevant case history reported.    Evaluation:   Otoscopy showed a clear view of the tympanic membranes, bilaterally  Tympanometry results were consistent with normal middle ear pressure bilaterally   Distortion Product Otoacoustic Emissions (DPOAE's) were present at 1.5k only, absent 2k-12k Hz bilaterally. DPOAEs tested as objective confirmation of behavioral results due to cognitive impairment.   Audiometric testing was completed using conventional audiometry with insert transducer. Speech Recognition Thresholds were consistent with pure tone averages. Word Recognition was good at an elevated level. Pure tone thresholds show normal sloping to profound sensorineural  hearing loss in both ears. Test results are consistent with symmetric hearing loss.   Results:  The test results were reviewed with Scott Day. Scott Day  was counseled on the nature and degree of his hearing loss. Scott Day was provided with several copies of his audiogram that illustrate his degree of hearing loss in both ears. His hearing loss is in the high frequencies preventing Scott Day from hearing high frequency consonants such as /s/ /sh/ /f/ /t/ and /th/. These sounds help differentiate the words Scott Day hears. Without these sounds, speech is muffled and unclear unless someone is face to face within 5 feet without a mask. Infant is an excellent hearing aid candidate. With hearing aids the clarity of speech will improve and Gwyn will not have to work so hard to hear. Mat was provided with a list of audiologists that dispense hearing aids.   Recommendations: 1. Amplification is necessary for both ears. Hearing aids can be purchased from a variety of locations. See provided list for locations in the [place] area.    Alfonse Alpers  Audiologist, Au.D., CCC-A 06/04/2020  11:41 AM  Cc: Charlott Rakes, MD

## 2020-06-10 ENCOUNTER — Encounter: Payer: Medicaid Other | Admitting: Psychology

## 2020-06-12 MED ORDER — DIVALPROEX SODIUM ER 250 MG PO TB24: 250.0000 mg | ORAL_TABLET | Freq: Every day | ORAL | 1 refills | Status: DC

## 2020-06-14 LAB — CUP PACEART REMOTE DEVICE CHECK
Date Time Interrogation Session: 20220320215116
Implantable Pulse Generator Implant Date: 20210924

## 2020-06-16 ENCOUNTER — Ambulatory Visit (INDEPENDENT_AMBULATORY_CARE_PROVIDER_SITE_OTHER): Payer: Medicaid Other

## 2020-06-16 DIAGNOSIS — I639 Cerebral infarction, unspecified: Secondary | ICD-10-CM

## 2020-06-17 ENCOUNTER — Encounter: Payer: Self-pay | Admitting: Physical Medicine & Rehabilitation

## 2020-06-17 ENCOUNTER — Other Ambulatory Visit: Payer: Self-pay

## 2020-06-17 ENCOUNTER — Encounter: Payer: Medicaid Other | Admitting: Physical Medicine & Rehabilitation

## 2020-06-17 VITALS — BP 157/88 | HR 63 | Temp 97.8°F | Ht 69.0 in | Wt 244.6 lb

## 2020-06-17 DIAGNOSIS — I69319 Unspecified symptoms and signs involving cognitive functions following cerebral infarction: Secondary | ICD-10-CM | POA: Diagnosis not present

## 2020-06-17 DIAGNOSIS — G8194 Hemiplegia, unspecified affecting left nondominant side: Secondary | ICD-10-CM

## 2020-06-17 DIAGNOSIS — I639 Cerebral infarction, unspecified: Secondary | ICD-10-CM | POA: Diagnosis not present

## 2020-06-17 DIAGNOSIS — F0391 Unspecified dementia with behavioral disturbance: Secondary | ICD-10-CM | POA: Diagnosis not present

## 2020-06-17 DIAGNOSIS — R454 Irritability and anger: Secondary | ICD-10-CM

## 2020-06-17 DIAGNOSIS — Z720 Tobacco use: Secondary | ICD-10-CM

## 2020-06-17 NOTE — Progress Notes (Signed)
Subjective:    Patient ID: Scott Day, male    DOB: 1960/11/03, 60 y.o.   MRN: [id number]  HPI Right-handed male with history of colon cancer 2012, GERD, tobacco abuse presents for follow-up for right MCA territory infarction with right M1 distal occlusion status post stenting.    Significant other supplements history. He was last seen in clinic on 05/06/20.  Since that time, pt states he continues to follow up with Neuro. He is doing HEP. He still has not received his CPAP. He continues to apply for SSD. BP is elevated, continues to follow up with PCP. He is trying to quit smoking. He continues to follow up with Neuropsych, mood is improving.  Seroquel was changed to prn by Neuro. Denies falls.  Pain Inventory Average Pain 0 Pain Right Now 0 My pain is no pain today  LOCATION OF PAIN  Head  BOWEL Number of stools per week: 7 Oral laxative use No  Type of laxative none Enema or suppository use No  History of colostomy No  Incontinent No   BLADDER Normal In and out cath, frequency  Able to self cath No  Bladder incontinence No  Frequent urination Yes   Awakens at night Leakage with coughing No  Difficulty starting stream No  Incomplete bladder emptying No    Mobility walk without assistance how many minutes can you walk? 20  Function not employed: date last employed . I need assistance with the following:  meal prep, household duties and shopping  Neuro/Psych bladder control problems confusion depression anxiety  Prior Studies Any changes since last visit?  yes sleep study done 1/31  Physicians involved in your care Any changes since last visit?  no   Family History  Problem Relation Age of Onset  . Cancer Mother        primary unknown  . Colon cancer Neg Hx    Social History   Socioeconomic History  . Marital status: Divorced    Spouse name: Not on file  . Number of children: Not on file  . Years of education: Not on file  . Highest education  level: Not on file  Occupational History  . Not on file  Tobacco Use  . Smoking status: Current Every Day Smoker    Packs/day: 1.00    Start date: 07/20/2019  . Smokeless tobacco: Never Used  . 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 Vaping Use: Former  Substance and Sexual Activity  . Alcohol use: Yes    Alcohol/week: 24.0 standard drinks    Types: 24 Cans of beer per week    Comment: 12 to 24 beers daily.  As of 04/29/2020 patient reports drinking 3 to 4 days/week.  Between 3-greater than 12 beers at a time.  . Drug use: Never  . Sexual activity: Not on file  Other Topics Concern  . Not on file  Social History Narrative  . Not on file   Social Determinants of Health   Financial Resource Strain: Not on file  Food Insecurity: Not on file  Transportation Needs: Not on file  Physical Activity: Not on file  Stress: Not on file  Social Connections: Not on file   Past Surgical History:  Procedure Laterality Date  . BUBBLE STUDY  06/08/2019   Procedure: BUBBLE STUDY;  Surgeon: Buford Dresser, MD;  Location: [facility] ENDOSCOPY;  Service: Cardiovascular;;  . COLON SURGERY  2004   colon cancer  .  COLONOSCOPY  2011   Dr. Fuller Plan: Evidence of right hemicolectomy, polyps removed from the rectum, hyperplastic  . IR ANGIO INTRA EXTRACRAN SEL COM CAROTID INNOMINATE BILAT MOD SED  01/08/2020  . IR ANGIO VERTEBRAL SEL SUBCLAVIAN INNOMINATE BILAT MOD SED  01/08/2020  . IR CT HEAD LTD  06/04/2019  . IR CT HEAD LTD  06/04/2019  . IR INTRA CRAN STENT  06/04/2019  . IR PERCUTANEOUS ART THROMBECTOMY/INFUSION INTRACRANIAL INC DIAG ANGIO  06/04/2019  . IR US GUIDE VASC ACCESS RIGHT  06/04/2019  . OPEN REDUCTION INTERNAL FIXATION (ORIF) DISTAL RADIAL FRACTURE Left 09/20/2017   Procedure: OPEN REDUCTION INTERNAL FIXATION (ORIF) DISTAL RADIAL FRACTURE;  Surgeon: Leanora Cover, MD;  Location: [place];  Service: Orthopedics;  Laterality: Left;  .  RADIOLOGY WITH ANESTHESIA N/A 06/04/2019   Procedure: IR WITH ANESTHESIA;  Surgeon: Radiologist, Medication, MD;  Location: [place];  Service: Radiology;  Laterality: N/A;  . TEE WITHOUT CARDIOVERSION N/A 06/08/2019   Procedure: TRANSESOPHAGEAL ECHOCARDIOGRAM (TEE);  Surgeon: Buford Dresser, MD;  Location: [facility] ENDOSCOPY;  Service: Cardiovascular;  Laterality: N/A;   Past Medical History:  Diagnosis Date  . Cancer [facility]) 2012   colon cancer  . Closed fracture of left distal radius   . Colon cancer ([place])   . CVA (cerebral vascular accident) ([place]) 05/2019    right MCA territory infarction with right M1 distal occlusion status post stenting.    . Diabetes ([place])   . GERD (gastroesophageal reflux disease)   . Hypertension    BP (!) 157/88   Pulse 63   Temp 97.8 F (36.6 C)   Ht 5\' 9"  (1.753 m)   Wt 244 lb 9.6 oz (110.9 kg)   SpO2 99%   BMI 36.12 kg/m   Opioid Risk Score:   Fall Risk Score:  `1  Depression screen PHQ 2/9  Depression screen [facility] 2/9 05/06/2020 04/29/2020 04/14/2020 02/26/2020 01/18/2020 11/27/2019 08/21/2019  Decreased Interest 1 1 1 1  0 0 3  Down, Depressed, Hopeless 1 1 1 1  0 0 3  PHQ - 2 Score 2 2 2 2  0 0 6  Altered sleeping - 2 - - - - -  Tired, decreased energy - 2 - - - - -  Change in appetite - 1 - - - - -  Feeling bad or failure about yourself  - 0 - - - - -  Trouble concentrating - 1 - - - - -  Moving slowly or fidgety/restless - 0 - - - - -  Suicidal thoughts - 0 - - - - -  PHQ-9 Score - 8 - - - - -  Difficult doing work/chores - - - - - - -   Review of Systems  Constitutional: Negative.   HENT: Negative.   Eyes: Negative.   Respiratory: Negative.   Cardiovascular: Negative.   Gastrointestinal: Negative.   Endocrine: Negative.   Genitourinary: Negative.   Musculoskeletal: Negative.   Skin: Negative.   Allergic/Immunologic: Negative.   Neurological: Positive for dizziness and weakness.  Hematological: Negative.   Psychiatric/Behavioral: Positive  for agitation, confusion, dysphoric mood and sleep disturbance. The patient is nervous/anxious.   All other systems reviewed and are negative.     Objective:   Physical Exam   Constitutional: No distress . Vital signs reviewed. HENT: Normocephalic.  Atraumatic. Eyes: EOMI. No discharge. Cardiovascular: No JVD.   Respiratory: Normal effort.  No stridor.   GI: Non-distended.   Skin: Warm and dry.  Intact. Psych: Delayed.  Normal behavior. Musc: No edema in extremities.  No tenderness in extremities. Neurologic: Alert and oriented 3 Motor:  LUE 4+-5/5 proximal to distal, unchanged LLE: 4+/5 proximal distal    Assessment & Plan:  Right-handed male with history of colon cancer 2012, GERD, tobacco abuse presents for follow-up for right MCA territory infarction with right M1 distal occlusion status post stenting.     1.  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   Continue to follow up with Neurology             Released from therapies per patient, continue HEP             Loop in place  Driving locally at present  Will consider methylphenidate - after CPAP  Attempting to apply for SSD, awaiting.  2.  Hypertension:  Elevated today, changes being made by PCP  3.  Tobacco abuse/Etoh abuse             Encouraged abstinence again, educated on smoking cessation again, continues to smoke ~1-2 PPD             Following up with PCP for med changes   4. Mood instability - exacerbated by stressors with anger             Follow up on med recs             Continue to monitor             Continue CBT  Continue Depakote 250 BID to ER  Seroquel changed to prn by Neuro   6. OSA  Continues to await CPAP

## 2020-06-24 ENCOUNTER — Encounter: Payer: Medicaid Other | Attending: Physical Medicine & Rehabilitation | Admitting: Psychology

## 2020-06-24 ENCOUNTER — Other Ambulatory Visit: Payer: Self-pay

## 2020-06-24 DIAGNOSIS — F0391 Unspecified dementia with behavioral disturbance: Secondary | ICD-10-CM

## 2020-06-24 DIAGNOSIS — R454 Irritability and anger: Secondary | ICD-10-CM

## 2020-06-24 DIAGNOSIS — Z720 Tobacco use: Secondary | ICD-10-CM

## 2020-06-24 NOTE — Progress Notes (Signed)
   NEUROPSYCHOLOGY VISIT   Therapy Progress Note   Type of Treatment:Health Behavioral Intervention Visit Number:14 Start Time:9:00 AM End Time: 10:00 AM  Subjective:    Patient ID: Scott Day is a 60 y.o. male.     DOB: [birth year]                      MRN: [id number]  Chief Complaint: Difficulty controlling anger and mood instability.  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   The following portions of the patient's history were reviewed and updated as appropriate: current medications, past medical history, past surgical history and problem list. Review of Systems  Previous Visit (05/29/20): Unccompanied today; partner in lobby.  Describes mood as "very good"., No physical blowouts and last 4 weeks.  Having more positive and interactions with partner. Looking out for mood changes and early signs of anger anwalking away from "bad situations". Reportedly taking the "high road" more if/when triggered by partner.  Helping partner more around the house.  Falls and stays asleep easily/longer at night with current medication regimen. Waiting for CPAP.  Still trying to smoke 1 last cigarette each day but struggling. Very interested in nicotine replacement via inhaler. Worries about paying bills due to current unemployment and uncertain future.   Current Visit  (06/24/20): Unaccompanied. Drove self to appointment. Denies mood or behavioral changes; describes positive mood. Denies physical outbursts for last 6 weeks. Smoking same amount per day but reportedly drinking less beer.  Denies using illicit substances. Waiting for CPAP. Applied for disability and waiting to hear  back.  He had a follow-up appointment with Dr. Posey Pronto on 06/17/20 that reportedly went well. Was encouraged to stop smoking cigarettes.   Objective:  Physical Exam Neurological:     Mental Status: He is alert and oriented to person, place, and time.  Psychiatric:        Attention and Perception: He is inattentive. He does not perceive auditory or visual hallucinations.        Mood and Affect: Affect is angry (at times).        Speech: Speech is tangential (mild).        Behavior: Behavior is agitated.        Thought Content: Thought content normal.        Judgment: Judgment is impulsive and inappropriate.   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 There have been no reports of physical outbursts or violence in over 6 weeks, representing improvement.   Therapist Response: Reviewed coping strategies for dealing with aggressive behavior and explored triggers (topics of conversation, noise levels, certain events that are happening, etc.) that appear to set off such behavior.   Plan:   Will schedule patient for another 4-8 monthly therapy visits to monitor mood and continue working on anger management   Billing/Service Summary:  [phone] (Health behavior intervention, individual, face-to-face; initial 30 minutes) x1 [phone] (Health behavior intervention, individual, face-to-face; each additional 15 minutes) x 2

## 2020-06-25 NOTE — Progress Notes (Signed)
Carelink Summary Report / Loop Recorder 

## 2020-06-30 ENCOUNTER — Other Ambulatory Visit (HOSPITAL_COMMUNITY): Payer: Self-pay | Admitting: Interventional Radiology

## 2020-06-30 ENCOUNTER — Encounter: Payer: Self-pay | Admitting: Psychology

## 2020-06-30 DIAGNOSIS — I771 Stricture of artery: Secondary | ICD-10-CM

## 2020-07-02 ENCOUNTER — Other Ambulatory Visit: Payer: Self-pay | Admitting: Radiology

## 2020-07-03 ENCOUNTER — Telehealth: Payer: Self-pay | Admitting: *Deleted

## 2020-07-03 ENCOUNTER — Encounter: Payer: Self-pay | Admitting: *Deleted

## 2020-07-03 MED ORDER — NA SULFATE-K SULFATE-MG SULF 17.5-3.13-1.6 GM/177ML PO SOLN: 1.0000 | Freq: Once | ORAL | 0 refills | Status: AC

## 2020-07-03 NOTE — Telephone Encounter (Signed)
Called pt and spoke with with GF (on dpr). TCS/EGD with propofol, Dr. Gala Romney, ASA 3 scheduled for 6/8 at 7:30am. Aware will need pre-op/covid test appt and would mail with prep instrucitons. Confirmed address. Confirmed pharmacy

## 2020-07-04 ENCOUNTER — Ambulatory Visit (HOSPITAL_COMMUNITY)
Admission: RE | Admit: 2020-07-04 | Discharge: 2020-07-04 | Disposition: A | Discharge status: Home / Self Care | Payer: Medicaid Other | Source: Ambulatory Visit | Attending: Interventional Radiology | Admitting: Interventional Radiology

## 2020-07-04 ENCOUNTER — Other Ambulatory Visit: Payer: Self-pay

## 2020-07-04 ENCOUNTER — Other Ambulatory Visit (HOSPITAL_COMMUNITY): Payer: Self-pay | Admitting: Interventional Radiology

## 2020-07-04 DIAGNOSIS — I771 Stricture of artery: Secondary | ICD-10-CM

## 2020-07-04 DIAGNOSIS — I6601 Occlusion and stenosis of right middle cerebral artery: Secondary | ICD-10-CM | POA: Diagnosis not present

## 2020-07-04 HISTORY — PX: IR US GUIDE VASC ACCESS RIGHT: IMG2390

## 2020-07-04 HISTORY — PX: IR ANGIO VERTEBRAL SEL SUBCLAVIAN INNOMINATE BILAT MOD SED: IMG5366

## 2020-07-04 HISTORY — PX: IR ANGIO INTRA EXTRACRAN SEL COM CAROTID INNOMINATE BILAT MOD SED: IMG5360

## 2020-07-04 LAB — BASIC METABOLIC PANEL
Anion gap: 5 (ref 5–15)
BUN: 8 mg/dL (ref 6–20)
CO2: 26 mmol/L (ref 22–32)
Calcium: 8.8 mg/dL — ABNORMAL LOW (ref 8.9–10.3)
Chloride: 107 mmol/L (ref 98–111)
Creatinine, Ser: 0.85 mg/dL (ref 0.61–1.24)
GFR, Estimated: >60 mL/min (ref >60)
Glucose, Bld: 129 mg/dL — ABNORMAL HIGH (ref 70–99)
Potassium: 4.3 mmol/L (ref 3.5–5.1)
Sodium: 138 mmol/L (ref 135–145)

## 2020-07-04 LAB — CBC
HCT: 43.6 % (ref 39.0–52.0)
Hemoglobin: 15 g/dL (ref 13.0–17.0)
MCH: 32.2 pg (ref 26.0–34.0)
MCHC: 34.4 g/dL (ref 30.0–36.0)
MCV: 93.6 fL (ref 80.0–100.0)
Platelets: 206 10*3/uL (ref 150–400)
RBC: 4.66 MIL/uL (ref 4.22–5.81)
RDW: 12.1 % (ref 11.5–15.5)
WBC: 5.9 10*3/uL (ref 4.0–10.5)
nRBC: 0 % (ref 0.0–0.2)

## 2020-07-04 LAB — GLUCOSE, CAPILLARY
Glucose-Capillary: 134 mg/dL — ABNORMAL HIGH (ref 70–99)
Glucose-Capillary: 107 mg/dL — ABNORMAL HIGH (ref 70–99)

## 2020-07-04 LAB — PROTIME-INR
INR: 1 (ref 0.8–1.2)
Prothrombin Time: 13.5 seconds (ref 11.4–15.2)

## 2020-07-04 MED ORDER — HEPARIN SODIUM (PORCINE) 1000 UNIT/ML IJ SOLN
INTRAMUSCULAR | Status: AC | PRN
  Administered 2020-07-04: 2000 [IU] via INTRA_ARTERIAL

## 2020-07-04 MED ORDER — NITROGLYCERIN 1 MG/10 ML FOR IR/CATH LAB
INTRA_ARTERIAL | Status: AC
  Filled 2020-07-04: qty 10

## 2020-07-04 MED ORDER — VERAPAMIL HCL 2.5 MG/ML IV SOLN
INTRAVENOUS | Status: AC | PRN
  Administered 2020-07-04: 2.5 mg via INTRAVENOUS

## 2020-07-04 MED ORDER — SODIUM CHLORIDE 0.9 % IV SOLN: Freq: Once | INTRAVENOUS | Status: AC

## 2020-07-04 MED ORDER — FENTANYL CITRATE (PF) 100 MCG/2ML IJ SOLN
INTRAMUSCULAR | Status: AC
  Filled 2020-07-04: qty 2

## 2020-07-04 MED ORDER — HEPARIN SODIUM (PORCINE) 1000 UNIT/ML IJ SOLN
INTRAMUSCULAR | Status: AC
  Filled 2020-07-04: qty 1

## 2020-07-04 MED ORDER — SODIUM CHLORIDE 0.9 % IV SOLN: INTRAVENOUS | Status: AC

## 2020-07-04 MED ORDER — FENTANYL CITRATE (PF) 100 MCG/2ML IJ SOLN
INTRAMUSCULAR | Status: AC | PRN
  Administered 2020-07-04: 25 ug via INTRAVENOUS

## 2020-07-04 MED ORDER — MIDAZOLAM HCL 2 MG/2ML IJ SOLN
INTRAMUSCULAR | Status: AC | PRN
  Administered 2020-07-04: 1 mg via INTRAVENOUS

## 2020-07-04 MED ORDER — MIDAZOLAM HCL 2 MG/2ML IJ SOLN
INTRAMUSCULAR | Status: AC
  Filled 2020-07-04: qty 2

## 2020-07-04 MED ORDER — VERAPAMIL HCL 2.5 MG/ML IV SOLN
INTRAVENOUS | Status: AC
  Filled 2020-07-04: qty 2

## 2020-07-04 MED ORDER — LIDOCAINE HCL 1 % IJ SOLN
INTRAMUSCULAR | Status: AC
  Filled 2020-07-04: qty 20

## 2020-07-04 MED ORDER — NITROGLYCERIN 1 MG/10 ML FOR IR/CATH LAB
INTRA_ARTERIAL | Status: AC | PRN
  Administered 2020-07-04: 200 ug via INTRA_ARTERIAL

## 2020-07-04 MED ORDER — IOHEXOL 300 MG/ML  SOLN
150.0000 mL | Freq: Once | INTRAMUSCULAR | Status: AC | PRN
  Administered 2020-07-04: 75 mL

## 2020-07-04 NOTE — H&P (Signed)
Chief Complaint: Stent evaluation. Request is for cerebral angiogram  Referring Physician(s): Dr. Royal Hawthorn  Supervising Physician: Luanne Bras  Patient Status: [facility] - Out-pt  History of Present Illness: Scott Day is a 60 y.o. male  outpatient. Smoker. History of HTN, GERD, DM,  colon cancer, right M1/middle cerebral artery occlusion with revsasculation and stent placement  across the dissection flap  on 3.15.21. Residual cognitive impairment and mood instability, left sided weakness, dysarthria, headaches and occasional swallowing difficulty. Most recent angiogram performed on 10.20.22 shows 50-60% intra stent stenosis of the right middle cerebral artery.  Patient presents for cerebral angiogram and stent evaluation. Patient is on Brilinta and ASA 325 mg (not listed on his medication list). Patient presents for cerebral angiogram.    Currently without any significant complaints. Patient alert and laying in bed, calm and comfortable. Denies any fevers, headache, chest pain, SOB, cough, abdominal pain, nausea, vomiting or bleeding. Return precautions and treatment recommendations and follow-up discussed with the patient who is agreeable with the plan.    Past Medical History:  Diagnosis Date  . Cancer [facility]) 2012   colon cancer  . Closed fracture of left distal radius   . Colon cancer ([place])   . 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    . Diabetes ([place])   . GERD (gastroesophageal reflux disease)   . Hypertension     Past Surgical History:  Procedure Laterality Date  . BUBBLE STUDY  06/08/2019   Procedure: BUBBLE STUDY;  Surgeon: Buford Dresser, MD;  Location: [facility] ENDOSCOPY;  Service: Cardiovascular;;  . COLON SURGERY  2004   colon cancer  . COLONOSCOPY  2011   Dr. Fuller Plan: Evidence of right hemicolectomy, polyps removed from the rectum, hyperplastic  . IR ANGIO INTRA  EXTRACRAN SEL COM CAROTID INNOMINATE BILAT MOD SED  01/08/2020  . IR ANGIO VERTEBRAL SEL SUBCLAVIAN INNOMINATE BILAT MOD SED  01/08/2020  . IR CT HEAD LTD  06/04/2019  . IR CT HEAD LTD  06/04/2019  . IR INTRA CRAN STENT  06/04/2019  . IR PERCUTANEOUS ART THROMBECTOMY/INFUSION INTRACRANIAL INC DIAG ANGIO  06/04/2019  . IR US GUIDE VASC ACCESS RIGHT  06/04/2019  . OPEN REDUCTION INTERNAL FIXATION (ORIF) DISTAL RADIAL FRACTURE Left 09/20/2017   Procedure: OPEN REDUCTION INTERNAL FIXATION (ORIF) DISTAL RADIAL FRACTURE;  Surgeon: Leanora Cover, MD;  Location: [place];  Service: Orthopedics;  Laterality: Left;  . RADIOLOGY WITH ANESTHESIA N/A 06/04/2019   Procedure: IR WITH ANESTHESIA;  Surgeon: Radiologist, Medication, MD;  Location: [place];  Service: Radiology;  Laterality: N/A;  . TEE WITHOUT CARDIOVERSION N/A 06/08/2019   Procedure: TRANSESOPHAGEAL ECHOCARDIOGRAM (TEE);  Surgeon: Buford Dresser, MD;  Location: [facility] ENDOSCOPY;  Service: Cardiovascular;  Laterality: N/A;    Allergies: Patient has no known allergies.  Medications: Prior to Admission medications   Medication Sig Start Date End Date Taking? Authorizing Provider  acetaminophen (TYLENOL) 500 MG tablet Take 1,000 mg by mouth every 6 (six) hours as needed for moderate pain or headache.   Yes [provider]  aspirin 81 MG chewable tablet Chew 1 tablet (81 mg total) by mouth daily. 06/10/19  Yes Rinehuls, Early Chars, PA-C  divalproex (DEPAKOTE ER) 250 MG 24 hr tablet Take 1 tablet (250 mg total) by mouth daily. 06/12/20  Yes Jamse Arn, MD  FLUoxetine (PROZAC) 20 MG capsule Take 1 capsule (20 mg total) by mouth daily.  10/24/19  Yes McCue, Janett Billow, NP  ibuprofen (ADVIL) 200 MG tablet Take 400 mg by mouth every 6 (six) hours as needed for headache or moderate pain.   Yes [provider]  lisinopril (ZESTRIL) 2.5 MG tablet Take 1 tablet (2.5 mg total) by mouth daily. 04/29/20  Yes Charlott Rakes, MD   metFORMIN (GLUCOPHAGE) 500 MG tablet Take 0.5 tablets (250 mg total) by mouth daily with breakfast. 04/29/20  Yes Charlott Rakes, MD  pantoprazole (PROTONIX) 40 MG tablet Take 1 tablet (40 mg total) by mouth daily before breakfast. 04/29/20  Yes Mahala Menghini, PA-C  QUEtiapine (SEROQUEL) 25 MG tablet Take 1 tablet (25 mg total) by mouth at bedtime as needed (for sleep). 05/06/20  Yes McCue, Janett Billow, NP  rosuvastatin (CRESTOR) 20 MG tablet Take 1 tablet (20 mg total) by mouth daily. Patient taking differently: Take 20 mg by mouth every evening. 07/23/19  Yes McCue, Janett Billow, NP  ticagrelor (BRILINTA) 90 MG TABS tablet Take 1 tablet (90 mg total) by mouth 2 (two) times daily. 06/14/19  Yes Angiulli, Lavon Paganini, PA-C  Accu-Chek Softclix Lancets lancets Use as instructed 10/03/19   Charlott Rakes, MD  Blood Glucose Monitoring Suppl (ACCU-CHEK AVIVA CONNECT) w/Device KIT Test blood sugar 3 times daily 10/02/19   Charlott Rakes, MD  buPROPion (WELLBUTRIN XL) 150 MG 24 hr tablet Take 1 tablet (150 mg total) by mouth daily. Patient not taking: Reported on 07/01/2020 04/29/20   Charlott Rakes, MD  glucose blood (ACCU-CHEK AVIVA PLUS) test strip Use as instructed 10/02/19   Charlott Rakes, MD  Lancets Misc. (ACCU-CHEK SOFTCLIX LANCET DEV) KIT Test blood sugar 3 times daily 10/02/19   Charlott Rakes, MD  Misc. Devices MISC CPAP with AutoPap 8-15.  Diagnosis obstructive sleep apnea. 05/05/20   Charlott Rakes, MD     Family History  Problem Relation Age of Onset  . Cancer Mother        primary unknown  . Colon cancer Neg Hx     Social History   Socioeconomic History  . Marital status: Divorced    Spouse name: Not on file  . Number of children: Not on file  . Years of education: Not on file  . Highest education level: Not on file  Occupational History  . Not on file  Tobacco Use  . Smoking status: Current Every Day Smoker    Packs/day: 1.00    Start date: 07/20/2019  . Smokeless tobacco: Never Used  .  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 Vaping Use: Former  Substance and Sexual Activity  . Alcohol use: Yes    Alcohol/week: 24.0 standard drinks    Types: 24 Cans of beer per week    Comment: 12 to 24 beers daily.  As of 04/29/2020 patient reports drinking 3 to 4 days/week.  Between 3-greater than 12 beers at a time.  . Drug use: Never  . Sexual activity: Not on file  Other Topics Concern  . Not on file  Social History Narrative  . Not on file   Social Determinants of Health   Financial Resource Strain: Not on file  Food Insecurity: Not on file  Transportation Needs: Not on file  Physical Activity: Not on file  Stress: Not on file  Social Connections: Not on file     Review of Systems: A 12 point ROS discussed and pertinent positives are indicated in the HPI above.  All other systems are negative.  Review of Systems  Constitutional: Negative for fever.  HENT: Negative for congestion.   Respiratory: Negative for cough and shortness of breath.   Cardiovascular: Negative for chest pain.  Gastrointestinal: Negative for abdominal pain.  Neurological: Negative for headaches.  Psychiatric/Behavioral: Negative for behavioral problems and confusion.    Vital Signs: BP 130/84   Temp 97.6 F (36.4 C) (Oral)   Ht 5' 10" (1.778 m)   Wt 234 lb (106.1 kg)   SpO2 98%   BMI 33.58 kg/m   Physical Exam Vitals and nursing note reviewed.  Constitutional:      Appearance: He is well-developed.  HENT:     Head: Normocephalic.  Eyes:     Comments: Conjunctiva noted to be red.  Cardiovascular:     Rate and Rhythm: Normal rate and regular rhythm.     Heart sounds: Normal heart sounds.  Pulmonary:     Effort: Pulmonary effort is normal.     Breath sounds: Normal breath sounds.  Musculoskeletal:        General: Normal range of motion.     Cervical back: Normal range of motion.  Skin:    General: Skin is dry.     Comments: Multiple bruising  and scabs noted to upper extremities.   Neurological:     Mental Status: He is alert and oriented to person, place, and time.     Motor: Weakness ( left arm weakness.) present.     Comments: Alert, aware and oriented X 3 Speech and comprehension is intact.  PERRL bilaterally Mild left sided droop noted Tongue midline Can spontaneously move all 4 extremities. Hand grip strength right greater than left  Fine motor and coordination slow but intact.   Dysarthria. Speech, cognition and language  are generally intact.  Comprehension and fluency are normal.  Judgment and insight norma/  Fine motor and coordinationgrossly in tact Gaitnot assessed Rombergnot assessed Heel to toenot assessed Distal pulsesnot assessed      Imaging: CUP PACEART REMOTE DEVICE CHECK  Result Date: 06/14/2020 ILR summary report received. Battery status OK. Normal device function. No new symptom, tachy, brady, or pause episodes. No new AF episodes. Monthly summary reports and ROV/PRN.  R. Powers, CVRS   Labs:  CBC: Recent Labs    01/08/20 0715  WBC 7.2  HGB 15.6  HCT 45.8  PLT 198    COAGS: Recent Labs    01/08/20 0715  INR 1.0  APTT 28    BMP: Recent Labs    09/10/19 1045 01/08/20 0715 01/18/20 1609 05/06/20 0939  NA 140 139 139 141  K 4.4 3.9 3.9 4.7  CL 103 103 103 103  CO2 _0 GLUCOSE 106* 134* 125* 131*  BUN _1 CALCIUM 9.5 9.6 9.5 9.4  CREATININE 1.00 0.87 0.85 0.98  GFRNONAA 82 >60 95 83  GFRAA 95  --  110 96    LIVER FUNCTION TESTS: Recent Labs    09/10/19 1045 01/18/20 1609 05/06/20 0939  BILITOT 0.5 0.2 0.3  AST _2 ALT 26 34 40  ALKPHOS 87 86 84  PROT 6.9 7.1 7.0  ALBUMIN 4.6 4.5 4.4     Assessment and Plan:  60 y.o. male outpatient. Smoker. History of HTN, GERD, DM,  colon cancer, right M1/middle cerebral artery occlusion with revsasculation and stent placement  across the dissection flap on 3.15.21. Residual cognitive  impairment and mood instability, left sided weakness, dysarthria, headaches and occasional swallowing  difficulty Most recent angiogram performed on 10.20.22 shows 50-60% intra stent stenosis of the right middle cerebral artery.  Patient presents for cerebral angiogram and stent evaluation.   Patient is on Brilinta and ASA 325 mg (not listed on his medication list).  All labs are within acceptable parameters. NKDA. Patient has been NPO since midnight.  Risks and benefits of cerebral angiogram were discussed with the patient including, but not limited to bleeding, infection, vascular injury or contrast induced renal failure.  This interventional procedure involves the use of X-rays and because of the nature of the planned procedure, it is possible that we will have prolonged use of X-ray fluoroscopy.  Potential radiation risks to you include (but are not limited to) the following: - A slightly elevated risk for cancer  several years later in life. This risk is typically less than 0.5% percent. This risk is low in comparison to the normal incidence of human cancer, which is 33% for women and 50% for men according to the [place]. - Radiation induced injury can include skin redness, resembling a rash, tissue breakdown / ulcers and hair loss (which can be temporary or permanent).   The likelihood of either of these occurring depends on the difficulty of the procedure and whether you are sensitive to radiation due to previous procedures, disease, or genetic conditions.   IF your procedure requires a prolonged use of radiation, you will be notified and given written instructions for further action.  It is your responsibility to monitor the irradiated area for the 2 weeks following the procedure and to notify your physician if you are concerned that you have suffered a radiation induced injury.    All of the patient's questions were answered, patient is agreeable to proceed.  Consent  signed and in chart.    Thank you for this interesting consult.  I greatly enjoyed meeting Scott Day and look forward to participating in their care.  A copy of this report was sent to the requesting provider on this date.  Electronically Signed: Jacqualine Mau, NP 07/04/2020, 7:15 AM   I spent a total of  30 Minutes   in face to face in clinical consultation, greater than 50% of which was counseling/coordinating care for cerebral angiogram

## 2020-07-04 NOTE — Sedation Documentation (Signed)
Right radial sheath removed. TR band applied at 0935 with 10cc of air.

## 2020-07-04 NOTE — Procedures (Signed)
S/P 4 vessel; artrriogram. RT Rad approacvh. Findings. 1.Approx 50 %  intrastent  RT MCA stenosis . 2retrotonsillar abscess VA end in RT PICA S.Lynix Bonine MD

## 2020-07-04 NOTE — Discharge Instructions (Signed)
Radial Site Care  This sheet gives you information about how to care for yourself after your procedure. Your health care provider may also give you more specific instructions. If you have problems or questions, contact your health care provider. What can I expect after the procedure? After the procedure, it is common to have:  Bruising and tenderness at the catheter insertion area. Follow these instructions at home: Medicines  Take over-the-counter and prescription medicines only as told by your health care provider. Insertion site care  Follow instructions from your health care provider about how to take care of your insertion site. Make sure you: ? Wash your hands with soap and water before you change your bandage (dressing). If soap and water are not available, use hand sanitizer. ? Change your dressing as told by your health care provider. ? Leave stitches (sutures), skin glue, or adhesive strips in place. These skin closures may need to stay in place for 2 weeks or longer. If adhesive strip edges start to loosen and curl up, you may trim the loose edges. Do not remove adhesive strips completely unless your health care provider tells you to do that.  Check your insertion site every day for signs of infection. Check for: ? Redness, swelling, or pain. ? Fluid or blood. ? Pus or a bad smell. ? Warmth.  Do not take baths, swim, or use a hot tub until your health care provider approves.  You may shower 24-48 hours after the procedure, or as directed by your health care provider. ? Remove the dressing and gently wash the site with plain soap and water. ? Pat the area dry with a clean towel. ? Do not rub the site. That could cause bleeding.  Do not apply powder or lotion to the site. Activity  For 24 hours after the procedure, or as directed by your health care provider: ? Do not flex or bend the affected arm. ? Do not push or pull heavy objects with the affected arm. ? Do not drive  yourself home from the hospital or clinic. You may drive 24 hours after the procedure unless your health care provider tells you not to. ? Do not operate machinery or power tools.  Do not lift anything that is heavier than 10 lb (4.5 kg), or the limit that you are told, until your health care provider says that it is safe.  Ask your health care provider when it is okay to: ? Return to work or school. ? Resume usual physical activities or sports. ? Resume sexual activity.   General instructions  If the catheter site starts to bleed, raise your arm and put firm pressure on the site. If the bleeding does not stop, get help right away. This is a medical emergency.  If you went home on the same day as your procedure, a responsible adult should be with you for the first 24 hours after you arrive home.  Keep all follow-up visits as told by your health care provider. This is important. Contact a health care provider if:  You have a fever.  You have redness, swelling, or yellow drainage around your insertion site. Get help right away if:  You have unusual pain at the radial site.  The catheter insertion area swells very fast.  The insertion area is bleeding, and the bleeding does not stop when you hold steady pressure on the area.  Your arm or hand becomes pale, cool, tingly, or numb. These symptoms may represent a serious   problem that is an emergency. Do not wait to see if the symptoms will go away. Get medical help right away. Call your local emergency services (911 in the [place] Do not drive yourself to the hospital. Summary  After the procedure, it is common to have bruising and tenderness at the site.  Follow instructions from your health care provider about how to take care of your radial site wound. Check the wound every day for signs of infection.  Do not lift anything that is heavier than 10 lb (4.5 kg), or the limit that you are told, until your health care provider says that it  is safe. This information is not intended to replace advice given to you by your health care provider. Make sure you discuss any questions you have with your health care provider. Document Revised: 04/13/2017 Document Reviewed: 04/13/2017 Elsevier Patient Education  2021 [employer]  

## 2020-07-04 NOTE — Sedation Documentation (Signed)
Nasal canula/etco2 removed per Dr. Estanislado Pandy request.

## 2020-07-14 ENCOUNTER — Ambulatory Visit (INDEPENDENT_AMBULATORY_CARE_PROVIDER_SITE_OTHER): Payer: Medicaid Other

## 2020-07-14 DIAGNOSIS — I63311 Cerebral infarction due to thrombosis of right middle cerebral artery: Secondary | ICD-10-CM

## 2020-07-15 LAB — CUP PACEART REMOTE DEVICE CHECK
Date Time Interrogation Session: 20220422214918
Implantable Pulse Generator Implant Date: 20210924

## 2020-07-28 ENCOUNTER — Ambulatory Visit: Payer: Medicaid Other | Admitting: Family Medicine

## 2020-07-30 NOTE — Addendum Note (Signed)
Addended by: Douglass Rivers D on: 07/30/2020 01:56 PM   Modules accepted: Level of Service

## 2020-07-30 NOTE — Progress Notes (Signed)
Carelink Summary Report / Loop Recorder 

## 2020-08-14 LAB — CUP PACEART REMOTE DEVICE CHECK
Date Time Interrogation Session: 20220525215029
Implantable Pulse Generator Implant Date: 20210924

## 2020-08-19 ENCOUNTER — Encounter: Payer: Self-pay | Admitting: Physical Medicine & Rehabilitation

## 2020-08-19 ENCOUNTER — Encounter: Payer: Medicaid Other | Attending: Physical Medicine & Rehabilitation | Admitting: Physical Medicine & Rehabilitation

## 2020-08-19 ENCOUNTER — Other Ambulatory Visit: Payer: Self-pay

## 2020-08-19 ENCOUNTER — Ambulatory Visit (INDEPENDENT_AMBULATORY_CARE_PROVIDER_SITE_OTHER): Payer: Medicaid Other

## 2020-08-19 VITALS — BP 134/87 | HR 79 | Temp 98.1°F | Ht 70.0 in | Wt 241.2 lb

## 2020-08-19 DIAGNOSIS — I69319 Unspecified symptoms and signs involving cognitive functions following cerebral infarction: Secondary | ICD-10-CM | POA: Diagnosis present

## 2020-08-19 DIAGNOSIS — F0391 Unspecified dementia with behavioral disturbance: Secondary | ICD-10-CM | POA: Diagnosis not present

## 2020-08-19 DIAGNOSIS — I639 Cerebral infarction, unspecified: Secondary | ICD-10-CM

## 2020-08-19 DIAGNOSIS — I69354 Hemiplegia and hemiparesis following cerebral infarction affecting left non-dominant side: Secondary | ICD-10-CM | POA: Insufficient documentation

## 2020-08-19 DIAGNOSIS — R454 Irritability and anger: Secondary | ICD-10-CM | POA: Diagnosis not present

## 2020-08-19 DIAGNOSIS — Z720 Tobacco use: Secondary | ICD-10-CM | POA: Diagnosis not present

## 2020-08-19 MED ORDER — DIVALPROEX SODIUM ER 500 MG PO TB24: 500.0000 mg | ORAL_TABLET | Freq: Every day | ORAL | 3 refills | Status: AC

## 2020-08-19 NOTE — Progress Notes (Addendum)
Subjective:    Patient ID: Scott Day, male    DOB: 07/27/1960, 60 y.o.   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    Last clinic visit on 06/17/2020.  Significant other supplements history.  Since that time, patient states  BP is controlled. He is wearing his CPAP, notes benefit. He continues to await SSD. He is taking Depakote without issues. He is smoking 1 PPD. Per significant other, he is more angry and needs to get back to Neuropsych.   Pain Inventory Average Pain 0 Pain Right Now 0 My pain is no pain today  LOCATION OF PAIN  Head  BOWEL Number of stools per week: 7 Oral laxative use No  Type of laxative none Enema or suppository use No  History of colostomy No  Incontinent No   BLADDER Normal  Mobility walk without assistance how many minutes can you walk? 20 ability to climb steps?  yes do you drive?  yes  Function not employed: date last employed . I need assistance with the following:  meal prep, household duties and shopping  Neuro/Psych weakness depression anxiety  Prior Studies Any changes since last visit?  no  Physicians involved in your care Any changes since last visit?  no   Family History  Problem Relation Age of Onset  . Cancer Mother        primary unknown  . Colon cancer Neg Hx    Social History   Socioeconomic History  . Marital status: Divorced    Spouse name: Not on file  . Number of children: Not on file  . Years of education: Not on file  . Highest education level: Not on file  Occupational History  . Not on file  Tobacco Use  . Smoking status: Current Every Day Smoker    Packs/day: 1.00    Start date: 07/20/2019  . Smokeless tobacco: Never Used  . 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 Vaping Use: Former  Substance  and Sexual Activity  . Alcohol use: Yes    Alcohol/week: 24.0 standard drinks    Types: 24 Cans of beer per week    Comment: 12 to 24 beers daily.  As of 04/29/2020 patient reports drinking 3 to 4 days/week.  Between 3-greater than 12 beers at a time.  . Drug use: Never  . Sexual activity: Not on file  Other Topics Concern  . Not on file  Social History Narrative  . Not on file   Social Determinants of Health   Financial Resource Strain: Not on file  Food Insecurity: Not on file  Transportation Needs: Not on file  Physical Activity: Not on file  Stress: Not on file  Social Connections: Not on file   Past Surgical History:  Procedure Laterality Date  . BUBBLE STUDY  06/08/2019   Procedure: BUBBLE STUDY;  Surgeon: Buford Dresser, MD;  Location: [facility] ENDOSCOPY;  Service: Cardiovascular;;  . COLON SURGERY  2004   colon cancer  . COLONOSCOPY  2011   Dr. Fuller Plan: Evidence of right hemicolectomy, polyps removed from the rectum, hyperplastic  . IR ANGIO INTRA EXTRACRAN SEL COM CAROTID INNOMINATE BILAT MOD SED  01/08/2020  . IR ANGIO INTRA EXTRACRAN SEL COM CAROTID INNOMINATE BILAT MOD SED  07/04/2020  . IR ANGIO VERTEBRAL SEL SUBCLAVIAN INNOMINATE  BILAT MOD SED  01/08/2020  . IR ANGIO VERTEBRAL SEL SUBCLAVIAN INNOMINATE BILAT MOD SED  07/04/2020  . IR CT HEAD LTD  06/04/2019  . IR CT HEAD LTD  06/04/2019  . IR INTRA CRAN STENT  06/04/2019  . IR PERCUTANEOUS ART THROMBECTOMY/INFUSION INTRACRANIAL INC DIAG ANGIO  06/04/2019  . IR US GUIDE VASC ACCESS RIGHT  06/04/2019  . IR US GUIDE VASC ACCESS RIGHT  07/04/2020  . OPEN REDUCTION INTERNAL FIXATION (ORIF) DISTAL RADIAL FRACTURE Left 09/20/2017   Procedure: OPEN REDUCTION INTERNAL FIXATION (ORIF) DISTAL RADIAL FRACTURE;  Surgeon: Leanora Cover, MD;  Location: [place];  Service: Orthopedics;  Laterality: Left;  . RADIOLOGY WITH ANESTHESIA N/A 06/04/2019   Procedure: IR WITH ANESTHESIA;  Surgeon: Radiologist, Medication, MD;   Location: [place];  Service: Radiology;  Laterality: N/A;  . TEE WITHOUT CARDIOVERSION N/A 06/08/2019   Procedure: TRANSESOPHAGEAL ECHOCARDIOGRAM (TEE);  Surgeon: Buford Dresser, MD;  Location: [facility] ENDOSCOPY;  Service: Cardiovascular;  Laterality: N/A;   Past Medical History:  Diagnosis Date  . Cancer [facility]) 2012   colon cancer  . Closed fracture of left distal radius   . Colon cancer ([place])   . 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    . Diabetes ([place])   . GERD (gastroesophageal reflux disease)   . Hypertension    BP 134/87   Pulse 79   Temp 98.1 F (36.7 C)   Ht 5\' 10"  (1.778 m)   Wt 241 lb 3.2 oz (109.4 kg)   SpO2 96%   BMI 34.61 kg/m   Opioid Risk Score:   Fall Risk Score:  `1  Depression screen PHQ 2/9  Depression screen [facility] 2/9 08/19/2020 05/06/2020 04/29/2020 04/14/2020 02/26/2020 01/18/2020 11/27/2019  Decreased Interest 1 1 1 1 1  0 0  Down, Depressed, Hopeless 1 1 1 1 1  0 0  PHQ - 2 Score 2 2 2 2 2  0 0  Altered sleeping - - 2 - - - -  Tired, decreased energy - - 2 - - - -  Change in appetite - - 1 - - - -  Feeling bad or failure about yourself  - - 0 - - - -  Trouble concentrating - - 1 - - - -  Moving slowly or fidgety/restless - - 0 - - - -  Suicidal thoughts - - 0 - - - -  PHQ-9 Score - - 8 - - - -  Difficult doing work/chores - - - - - - -   Review of Systems  Constitutional: Negative.   HENT: Negative.   Eyes: Negative.   Respiratory: Negative.   Cardiovascular: Negative.   Gastrointestinal: Negative.   Endocrine: Negative.   Genitourinary: Negative.   Musculoskeletal: Negative.   Skin: Negative.   Allergic/Immunologic: Negative.   Neurological: Positive for dizziness and weakness.  Hematological: Bruises/bleeds easily.       Brilinta   Psychiatric/Behavioral: Positive for agitation, dysphoric mood and sleep disturbance. The patient is nervous/anxious.    All other systems reviewed and are negative.     Objective:   Physical Exam   Constitutional: No distress . Vital signs reviewed. HENT: Normocephalic.  Atraumatic. Eyes: EOMI. No discharge. Cardiovascular: No JVD.   Respiratory: Normal effort.  No stridor.   GI: Non-distended.   Skin: Warm and dry.  Intact. Psych: Normal mood.  Normal behavior. Musc: No edema in extremities.  No  tenderness in extremities. 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     1.  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   Continue to follow up with Neurology             Released from therapies per patient, continue HEP             Loop in place  Driving locally at present  Attempting to apply for SSD, continues to await  2.  Tobacco abuse/Etoh abuse             Encouraged abstinence again, educated on smoking cessation again, continues to smoke 1 PPD, some improvement             Following up with PCP for med changes   3. Mood instability - exacerbated by stressors with anger  Follow up with Neuropsych             Follow up on med recs             Continue to monitor             Continue CBT  Will increase Depakote 500 ER  Seroquel changed to prn by Neuro   5. OSA  Continues CPAP

## 2020-08-20 NOTE — Pre-Procedure Instructions (Signed)
Interoffice message sent to Dr Gala Romney for evaluation to makes sure it is okay to proceed with procedure due to recent medical problems.

## 2020-08-20 NOTE — Patient Instructions (Addendum)
Scott Day  08/20/2020     @PREFPERIOPPHARMACY @   Your procedure is scheduled on  08/25/2020.   Report to [facility] at  [phone]  A.M.   Call this number if you have problems the morning of surgery:  [phone]   Remember:  Follow the diet and prep instructions given to you by the office.                       Take these medicines the morning of surgery with A SIP OF WATER  Depakote, prozac, protonix.  DO NOT take any medications for diabetes the morning of your procedure.      Please brush your teeth.  Do not wear jewelry, make-up or nail polish.  Do not wear lotions, powders, or perfumes, or deodorant.  Do not shave 48 hours prior to surgery.  Men may shave face and neck.  Do not bring valuables to the hospital.  [facility] is not responsible for any belongings or valuables.  Contacts, dentures or bridgework may not be worn into surgery.  Leave your suitcase in the car.  After surgery it may be brought to your room.  For patients admitted to the hospital, discharge time will be determined by your treatment team.  Patients discharged the day of surgery will not be allowed to drive home and must have someone with them for 24 hours.    Special instructions:  DO NOT smoke tobacco or vape for 24 hours before your procedure.  Please read over the following fact sheets that you were given. Anesthesia Post-op Instructions and Care and Recovery After Surgery       Upper Endoscopy, Adult, Care After This sheet gives you information about how to care for yourself after your procedure. Your health care provider may also give you more specific instructions. If you have problems or questions, contact your health care provider. What can I expect after the procedure? After the procedure, it is common to have:  A sore throat.  Mild stomach pain or discomfort.  Bloating.  Nausea. Follow these instructions at home:  Follow instructions from your health  care provider about what to eat or drink after your procedure.  Return to your normal activities as told by your health care provider. Ask your health care provider what activities are safe for you.  Take over-the-counter and prescription medicines only as told by your health care provider.  If you were given a sedative during the procedure, it can affect you for several hours. Do not drive or operate machinery until your health care provider says that it is safe.  Keep all follow-up visits as told by your health care provider. This is important.   Contact a health care provider if you have:  A sore throat that lasts longer than one day.  Trouble swallowing. Get help right away if:  You vomit blood or your vomit looks like coffee grounds.  You have: ? A fever. ? Bloody, black, or tarry stools. ? A severe sore throat or you cannot swallow. ? Difficulty breathing. ? Severe pain in your chest or abdomen. Summary  After the procedure, it is common to have a sore throat, mild stomach discomfort, bloating, and nausea.  If you were given a sedative during the procedure, it can affect you for several hours. Do not drive or operate machinery until your health care provider says that it is safe.  Follow instructions from your health care provider about what to eat or drink after your procedure.  Return to your normal activities as told by your health care provider. This information is not intended to replace advice given to you by your health care provider. Make sure you discuss any questions you have with your health care provider. Document Revised: 03/06/2019 Document Reviewed: 08/08/2017 Elsevier Patient Education  2021 [place].  Colonoscopy, Adult, Care After This sheet gives you information about how to care for yourself after your procedure. Your health care provider may also give you more specific instructions. If you have problems or questions, contact your health care  provider. What can I expect after the procedure? After the procedure, it is common to have:  A small amount of blood in your stool for 24 hours after the procedure.  Some gas.  Mild cramping or bloating of your abdomen. Follow these instructions at home: Eating and drinking  Drink enough fluid to keep your urine pale yellow.  Follow instructions from your health care provider about eating or drinking restrictions.  Resume your normal diet as instructed by your health care provider. Avoid heavy or fried foods that are hard to digest.   Activity  Rest as told by your health care provider.  Avoid sitting for a long time without moving. Get up to take short walks every 1-2 hours. This is important to improve blood flow and breathing. Ask for help if you feel weak or unsteady.  Return to your normal activities as told by your health care provider. Ask your health care provider what activities are safe for you. Managing cramping and bloating  Try walking around when you have cramps or feel bloated.  Apply heat to your abdomen as told by your health care provider. Use the heat source that your health care provider recommends, such as a moist heat pack or a heating pad. ? Place a towel between your skin and the heat source. ? Leave the heat on for 20-30 minutes. ? Remove the heat if your skin turns bright red. This is especially important if you are unable to feel pain, heat, or cold. You may have a greater risk of getting burned.   General instructions  If you were given a sedative during the procedure, it can affect you for several hours. Do not drive or operate machinery until your health care provider says that it is safe.  For the first 24 hours after the procedure: ? Do not sign important documents. ? Do not drink alcohol. ? Do your regular daily activities at a slower pace than normal. ? Eat soft foods that are easy to digest.  Take over-the-counter and prescription medicines  only as told by your health care provider.  Keep all follow-up visits as told by your health care provider. This is important. Contact a health care provider if:  You have blood in your stool 2-3 days after the procedure. Get help right away if you have:  More than a small spotting of blood in your stool.  Large blood clots in your stool.  Swelling of your abdomen.  Nausea or vomiting.  A fever.  Increasing pain in your abdomen that is not relieved with medicine. Summary  After the procedure, it is common to have a small amount of blood in your stool. You may also have mild cramping and bloating of your abdomen.  If you were given a sedative during the procedure, it can affect you for several hours.  Do not drive or operate machinery until your health care provider says that it is safe.  Get help right away if you have a lot of blood in your stool, nausea or vomiting, a fever, or increased pain in your abdomen. This information is not intended to replace advice given to you by your health care provider. Make sure you discuss any questions you have with your health care provider. Document Revised: 03/02/2019 Document Reviewed: 10/02/2018 Elsevier Patient Education  2021 [place] After This sheet gives you information about how to care for yourself after your procedure. Your health care provider may also give you more specific instructions. If you have problems or questions, contact your health care provider. What can I expect after the procedure? After the procedure, it is common to have:  Tiredness.  Forgetfulness about what happened after the procedure.  Impaired judgment for important decisions.  Nausea or vomiting.  Some difficulty with balance. Follow these instructions at home: For the time period you were told by your health care provider:  Rest as needed.  Do not participate in activities where you could fall or become  injured.  Do not drive or use machinery.  Do not drink alcohol.  Do not take sleeping pills or medicines that cause drowsiness.  Do not make important decisions or sign legal documents.  Do not take care of children on your own.      Eating and drinking  Follow the diet that is recommended by your health care provider.  Drink enough fluid to keep your urine pale yellow.  If you vomit: ? Drink water, juice, or soup when you can drink without vomiting. ? Make sure you have little or no nausea before eating solid foods. General instructions  Have a responsible adult stay with you for the time you are told. It is important to have someone help care for you until you are awake and alert.  Take over-the-counter and prescription medicines only as told by your health care provider.  If you have sleep apnea, surgery and certain medicines can increase your risk for breathing problems. Follow instructions from your health care provider about wearing your sleep device: ? Anytime you are sleeping, including during daytime naps. ? While taking prescription pain medicines, sleeping medicines, or medicines that make you drowsy.  Avoid smoking.  Keep all follow-up visits as told by your health care provider. This is important. Contact a health care provider if:  You keep feeling nauseous or you keep vomiting.  You feel light-headed.  You are still sleepy or having trouble with balance after 24 hours.  You develop a rash.  You have a fever.  You have redness or swelling around the IV site. Get help right away if:  You have trouble breathing.  You have new-onset confusion at home. Summary  For several hours after your procedure, you may feel tired. You may also be forgetful and have poor judgment.  Have a responsible adult stay with you for the time you are told. It is important to have someone help care for you until you are awake and alert.  Rest as told. Do not drive or  operate machinery. Do not drink alcohol or take sleeping pills.  Get help right away if you have trouble breathing, or if you suddenly become confused. This information is not intended to replace advice given to you by your health care provider. Make sure you discuss any questions you have with your  health care provider. Document Revised: 11/22/2019 Document Reviewed: 02/08/2019 Elsevier Patient Education  2021 [place] Scott Day  08/20/2020     @PREFPERIOPPHARMACY @   Your procedure is scheduled on  08/25/2020 .  [facility] at  [phone]  A.M.   Call this number if you have problems the morning of surgery:  [phone]   Remember:  Follow the diet and prep instructions given to you by the office.                    Take these medicines the morning of surgery with A SIP OF WATER  Depakote, prozac, protonix.  DO NOT take any medications for diabetes the morning of your procedure.     Please brush your teeth.  Do not wear jewelry, make-up or nail polish.  Do not wear lotions, powders, or perfumes, or deodorant.  Do not shave 48 hours prior to surgery.  Men may shave face and neck.  Do not bring valuables to the hospital.  [facility] is not responsible for any belongings or valuables.  Contacts, dentures or bridgework may not be worn into surgery.  Leave your suitcase in the car.  After surgery it may be brought to your room.  For patients admitted to the hospital, discharge time will be determined by your treatment team.  Patients discharged the day of surgery will not be allowed to drive home and must have someone with them for 24 hours.    Special instructions:  DO NOT smoke tobacco or vape for 24 hours before your procedure.  Please read over the following fact sheets that you were given. Anesthesia Post-op Instructions and Care and Recovery After Surgery       Upper Endoscopy, Adult, Care After This sheet gives you information  about how to care for yourself after your procedure. Your health care provider may also give you more specific instructions. If you have problems or questions, contact your health care provider. What can I expect after the procedure? After the procedure, it is common to have:  A sore throat.  Mild stomach pain or discomfort.  Bloating.  Nausea. Follow these instructions at home:  Follow instructions from your health care provider about what to eat or drink after your procedure.  Return to your normal activities as told by your health care provider. Ask your health care provider what activities are safe for you.  Take over-the-counter and prescription medicines only as told by your health care provider.  If you were given a sedative during the procedure, it can affect you for several hours. Do not drive or operate machinery until your health care provider says that it is safe.  Keep all follow-up visits as told by your health care provider. This is important.   Contact a health care provider if you have:  A sore throat that lasts longer than one day.  Trouble swallowing. Get help right away if:  You vomit blood or your vomit looks like coffee grounds.  You have: ? A fever. ? Bloody, black, or tarry stools. ? A severe sore throat or you cannot swallow. ? Difficulty breathing. ? Severe pain in your chest or abdomen. Summary  After the procedure, it is common to have a sore throat, mild stomach discomfort, bloating, and nausea.  If you were given a sedative during the procedure, it can affect you for several hours. Do not  drive or operate machinery until your health care provider says that it is safe.  Follow instructions from your health care provider about what to eat or drink after your procedure.  Return to your normal activities as told by your health care provider. This information is not intended to replace advice given to you by your health care provider. Make sure  you discuss any questions you have with your health care provider. Document Revised: 03/06/2019 Document Reviewed: 08/08/2017 Elsevier Patient Education  2021 [place].  Colonoscopy, Adult, Care After This sheet gives you information about how to care for yourself after your procedure. Your health care provider may also give you more specific instructions. If you have problems or questions, contact your health care provider. What can I expect after the procedure? After the procedure, it is common to have:  A small amount of blood in your stool for 24 hours after the procedure.  Some gas.  Mild cramping or bloating of your abdomen. Follow these instructions at home: Eating and drinking  Drink enough fluid to keep your urine pale yellow.  Follow instructions from your health care provider about eating or drinking restrictions.  Resume your normal diet as instructed by your health care provider. Avoid heavy or fried foods that are hard to digest.   Activity  Rest as told by your health care provider.  Avoid sitting for a long time without moving. Get up to take short walks every 1-2 hours. This is important to improve blood flow and breathing. Ask for help if you feel weak or unsteady.  Return to your normal activities as told by your health care provider. Ask your health care provider what activities are safe for you. Managing cramping and bloating  Try walking around when you have cramps or feel bloated.  Apply heat to your abdomen as told by your health care provider. Use the heat source that your health care provider recommends, such as a moist heat pack or a heating pad. ? Place a towel between your skin and the heat source. ? Leave the heat on for 20-30 minutes. ? Remove the heat if your skin turns bright red. This is especially important if you are unable to feel pain, heat, or cold. You may have a greater risk of getting burned.   General instructions  If you were  given a sedative during the procedure, it can affect you for several hours. Do not drive or operate machinery until your health care provider says that it is safe.  For the first 24 hours after the procedure: ? Do not sign important documents. ? Do not drink alcohol. ? Do your regular daily activities at a slower pace than normal. ? Eat soft foods that are easy to digest.  Take over-the-counter and prescription medicines only as told by your health care provider.  Keep all follow-up visits as told by your health care provider. This is important. Contact a health care provider if:  You have blood in your stool 2-3 days after the procedure. Get help right away if you have:  More than a small spotting of blood in your stool.  Large blood clots in your stool.  Swelling of your abdomen.  Nausea or vomiting.  A fever.  Increasing pain in your abdomen that is not relieved with medicine. Summary  After the procedure, it is common to have a small amount of blood in your stool. You may also have mild cramping and bloating of your abdomen.  If  you were given a sedative during the procedure, it can affect you for several hours. Do not drive or operate machinery until your health care provider says that it is safe.  Get help right away if you have a lot of blood in your stool, nausea or vomiting, a fever, or increased pain in your abdomen. This information is not intended to replace advice given to you by your health care provider. Make sure you discuss any questions you have with your health care provider. Document Revised: 03/02/2019 Document Reviewed: 10/02/2018 Elsevier Patient Education  2021 [place] After This sheet gives you information about how to care for yourself after your procedure. Your health care provider may also give you more specific instructions. If you have problems or questions, contact your health care provider. What can I  expect after the procedure? After the procedure, it is common to have:  Tiredness.  Forgetfulness about what happened after the procedure.  Impaired judgment for important decisions.  Nausea or vomiting.  Some difficulty with balance. Follow these instructions at home: For the time period you were told by your health care provider:  Rest as needed.  Do not participate in activities where you could fall or become injured.  Do not drive or use machinery.  Do not drink alcohol.  Do not take sleeping pills or medicines that cause drowsiness.  Do not make important decisions or sign legal documents.  Do not take care of children on your own.      Eating and drinking  Follow the diet that is recommended by your health care provider.  Drink enough fluid to keep your urine pale yellow.  If you vomit: ? Drink water, juice, or soup when you can drink without vomiting. ? Make sure you have little or no nausea before eating solid foods. General instructions  Have a responsible adult stay with you for the time you are told. It is important to have someone help care for you until you are awake and alert.  Take over-the-counter and prescription medicines only as told by your health care provider.  If you have sleep apnea, surgery and certain medicines can increase your risk for breathing problems. Follow instructions from your health care provider about wearing your sleep device: ? Anytime you are sleeping, including during daytime naps. ? While taking prescription pain medicines, sleeping medicines, or medicines that make you drowsy.  Avoid smoking.  Keep all follow-up visits as told by your health care provider. This is important. Contact a health care provider if:  You keep feeling nauseous or you keep vomiting.  You feel light-headed.  You are still sleepy or having trouble with balance after 24 hours.  You develop a rash.  You have a fever.  You have redness or  swelling around the IV site. Get help right away if:  You have trouble breathing.  You have new-onset confusion at home. Summary  For several hours after your procedure, you may feel tired. You may also be forgetful and have poor judgment.  Have a responsible adult stay with you for the time you are told. It is important to have someone help care for you until you are awake and alert.  Rest as told. Do not drive or operate machinery. Do not drink alcohol or take sleeping pills.  Get help right away if you have trouble breathing, or if you suddenly become confused. This information is not intended to replace advice given to you  by your health care provider. Make sure you discuss any questions you have with your health care provider. Document Revised: 11/22/2019 Document Reviewed: 02/08/2019 Elsevier Patient Education  2021 [employer].

## 2020-08-20 NOTE — Pre-Procedure Instructions (Signed)
patients last history Received: Today Rourk, [name], MD  Encarnacion Chu, RN; Mahala Menghini, PA-C; [place], Sydell Axon Glad somebody was looking at the chart. It is amazing what can happen between an office visit and day of procedure. 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 I think it would be best to cancel his colonoscopy - just have him circle back around to the office in the next month or so and reassess. Hopefully, we get them done by the end of the year.        Previous Messages   ----- Message -----  From: Encarnacion Chu, RN  Sent: 08/20/2020  9:57 AM EDT  To: Daneil Dolin, MD  Subject: evaluate patients last history          Good morning Dr Gala Romney! I was looking over [facility] Dessert chart for his PAT on tomorrow for 6/6 EGD/TCS. He was seen by Magda Paganini 04/29/2020 in the office. 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 It looks like he was having new left sided weakness along with some other symptoms. Will you evaluate this information and make sure you want to proceed right now? Thank you so much!

## 2020-08-21 ENCOUNTER — Telehealth: Payer: Self-pay | Admitting: *Deleted

## 2020-08-21 ENCOUNTER — Encounter (HOSPITAL_COMMUNITY): Payer: Self-pay

## 2020-08-21 ENCOUNTER — Encounter (HOSPITAL_COMMUNITY)
Admission: RE | Admit: 2020-08-21 | Discharge: 2020-08-21 | Disposition: A | Discharge status: Home / Self Care | Payer: Medicaid Other | Source: Ambulatory Visit | Attending: Internal Medicine | Admitting: Internal Medicine

## 2020-08-21 ENCOUNTER — Other Ambulatory Visit (HOSPITAL_COMMUNITY): Payer: Medicaid Other

## 2020-08-21 NOTE — Telephone Encounter (Signed)
Received VM from patient GF that they received a message that patient's appointment was cancelled. He was scheduled for pre-op appointment today for procedure on Monday with Dr. Gala Romney.  Spoke with Hoyle Sauer in endo. Dr. Gala Romney sent a message that patient needs to be cancelled and have him return to office. Rourk, [name], MD  Encarnacion Chu, RN; Mahala Menghini, PA-C; [place], Sydell Axon Glad somebody was looking at the chart. It is amazing what can happen between an office visit and day of procedure. 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 I think it would be best to cancel his colonoscopy - just have him circle back around to the office in the next month or so and reassess. Hopefully, we get them done by the end of the year.  ----------  Dr. Gala Romney, I spoke with pt spouse. She states the procedure he had done 06/2020 is standard procedure for dosing his brillinta. He is not having any new onset of weakness either. She states when they saw Magda Paganini in the office back in Feb 2022 she advised [place] of this as to why he was having that procedure done. She does not feel like we need to cancel the procedure. Please advise Dr. Gala Romney thanks

## 2020-08-21 NOTE — Telephone Encounter (Signed)
Please advise Scott Day thanks 

## 2020-08-21 NOTE — Telephone Encounter (Signed)
Neil Crouch saw patient last.  Ask Neil Crouch about this patient and reports we have since last office visit.

## 2020-08-22 ENCOUNTER — Encounter (HOSPITAL_COMMUNITY)
Admission: RE | Admit: 2020-08-22 | Discharge: 2020-08-22 | Disposition: A | Discharge status: Home / Self Care | Payer: Medicaid Other | Source: Ambulatory Visit | Attending: Internal Medicine | Admitting: Internal Medicine

## 2020-08-22 ENCOUNTER — Other Ambulatory Visit: Payer: Self-pay

## 2020-08-22 ENCOUNTER — Encounter (HOSPITAL_COMMUNITY): Payer: Self-pay

## 2020-08-22 HISTORY — DX: Depression, unspecified: F32.A

## 2020-08-22 HISTORY — DX: Bipolar disorder, unspecified: F31.9

## 2020-08-22 HISTORY — DX: Sleep apnea, unspecified: G47.30

## 2020-08-22 HISTORY — DX: Anxiety disorder, unspecified: F41.9

## 2020-08-22 NOTE — Pre-Procedure Instructions (Signed)
After further reviewing chart and speaking to patient, Dr Gala Romney calls and thinks that we should proceed with patients procedure. Scott Day OR scheduler notified and we put him back on schedule.

## 2020-08-22 NOTE — Pre-Procedure Instructions (Signed)
Attempted to call patient's girlfriend, Lannette Donath @ [phone] and left a message for her to call back.

## 2020-08-22 NOTE — Telephone Encounter (Signed)
Discussed with Neil Crouch and Maudie Mercury candidate.  Chart reviewed.  Clinical course appears stable thus far, this year.  We will leave procedures on the schedule.

## 2020-08-22 NOTE — Telephone Encounter (Signed)
Spoke to endo scheduler, she had left message on Mindy's voicemail but she is off today. Dr. Gala Romney had spoke to [facility] (pre-op nurse). Maudie Mercury will do pre-op phone call today and pt has been put back on for procedure 08/25/20. Maudie Mercury will be calling pt's girlfriend.  I called pt's girlfriend and informed he will have pre-op phone call today and procedure will be 08/25/20. Girlfriend requested for pre-op nurse to call her. Gave her pre-op nurse phone# if needed. I spoke to pre-op nurse and advised her to call girlfriend.

## 2020-08-22 NOTE — Telephone Encounter (Signed)
Neil Crouch PA advised pt hasn't had stroke since last OV and he is ok to proceed with procedure. She tried to call pt's girlfriend but unable to reach her. Magda Paganini wants to know if it would be possible for pt to have pre-op today and procedure as scheduled 08/25/20.  Tried to call endo scheduler to see if he could have pre-op appt today, LMOVM.

## 2020-08-22 NOTE — Pre-Procedure Instructions (Signed)
Scott Day, girlfriend calls back. I went over all preop information with her. She verbalized understanding and had no questions.

## 2020-08-22 NOTE — Telephone Encounter (Signed)
Discussed with Dr. Gala Romney. I don't seen documentation of recent stroke. 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 Followed by Dr. Estanislado Pandy. 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  Patient is on Brilinta.  when I saw him in 04/2020 he had planned angiography for 06/2020 due to history of in stent stenosis. Angiography stable and plans for repeat in six months. Tried to reach out to patient/girlfriend to confirm no recent acute issues, maybe addressed at outside facility but no answer.   Dr. Gala Romney to further investigate and make decisions. Tretha Sciara aware in case needs to be kept on schedule for Monday.

## 2020-08-25 ENCOUNTER — Encounter (HOSPITAL_COMMUNITY)
Admission: RE | Disposition: A | Discharge status: Home / Self Care | Payer: Self-pay | Source: Home / Self Care | Attending: Internal Medicine

## 2020-08-25 ENCOUNTER — Other Ambulatory Visit: Payer: Self-pay

## 2020-08-25 ENCOUNTER — Ambulatory Visit (HOSPITAL_COMMUNITY)
Admission: RE | Admit: 2020-08-25 | Discharge: 2020-08-25 | Disposition: A | Discharge status: Home / Self Care | Payer: Medicaid Other | Attending: Internal Medicine | Admitting: Internal Medicine

## 2020-08-25 ENCOUNTER — Ambulatory Visit (HOSPITAL_COMMUNITY): Payer: Medicaid Other | Admitting: Anesthesiology

## 2020-08-25 ENCOUNTER — Encounter (HOSPITAL_COMMUNITY): Payer: Self-pay | Admitting: Internal Medicine

## 2020-08-25 DIAGNOSIS — K219 Gastro-esophageal reflux disease without esophagitis: Secondary | ICD-10-CM | POA: Diagnosis not present

## 2020-08-25 DIAGNOSIS — R110 Nausea: Secondary | ICD-10-CM | POA: Diagnosis not present

## 2020-08-25 DIAGNOSIS — Z86010 Personal history of colonic polyps: Secondary | ICD-10-CM | POA: Diagnosis not present

## 2020-08-25 DIAGNOSIS — Z85038 Personal history of other malignant neoplasm of large intestine: Secondary | ICD-10-CM | POA: Insufficient documentation

## 2020-08-25 DIAGNOSIS — K259 Gastric ulcer, unspecified as acute or chronic, without hemorrhage or perforation: Secondary | ICD-10-CM | POA: Diagnosis not present

## 2020-08-25 DIAGNOSIS — K5140 Inflammatory polyps of colon without complications: Secondary | ICD-10-CM | POA: Diagnosis not present

## 2020-08-25 DIAGNOSIS — F319 Bipolar disorder, unspecified: Secondary | ICD-10-CM | POA: Diagnosis not present

## 2020-08-25 DIAGNOSIS — D125 Benign neoplasm of sigmoid colon: Secondary | ICD-10-CM | POA: Diagnosis not present

## 2020-08-25 DIAGNOSIS — Z7984 Long term (current) use of oral hypoglycemic drugs: Secondary | ICD-10-CM | POA: Diagnosis not present

## 2020-08-25 DIAGNOSIS — Z809 Family history of malignant neoplasm, unspecified: Secondary | ICD-10-CM | POA: Diagnosis not present

## 2020-08-25 DIAGNOSIS — R197 Diarrhea, unspecified: Secondary | ICD-10-CM | POA: Diagnosis not present

## 2020-08-25 DIAGNOSIS — Z79899 Other long term (current) drug therapy: Secondary | ICD-10-CM | POA: Diagnosis not present

## 2020-08-25 DIAGNOSIS — K635 Polyp of colon: Secondary | ICD-10-CM | POA: Diagnosis not present

## 2020-08-25 DIAGNOSIS — Z1211 Encounter for screening for malignant neoplasm of colon: Secondary | ICD-10-CM | POA: Diagnosis not present

## 2020-08-25 DIAGNOSIS — K30 Functional dyspepsia: Secondary | ICD-10-CM | POA: Diagnosis not present

## 2020-08-25 DIAGNOSIS — Z7982 Long term (current) use of aspirin: Secondary | ICD-10-CM | POA: Diagnosis not present

## 2020-08-25 DIAGNOSIS — Z791 Long term (current) use of non-steroidal anti-inflammatories (NSAID): Secondary | ICD-10-CM | POA: Insufficient documentation

## 2020-08-25 DIAGNOSIS — Z8673 Personal history of transient ischemic attack (TIA), and cerebral infarction without residual deficits: Secondary | ICD-10-CM | POA: Insufficient documentation

## 2020-08-25 DIAGNOSIS — K222 Esophageal obstruction: Secondary | ICD-10-CM | POA: Diagnosis not present

## 2020-08-25 DIAGNOSIS — I10 Essential (primary) hypertension: Secondary | ICD-10-CM | POA: Insufficient documentation

## 2020-08-25 DIAGNOSIS — E119 Type 2 diabetes mellitus without complications: Secondary | ICD-10-CM | POA: Insufficient documentation

## 2020-08-25 DIAGNOSIS — Z9049 Acquired absence of other specified parts of digestive tract: Secondary | ICD-10-CM | POA: Insufficient documentation

## 2020-08-25 DIAGNOSIS — F17210 Nicotine dependence, cigarettes, uncomplicated: Secondary | ICD-10-CM | POA: Insufficient documentation

## 2020-08-25 DIAGNOSIS — K2100 Gastro-esophageal reflux disease with esophagitis, without bleeding: Secondary | ICD-10-CM | POA: Insufficient documentation

## 2020-08-25 HISTORY — PX: ESOPHAGOGASTRODUODENOSCOPY (EGD) WITH PROPOFOL: SHX5813

## 2020-08-25 HISTORY — PX: BIOPSY: SHX5522

## 2020-08-25 HISTORY — PX: POLYPECTOMY: SHX149

## 2020-08-25 HISTORY — PX: COLONOSCOPY WITH PROPOFOL: SHX5780

## 2020-08-25 LAB — GLUCOSE, CAPILLARY
Glucose-Capillary: 161 mg/dL — ABNORMAL HIGH (ref 70–99)
Glucose-Capillary: 159 mg/dL — ABNORMAL HIGH (ref 70–99)

## 2020-08-25 SURGERY — COLONOSCOPY WITH PROPOFOL
Anesthesia: General

## 2020-08-25 MED ORDER — GLYCOPYRROLATE 0.2 MG/ML IJ SOLN
INTRAMUSCULAR | Status: DC | PRN
  Administered 2020-08-25: .2 mg via INTRAVENOUS

## 2020-08-25 MED ORDER — LIDOCAINE HCL (CARDIAC) PF 100 MG/5ML IV SOSY
PREFILLED_SYRINGE | INTRAVENOUS | Status: DC | PRN
  Administered 2020-08-25: 50 mg via INTRAVENOUS

## 2020-08-25 MED ORDER — KETAMINE HCL 10 MG/ML IJ SOLN
INTRAMUSCULAR | Status: DC | PRN
  Administered 2020-08-25: 10 mg via INTRAVENOUS
  Administered 2020-08-25: 20 mg via INTRAVENOUS

## 2020-08-25 MED ORDER — PROPOFOL 10 MG/ML IV BOLUS
INTRAVENOUS | Status: DC | PRN
  Administered 2020-08-25: 50 mg via INTRAVENOUS
  Administered 2020-08-25: 100 mg via INTRAVENOUS
  Administered 2020-08-25 (×5): 50 mg via INTRAVENOUS

## 2020-08-25 MED ORDER — LACTATED RINGERS IV SOLN: INTRAVENOUS | Status: DC

## 2020-08-25 MED ORDER — LIDOCAINE HCL (PF) 2 % IJ SOLN
INTRAMUSCULAR | Status: AC
  Filled 2020-08-25: qty 5

## 2020-08-25 MED ORDER — KETAMINE HCL 50 MG/5ML IJ SOSY
PREFILLED_SYRINGE | INTRAMUSCULAR | Status: AC
  Filled 2020-08-25: qty 5

## 2020-08-25 MED ORDER — STERILE WATER FOR IRRIGATION IR SOLN
Status: DC | PRN
  Administered 2020-08-25: 200 mL

## 2020-08-25 MED ORDER — PROPOFOL 1000 MG/100ML IV EMUL
INTRAVENOUS | Status: AC
  Filled 2020-08-25: qty 100

## 2020-08-25 NOTE — Addendum Note (Signed)
Addendum  created 08/25/20 0902 by Lyda Jester, CRNA   Charge Capture section accepted

## 2020-08-25 NOTE — Op Note (Signed)
[facility] Patient Name: Scott Day Procedure Date: 08/25/2020 7:48 AM MRN: [id number] Date of Birth: 1960/12/18 Attending MD: Norvel Richards , MD CSN: [id number] Age: 60 Admit Type: Outpatient Procedure:                Colonoscopy Indications:              High risk colon cancer surveillance: Personal                            history of colonic polyps; diarrhea Providers:                Norvel Richards, MD, Crystal Page, Raphael Gibney, Technician Referring MD:              Medicines:                Propofol per Anesthesia Complications:            No immediate complications. Estimated Blood Loss:     Estimated blood loss was minimal. Procedure:                Pre-Anesthesia Assessment:                           - Prior to the procedure, a History and Physical                            was performed, and patient medications and                            allergies were reviewed. The patient's tolerance of                            previous anesthesia was also reviewed. The risks                            and benefits of the procedure and the sedation                            options and risks were discussed with the patient.                            All questions were answered, and informed consent                            was obtained. Prior Anticoagulants: The patient                            Brilinta and aspirin. ASA Grade Assessment: III - A                            patient with severe systemic disease. After  reviewing the risks and benefits, the patient was                            deemed in satisfactory condition to undergo the                            procedure.                           After obtaining informed consent, the colonoscope                            was passed under direct vision. Throughout the                            procedure, the patient's blood pressure, pulse, and                             oxygen saturations were monitored continuously. The                            PCF-[id number] ([id number]) was introduced through the                            anus and advanced to the the ileocolonic                            anastomosis. The colonoscopy was performed without                            difficulty. The patient tolerated the procedure                            well. The quality of the bowel preparation was                            adequate. Scope In: 7:52:20 AM Scope Out: 5:27:78 AM Scope Withdrawal Time: 0 hours 12 minutes 1 second  Total Procedure Duration: 0 hours 14 minutes 27 seconds  Findings:      The perianal and digital rectal examinations were normal. Evidence of       prior right colon surgery with normal. Anastomosis with small bowel.      Three semi-pedunculated polyps were found in the sigmoid colon. The       polyps were 3 to 7 mm in size. These polyps were removed with a cold       snare. Resection and retrieval were complete. Estimated blood loss was       minimal. Segmental biopsies transverse and left colon taken for       histologic study.      The exam was otherwise without abnormality on direct and retroflexion       views. Impression:               - Status post right hemicolectomy. Three 3 to 7 mm  polyps in the sigmoid colon, removed with a cold                            snare. Resected and retrieved. That is post                            segmental biopsy to further evaluate diarrhea.                           - The examination was otherwise normal on direct                            and retroflexion views. Moderate Sedation:      Moderate (conscious) sedation was personally administered by an       anesthesia professional. The following parameters were monitored: oxygen       saturation, heart rate, blood pressure, respiratory rate, EKG, adequacy       of pulmonary ventilation, and  response to care. Recommendation:           - Patient has a contact number available for                            emergencies. The signs and symptoms of potential                            delayed complications were discussed with the                            patient. Return to normal activities tomorrow.                            Written discharge instructions were provided to the                            patient.                           - Advance diet as tolerated. Office visit with [place]                            in 6 weeks                           Follow-up on pathology. See EGD report. Procedure Code(s):        --- Professional ---                           [phone], Colonoscopy, flexible; with removal of                            tumor(s), polyp(s), or other lesion(s) by snare                            technique Diagnosis Code(s):        --- Professional ---  Z86.010, Personal history of colonic polyps                           K63.5, Polyp of colon CPT copyright 2019 American Medical Association. All rights reserved. The codes documented in this report are preliminary and upon coder review may  be revised to meet current compliance requirements. [name]. Chisum Habenicht, MD Norvel Richards, MD 08/25/2020 8:15:35 AM This report has been signed electronically. Number of Addenda: 0

## 2020-08-25 NOTE — Discharge Instructions (Signed)
Monitored Anesthesia Care, Care After This sheet gives you information about how to care for yourself after your procedure. Your health care provider may also give you more specific instructions. If you have problems or questions, contact your health care provider. What can I expect after the procedure? After the procedure, it is common to have:  Tiredness.  Forgetfulness about what happened after the procedure.  Impaired judgment for important decisions.  Nausea or vomiting.  Some difficulty with balance. Follow these instructions at home: For the time period you were told by your health care provider:  Rest as needed.  Do not participate in activities where you could fall or become injured.  Do not drive or use machinery.  Do not drink alcohol.  Do not take sleeping pills or medicines that cause drowsiness.  Do not make important decisions or sign legal documents.  Do not take care of children on your own.      Eating and drinking  Follow the diet that is recommended by your health care provider.  Drink enough fluid to keep your urine pale yellow.  If you vomit: ? Drink water, juice, or soup when you can drink without vomiting. ? Make sure you have little or no nausea before eating solid foods. General instructions  Have a responsible adult stay with you for the time you are told. It is important to have someone help care for you until you are awake and alert.  Take over-the-counter and prescription medicines only as told by your health care provider.  If you have sleep apnea, surgery and certain medicines can increase your risk for breathing problems. Follow instructions from your health care provider about wearing your sleep device: ? Anytime you are sleeping, including during daytime naps. ? While taking prescription pain medicines, sleeping medicines, or medicines that make you drowsy.  Avoid smoking.  Keep all follow-up visits as told by your health  care provider. This is important. Contact a health care provider if:  You keep feeling nauseous or you keep vomiting.  You feel light-headed.  You are still sleepy or having trouble with balance after 24 hours.  You develop a rash.  You have a fever.  You have redness or swelling around the IV site. Get help right away if:  You have trouble breathing.  You have new-onset confusion at home. Summary  For several hours after your procedure, you may feel tired. You may also be forgetful and have poor judgment.  Have a responsible adult stay with you for the time you are told. It is important to have someone help care for you until you are awake and alert.  Rest as told. Do not drive or operate machinery. Do not drink alcohol or take sleeping pills.  Get help right away if you have trouble breathing, or if you suddenly become confused. This information is not intended to replace advice given to you by your health care provider. Make sure you discuss any questions you have with your health care provider. Document Revised: 11/22/2019 Document Reviewed: 02/08/2019 Elsevier Patient Education  2021 [employer].  Colonoscopy Discharge Instructions  Read the instructions outlined below and refer to this sheet in the next few weeks. These discharge instructions provide you with general information on caring for yourself after you leave the hospital. Your doctor may also give you specific instructions. While your treatment has been planned according to the most current medical practices available, unavoidable complications occasionally occur. If you have any problems or  questions after discharge, call Dr. Gala Romney at [phone]. ACTIVITY  You may resume your regular activity, but move at a slower pace for the next 24 hours.   Take frequent rest periods for the next 24 hours.   Walking will help get rid of the air and reduce the bloated feeling in your belly (abdomen).   No driving for 24  hours (because of the medicine (anesthesia) used during the test).    Do not sign any important legal documents or operate any machinery for 24 hours (because of the anesthesia used during the test).  NUTRITION  Drink plenty of fluids.   You may resume your normal diet as instructed by your doctor.   Begin with a light meal and progress to your normal diet. Heavy or fried foods are harder to digest and may make you feel sick to your stomach (nauseated).   Avoid alcoholic beverages for 24 hours or as instructed.  MEDICATIONS  You may resume your normal medications unless your doctor tells you otherwise.  WHAT YOU CAN EXPECT TODAY  Some feelings of bloating in the abdomen.   Passage of more gas than usual.   Spotting of blood in your stool or on the toilet paper.  IF YOU HAD POLYPS REMOVED DURING THE COLONOSCOPY:  No aspirin products for 7 days or as instructed.   No alcohol for 7 days or as instructed.   Eat a soft diet for the next 24 hours.  FINDING OUT THE RESULTS OF YOUR TEST Not all test results are available during your visit. If your test results are not back during the visit, make an appointment with your caregiver to find out the results. Do not assume everything is normal if you have not heard from your caregiver or the medical facility. It is important for you to follow up on all of your test results.  SEEK IMMEDIATE MEDICAL ATTENTION IF:  You have more than a spotting of blood in your stool.   Your belly is swollen (abdominal distention).   You are nauseated or vomiting.   You have a temperature over 101.   You have abdominal pain or discomfort that is severe or gets worse throughout the day.    Colonoscopy Discharge Instructions  Read the instructions outlined below and refer to this sheet in the next few weeks. These discharge instructions provide you with general information on caring for yourself after you leave the hospital. Your doctor may also give you  specific instructions. While your treatment has been planned according to the most current medical practices available, unavoidable complications occasionally occur. If you have any problems or questions after discharge, call Dr. Gala Romney at [phone]. ACTIVITY  You may resume your regular activity, but move at a slower pace for the next 24 hours.   Take frequent rest periods for the next 24 hours.   Walking will help get rid of the air and reduce the bloated feeling in your belly (abdomen).   No driving for 24 hours (because of the medicine (anesthesia) used during the test).    Do not sign any important legal documents or operate any machinery for 24 hours (because of the anesthesia used during the test).  NUTRITION  Drink plenty of fluids.   You may resume your normal diet as instructed by your doctor.   Begin with a light meal and progress to your normal diet. Heavy or fried foods are harder to digest and may make you feel sick to your stomach (nauseated).  Avoid alcoholic beverages for 24 hours or as instructed.  MEDICATIONS  You may resume your normal medications unless your doctor tells you otherwise.  WHAT YOU CAN EXPECT TODAY  Some feelings of bloating in the abdomen.   Passage of more gas than usual.   Spotting of blood in your stool or on the toilet paper.  IF YOU HAD POLYPS REMOVED DURING THE COLONOSCOPY:  No aspirin products for 7 days or as instructed.   No alcohol for 7 days or as instructed.   Eat a soft diet for the next 24 hours.  FINDING OUT THE RESULTS OF YOUR TEST Not all test results are available during your visit. If your test results are not back during the visit, make an appointment with your caregiver to find out the results. Do not assume everything is normal if you have not heard from your caregiver or the medical facility. It is important for you to follow up on all of your test results.  SEEK IMMEDIATE MEDICAL ATTENTION IF:  You have more than a  spotting of blood in your stool.   Your belly is swollen (abdominal distention).   You are nauseated or vomiting.   You have a temperature over 101.   You have abdominal pain or discomfort that is severe or gets worse throughout the day.   You have acid reflux irritation in your esophagus.  Your stomach is inflamed.  3 polyps removed in your colon  Stop Protonix; begin Dexilant 60 mg daily. New prescription provided  Further recommendations to follow pending review of pathology report  Office visit with Neil Crouch in 6 weeks  At patient request, I spoke to Lannette Donath at [phone] -reviewed results and recommendations.

## 2020-08-25 NOTE — Transfer of Care (Signed)
Immediate Anesthesia Transfer of Care Note  Patient: Scott Day  Procedure(s) Performed: COLONOSCOPY WITH PROPOFOL (N/A ) ESOPHAGOGASTRODUODENOSCOPY (EGD) WITH PROPOFOL (N/A ) BIOPSY POLYPECTOMY INTESTINAL  Patient Location: PACU  Anesthesia Type:General  Level of Consciousness: awake, alert  and oriented  Airway & Oxygen Therapy: Patient Spontanous Breathing  Post-op Assessment: Report given to RN, Post -op Vital signs reviewed and stable and Patient moving all extremities X 4  Post vital signs: Reviewed and stable  Last Vitals:  Vitals Value Taken Time  BP 120/82 08/25/20 0815  Temp 36.6 C 08/25/20 0815  Pulse 84 08/25/20 0815  Resp 18 08/25/20 0815  SpO2 96 % 08/25/20 0815    Last Pain:  Vitals:   08/25/20 0815  TempSrc: Oral  PainSc: 0-No pain         Complications: No complications documented.

## 2020-08-25 NOTE — Op Note (Signed)
[facility] Patient Name: Scott Day Procedure Date: 08/25/2020 7:06 AM MRN: [id number] Date of Birth: [birth year] Attending MD: Norvel Richards , MD CSN: [id number] Age: 60 Admit Type: Outpatient Procedure:                Upper GI endoscopy Indications:              Dyspepsia, Indigestion, diarrhea Providers:                Norvel Richards, MD, Crystal Page Referring MD:              Medicines:                Propofol per Anesthesia Complications:            No immediate complications. Estimated Blood Loss:     Estimated blood loss was minimal. Procedure:                Pre-Anesthesia Assessment:                           - Prior to the procedure, a History and Physical                            was performed, and patient medications and                            allergies were reviewed. The patient's tolerance of                            previous anesthesia was also reviewed. The risks                            and benefits of the procedure and the sedation                            options and risks were discussed with the patient.                            All questions were answered, and informed consent                            was obtained. Prior Anticoagulants: The patient                            Brilinta and aspirin. ASA Grade Assessment: III - A                            patient with severe systemic disease. After                            reviewing the risks and benefits, the patient was                            deemed in satisfactory condition to undergo the  procedure.                           After obtaining informed consent, the endoscope was                            passed under direct vision. Throughout the                            procedure, the patient's blood pressure, pulse, and                            oxygen saturations were monitored continuously. The                            [id number]-[id number] ([id number])  scope was introduced through the                            mouth, and advanced to the third part of duodenum. Scope In: 7:38:38 AM Scope Out: 7:44:03 AM Total Procedure Duration: 0 hours 5 minutes 25 seconds  Findings:      5 mm irregular straddling a noncritical Schatzki's ring. No Barrett's       epithelium seen. Patchy erythema and antral erosions present. No ulcer       or infiltrating process. Patent pylorus. Normal. First second and third       portion of the duodenum      Biopsies of the second third portion of the duodenum taken Impression:               - Mild erosive reflux esophagitis. Gastric                            erosions/erythema status post duodenal and gastric                            biopsy no specimens collected. Moderate Sedation:      Moderate (conscious) sedation was personally administered by an       anesthesia professional. The following parameters were monitored: oxygen       saturation, heart rate, blood pressure, respiratory rate, EKG, adequacy       of pulmonary ventilation, and response to care. Recommendation:           - Patient has a contact number available for                            emergencies. The signs and symptoms of potential                            delayed complications were discussed with the                            patient. Return to normal activities tomorrow.                            Written discharge instructions were provided to the  patient.                           - Advance diet as tolerated. Stop Protonix; begin                            Dexilant 60 mg daily. Follow-up on pathology. See                            colonoscopy report. Procedure Code(s):        --- Professional ---                           [phone], Esophagogastroduodenoscopy, flexible,                            transoral; diagnostic, including collection of                            specimen(s) by brushing or washing, when  performed                            (separate procedure) Diagnosis Code(s):        --- Professional ---                           R10.13, Epigastric pain                           K30, Functional dyspepsia CPT copyright 2019 American Medical Association. All rights reserved. The codes documented in this report are preliminary and upon coder review may  be revised to meet current compliance requirements. [name]. [name], MD Norvel Richards, MD 08/25/2020 8:11:10 AM This report has been signed electronically. Number of Addenda: 0

## 2020-08-25 NOTE — H&P (Addendum)
@LOGO @   Primary Care Physician:  Charlott Rakes, MD Primary Gastroenterologist:  Dr. Gala Romney  Pre-Procedure History & Physical: HPI:  Scott Day is a 60 y.o. male here for further evaluation of nausea reflux, diarrhea.  History of a right hemicolectomy for colon cancer last surveillance examination 2011.  Denies dysphagia.  Past Medical History:  Diagnosis Date  . Anxiety   . Bipolar disorder ([place])   . Cancer [facility]) 2012   colon cancer  . Closed fracture of left distal radius   . Colon cancer ([place])   . CVA (cerebral vascular accident) ([place]) 05/2019    right MCA territory infarction with right M1 distal occlusion status post stenting.    . Depression   . Diabetes ([place])   . GERD (gastroesophageal reflux disease)   . Hypertension   . Sleep apnea     Past Surgical History:  Procedure Laterality Date  . BUBBLE STUDY  06/08/2019   Procedure: BUBBLE STUDY;  Surgeon: Buford Dresser, MD;  Location: [facility] ENDOSCOPY;  Service: Cardiovascular;;  . COLON SURGERY  2004   colon cancer  . COLONOSCOPY  2011   Dr. Fuller Plan: Evidence of right hemicolectomy, polyps removed from the rectum, hyperplastic  . IR ANGIO INTRA EXTRACRAN SEL COM CAROTID INNOMINATE BILAT MOD SED  01/08/2020  . IR ANGIO INTRA EXTRACRAN SEL COM CAROTID INNOMINATE BILAT MOD SED  07/04/2020  . IR ANGIO VERTEBRAL SEL SUBCLAVIAN INNOMINATE BILAT MOD SED  01/08/2020  . IR ANGIO VERTEBRAL SEL SUBCLAVIAN INNOMINATE BILAT MOD SED  07/04/2020  . IR CT HEAD LTD  06/04/2019  . IR CT HEAD LTD  06/04/2019  . IR INTRA CRAN STENT  06/04/2019  . IR PERCUTANEOUS ART THROMBECTOMY/INFUSION INTRACRANIAL INC DIAG ANGIO  06/04/2019  . IR US GUIDE VASC ACCESS RIGHT  06/04/2019  . IR US GUIDE VASC ACCESS RIGHT  07/04/2020  . OPEN REDUCTION INTERNAL FIXATION (ORIF) DISTAL RADIAL FRACTURE Left 09/20/2017   Procedure: OPEN REDUCTION INTERNAL FIXATION (ORIF) DISTAL RADIAL FRACTURE;  Surgeon: Leanora Cover, MD;  Location: [place];   Service: Orthopedics;  Laterality: Left;  . RADIOLOGY WITH ANESTHESIA N/A 06/04/2019   Procedure: IR WITH ANESTHESIA;  Surgeon: Radiologist, Medication, MD;  Location: [place];  Service: Radiology;  Laterality: N/A;  . TEE WITHOUT CARDIOVERSION N/A 06/08/2019   Procedure: TRANSESOPHAGEAL ECHOCARDIOGRAM (TEE);  Surgeon: Buford Dresser, MD;  Location: [facility] ENDOSCOPY;  Service: Cardiovascular;  Laterality: N/A;    Prior to Admission medications   Medication Sig Start Date End Date Taking? Authorizing Provider  acetaminophen (TYLENOL) 500 MG tablet Take 1,000 mg by mouth every 6 (six) hours as needed for moderate pain or headache.   Yes [provider]  aspirin 81 MG chewable tablet Chew 1 tablet (81 mg total) by mouth daily. 06/10/19  Yes Rinehuls, Early Chars, PA-C  divalproex (DEPAKOTE ER) 500 MG 24 hr tablet Take 1 tablet (500 mg total) by mouth daily. 08/19/20  Yes Jamse Arn, MD  FLUoxetine (PROZAC) 20 MG capsule Take 1 capsule (20 mg total) by mouth daily. 10/24/19  Yes McCue, Janett Billow, NP  ibuprofen (ADVIL) 200 MG tablet Take 600-800 mg by mouth every 6 (six) hours as needed for headache or moderate pain.   Yes [provider]  lisinopril (ZESTRIL) 2.5 MG tablet Take 1 tablet (2.5 mg total) by mouth daily. 04/29/20  Yes Charlott Rakes, MD  metFORMIN (GLUCOPHAGE) 500 MG tablet Take 0.5 tablets (250 mg total) by mouth daily with breakfast. 04/29/20  Yes Newlin,  Enobong, MD  Omega-3 Fatty Acids (FISH OIL) 1200 MG CAPS Take 1,200 mg by mouth daily.   Yes [provider]  pantoprazole (PROTONIX) 40 MG tablet Take 1 tablet (40 mg total) by mouth daily before breakfast. 04/29/20  Yes Mahala Menghini, PA-C  QUEtiapine (SEROQUEL) 25 MG tablet Take 1 tablet (25 mg total) by mouth at bedtime as needed (for sleep). 05/06/20  Yes McCue, Janett Billow, NP  rosuvastatin (CRESTOR) 20 MG tablet Take 1 tablet (20 mg total) by mouth daily. Patient taking differently: Take 20 mg by mouth every  evening. 07/23/19  Yes McCue, Janett Billow, NP  ticagrelor (BRILINTA) 90 MG TABS tablet Take 1 tablet (90 mg total) by mouth 2 (two) times daily. Patient taking differently: Take 45 mg by mouth 2 (two) times daily. 06/14/19  Yes Angiulli, Lavon Paganini, PA-C  Accu-Chek Softclix Lancets lancets Use as instructed 10/03/19   Charlott Rakes, MD  Blood Glucose Monitoring Suppl (ACCU-CHEK AVIVA CONNECT) w/Device KIT Test blood sugar 3 times daily 10/02/19   Charlott Rakes, MD  glucose blood (ACCU-CHEK AVIVA PLUS) test strip Use as instructed 10/02/19   Charlott Rakes, MD  Lancets Misc. (ACCU-CHEK SOFTCLIX LANCET DEV) KIT Test blood sugar 3 times daily 10/02/19   Charlott Rakes, MD  Misc. Devices MISC CPAP with AutoPap 8-15.  Diagnosis obstructive sleep apnea. 05/05/20   Charlott Rakes, MD    Allergies as of 07/03/2020  . (No Known Allergies)    Family History  Problem Relation Age of Onset  . Cancer Mother        primary unknown  . Colon cancer Neg Hx     Social History   Socioeconomic History  . Marital status: Divorced    Spouse name: Not on file  . Number of children: Not on file  . Years of education: Not on file  . Highest education level: Not on file  Occupational History  . Not on file  Tobacco Use  . Smoking status: Current Every Day Smoker    Packs/day: 1.00    Years: 45.00    Pack years: 45.00    Types: Cigarettes    Start date: 07/20/2019  . Smokeless tobacco: Never Used  . 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 Vaping Use: Never used  Substance and Sexual Activity  . Alcohol use: Yes    Alcohol/week: 24.0 standard drinks    Types: 24 Cans of beer per week    Comment: 6 pk everyother day/last used 3 days ago  . Drug use: Never  . Sexual activity: Yes  Other Topics Concern  . Not on file  Social History Narrative  . Not on file   Social Determinants of Health   Financial Resource Strain: Not on file  Food Insecurity: Not on file   Transportation Needs: Not on file  Physical Activity: Not on file  Stress: Not on file  Social Connections: Not on file  Intimate Partner Violence: Not on file    Review of Systems: See HPI, otherwise negative ROS  Physical Exam: BP 132/78   Pulse 74   Temp 97.7 F (36.5 C) (Oral)   Resp 18   SpO2 94%  General:   Alert,  Well-developed, well-nourished, pleasant and cooperative in NAD  Mouth:  No deformity or lesions. Neck:  Supple; no masses or thyromegaly. No significant cervical adenopathy. Lungs:  Clear throughout to auscultation.   No wheezes, crackles, or rhonchi. No acute distress. Heart:  Regular rate and rhythm; no murmurs, clicks, rubs,  or gallops. Abdomen: Non-distended, normal bowel sounds.  Soft and nontender without appreciable mass or hepatosplenomegaly.  Pulses:  Normal pulses noted. Extremities:  Without clubbing or edema.  Impression/Plan: 60 year old gentleman with a history of right hemicolectomy of colon cancer chronic diarrhea.  Standing reflux symptoms nausea.  No dysphagia.  Here for surveillance colonoscopy and EGD. The risks, benefits, limitations, imponderables and alternatives regarding both EGD and colonoscopy have been reviewed with the patient. Questions have been answered. All parties agreeable.      Notice: This dictation was prepared with Dragon dictation along with smaller phrase technology. Any transcriptional errors that result from this process are unintentional and may not be corrected upon review.

## 2020-08-25 NOTE — Anesthesia Preprocedure Evaluation (Signed)
Anesthesia Evaluation  Patient identified by MRN, date of birth, ID band Patient awake    Reviewed: Allergy & Precautions, NPO status , Patient's Chart, lab work & pertinent test results  Airway Mallampati: III  TM Distance: >3 FB Neck ROM: Full    Dental  (+) Dental Advisory Given, Upper Dentures, Missing   Pulmonary sleep apnea and Continuous Positive Airway Pressure Ventilation , Current SmokerPatient did not abstain from smoking.,    Pulmonary exam normal breath sounds clear to auscultation       Cardiovascular Exercise Tolerance: Good hypertension, Pt. on medications Normal cardiovascular exam Rhythm:Regular Rate:Normal     Neuro/Psych PSYCHIATRIC DISORDERS Anxiety Depression Bipolar Disorder CVA, No Residual Symptoms    GI/Hepatic Neg liver ROS, GERD  Medicated and Controlled,Colon cancer   Endo/Other  diabetes, Well Controlled, Type 2, Oral Hypoglycemic Agents  Renal/GU      Musculoskeletal negative musculoskeletal ROS (+)   Abdominal   Peds  Hematology negative hematology ROS (+)   Anesthesia Other Findings   Reproductive/Obstetrics negative OB ROS                            Anesthesia Physical Anesthesia Plan  ASA: III  Anesthesia Plan: General   Post-op Pain Management:    Induction: Intravenous  PONV Risk Score and Plan: Propofol infusion  Airway Management Planned: Nasal Cannula and Natural Airway  Additional Equipment:   Intra-op Plan:   Post-operative Plan:   Informed Consent: I have reviewed the patients History and Physical, chart, labs and discussed the procedure including the risks, benefits and alternatives for the proposed anesthesia with the patient or authorized representative who has indicated his/her understanding and acceptance.     Dental advisory given  Plan Discussed with: CRNA and Surgeon  Anesthesia Plan Comments:         Anesthesia  Quick Evaluation

## 2020-08-25 NOTE — Anesthesia Postprocedure Evaluation (Signed)
Anesthesia Post Note  Patient: Ellwood Steidle Winkels  Procedure(s) Performed: COLONOSCOPY WITH PROPOFOL (N/A ) ESOPHAGOGASTRODUODENOSCOPY (EGD) WITH PROPOFOL (N/A ) BIOPSY POLYPECTOMY INTESTINAL  Patient location during evaluation: Endoscopy Anesthesia Type: General Level of consciousness: awake and alert Pain management: pain level controlled Vital Signs Assessment: post-procedure vital signs reviewed and stable Respiratory status: spontaneous breathing, nonlabored ventilation, respiratory function stable and patient connected to nasal cannula oxygen Cardiovascular status: blood pressure returned to baseline and stable Postop Assessment: no apparent nausea or vomiting Anesthetic complications: no   No complications documented.   Last Vitals:  Vitals:   08/25/20 0710 08/25/20 0815  BP: 132/78 120/82  Pulse: 74 84  Resp: 18 18  Temp: 36.5 C 36.6 C  SpO2: 94% 96%    Last Pain:  Vitals:   08/25/20 0815  TempSrc: Oral  PainSc: 0-No pain                 Talitha Givens

## 2020-08-26 ENCOUNTER — Encounter: Payer: Self-pay | Admitting: Internal Medicine

## 2020-08-26 LAB — SURGICAL PATHOLOGY

## 2020-08-27 DIAGNOSIS — I10 Essential (primary) hypertension: Secondary | ICD-10-CM | POA: Diagnosis not present

## 2020-08-27 DIAGNOSIS — R0683 Snoring: Secondary | ICD-10-CM | POA: Diagnosis not present

## 2020-08-27 DIAGNOSIS — G4733 Obstructive sleep apnea (adult) (pediatric): Secondary | ICD-10-CM | POA: Diagnosis not present

## 2020-08-27 DIAGNOSIS — R5383 Other fatigue: Secondary | ICD-10-CM | POA: Diagnosis not present

## 2020-08-31 ENCOUNTER — Other Ambulatory Visit: Payer: Self-pay | Admitting: Family Medicine

## 2020-08-31 DIAGNOSIS — E119 Type 2 diabetes mellitus without complications: Secondary | ICD-10-CM

## 2020-08-31 NOTE — Telephone Encounter (Signed)
last RF 04/29/20 #30 6 RF

## 2020-09-02 ENCOUNTER — Encounter (HOSPITAL_COMMUNITY): Payer: Self-pay | Admitting: Internal Medicine

## 2020-09-02 ENCOUNTER — Telehealth: Payer: Self-pay

## 2020-09-02 NOTE — Telephone Encounter (Signed)
Mr. Routh significant other phoned in stating that the pt was suppose to have a Rx for [place] phoned in to pharmacy. Pt had colonoscopy on 08/25/2020 by Dr. Sydell Axon. Has not been reported to me yet. Phoned and LMOVM of the pt to return call.

## 2020-09-04 ENCOUNTER — Telehealth: Payer: Self-pay

## 2020-09-04 NOTE — Telephone Encounter (Signed)
Pt has been approved 09/04/2020 to 09/11/2020 for Dexlansoprazole 60 mg cap. Approved J code. Will give to Manuela Schwartz to scan into the pt's chart

## 2020-09-09 NOTE — Progress Notes (Signed)
Carelink Summary Report / Loop Recorder 

## 2020-09-13 ENCOUNTER — Other Ambulatory Visit: Payer: Self-pay | Admitting: Physical Medicine & Rehabilitation

## 2020-09-16 ENCOUNTER — Emergency Department (HOSPITAL_COMMUNITY): Payer: Medicaid Other

## 2020-09-16 ENCOUNTER — Emergency Department (HOSPITAL_COMMUNITY)
Admission: EM | Admit: 2020-09-16 | Discharge: 2020-09-16 | Disposition: A | Discharge status: Home / Self Care | Payer: Medicaid Other | Attending: Emergency Medicine | Admitting: Emergency Medicine

## 2020-09-16 ENCOUNTER — Ambulatory Visit (INDEPENDENT_AMBULATORY_CARE_PROVIDER_SITE_OTHER): Payer: Medicaid Other

## 2020-09-16 ENCOUNTER — Encounter (HOSPITAL_COMMUNITY): Payer: Self-pay

## 2020-09-16 ENCOUNTER — Other Ambulatory Visit: Payer: Self-pay

## 2020-09-16 DIAGNOSIS — F17210 Nicotine dependence, cigarettes, uncomplicated: Secondary | ICD-10-CM | POA: Insufficient documentation

## 2020-09-16 DIAGNOSIS — Z85038 Personal history of other malignant neoplasm of large intestine: Secondary | ICD-10-CM | POA: Insufficient documentation

## 2020-09-16 DIAGNOSIS — I10 Essential (primary) hypertension: Secondary | ICD-10-CM | POA: Diagnosis not present

## 2020-09-16 DIAGNOSIS — R0602 Shortness of breath: Secondary | ICD-10-CM | POA: Insufficient documentation

## 2020-09-16 DIAGNOSIS — Z7984 Long term (current) use of oral hypoglycemic drugs: Secondary | ICD-10-CM | POA: Insufficient documentation

## 2020-09-16 DIAGNOSIS — Z7982 Long term (current) use of aspirin: Secondary | ICD-10-CM | POA: Insufficient documentation

## 2020-09-16 DIAGNOSIS — I639 Cerebral infarction, unspecified: Secondary | ICD-10-CM

## 2020-09-16 DIAGNOSIS — E119 Type 2 diabetes mellitus without complications: Secondary | ICD-10-CM | POA: Insufficient documentation

## 2020-09-16 DIAGNOSIS — R519 Headache, unspecified: Secondary | ICD-10-CM | POA: Diagnosis not present

## 2020-09-16 DIAGNOSIS — F10129 Alcohol abuse with intoxication, unspecified: Secondary | ICD-10-CM | POA: Insufficient documentation

## 2020-09-16 DIAGNOSIS — Y907 Blood alcohol level of 200-239 mg/100 ml: Secondary | ICD-10-CM | POA: Insufficient documentation

## 2020-09-16 DIAGNOSIS — Z79899 Other long term (current) drug therapy: Secondary | ICD-10-CM | POA: Diagnosis not present

## 2020-09-16 DIAGNOSIS — R059 Cough, unspecified: Secondary | ICD-10-CM | POA: Diagnosis not present

## 2020-09-16 DIAGNOSIS — F1010 Alcohol abuse, uncomplicated: Secondary | ICD-10-CM

## 2020-09-16 DIAGNOSIS — R55 Syncope and collapse: Secondary | ICD-10-CM | POA: Diagnosis not present

## 2020-09-16 LAB — COMPREHENSIVE METABOLIC PANEL
ALT: 32 U/L (ref 0–44)
AST: 23 U/L (ref 15–41)
Albumin: 4 g/dL (ref 3.5–5.0)
Alkaline Phosphatase: 65 U/L (ref 38–126)
Anion gap: 9 (ref 5–15)
BUN: 7 mg/dL (ref 6–20)
CO2: 25 mmol/L (ref 22–32)
Calcium: 8.4 mg/dL — ABNORMAL LOW (ref 8.9–10.3)
Chloride: 100 mmol/L (ref 98–111)
Creatinine, Ser: 1 mg/dL (ref 0.61–1.24)
GFR, Estimated: >60 mL/min (ref >60)
Glucose, Bld: 145 mg/dL — ABNORMAL HIGH (ref 70–99)
Potassium: 3.7 mmol/L (ref 3.5–5.1)
Sodium: 134 mmol/L — ABNORMAL LOW (ref 135–145)
Total Bilirubin: 0.7 mg/dL (ref 0.3–1.2)
Total Protein: 6.8 g/dL (ref 6.5–8.1)

## 2020-09-16 LAB — CUP PACEART REMOTE DEVICE CHECK
Date Time Interrogation Session: 20220627215119
Implantable Pulse Generator Implant Date: 20210924

## 2020-09-16 LAB — CBC
HCT: 39.2 % (ref 39.0–52.0)
Hemoglobin: 13.6 g/dL (ref 13.0–17.0)
MCH: 32.9 pg (ref 26.0–34.0)
MCHC: 34.7 g/dL (ref 30.0–36.0)
MCV: 94.7 fL (ref 80.0–100.0)
Platelets: 178 10*3/uL (ref 150–400)
RBC: 4.14 MIL/uL — ABNORMAL LOW (ref 4.22–5.81)
RDW: 11.8 % (ref 11.5–15.5)
WBC: 6.8 10*3/uL (ref 4.0–10.5)
nRBC: 0 % (ref 0.0–0.2)

## 2020-09-16 LAB — ETHANOL: Alcohol, Ethyl (B): 209 mg/dL — ABNORMAL HIGH (ref <10)

## 2020-09-16 LAB — PROTIME-INR
INR: 1 (ref 0.8–1.2)
Prothrombin Time: 13.4 seconds (ref 11.4–15.2)

## 2020-09-16 MED ORDER — ALBUTEROL SULFATE (2.5 MG/3ML) 0.083% IN NEBU: 3.0000 mL | INHALATION_SOLUTION | RESPIRATORY_TRACT | Status: DC | PRN

## 2020-09-16 NOTE — ED Provider Notes (Addendum)
[facility] EMERGENCY DEPARTMENT Provider Note   CSN: [id number] Arrival date & time: 09/16/20  2030     History Chief Complaint  Patient presents with   Altered Mental Status   Shortness of Breath   Cough    Scott Day is a 60 y.o. male.  Patient has syncopal episode and some slurred speech.  Patient has no complaints of pain.  The history is provided by the patient, a relative and medical records. No language interpreter was used.  Altered Mental Status Presenting symptoms: no behavior changes   Severity:  Mild Most recent episode:  Today Episode history:  Single Timing:  Intermittent Progression:  Waxing and waning Chronicity:  New Context: alcohol use   Associated symptoms: no abdominal pain, no hallucinations, no headaches, no rash and no seizures   Shortness of Breath Associated symptoms: cough   Associated symptoms: no abdominal pain, no chest pain, no headaches and no rash   Cough Associated symptoms: shortness of breath   Associated symptoms: no chest pain, no eye discharge, no headaches and no rash       Past Medical History:  Diagnosis Date   Anxiety    Bipolar disorder ([place])    Cancer ([place]) 2012   colon cancer   Closed fracture of left distal radius    Colon cancer ([place])    CVA (cerebral vascular accident) ([place]) 05/2019    right MCA territory infarction with right M1 distal occlusion status post stenting.     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/2019   Cerebrovascular accident (CVA) due to thrombosis of right middle cerebral artery (HCC)    Essential hypertension    Oropharyngeal dysphagia    Left hemiparesis (HCC)    Class 1 obesity due to excess calories with serious comorbidity and body mass index (BMI) of 32.0 to 32.9 in adult    Acute ischemic cerebrovascular accident (CVA) involving right middle cerebral artery territory ([place]) 06/04/2019   Arterial ischemic stroke, MCA, right, acute ([place]) 06/04/2019    Past Surgical History:  Procedure Laterality Date   BIOPSY  08/25/2020   Procedure: BIOPSY;  Surgeon: Daneil Dolin, MD;  [facility];  Service: Endoscopy;;   BUBBLE STUDY  06/08/2019   Procedure: BUBBLE STUDY;  Surgeon: Buford Dresser, MD;  Location: [place];  Service: Cardiovascular;;   COLON SURGERY  2004   colon cancer   COLONOSCOPY  2011   Dr. Fuller Plan: Evidence of right hemicolectomy, polyps removed from the rectum, hyperplastic   COLONOSCOPY WITH PROPOFOL N/A 08/25/2020  Procedure: COLONOSCOPY WITH PROPOFOL;  Surgeon: Daneil Dolin, MD;  [facility] END[facility];  Service: Endoscopy;  Laterality: N/A;  7:30am   ESOPHAGOGASTRODUODENOSCOPY (EGD) WITH PROPOFOL N/A 08/25/2020   Procedure: ESOPHAGOGASTRODUODENOSCOPY (EGD) WITH PROPOFOL;  Surgeon: Daneil Dolin, MD;  [facility];  Service: Endoscopy;  Laterality: N/A;   IR ANGIO INTRA EXTRACRAN SEL COM CAROTID INNOMINATE BILAT MOD SED  01/08/2020   IR ANGIO INTRA EXTRACRAN SEL COM CAROTID INNOMINATE BILAT MOD SED  07/04/2020   IR ANGIO VERTEBRAL SEL SUBCLAVIAN INNOMINATE BILAT MOD SED  01/08/2020   IR ANGIO  VERTEBRAL SEL SUBCLAVIAN INNOMINATE BILAT MOD SED  07/04/2020   IR CT HEAD LTD  06/04/2019   IR CT HEAD LTD  06/04/2019   IR INTRA CRAN STENT  06/04/2019   IR PERCUTANEOUS ART THROMBECTOMY/INFUSION INTRACRANIAL INC DIAG ANGIO  06/04/2019   IR US GUIDE VASC ACCESS RIGHT  06/04/2019   IR US GUIDE VASC ACCESS RIGHT  07/04/2020   OPEN REDUCTION INTERNAL FIXATION (ORIF) DISTAL RADIAL FRACTURE Left 09/20/2017   Procedure: OPEN REDUCTION INTERNAL FIXATION (ORIF) DISTAL RADIAL FRACTURE;  Surgeon: Leanora Cover, MD;  Location: [place];  Service: Orthopedics;  Laterality: Left;   POLYPECTOMY  08/25/2020   Procedure: POLYPECTOMY INTESTINAL;  Surgeon: Daneil Dolin, MD;  [facility];  Service: Endoscopy;;   RADIOLOGY WITH ANESTHESIA N/A 06/04/2019   Procedure: IR WITH ANESTHESIA;  Surgeon: Radiologist, Medication, MD;  Location: [place];  Service: Radiology;  Laterality: N/A;   TEE WITHOUT CARDIOVERSION N/A 06/08/2019   Procedure: TRANSESOPHAGEAL ECHOCARDIOGRAM (TEE);  Surgeon: Buford Dresser, MD;  Location[facility] ENDOSCOPY;  Service: Cardiovascular;  Laterality: N/A;       Family History  Problem Relation Age of Onset   Cancer Mother        primary unknown   Colon cancer Neg Hx     Social History   Tobacco Use   Smoking status: Every Day    Packs/day: 1.00    Years: 45.00    Pack years: 45.00    Types: Cigarettes    Start date: 07/20/2019   Smokeless tobacco: Never   Tobacco comments:    started back in April "patches did not work"  Scientific laboratory technician Use: Never used  Substance Use Topics   Alcohol use: Yes    Alcohol/week: 24.0 standard drinks    Types: 24 Cans of beer per week    Comment: 6 pk everyother day/last used 3 days ago   Drug use: Never    Home Medications Prior to Admission medications   Medication Sig Start Date End Date Taking? Authorizing Provider  Accu-Chek Softclix Lancets lancets Use as instructed 10/03/19   Charlott Rakes, MD   acetaminophen (TYLENOL) 500 MG tablet Take 1,000 mg by mouth every 6 (six) hours as needed for moderate pain or headache.    [provider]  aspirin 81 MG chewable tablet Chew 1 tablet (81 mg total) by mouth daily. 06/10/19   Rinehuls, Early Chars, PA-C  Blood Glucose Monitoring Suppl (ACCU-CHEK AVIVA CONNECT) w/Device KIT Test blood sugar 3 times daily 10/02/19   Charlott Rakes, MD  divalproex (DEPAKOTE ER) 500 MG 24 hr tablet Take 1 tablet (500 mg total) by mouth daily. 08/19/20   Jamse Arn, MD  FLUoxetine (PROZAC) 20 MG capsule Take 1 capsule (20 mg total) by mouth daily. 10/24/19   Frann Rider, NP  glucose blood (ACCU-CHEK AVIVA PLUS) test strip Use as instructed  10/02/19   Charlott Rakes, MD  ibuprofen (ADVIL) 200 MG tablet Take 600-800 mg by mouth every 6 (six) hours as needed for headache or moderate pain.    [provider]  Lancets Misc. (ACCU-CHEK SOFTCLIX LANCET DEV) KIT Test blood sugar 3 times daily 10/02/19   Charlott Rakes, MD  lisinopril (ZESTRIL) 2.5 MG tablet Take 1 tablet (2.5 mg total) by mouth daily. 04/29/20   Charlott Rakes, MD  metFORMIN (GLUCOPHAGE) 500 MG tablet Take 0.5 tablets (250 mg total) by mouth daily with breakfast. 04/29/20   Charlott Rakes, MD  Misc. Devices MISC CPAP with AutoPap 8-15.  Diagnosis obstructive sleep apnea. 05/05/20   Charlott Rakes, MD  Omega-3 Fatty Acids (FISH OIL) 1200 MG CAPS Take 1,200 mg by mouth daily.    [provider]  QUEtiapine (SEROQUEL) 25 MG tablet Take 1 tablet (25 mg total) by mouth at bedtime as needed (for sleep). 05/06/20   Frann Rider, NP  rosuvastatin (CRESTOR) 20 MG tablet Take 1 tablet (20 mg total) by mouth daily. Patient taking differently: Take 20 mg by mouth every evening. 07/23/19   Frann Rider, NP  ticagrelor (BRILINTA) 90 MG TABS tablet Take 1 tablet (90 mg total) by mouth 2 (two) times daily. Patient taking differently: Take 45 mg by mouth 2 (two) times daily. 06/14/19   Angiulli,  Lavon Paganini, PA-C    Allergies    Patient has no known allergies.  Review of Systems   Review of Systems  Constitutional:  Negative for appetite change and fatigue.  HENT:  Negative for congestion, ear discharge and sinus pressure.   Eyes:  Negative for discharge.  Respiratory:  Positive for cough and shortness of breath.   Cardiovascular:  Negative for chest pain.  Gastrointestinal:  Negative for abdominal pain and diarrhea.  Genitourinary:  Negative for frequency and hematuria.  Musculoskeletal:  Negative for back pain.  Skin:  Negative for rash.  Neurological:  Negative for seizures and headaches.  Psychiatric/Behavioral:  Negative for hallucinations.    Physical Exam Updated Vital Signs BP (!) 104/58   Pulse 71   Temp 98.2 F (36.8 C) (Oral)   Resp 15   Ht 5' 10"  (1.778 m)   Wt 108.9 kg   SpO2 99%   BMI 34.44 kg/m   Physical Exam Vitals and nursing note reviewed.  Constitutional:      Appearance: He is well-developed.  HENT:     Head: Normocephalic.     Nose: Nose normal.  Eyes:     General: No scleral icterus.    Conjunctiva/sclera: Conjunctivae normal.  Neck:     Thyroid: No thyromegaly.  Cardiovascular:     Rate and Rhythm: Normal rate and regular rhythm.     Heart sounds: No murmur heard.   No friction rub. No gallop.  Pulmonary:     Breath sounds: No stridor. No wheezing or rales.  Chest:     Chest wall: No tenderness.  Abdominal:     General: There is no distension.     Tenderness: There is no abdominal tenderness. There is no rebound.  Musculoskeletal:        General: Normal range of motion.     Cervical back: Neck supple.  Lymphadenopathy:     Cervical: No cervical adenopathy.  Skin:    Findings: No erythema or rash.  Neurological:     Mental Status: He is alert and oriented to person, place, and time.     Motor: No abnormal muscle tone.  Coordination: Coordination normal.  Psychiatric:        Behavior: Behavior normal.    ED Results  / Procedures / Treatments   Labs (all labs ordered are listed, but only abnormal results are displayed) Labs Reviewed  CBC - Abnormal; Notable for the following components:      Result Value   RBC 4.14 (*)    All other components within normal limits  COMPREHENSIVE METABOLIC PANEL - Abnormal; Notable for the following components:   Sodium 134 (*)    Glucose, Bld 145 (*)    Calcium 8.4 (*)    All other components within normal limits  ETHANOL - Abnormal; Notable for the following components:   Alcohol, Ethyl (B) 209 (*)    All other components within normal limits  PROTIME-INR    EKG None  Radiology DG Chest 2 View  Result Date: 09/16/2020 CLINICAL DATA:  Cough, shortness of breath EXAM: CHEST - 2 VIEW COMPARISON:  06/09/2019 FINDINGS: Heart and mediastinal contours are within normal limits. No focal opacities or effusions. No acute bony abnormality. Loop recorder device in place. IMPRESSION: No active cardiopulmonary disease. Electronically Signed   By: Rolm Baptise M.D.   On: 09/16/2020 21:32   CT Head Wo Contrast  Result Date: 09/16/2020 CLINICAL DATA:  Headache. EXAM: CT HEAD WITHOUT CONTRAST TECHNIQUE: Contiguous axial images were obtained from the base of the skull through the vertex without intravenous contrast. COMPARISON:  June 05, 2019 FINDINGS: Brain: There is mild cerebral atrophy with widening of the extra-axial spaces and ventricular dilatation. There are areas of decreased attenuation within the white matter tracts of the supratentorial brain, consistent with microvascular disease changes. Cortical encephalomalacia, with adjacent white matter low attenuation, is seen within the right parietal lobe. Vascular: A right MCA stent is in place. Skull: Normal. Negative for fracture or focal lesion. Sinuses/Orbits: No acute finding. Other: None. IMPRESSION: 1. Generalized cerebral atrophy without an acute intracranial abnormality. 2. Chronic right MCA territory infarcts.  Electronically Signed   By: Virgina Norfolk M.D.   On: 09/16/2020 22:49   CUP PACEART REMOTE DEVICE CHECK  Result Date: 09/16/2020 ILR summary report received. Battery status OK. Normal device function. No new symptom, tachy, brady, or pause episodes. No new AF episodes. Monthly summary reports and ROV/PRN   Procedures Procedures   Medications Ordered in ED Medications  albuterol (PROVENTIL) (2.5 MG/3ML) 0.083% nebulizer solution 3 mL (has no administration in time range)    ED Course  I have reviewed the triage vital signs and the nursing notes.  Pertinent labs & imaging results that were available during my care of the patient were reviewed by me and considered in my medical decision making (see chart for details).  CT scan negative for acute problem.  EtOH over 200. MDM Rules/Calculators/A&P                         Patient with syncopal episode most likely related to alcohol abuse  Final Clinical Impression(s) / ED Diagnoses Final diagnoses:  Syncope and collapse  ETOH abuse    Rx / DC Orders ED Discharge Orders     None        Milton Ferguson, MD 09/19/20 1048    Milton Ferguson, MD 09/19/20 1049

## 2020-09-16 NOTE — Discharge Instructions (Addendum)
Follow-up with your family doctor next week for recheck. 

## 2020-09-16 NOTE — ED Triage Notes (Signed)
Pov from home with friend who states that the patient's daughter called her saying that he is confused, slurred speech, and possibly fell. Symptoms started around 7pm. Has had 6-8 beer but that is not much for him.  Hx of stroke.

## 2020-09-17 ENCOUNTER — Telehealth: Payer: Self-pay | Admitting: *Deleted

## 2020-09-17 NOTE — Telephone Encounter (Signed)
Transition Care Management Follow-up Telephone Call Date of discharge and from where: 09/16/2020 - [facility] How have you been since you were released from the hospital? "I'm alright" Any questions or concerns? No  Items Reviewed: Did the pt receive and understand the discharge instructions provided? Yes  Medications obtained and verified?  N/A Other? No  Any new allergies since your discharge? No  Dietary orders reviewed? No Do you have support at home? No    Functional Questionnaire: (I = Independent and D = Dependent) ADLs: I  Bathing/Dressing- I  Meal Prep- I  Eating- I  Maintaining continence- I  Transferring/Ambulation- I  Managing Meds- I  Follow up appointments reviewed:  PCP Hospital f/u appt confirmed? No  - has routine appointment with Dr. Margarita Rana on 10/07/2020 Specialist Hospital f/u appt confirmed? No   Are transportation arrangements needed? No  If their condition worsens, is the pt aware to call PCP or go to the Emergency Dept.? Yes Was the patient provided with contact information for the PCP's office or ED? Yes Was to pt encouraged to call back with questions or concerns? Yes

## 2020-09-19 DIAGNOSIS — I10 Essential (primary) hypertension: Secondary | ICD-10-CM | POA: Diagnosis not present

## 2020-09-19 DIAGNOSIS — R0683 Snoring: Secondary | ICD-10-CM | POA: Diagnosis not present

## 2020-09-19 DIAGNOSIS — R5383 Other fatigue: Secondary | ICD-10-CM | POA: Diagnosis not present

## 2020-09-19 DIAGNOSIS — G4733 Obstructive sleep apnea (adult) (pediatric): Secondary | ICD-10-CM | POA: Diagnosis not present

## 2020-10-06 NOTE — Progress Notes (Signed)
Carelink Summary Report / Loop Recorder 

## 2020-10-07 ENCOUNTER — Encounter: Payer: Self-pay | Admitting: Family Medicine

## 2020-10-07 ENCOUNTER — Other Ambulatory Visit: Payer: Self-pay

## 2020-10-07 ENCOUNTER — Ambulatory Visit: Payer: Medicaid Other | Attending: Family Medicine | Admitting: Family Medicine

## 2020-10-07 DIAGNOSIS — Z8673 Personal history of transient ischemic attack (TIA), and cerebral infarction without residual deficits: Secondary | ICD-10-CM | POA: Diagnosis not present

## 2020-10-07 DIAGNOSIS — I152 Hypertension secondary to endocrine disorders: Secondary | ICD-10-CM | POA: Diagnosis not present

## 2020-10-07 DIAGNOSIS — I69319 Unspecified symptoms and signs involving cognitive functions following cerebral infarction: Secondary | ICD-10-CM | POA: Diagnosis not present

## 2020-10-07 DIAGNOSIS — E1159 Type 2 diabetes mellitus with other circulatory complications: Secondary | ICD-10-CM | POA: Diagnosis not present

## 2020-10-07 DIAGNOSIS — N528 Other male erectile dysfunction: Secondary | ICD-10-CM | POA: Diagnosis not present

## 2020-10-07 MED ORDER — SILDENAFIL CITRATE 50 MG PO TABS
50.0000 mg | ORAL_TABLET | Freq: Every day | ORAL | 1 refills | Status: DC | PRN
Start: 2020-10-07 — End: 2020-11-05

## 2020-10-07 MED ORDER — METFORMIN HCL 500 MG PO TABS: 250.0000 mg | ORAL_TABLET | Freq: Every day | ORAL | 6 refills | Status: AC

## 2020-10-07 MED ORDER — LISINOPRIL 2.5 MG PO TABS: 2.5000 mg | ORAL_TABLET | Freq: Every day | ORAL | 1 refills | Status: DC

## 2020-10-07 NOTE — Progress Notes (Signed)
Virtual Visit via Video Note  I connected with FIELD STANISZEWSKI, on 10/07/2020 at 9:28 AM by video enabled telemedicine device due to the COVID-19 pandemic and verified that I am speaking with the correct person using two identifiers.   Consent: I discussed the limitations, risks, security and privacy concerns of performing an evaluation and management service by telemedicine and the availability of in person appointments. I also discussed with the patient that there may be a patient responsible charge related to this service. The patient expressed understanding and agreed to proceed.   Location of Patient: Home  Location of Provider: Clinic   Persons participating in Telemedicine visit: KINSLER SOEDER Male friend Dr. Margarita Rana     History of Present Illness: CHADWICK REISWIG is a 60 year old male with a history of newly diagnosed type 2 diabetes mellitus (A1c 6.3), hypertension, history of colon cancer, right MCA infarct with right M1 distal occlusion in 05/2019 s/p IR with right M1 stent placement here for follow-up.  Dr Posey Pronto will leaving and patient would need to search for another rehab medicine physician to continue his care.  He has been receiving Depakote from Dr. Posey Pronto.  He has slight L hand weakness but is able to mow his lawn. He has had some forgetfulness and his decision making is a bit affetced with some cognitive impairment.  Also followed by [facility] and has an upcoming appointment.  With regards to his diabetes mellitus his sugars have been controlled and he has had no hypoglycemic episodes.  He is compliant with his medication.  His blood pressures have also been normal. He has questions about his Brilinta as Dr. Estanislado Pandy had advised him to take half of his Brilinta 90 mg but this is not reflected in his chart. He has no additional concerns today.  Last CT angiogram of the head and neck from 06/2020 revealed: IMPRESSION: Angiographically  approximately 50% intra stent stenosis of the right M1 segment.   The right vertebral artery ending in the ipsilateral PICA.   PLAN: Findings reviewed with the patient and the patient's spouse.   The patient continues to smoke about a pack a day. Otherwise, remains asymptomatic. Study is more or less stable compared to the previous arteriogram.   Follow-up CT angiogram of the head and neck in 6 months time.   He is requesting a prescription for Viagra for erectile dysfunction. Past Medical History:  Diagnosis Date   Anxiety    Bipolar disorder ([place])    Cancer ([place]) 2012   colon cancer   Closed fracture of left distal radius    Colon cancer ([place])    CVA (cerebral vascular accident) ([place]) 05/2019    right MCA territory infarction with right M1 distal occlusion status post stenting.     Depression    Diabetes ([place])    GERD (gastroesophageal reflux disease)    Hypertension    Sleep apnea    No Known Allergies  Current Outpatient Medications on File Prior to Visit  Medication Sig Dispense Refill   Accu-Chek Softclix Lancets lancets Use as instructed 100 each 12   acetaminophen (TYLENOL) 500 MG tablet Take 1,000 mg by mouth every 6 (six) hours as needed for moderate pain or headache.     aspirin 81 MG chewable tablet Chew 1 tablet (81 mg total) by mouth daily.     Blood Glucose Monitoring Suppl (ACCU-CHEK AVIVA CONNECT) w/Device KIT Test blood sugar 3 times daily 1 kit 0   divalproex (DEPAKOTE  ER) 500 MG 24 hr tablet Take 1 tablet (500 mg total) by mouth daily. 30 tablet 3   FLUoxetine (PROZAC) 20 MG capsule Take 1 capsule (20 mg total) by mouth daily. 90 capsule 3   glucose blood (ACCU-CHEK AVIVA PLUS) test strip Use as instructed 100 each 12   ibuprofen (ADVIL) 200 MG tablet Take 600-800 mg by mouth every 6 (six) hours as needed for headache or moderate pain.     Lancets Misc. (ACCU-CHEK SOFTCLIX LANCET DEV) KIT Test blood sugar 3 times daily 1 kit 0   lisinopril (ZESTRIL)  2.5 MG tablet Take 1 tablet (2.5 mg total) by mouth daily. 90 tablet 1   metFORMIN (GLUCOPHAGE) 500 MG tablet Take 0.5 tablets (250 mg total) by mouth daily with breakfast. 30 tablet 6   Misc. Devices MISC CPAP with AutoPap 8-15.  Diagnosis obstructive sleep apnea. 1 each 0   Omega-3 Fatty Acids (FISH OIL) 1200 MG CAPS Take 1,200 mg by mouth daily.     QUEtiapine (SEROQUEL) 25 MG tablet Take 1 tablet (25 mg total) by mouth at bedtime as needed (for sleep). 90 tablet 3   rosuvastatin (CRESTOR) 20 MG tablet Take 1 tablet (20 mg total) by mouth daily. (Patient taking differently: Take 20 mg by mouth every evening.) 30 tablet 12   ticagrelor (BRILINTA) 90 MG TABS tablet Take 1 tablet (90 mg total) by mouth 2 (two) times daily. (Patient taking differently: Take 45 mg by mouth 2 (two) times daily.) 60 tablet 1   No current facility-administered medications on file prior to visit.    ROS: See HPI  Observations/Objective: Awake, alert, oriented x3 Not in acute distress Neck-no JVD Respiration-normal Musculoskeletal-full range of motion Mood-normal  Lab Results  Component Value Date   HGBA1C 6.3 04/29/2020    Assessment and Plan: 1. Type 2 diabetes mellitus with other circulatory complication, without long-term current use of insulin (HCC) A1c is 6.3; goal is less than 7.0 It is reasonable to wait till his next office visit for repeat A1c Counseled on Diabetic diet, my plate method, 527 minutes of moderate intensity exercise/week Blood sugar logs with fasting goals of 80-120 mg/dl, random of less than 180 and in the event of sugars less than 60 mg/dl or greater than 400 mg/dl encouraged to notify the clinic. Advised on the need for annual eye exams, annual foot exams, Pneumonia vaccine. - metFORMIN (GLUCOPHAGE) 500 MG tablet; Take 0.5 tablets (250 mg total) by mouth daily with breakfast.  Dispense: 30 tablet; Refill: 6  2. Other male erectile dysfunction Explained pathophysiology of  erectile dysfunction to the patient - sildenafil (VIAGRA) 50 MG tablet; Take 1 tablet (50 mg total) by mouth daily as needed for erectile dysfunction. At least 24 hours between doses  Dispense: 10 tablet; Refill: 1  3. Hypertension complicating diabetes ([place]) Controlled Counseled on blood pressure goal of less than 130/80, low-sodium, DASH diet, medication compliance, 150 minutes of moderate intensity exercise per week. Discussed medication compliance, adverse effects. - lisinopril (ZESTRIL) 2.5 MG tablet; Take 1 tablet (2.5 mg total) by mouth daily.  Dispense: 90 tablet; Refill: 1  4. Cognitive deficit, post-stroke Discussed journaling to aid memory  5. History of stroke History of right MCA stroke status post stent Last angiogram of the head and neck revealed 50% in-stent stenosis Strict risk factor modification including smoking cessation, high intensity statin Advised to check with Dr. Aaron Edelman office with regards to his dosing of Brilinta Keep follow-up appointment with neurology  Follow Up Instructions: 3 months   I discussed the assessment and treatment plan with the patient. The patient was provided an opportunity to ask questions and all were answered. The patient agreed with the plan and demonstrated an understanding of the instructions.   The patient was advised to call back or seek an in-person evaluation if the symptoms worsen or if the condition fails to improve as anticipated.     I provided 26 minutes total of Telehealth time during this encounter including median intraservice time, reviewing previous notes, investigations, ordering medications, medical decision making, coordinating care and patient verbalized understanding at the end of the visit.     Charlott Rakes, MD, FAAFP. [facility], [place]   10/07/2020, 9:28 AM

## 2020-10-08 NOTE — Addendum Note (Signed)
Addended by: Douglass Rivers D on: 10/08/2020 09:07 AM   Modules accepted: Level of Service

## 2020-10-13 DIAGNOSIS — Z0271 Encounter for disability determination: Secondary | ICD-10-CM

## 2020-10-20 ENCOUNTER — Ambulatory Visit (INDEPENDENT_AMBULATORY_CARE_PROVIDER_SITE_OTHER): Payer: Medicaid Other

## 2020-10-20 DIAGNOSIS — R5383 Other fatigue: Secondary | ICD-10-CM | POA: Diagnosis not present

## 2020-10-20 DIAGNOSIS — G4733 Obstructive sleep apnea (adult) (pediatric): Secondary | ICD-10-CM | POA: Diagnosis not present

## 2020-10-20 DIAGNOSIS — I639 Cerebral infarction, unspecified: Secondary | ICD-10-CM | POA: Diagnosis not present

## 2020-10-20 DIAGNOSIS — I10 Essential (primary) hypertension: Secondary | ICD-10-CM | POA: Diagnosis not present

## 2020-10-20 DIAGNOSIS — R0683 Snoring: Secondary | ICD-10-CM | POA: Diagnosis not present

## 2020-10-20 LAB — CUP PACEART REMOTE DEVICE CHECK
Date Time Interrogation Session: 20220730215418
Implantable Pulse Generator Implant Date: 20210924

## 2020-10-23 ENCOUNTER — Other Ambulatory Visit: Payer: Self-pay | Admitting: Gastroenterology

## 2020-10-24 NOTE — Telephone Encounter (Signed)
Patient should be on dexlansoprazole not pantoprazole. Please check with patient as we are receiving refill request from pharmacy for pantoprazole.

## 2020-10-24 NOTE — Telephone Encounter (Signed)
noted 

## 2020-10-24 NOTE — Telephone Encounter (Signed)
Spoke to pt. Asked about the dexlansorprazole. He stated he is taking the dexlansoprazole and not the pantoprazole.

## 2020-11-05 ENCOUNTER — Encounter: Payer: Self-pay | Admitting: Adult Health

## 2020-11-05 ENCOUNTER — Ambulatory Visit: Payer: Medicaid Other | Admitting: Adult Health

## 2020-11-05 VITALS — BP 147/92 | HR 72 | Ht 69.0 in | Wt 246.0 lb

## 2020-11-05 DIAGNOSIS — F0630 Mood disorder due to known physiological condition, unspecified: Secondary | ICD-10-CM | POA: Diagnosis not present

## 2020-11-05 DIAGNOSIS — I10 Essential (primary) hypertension: Secondary | ICD-10-CM

## 2020-11-05 DIAGNOSIS — E785 Hyperlipidemia, unspecified: Secondary | ICD-10-CM | POA: Diagnosis not present

## 2020-11-05 DIAGNOSIS — E119 Type 2 diabetes mellitus without complications: Secondary | ICD-10-CM | POA: Diagnosis not present

## 2020-11-05 DIAGNOSIS — I639 Cerebral infarction, unspecified: Secondary | ICD-10-CM | POA: Diagnosis not present

## 2020-11-05 DIAGNOSIS — I69398 Other sequelae of cerebral infarction: Secondary | ICD-10-CM | POA: Diagnosis not present

## 2020-11-05 MED ORDER — FLUOXETINE HCL 40 MG PO CAPS: 40.0000 mg | ORAL_CAPSULE | Freq: Every day | ORAL | 5 refills | Status: AC

## 2020-11-05 NOTE — Patient Instructions (Signed)
Continue aspirin 81 mg daily  and Crestor '20mg'$  daily  for secondary stroke prevention  Continue to follow up with PCP regarding cholesterol,  and blood pressure management  Maintain strict control of hypertension with blood pressure goal below 130/90, diabetes with hemoglobin A1c goal below 7% and cholesterol with LDL cholesterol (bad cholesterol) goal below 70 mg/dL.   Increase Prozac to '40mg'$  daily, continue Depakote '500mg'$  (change to night) and continue Seroquel '25mg'$  nightly     Followup in the future with me in 6 months or call earlier if needed      Thank you for coming to see [place] at [facility]. I hope we have been able to provide you high quality care today.  You may receive a patient satisfaction survey over the next few weeks. We would appreciate your feedback and comments so that we may continue to improve ourselves and the health of our patients.

## 2020-11-05 NOTE — Progress Notes (Signed)
[facility] [address] [place]. [place] [address] [phone]       STROKE FOLLOW UP NOTE  [name] Date of Birth:  02-Jun-1960 Medical Record Number:  [id number]   Reason for Referral: stroke follow up    SUBJECTIVE:   CHIEF COMPLAINT:  Chief Complaint  Patient presents with   Follow-up    Rm 3 with spouse Scott Day Pt is well and stable, things are about the same      HPI:   Today, 11/05/2020, Scott Day returns for stroke follow up accompanied by his significant other Scott Day.  Overall stable from stroke standpoint.  Denies new stroke/TIA symptoms.  Per SO, continues to have short-term memory difficulties which she believes has been gradually declining over the past 6 months. Previously followed by Dr. Darol Destine for mood instability but has since left that practice. He reports doing well but per SO, still gets angry quickly.  Currently on Depakote 500 mg daily, fluoxetine 20 mg daily and Crestor 25 mg nightly.  Compliant on aspirin and Crestor without associated side effects.  Remains on Brilinta (decreased to 87m BID) per IR recommendations with cerebral angio (06/2020) showing approximately 50% IntraStent stenosis of right M1 segment.  Use of CPAP occassionally per SO (has difficulty at times with the seal) -managed by PCP. Loop recorder has not shown atrial fibrillation thus far.  Continued tobacco use 1-2 PPD - not ready to quit.  Continued EtOH use with syncopal event 6/28 evaluated in ED who felt likely related to alcohol abuse with ethanol level 209. Does not drink daily but at least 3x per week will drink multiple beers at a time (at least a 12 pack).  No further concerns at this time.     History provided for reference purposes only Update 05/06/2020 JM: Mr. BRadlerreturns for 611-monthtroke follow-up accompanied by his gf Scott Day.  Reports residual cognitive impairment with behaviors and mood instability Completed neurocognitive eval 10/2019, OV note  personally reviewed which showed weakness in processing speed and impairment and some executive functions, behaviorally showed signs of executive dysfunction including impulsivity and preservation with limited insight into his weakness exacerbated by substance abuse (EtOH use) He continues to follow with Dr. ZuDarol Destineoutinely for CBT Mood stabilization: Bupropion 150 mg daily, Depakote ER 250 mg daily, fluoxetine 20 mg daily and Seroquel 25 mg nightly SO reports recent worsening of mood since starting bupropion last week which was started by PCP for smoking cessation.  She is questioning ongoing need and indication of seroquel as well as possible dosage increase on fluoxetine Currently in the process of applying for SSD Denies new or worsening stroke/TIA symptoms Continued tobacco use as well as EtOH use with daily amt fluctuating (depending on what he is doing or who he is around)  Reports compliance with aspirin and Crestor Reports compliance with Brilinta s/p stent per Dr. DeEstanislado Pandy has f/u imaging in April Blood pressure today 157/90 Recent diagnosis of severe OSA -currently awaiting CPAP machine  No further concerns at this time  Update 11/06/2019 JM: Scott Day being seen for stroke follow-up accompanied by his SO, Scott Day.  Residual deficits of cognitive impairment with behaviors, left-sided weakness, occasional swallowing difficulty, dysarthria and mild headaches Greatest concern today of SO is in regards to neurocognitive disorder with behaviors consisting of frequent outbursts, agitation, irritation, delusions and lack of empathy.  Also reports increased alcohol intake of approximately 12 beers per day. SO reported increased fatigue and depression after initiating  sertraline therefore discontinued and switched to fluoxetine 20 mg daily on 8/4.  She is unable to appreciate benefit thus far. Continues Seroquel 25 mg nightly tolerating well and assist with sleeping and prior insomnia.   Completed first half of neurocognitive eval yesterday and plans on completing on Monday. Experiences headache every other day and temples bilaterally typically towards the afternoon.  At times can be debilitating where he will lay down and headache will subside after approximately 1 hour.  Denies photophobia, phonophobia or N/V.  Denies new or worsening stroke/TIA symptoms Continues aspirin 81 mg daily and Brilinta without bleeding or bruising Plans on cerebral angiogram with Dr. Estanislado Pandy next month for surveillance monitoring post stent Continues on Crestor 20 mg daily without myalgias Blood pressure today 141/84 Glucose levels stable with recent A1c 6.3 No further concerns  Initial visit 07/23/2019 JM: Scott Day is being seen for hospital follow-up accompanied by his daughter.  Residual deficits mild dysphagia, visual impairment, cognitive impairment, and left sided neglect.  Daughter also endorses mood changes such as increased agitation, lack of patience and short temper.  No prior history of depression or anxiety.  Due to lack of insurance, outpatient therapies currently on hold but continues to do HEP with ongoing improvement.   Follow-up with cardiology with plans on pursuing loop recorder placement once insurance is confirmed as currently Medicaid pending.  Continues on aspirin 325 mg daily and Brilinta 90 mg twice daily without bleeding or bruising.  Scheduled follow-up with IR 09/04/2019 for surveillance monitoring of right M1 stent.  Self discontinued atorvastatin due to myalgias with resolution after discontinuing.  Blood pressure today 140/70.  Has scheduled appointment with community health and wellness in the near future to establish care.  Hypercoagulable labs showed slightly elevated homocysteine level and positive ANA with negative lupus anticoagulant panel, beta-2 glycoprotein and cardiolipin antibodies.  No further concerns at this time.  Stroke admission 06/02/2019: Scott Day  is a 60 y.o. male with history of colon cancer in 2012 presented to [facility] on 06/04/2019 with L sided weakness.  CTA w/ R M1 occlusion w/ penumbra. Transferred to [facility] for IR.  Evaluated by stroke team with stroke work-up revealing right MCA territory infarct with right M1 distal occlusion s/p IR with right M1 stent placement, embolic secondary to unknown source.  Initially, IR mechanical thrombectomy with TICI 2C revascularization and dissection flap at site of occlusion with cerebral angio with reocclusion therefore right M1 stent placed with TICI 3 flow and advised to follow-up with IR outpatient.  Recommended consideration of loop recorder placement outpatient due to lack of insurance.  Also recommended pursuing hypercoagulable labs outpatient.  Discharged on aspirin 325 mg daily and Brilinta 90 mg twice daily for secondary stroke prevention and stent placement.  No history of HTN.  LDL 126 initiate atorvastatin 80 mg daily.  New diagnosis of DM with A1c 8.3 and recommended PCP follow-up outpatient.  Other stroke risk factors include EtOH use and obesity but no prior history of stroke.  Evaluated by therapies with residual deficits of mild dysarthria, right gaze preference, left hemianopsia versus neglect, left facial droop and dysphagia and recommended discharge to CIR for ongoing therapy needs.  Stroke:   R MCA territory infarct with right M1 distal occlusion s/p IR with R M1 stent placement, embolic secondary to unknown source Code Stroke CT head No acute abnormality. ASPECTS 10.    CTA head & neck nonocclusive distal R M1 thrombus  into bifurcation. Neck ok CT perfusion 70m core, 1237mterritory at risk IR mid/distal R M1 occlusion. Mechanical thrombectomy w/ TICI2c revascularization. Dissection flap at site of occlusion. Cerebral angio w/ reocclusion. R M1 stent placed w/ TICI3 flow. Treated w/ Integrilin gtt.  Post IR CT no hemorrhage. pathcy  cortical/subcortical R MCA infarct (frontal operculum, insula, temporoparietal jxn). Sinus dz.  Not able to have MRI due to left ankle brace CT head repeat 3/16 no ICH LE Doppler no DVT 2D Echo EF 65-70%. No source of embolus  TEE completed, no PFO, EF 65-70% Loop will be considered as outpt as pt is uninsured at this time.   Hypercoagulable labs: out pt f/u UDS neg LDL 126 HgbA1c 8.3 SCDs for VTE prophylaxis No antithrombotic prior to admission, now on aspirin 325 mg daily and Brilinta (ticagrelor) 90 mg bid following Brilinta load. Continue on discharge Therapy recommendations:  CIR       ROS:   14 system review of systems performed and negative with exception of see HPI  PMH:  Past Medical History:  Diagnosis Date   Anxiety    Bipolar disorder ([facility]   Cancer ([facility]   colon cancer   Closed fracture of left distal radius    Colon cancer ([facility]   CVA (cerebral vascular accident) ([facility]/20[place]    right MCA territory infarction with right M1 distal occlusion status post stenting.     Depression    Diabetes ([facility]   GERD (gastroesophageal reflux disease)    Hypertension    Sleep apnea     PSH:  Past Surgical History:  Procedure Laterality Date   BIOPSY  08/25/2020   Procedure: BIOPSY;  Surgeon: RoDaneil DolinMD;  [facility];  Service: Endoscopy;;   BUBBLE STUDY  06/08/2019   Procedure: BUBBLE STUDY;  Surgeon: ChBuford DresserMD;  [facility];  Service: Cardiovascular;;   COLON SURGERY  2004   colon cancer   COLONOSCOPY  2011   Dr. StFuller PlanEvidence of right hemicolectomy, polyps removed from the rectum, hyperplastic   COLONOSCOPY WITH PROPOFOL N/A 08/25/2020   Procedure: COLONOSCOPY WITH PROPOFOL;  Surgeon: RoDaneil DolinMD;  [facility];  Service: Endoscopy;  Laterality: N/A;  7:30am   ESOPHAGOGASTRODUODENOSCOPY (EGD) WITH PROPOFOL N/A 08/25/2020   Procedure: ESOPHAGOGASTRODUODENOSCOPY (EGD) WITH PROPOFOL;  Surgeon:  RoDaneil DolinMD;  [facility];  Service: Endoscopy;  Laterality: N/A;   IR ANGIO INTRA EXTRACRAN SEL COM CAROTID INNOMINATE BILAT MOD SED  01/08/2020   IR ANGIO INTRA EXTRACRAN SEL COM CAROTID INNOMINATE BILAT MOD SED  07/04/2020   IR ANGIO VERTEBRAL SEL SUBCLAVIAN INNOMINATE BILAT MOD SED  01/08/2020   IR ANGIO VERTEBRAL SEL SUBCLAVIAN INNOMINATE BILAT MOD SED  07/04/2020   IR CT HEAD LTD  06/04/2019   IR CT HEAD LTD  06/04/2019   IR INTRA CRAN STENT  06/04/2019   IR PERCUTANEOUS ART THROMBECTOMY/INFUSION INTRACRANIAL INC DIAG ANGIO  06/04/2019   IR [place] VASC ACCESS RIGHT  06/04/2019   IR [place] VASC ACCESS RIGHT  07/04/2020   OPEN REDUCTION INTERNAL FIXATION (ORIF) DISTAL RADIAL FRACTURE Left 09/20/2017   Procedure: OPEN REDUCTION INTERNAL FIXATION (ORIF) DISTAL RADIAL FRACTURE;  Surgeon: KuLeanora CoverMD;  [facility];  Laterality: Left;   POLYPECTOMY  08/25/2020   Procedure: POLYPECTOMY INTESTINAL;  Surgeon: RoDaneil DolinMD;  [facility];  Service: Endoscopy;;  RADIOLOGY WITH ANESTHESIA N/A 06/04/2019   Procedure: IR WITH ANESTHESIA;  Surgeon: Radiologist, Medication, MD;  Location: [place];  Service: Radiology;  Laterality: N/A;   TEE WITHOUT CARDIOVERSION N/A 06/08/2019   Procedure: TRANSESOPHAGEAL ECHOCARDIOGRAM (TEE);  Surgeon: Buford Dresser, MD;  Location: [facility] ENDOSCOPY;  Service: Cardiovascular;  Laterality: N/A;    Social History:  Social History   Socioeconomic History   Marital status: Divorced    Spouse name: Not on file   Number of children: Not on file   Years of education: Not on file   Highest education level: Not on file  Occupational History   Not on file  Tobacco Use   Smoking status: Every Day    Packs/day: 1.00    Years: 45.00    Pack years: 45.00    Types: Cigarettes    Start date: 07/20/2019   Smokeless tobacco: Never   Tobacco comments:    started back in April "patches did  not work"  Scientific laboratory technician Use: Never used  Substance and Sexual Activity   Alcohol use: Yes    Alcohol/week: 24.0 standard drinks    Types: 24 Cans of beer per week    Comment: 6 pk everyother day/last used 3 days ago   Drug use: Never   Sexual activity: Yes  Other Topics Concern   Not on file  Social History Narrative   Not on file   Social Determinants of Health   Financial Resource Strain: Not on file  Food Insecurity: Not on file  Transportation Needs: Not on file  Physical Activity: Not on file  Stress: Not on file  Social Connections: Not on file  Intimate Partner Violence: Not on file    Family History:  Family History  Problem Relation Age of Onset   Cancer Mother        primary unknown   Colon cancer Neg Hx     Medications:   Current Outpatient Medications on File Prior to Visit  Medication Sig Dispense Refill   Accu-Chek Softclix Lancets lancets Use as instructed 100 each 12   acetaminophen (TYLENOL) 500 MG tablet Take 1,000 mg by mouth every 6 (six) hours as needed for moderate pain or headache.     aspirin 81 MG chewable tablet Chew 1 tablet (81 mg total) by mouth daily.     Blood Glucose Monitoring Suppl (ACCU-CHEK AVIVA CONNECT) w/Device KIT Test blood sugar 3 times daily 1 kit 0   divalproex (DEPAKOTE ER) 500 MG 24 hr tablet Take 1 tablet (500 mg total) by mouth daily. 30 tablet 3   FLUoxetine (PROZAC) 20 MG capsule Take 1 capsule (20 mg total) by mouth daily. 90 capsule 3   glucose blood (ACCU-CHEK AVIVA PLUS) test strip Use as instructed 100 each 12   ibuprofen (ADVIL) 200 MG tablet Take 600-800 mg by mouth every 6 (six) hours as needed for headache or moderate pain.     Lancets Misc. (ACCU-CHEK SOFTCLIX LANCET DEV) KIT Test blood sugar 3 times daily 1 kit 0   lisinopril (ZESTRIL) 2.5 MG tablet Take 1 tablet (2.5 mg total) by mouth daily. 90 tablet 1   metFORMIN (GLUCOPHAGE) 500 MG tablet Take 0.5 tablets (250 mg total) by mouth daily with  breakfast. 30 tablet 6   Misc. Devices MISC CPAP with AutoPap 8-15.  Diagnosis obstructive sleep apnea. 1 each 0   Omega-3 Fatty Acids (FISH OIL) 1200 MG CAPS Take 1,200 mg by mouth daily.     QUEtiapine (  SEROQUEL) 25 MG tablet Take 1 tablet (25 mg total) by mouth at bedtime as needed (for sleep). 90 tablet 3   rosuvastatin (CRESTOR) 20 MG tablet Take 1 tablet (20 mg total) by mouth daily. (Patient taking differently: Take 20 mg by mouth every evening.) 30 tablet 12   ticagrelor (BRILINTA) 90 MG TABS tablet Take 1 tablet (90 mg total) by mouth 2 (two) times daily. (Patient taking differently: Take 45 mg by mouth daily. 1/2 tab) 60 tablet 1   No current facility-administered medications on file prior to visit.    Allergies:  No Known Allergies    OBJECTIVE:  Physical Exam  Vitals:   11/05/20 1028  BP: (!) 147/92  Pulse: 72  Weight: 246 lb (111.6 kg)  Height: 5' 9"  (1.753 m)    Body mass index is 36.33 kg/m. No results found.  General: well developed, well nourished,  pleasant middle-age Caucasian male, seated, in no evident distress Head: head normocephalic and atraumatic.   Neck: supple with no carotid or supraclavicular bruits Cardiovascular: regular rate and rhythm, no murmurs Musculoskeletal: no deformity Skin:  no rash/petichiae Vascular:  Normal pulses all extremities   Neurologic Exam Mental Status: Awake and fully alert. Mild dysarthria. Oriented to place and time. Recent and remote memory impaired. Attention span, concentration and fund of knowledge moderately appropriate but SO provided majority of information.  Mood and affect appropriate.  Cranial Nerves: Pupils equal, briskly reactive to light. Extraocular movements full without nystagmus. Visual fields full to confrontation. Hearing intact. Facial sensation intact.  Mild left lower facial weakness Motor: Normal bulk and tone. Normal strength in all tested extremity muscles  Sensory.: intact to touch , pinprick ,  position and vibratory sensation.  Coordination: Rapid alternating movements normal in all extremities. Finger-to-nose and heel-to-shin performed accurately bilaterally. Gait and Station: Arises from chair without difficulty. Stance is normal. Gait demonstrates normal stride length and balance without use of assistive device Reflexes: 1+ and symmetric. Toes downgoing.       ASSESSMENT: Scott Day is a 60 y.o. year old male presented with left-sided weakness on 06/04/2019 with stroke work-up revealing right MCA territory infarct with right M1 distal occlusion s/p IR with right M1 stent placement, embolic secondary to unknown source.  Recommended consideration of loop recorder placement and hypercoagulable labs outpatient.  Vascular risk factors include HTN, HLD, DM, tobacco use and EtOH use.      PLAN:  Right MCA stroke s/p IR:  Residual deficits:  Neurocognitive impairment with behaviors and mood instability and dysarthria.previously followed by neuropsych Dr. Darol Destine but he has since left that practice.   Recommend increasing fluoxetine from 20 mg to 40 mg daily.  Continue Depakote 500 mg nightly (previously taking AM).  Continue Seroquel 25 mg nightly.   Discussed importance of memory exercises as well as stopping excessive EtOH use and importance of tobacco cessation Continue aspirin 325 mg daily  and Crestor 20 mg daily for secondary stroke prevention Hypercoagulable labs negative.  Declines loop recorder placement Right M1 stent placement: 06/2020 50% IntraStent stenosis.  Repeat CTA ~12/2020.  Ongoing use of Brilinta 28m BID (decreased after angio in April per SO) - advised refills and management will need to be done by IR Discussed secondary stroke prevention measures and importance of close PCP follow-up for aggressive stroke risk factor management HTN: BP goal<130/90.  Stable.  Continue current meds per PCP HLD: LDL goal <70.  Intolerant to atorvastatin due to myalgias.  Continue  Crestor 20 mg  daily and follow-up with PCP for ongoing prescribing, monitoring and management DM: A1c goal<7. Prior A1c 6.3 (04/2020).  Continue meds per PCP Sleep apnea:  Discussed importance of nightly CPAP compliance.  Discussed ways to help improve tolerance.   Dx'd 04/21/2020  severe OSA with total AHI of 51.7/h.  Currently awaiting CPAP machine monitored/managed by PCP    Follow up in 6 months or call earlier if needed     CC:  [facility] provider: Dr. Valarie Merino, Charlane Ferretti, MD    I spent 36 minutes of face-to-face and non-face-to-face time with patient and SO. This included previsit chart review, lab review, study review, order entry, electronic health record documentation, patient education regarding prior stroke, secondary stroke prevention measures and aggressive stroke risk factor management, residual deficits including cognitive impairment and behaviors and answered all questions to patient and SO's satisfaction  Frann Rider, [facility]  [facility] [address] [place], [place] [address]  Phone [phone] Fax [phone] Note: This document was prepared with digital dictation and possible smart phrase technology. Any transcriptional errors that result from this process are unintentional.

## 2020-11-11 ENCOUNTER — Encounter: Payer: Self-pay | Admitting: Family Medicine

## 2020-11-11 ENCOUNTER — Other Ambulatory Visit: Payer: Self-pay | Admitting: Family Medicine

## 2020-11-11 NOTE — Progress Notes (Signed)
I agree with the above plan 

## 2020-11-12 ENCOUNTER — Other Ambulatory Visit: Payer: Self-pay | Admitting: Family Medicine

## 2020-11-12 MED ORDER — TADALAFIL 10 MG PO TABS: 10.0000 mg | ORAL_TABLET | ORAL | 1 refills | Status: AC | PRN

## 2020-11-12 NOTE — Progress Notes (Signed)
Carelink Summary Report / Loop Recorder 

## 2020-11-25 ENCOUNTER — Ambulatory Visit (INDEPENDENT_AMBULATORY_CARE_PROVIDER_SITE_OTHER): Payer: Medicaid Other

## 2020-11-25 DIAGNOSIS — I639 Cerebral infarction, unspecified: Secondary | ICD-10-CM | POA: Diagnosis not present

## 2020-11-25 LAB — CUP PACEART REMOTE DEVICE CHECK
Date Time Interrogation Session: 20220901214944
Implantable Pulse Generator Implant Date: 20210924

## 2020-12-02 ENCOUNTER — Encounter: Payer: Self-pay | Admitting: Adult Health

## 2020-12-02 ENCOUNTER — Other Ambulatory Visit: Payer: Self-pay | Admitting: Family Medicine

## 2020-12-02 NOTE — Telephone Encounter (Signed)
Can you please address? Thank you!

## 2020-12-02 NOTE — Telephone Encounter (Signed)
Please advise 

## 2020-12-02 NOTE — Telephone Encounter (Signed)
Requested medication (s) are due for refill today: yes  Requested medication (s) are on the active medication list:yes  Last refill: 07/23/19  #30 12 refills  Future visit scheduled no  Notes to clinic: Historical provider  Requested Prescriptions  Pending Prescriptions Disp Refills   rosuvastatin (CRESTOR) 20 MG tablet [Pharmacy Med Name: ROSUVASTATIN '20MG'$  TABLETS] 30 tablet 12    Sig: TAKE 1 TABLET BY MOUTH EVERY DAY     Cardiovascular:  Antilipid - Statins Failed - 12/02/2020  4:31 PM      Failed - HDL in normal range and within 360 days    HDL  Date Value Ref Range Status  05/06/2020 39 (L) >39 mg/dL Final          Failed - Triglycerides in normal range and within 360 days    Triglycerides  Date Value Ref Range Status  05/06/2020 204 (H) 0 - 149 mg/dL Final          Passed - Total Cholesterol in normal range and within 360 days    Cholesterol, Total  Date Value Ref Range Status  05/06/2020 163 100 - 199 mg/dL Final          Passed - LDL in normal range and within 360 days    LDL Chol Calc (NIH)  Date Value Ref Range Status  05/06/2020 89 0 - 99 mg/dL Final          Passed - Patient is not pregnant      Passed - Valid encounter within last 12 months    Recent Outpatient Visits           1 month ago Other male erectile dysfunction   [place], Charlane Ferretti, MD   7 months ago Diabetes mellitus, new onset ([place])   [place], Charlane Ferretti, MD   10 months ago Diabetes mellitus, new onset ([place])   [facility] [place], [place], MD   1 year ago Cerebrovascular accident (CVA) due to thrombosis of right middle cerebral artery [facility])    [facility] [place], Charlane Ferretti, MD

## 2020-12-03 ENCOUNTER — Telehealth: Payer: Self-pay

## 2020-12-03 NOTE — Telephone Encounter (Signed)
PA submitted via CMM- Key: [id number] - PA Case ID: [id number] - Rx #[id number]  Your information has been sent to [place] [employer].  Outcome: Approved PA Case: [id number], Status: Approved, Coverage Starts on: 12/03/2020 12:00:00 AM, Coverage Ends on: 12/03/2021 12:00:00 AM.

## 2020-12-03 NOTE — Progress Notes (Signed)
Carelink Summary Report / Loop Recorder 

## 2020-12-04 ENCOUNTER — Other Ambulatory Visit: Payer: Self-pay | Admitting: Student

## 2020-12-04 DIAGNOSIS — I63511 Cerebral infarction due to unspecified occlusion or stenosis of right middle cerebral artery: Secondary | ICD-10-CM

## 2020-12-04 MED ORDER — TICAGRELOR 90 MG PO TABS: 45.0000 mg | ORAL_TABLET | Freq: Two times a day (BID) | ORAL | 1 refills | Status: AC

## 2020-12-25 LAB — CUP PACEART REMOTE DEVICE CHECK
Date Time Interrogation Session: 20221004215009
Implantable Pulse Generator Implant Date: 20210924

## 2020-12-26 ENCOUNTER — Inpatient Hospital Stay (HOSPITAL_COMMUNITY): Payer: Medicaid Other

## 2020-12-26 ENCOUNTER — Other Ambulatory Visit: Payer: Self-pay

## 2020-12-26 ENCOUNTER — Emergency Department (HOSPITAL_COMMUNITY): Payer: Medicaid Other

## 2020-12-26 ENCOUNTER — Inpatient Hospital Stay (HOSPITAL_COMMUNITY)
Admission: EM | Admit: 2020-12-26 | Discharge: 2021-01-17 | DRG: 004 | Disposition: A | Discharge status: Rehabilitation Facility | Payer: Medicaid Other | Attending: Surgery | Admitting: Surgery

## 2020-12-26 DIAGNOSIS — Z4659 Encounter for fitting and adjustment of other gastrointestinal appliance and device: Secondary | ICD-10-CM

## 2020-12-26 DIAGNOSIS — R0602 Shortness of breath: Secondary | ICD-10-CM

## 2020-12-26 DIAGNOSIS — S069XAA Unspecified intracranial injury with loss of consciousness status unknown, initial encounter: Secondary | ICD-10-CM | POA: Diagnosis present

## 2020-12-26 DIAGNOSIS — I629 Nontraumatic intracranial hemorrhage, unspecified: Principal | ICD-10-CM

## 2020-12-26 DIAGNOSIS — M21969 Unspecified acquired deformity of unspecified lower leg: Secondary | ICD-10-CM

## 2020-12-26 DIAGNOSIS — T1490XA Injury, unspecified, initial encounter: Secondary | ICD-10-CM

## 2020-12-26 DIAGNOSIS — Z930 Tracheostomy status: Secondary | ICD-10-CM

## 2020-12-26 DIAGNOSIS — Z978 Presence of other specified devices: Secondary | ICD-10-CM

## 2020-12-26 DIAGNOSIS — K117 Disturbances of salivary secretion: Secondary | ICD-10-CM

## 2020-12-26 DIAGNOSIS — J9690 Respiratory failure, unspecified, unspecified whether with hypoxia or hypercapnia: Secondary | ICD-10-CM

## 2020-12-26 DIAGNOSIS — I10 Essential (primary) hypertension: Secondary | ICD-10-CM | POA: Diagnosis present

## 2020-12-26 DIAGNOSIS — S065XAD Traumatic subdural hemorrhage with loss of consciousness status unknown, subsequent encounter: Secondary | ICD-10-CM | POA: Diagnosis not present

## 2020-12-26 DIAGNOSIS — F319 Bipolar disorder, unspecified: Secondary | ICD-10-CM | POA: Diagnosis present

## 2020-12-26 DIAGNOSIS — S22080A Wedge compression fracture of T11-T12 vertebra, initial encounter for closed fracture: Secondary | ICD-10-CM | POA: Diagnosis not present

## 2020-12-26 DIAGNOSIS — R1310 Dysphagia, unspecified: Secondary | ICD-10-CM | POA: Diagnosis present

## 2020-12-26 DIAGNOSIS — Z20822 Contact with and (suspected) exposure to covid-19: Secondary | ICD-10-CM | POA: Diagnosis present

## 2020-12-26 DIAGNOSIS — Z8673 Personal history of transient ischemic attack (TIA), and cerebral infarction without residual deficits: Secondary | ICD-10-CM | POA: Diagnosis not present

## 2020-12-26 DIAGNOSIS — E871 Hypo-osmolality and hyponatremia: Secondary | ICD-10-CM | POA: Diagnosis not present

## 2020-12-26 DIAGNOSIS — S065XAA Traumatic subdural hemorrhage with loss of consciousness status unknown, initial encounter: Secondary | ICD-10-CM | POA: Diagnosis present

## 2020-12-26 DIAGNOSIS — J9811 Atelectasis: Secondary | ICD-10-CM | POA: Diagnosis not present

## 2020-12-26 DIAGNOSIS — S0081XA Abrasion of other part of head, initial encounter: Secondary | ICD-10-CM | POA: Diagnosis present

## 2020-12-26 DIAGNOSIS — I517 Cardiomegaly: Secondary | ICD-10-CM | POA: Diagnosis not present

## 2020-12-26 DIAGNOSIS — E785 Hyperlipidemia, unspecified: Secondary | ICD-10-CM | POA: Diagnosis present

## 2020-12-26 DIAGNOSIS — M4802 Spinal stenosis, cervical region: Secondary | ICD-10-CM | POA: Diagnosis not present

## 2020-12-26 DIAGNOSIS — Z6839 Body mass index (BMI) 39.0-39.9, adult: Secondary | ICD-10-CM | POA: Diagnosis not present

## 2020-12-26 DIAGNOSIS — S066X6A Traumatic subarachnoid hemorrhage with loss of consciousness greater than 24 hours without return to pre-existing conscious level with patient surviving, initial encounter: Principal | ICD-10-CM | POA: Diagnosis present

## 2020-12-26 DIAGNOSIS — R4182 Altered mental status, unspecified: Secondary | ICD-10-CM | POA: Diagnosis not present

## 2020-12-26 DIAGNOSIS — S59912A Unspecified injury of left forearm, initial encounter: Secondary | ICD-10-CM | POA: Diagnosis not present

## 2020-12-26 DIAGNOSIS — Z85038 Personal history of other malignant neoplasm of large intestine: Secondary | ICD-10-CM

## 2020-12-26 DIAGNOSIS — F1410 Cocaine abuse, uncomplicated: Secondary | ICD-10-CM | POA: Diagnosis present

## 2020-12-26 DIAGNOSIS — R001 Bradycardia, unspecified: Secondary | ICD-10-CM | POA: Diagnosis not present

## 2020-12-26 DIAGNOSIS — N179 Acute kidney failure, unspecified: Secondary | ICD-10-CM | POA: Diagnosis not present

## 2020-12-26 DIAGNOSIS — F10239 Alcohol dependence with withdrawal, unspecified: Secondary | ICD-10-CM | POA: Diagnosis not present

## 2020-12-26 DIAGNOSIS — G9389 Other specified disorders of brain: Secondary | ICD-10-CM | POA: Diagnosis not present

## 2020-12-26 DIAGNOSIS — S99911A Unspecified injury of right ankle, initial encounter: Secondary | ICD-10-CM | POA: Diagnosis not present

## 2020-12-26 DIAGNOSIS — F1110 Opioid abuse, uncomplicated: Secondary | ICD-10-CM | POA: Diagnosis present

## 2020-12-26 DIAGNOSIS — Z79899 Other long term (current) drug therapy: Secondary | ICD-10-CM

## 2020-12-26 DIAGNOSIS — R578 Other shock: Secondary | ICD-10-CM | POA: Diagnosis not present

## 2020-12-26 DIAGNOSIS — D181 Lymphangioma, any site: Secondary | ICD-10-CM | POA: Diagnosis present

## 2020-12-26 DIAGNOSIS — Y92410 Unspecified street and highway as the place of occurrence of the external cause: Secondary | ICD-10-CM | POA: Diagnosis not present

## 2020-12-26 DIAGNOSIS — D62 Acute posthemorrhagic anemia: Secondary | ICD-10-CM | POA: Diagnosis present

## 2020-12-26 DIAGNOSIS — E119 Type 2 diabetes mellitus without complications: Secondary | ICD-10-CM | POA: Diagnosis not present

## 2020-12-26 DIAGNOSIS — Z9911 Dependence on respirator [ventilator] status: Secondary | ICD-10-CM | POA: Diagnosis not present

## 2020-12-26 DIAGNOSIS — E870 Hyperosmolality and hypernatremia: Secondary | ICD-10-CM | POA: Diagnosis not present

## 2020-12-26 DIAGNOSIS — T82856A Stenosis of peripheral vascular stent, initial encounter: Secondary | ICD-10-CM | POA: Diagnosis present

## 2020-12-26 DIAGNOSIS — E669 Obesity, unspecified: Secondary | ICD-10-CM | POA: Diagnosis present

## 2020-12-26 DIAGNOSIS — M7989 Other specified soft tissue disorders: Secondary | ICD-10-CM | POA: Diagnosis not present

## 2020-12-26 DIAGNOSIS — E872 Acidosis, unspecified: Secondary | ICD-10-CM | POA: Diagnosis present

## 2020-12-26 DIAGNOSIS — Z23 Encounter for immunization: Secondary | ICD-10-CM

## 2020-12-26 DIAGNOSIS — S0003XA Contusion of scalp, initial encounter: Secondary | ICD-10-CM | POA: Diagnosis not present

## 2020-12-26 DIAGNOSIS — J90 Pleural effusion, not elsewhere classified: Secondary | ICD-10-CM | POA: Diagnosis not present

## 2020-12-26 DIAGNOSIS — S6992XA Unspecified injury of left wrist, hand and finger(s), initial encounter: Secondary | ICD-10-CM | POA: Diagnosis not present

## 2020-12-26 DIAGNOSIS — I2510 Atherosclerotic heart disease of native coronary artery without angina pectoris: Secondary | ICD-10-CM | POA: Diagnosis not present

## 2020-12-26 DIAGNOSIS — M50323 Other cervical disc degeneration at C6-C7 level: Secondary | ICD-10-CM | POA: Diagnosis not present

## 2020-12-26 DIAGNOSIS — G4733 Obstructive sleep apnea (adult) (pediatric): Secondary | ICD-10-CM | POA: Diagnosis present

## 2020-12-26 DIAGNOSIS — S99912A Unspecified injury of left ankle, initial encounter: Secondary | ICD-10-CM | POA: Diagnosis not present

## 2020-12-26 DIAGNOSIS — S065X6A Traumatic subdural hemorrhage with loss of consciousness greater than 24 hours without return to pre-existing conscious level with patient surviving, initial encounter: Secondary | ICD-10-CM | POA: Diagnosis present

## 2020-12-26 DIAGNOSIS — S066XAA Traumatic subarachnoid hemorrhage with loss of consciousness status unknown, initial encounter: Secondary | ICD-10-CM | POA: Diagnosis not present

## 2020-12-26 DIAGNOSIS — Z7902 Long term (current) use of antithrombotics/antiplatelets: Secondary | ICD-10-CM

## 2020-12-26 DIAGNOSIS — R000 Tachycardia, unspecified: Secondary | ICD-10-CM | POA: Diagnosis not present

## 2020-12-26 DIAGNOSIS — I480 Paroxysmal atrial fibrillation: Secondary | ICD-10-CM | POA: Diagnosis not present

## 2020-12-26 DIAGNOSIS — J9601 Acute respiratory failure with hypoxia: Secondary | ICD-10-CM | POA: Diagnosis not present

## 2020-12-26 DIAGNOSIS — S066X9A Traumatic subarachnoid hemorrhage with loss of consciousness of unspecified duration, initial encounter: Secondary | ICD-10-CM | POA: Diagnosis not present

## 2020-12-26 DIAGNOSIS — R109 Unspecified abdominal pain: Secondary | ICD-10-CM | POA: Diagnosis not present

## 2020-12-26 DIAGNOSIS — S3991XA Unspecified injury of abdomen, initial encounter: Secondary | ICD-10-CM | POA: Diagnosis not present

## 2020-12-26 DIAGNOSIS — S199XXA Unspecified injury of neck, initial encounter: Secondary | ICD-10-CM | POA: Diagnosis not present

## 2020-12-26 DIAGNOSIS — D649 Anemia, unspecified: Secondary | ICD-10-CM | POA: Diagnosis not present

## 2020-12-26 DIAGNOSIS — Z4682 Encounter for fitting and adjustment of non-vascular catheter: Secondary | ICD-10-CM | POA: Diagnosis not present

## 2020-12-26 DIAGNOSIS — Z041 Encounter for examination and observation following transport accident: Secondary | ICD-10-CM | POA: Diagnosis not present

## 2020-12-26 DIAGNOSIS — J189 Pneumonia, unspecified organism: Secondary | ICD-10-CM | POA: Diagnosis not present

## 2020-12-26 DIAGNOSIS — S32010A Wedge compression fracture of first lumbar vertebra, initial encounter for closed fracture: Secondary | ICD-10-CM | POA: Diagnosis not present

## 2020-12-26 DIAGNOSIS — J3489 Other specified disorders of nose and nasal sinuses: Secondary | ICD-10-CM | POA: Diagnosis not present

## 2020-12-26 DIAGNOSIS — I609 Nontraumatic subarachnoid hemorrhage, unspecified: Secondary | ICD-10-CM | POA: Diagnosis not present

## 2020-12-26 DIAGNOSIS — M5031 Other cervical disc degeneration,  high cervical region: Secondary | ICD-10-CM | POA: Diagnosis not present

## 2020-12-26 DIAGNOSIS — R402432 Glasgow coma scale score 3-8, at arrival to emergency department: Secondary | ICD-10-CM | POA: Diagnosis present

## 2020-12-26 DIAGNOSIS — I4891 Unspecified atrial fibrillation: Secondary | ICD-10-CM | POA: Diagnosis not present

## 2020-12-26 DIAGNOSIS — Y908 Blood alcohol level of 240 mg/100 ml or more: Secondary | ICD-10-CM | POA: Diagnosis present

## 2020-12-26 DIAGNOSIS — Z7984 Long term (current) use of oral hypoglycemic drugs: Secondary | ICD-10-CM

## 2020-12-26 DIAGNOSIS — S6991XA Unspecified injury of right wrist, hand and finger(s), initial encounter: Secondary | ICD-10-CM | POA: Diagnosis not present

## 2020-12-26 DIAGNOSIS — I639 Cerebral infarction, unspecified: Secondary | ICD-10-CM | POA: Diagnosis not present

## 2020-12-26 DIAGNOSIS — S0511XA Contusion of eyeball and orbital tissues, right eye, initial encounter: Secondary | ICD-10-CM | POA: Diagnosis not present

## 2020-12-26 DIAGNOSIS — Z22322 Carrier or suspected carrier of Methicillin resistant Staphylococcus aureus: Secondary | ICD-10-CM

## 2020-12-26 DIAGNOSIS — S066X0A Traumatic subarachnoid hemorrhage without loss of consciousness, initial encounter: Secondary | ICD-10-CM | POA: Diagnosis not present

## 2020-12-26 DIAGNOSIS — S06890A Other specified intracranial injury without loss of consciousness, initial encounter: Secondary | ICD-10-CM | POA: Diagnosis not present

## 2020-12-26 LAB — RAPID URINE DRUG SCREEN, HOSP PERFORMED
Amphetamines: NOT DETECTED
Barbiturates: NOT DETECTED
Benzodiazepines: NOT DETECTED
Cocaine: POSITIVE — AB
Opiates: POSITIVE — AB
Tetrahydrocannabinol: NOT DETECTED

## 2020-12-26 LAB — URINALYSIS, ROUTINE W REFLEX MICROSCOPIC
Bacteria, UA: NONE SEEN
Bilirubin Urine: NEGATIVE
Glucose, UA: >=500 mg/dL — AB
Hgb urine dipstick: NEGATIVE
Ketones, ur: 5 mg/dL — AB
Leukocytes,Ua: NEGATIVE
Nitrite: NEGATIVE
Protein, ur: 30 mg/dL — AB
Specific Gravity, Urine: 1.033 — ABNORMAL HIGH (ref 1.005–1.030)
pH: 5 (ref 5.0–8.0)

## 2020-12-26 LAB — TYPE AND SCREEN
ABO/RH(D): O POS
Antibody Screen: NEGATIVE

## 2020-12-26 LAB — I-STAT CHEM 8, ED
BUN: 11 mg/dL (ref 6–20)
Calcium, Ion: 1 mmol/L — ABNORMAL LOW (ref 1.15–1.40)
Chloride: 94 mmol/L — ABNORMAL LOW (ref 98–111)
Creatinine, Ser: 1.3 mg/dL — ABNORMAL HIGH (ref 0.61–1.24)
Glucose, Bld: 196 mg/dL — ABNORMAL HIGH (ref 70–99)
HCT: 41 % (ref 39.0–52.0)
Hemoglobin: 13.9 g/dL (ref 13.0–17.0)
Potassium: 4.6 mmol/L (ref 3.5–5.1)
Sodium: 132 mmol/L — ABNORMAL LOW (ref 135–145)
TCO2: 27 mmol/L (ref 22–32)

## 2020-12-26 LAB — I-STAT ARTERIAL BLOOD GAS, ED
Acid-Base Excess: 1 mmol/L (ref 0.0–2.0)
Bicarbonate: 26.1 mmol/L (ref 20.0–28.0)
Calcium, Ion: 1.08 mmol/L — ABNORMAL LOW (ref 1.15–1.40)
HCT: 34 % — ABNORMAL LOW (ref 39.0–52.0)
Hemoglobin: 11.6 g/dL — ABNORMAL LOW (ref 13.0–17.0)
O2 Saturation: 100 %
Patient temperature: 96.8
Potassium: 3.7 mmol/L (ref 3.5–5.1)
Sodium: 131 mmol/L — ABNORMAL LOW (ref 135–145)
TCO2: 27 mmol/L (ref 22–32)
pCO2 arterial: 39.1 mmHg (ref 32.0–48.0)
pH, Arterial: 7.429 (ref 7.350–7.450)
pO2, Arterial: 293 mmHg — ABNORMAL HIGH (ref 83.0–108.0)

## 2020-12-26 LAB — ETHANOL: Alcohol, Ethyl (B): 251 mg/dL — ABNORMAL HIGH (ref <10)

## 2020-12-26 LAB — CBC
HCT: 39.6 % (ref 39.0–52.0)
Hemoglobin: 13.6 g/dL (ref 13.0–17.0)
MCH: 32.2 pg (ref 26.0–34.0)
MCHC: 34.3 g/dL (ref 30.0–36.0)
MCV: 93.6 fL (ref 80.0–100.0)
Platelets: 200 10*3/uL (ref 150–400)
RBC: 4.23 MIL/uL (ref 4.22–5.81)
RDW: 12 % (ref 11.5–15.5)
WBC: 11.5 10*3/uL — ABNORMAL HIGH (ref 4.0–10.5)
nRBC: 0 % (ref 0.0–0.2)

## 2020-12-26 LAB — HEMOGLOBIN A1C
Hgb A1c MFr Bld: 8 % — ABNORMAL HIGH (ref 4.8–5.6)
Mean Plasma Glucose: 182.9 mg/dL

## 2020-12-26 LAB — ABO/RH: ABO/RH(D): O POS

## 2020-12-26 LAB — SAMPLE TO BLOOD BANK

## 2020-12-26 LAB — RESP PANEL BY RT-PCR (FLU A&B, COVID) ARPGX2
Influenza A by PCR: NEGATIVE
Influenza B by PCR: NEGATIVE
SARS Coronavirus 2 by RT PCR: NEGATIVE

## 2020-12-26 LAB — HIV ANTIBODY (ROUTINE TESTING W REFLEX): HIV Screen 4th Generation wRfx: NONREACTIVE

## 2020-12-26 LAB — LACTIC ACID, PLASMA: Lactic Acid, Venous: 3 mmol/L (ref 0.5–1.9)

## 2020-12-26 MED ORDER — FENTANYL BOLUS VIA INFUSION
50.0000 ug | INTRAVENOUS | Status: DC | PRN
  Filled 2020-12-26: qty 100

## 2020-12-26 MED ORDER — INSULIN ASPART 100 UNIT/ML IJ SOLN
0.0000 [IU] | INTRAMUSCULAR | Status: DC
  Administered 2020-12-27: 3 [IU] via SUBCUTANEOUS
  Administered 2020-12-27 (×2): 2 [IU] via SUBCUTANEOUS
  Administered 2020-12-27 (×2): 3 [IU] via SUBCUTANEOUS
  Administered 2020-12-27 – 2020-12-30 (×8): 2 [IU] via SUBCUTANEOUS
  Administered 2020-12-30 (×3): 3 [IU] via SUBCUTANEOUS
  Administered 2020-12-30 – 2020-12-31 (×3): 2 [IU] via SUBCUTANEOUS
  Administered 2020-12-31: 5 [IU] via SUBCUTANEOUS
  Administered 2020-12-31: 2 [IU] via SUBCUTANEOUS
  Administered 2020-12-31 – 2021-01-01 (×3): 3 [IU] via SUBCUTANEOUS
  Administered 2021-01-01 (×3): 5 [IU] via SUBCUTANEOUS
  Administered 2021-01-01 (×2): 3 [IU] via SUBCUTANEOUS
  Administered 2021-01-02 – 2021-01-03 (×7): 5 [IU] via SUBCUTANEOUS
  Administered 2021-01-03 (×2): 3 [IU] via SUBCUTANEOUS
  Administered 2021-01-03 – 2021-01-04 (×10): 5 [IU] via SUBCUTANEOUS
  Administered 2021-01-05: 2 [IU] via SUBCUTANEOUS
  Administered 2021-01-05: 3 [IU] via SUBCUTANEOUS
  Administered 2021-01-05: 2 [IU] via SUBCUTANEOUS
  Administered 2021-01-05 (×2): 5 [IU] via SUBCUTANEOUS
  Administered 2021-01-06 (×2): 3 [IU] via SUBCUTANEOUS
  Administered 2021-01-06 (×2): 2 [IU] via SUBCUTANEOUS
  Administered 2021-01-06 (×2): 3 [IU] via SUBCUTANEOUS
  Administered 2021-01-06 – 2021-01-07 (×2): 2 [IU] via SUBCUTANEOUS
  Administered 2021-01-07: 5 [IU] via SUBCUTANEOUS
  Administered 2021-01-07 – 2021-01-08 (×4): 3 [IU] via SUBCUTANEOUS
  Administered 2021-01-08: 8 [IU] via SUBCUTANEOUS
  Administered 2021-01-08: 3 [IU] via SUBCUTANEOUS
  Administered 2021-01-08: 8 [IU] via SUBCUTANEOUS
  Administered 2021-01-08 (×2): 5 [IU] via SUBCUTANEOUS
  Administered 2021-01-08: 3 [IU] via SUBCUTANEOUS
  Administered 2021-01-09: 2 [IU] via SUBCUTANEOUS
  Administered 2021-01-09: 5 [IU] via SUBCUTANEOUS
  Administered 2021-01-09: 2 [IU] via SUBCUTANEOUS
  Administered 2021-01-09 (×2): 3 [IU] via SUBCUTANEOUS
  Administered 2021-01-10: 5 [IU] via SUBCUTANEOUS
  Administered 2021-01-10 (×4): 3 [IU] via SUBCUTANEOUS
  Administered 2021-01-10: 5 [IU] via SUBCUTANEOUS
  Administered 2021-01-11 (×2): 3 [IU] via SUBCUTANEOUS
  Administered 2021-01-11 (×2): 5 [IU] via SUBCUTANEOUS
  Administered 2021-01-11 (×2): 3 [IU] via SUBCUTANEOUS
  Administered 2021-01-12 (×4): 5 [IU] via SUBCUTANEOUS
  Administered 2021-01-12: 3 [IU] via SUBCUTANEOUS
  Administered 2021-01-12 (×2): 5 [IU] via SUBCUTANEOUS
  Administered 2021-01-13: 3 [IU] via SUBCUTANEOUS
  Administered 2021-01-13: 5 [IU] via SUBCUTANEOUS
  Administered 2021-01-13: 3 [IU] via SUBCUTANEOUS
  Administered 2021-01-13 (×2): 5 [IU] via SUBCUTANEOUS
  Administered 2021-01-14 (×2): 3 [IU] via SUBCUTANEOUS
  Administered 2021-01-14: 5 [IU] via SUBCUTANEOUS
  Administered 2021-01-14 (×3): 3 [IU] via SUBCUTANEOUS
  Administered 2021-01-15: 5 [IU] via SUBCUTANEOUS
  Administered 2021-01-15 (×2): 3 [IU] via SUBCUTANEOUS
  Administered 2021-01-15 – 2021-01-16 (×5): 5 [IU] via SUBCUTANEOUS
  Administered 2021-01-16: 3 [IU] via SUBCUTANEOUS
  Administered 2021-01-16: 5 [IU] via SUBCUTANEOUS
  Administered 2021-01-16: 3 [IU] via SUBCUTANEOUS
  Administered 2021-01-16 – 2021-01-17 (×2): 5 [IU] via SUBCUTANEOUS
  Administered 2021-01-17: 8 [IU] via SUBCUTANEOUS
  Administered 2021-01-17: 3 [IU] via SUBCUTANEOUS

## 2020-12-26 MED ORDER — ONDANSETRON HCL 4 MG/2ML IJ SOLN
4.0000 mg | Freq: Four times a day (QID) | INTRAMUSCULAR | Status: DC | PRN
  Administered 2021-01-01: 4 mg via INTRAVENOUS
  Filled 2020-12-26: qty 2

## 2020-12-26 MED ORDER — FENTANYL CITRATE PF 50 MCG/ML IJ SOSY
50.0000 ug | PREFILLED_SYRINGE | Freq: Once | INTRAMUSCULAR | Status: AC
Start: 2020-12-26 — End: 2020-12-26
  Administered 2020-12-26: 50 ug via INTRAVENOUS

## 2020-12-26 MED ORDER — FENTANYL CITRATE PF 50 MCG/ML IJ SOSY
100.0000 ug | PREFILLED_SYRINGE | Freq: Once | INTRAMUSCULAR | Status: AC
  Administered 2020-12-26: 100 ug via INTRAVENOUS
  Filled 2020-12-26: qty 2

## 2020-12-26 MED ORDER — PROPOFOL 1000 MG/100ML IV EMUL
5.0000 ug/kg/min | INTRAVENOUS | Status: DC
  Administered 2020-12-26: 15 ug/kg/min via INTRAVENOUS

## 2020-12-26 MED ORDER — TETANUS-DIPHTH-ACELL PERTUSSIS 5-2.5-18.5 LF-MCG/0.5 IM SUSY
0.5000 mL | PREFILLED_SYRINGE | Freq: Once | INTRAMUSCULAR | Status: AC
  Administered 2020-12-26: 0.5 mL via INTRAMUSCULAR

## 2020-12-26 MED ORDER — FENTANYL 2500MCG IN NS 250ML (10MCG/ML) PREMIX INFUSION
50.0000 ug/h | INTRAVENOUS | Status: DC
  Administered 2020-12-26: 100 ug/h via INTRAVENOUS
  Filled 2020-12-26: qty 250

## 2020-12-26 MED ORDER — POLYETHYLENE GLYCOL 3350 17 G PO PACK
17.0000 g | PACK | Freq: Every day | ORAL | Status: DC
  Administered 2020-12-30 – 2021-01-08 (×8): 17 g
  Filled 2020-12-26 (×10): qty 1

## 2020-12-26 MED ORDER — FENTANYL 2500MCG IN NS 250ML (10MCG/ML) PREMIX INFUSION
50.0000 ug/h | INTRAVENOUS | Status: DC
  Administered 2020-12-27: 150 ug/h via INTRAVENOUS
  Filled 2020-12-26 (×2): qty 250

## 2020-12-26 MED ORDER — PROPOFOL 1000 MG/100ML IV EMUL
0.0000 ug/kg/min | INTRAVENOUS | Status: DC
Start: 2020-12-26 — End: 2020-12-28

## 2020-12-26 MED ORDER — FENTANYL BOLUS VIA INFUSION
50.0000 ug | INTRAVENOUS | Status: DC | PRN
  Administered 2020-12-27 – 2020-12-28 (×4): 50 ug via INTRAVENOUS
  Filled 2020-12-26: qty 100

## 2020-12-26 MED ORDER — IOHEXOL 300 MG/ML  SOLN
100.0000 mL | Freq: Once | INTRAMUSCULAR | Status: AC | PRN
  Administered 2020-12-26: 100 mL via INTRAVENOUS

## 2020-12-26 MED ORDER — FENTANYL CITRATE PF 50 MCG/ML IJ SOSY
PREFILLED_SYRINGE | INTRAMUSCULAR | Status: AC
  Filled 2020-12-26: qty 1

## 2020-12-26 MED ORDER — ETOMIDATE 2 MG/ML IV SOLN
INTRAVENOUS | Status: AC | PRN
Start: 2020-12-26 — End: 2020-12-26
  Administered 2020-12-26: 20 mg via INTRAVENOUS

## 2020-12-26 MED ORDER — POTASSIUM CHLORIDE IN NACL 20-0.9 MEQ/L-% IV SOLN
INTRAVENOUS | Status: DC
  Filled 2020-12-26 (×8): qty 1000

## 2020-12-26 MED ORDER — METOPROLOL TARTRATE 5 MG/5ML IV SOLN
5.0000 mg | Freq: Four times a day (QID) | INTRAVENOUS | Status: DC | PRN
Start: 2020-12-26 — End: 2021-01-02

## 2020-12-26 MED ORDER — DOCUSATE SODIUM 50 MG/5ML PO LIQD
100.0000 mg | Freq: Two times a day (BID) | ORAL | Status: DC
  Administered 2020-12-27 – 2021-01-09 (×22): 100 mg
  Filled 2020-12-26 (×25): qty 10

## 2020-12-26 MED ORDER — ONDANSETRON 4 MG PO TBDP: 4.0000 mg | ORAL_TABLET | Freq: Four times a day (QID) | ORAL | Status: DC | PRN

## 2020-12-26 MED ORDER — SODIUM CHLORIDE 0.9% IV SOLUTION: Freq: Once | INTRAVENOUS | Status: AC

## 2020-12-26 MED ORDER — PANTOPRAZOLE SODIUM 40 MG PO TBEC: 40.0000 mg | DELAYED_RELEASE_TABLET | Freq: Every day | ORAL | Status: DC

## 2020-12-26 MED ORDER — CEFAZOLIN SODIUM-DEXTROSE 2-4 GM/100ML-% IV SOLN
2.0000 g | Freq: Once | INTRAVENOUS | Status: AC
  Administered 2020-12-26: 2 g via INTRAVENOUS

## 2020-12-26 MED ORDER — PANTOPRAZOLE SODIUM 40 MG IV SOLR
40.0000 mg | Freq: Every day | INTRAVENOUS | Status: DC
  Administered 2020-12-27: 40 mg via INTRAVENOUS
  Filled 2020-12-26: qty 40

## 2020-12-26 MED ORDER — ROCURONIUM BROMIDE 50 MG/5ML IV SOLN
INTRAVENOUS | Status: AC | PRN
  Administered 2020-12-26: 100 mg via INTRAVENOUS

## 2020-12-26 MED ORDER — LEVETIRACETAM IN NACL 500 MG/100ML IV SOLN
500.0000 mg | Freq: Two times a day (BID) | INTRAVENOUS | Status: AC
Start: 2020-12-26 — End: 2021-01-02
  Administered 2020-12-26 – 2021-01-02 (×14): 500 mg via INTRAVENOUS
  Filled 2020-12-26 (×15): qty 100

## 2020-12-26 NOTE — TOC CAGE-AID Note (Signed)
Transition of Care [facility]) - CAGE-AID Screening   Patient Details  Name: Scott Day MRN: [id number] Date of Birth: 1960-12-01  [facility] CM/SW Contact:    Dia Crawford, RN Phone Number: 12/26/2020, 8:09 PM   Clinical Narrative: Pt intubated, unable to interview   CAGE-AID Screening:

## 2020-12-26 NOTE — ED Notes (Signed)
NS and TS speaking with family at bedside

## 2020-12-26 NOTE — ED Notes (Signed)
Secondary Contact Son Vilma Meckel can be reached at [phone].

## 2020-12-26 NOTE — ED Notes (Signed)
Blue top lab sample recollected and sent

## 2020-12-26 NOTE — ED Notes (Signed)
Consult neurosurgery
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
## 2020-12-26 NOTE — Progress Notes (Signed)
Orthopedic Tech Progress Note Patient Details:  Scott Day 03/22/1875 [id number]  Patient ID: Scott Day, male   DOB: 03/22/1875, 60 y.o.   MRN: [id number] Level 1 trauma not needed at the moment.  Edwina Barth 12/26/2020, 6:57 PM
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
## 2020-12-26 NOTE — ED Notes (Signed)
Heather Whobrey, pt's daughter, to be primary contact. She can be reached at [phone].
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
## 2020-12-26 NOTE — ED Notes (Signed)
Belongings provided to daughter [name] - [phone]. And change in cash, pocket knife x 2, fingernail clippers, tools x 4, keys x 3 with bottle opener. Cash verified with B.[place], CN
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
## 2020-12-26 NOTE — ED Notes (Addendum)
SO Scott Day [phone] contacted, she is in [place] and will be enroute to ED asap. Dr. Grandville Silos notified

## 2020-12-27 ENCOUNTER — Inpatient Hospital Stay (HOSPITAL_COMMUNITY): Payer: Medicaid Other

## 2020-12-27 LAB — GLUCOSE, CAPILLARY
Glucose-Capillary: 115 mg/dL — ABNORMAL HIGH (ref 70–99)
Glucose-Capillary: 132 mg/dL — ABNORMAL HIGH (ref 70–99)
Glucose-Capillary: 133 mg/dL — ABNORMAL HIGH (ref 70–99)
Glucose-Capillary: 144 mg/dL — ABNORMAL HIGH (ref 70–99)
Glucose-Capillary: 151 mg/dL — ABNORMAL HIGH (ref 70–99)
Glucose-Capillary: 152 mg/dL — ABNORMAL HIGH (ref 70–99)
Glucose-Capillary: 173 mg/dL — ABNORMAL HIGH (ref 70–99)

## 2020-12-27 LAB — COMPREHENSIVE METABOLIC PANEL
ALT: 21 U/L (ref 0–44)
AST: 24 U/L (ref 15–41)
Albumin: 3.3 g/dL — ABNORMAL LOW (ref 3.5–5.0)
Alkaline Phosphatase: 60 U/L (ref 38–126)
Anion gap: 12 (ref 5–15)
BUN: 7 mg/dL (ref 6–20)
CO2: 23 mmol/L (ref 22–32)
Calcium: 8 mg/dL — ABNORMAL LOW (ref 8.9–10.3)
Chloride: 98 mmol/L (ref 98–111)
Creatinine, Ser: 0.84 mg/dL (ref 0.61–1.24)
GFR, Estimated: >60 mL/min (ref >60)
Glucose, Bld: 150 mg/dL — ABNORMAL HIGH (ref 70–99)
Potassium: 3.7 mmol/L (ref 3.5–5.1)
Sodium: 133 mmol/L — ABNORMAL LOW (ref 135–145)
Total Bilirubin: 0.8 mg/dL (ref 0.3–1.2)
Total Protein: 5.6 g/dL — ABNORMAL LOW (ref 6.5–8.1)

## 2020-12-27 LAB — CBC
HCT: 32.9 % — ABNORMAL LOW (ref 39.0–52.0)
Hemoglobin: 11.2 g/dL — ABNORMAL LOW (ref 13.0–17.0)
MCH: 31.9 pg (ref 26.0–34.0)
MCHC: 34 g/dL (ref 30.0–36.0)
MCV: 93.7 fL (ref 80.0–100.0)
Platelets: 209 10*3/uL (ref 150–400)
RBC: 3.51 MIL/uL — ABNORMAL LOW (ref 4.22–5.81)
RDW: 12 % (ref 11.5–15.5)
WBC: 8.3 10*3/uL (ref 4.0–10.5)
nRBC: 0 % (ref 0.0–0.2)

## 2020-12-27 LAB — PROTIME-INR
INR: 1.1 (ref 0.8–1.2)
Prothrombin Time: 13.9 seconds (ref 11.4–15.2)

## 2020-12-27 LAB — TRIGLYCERIDES: Triglycerides: 193 mg/dL — ABNORMAL HIGH (ref <150)

## 2020-12-27 LAB — MRSA NEXT GEN BY PCR, NASAL: MRSA by PCR Next Gen: DETECTED — AB

## 2020-12-27 MED ORDER — SODIUM CHLORIDE 0.9 % IV BOLUS
250.0000 mL | Freq: Once | INTRAVENOUS | Status: AC
  Administered 2020-12-27: 250 mL via INTRAVENOUS

## 2020-12-27 MED ORDER — WHITE PETROLATUM EX OINT
TOPICAL_OINTMENT | CUTANEOUS | Status: AC
  Administered 2020-12-27: 0.2
  Filled 2020-12-27: qty 28.35

## 2020-12-27 MED ORDER — CHLORHEXIDINE GLUCONATE CLOTH 2 % EX PADS
6.0000 | MEDICATED_PAD | Freq: Every day | CUTANEOUS | Status: DC
  Administered 2020-12-28 – 2021-01-17 (×21): 6 via TOPICAL

## 2020-12-27 MED ORDER — MUPIROCIN 2 % EX OINT
1.0000 "application " | TOPICAL_OINTMENT | Freq: Two times a day (BID) | CUTANEOUS | Status: AC
  Administered 2020-12-27 – 2020-12-31 (×10): 1 via NASAL
  Filled 2020-12-27: qty 22

## 2020-12-27 MED ORDER — FOLIC ACID 1 MG PO TABS: 1.0000 mg | ORAL_TABLET | Freq: Every day | ORAL | Status: DC

## 2020-12-27 MED ORDER — ALBUMIN HUMAN 5 % IV SOLN: 25.0000 g | Freq: Once | INTRAVENOUS | Status: DC

## 2020-12-27 MED ORDER — ADULT MULTIVITAMIN W/MINERALS CH: 1.0000 | ORAL_TABLET | Freq: Every day | ORAL | Status: DC

## 2020-12-27 MED ORDER — ORAL CARE MOUTH RINSE
15.0000 mL | OROMUCOSAL | Status: DC
  Administered 2020-12-27 – 2021-01-11 (×146): 15 mL via OROMUCOSAL

## 2020-12-27 MED ORDER — LORAZEPAM 2 MG/ML IJ SOLN: 1.0000 mg | INTRAMUSCULAR | Status: AC | PRN

## 2020-12-27 MED ORDER — FOLIC ACID 1 MG PO TABS
1.0000 mg | ORAL_TABLET | Freq: Every day | ORAL | Status: DC
  Administered 2020-12-27 – 2021-01-17 (×22): 1 mg
  Filled 2020-12-27 (×22): qty 1

## 2020-12-27 MED ORDER — ALBUMIN HUMAN 25 % IV SOLN
12.5000 g | Freq: Once | INTRAVENOUS | Status: AC
  Administered 2020-12-27: 12.5 g via INTRAVENOUS
  Filled 2020-12-27: qty 50

## 2020-12-27 MED ORDER — THIAMINE HCL 100 MG PO TABS
100.0000 mg | ORAL_TABLET | Freq: Every day | ORAL | Status: DC
  Administered 2020-12-28 – 2021-01-17 (×21): 100 mg
  Filled 2020-12-27 (×21): qty 1

## 2020-12-27 MED ORDER — CHLORHEXIDINE GLUCONATE 0.12% ORAL RINSE (MEDLINE KIT)
15.0000 mL | Freq: Two times a day (BID) | OROMUCOSAL | Status: DC
  Administered 2020-12-27 – 2021-01-11 (×30): 15 mL via OROMUCOSAL

## 2020-12-27 MED ORDER — DEXMEDETOMIDINE HCL IN NACL 400 MCG/100ML IV SOLN
0.0000 ug/kg/h | INTRAVENOUS | Status: DC
  Administered 2020-12-27 – 2020-12-29 (×3): 0.4 ug/kg/h via INTRAVENOUS
  Administered 2020-12-30: 0.2 ug/kg/h via INTRAVENOUS
  Administered 2020-12-31: 0.3 ug/kg/h via INTRAVENOUS
  Administered 2020-12-31 – 2021-01-01 (×2): 0.2 ug/kg/h via INTRAVENOUS
  Administered 2021-01-01: 0.4 ug/kg/h via INTRAVENOUS
  Administered 2021-01-02: 0.7 ug/kg/h via INTRAVENOUS
  Administered 2021-01-02: 0.4 ug/kg/h via INTRAVENOUS
  Administered 2021-01-02: 0.5 ug/kg/h via INTRAVENOUS
  Administered 2021-01-03 (×3): 0.7 ug/kg/h via INTRAVENOUS
  Administered 2021-01-03: 0.5 ug/kg/h via INTRAVENOUS
  Administered 2021-01-03 – 2021-01-04 (×2): 0.7 ug/kg/h via INTRAVENOUS
  Administered 2021-01-04 – 2021-01-05 (×2): 0.4 ug/kg/h via INTRAVENOUS
  Administered 2021-01-06: 0.6 ug/kg/h via INTRAVENOUS
  Administered 2021-01-06: 0.4 ug/kg/h via INTRAVENOUS
  Administered 2021-01-06: 0.1 ug/kg/h via INTRAVENOUS
  Administered 2021-01-07: 0.4 ug/kg/h via INTRAVENOUS
  Administered 2021-01-07: 0.7 ug/kg/h via INTRAVENOUS
  Administered 2021-01-07 – 2021-01-08 (×2): 0.4 ug/kg/h via INTRAVENOUS
  Filled 2020-12-27 (×27): qty 100

## 2020-12-27 MED ORDER — THIAMINE HCL 100 MG/ML IJ SOLN: 100.0000 mg | Freq: Every day | INTRAMUSCULAR | Status: DC

## 2020-12-27 MED ORDER — LORAZEPAM 1 MG PO TABS: 1.0000 mg | ORAL_TABLET | ORAL | Status: AC | PRN

## 2020-12-27 MED ORDER — THIAMINE HCL 100 MG PO TABS
100.0000 mg | ORAL_TABLET | Freq: Every day | ORAL | Status: DC
  Administered 2020-12-27: 100 mg via ORAL
  Filled 2020-12-27: qty 1

## 2020-12-27 MED ORDER — PANTOPRAZOLE 2 MG/ML SUSPENSION
40.0000 mg | Freq: Every day | ORAL | Status: DC
  Administered 2020-12-27 – 2021-01-12 (×17): 40 mg
  Filled 2020-12-27 (×17): qty 20

## 2020-12-27 MED ORDER — ADULT MULTIVITAMIN W/MINERALS CH
1.0000 | ORAL_TABLET | Freq: Every day | ORAL | Status: DC
  Administered 2020-12-27 – 2021-01-17 (×22): 1
  Filled 2020-12-27 (×22): qty 1
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
## 2020-12-27 NOTE — Progress Notes (Signed)
Upper dentures removed. Placed in denture cup with pt label and sat on computer. Family aware

## 2020-12-27 NOTE — Progress Notes (Addendum)
Patient ID: Scott Day, male   DOB: Oct 30, 1960, 60 y.o.   MRN: [id number] Follow up - Trauma Critical Care  Patient Details:    Scott Day is an 60 y.o. male.  Lines/tubes : Airway 7.5 mm (Active)  Secured at (cm) 24 cm 12/27/20 0803  Measured From Lips 12/27/20 0803  Secured Location Left 12/27/20 0803  Secured By [employer] 12/27/20 0803  Tube Holder Repositioned Yes 12/27/20 0803  Prone position No 12/27/20 0315  Site Condition Dry 12/27/20 0803     Airway (Active)     NG/OG Vented/Dual Lumen 18 Fr. Oral (Active)  Tube Position (Required) External length of tube 12/27/20 0003  Measurement (cm) (Required) 49 cm 12/27/20 0003  Ongoing Placement Verification (Required) (See row information) Yes 12/27/20 0800  Site Assessment Clean;Dry;Intact 12/27/20 0800  Interventions Clamped 12/27/20 0003  Status Clamped 12/27/20 0800  Drainage Appearance Cloudy;Yellow 12/26/20 1942  Output (mL) 625 mL 12/26/20 1942     Urethral Catheter [place] RN Temperature probe 16 Fr. (Active)  Indication for Insertion or Continuance of Catheter Unstable critically ill patients first 24-48 hours (See Criteria) 12/27/20 0800  Site Assessment Clean;Intact 12/27/20 0800  Catheter Maintenance Bag below level of bladder;Catheter secured;Drainage bag/tubing not touching floor;Insertion date on drainage bag;No dependent loops;Seal intact 12/27/20 0800  Collection Container Standard drainage bag 12/27/20 0800  Securement Method Leg strap 12/27/20 0800  Output (mL) 30 mL 12/27/20 0600    Microbiology/Sepsis markers: Results for orders placed or performed during the hospital encounter of 12/26/20  Resp Panel by RT-PCR (Flu A&B, Covid) Nasopharyngeal Swab     Status: None   Collection Time: 12/26/20  6:55 PM   Specimen: Nasopharyngeal Swab; Nasopharyngeal(NP) swabs in vial transport medium  Result Value Ref Range Status   SARS Coronavirus 2 by RT PCR NEGATIVE NEGATIVE Final     Comment: (NOTE) SARS-CoV-2 target nucleic acids are NOT DETECTED.  The SARS-CoV-2 RNA is generally detectable in upper respiratory specimens during the acute phase of infection. The lowest concentration of SARS-CoV-2 viral copies this assay can detect is 138 copies/mL. A negative result does not preclude SARS-Cov-2 infection and should not be used as the sole basis for treatment or other patient management decisions. A negative result may occur with  improper specimen collection/handling, submission of specimen other than nasopharyngeal swab, presence of viral mutation(s) within the areas targeted by this assay, and inadequate number of viral copies(<138 copies/mL). A negative result must be combined with clinical observations, patient history, and epidemiological information. The expected result is Negative.  Fact Sheet for Patients:  EntrepreneurPulse.com.au  Fact Sheet for Healthcare Providers:  IncredibleEmployment.be  This test is no t yet approved or cleared by the [place] FDA and  has been authorized for detection and/or diagnosis of SARS-CoV-2 by FDA under an Emergency Use Authorization (EUA). This EUA will remain  in effect (meaning this test can be used) for the duration of the COVID-19 declaration under Section 564(b)(1) of the Act, 21 U.S.C.section 360bbb-3(b)(1), unless the authorization is terminated  or revoked sooner.       Influenza A by PCR NEGATIVE NEGATIVE Final   Influenza B by PCR NEGATIVE NEGATIVE Final    Comment: (NOTE) The Xpert Xpress SARS-CoV-2/FLU/RSV plus assay is intended as an aid in the diagnosis of influenza from Nasopharyngeal swab specimens and should not be used as a sole basis for treatment. Nasal washings and aspirates are unacceptable for Xpert Xpress SARS-CoV-2/FLU/RSV testing.  Fact Sheet for Patients: EntrepreneurPulse.com.au  Fact Sheet for Healthcare  Providers: IncredibleEmployment.be  This test is not yet approved or cleared by the [place] FDA and has been authorized for detection and/or diagnosis of SARS-CoV-2 by FDA under an Emergency Use Authorization (EUA). This EUA will remain in effect (meaning this test can be used) for the duration of the COVID-19 declaration under Section 564(b)(1) of the Act, 21 U.S.C. section 360bbb-3(b)(1), unless the authorization is terminated or revoked.  Performed at [facility], [place] [address] [place], [place] [address]   MRSA Next Gen by PCR, Nasal     Status: Abnormal   Collection Time: 12/27/20 12:14 AM   Specimen: Nasal Mucosa; Nasal Swab  Result Value Ref Range Status   MRSA by PCR Next Gen DETECTED (A) NOT DETECTED Final    Comment: RESULT CALLED TO, READ BACK BY AND VERIFIED WITH: RN TAYLOR P. 12/27/20@1 :26 BY TW (NOTE) The GeneXpert MRSA Assay (FDA approved for NASAL specimens only), is one component of a comprehensive MRSA colonization surveillance program. It is not intended to diagnose MRSA infection nor to guide or monitor treatment for MRSA infections. Test performance is not FDA approved in patients less than 82 years old. Performed at [facility], [place] [address], [place], [place] [address]     Anti-infectives:  Anti-infectives (From admission, onward)    Start     Dose/Rate Route Frequency Ordered Stop   12/26/20 1945  ceFAZolin (ANCEF) IVPB 2g/100 mL premix        2 g 200 mL/hr over 30 Minutes Intravenous  Once 12/26/20 1932 12/26/20 2044       Best Practice/Protocols:  VTE Prophylaxis: Mechanical Continous Sedation  Consults: Treatment Team:  Karsten Ro, DO    Studies:    Events:  Subjective:    Overnight Issues:   Objective:  Vital signs for last 24 hours: Temp:  [97.1 F (36.2 C)-100 F (37.8 C)] 100 F (37.8 C) (10/08 0822) Pulse Rate:  [76-106] 91 (10/08 0822) Resp:  [12-23] 20 (10/08 0822) BP:  (93-153)/(65-98) 117/70 (10/08 0822) SpO2:  [97 %-100 %] 97 % (10/08 0822) FiO2 (%):  [50 %-100 %] 50 % (10/08 0803) Weight:  [113.4 kg] 113.4 kg (10/07 1932)  Hemodynamic parameters for last 24 hours:    Intake/Output from previous day: 10/07 0701 - 10/08 0700 In: 2960.2 [I.V.:2080.2; Blood:880] Out: 2478 [Urine:1253; Emesis/NG output:625]  Intake/Output this shift: No intake/output data recorded.  Vent settings for last 24 hours: Vent Mode: PRVC FiO2 (%):  [50 %-100 %] 50 % Set Rate:  [20 bmp] 20 bmp Vt Set:  [650 mL] 620 mL PEEP:  [5 cmH20] 5 cmH20 Plateau Pressure:  [20 cmH20-30 cmH20] 30 cmH20  Physical Exam:  General: intubated, sedated, intermittently FC Neuro: intubated, sedated, intermittently FC, PERRL HEENT/Neck: ETT WNL  Resp: clear to auscultation bilaterally CVS: regular rate and rhythm, S1, S2 normal, no murmur, click, rub or gallop GI: soft, nontender, BS WNL, no r/g Skin: multiple abrasions Extremities: no edema, no erythema, pulses WNL  Results for orders placed or performed during the hospital encounter of 12/26/20 (from the past 24 hour(s))  CBC     Status: Abnormal   Collection Time: 12/26/20  6:45 PM  Result Value Ref Range   WBC 11.5 (H) 4.0 - 10.5 K/uL   RBC 4.23 4.22 - 5.81 MIL/uL   Hemoglobin 13.6 13.0 - 17.0 g/dL   HCT 39.6 39.0 - 52.0 %   MCV 93.6 80.0 - 100.0 fL  MCH 32.2 26.0 - 34.0 pg   MCHC 34.3 30.0 - 36.0 g/dL   RDW 12.0 11.5 - 15.5 %   Platelets 200 150 - 400 K/uL   nRBC 0.0 0.0 - 0.2 %  Ethanol     Status: Abnormal   Collection Time: 12/26/20  6:45 PM  Result Value Ref Range   Alcohol, Ethyl (B) 251 (H) <10 mg/dL  Lactic acid, plasma     Status: Abnormal   Collection Time: 12/26/20  6:45 PM  Result Value Ref Range   Lactic Acid, Venous 3.0 (HH) 0.5 - 1.9 mmol/L  Sample to Blood Bank     Status: None   Collection Time: 12/26/20  6:45 PM  Result Value Ref Range   Blood Bank Specimen SAMPLE AVAILABLE FOR TESTING    Sample  Expiration      12/27/2020,2359 Performed at [facility], [place] [address] [place], [place] [address]   Type and screen     Status: None   Collection Time: 12/26/20  6:45 PM  Result Value Ref Range   ABO/RH(D) O POS    Antibody Screen NEG    Sample Expiration      12/29/2020,2359 Performed at [facility], [place] [address] [place], [place] [address]   Resp Panel by RT-PCR (Flu A&B, Covid) Nasopharyngeal Swab     Status: None   Collection Time: 12/26/20  6:55 PM   Specimen: Nasopharyngeal Swab; Nasopharyngeal(NP) swabs in vial transport medium  Result Value Ref Range   SARS Coronavirus 2 by RT PCR NEGATIVE NEGATIVE   Influenza A by PCR NEGATIVE NEGATIVE   Influenza B by PCR NEGATIVE NEGATIVE  I-Stat Chem 8, ED     Status: Abnormal   Collection Time: 12/26/20  6:56 PM  Result Value Ref Range   Sodium 132 (L) 135 - 145 mmol/L   Potassium 4.6 3.5 - 5.1 mmol/L   Chloride 94 (L) 98 - 111 mmol/L   BUN 11 6 - 20 mg/dL   Creatinine, Ser 1.30 (H) 0.61 - 1.24 mg/dL   Glucose, Bld 196 (H) 70 - 99 mg/dL   Calcium, Ion 1.00 (L) 1.15 - 1.40 mmol/L   TCO2 27 22 - 32 mmol/L   Hemoglobin 13.9 13.0 - 17.0 g/dL   HCT 41.0 39.0 - 52.0 %  Urinalysis, Routine w reflex microscopic Urine, Catheterized     Status: Abnormal   Collection Time: 12/26/20  7:30 PM  Result Value Ref Range   Color, Urine YELLOW YELLOW   APPearance CLEAR CLEAR   Specific Gravity, Urine 1.033 (H) 1.005 - 1.030   pH 5.0 5.0 - 8.0   Glucose, UA >=500 (A) NEGATIVE mg/dL   Hgb urine dipstick NEGATIVE NEGATIVE   Bilirubin Urine NEGATIVE NEGATIVE   Ketones, ur 5 (A) NEGATIVE mg/dL   Protein, ur 30 (A) NEGATIVE mg/dL   Nitrite NEGATIVE NEGATIVE   Leukocytes,Ua NEGATIVE NEGATIVE   RBC / HPF 0-5 0 - 5 RBC/hpf   WBC, UA 0-5 0 - 5 WBC/hpf   Bacteria, UA NONE SEEN NONE SEEN   Squamous Epithelial / LPF 0-5 0 - 5   Mucus PRESENT   Rapid urine drug screen (hospital performed)     Status: Abnormal   Collection Time:  12/26/20  7:30 PM  Result Value Ref Range   Opiates POSITIVE (A) NONE DETECTED   Cocaine POSITIVE (A) NONE DETECTED   Benzodiazepines NONE DETECTED NONE DETECTED   Amphetamines NONE DETECTED NONE DETECTED   Tetrahydrocannabinol  NONE DETECTED NONE DETECTED   Barbiturates NONE DETECTED NONE DETECTED  I-Stat arterial blood gas, ED     Status: Abnormal   Collection Time: 12/26/20  7:35 PM  Result Value Ref Range   pH, Arterial 7.429 7.350 - 7.450   pCO2 arterial 39.1 32.0 - 48.0 mmHg   pO2, Arterial 293 (H) 83.0 - 108.0 mmHg   Bicarbonate 26.1 20.0 - 28.0 mmol/L   TCO2 27 22 - 32 mmol/L   O2 Saturation 100.0 %   Acid-Base Excess 1.0 0.0 - 2.0 mmol/L   Sodium 131 (L) 135 - 145 mmol/L   Potassium 3.7 3.5 - 5.1 mmol/L   Calcium, Ion 1.08 (L) 1.15 - 1.40 mmol/L   HCT 34.0 (L) 39.0 - 52.0 %   Hemoglobin 11.6 (L) 13.0 - 17.0 g/dL   Patient temperature 96.8 F    Collection site Radial    Drawn by RT    Sample type ARTERIAL   Prepare Pheresed Platelets     Status: None (Preliminary result)   Collection Time: 12/26/20  8:23 PM  Result Value Ref Range   Unit Number [id number]    Blood Component Type PLTP1 PSORALEN TREATED    Unit division 00    Status of Unit ISSUED    Transfusion Status OK TO TRANSFUSE    Unit Number [id number]    Blood Component Type PLTP3 PSORALEN TREATED    Unit division 00    Status of Unit ISSUED    Transfusion Status OK TO TRANSFUSE    Unit Number [id number]    Blood Component Type PLTP2 PSORALEN TREATED    Unit division 00    Status of Unit ISSUED    Transfusion Status OK TO TRANSFUSE    Unit Number [id number]    Blood Component Type PLTP2 PSORALEN TREATED    Unit division 00    Status of Unit ISSUED    Transfusion Status      OK TO TRANSFUSE Performed at [facility], [address] [place], [place] [address]   HIV Antibody (routine testing w rflx)     Status: None   Collection Time: 12/26/20  8:46 PM  Result Value Ref Range   HIV  Screen 4th Generation wRfx Non Reactive Non Reactive  ABO/Rh     Status: None   Collection Time: 12/26/20  9:24 PM  Result Value Ref Range   ABO/RH(D)      O POS Performed at [place] [address], [place], [place] [address]   Hemoglobin A1c     Status: Abnormal   Collection Time: 12/26/20  9:30 PM  Result Value Ref Range   Hgb A1c MFr Bld 8.0 (H) 4.8 - 5.6 %   Mean Plasma Glucose 182.9 mg/dL  MRSA Next Gen by PCR, Nasal     Status: Abnormal   Collection Time: 12/27/20 12:14 AM   Specimen: Nasal Mucosa; Nasal Swab  Result Value Ref Range   MRSA by PCR Next Gen DETECTED (A) NOT DETECTED  Glucose, capillary     Status: Abnormal   Collection Time: 12/27/20 12:56 AM  Result Value Ref Range   Glucose-Capillary 173 (H) 70 - 99 mg/dL  Triglycerides     Status: Abnormal   Collection Time: 12/27/20  1:51 AM  Result Value Ref Range   Triglycerides 193 (H) <150 mg/dL  Comprehensive metabolic panel     Status: Abnormal   Collection Time: 12/27/20  1:51 AM  Result Value Ref Range  Sodium 133 (L) 135 - 145 mmol/L   Potassium 3.7 3.5 - 5.1 mmol/L   Chloride 98 98 - 111 mmol/L   CO2 23 22 - 32 mmol/L   Glucose, Bld 150 (H) 70 - 99 mg/dL   BUN 7 6 - 20 mg/dL   Creatinine, Ser 0.84 0.61 - 1.24 mg/dL   Calcium 8.0 (L) 8.9 - 10.3 mg/dL   Total Protein 5.6 (L) 6.5 - 8.1 g/dL   Albumin 3.3 (L) 3.5 - 5.0 g/dL   AST 24 15 - 41 U/L   ALT 21 0 - 44 U/L   Alkaline Phosphatase 60 38 - 126 U/L   Total Bilirubin 0.8 0.3 - 1.2 mg/dL   GFR, Estimated >60 >60 mL/min   Anion gap 12 5 - 15  Protime-INR     Status: None   Collection Time: 12/27/20  1:51 AM  Result Value Ref Range   Prothrombin Time 13.9 11.4 - 15.2 seconds   INR 1.1 0.8 - 1.2  Glucose, capillary     Status: Abnormal   Collection Time: 12/27/20  3:26 AM  Result Value Ref Range   Glucose-Capillary 144 (H) 70 - 99 mg/dL  CBC     Status: Abnormal   Collection Time: 12/27/20  5:58 AM  Result Value Ref Range   WBC 8.3  4.0 - 10.5 K/uL   RBC 3.51 (L) 4.22 - 5.81 MIL/uL   Hemoglobin 11.2 (L) 13.0 - 17.0 g/dL   HCT 32.9 (L) 39.0 - 52.0 %   MCV 93.7 80.0 - 100.0 fL   MCH 31.9 26.0 - 34.0 pg   MCHC 34.0 30.0 - 36.0 g/dL   RDW 12.0 11.5 - 15.5 %   Platelets 209 150 - 400 K/uL   nRBC 0.0 0.0 - 0.2 %  Glucose, capillary     Status: Abnormal   Collection Time: 12/27/20  8:09 AM  Result Value Ref Range   Glucose-Capillary 152 (H) 70 - 99 mg/dL    Assessment & Plan: Present on Admission:  TBI (traumatic brain injury)  [facility] TBI/B SAH/ICC near vertex/hemorrhage along splenium - Dr. Reatha Armour; repeat head ct grossly stable, ok to start subcu heparin 10/9 Scalp hematoma Multiple facial abrasions Acute hypoxic ventilator dependent respiratory failure - full support, will try to wean PS; h/o OSA; will start precedex to minimize need for prop/fentanyl DM - A1c 8; SSI HX colon cancer HX HTN - on lisinopril at home HX R MCA CVA On Brilinta FEN- mIVF; mild hyponatremia - follow NA, NPO; if looks like will remain vented - will start enteral feeds in next 24-36 hrs VTE prophylaxis- scds now, can start subcu heparin 10/9 per NS +UDS- opiates, cocaine ETOH use- CIWA   LOS: 1 day   Additional comments:I reviewed the patient's new clinical lab test results. , I reviewed the patients new imaging test results. , and I have discussed and reviewed with family members patient's daughter  Critical Care Total Time*: 2 min  Leighton Ruff. Redmond Pulling, MD, FACS General, Bariatric, & Minimally Invasive Surgery [facility], [place]   12/27/2020  *Care during the described time interval was provided by me. I have reviewed this patient's available data, including medical history, events of note, physical examination and test results as part of my evaluation.

## 2020-12-27 NOTE — Progress Notes (Signed)
Dr. [place] Silos paged and updated. Patient's blood pressure 82/53. Precedex stopped, fentanyl rate decreased. New verbal orders for 25 grams albumin IV. Dr. [place] Silos also made aware of low urine output so far this shift. Will continue to monitor.

## 2020-12-28 ENCOUNTER — Inpatient Hospital Stay (HOSPITAL_COMMUNITY): Payer: Medicaid Other

## 2020-12-28 LAB — PREPARE PLATELET PHERESIS
Unit division: 0
Unit division: 0
Unit division: 0
Unit division: 0

## 2020-12-28 LAB — CBC WITH DIFFERENTIAL/PLATELET
Abs Immature Granulocytes: 0.04 10*3/uL (ref 0.00–0.07)
Basophils Absolute: 0.1 10*3/uL (ref 0.0–0.1)
Basophils Relative: 1 %
Eosinophils Absolute: 0.2 10*3/uL (ref 0.0–0.5)
Eosinophils Relative: 3 %
HCT: 29.9 % — ABNORMAL LOW (ref 39.0–52.0)
Hemoglobin: 10.2 g/dL — ABNORMAL LOW (ref 13.0–17.0)
Immature Granulocytes: 1 %
Lymphocytes Relative: 19 %
Lymphs Abs: 1.4 10*3/uL (ref 0.7–4.0)
MCH: 32.5 pg (ref 26.0–34.0)
MCHC: 34.1 g/dL (ref 30.0–36.0)
MCV: 95.2 fL (ref 80.0–100.0)
Monocytes Absolute: 0.7 10*3/uL (ref 0.1–1.0)
Monocytes Relative: 9 %
Neutro Abs: 5.2 10*3/uL (ref 1.7–7.7)
Neutrophils Relative %: 67 %
Platelets: 174 10*3/uL (ref 150–400)
RBC: 3.14 MIL/uL — ABNORMAL LOW (ref 4.22–5.81)
RDW: 12 % (ref 11.5–15.5)
WBC: 7.7 10*3/uL (ref 4.0–10.5)
nRBC: 0 % (ref 0.0–0.2)

## 2020-12-28 LAB — BASIC METABOLIC PANEL
Anion gap: 5 (ref 5–15)
BUN: 10 mg/dL (ref 6–20)
CO2: 25 mmol/L (ref 22–32)
Calcium: 8 mg/dL — ABNORMAL LOW (ref 8.9–10.3)
Chloride: 104 mmol/L (ref 98–111)
Creatinine, Ser: 0.84 mg/dL (ref 0.61–1.24)
GFR, Estimated: >60 mL/min (ref >60)
Glucose, Bld: 116 mg/dL — ABNORMAL HIGH (ref 70–99)
Potassium: 4 mmol/L (ref 3.5–5.1)
Sodium: 134 mmol/L — ABNORMAL LOW (ref 135–145)

## 2020-12-28 LAB — BPAM PLATELET PHERESIS
Blood Product Expiration Date: 202210082359
Blood Product Expiration Date: 202210082359
Blood Product Expiration Date: 202210092359
Blood Product Expiration Date: 202210102359
ISSUE DATE / TIME: 202210072202
ISSUE DATE / TIME: 202210080029
ISSUE DATE / TIME: 202210080320
ISSUE DATE / TIME: 202210080602
Unit Type and Rh: 5100
Unit Type and Rh: 6200
Unit Type and Rh: 6200
Unit Type and Rh: 6200

## 2020-12-28 LAB — GLUCOSE, CAPILLARY
Glucose-Capillary: 110 mg/dL — ABNORMAL HIGH (ref 70–99)
Glucose-Capillary: 112 mg/dL — ABNORMAL HIGH (ref 70–99)
Glucose-Capillary: 115 mg/dL — ABNORMAL HIGH (ref 70–99)
Glucose-Capillary: 120 mg/dL — ABNORMAL HIGH (ref 70–99)
Glucose-Capillary: 134 mg/dL — ABNORMAL HIGH (ref 70–99)
Glucose-Capillary: 136 mg/dL — ABNORMAL HIGH (ref 70–99)

## 2020-12-28 LAB — MAGNESIUM: Magnesium: 1.8 mg/dL (ref 1.7–2.4)

## 2020-12-28 MED ORDER — OXYCODONE HCL 5 MG/5ML PO SOLN
5.0000 mg | ORAL | Status: DC | PRN
  Administered 2020-12-28: 5 mg
  Administered 2020-12-29 – 2020-12-31 (×7): 10 mg
  Administered 2021-01-01 – 2021-01-02 (×4): 5 mg
  Administered 2021-01-02 – 2021-01-06 (×11): 10 mg
  Administered 2021-01-06: 5 mg
  Administered 2021-01-09 – 2021-01-10 (×2): 10 mg
  Administered 2021-01-14: 5 mg
  Filled 2020-12-28 (×5): qty 10
  Filled 2020-12-28: qty 5
  Filled 2020-12-28 (×4): qty 10
  Filled 2020-12-28: qty 5
  Filled 2020-12-28: qty 10
  Filled 2020-12-28: qty 5
  Filled 2020-12-28 (×3): qty 10
  Filled 2020-12-28 (×2): qty 5
  Filled 2020-12-28 (×5): qty 10
  Filled 2020-12-28: qty 5
  Filled 2020-12-28 (×3): qty 10

## 2020-12-28 MED ORDER — ACETAMINOPHEN 160 MG/5ML PO SOLN
650.0000 mg | Freq: Three times a day (TID) | ORAL | Status: DC
  Administered 2020-12-28 – 2020-12-30 (×6): 650 mg
  Filled 2020-12-28 (×6): qty 20.3

## 2020-12-28 MED ORDER — HEPARIN SODIUM (PORCINE) 5000 UNIT/ML IJ SOLN
5000.0000 [IU] | Freq: Three times a day (TID) | INTRAMUSCULAR | Status: DC
  Administered 2020-12-28 – 2021-01-12 (×45): 5000 [IU] via SUBCUTANEOUS
  Filled 2020-12-28 (×45): qty 1
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
## 2020-12-29 ENCOUNTER — Ambulatory Visit (INDEPENDENT_AMBULATORY_CARE_PROVIDER_SITE_OTHER): Payer: Medicaid Other

## 2020-12-29 ENCOUNTER — Telehealth: Payer: Self-pay

## 2020-12-29 ENCOUNTER — Inpatient Hospital Stay (HOSPITAL_COMMUNITY): Payer: Medicaid Other

## 2020-12-29 DIAGNOSIS — I639 Cerebral infarction, unspecified: Secondary | ICD-10-CM

## 2020-12-29 LAB — CBC
HCT: 30.9 % — ABNORMAL LOW (ref 39.0–52.0)
Hemoglobin: 10.2 g/dL — ABNORMAL LOW (ref 13.0–17.0)
MCH: 32 pg (ref 26.0–34.0)
MCHC: 33 g/dL (ref 30.0–36.0)
MCV: 96.9 fL (ref 80.0–100.0)
Platelets: 143 10*3/uL — ABNORMAL LOW (ref 150–400)
RBC: 3.19 MIL/uL — ABNORMAL LOW (ref 4.22–5.81)
RDW: 11.8 % (ref 11.5–15.5)
WBC: 6.7 10*3/uL (ref 4.0–10.5)
nRBC: 0 % (ref 0.0–0.2)

## 2020-12-29 LAB — GLUCOSE, CAPILLARY
Glucose-Capillary: 131 mg/dL — ABNORMAL HIGH (ref 70–99)
Glucose-Capillary: 126 mg/dL — ABNORMAL HIGH (ref 70–99)
Glucose-Capillary: 140 mg/dL — ABNORMAL HIGH (ref 70–99)
Glucose-Capillary: 144 mg/dL — ABNORMAL HIGH (ref 70–99)
Glucose-Capillary: 120 mg/dL — ABNORMAL HIGH (ref 70–99)
Glucose-Capillary: 169 mg/dL — ABNORMAL HIGH (ref 70–99)

## 2020-12-29 LAB — BASIC METABOLIC PANEL
Anion gap: 7 (ref 5–15)
BUN: 10 mg/dL (ref 6–20)
CO2: 21 mmol/L — ABNORMAL LOW (ref 22–32)
Calcium: 8.1 mg/dL — ABNORMAL LOW (ref 8.9–10.3)
Chloride: 107 mmol/L (ref 98–111)
Creatinine, Ser: 0.92 mg/dL (ref 0.61–1.24)
GFR, Estimated: >60 mL/min (ref >60)
Glucose, Bld: 118 mg/dL — ABNORMAL HIGH (ref 70–99)
Potassium: 4.3 mmol/L (ref 3.5–5.1)
Sodium: 135 mmol/L (ref 135–145)

## 2020-12-29 LAB — MAGNESIUM
Magnesium: 2 mg/dL (ref 1.7–2.4)
Magnesium: 2.1 mg/dL (ref 1.7–2.4)

## 2020-12-29 LAB — PHOSPHORUS
Phosphorus: 3.2 mg/dL (ref 2.5–4.6)
Phosphorus: 3.2 mg/dL (ref 2.5–4.6)

## 2020-12-29 MED ORDER — PIVOT 1.5 CAL PO LIQD
1000.0000 mL | ORAL | Status: DC
  Administered 2020-12-29 – 2021-01-15 (×14): 1000 mL
  Filled 2020-12-29 (×6): qty 1000
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
## 2020-12-29 NOTE — Progress Notes (Signed)
Dentures return to patient's daughter.
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
## 2020-12-29 NOTE — Progress Notes (Signed)
RT at bedside to assess cuff leak.  ETT noted to be at 22 cm. Advanced to 25cm per previous position. Good return on VT. Cuff leak not audible at this time.

## 2020-12-30 ENCOUNTER — Inpatient Hospital Stay (HOSPITAL_COMMUNITY): Payer: Medicaid Other

## 2020-12-30 LAB — BASIC METABOLIC PANEL
Anion gap: 5 (ref 5–15)
BUN: 9 mg/dL (ref 6–20)
CO2: 24 mmol/L (ref 22–32)
Calcium: 8.2 mg/dL — ABNORMAL LOW (ref 8.9–10.3)
Chloride: 108 mmol/L (ref 98–111)
Creatinine, Ser: 0.83 mg/dL (ref 0.61–1.24)
GFR, Estimated: >60 mL/min (ref >60)
Glucose, Bld: 154 mg/dL — ABNORMAL HIGH (ref 70–99)
Potassium: 4 mmol/L (ref 3.5–5.1)
Sodium: 137 mmol/L (ref 135–145)

## 2020-12-30 LAB — GLUCOSE, CAPILLARY
Glucose-Capillary: 163 mg/dL — ABNORMAL HIGH (ref 70–99)
Glucose-Capillary: 186 mg/dL — ABNORMAL HIGH (ref 70–99)
Glucose-Capillary: 108 mg/dL — ABNORMAL HIGH (ref 70–99)
Glucose-Capillary: 145 mg/dL — ABNORMAL HIGH (ref 70–99)
Glucose-Capillary: 151 mg/dL — ABNORMAL HIGH (ref 70–99)
Glucose-Capillary: 206 mg/dL — ABNORMAL HIGH (ref 70–99)

## 2020-12-30 LAB — CBC
HCT: 30.6 % — ABNORMAL LOW (ref 39.0–52.0)
Hemoglobin: 10.5 g/dL — ABNORMAL LOW (ref 13.0–17.0)
MCH: 32.6 pg (ref 26.0–34.0)
MCHC: 34.3 g/dL (ref 30.0–36.0)
MCV: 95 fL (ref 80.0–100.0)
Platelets: 184 10*3/uL (ref 150–400)
RBC: 3.22 MIL/uL — ABNORMAL LOW (ref 4.22–5.81)
RDW: 11.9 % (ref 11.5–15.5)
WBC: 7.8 10*3/uL (ref 4.0–10.5)
nRBC: 0 % (ref 0.0–0.2)

## 2020-12-30 LAB — PHOSPHORUS: Phosphorus: 3 mg/dL (ref 2.5–4.6)

## 2020-12-30 LAB — AMMONIA: Ammonia: 35 umol/L (ref 9–35)

## 2020-12-30 LAB — MAGNESIUM: Magnesium: 2 mg/dL (ref 1.7–2.4)

## 2020-12-30 MED ORDER — LISINOPRIL 2.5 MG PO TABS
2.5000 mg | ORAL_TABLET | Freq: Every day | ORAL | Status: DC
  Administered 2020-12-30 – 2021-01-17 (×19): 2.5 mg
  Filled 2020-12-30 (×19): qty 1

## 2020-12-30 MED ORDER — SODIUM CHLORIDE 0.9 % IV BOLUS
1000.0000 mL | Freq: Once | INTRAVENOUS | Status: AC
  Administered 2020-12-30: 1000 mL via INTRAVENOUS

## 2020-12-30 MED ORDER — LORAZEPAM 2 MG/ML IJ SOLN
1.0000 mg | INTRAMUSCULAR | Status: AC | PRN
  Filled 2020-12-30: qty 1

## 2020-12-30 MED ORDER — DIVALPROEX SODIUM ER 500 MG PO TB24: 500.0000 mg | ORAL_TABLET | Freq: Every day | ORAL | Status: DC

## 2020-12-30 MED ORDER — LORAZEPAM 1 MG PO TABS: 1.0000 mg | ORAL_TABLET | ORAL | Status: AC | PRN

## 2020-12-30 MED ORDER — ROSUVASTATIN CALCIUM 20 MG PO TABS
20.0000 mg | ORAL_TABLET | Freq: Every day | ORAL | Status: DC
  Administered 2020-12-30 – 2021-01-16 (×18): 20 mg
  Filled 2020-12-30 (×18): qty 1

## 2020-12-30 MED ORDER — IOHEXOL 350 MG/ML SOLN
75.0000 mL | Freq: Once | INTRAVENOUS | Status: AC | PRN
  Administered 2020-12-30: 75 mL via INTRAVENOUS

## 2020-12-30 MED ORDER — QUETIAPINE FUMARATE 25 MG PO TABS
25.0000 mg | ORAL_TABLET | Freq: Every evening | ORAL | Status: DC | PRN
  Administered 2021-01-01 – 2021-01-11 (×2): 25 mg
  Filled 2020-12-30: qty 1

## 2020-12-30 MED ORDER — SODIUM CHLORIDE 0.9 % IV SOLN: INTRAVENOUS | Status: DC

## 2020-12-30 MED ORDER — ACETAMINOPHEN 500 MG PO TABS
1000.0000 mg | ORAL_TABLET | Freq: Four times a day (QID) | ORAL | Status: DC
  Administered 2020-12-30 – 2021-01-17 (×66): 1000 mg
  Filled 2020-12-30 (×69): qty 2

## 2020-12-30 MED ORDER — FLUOXETINE HCL 20 MG PO CAPS
40.0000 mg | ORAL_CAPSULE | Freq: Every day | ORAL | Status: DC
  Administered 2020-12-30 – 2021-01-17 (×19): 40 mg
  Filled 2020-12-30 (×19): qty 2

## 2020-12-30 MED ORDER — VALPROIC ACID 250 MG/5ML PO SOLN
250.0000 mg | Freq: Two times a day (BID) | ORAL | Status: DC
  Administered 2020-12-30 – 2021-01-17 (×37): 250 mg
  Filled 2020-12-30 (×38): qty 5

## 2020-12-30 MED ORDER — METHOCARBAMOL 500 MG PO TABS
1000.0000 mg | ORAL_TABLET | Freq: Three times a day (TID) | ORAL | Status: DC
  Administered 2020-12-30 – 2021-01-16 (×47): 1000 mg
  Filled 2020-12-30 (×48): qty 2

## 2020-12-30 MED ORDER — GUAIFENESIN 100 MG/5ML PO LIQD
10.0000 mL | Freq: Four times a day (QID) | ORAL | Status: DC
  Administered 2020-12-30 – 2021-01-08 (×35): 200 mg
  Administered 2021-01-08 – 2021-01-17 (×36): 10 mL
  Filled 2020-12-30: qty 10
  Filled 2020-12-30 (×8): qty 15
  Filled 2020-12-30: qty 30
  Filled 2020-12-30: qty 15
  Filled 2020-12-30: qty 10
  Filled 2020-12-30 (×6): qty 15
  Filled 2020-12-30: qty 10
  Filled 2020-12-30 (×3): qty 15
  Filled 2020-12-30: qty 10
  Filled 2020-12-30 (×3): qty 15
  Filled 2020-12-30: qty 30
  Filled 2020-12-30: qty 15
  Filled 2020-12-30: qty 10
  Filled 2020-12-30 (×2): qty 15
  Filled 2020-12-30: qty 10
  Filled 2020-12-30: qty 15
  Filled 2020-12-30: qty 10
  Filled 2020-12-30 (×4): qty 15
  Filled 2020-12-30 (×3): qty 10
  Filled 2020-12-30 (×4): qty 15
  Filled 2020-12-30: qty 10
  Filled 2020-12-30 (×5): qty 15
  Filled 2020-12-30: qty 10
  Filled 2020-12-30 (×2): qty 15
  Filled 2020-12-30 (×2): qty 10
  Filled 2020-12-30 (×2): qty 30
  Filled 2020-12-30: qty 10
  Filled 2020-12-30 (×2): qty 15
  Filled 2020-12-30: qty 10
  Filled 2020-12-30: qty 15
  Filled 2020-12-30 (×2): qty 10
  Filled 2020-12-30: qty 15
  Filled 2020-12-30: qty 10
  Filled 2020-12-30: qty 15
  Filled 2020-12-30: qty 30
  Filled 2020-12-30 (×3): qty 15
  Filled 2020-12-30: qty 10
  Filled 2020-12-30 (×2): qty 15
  Filled 2020-12-30: qty 10
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
## 2020-12-30 NOTE — Progress Notes (Signed)
Patient transported to CT for neck scan. Patient returned to full vent support for the transport. Patient tolerated scan and transport well. No apparent complications. Patient returned to wean mode on the vent after returning to the room.

## 2020-12-31 ENCOUNTER — Inpatient Hospital Stay (HOSPITAL_COMMUNITY): Payer: Medicaid Other

## 2020-12-31 LAB — GLUCOSE, CAPILLARY
Glucose-Capillary: 153 mg/dL — ABNORMAL HIGH (ref 70–99)
Glucose-Capillary: 150 mg/dL — ABNORMAL HIGH (ref 70–99)
Glucose-Capillary: 136 mg/dL — ABNORMAL HIGH (ref 70–99)
Glucose-Capillary: 136 mg/dL — ABNORMAL HIGH (ref 70–99)
Glucose-Capillary: 154 mg/dL — ABNORMAL HIGH (ref 70–99)
Glucose-Capillary: 181 mg/dL — ABNORMAL HIGH (ref 70–99)

## 2020-12-31 LAB — BASIC METABOLIC PANEL
Anion gap: 7 (ref 5–15)
BUN: 10 mg/dL (ref 6–20)
CO2: 22 mmol/L (ref 22–32)
Calcium: 8.1 mg/dL — ABNORMAL LOW (ref 8.9–10.3)
Chloride: 109 mmol/L (ref 98–111)
Creatinine, Ser: 0.76 mg/dL (ref 0.61–1.24)
GFR, Estimated: >60 mL/min (ref >60)
Glucose, Bld: 198 mg/dL — ABNORMAL HIGH (ref 70–99)
Potassium: 4.2 mmol/L (ref 3.5–5.1)
Sodium: 138 mmol/L (ref 135–145)

## 2020-12-31 LAB — CBC
HCT: 29.8 % — ABNORMAL LOW (ref 39.0–52.0)
Hemoglobin: 9.7 g/dL — ABNORMAL LOW (ref 13.0–17.0)
MCH: 31.8 pg (ref 26.0–34.0)
MCHC: 32.6 g/dL (ref 30.0–36.0)
MCV: 97.7 fL (ref 80.0–100.0)
Platelets: 194 10*3/uL (ref 150–400)
RBC: 3.05 MIL/uL — ABNORMAL LOW (ref 4.22–5.81)
RDW: 12.2 % (ref 11.5–15.5)
WBC: 7.3 10*3/uL (ref 4.0–10.5)
nRBC: 0 % (ref 0.0–0.2)

## 2020-12-31 NOTE — Progress Notes (Signed)
Orthopedic Tech Progress Note Patient Details:  Scott Day [facility] June 22, 1960 [id number] Dropped off 1 prafo boot for patient. RN explained she will switch boot to each foot instead of bilateral  Ortho Devices Type of Ortho Device: Prafo boot/shoe Ortho Device/Splint Location: LE Ortho Device/Splint Interventions: Ordered   Post Interventions Patient Tolerated: Other (comment) Instructions Provided: Other (comment)  Ellouise Newer 12/31/2020, 10:54 PM

## 2020-12-31 NOTE — Progress Notes (Signed)
Patient transported to MRI and back to 4N24 without any apparent complications. Patient was returned to full ventilator support for the MRI.
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
## 2020-12-31 NOTE — Progress Notes (Signed)
Patient ID: Scott Day, male   DOB: 10-Jun-1960, 60 y.o.   MRN: [id number] I met with his daughter at the bedside.  She is his healthcare power of attorney.  I updated her on his status and the current plan of care.  I answered her questions.  Georganna Skeans, MD, MPH, FACS Please use [email or web address] to contact on call provider

## 2020-12-31 NOTE — Procedures (Signed)
Cortrak ? ?Tube Type:  Cortrak - 43 inches ?Tube Location:  Right nare ?Initial Placement:  Stomach ?Secured by: Bridle ?Technique Used to Measure Tube Placement:  Marking at nare/corner of mouth ?Cortrak Secured At:  70 cm ? ?Cortrak Tube Team Note: ? ?Consult received to place a Cortrak feeding tube.  ? ?X-ray is required, abdominal x-ray has been ordered by the Cortrak team. Please confirm tube placement before using the Cortrak tube.  ? ?If the tube becomes dislodged please keep the tube and contact the Cortrak team at [email or web address] ([email or web address]) for replacement.  ?If after hours and replacement cannot be delayed, place a NG tube and confirm placement with an abdominal x-ray.  ? ? ?Rasul Decola MS, RD, LDN ?Please refer to AMION for RD and/or RD on-call/weekend/after hours pager ? ? ?
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
## 2021-01-01 ENCOUNTER — Inpatient Hospital Stay (HOSPITAL_COMMUNITY): Payer: Medicaid Other

## 2021-01-01 LAB — BASIC METABOLIC PANEL
Anion gap: 7 (ref 5–15)
BUN: 9 mg/dL (ref 6–20)
CO2: 24 mmol/L (ref 22–32)
Calcium: 8.2 mg/dL — ABNORMAL LOW (ref 8.9–10.3)
Chloride: 109 mmol/L (ref 98–111)
Creatinine, Ser: 0.67 mg/dL (ref 0.61–1.24)
GFR, Estimated: >60 mL/min (ref >60)
Glucose, Bld: 176 mg/dL — ABNORMAL HIGH (ref 70–99)
Potassium: 3.9 mmol/L (ref 3.5–5.1)
Sodium: 140 mmol/L (ref 135–145)

## 2021-01-01 LAB — CBC
HCT: 28 % — ABNORMAL LOW (ref 39.0–52.0)
Hemoglobin: 9.4 g/dL — ABNORMAL LOW (ref 13.0–17.0)
MCH: 32.4 pg (ref 26.0–34.0)
MCHC: 33.6 g/dL (ref 30.0–36.0)
MCV: 96.6 fL (ref 80.0–100.0)
Platelets: 205 10*3/uL (ref 150–400)
RBC: 2.9 MIL/uL — ABNORMAL LOW (ref 4.22–5.81)
RDW: 12 % (ref 11.5–15.5)
WBC: 9.7 10*3/uL (ref 4.0–10.5)
nRBC: 0 % (ref 0.0–0.2)

## 2021-01-01 LAB — GLUCOSE, CAPILLARY
Glucose-Capillary: 181 mg/dL — ABNORMAL HIGH (ref 70–99)
Glucose-Capillary: 209 mg/dL — ABNORMAL HIGH (ref 70–99)
Glucose-Capillary: 217 mg/dL — ABNORMAL HIGH (ref 70–99)
Glucose-Capillary: 195 mg/dL — ABNORMAL HIGH (ref 70–99)
Glucose-Capillary: 213 mg/dL — ABNORMAL HIGH (ref 70–99)
Glucose-Capillary: 214 mg/dL — ABNORMAL HIGH (ref 70–99)

## 2021-01-01 MED ORDER — CLONAZEPAM 0.5 MG PO TABS: 0.2500 mg | ORAL_TABLET | Freq: Two times a day (BID) | ORAL | Status: DC

## 2021-01-01 MED ORDER — QUETIAPINE FUMARATE 25 MG PO TABS
25.0000 mg | ORAL_TABLET | Freq: Every day | ORAL | Status: DC
  Administered 2021-01-02 – 2021-01-11 (×10): 25 mg via NASOGASTRIC
  Filled 2021-01-01 (×11): qty 1

## 2021-01-01 MED ORDER — CLONAZEPAM 0.25 MG PO TBDP
0.2500 mg | ORAL_TABLET | Freq: Two times a day (BID) | ORAL | Status: DC
  Administered 2021-01-01 – 2021-01-07 (×13): 0.25 mg
  Filled 2021-01-01 (×13): qty 1

## 2021-01-01 MED ORDER — FUROSEMIDE 10 MG/ML IJ SOLN
40.0000 mg | Freq: Once | INTRAMUSCULAR | Status: AC
  Administered 2021-01-01: 40 mg via INTRAVENOUS
  Filled 2021-01-01: qty 4

## 2021-01-01 NOTE — Progress Notes (Signed)
RT note. RT called to bedside to place bite block due to patient biting down on ETT. RT able to put orange biteblock on ETT but theres not enough slack to keep ETT in place in order to slide bite block down tube. Unable to place bite block at this time. Other option is to place oral bite block, but patient is able to spit out at this time, also told RN about keeping younker in place as a bite block if needed. Patient is sating anywhere from 94-96%, patient is achieving his volumes, and patient is not peak pressuring as well. RT will continue to monitor.RN aware.

## 2021-01-01 NOTE — Progress Notes (Signed)
Patient ID: Scott Day, male   DOB: 01-01-1961, 60 y.o.   MRN: [id number] Follow up - Trauma Critical Care  Patient Details:    Scott Day is an 60 y.o. male.  Lines/tubes : Airway 7.5 mm (Active)  Secured at (cm) 25 cm 01/01/21 0800  Measured From Lips 01/01/21 0800  [place] 01/01/21 0743  Secured By [employer] 01/01/21 0743  Tube Holder Repositioned Yes 01/01/21 0743  Prone position No 01/01/21 0346  Cuff Pressure (cm H2O) Clear OR 27-39 [facility] 01/01/21 0743  Site Condition Edema;Pain 01/01/21 0800     External Urinary Catheter (Active)  Collection Container Standard drainage bag 01/01/21 0800  Securement Method Tape 01/01/21 0800  Site Assessment Clean;Intact 01/01/21 0800  Intervention Male External Urinary Catheter Replaced 12/31/20 2000  Output (mL) 225 mL 01/01/21 0430    Microbiology/Sepsis markers: Results for orders placed or performed during the hospital encounter of 12/26/20  Resp Panel by RT-PCR (Flu A&B, Covid) Nasopharyngeal Swab     Status: None   Collection Time: 12/26/20  6:55 PM   Specimen: Nasopharyngeal Swab; Nasopharyngeal(NP) swabs in vial transport medium  Result Value Ref Range Status   SARS Coronavirus 2 by RT PCR NEGATIVE NEGATIVE Final    Comment: (NOTE) SARS-CoV-2 target nucleic acids are NOT DETECTED.  The SARS-CoV-2 RNA is generally detectable in upper respiratory specimens during the acute phase of infection. The lowest concentration of SARS-CoV-2 viral copies this assay can detect is 138 copies/mL. A negative result does not preclude SARS-Cov-2 infection and should not be used as the sole basis for treatment or other patient management decisions. A negative result may occur with  improper specimen collection/handling, submission of specimen other than nasopharyngeal swab, presence of viral mutation(s) within the areas targeted by this assay, and inadequate number of viral copies(<138 copies/mL).  A negative result must be combined with clinical observations, patient history, and epidemiological information. The expected result is Negative.  Fact Sheet for Patients:  EntrepreneurPulse.com.au  Fact Sheet for Healthcare Providers:  IncredibleEmployment.be  This test is no t yet approved or cleared by the [place] FDA and  has been authorized for detection and/or diagnosis of SARS-CoV-2 by FDA under an Emergency Use Authorization (EUA). This EUA will remain  in effect (meaning this test can be used) for the duration of the COVID-19 declaration under Section 564(b)(1) of the Act, 21 U.S.C.section 360bbb-3(b)(1), unless the authorization is terminated  or revoked sooner.       Influenza A by PCR NEGATIVE NEGATIVE Final   Influenza B by PCR NEGATIVE NEGATIVE Final    Comment: (NOTE) The Xpert Xpress SARS-CoV-2/FLU/RSV plus assay is intended as an aid in the diagnosis of influenza from Nasopharyngeal swab specimens and should not be used as a sole basis for treatment. Nasal washings and aspirates are unacceptable for Xpert Xpress SARS-CoV-2/FLU/RSV testing.  Fact Sheet for Patients: EntrepreneurPulse.com.au  Fact Sheet for Healthcare Providers: IncredibleEmployment.be  This test is not yet approved or cleared by the [place] FDA and has been authorized for detection and/or diagnosis of SARS-CoV-2 by FDA under an Emergency Use Authorization (EUA). This EUA will remain in effect (meaning this test can be used) for the duration of the COVID-19 declaration under Section 564(b)(1) of the Act, 21 U.S.C. section 360bbb-3(b)(1), unless the authorization is terminated or revoked.  Performed at [facility], [place] [address], [place],  [address]   MRSA Next Gen by PCR, Nasal     Status:  Abnormal   Collection Time: 12/27/20 12:14 AM   Specimen: Nasal Mucosa; Nasal Swab  Result Value Ref  Range Status   MRSA by PCR Next Gen DETECTED (A) NOT DETECTED Final    Comment: RESULT CALLED TO, READ BACK BY AND VERIFIED WITH: RN TAYLOR P. 12/27/20@1 :26 BY TW (NOTE) The GeneXpert MRSA Assay (FDA approved for NASAL specimens only), is one component of a comprehensive MRSA colonization surveillance program. It is not intended to diagnose MRSA infection nor to guide or monitor treatment for MRSA infections. Test performance is not FDA approved in patients less than 72 years old. Performed at [facility], [place] [address] [place], [place] [address]   Culture, Respiratory w Gram Stain     Status: None (Preliminary result)   Collection Time: 12/30/20 10:20 AM   Specimen: Tracheal Aspirate; Respiratory  Result Value Ref Range Status   Specimen Description TRACHEAL ASPIRATE  Final   Special Requests NONE  Final   Gram Stain   Final    FEW WBC PRESENT, PREDOMINANTLY PMN RARE GRAM POSITIVE COCCI IN PAIRS IN CLUSTERS    Culture   Final    FEW METHICILLIN RESISTANT STAPHYLOCOCCUS AUREUS FEW STREPTOCOCCUS PNEUMONIAE CULTURE REINCUBATED FOR BETTER GROWTH Performed at [facility], [place] [address] [place], [place] [address]    Report Status PENDING  Incomplete   Organism ID, Bacteria METHICILLIN RESISTANT STAPHYLOCOCCUS AUREUS  Final      Susceptibility   Methicillin resistant staphylococcus aureus - MIC*    CIPROFLOXACIN <=0.5 SENSITIVE Sensitive     ERYTHROMYCIN >=8 RESISTANT Resistant     GENTAMICIN <=0.5 SENSITIVE Sensitive     OXACILLIN >=4 RESISTANT Resistant     TETRACYCLINE <=1 SENSITIVE Sensitive     VANCOMYCIN 1 SENSITIVE Sensitive     TRIMETH/SULFA <=10 SENSITIVE Sensitive     CLINDAMYCIN <=0.25 SENSITIVE Sensitive     RIFAMPIN <=0.5 SENSITIVE Sensitive     Inducible Clindamycin NEGATIVE Sensitive     * FEW METHICILLIN RESISTANT STAPHYLOCOCCUS AUREUS    Anti-infectives:  Anti-infectives (From admission, onward)    Start     Dose/Rate Route Frequency  Ordered Stop   12/26/20 1945  ceFAZolin (ANCEF) IVPB 2g/100 mL premix        2 g 200 mL/hr over 30 Minutes Intravenous  Once 12/26/20 1932 12/26/20 2044       Best Practice/Protocols:  VTE Prophylaxis: Heparin (SQ) Continous Sedation  Consults: Treatment Team:  Dawley, Theodoro Doing, DO    Studies:    Events:  Subjective:    Overnight Issues:   Objective:  Vital signs for last 24 hours: Temp:  [98.1 F (36.7 C)-101.1 F (38.4 C)] 98.1 F (36.7 C) (10/13 0800) Pulse Rate:  [59-124] 73 (10/13 0900) Resp:  [0-31] 21 (10/13 0900) BP: (109-171)/(54-137) 129/80 (10/13 0900) SpO2:  [93 %-100 %] 96 % (10/13 0900) FiO2 (%):  [35 %-40 %] 35 % (10/13 0743) Weight:  [119.6 kg] 119.6 kg (10/13 0550)  Hemodynamic parameters for last 24 hours:    Intake/Output from previous day: 10/12 0701 - 10/13 0700 In: 4289.4 [I.V.:2879; NG/GT:1210.5; IV Piggyback:199.8] Out: 1065 [Urine:975; Emesis/NG output:90]  Intake/Output this shift: Total I/O In: 460 [I.V.:230; NG/GT:130; IV Piggyback:100] Out: -   Vent settings for last 24 hours: Vent Mode: PSV;CPAP FiO2 (%):  [35 %-40 %] 35 % Set Rate:  [20 bmp] 20 bmp Vt Set:  [924 mL] 620 mL PEEP:  [5 cmH20] 5 cmH20 Pressure Support:  [5 cmH20] 5  cmH20 Plateau Pressure:  [16 NIO27-03 cmH20] 21 cmH20  Physical Exam:  General: on vent Neuro: did F/C for me RUE HEENT/Neck: ETT Resp: clear to auscultation bilaterally CVS: RRR GI: soft, nontender, BS WNL, no r/g Extremities: edema 2+  Results for orders placed or performed during the hospital encounter of 12/26/20 (from the past 24 hour(s))  Glucose, capillary     Status: Abnormal   Collection Time: 12/31/20 11:23 AM  Result Value Ref Range   Glucose-Capillary 136 (H) 70 - 99 mg/dL  Glucose, capillary     Status: Abnormal   Collection Time: 12/31/20  3:46 PM  Result Value Ref Range   Glucose-Capillary 136 (H) 70 - 99 mg/dL  Glucose, capillary     Status: Abnormal   Collection Time:  12/31/20  7:28 PM  Result Value Ref Range   Glucose-Capillary 154 (H) 70 - 99 mg/dL  Glucose, capillary     Status: Abnormal   Collection Time: 12/31/20 11:13 PM  Result Value Ref Range   Glucose-Capillary 181 (H) 70 - 99 mg/dL  CBC     Status: Abnormal   Collection Time: 01/01/21  1:43 AM  Result Value Ref Range   WBC 9.7 4.0 - 10.5 K/uL   RBC 2.90 (L) 4.22 - 5.81 MIL/uL   Hemoglobin 9.4 (L) 13.0 - 17.0 g/dL   HCT 28.0 (L) 39.0 - 52.0 %   MCV 96.6 80.0 - 100.0 fL   MCH 32.4 26.0 - 34.0 pg   MCHC 33.6 30.0 - 36.0 g/dL   RDW 12.0 11.5 - 15.5 %   Platelets 205 150 - 400 K/uL   nRBC 0.0 0.0 - 0.2 %  Basic metabolic panel     Status: Abnormal   Collection Time: 01/01/21  1:43 AM  Result Value Ref Range   Sodium 140 135 - 145 mmol/L   Potassium 3.9 3.5 - 5.1 mmol/L   Chloride 109 98 - 111 mmol/L   CO2 24 22 - 32 mmol/L   Glucose, Bld 176 (H) 70 - 99 mg/dL   BUN 9 6 - 20 mg/dL   Creatinine, Ser 0.67 0.61 - 1.24 mg/dL   Calcium 8.2 (L) 8.9 - 10.3 mg/dL   GFR, Estimated >60 >60 mL/min   Anion gap 7 5 - 15  Glucose, capillary     Status: Abnormal   Collection Time: 01/01/21  3:20 AM  Result Value Ref Range   Glucose-Capillary 181 (H) 70 - 99 mg/dL  Glucose, capillary     Status: Abnormal   Collection Time: 01/01/21  7:31 AM  Result Value Ref Range   Glucose-Capillary 209 (H) 70 - 99 mg/dL    Assessment & Plan: Present on Admission:  TBI (traumatic brain injury)    LOS: 6 days   Additional comments:I reviewed the patient's new clinical lab test results. And MRI [facility]  TBI/B SAH and SDH/ICC/L IVH near vertex/hemorrhage along splenium - Dr. Reatha Armour; repeat head ct grossly stable, keppra x7d for sz ppx, CTA neck neg, MR brain per Dr. Reatha Armour for prognostication shows increase in subdural hygroma as well as DAI and ICC - Dr. Reatha Armour to discuss with family Acute hypoxic ventilator dependent respiratory failure - weaning this AM 5/5 Scalp hematoma Multiple facial abrasions DM -  A1c 8; SSI HX colon cancer HX HTN - on lisinopril at home HX R MCA CVA Substance abuse (opiates, cocaine, alcohol) - was on CIWA for alcohol withdrawal, now Dex and start [place], h/o MCS stent and stroke -  hold C-collar - remove FEN - TF, lasix x 1 VTE prophylaxis - SCDs, SQH started 10/9 per [place] - ICU I updated his daughter at the bedside and his brother on the phone. Critical Care Total Time*: 42 Minutes  Georganna Skeans, MD, MPH, FACS Trauma & General Surgery Use [email or web address] to contact on call provider  01/01/2021  *Care during the described time interval was provided by me. I have reviewed this patient's available data, including medical history, events of note, physical examination and test results as part of my evaluation.
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
## 2021-01-02 LAB — BASIC METABOLIC PANEL
Anion gap: 6 (ref 5–15)
BUN: 12 mg/dL (ref 6–20)
CO2: 26 mmol/L (ref 22–32)
Calcium: 8.4 mg/dL — ABNORMAL LOW (ref 8.9–10.3)
Chloride: 107 mmol/L (ref 98–111)
Creatinine, Ser: 0.77 mg/dL (ref 0.61–1.24)
GFR, Estimated: >60 mL/min (ref >60)
Glucose, Bld: 253 mg/dL — ABNORMAL HIGH (ref 70–99)
Potassium: 3.9 mmol/L (ref 3.5–5.1)
Sodium: 139 mmol/L (ref 135–145)

## 2021-01-02 LAB — CBC
HCT: 27.4 % — ABNORMAL LOW (ref 39.0–52.0)
Hemoglobin: 9 g/dL — ABNORMAL LOW (ref 13.0–17.0)
MCH: 31.9 pg (ref 26.0–34.0)
MCHC: 32.8 g/dL (ref 30.0–36.0)
MCV: 97.2 fL (ref 80.0–100.0)
Platelets: 227 10*3/uL (ref 150–400)
RBC: 2.82 MIL/uL — ABNORMAL LOW (ref 4.22–5.81)
RDW: 12.2 % (ref 11.5–15.5)
WBC: 9.3 10*3/uL (ref 4.0–10.5)
nRBC: 0 % (ref 0.0–0.2)

## 2021-01-02 LAB — GLUCOSE, CAPILLARY
Glucose-Capillary: 230 mg/dL — ABNORMAL HIGH (ref 70–99)
Glucose-Capillary: 208 mg/dL — ABNORMAL HIGH (ref 70–99)
Glucose-Capillary: 209 mg/dL — ABNORMAL HIGH (ref 70–99)
Glucose-Capillary: 227 mg/dL — ABNORMAL HIGH (ref 70–99)
Glucose-Capillary: 217 mg/dL — ABNORMAL HIGH (ref 70–99)

## 2021-01-02 MED ORDER — ALBUTEROL SULFATE (2.5 MG/3ML) 0.083% IN NEBU
2.5000 mg | INHALATION_SOLUTION | RESPIRATORY_TRACT | Status: DC | PRN
  Administered 2021-01-02 – 2021-01-05 (×2): 2.5 mg via RESPIRATORY_TRACT
  Filled 2021-01-02 (×2): qty 3

## 2021-01-02 MED ORDER — METOPROLOL TARTRATE 5 MG/5ML IV SOLN
5.0000 mg | Freq: Four times a day (QID) | INTRAVENOUS | Status: AC | PRN
  Administered 2021-01-04 – 2021-01-05 (×4): 5 mg via INTRAVENOUS
  Filled 2021-01-02 (×4): qty 5

## 2021-01-02 MED ORDER — VANCOMYCIN HCL 1750 MG/350ML IV SOLN
1750.0000 mg | Freq: Two times a day (BID) | INTRAVENOUS | Status: DC
  Administered 2021-01-03 (×2): 1750 mg via INTRAVENOUS
  Filled 2021-01-02 (×2): qty 350

## 2021-01-02 MED ORDER — VANCOMYCIN HCL 10 G IV SOLR
2500.0000 mg | Freq: Once | INTRAVENOUS | Status: AC
  Administered 2021-01-02: 2500 mg via INTRAVENOUS
  Filled 2021-01-02: qty 2500

## 2021-01-02 NOTE — Progress Notes (Signed)
   Providing Compassionate, Quality Care - Together  NEUROSURGERY PROGRESS NOTE   S: No issues overnight.   O: EXAM:  BP (!) 156/78   Pulse 86   Temp 98.8 F (37.1 C) (Axillary)   Resp (!) 23   Ht 5\' 10"  (1.778 m)   Wt 124.8 kg   SpO2 96%   BMI 39.48 kg/m   Intubated Eyes closed PERRL MAE to pain, loc in RUE  ASSESSMENT:  59 y.o. male with   TBI with DAI   PLAN: - will obtain CT in am for stability - I do not believe drainage of the hygroma/subdural is indicated without shift at this time -mod to severe TBI with DAI -rec trach peg if no ability to wean ET by Monday -cont supportive care    Thank you for allowing me to participate in this patient's care.  Please do not hesitate to call with questions or concerns.   Elwin Sleight, [facility] Cell: [phone]
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
## 2021-01-02 NOTE — Progress Notes (Signed)
Inpatient Diabetes Program Recommendations  AACE/ADA: New Consensus Statement on Inpatient Glycemic Control (2015)  Target Ranges:  Prepandial:   less than 140 mg/dL      Peak postprandial:   less than 180 mg/dL (1-2 hours)      Critically ill patients:  140 - 180 mg/dL   Lab Results  Component Value Date   GLUCAP 209 (H) 01/02/2021   HGBA1C 8.0 (H) 12/26/2020    Review of Glycemic Control Results for Scott Day, Scott Day (MRN [id number]) as of 01/02/2021 13:53  Ref. Range 01/01/2021 19:19 01/01/2021 23:09 01/02/2021 03:25 01/02/2021 08:00 01/02/2021 11:52  Glucose-Capillary Latest Ref Range: 70 - 99 mg/dL 213 (H) 214 (H) 230 (H) 208 (H) 209 (H)   Diabetes history: DM 2 Outpatient Diabetes medications:  Metformin 250 mg bid Current orders for Inpatient glycemic control: Pivot 1.5 cal 65 ml/hr Novolog moderate q 4 hours Inpatient Diabetes Program Recommendations:   Please consider adding Novolog tube feed coverage 3 units q 4 hours.   Thanks,  Adah Perl, RN, BC-ADM Inpatient Diabetes Coordinator Pager [phone]  (8a-5p)

## 2021-01-02 NOTE — Progress Notes (Signed)
Pharmacy Antibiotic Note  Scott Day is a 60 y.o. male admitted on 12/26/2020 with  MRSA pneumonia .  Pharmacy has been consulted for vancomycin dosing.  WBC wnl, SCr wnl. Trach aspirate growing few MRSA and few strep pneumoniae.   Plan: -Vancomycin 2500 mg IV load followed by Vancomycin 1750 mg IV Q 12 hrs. Goal AUC 400-550. Expected AUC: 485 SCr used: 0.77  -Monitor CBC, renal fx, cultures and clinical progress -Vanc levels as indicated   Height: 5\' 10"  (177.8 cm) Weight: 124.8 kg (275 lb 2.2 oz) IBW/kg (Calculated) : 73  Temp (24hrs), Avg:99.7 F (37.6 C), Min:98.8 F (37.1 C), Max:100.6 F (38.1 C)  Recent Labs  Lab 12/26/20 1845 12/26/20 1856 12/29/20 0233 12/30/20 0918 12/31/20 0433 01/01/21 0143 01/02/21 0355 01/02/21 0625  WBC 11.5*   < > 6.7 7.8 7.3 9.7  --  9.3  CREATININE  --    < > 0.92 0.83 0.76 0.67 0.77  --   LATICACIDVEN 3.0*  --   --   --   --   --   --   --    < > = values in this interval not displayed.    Estimated Creatinine Clearance: 130.1 mL/min (by C-G formula based on SCr of 0.77 mg/dL).    No Known Allergies  Antimicrobials this admission: Vancomycin 10/14 >>   Dose adjustments this admission:   Microbiology results: 10/11 TA >> few MRSA, Few strep pneumo   Thank you for allowing pharmacy to be a part of this patient's care.  Albertina Parr, PharmD., BCPS, BCCCP Clinical Pharmacist Please refer to [facility] for unit-specific pharmacist

## 2021-01-03 ENCOUNTER — Inpatient Hospital Stay (HOSPITAL_COMMUNITY): Payer: Medicaid Other

## 2021-01-03 LAB — BASIC METABOLIC PANEL
Anion gap: 6 (ref 5–15)
BUN: 13 mg/dL (ref 6–20)
CO2: 26 mmol/L (ref 22–32)
Calcium: 8.3 mg/dL — ABNORMAL LOW (ref 8.9–10.3)
Chloride: 108 mmol/L (ref 98–111)
Creatinine, Ser: 0.71 mg/dL (ref 0.61–1.24)
GFR, Estimated: >60 mL/min (ref >60)
Glucose, Bld: 225 mg/dL — ABNORMAL HIGH (ref 70–99)
Potassium: 3.6 mmol/L (ref 3.5–5.1)
Sodium: 140 mmol/L (ref 135–145)

## 2021-01-03 LAB — GLUCOSE, CAPILLARY
Glucose-Capillary: 195 mg/dL — ABNORMAL HIGH (ref 70–99)
Glucose-Capillary: 198 mg/dL — ABNORMAL HIGH (ref 70–99)
Glucose-Capillary: 223 mg/dL — ABNORMAL HIGH (ref 70–99)
Glucose-Capillary: 224 mg/dL — ABNORMAL HIGH (ref 70–99)
Glucose-Capillary: 227 mg/dL — ABNORMAL HIGH (ref 70–99)
Glucose-Capillary: 231 mg/dL — ABNORMAL HIGH (ref 70–99)
Glucose-Capillary: 241 mg/dL — ABNORMAL HIGH (ref 70–99)

## 2021-01-03 LAB — CBC
HCT: 27.1 % — ABNORMAL LOW (ref 39.0–52.0)
Hemoglobin: 8.6 g/dL — ABNORMAL LOW (ref 13.0–17.0)
MCH: 31.2 pg (ref 26.0–34.0)
MCHC: 31.7 g/dL (ref 30.0–36.0)
MCV: 98.2 fL (ref 80.0–100.0)
Platelets: 241 10*3/uL (ref 150–400)
RBC: 2.76 MIL/uL — ABNORMAL LOW (ref 4.22–5.81)
RDW: 12.3 % (ref 11.5–15.5)
WBC: 8.5 10*3/uL (ref 4.0–10.5)
nRBC: 0 % (ref 0.0–0.2)

## 2021-01-03 LAB — CULTURE, RESPIRATORY W GRAM STAIN

## 2021-01-03 MED ORDER — FUROSEMIDE 10 MG/ML IJ SOLN
20.0000 mg | Freq: Once | INTRAMUSCULAR | Status: AC
  Administered 2021-01-03: 20 mg via INTRAVENOUS
  Filled 2021-01-03: qty 2

## 2021-01-03 MED ORDER — VANCOMYCIN HCL 1250 MG/250ML IV SOLN
1250.0000 mg | Freq: Two times a day (BID) | INTRAVENOUS | Status: DC
  Administered 2021-01-04 – 2021-01-06 (×5): 1250 mg via INTRAVENOUS
  Filled 2021-01-03 (×5): qty 250

## 2021-01-03 MED ORDER — VANCOMYCIN HCL 1500 MG/300ML IV SOLN: 1500.0000 mg | Freq: Two times a day (BID) | INTRAVENOUS | Status: DC

## 2021-01-03 MED ORDER — INSULIN ASPART 100 UNIT/ML IJ SOLN
3.0000 [IU] | INTRAMUSCULAR | Status: DC
  Administered 2021-01-03 – 2021-01-04 (×7): 3 [IU] via SUBCUTANEOUS
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
## 2021-01-04 ENCOUNTER — Inpatient Hospital Stay (HOSPITAL_COMMUNITY): Payer: Medicaid Other

## 2021-01-04 LAB — BASIC METABOLIC PANEL
Anion gap: 6 (ref 5–15)
BUN: 14 mg/dL (ref 6–20)
CO2: 27 mmol/L (ref 22–32)
Calcium: 8.1 mg/dL — ABNORMAL LOW (ref 8.9–10.3)
Chloride: 108 mmol/L (ref 98–111)
Creatinine, Ser: 0.67 mg/dL (ref 0.61–1.24)
GFR, Estimated: >60 mL/min (ref >60)
Glucose, Bld: 198 mg/dL — ABNORMAL HIGH (ref 70–99)
Potassium: 3.8 mmol/L (ref 3.5–5.1)
Sodium: 141 mmol/L (ref 135–145)

## 2021-01-04 LAB — CBC
HCT: 26.2 % — ABNORMAL LOW (ref 39.0–52.0)
Hemoglobin: 8.6 g/dL — ABNORMAL LOW (ref 13.0–17.0)
MCH: 32 pg (ref 26.0–34.0)
MCHC: 32.8 g/dL (ref 30.0–36.0)
MCV: 97.4 fL (ref 80.0–100.0)
Platelets: 244 10*3/uL (ref 150–400)
RBC: 2.69 MIL/uL — ABNORMAL LOW (ref 4.22–5.81)
RDW: 12.3 % (ref 11.5–15.5)
WBC: 8.4 10*3/uL (ref 4.0–10.5)
nRBC: 0 % (ref 0.0–0.2)

## 2021-01-04 LAB — GLUCOSE, CAPILLARY
Glucose-Capillary: 203 mg/dL — ABNORMAL HIGH (ref 70–99)
Glucose-Capillary: 217 mg/dL — ABNORMAL HIGH (ref 70–99)
Glucose-Capillary: 245 mg/dL — ABNORMAL HIGH (ref 70–99)
Glucose-Capillary: 212 mg/dL — ABNORMAL HIGH (ref 70–99)
Glucose-Capillary: 219 mg/dL — ABNORMAL HIGH (ref 70–99)
Glucose-Capillary: 231 mg/dL — ABNORMAL HIGH (ref 70–99)

## 2021-01-04 MED ORDER — INSULIN ASPART 100 UNIT/ML IJ SOLN
5.0000 [IU] | INTRAMUSCULAR | Status: DC
  Administered 2021-01-04 – 2021-01-12 (×33): 5 [IU] via SUBCUTANEOUS

## 2021-01-04 MED ORDER — FUROSEMIDE 10 MG/ML IJ SOLN
40.0000 mg | Freq: Once | INTRAMUSCULAR | Status: AC
  Administered 2021-01-04: 40 mg via INTRAVENOUS
  Filled 2021-01-04: qty 4
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
## 2021-01-04 NOTE — Progress Notes (Signed)
Pt went to CT on 100% O2 and back to 4N 24. Vitals are stable and vent setting reset to previous settings: PRVC 40%  +5  20  620.

## 2021-01-04 NOTE — Progress Notes (Addendum)
Patient ID: Scott Day, male   DOB: 1960-07-23, 60 y.o.   MRN: [id number] Follow up - Trauma Critical Care  Patient Details:    Scott Day is an 60 y.o. male.  Lines/tubes : Airway 7.5 mm (Active)  Secured at (cm) 25 cm 01/02/21 0744  Measured From Lips 01/02/21 [place] 01/02/21 0744  Secured By [employer] 01/02/21 0744  Tube Holder Repositioned Yes 01/02/21 0744  Prone position No 01/02/21 0322  Cuff Pressure (cm H2O) [place] OR 18-26 [facility] 01/02/21 0744  Site Condition Dry 01/02/21 0744     External Urinary Catheter (Active)  Collection Container Dedicated Suction Canister 01/02/21 0800  Suction (Verified suction is between 40-80 mmHg) Yes 01/02/21 0800  Securement Method Securing device (Describe) 01/02/21 0600  Site Assessment Clean;Intact 01/02/21 0800  Intervention Male External Urinary Catheter Replaced 01/02/21 0600  Output (mL) 200 mL 01/02/21 0400    Microbiology/Sepsis markers: Results for orders placed or performed during the hospital encounter of 12/26/20  Resp Panel by RT-PCR (Flu A&B, Covid) Nasopharyngeal Swab     Status: None   Collection Time: 12/26/20  6:55 PM   Specimen: Nasopharyngeal Swab; Nasopharyngeal(NP) swabs in vial transport medium  Result Value Ref Range Status   SARS Coronavirus 2 by RT PCR NEGATIVE NEGATIVE Final    Comment: (NOTE) SARS-CoV-2 target nucleic acids are NOT DETECTED.  The SARS-CoV-2 RNA is generally detectable in upper respiratory specimens during the acute phase of infection. The lowest concentration of SARS-CoV-2 viral copies this assay can detect is 138 copies/mL. A negative result does not preclude SARS-Cov-2 infection and should not be used as the sole basis for treatment or other patient management decisions. A negative result may occur with  improper specimen collection/handling, submission of specimen other than nasopharyngeal swab, presence of viral mutation(s) within  the areas targeted by this assay, and inadequate number of viral copies(<138 copies/mL). A negative result must be combined with clinical observations, patient history, and epidemiological information. The expected result is Negative.  Fact Sheet for Patients:  EntrepreneurPulse.com.au  Fact Sheet for Healthcare Providers:  IncredibleEmployment.be  This test is no t yet approved or cleared by the [place] FDA and  has been authorized for detection and/or diagnosis of SARS-CoV-2 by FDA under an Emergency Use Authorization (EUA). This EUA will remain  in effect (meaning this test can be used) for the duration of the COVID-19 declaration under Section 564(b)(1) of the Act, 21 U.S.C.section 360bbb-3(b)(1), unless the authorization is terminated  or revoked sooner.       Influenza A by PCR NEGATIVE NEGATIVE Final   Influenza B by PCR NEGATIVE NEGATIVE Final    Comment: (NOTE) The Xpert Xpress SARS-CoV-2/FLU/RSV plus assay is intended as an aid in the diagnosis of influenza from Nasopharyngeal swab specimens and should not be used as a sole basis for treatment. Nasal washings and aspirates are unacceptable for Xpert Xpress SARS-CoV-2/FLU/RSV testing.  Fact Sheet for Patients: EntrepreneurPulse.com.au  Fact Sheet for Healthcare Providers: IncredibleEmployment.be  This test is not yet approved or cleared by the [place] FDA and has been authorized for detection and/or diagnosis of SARS-CoV-2 by FDA under an Emergency Use Authorization (EUA). This EUA will remain in effect (meaning this test can be used) for the duration of the COVID-19 declaration under Section 564(b)(1) of the Act, 21 U.S.C. section 360bbb-3(b)(1), unless the authorization is terminated or revoked.  Performed at [facility], [place] [address], [place], [place] [address]  MRSA Next Gen by PCR, Nasal     Status: Abnormal    Collection Time: 12/27/20 12:14 AM   Specimen: Nasal Mucosa; Nasal Swab  Result Value Ref Range Status   MRSA by PCR Next Gen DETECTED (A) NOT DETECTED Final    Comment: RESULT CALLED TO, READ BACK BY AND VERIFIED WITH: RN TAYLOR P. 12/27/20@1 :26 BY TW (NOTE) The GeneXpert MRSA Assay (FDA approved for NASAL specimens only), is one component of a comprehensive MRSA colonization surveillance program. It is not intended to diagnose MRSA infection nor to guide or monitor treatment for MRSA infections. Test performance is not FDA approved in patients less than 17 years old. Performed at [facility], [place] [address] [place], [place] [address]   Culture, Respiratory w Gram Stain     Status: None   Collection Time: 12/30/20 10:20 AM   Specimen: Tracheal Aspirate; Respiratory  Result Value Ref Range Status   Specimen Description TRACHEAL ASPIRATE  Final   Special Requests NONE  Final   Gram Stain   Final    FEW WBC PRESENT, PREDOMINANTLY PMN RARE GRAM POSITIVE COCCI IN PAIRS IN CLUSTERS Performed at [facility], [address] [place], [place] [address]    Culture   Final    FEW METHICILLIN RESISTANT STAPHYLOCOCCUS AUREUS FEW STREPTOCOCCUS PNEUMONIAE    Report Status 01/03/2021 FINAL  Final   Organism ID, Bacteria METHICILLIN RESISTANT STAPHYLOCOCCUS AUREUS  Final   Organism ID, Bacteria STREPTOCOCCUS PNEUMONIAE  Final      Susceptibility   Methicillin resistant staphylococcus aureus - MIC*    CIPROFLOXACIN <=0.5 SENSITIVE Sensitive     ERYTHROMYCIN >=8 RESISTANT Resistant     GENTAMICIN <=0.5 SENSITIVE Sensitive     OXACILLIN >=4 RESISTANT Resistant     TETRACYCLINE <=1 SENSITIVE Sensitive     VANCOMYCIN 1 SENSITIVE Sensitive     TRIMETH/SULFA <=10 SENSITIVE Sensitive     CLINDAMYCIN <=0.25 SENSITIVE Sensitive     RIFAMPIN <=0.5 SENSITIVE Sensitive     Inducible Clindamycin NEGATIVE Sensitive     * FEW METHICILLIN RESISTANT STAPHYLOCOCCUS AUREUS   Streptococcus  pneumoniae - MIC*    ERYTHROMYCIN <=0.12 SENSITIVE Sensitive     LEVOFLOXACIN 1 SENSITIVE Sensitive     VANCOMYCIN 0.5 SENSITIVE Sensitive     PENO - penicillin <=0.06      PENICILLIN (non-meningitis) <=0.06 SENSITIVE Sensitive     PENICILLIN (oral) <=0.06 SENSITIVE Sensitive     CEFTRIAXONE (non-meningitis) <=0.12 SENSITIVE Sensitive     * FEW STREPTOCOCCUS PNEUMONIAE    Anti-infectives:  Anti-infectives (From admission, onward)    Start     Dose/Rate Route Frequency Ordered Stop   01/04/21 0000  vancomycin (VANCOREADY) IVPB 1500 mg/300 mL  Status:  Discontinued        1,500 mg 150 mL/hr over 120 Minutes Intravenous Every 12 hours 01/03/21 1149 01/03/21 1153   01/04/21 0000  vancomycin (VANCOREADY) IVPB 1250 mg/250 mL        1,250 mg 166.7 mL/hr over 90 Minutes Intravenous Every 12 hours 01/03/21 1153     01/03/21 0000  vancomycin (VANCOREADY) IVPB 1750 mg/350 mL  Status:  Discontinued        1,750 mg 175 mL/hr over 120 Minutes Intravenous Every 12 hours 01/02/21 1347 01/03/21 1149   01/02/21 1045  vancomycin (VANCOCIN) 2,500 mg in sodium chloride 0.9 % 500 mL IVPB        2,500 mg 250 mL/hr over 120 Minutes Intravenous  Once 01/02/21 0959  01/02/21 1430   12/26/20 1945  ceFAZolin (ANCEF) IVPB 2g/100 mL premix        2 g 200 mL/hr over 30 Minutes Intravenous  Once 12/26/20 1932 12/26/20 2044       Best Practice/Protocols:  VTE Prophylaxis: Heparin (SQ) Continous Sedation  Consults: Treatment Team:  Dawley, Troy C, DO    Studies:    Events:  Subjective:    Overnight Issues: No acute changes. Having thick secretions, chest PT started yesterday afternoon.  Objective:  Vital signs for last 24 hours: Temp:  [98.4 F (36.9 C)-100.5 F (38.1 C)] 98.4 F (36.9 C) (10/16 0400) Pulse Rate:  [59-87] 74 (10/16 0700) Resp:  [20-28] 21 (10/16 0700) BP: (120-172)/(57-141) 143/70 (10/16 0700) SpO2:  [94 %-99 %] 98 % (10/16 0700) FiO2 (%):  [40 %] 40 % (10/16  0325)  Hemodynamic parameters for last 24 hours:    Intake/Output from previous day: 10/15 0701 - 10/16 0700 In: 4895.3 [I.V.:2972.6; NG/GT:1322.8; IV Piggyback:600] Out: 1900 [Urine:1900]  Intake/Output this shift: No intake/output data recorded.  Vent settings for last 24 hours: Vent Mode: PRVC FiO2 (%):  [40 %] 40 % Set Rate:  [20 bmp] 20 bmp Vt Set:  [532 mL] 620 mL PEEP:  [5 cmH20] 5 cmH20 Plateau Pressure:  [25 cmH20-30 cmH20] 28 cmH20  Physical Exam:  General: on vent Neuro: some spont movement but does not follow commands HEENT/Neck: ETT Resp: clear to auscultation bilaterally CVS: RRR GI: soft, nontender, BS WNL, no r/g Extremities: edema  Results for orders placed or performed during the hospital encounter of 12/26/20 (from the past 24 hour(s))  Glucose, capillary     Status: Abnormal   Collection Time: 01/03/21  7:59 AM  Result Value Ref Range   Glucose-Capillary 224 (H) 70 - 99 mg/dL  Glucose, capillary     Status: Abnormal   Collection Time: 01/03/21 11:18 AM  Result Value Ref Range   Glucose-Capillary 241 (H) 70 - 99 mg/dL  Glucose, capillary     Status: Abnormal   Collection Time: 01/03/21  3:44 PM  Result Value Ref Range   Glucose-Capillary 223 (H) 70 - 99 mg/dL  Glucose, capillary     Status: Abnormal   Collection Time: 01/03/21  8:17 PM  Result Value Ref Range   Glucose-Capillary 195 (H) 70 - 99 mg/dL  Glucose, capillary     Status: Abnormal   Collection Time: 01/03/21 11:52 PM  Result Value Ref Range   Glucose-Capillary 231 (H) 70 - 99 mg/dL  CBC     Status: Abnormal   Collection Time: 01/04/21  2:56 AM  Result Value Ref Range   WBC 8.4 4.0 - 10.5 K/uL   RBC 2.69 (L) 4.22 - 5.81 MIL/uL   Hemoglobin 8.6 (L) 13.0 - 17.0 g/dL   HCT 26.2 (L) 39.0 - 52.0 %   MCV 97.4 80.0 - 100.0 fL   MCH 32.0 26.0 - 34.0 pg   MCHC 32.8 30.0 - 36.0 g/dL   RDW 12.3 11.5 - 15.5 %   Platelets 244 150 - 400 K/uL   nRBC 0.0 0.0 - 0.2 %  Basic metabolic panel      Status: Abnormal   Collection Time: 01/04/21  2:56 AM  Result Value Ref Range   Sodium 141 135 - 145 mmol/L   Potassium 3.8 3.5 - 5.1 mmol/L   Chloride 108 98 - 111 mmol/L   CO2 27 22 - 32 mmol/L   Glucose, Bld 198 (H) 70 - 99  mg/dL   BUN 14 6 - 20 mg/dL   Creatinine, Ser 0.67 0.61 - 1.24 mg/dL   Calcium 8.1 (L) 8.9 - 10.3 mg/dL   GFR, Estimated >60 >60 mL/min   Anion gap 6 5 - 15  Glucose, capillary     Status: Abnormal   Collection Time: 01/04/21  4:16 AM  Result Value Ref Range   Glucose-Capillary 203 (H) 70 - 99 mg/dL    Assessment & Plan: Present on Admission:  TBI (traumatic brain injury)    LOS: 9 days   Additional comments:I reviewed the patient's new clinical lab test results. . MCC  TBI/B SAH and SDH/ICC/L IVH near vertex/hemorrhage along splenium - Dr. Reatha Armour; repeat head ct grossly stable, keppra x7d for sz ppx, CTA neck neg, MR brain per Dr. 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 Repeat head CT shows mild increase in size of hygroma, will follow up neurosurgery recommendations. Acute hypoxic ventilator dependent respiratory failure - slow vent wean, mental status slowly improving. PSV trials today. Chest PT to help clear secretions. Scalp hematoma Multiple facial abrasions DM - A1c 8; SSI HX colon cancer HX HTN - on lisinopril at home HX R MCA CVA Substance abuse (opiates, cocaine, alcohol) - was on CIWA for alcohol withdrawal, now Dex and Klon ID - Vanc for MRSA pneumonia On Brilinta, h/o MCS stent and stroke - hold C-collar - remove FEN - TF, SLIV. Give lasix 40mg  x1 dose today. VTE prophylaxis - SCDs, SQH started 10/9 per [place] - ICU I updated family member at [place] Total Time*: [place], MD [facility] 01/04/21 7:22 AM   *Care during the described time interval was provided by me. I have reviewed this patient's available data, including medical  history, events of note, physical examination and test results as part of my evaluation.

## 2021-01-05 ENCOUNTER — Inpatient Hospital Stay (HOSPITAL_COMMUNITY): Payer: Medicaid Other

## 2021-01-05 LAB — POCT I-STAT 7, (LYTES, BLD GAS, ICA,H+H)
Acid-Base Excess: 7 mmol/L — ABNORMAL HIGH (ref 0.0–2.0)
Bicarbonate: 32.5 mmol/L — ABNORMAL HIGH (ref 20.0–28.0)
Calcium, Ion: 1.23 mmol/L (ref 1.15–1.40)
HCT: 29 % — ABNORMAL LOW (ref 39.0–52.0)
Hemoglobin: 9.9 g/dL — ABNORMAL LOW (ref 13.0–17.0)
O2 Saturation: 98 %
Patient temperature: 36.7
Potassium: 3.6 mmol/L (ref 3.5–5.1)
Sodium: 142 mmol/L (ref 135–145)
TCO2: 34 mmol/L — ABNORMAL HIGH (ref 22–32)
pCO2 arterial: 50.6 mmHg — ABNORMAL HIGH (ref 32.0–48.0)
pH, Arterial: 7.415 (ref 7.350–7.450)
pO2, Arterial: 110 mmHg — ABNORMAL HIGH (ref 83.0–108.0)

## 2021-01-05 LAB — GLUCOSE, CAPILLARY
Glucose-Capillary: 218 mg/dL — ABNORMAL HIGH (ref 70–99)
Glucose-Capillary: 242 mg/dL — ABNORMAL HIGH (ref 70–99)
Glucose-Capillary: 176 mg/dL — ABNORMAL HIGH (ref 70–99)
Glucose-Capillary: 150 mg/dL — ABNORMAL HIGH (ref 70–99)
Glucose-Capillary: 130 mg/dL — ABNORMAL HIGH (ref 70–99)
Glucose-Capillary: 190 mg/dL — ABNORMAL HIGH (ref 70–99)

## 2021-01-05 LAB — VANCOMYCIN, PEAK: Vancomycin Pk: 23 ug/mL — ABNORMAL LOW (ref 30–40)

## 2021-01-05 MED ORDER — HYDRALAZINE HCL 20 MG/ML IJ SOLN
10.0000 mg | Freq: Four times a day (QID) | INTRAMUSCULAR | Status: DC | PRN
  Administered 2021-01-05 – 2021-01-07 (×4): 10 mg via INTRAVENOUS
  Filled 2021-01-05 (×4): qty 1

## 2021-01-05 MED ORDER — METOPROLOL TARTRATE 5 MG/5ML IV SOLN
10.0000 mg | Freq: Once | INTRAVENOUS | Status: AC
  Administered 2021-01-05: 10 mg via INTRAVENOUS
  Filled 2021-01-05: qty 10

## 2021-01-05 MED ORDER — FUROSEMIDE 10 MG/ML IJ SOLN
20.0000 mg | Freq: Once | INTRAMUSCULAR | Status: AC
  Administered 2021-01-05: 20 mg via INTRAVENOUS
  Filled 2021-01-05: qty 2

## 2021-01-05 MED ORDER — IPRATROPIUM-ALBUTEROL 0.5-2.5 (3) MG/3ML IN SOLN
3.0000 mL | Freq: Four times a day (QID) | RESPIRATORY_TRACT | Status: DC
  Administered 2021-01-05 – 2021-01-09 (×15): 3 mL via RESPIRATORY_TRACT
  Filled 2021-01-05 (×15): qty 3

## 2021-01-05 MED ORDER — FUROSEMIDE 10 MG/ML IJ SOLN
40.0000 mg | Freq: Once | INTRAMUSCULAR | Status: AC
  Administered 2021-01-05: 40 mg via INTRAVENOUS
  Filled 2021-01-05: qty 4

## 2021-01-05 NOTE — Progress Notes (Signed)
   Providing Compassionate, Quality Care - Together  NEUROSURGERY PROGRESS NOTE   S: No issues overnight. Plan to extubate today, much more interactive per daughter  O: EXAM:  BP (!) 165/80   Pulse 82   Temp 99 F (37.2 C) (Axillary)   Resp (!) 21   Ht 5\' 10"  (1.778 m)   Wt 125.4 kg   SpO2 91%   BMI 39.67 kg/m   Intubated Eyes open spont PERRL MAE purposefully,. Fcx4   ASSESSMENT:  60 y.o. male with   TBI with DAI R Hygroma  PLAN: - cont wean off vent, trach /peg if fails -CT reviewed, no Significant mass effect.  Would recommend continuing to monitor.    Thank you for allowing me to participate in this patient's care.  Please do not hesitate to call with questions or concerns.   Elwin Sleight, [facility] Cell: [phone]

## 2021-01-05 NOTE — Procedures (Signed)
Extubation Procedure Note  Patient Details:   Name: Scott Day DOB: Feb 24, 1961 MRN: [id number]   Airway Documentation:    Vent end date: 01/05/21 Vent end time: 0900   Evaluation  O2 sats: stable throughout Complications: No apparent complications Patient did tolerate procedure well. Bilateral Breath Sounds: Rhonchi, Diminished   Yes  Patient extubated per MD order. Positive cuff leak. No stridor noted. Patient a little drowsy. Patient attempting to cough. Patient sats 92% on 5L [place]. RN at bedside. MD aware.  Nayely Dingus H Kekai Geter 01/05/2021, 9:00 AM

## 2021-01-05 NOTE — Progress Notes (Signed)
Pt continues to have SBP > 200 and HR in the 120's -140's despite hydralazine and lasix. Trauma MD notified and aware. Ordered metoprolol, will continue to monitor.

## 2021-01-05 NOTE — Progress Notes (Signed)
Called Trauma concerning pt's increased work of breathing since report. Pt was extubated this AM and is currently on 5L [place]. Plan is to place oxygen mask on pt and get ABG. Will continue to monitor pt's status.  Pt's SBP in the 170's to 180's; requested PRN for patient.

## 2021-01-05 NOTE — Progress Notes (Signed)
Patient ID: Scott Day, male   DOB: [birth year], 60 y.o.   MRN: [id number] Follow up - Trauma Critical Care  Patient Details:    Scott Day is an 60 y.o. male.  Lines/tubes : Airway 7.5 mm (Active)  Secured at (cm) 26 cm 01/05/21 0800  Measured From Lips 01/05/21 0800  Secured Location Right 01/05/21 0800  Secured By [employer] 01/05/21 0800  Tube Holder Repositioned Yes 01/05/21 0800  Prone position No 01/05/21 0333  Cuff Pressure (cm H2O) Clear OR 27-39 CmH2O 01/05/21 0800  Site Condition Cool;Dry 01/05/21 0800     Flatus Tube/Pouch (Active)  Daily care Skin around tube assessed 01/04/21 2359  Output (mL) 225 mL 01/05/21 0537     External Urinary Catheter (Active)  Collection Container Dedicated Suction Canister 01/05/21 0333  Suction (Verified suction is between 40-80 mmHg) Yes 01/05/21 0333  Securement Method Tape 01/05/21 0333  Site Assessment Clean;Intact;Dry 01/05/21 0333  Intervention Male External Urinary Catheter Replaced 01/04/21 1700  Output (mL) 800 mL 01/05/21 0500    Microbiology/Sepsis markers: Results for orders placed or performed during the hospital encounter of 12/26/20  Resp Panel by RT-PCR (Flu A&B, Covid) Nasopharyngeal Swab     Status: None   Collection Time: 12/26/20  6:55 PM   Specimen: Nasopharyngeal Swab; Nasopharyngeal(NP) swabs in vial transport medium  Result Value Ref Range Status   SARS Coronavirus 2 by RT PCR NEGATIVE NEGATIVE Final    Comment: (NOTE) SARS-CoV-2 target nucleic acids are NOT DETECTED.  The SARS-CoV-2 RNA is generally detectable in upper respiratory specimens during the acute phase of infection. The lowest concentration of SARS-CoV-2 viral copies this assay can detect is 138 copies/mL. A negative result does not preclude SARS-Cov-2 infection and should not be used as the sole basis for treatment or other patient management decisions. A negative result may occur with  improper specimen  collection/handling, submission of specimen other than nasopharyngeal swab, presence of viral mutation(s) within the areas targeted by this assay, and inadequate number of viral copies(<138 copies/mL). A negative result must be combined with clinical observations, patient history, and epidemiological information. The expected result is Negative.  Fact Sheet for Patients:  EntrepreneurPulse.com.au  Fact Sheet for Healthcare Providers:  IncredibleEmployment.be  This test is no t yet approved or cleared by the [place] FDA and  has been authorized for detection and/or diagnosis of SARS-CoV-2 by FDA under an Emergency Use Authorization (EUA). This EUA will remain  in effect (meaning this test can be used) for the duration of the COVID-19 declaration under Section 564(b)(1) of the Act, 21 U.S.C.section 360bbb-3(b)(1), unless the authorization is terminated  or revoked sooner.       Influenza A by PCR NEGATIVE NEGATIVE Final   Influenza B by PCR NEGATIVE NEGATIVE Final    Comment: (NOTE) The Xpert Xpress SARS-CoV-2/FLU/RSV plus assay is intended as an aid in the diagnosis of influenza from Nasopharyngeal swab specimens and should not be used as a sole basis for treatment. Nasal washings and aspirates are unacceptable for Xpert Xpress SARS-CoV-2/FLU/RSV testing.  Fact Sheet for Patients: EntrepreneurPulse.com.au  Fact Sheet for Healthcare Providers: IncredibleEmployment.be  This test is not yet approved or cleared by the [place] FDA and has been authorized for detection and/or diagnosis of SARS-CoV-2 by FDA under an Emergency Use Authorization (EUA). This EUA will remain in effect (meaning this test can be used) for the duration of the COVID-19 declaration under Section 564(b)(1) of the Act, 21 U.S.C. section 360bbb-3(b)(1),  unless the authorization is terminated or revoked.  Performed at [facility], [place] [address], [place], [place] [address]   MRSA Next Gen by PCR, Nasal     Status: Abnormal   Collection Time: 12/27/20 12:14 AM   Specimen: Nasal Mucosa; Nasal Swab  Result Value Ref Range Status   MRSA by PCR Next Gen DETECTED (A) NOT DETECTED Final    Comment: RESULT CALLED TO, READ BACK BY AND VERIFIED WITH: RN TAYLOR P. 12/27/20@1 :26 BY TW (NOTE) The GeneXpert MRSA Assay (FDA approved for NASAL specimens only), is one component of a comprehensive MRSA colonization surveillance program. It is not intended to diagnose MRSA infection nor to guide or monitor treatment for MRSA infections. Test performance is not FDA approved in patients less than 53 years old. Performed at [facility], [place] [address], [place], [place] [address]   Culture, Respiratory w Gram Stain     Status: None   Collection Time: 12/30/20 10:20 AM   Specimen: Tracheal Aspirate; Respiratory  Result Value Ref Range Status   Specimen Description TRACHEAL ASPIRATE  Final   Special Requests NONE  Final   Gram Stain   Final    FEW WBC PRESENT, PREDOMINANTLY PMN RARE GRAM POSITIVE COCCI IN PAIRS IN CLUSTERS Performed at [facility], [address] [place],  [address]    Culture   Final    FEW METHICILLIN RESISTANT STAPHYLOCOCCUS AUREUS FEW STREPTOCOCCUS PNEUMONIAE    Report Status 01/03/2021 FINAL  Final   Organism ID, Bacteria METHICILLIN RESISTANT STAPHYLOCOCCUS AUREUS  Final   Organism ID, Bacteria STREPTOCOCCUS PNEUMONIAE  Final      Susceptibility   Methicillin resistant staphylococcus aureus - MIC*    CIPROFLOXACIN <=0.5 SENSITIVE Sensitive     ERYTHROMYCIN >=8 RESISTANT Resistant     GENTAMICIN <=0.5 SENSITIVE Sensitive     OXACILLIN >=4 RESISTANT Resistant     TETRACYCLINE <=1 SENSITIVE Sensitive     VANCOMYCIN 1 SENSITIVE Sensitive     TRIMETH/SULFA <=10 SENSITIVE Sensitive     CLINDAMYCIN <=0.25 SENSITIVE Sensitive     RIFAMPIN <=0.5 SENSITIVE Sensitive      Inducible Clindamycin NEGATIVE Sensitive     * FEW METHICILLIN RESISTANT STAPHYLOCOCCUS AUREUS   Streptococcus pneumoniae - MIC*    ERYTHROMYCIN <=0.12 SENSITIVE Sensitive     LEVOFLOXACIN 1 SENSITIVE Sensitive     VANCOMYCIN 0.5 SENSITIVE Sensitive     PENO - penicillin <=0.06      PENICILLIN (non-meningitis) <=0.06 SENSITIVE Sensitive     PENICILLIN (oral) <=0.06 SENSITIVE Sensitive     CEFTRIAXONE (non-meningitis) <=0.12 SENSITIVE Sensitive     * FEW STREPTOCOCCUS PNEUMONIAE    Anti-infectives:  Anti-infectives (From admission, onward)    Start     Dose/Rate Route Frequency Ordered Stop   01/04/21 0000  vancomycin (VANCOREADY) IVPB 1500 mg/300 mL  Status:  Discontinued        1,500 mg 150 mL/hr over 120 Minutes Intravenous Every 12 hours 01/03/21 1149 01/03/21 1153   01/04/21 0000  vancomycin (VANCOREADY) IVPB 1250 mg/250 mL        1,250 mg 166.7 mL/hr over 90 Minutes Intravenous Every 12 hours 01/03/21 1153     01/03/21 0000  vancomycin (VANCOREADY) IVPB 1750 mg/350 mL  Status:  Discontinued        1,750 mg 175 mL/hr over 120 Minutes Intravenous Every 12 hours 01/02/21 1347 01/03/21 1149   01/02/21 1045  vancomycin (VANCOCIN) 2,500 mg in sodium chloride 0.9 %  500 mL IVPB        2,500 mg 250 mL/hr over 120 Minutes Intravenous  Once 01/02/21 0959 01/02/21 1430   12/26/20 1945  ceFAZolin (ANCEF) IVPB 2g/100 mL premix        2 g 200 mL/hr over 30 Minutes Intravenous  Once 12/26/20 1932 12/26/20 2044       Best Practice/Protocols:  VTE Prophylaxis: Heparin (SQ) Continous Sedation  Consults: Treatment Team:  Karsten Ro, DO    Studies:    Events:  Subjective:    Overnight Issues:   Objective:  Vital signs for last 24 hours: Temp:  [98 F (36.7 C)-100 F (37.8 C)] 100 F (37.8 C) (10/17 0400) Pulse Rate:  [67-88] 87 (10/17 0800) Resp:  [16-29] 27 (10/17 0800) BP: (131-175)/(55-80) 134/58 (10/17 0545) SpO2:  [92 %-99 %] 97 % (10/17 0800) FiO2 (%):  [40  %] 40 % (10/17 0800) Weight:  [125.4 kg] 125.4 kg (10/17 0500)  Hemodynamic parameters for last 24 hours:    Intake/Output from previous day: 10/16 0701 - 10/17 0700 In: 2469.1 [I.V.:473.8; NG/GT:1495; IV Piggyback:500.3] Out: 1975 [Urine:1750; Stool:225]  Intake/Output this shift: No intake/output data recorded.  Vent settings for last 24 hours: Vent Mode: PSV;CPAP FiO2 (%):  [40 %] 40 % Set Rate:  [20 bmp] 20 bmp Vt Set:  [409 mL] 620 mL PEEP:  [5 cmH20] 5 cmH20 Pressure Support:  [10 BDZ32-99 cmH20] 12 cmH20 Plateau Pressure:  [25 cmH20] 25 cmH20  Physical Exam:  General: on vent Neuro: opens eyes and F/C  HEENT/Neck: ETT Resp: few rhonchi CVS: RRR GI: soft, NT, ND Extremities: edema 2+  Results for orders placed or performed during the hospital encounter of 12/26/20 (from the past 24 hour(s))  Glucose, capillary     Status: Abnormal   Collection Time: 01/04/21 11:37 AM  Result Value Ref Range   Glucose-Capillary 245 (H) 70 - 99 mg/dL  Glucose, capillary     Status: Abnormal   Collection Time: 01/04/21  3:43 PM  Result Value Ref Range   Glucose-Capillary 212 (H) 70 - 99 mg/dL  Glucose, capillary     Status: Abnormal   Collection Time: 01/04/21  7:52 PM  Result Value Ref Range   Glucose-Capillary 219 (H) 70 - 99 mg/dL  Glucose, capillary     Status: Abnormal   Collection Time: 01/04/21 11:26 PM  Result Value Ref Range   Glucose-Capillary 231 (H) 70 - 99 mg/dL  Glucose, capillary     Status: Abnormal   Collection Time: 01/05/21  3:25 AM  Result Value Ref Range   Glucose-Capillary 218 (H) 70 - 99 mg/dL  Glucose, capillary     Status: Abnormal   Collection Time: 01/05/21  7:34 AM  Result Value Ref Range   Glucose-Capillary 242 (H) 70 - 99 mg/dL    Assessment & Plan: Present on Admission:  TBI (traumatic brain injury)    LOS: 10 days   Additional comments:I reviewed the patient's new clinical lab test results. . M[facility]  TBI/B SAH and SDH/ICC/L IVH near  vertex/hemorrhage along splenium - Dr. Reatha Armour; repeat head ct grossly stable, keppra x7d for sz ppx, CTA neck neg, MR brain per Dr. 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 Repeat head CT shows mild increase in size of hygroma, exam improving Acute hypoxic ventilator dependent respiratory failure - trial of extubation now Scalp hematoma Multiple facial abrasions DM - A1c 8; SSI. DM coordinator eval today (?glargine) HX colon  cancer HX HTN - on lisinopril at home HX R MCA CVA Substance abuse (opiates, cocaine, alcohol) - was on CIWA for alcohol withdrawal, now Dex and Klon ID - Vanc for MRSA pneumonia On Brilinta, h/o MCS stent and stroke - hold C-collar - remove FEN - hold TF for extubation. Give lasix 40mg  x1 dose today. VTE prophylaxis - SCDs, SQH started 10/9 per [place] - ICU I spoke with his daughter and GF at the bedside. Critical Care Total Time*: 87 Minutes  Georganna Skeans, MD, MPH, FACS Trauma & General Surgery Use [email or web address] to contact on call provider  01/05/2021  *Care during the described time interval was provided by me. I have reviewed this patient's available data, including medical history, events of note, physical examination and test results as part of my evaluation.

## 2021-01-05 NOTE — Progress Notes (Addendum)
Inpatient Diabetes Program Recommendations  AACE/ADA: New Consensus Statement on Inpatient Glycemic Control (2015)  Target Ranges:  Prepandial:   less than 140 mg/dL      Peak postprandial:   less than 180 mg/dL (1-2 hours)      Critically ill patients:  140 - 180 mg/dL   Lab Results  Component Value Date   GLUCAP 242 (H) 01/05/2021   HGBA1C 8.0 (H) 12/26/2020    Review of Glycemic Control Results for Scott Day, Scott Day (MRN [id number]) as of 01/05/2021 10:14  Ref. Range 01/04/2021 19:52 01/04/2021 23:26 01/05/2021 03:25 01/05/2021 07:34  Glucose-Capillary Latest Ref Range: 70 - 99 mg/dL 219 (H) 231 (H) 218 (H) 242 (H)   Diabetes history: Type 2 DM Outpatient Diabetes medications: Metformin 250 mg BID Current orders for Inpatient glycemic control: Novolog 5 units Q4H, Novolog 0-15 units Q4H Pivot @65  ml/ hr  Inpatient Diabetes Program Recommendations:    Consider further increasing Novolog to 8 units Q4H for tube feed coverage (to be stopped or held in the event tube feeds stopped). Secure chat sent to MD.  Addendum @ 1430: Will wait to place order given patient successfully extubated and tube feeds on hold.  Thanks, Bronson Curb, MSN, RNC-OB Diabetes Coordinator [phone] (8a-5p)

## 2021-01-06 ENCOUNTER — Inpatient Hospital Stay (HOSPITAL_COMMUNITY): Payer: Medicaid Other

## 2021-01-06 DIAGNOSIS — I480 Paroxysmal atrial fibrillation: Secondary | ICD-10-CM

## 2021-01-06 DIAGNOSIS — I609 Nontraumatic subarachnoid hemorrhage, unspecified: Secondary | ICD-10-CM

## 2021-01-06 LAB — CBC
HCT: 28.8 % — ABNORMAL LOW (ref 39.0–52.0)
Hemoglobin: 9.1 g/dL — ABNORMAL LOW (ref 13.0–17.0)
MCH: 31.1 pg (ref 26.0–34.0)
MCHC: 31.6 g/dL (ref 30.0–36.0)
MCV: 98.3 fL (ref 80.0–100.0)
Platelets: 364 10*3/uL (ref 150–400)
RBC: 2.93 MIL/uL — ABNORMAL LOW (ref 4.22–5.81)
RDW: 12.3 % (ref 11.5–15.5)
WBC: 14.8 10*3/uL — ABNORMAL HIGH (ref 4.0–10.5)
nRBC: 0 % (ref 0.0–0.2)

## 2021-01-06 LAB — BASIC METABOLIC PANEL
Anion gap: 9 (ref 5–15)
BUN: 16 mg/dL (ref 6–20)
CO2: 29 mmol/L (ref 22–32)
Calcium: 8.8 mg/dL — ABNORMAL LOW (ref 8.9–10.3)
Chloride: 105 mmol/L (ref 98–111)
Creatinine, Ser: 0.66 mg/dL (ref 0.61–1.24)
GFR, Estimated: >60 mL/min (ref >60)
Glucose, Bld: 185 mg/dL — ABNORMAL HIGH (ref 70–99)
Potassium: 3.6 mmol/L (ref 3.5–5.1)
Sodium: 143 mmol/L (ref 135–145)

## 2021-01-06 LAB — GLUCOSE, CAPILLARY
Glucose-Capillary: 163 mg/dL — ABNORMAL HIGH (ref 70–99)
Glucose-Capillary: 144 mg/dL — ABNORMAL HIGH (ref 70–99)
Glucose-Capillary: 185 mg/dL — ABNORMAL HIGH (ref 70–99)
Glucose-Capillary: 158 mg/dL — ABNORMAL HIGH (ref 70–99)
Glucose-Capillary: 129 mg/dL — ABNORMAL HIGH (ref 70–99)
Glucose-Capillary: 139 mg/dL — ABNORMAL HIGH (ref 70–99)

## 2021-01-06 LAB — VANCOMYCIN, TROUGH: Vancomycin Tr: 7 ug/mL — ABNORMAL LOW (ref 15–20)

## 2021-01-06 MED ORDER — METOPROLOL TARTRATE 5 MG/5ML IV SOLN
5.0000 mg | Freq: Once | INTRAVENOUS | Status: AC
  Administered 2021-01-06: 5 mg via INTRAVENOUS

## 2021-01-06 MED ORDER — AMIODARONE HCL IN DEXTROSE 360-4.14 MG/200ML-% IV SOLN: 30.0000 mg/h | INTRAVENOUS | Status: DC

## 2021-01-06 MED ORDER — DILTIAZEM LOAD VIA INFUSION
10.0000 mg | Freq: Once | INTRAVENOUS | Status: DC
  Filled 2021-01-06: qty 10

## 2021-01-06 MED ORDER — METOPROLOL TARTRATE 5 MG/5ML IV SOLN
INTRAVENOUS | Status: AC
  Filled 2021-01-06: qty 5

## 2021-01-06 MED ORDER — POTASSIUM CHLORIDE 20 MEQ PO PACK
40.0000 meq | PACK | Freq: Once | ORAL | Status: AC
  Administered 2021-01-06: 40 meq
  Filled 2021-01-06: qty 2

## 2021-01-06 MED ORDER — AMIODARONE LOAD VIA INFUSION: 150.0000 mg | Freq: Once | INTRAVENOUS | Status: DC

## 2021-01-06 MED ORDER — VANCOMYCIN HCL 1250 MG/250ML IV SOLN
1250.0000 mg | Freq: Three times a day (TID) | INTRAVENOUS | Status: DC
  Administered 2021-01-06 – 2021-01-07 (×5): 1250 mg via INTRAVENOUS
  Filled 2021-01-06 (×5): qty 250

## 2021-01-06 MED ORDER — AMIODARONE HCL IN DEXTROSE 360-4.14 MG/200ML-% IV SOLN: 60.0000 mg/h | INTRAVENOUS | Status: DC

## 2021-01-06 MED ORDER — FUROSEMIDE 10 MG/ML IJ SOLN
40.0000 mg | Freq: Once | INTRAMUSCULAR | Status: AC
  Administered 2021-01-06: 40 mg via INTRAVENOUS
  Filled 2021-01-06: qty 4
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
## 2021-01-06 NOTE — Progress Notes (Signed)
SLP Cancellation Note  Patient Details Name: Aydan Levitz MRN: [id number] DOB: 30-May-1960   Cancelled treatment:       Reason Eval/Treat Not Completed: Medical issues which prohibited therapy (On Bipap.). Per RN, patient unable to come off Bipap, TF are on hold due to possible re-intubation and patient status overall tenuous. Will f/u  next date.   Gabriel Rainwater MA, CCC-SLP  Nyle Limb Meryl 01/06/2021, 9:47 AM

## 2021-01-06 NOTE — Progress Notes (Signed)
OT Cancellation Note  Patient Details Name: Scott Day MRN: [id number] DOB: September 08, 1960   Cancelled Treatment:    Reason Eval/Treat Not Completed: Patient not medically ready on bipap recent extubation and tenuous. Rn requesting to hold at this time.  Billey Chang, OTR/L  Acute Rehabilitation Services Pager: [phone] Office: [phone] .  01/06/2021, 10:21 AM

## 2021-01-06 NOTE — Progress Notes (Signed)
Patient ID: Scott Day, male   DOB: 1960-08-02, 60 y.o.   MRN: [id number] A. fib with RVR.  Lopressor 5 mg x 1 and did not convert.  BP okay.  Increase Precedex.  I have consulted cardiology.  Diltiazem bolus and drip.  I discussed with his daughter.  This may be a combination of response to his critical illness and pneumonia versus some delayed alcohol withdrawal.  Georganna Skeans, MD, MPH, FACS Please use [email or web address] to contact on call provider
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
## 2021-01-06 NOTE — Progress Notes (Signed)
Patient noted to have increased HR 150-160s, appears to be afib. EKG ordered and Dr Grandville Silos notified. Patient is restinjg comfortably in bed on bipap with precedex infusing and no apparent distress.SBp 150s.

## 2021-01-06 NOTE — Progress Notes (Signed)
Patient ID: Scott Day, male   DOB: 1960/11/24, 60 y.o.   MRN: [id number] Follow up - Trauma Critical Care  Patient Details:    Scott Day is an 60 y.o. male.  Lines/tubes : Flatus Tube/Pouch (Active)  Daily care Skin around tube assessed 01/05/21 2000  Output (mL) 75 mL 01/06/21 0600     External Urinary Catheter (Active)  Collection Container Dedicated Suction Canister 01/05/21 2000  Suction (Verified suction is between 40-80 mmHg) Yes 01/05/21 2000  Securement Method Tape 01/05/21 2000  Site Assessment Clean;Intact 01/05/21 2000  Intervention Male External Urinary Catheter Replaced 01/05/21 2000  Output (mL) 225 mL 01/06/21 0300    Microbiology/Sepsis markers: Results for orders placed or performed during the hospital encounter of 12/26/20  Resp Panel by RT-PCR (Flu A&B, Covid) Nasopharyngeal Swab     Status: None   Collection Time: 12/26/20  6:55 PM   Specimen: Nasopharyngeal Swab; Nasopharyngeal(NP) swabs in vial transport medium  Result Value Ref Range Status   SARS Coronavirus 2 by RT PCR NEGATIVE NEGATIVE Final    Comment: (NOTE) SARS-CoV-2 target nucleic acids are NOT DETECTED.  The SARS-CoV-2 RNA is generally detectable in upper respiratory specimens during the acute phase of infection. The lowest concentration of SARS-CoV-2 viral copies this assay can detect is 138 copies/mL. A negative result does not preclude SARS-Cov-2 infection and should not be used as the sole basis for treatment or other patient management decisions. A negative result may occur with  improper specimen collection/handling, submission of specimen other than nasopharyngeal swab, presence of viral mutation(s) within the areas targeted by this assay, and inadequate number of viral copies(<138 copies/mL). A negative result must be combined with clinical observations, patient history, and epidemiological information. The expected result is Negative.  Fact Sheet for Patients:   EntrepreneurPulse.com.au  Fact Sheet for Healthcare Providers:  IncredibleEmployment.be  This test is no t yet approved or cleared by the [place] FDA and  has been authorized for detection and/or diagnosis of SARS-CoV-2 by FDA under an Emergency Use Authorization (EUA). This EUA will remain  in effect (meaning this test can be used) for the duration of the COVID-19 declaration under Section 564(b)(1) of the Act, 21 U.S.C.section 360bbb-3(b)(1), unless the authorization is terminated  or revoked sooner.       Influenza A by PCR NEGATIVE NEGATIVE Final   Influenza B by PCR NEGATIVE NEGATIVE Final    Comment: (NOTE) The Xpert Xpress SARS-CoV-2/FLU/RSV plus assay is intended as an aid in the diagnosis of influenza from Nasopharyngeal swab specimens and should not be used as a sole basis for treatment. Nasal washings and aspirates are unacceptable for Xpert Xpress SARS-CoV-2/FLU/RSV testing.  Fact Sheet for Patients: EntrepreneurPulse.com.au  Fact Sheet for Healthcare Providers: IncredibleEmployment.be  This test is not yet approved or cleared by the [place] FDA and has been authorized for detection and/or diagnosis of SARS-CoV-2 by FDA under an Emergency Use Authorization (EUA). This EUA will remain in effect (meaning this test can be used) for the duration of the COVID-19 declaration under Section 564(b)(1) of the Act, 21 U.S.C. section 360bbb-3(b)(1), unless the authorization is terminated or revoked.  Performed at [facility], [place] [address], [place], [place] [address]   MRSA Next Gen by PCR, Nasal     Status: Abnormal   Collection Time: 12/27/20 12:14 AM   Specimen: Nasal Mucosa; Nasal Swab  Result Value Ref Range Status   MRSA by PCR Next Gen DETECTED (A) NOT DETECTED  Final    Comment: RESULT CALLED TO, READ BACK BY AND VERIFIED WITH: RN TAYLOR P. 12/27/20@1 :26 BY TW (NOTE) The  GeneXpert MRSA Assay (FDA approved for NASAL specimens only), is one component of a comprehensive MRSA colonization surveillance program. It is not intended to diagnose MRSA infection nor to guide or monitor treatment for MRSA infections. Test performance is not FDA approved in patients less than 63 years old. Performed at [facility], [place] [address] [place], [place] [address]   Culture, Respiratory w Gram Stain     Status: None   Collection Time: 12/30/20 10:20 AM   Specimen: Tracheal Aspirate; Respiratory  Result Value Ref Range Status   Specimen Description TRACHEAL ASPIRATE  Final   Special Requests NONE  Final   Gram Stain   Final    FEW WBC PRESENT, PREDOMINANTLY PMN RARE GRAM POSITIVE COCCI IN PAIRS IN CLUSTERS Performed at [facility], [address] [place], [place] [address]    Culture   Final    FEW METHICILLIN RESISTANT STAPHYLOCOCCUS AUREUS FEW STREPTOCOCCUS PNEUMONIAE    Report Status 01/03/2021 FINAL  Final   Organism ID, Bacteria METHICILLIN RESISTANT STAPHYLOCOCCUS AUREUS  Final   Organism ID, Bacteria STREPTOCOCCUS PNEUMONIAE  Final      Susceptibility   Methicillin resistant staphylococcus aureus - MIC*    CIPROFLOXACIN <=0.5 SENSITIVE Sensitive     ERYTHROMYCIN >=8 RESISTANT Resistant     GENTAMICIN <=0.5 SENSITIVE Sensitive     OXACILLIN >=4 RESISTANT Resistant     TETRACYCLINE <=1 SENSITIVE Sensitive     VANCOMYCIN 1 SENSITIVE Sensitive     TRIMETH/SULFA <=10 SENSITIVE Sensitive     CLINDAMYCIN <=0.25 SENSITIVE Sensitive     RIFAMPIN <=0.5 SENSITIVE Sensitive     Inducible Clindamycin NEGATIVE Sensitive     * FEW METHICILLIN RESISTANT STAPHYLOCOCCUS AUREUS   Streptococcus pneumoniae - MIC*    ERYTHROMYCIN <=0.12 SENSITIVE Sensitive     LEVOFLOXACIN 1 SENSITIVE Sensitive     VANCOMYCIN 0.5 SENSITIVE Sensitive     PENO - penicillin <=0.06      PENICILLIN (non-meningitis) <=0.06 SENSITIVE Sensitive     PENICILLIN (oral) <=0.06 SENSITIVE  Sensitive     CEFTRIAXONE (non-meningitis) <=0.12 SENSITIVE Sensitive     * FEW STREPTOCOCCUS PNEUMONIAE    Anti-infectives:  Anti-infectives (From admission, onward)    Start     Dose/Rate Route Frequency Ordered Stop   01/06/21 0800  vancomycin (VANCOREADY) IVPB 1250 mg/250 mL        1,250 mg 166.7 mL/hr over 90 Minutes Intravenous Every 8 hours 01/06/21 0129     01/04/21 0000  vancomycin (VANCOREADY) IVPB 1500 mg/300 mL  Status:  Discontinued        1,500 mg 150 mL/hr over 120 Minutes Intravenous Every 12 hours 01/03/21 1149 01/03/21 1153   01/04/21 0000  vancomycin (VANCOREADY) IVPB 1250 mg/250 mL  Status:  Discontinued        1,250 mg 166.7 mL/hr over 90 Minutes Intravenous Every 12 hours 01/03/21 1153 01/06/21 0128   01/03/21 0000  vancomycin (VANCOREADY) IVPB 1750 mg/350 mL  Status:  Discontinued        1,750 mg 175 mL/hr over 120 Minutes Intravenous Every 12 hours 01/02/21 1347 01/03/21 1149   01/02/21 1045  vancomycin (VANCOCIN) 2,500 mg in sodium chloride 0.9 % 500 mL IVPB        2,500 mg 250 mL/hr over 120 Minutes Intravenous  Once 01/02/21 0959 01/02/21 1430   12/26/20 1945  ceFAZolin (ANCEF) IVPB 2g/100 mL premix        2 g 200 mL/hr over 30 Minutes Intravenous  Once 12/26/20 1932 12/26/20 2044       Best Practice/Protocols:  VTE Prophylaxis: Heparin (SQ) Intermittent Sedation  Consults: Treatment Team:  Dawley, Theodoro Doing, DO    Studies:    Events:  Subjective:    Overnight Issues:   Objective:  Vital signs for last 24 hours: Temp:  [97.7 F (36.5 C)-98.4 F (36.9 C)] 97.7 F (36.5 C) (10/18 0000) Pulse Rate:  [72-116] 103 (10/18 0700) Resp:  [17-50] 34 (10/18 0700) BP: (128-207)/(49-144) 161/75 (10/18 0700) SpO2:  [91 %-100 %] 97 % (10/18 0700) Weight:  [125 kg] 125 kg (10/18 0452)  Hemodynamic parameters for last 24 hours:    Intake/Output from previous day: 10/17 0701 - 10/18 0700 In: 646.3 [I.V.:26.3; NG/GT:120; IV Piggyback:500] Out:  2300 [Urine:2000; Stool:300]  Intake/Output this shift: No intake/output data recorded.  Vent settings for last 24 hours:    Physical Exam:  General: on BiPAP Neuro: alert and speaking more, F/C HEENT/Neck: BiPAP Resp: rhonchi bilaterally CVS: regular rate and rhythm, S1, S2 normal, no murmur, click, rub or gallop GI: Soft, NT Extremities: edema 2+  Results for orders placed or performed during the hospital encounter of 12/26/20 (from the past 24 hour(s))  Glucose, capillary     Status: Abnormal   Collection Time: 01/05/21 11:27 AM  Result Value Ref Range   Glucose-Capillary 176 (H) 70 - 99 mg/dL  Vancomycin, peak     Status: Abnormal   Collection Time: 01/05/21  2:12 PM  Result Value Ref Range   Vancomycin Pk 23 (L) 30 - 40 ug/mL  Glucose, capillary     Status: Abnormal   Collection Time: 01/05/21  3:21 PM  Result Value Ref Range   Glucose-Capillary 150 (H) 70 - 99 mg/dL  Glucose, capillary     Status: Abnormal   Collection Time: 01/05/21  7:43 PM  Result Value Ref Range   Glucose-Capillary 130 (H) 70 - 99 mg/dL  I-STAT 7, (LYTES, BLD GAS, ICA, H+H)     Status: Abnormal   Collection Time: 01/05/21  8:40 PM  Result Value Ref Range   pH, Arterial 7.415 7.350 - 7.450   pCO2 arterial 50.6 (H) 32.0 - 48.0 mmHg   pO2, Arterial 110 (H) 83.0 - 108.0 mmHg   Bicarbonate 32.5 (H) 20.0 - 28.0 mmol/L   TCO2 34 (H) 22 - 32 mmol/L   O2 Saturation 98.0 %   Acid-Base Excess 7.0 (H) 0.0 - 2.0 mmol/L   Sodium 142 135 - 145 mmol/L   Potassium 3.6 3.5 - 5.1 mmol/L   Calcium, Ion 1.23 1.15 - 1.40 mmol/L   HCT 29.0 (L) 39.0 - 52.0 %   Hemoglobin 9.9 (L) 13.0 - 17.0 g/dL   Patient temperature 36.7 C    Collection site Radial    Drawn by RT    Sample type ARTERIAL   Glucose, capillary     Status: Abnormal   Collection Time: 01/05/21 11:20 PM  Result Value Ref Range   Glucose-Capillary 190 (H) 70 - 99 mg/dL  Vancomycin, trough     Status: Abnormal   Collection Time: 01/05/21 11:50 PM   Result Value Ref Range   Vancomycin Tr 7 (L) 15 - 20 ug/mL  Glucose, capillary     Status: Abnormal   Collection Time: 01/06/21  3:15 AM  Result Value Ref Range   Glucose-Capillary 163 (H) 70 -  99 mg/dL  CBC     Status: Abnormal   Collection Time: 01/06/21  4:04 AM  Result Value Ref Range   WBC 14.8 (H) 4.0 - 10.5 K/uL   RBC 2.93 (L) 4.22 - 5.81 MIL/uL   Hemoglobin 9.1 (L) 13.0 - 17.0 g/dL   HCT 28.8 (L) 39.0 - 52.0 %   MCV 98.3 80.0 - 100.0 fL   MCH 31.1 26.0 - 34.0 pg   MCHC 31.6 30.0 - 36.0 g/dL   RDW 12.3 11.5 - 15.5 %   Platelets 364 150 - 400 K/uL   nRBC 0.0 0.0 - 0.2 %  Basic metabolic panel     Status: Abnormal   Collection Time: 01/06/21  4:04 AM  Result Value Ref Range   Sodium 143 135 - 145 mmol/L   Potassium 3.6 3.5 - 5.1 mmol/L   Chloride 105 98 - 111 mmol/L   CO2 29 22 - 32 mmol/L   Glucose, Bld 185 (H) 70 - 99 mg/dL   BUN 16 6 - 20 mg/dL   Creatinine, Ser 0.66 0.61 - 1.24 mg/dL   Calcium 8.8 (L) 8.9 - 10.3 mg/dL   GFR, Estimated >60 >60 mL/min   Anion gap 9 5 - 15  Glucose, capillary     Status: Abnormal   Collection Time: 01/06/21  7:50 AM  Result Value Ref Range   Glucose-Capillary 144 (H) 70 - 99 mg/dL    Assessment & Plan: Present on Admission:  TBI (traumatic brain injury)    LOS: 11 days   Additional comments:I reviewed the patient's new clinical lab test results. . CXR and ABG MCC  TBI/B SAH and SDH/ICC/L IVH near vertex/hemorrhage along splenium - Dr. Reatha Armour; repeat head ct grossly stable, keppra x7d for sz ppx, CTA neck neg, MR brain per Dr. 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 Repeat head CT shows mild increase in size of hygroma, exam improving Acute hypoxic ventilator dependent respiratory failure - extubated 10/17, a lot of secretions but coughing better, BiPAP and close monitoring, lasix again Scalp hematoma Multiple facial abrasions DM - A1c 8; SSI. Appreciate DM coordinator eval. HX  colon cancer HX HTN - on lisinopril at home HX R MCA CVA Substance abuse (opiates, cocaine, alcohol) - was on CIWA for alcohol withdrawal, now Dex and Klon ID - Vanc for MRSA pneumonia On Brilinta, h/o MCS stent and stroke - hold C-collar - remove FEN - hold TF for resp failure, lasix x 1, replete mild hypokalemia VTE prophylaxis - SCDs, SQH started 10/9 per [place] - ICU I spoke with his GF at the bedside. Critical Care Total Time*: 41 Minutes  Georganna Skeans, MD, MPH, FACS Trauma & General Surgery Use [email or web address] to contact on call provider  01/06/2021  *Care during the described time interval was provided by me. I have reviewed this patient's available data, including medical history, events of note, physical examination and test results as part of my evaluation.

## 2021-01-06 NOTE — Progress Notes (Signed)
Subjective: Patient reports on BiPap with moderately increased WOB. Daughter at bedside reports the patient is more active today. RN reporting episodes of hypertension overnight that responded well to PRN antihypertensives and NTS.     Objective: Vital signs in last 24 hours: Temp:  [97.7 F (36.5 C)-98.4 F (36.9 C)] 97.7 F (36.5 C) (10/18 0000) Pulse Rate:  [72-116] 103 (10/18 0700) Resp:  [17-50] 34 (10/18 0700) BP: (128-207)/(49-144) 161/75 (10/18 0700) SpO2:  [91 %-100 %] 99 % (10/18 0800) FiO2 (%):  [40 %] 40 % (10/18 0800) Weight:  [196 kg] 125 kg (10/18 0452)  Intake/Output from previous day: 10/17 0701 - 10/18 0700 In: 646.3 [I.V.:26.3; NG/GT:120; IV Piggyback:500] Out: 2300 [Urine:2000; Stool:300] Intake/Output this shift: No intake/output data recorded.   Physical Exam: Patient is awake, moderately anxious, and on BiPap. He has spontaneous eye opening. He follow commands and has spontaneous movement in his BLE and RUE. PERLA   Lab Results: Recent Labs    01/04/21 0256 01/05/21 2040 01/06/21 0404  WBC 8.4  --  14.8*  HGB 8.6* 9.9* 9.1*  HCT 26.2* 29.0* 28.8*  PLT 244  --  364   BMET Recent Labs    01/04/21 0256 01/05/21 2040 01/06/21 0404  NA 141 142 143  K 3.8 3.6 3.6  CL 108  --  105  CO2 27  --  29  GLUCOSE 198*  --  185*  BUN 14  --  16  CREATININE 0.67  --  0.66  CALCIUM 8.1*  --  8.8*    Studies/Results: DG CHEST PORT 1 VIEW  Result Date: 01/06/2021 CLINICAL DATA:  Respiratory failure EXAM: PORTABLE CHEST 1 VIEW COMPARISON:  Yesterday FINDINGS: Loop recorder over the left chest. Feeding tube extends beyond the inferior aspect of the film. Numerous leads and wires project over the chest. Midline trachea. Mild cardiomegaly. No pneumothorax. No definite pleural fluid. Right greater than left, relatively diffuse interstitial and airspace disease is similar. IMPRESSION: No significant change in right greater than left diffuse interstitial and  airspace disease. This could represent asymmetric pulmonary edema or multifocal infection/aspiration. Electronically Signed   By: Abigail Miyamoto M.D.   On: 01/06/2021 08:20   DG CHEST PORT 1 VIEW  Result Date: 01/05/2021 CLINICAL DATA:  Shortness of breath EXAM: PORTABLE CHEST 1 VIEW COMPARISON:  01/03/2021 FINDINGS: Endotracheal tube has been removed. Esophageal tube tip below the diaphragm. Electronic recording device over the left chest. Stable cardiomediastinal silhouette. Right greater than left perihilar edema or infiltrates slightly worse on the right side in the interim. Patchy airspace disease at left base. No pneumothorax. IMPRESSION: Slight interval worsening of right perihilar airspace disease compared to most recent prior. Otherwise no significant change Electronically Signed   By: Donavan Foil M.D.   On: 01/05/2021 20:43    Assessment/Plan: 60 y.o. male with TBI with DAI and right hygroma. The patient was extubated yesterday and is currently on BiPap. He has moderately increased WOB with thick secretions. His neurological examination is stable to slightly improved. Hypertension overnight resolved with PRN meds and NTS. Continue frequent neuro checks. Continue supportive care. No new neurosurgical recommendations at this time. Call with any questions.   LOS: 11 days     Marvis Moeller, DNP, NP-C 01/06/2021, 8:23 AM

## 2021-01-06 NOTE — Progress Notes (Signed)
PT Cancellation Note  Patient Details Name: Scott Day MRN: [id number] DOB: 1960-04-02   Cancelled Treatment:    Reason Eval/Treat Not Completed: Patient not medically ready; on Bipap and precedex.  Will attempt another day.   Reginia Naas 01/06/2021, 10:21 AM Magda Kiel, PT Acute Rehabilitation Services RMBOB:[phone] Office:[phone] 01/06/2021

## 2021-01-06 NOTE — Progress Notes (Signed)
Patient ID: Scott Day, male   DOB: 07/30/1960, 60 y.o.   MRN: [id number] I met with his daughter at the bedside for a clinical update.   , MD, MPH, FACS Please use [email or web address] to contact on call provider  

## 2021-01-06 NOTE — Progress Notes (Signed)
Carelink Summary Report / Loop Recorder 

## 2021-01-07 ENCOUNTER — Inpatient Hospital Stay: Payer: Self-pay

## 2021-01-07 ENCOUNTER — Inpatient Hospital Stay (HOSPITAL_COMMUNITY): Payer: Medicaid Other

## 2021-01-07 ENCOUNTER — Inpatient Hospital Stay (HOSPITAL_COMMUNITY): Payer: Medicaid Other | Admitting: Anesthesiology

## 2021-01-07 DIAGNOSIS — I480 Paroxysmal atrial fibrillation: Secondary | ICD-10-CM | POA: Diagnosis not present

## 2021-01-07 LAB — POCT I-STAT 7, (LYTES, BLD GAS, ICA,H+H)
Acid-Base Excess: 5 mmol/L — ABNORMAL HIGH (ref 0.0–2.0)
Acid-Base Excess: 8 mmol/L — ABNORMAL HIGH (ref 0.0–2.0)
Bicarbonate: 31.9 mmol/L — ABNORMAL HIGH (ref 20.0–28.0)
Bicarbonate: 34.1 mmol/L — ABNORMAL HIGH (ref 20.0–28.0)
Calcium, Ion: 1.27 mmol/L (ref 1.15–1.40)
Calcium, Ion: 1.21 mmol/L (ref 1.15–1.40)
HCT: 29 % — ABNORMAL LOW (ref 39.0–52.0)
HCT: 27 % — ABNORMAL LOW (ref 39.0–52.0)
Hemoglobin: 9.9 g/dL — ABNORMAL LOW (ref 13.0–17.0)
Hemoglobin: 9.2 g/dL — ABNORMAL LOW (ref 13.0–17.0)
O2 Saturation: 98 %
O2 Saturation: 100 %
Patient temperature: 37
Patient temperature: 97.7
Potassium: 3.8 mmol/L (ref 3.5–5.1)
Potassium: 4.5 mmol/L (ref 3.5–5.1)
Sodium: 144 mmol/L (ref 135–145)
Sodium: 143 mmol/L (ref 135–145)
TCO2: 34 mmol/L — ABNORMAL HIGH (ref 22–32)
TCO2: 36 mmol/L — ABNORMAL HIGH (ref 22–32)
pCO2 arterial: 58 mmHg — ABNORMAL HIGH (ref 32.0–48.0)
pCO2 arterial: 53.4 mmHg — ABNORMAL HIGH (ref 32.0–48.0)
pH, Arterial: 7.349 — ABNORMAL LOW (ref 7.350–7.450)
pH, Arterial: 7.41 (ref 7.350–7.450)
pO2, Arterial: 116 mmHg — ABNORMAL HIGH (ref 83.0–108.0)
pO2, Arterial: 359 mmHg — ABNORMAL HIGH (ref 83.0–108.0)

## 2021-01-07 LAB — BASIC METABOLIC PANEL
Anion gap: 9 (ref 5–15)
BUN: 21 mg/dL — ABNORMAL HIGH (ref 6–20)
CO2: 30 mmol/L (ref 22–32)
Calcium: 8.6 mg/dL — ABNORMAL LOW (ref 8.9–10.3)
Chloride: 103 mmol/L (ref 98–111)
Creatinine, Ser: 0.79 mg/dL (ref 0.61–1.24)
GFR, Estimated: >60 mL/min (ref >60)
Glucose, Bld: 150 mg/dL — ABNORMAL HIGH (ref 70–99)
Potassium: 3.7 mmol/L (ref 3.5–5.1)
Sodium: 142 mmol/L (ref 135–145)

## 2021-01-07 LAB — CBC
HCT: 28.2 % — ABNORMAL LOW (ref 39.0–52.0)
Hemoglobin: 9 g/dL — ABNORMAL LOW (ref 13.0–17.0)
MCH: 31.7 pg (ref 26.0–34.0)
MCHC: 31.9 g/dL (ref 30.0–36.0)
MCV: 99.3 fL (ref 80.0–100.0)
Platelets: 352 10*3/uL (ref 150–400)
RBC: 2.84 MIL/uL — ABNORMAL LOW (ref 4.22–5.81)
RDW: 12.3 % (ref 11.5–15.5)
WBC: 11.9 10*3/uL — ABNORMAL HIGH (ref 4.0–10.5)
nRBC: 0 % (ref 0.0–0.2)

## 2021-01-07 LAB — GLUCOSE, CAPILLARY
Glucose-Capillary: 136 mg/dL — ABNORMAL HIGH (ref 70–99)
Glucose-Capillary: 165 mg/dL — ABNORMAL HIGH (ref 70–99)
Glucose-Capillary: 184 mg/dL — ABNORMAL HIGH (ref 70–99)
Glucose-Capillary: 187 mg/dL — ABNORMAL HIGH (ref 70–99)
Glucose-Capillary: 188 mg/dL — ABNORMAL HIGH (ref 70–99)
Glucose-Capillary: 214 mg/dL — ABNORMAL HIGH (ref 70–99)

## 2021-01-07 LAB — VANCOMYCIN, TROUGH
Vancomycin Tr: 22 ug/mL (ref 15–20)
Vancomycin Tr: 18 ug/mL (ref 15–20)

## 2021-01-07 MED ORDER — CLONAZEPAM 0.25 MG PO TBDP
0.5000 mg | ORAL_TABLET | Freq: Two times a day (BID) | ORAL | Status: DC
  Administered 2021-01-07 – 2021-01-10 (×6): 0.5 mg
  Filled 2021-01-07 (×6): qty 2

## 2021-01-07 MED ORDER — SODIUM CHLORIDE 0.9 % IV SOLN
250.0000 mL | INTRAVENOUS | Status: DC
  Administered 2021-01-08: 250 mL via INTRAVENOUS

## 2021-01-07 MED ORDER — PROPOFOL 10 MG/ML IV BOLUS
INTRAVENOUS | Status: DC | PRN
  Administered 2021-01-07: 120 mg via INTRAVENOUS

## 2021-01-07 MED ORDER — SODIUM CHLORIDE 0.9% FLUSH
10.0000 mL | Freq: Two times a day (BID) | INTRAVENOUS | Status: DC
  Administered 2021-01-07 – 2021-01-08 (×3): 30 mL
  Administered 2021-01-08 – 2021-01-09 (×2): 10 mL
  Administered 2021-01-11: 30 mL
  Administered 2021-01-12: 10 mL
  Administered 2021-01-12: 30 mL

## 2021-01-07 MED ORDER — METOPROLOL TARTRATE 5 MG/5ML IV SOLN
5.0000 mg | Freq: Four times a day (QID) | INTRAVENOUS | Status: DC | PRN
  Administered 2021-01-07: 5 mg via INTRAVENOUS
  Filled 2021-01-07 (×2): qty 5

## 2021-01-07 MED ORDER — METOPROLOL TARTRATE 5 MG/5ML IV SOLN
INTRAVENOUS | Status: AC
  Filled 2021-01-07: qty 5

## 2021-01-07 MED ORDER — FENTANYL CITRATE PF 50 MCG/ML IJ SOSY
50.0000 ug | PREFILLED_SYRINGE | Freq: Once | INTRAMUSCULAR | Status: AC
  Administered 2021-01-07: 50 ug via INTRAVENOUS

## 2021-01-07 MED ORDER — FUROSEMIDE 10 MG/ML IJ SOLN
40.0000 mg | Freq: Once | INTRAMUSCULAR | Status: DC
Start: 2021-01-07 — End: 2021-01-10
  Filled 2021-01-07: qty 4

## 2021-01-07 MED ORDER — VANCOMYCIN HCL IN DEXTROSE 1-5 GM/200ML-% IV SOLN
1000.0000 mg | Freq: Three times a day (TID) | INTRAVENOUS | Status: DC
  Administered 2021-01-08 – 2021-01-09 (×6): 1000 mg via INTRAVENOUS
  Filled 2021-01-07 (×6): qty 200

## 2021-01-07 MED ORDER — SUCCINYLCHOLINE CHLORIDE 200 MG/10ML IV SOSY
PREFILLED_SYRINGE | INTRAVENOUS | Status: DC | PRN
  Administered 2021-01-07: 140 mg via INTRAVENOUS

## 2021-01-07 MED ORDER — DILTIAZEM HCL-DEXTROSE 125-5 MG/125ML-% IV SOLN (PREMIX)
5.0000 mg/h | INTRAVENOUS | Status: DC
  Administered 2021-01-07: 5 mg/h via INTRAVENOUS
  Filled 2021-01-07: qty 125

## 2021-01-07 MED ORDER — PHENYLEPHRINE HCL-NACL 20-0.9 MG/250ML-% IV SOLN
25.0000 ug/min | INTRAVENOUS | Status: DC
  Administered 2021-01-07 – 2021-01-08 (×3): 25 ug/min via INTRAVENOUS
  Filled 2021-01-07 (×3): qty 250

## 2021-01-07 MED ORDER — POTASSIUM CHLORIDE 20 MEQ PO PACK
40.0000 meq | PACK | Freq: Once | ORAL | Status: AC
  Administered 2021-01-07: 40 meq
  Filled 2021-01-07: qty 2

## 2021-01-07 MED ORDER — LIDOCAINE HCL (CARDIAC) PF 100 MG/5ML IV SOSY
PREFILLED_SYRINGE | INTRAVENOUS | Status: DC | PRN
  Administered 2021-01-07: 60 mg via INTRAVENOUS

## 2021-01-07 MED ORDER — FENTANYL 2500MCG IN NS 250ML (10MCG/ML) PREMIX INFUSION
50.0000 ug/h | INTRAVENOUS | Status: DC
  Administered 2021-01-07 – 2021-01-09 (×2): 50 ug/h via INTRAVENOUS
  Filled 2021-01-07 (×2): qty 250

## 2021-01-07 MED ORDER — SODIUM CHLORIDE 0.9% FLUSH: 10.0000 mL | INTRAVENOUS | Status: DC | PRN

## 2021-01-07 MED ORDER — DILTIAZEM LOAD VIA INFUSION
10.0000 mg | Freq: Once | INTRAVENOUS | Status: AC
  Administered 2021-01-07: 10 mg via INTRAVENOUS
  Filled 2021-01-07: qty 10

## 2021-01-07 MED ORDER — METOPROLOL TARTRATE 5 MG/5ML IV SOLN
10.0000 mg | INTRAVENOUS | Status: DC | PRN
  Administered 2021-01-07 – 2021-01-15 (×2): 10 mg via INTRAVENOUS
  Filled 2021-01-07: qty 10

## 2021-01-07 MED ORDER — FENTANYL BOLUS VIA INFUSION
50.0000 ug | INTRAVENOUS | Status: DC | PRN
  Filled 2021-01-07: qty 100

## 2021-01-07 NOTE — Progress Notes (Addendum)
Progress Note  Patient Name: Scott Day Date of Encounter: 01/07/2021  [facility] Cardiologist: Larae Grooms, MD  EP: Dr. Caryl Comes   Subjective   Intubated.  Had recurrent afib this morning.  Currently in sinus rhythm.   Inpatient Medications    Scheduled Meds:  acetaminophen  1,000 mg Per Tube Q6H   chlorhexidine gluconate (MEDLINE KIT)  15 mL Mouth Rinse BID   Chlorhexidine Gluconate Cloth  6 each Topical Daily   clonazepam  0.5 mg Per Tube BID   docusate  100 mg Per Tube BID   fentaNYL (SUBLIMAZE) injection  50 mcg Intravenous Once   FLUoxetine  40 mg Per Tube Daily   folic acid  1 mg Per Tube Daily   guaiFENesin  10 mL Per Tube Q6H   heparin injection (subcutaneous)  5,000 Units Subcutaneous Q8H   insulin aspart  0-15 Units Subcutaneous Q4H   insulin aspart  5 Units Subcutaneous Q4H   ipratropium-albuterol  3 mL Nebulization Q6H   lisinopril  2.5 mg Per Tube Daily   mouth rinse  15 mL Mouth Rinse 10 times per day   methocarbamol  1,000 mg Per Tube Q8H   multivitamin with minerals  1 tablet Per Tube Daily   pantoprazole sodium  40 mg Per Tube QHS   polyethylene glycol  17 g Per Tube Daily   QUEtiapine  25 mg Per NG tube QHS   rosuvastatin  20 mg Per Tube QHS   thiamine  100 mg Per Tube Daily   valproic acid  250 mg Per Tube BID   Continuous Infusions:  dexmedetomidine (PRECEDEX) IV infusion 1.2 mcg/kg/hr (01/07/21 1000)   diltiazem (CARDIZEM) infusion 10 mg/hr (01/07/21 1000)   feeding supplement (PIVOT 1.5 CAL) 65 mL/hr at 01/04/21 1700   fentaNYL infusion INTRAVENOUS 50 mcg/hr (01/07/21 1015)   vancomycin Stopped (01/07/21 0915)   PRN Meds: albuterol, fentaNYL, hydrALAZINE, metoprolol tartrate, metoprolol tartrate, ondansetron **OR** ondansetron (ZOFRAN) IV, oxyCODONE, QUEtiapine   Vital Signs    Vitals:   01/07/21 0915 01/07/21 0930 01/07/21 0945 01/07/21 1000  BP: (!) 149/77 131/60 114/63 106/65  Pulse: 72 68 69 60  Resp: (!) 24 18  (!) 30 (!) 25  Temp:      TempSrc:      SpO2: 93% 99% 99% 93%  Weight:      Height:        Intake/Output Summary (Last 24 hours) at 01/07/2021 1016 Last data filed at 01/07/2021 1000 Gross per 24 hour  Intake 1401.4 ml  Output 1550 ml  Net -148.6 ml   Last 3 Weights 01/07/2021 01/06/2021 01/05/2021  Weight (lbs) 273 lb 5.9 oz 275 lb 9.2 oz 276 lb 7.3 oz  Weight (kg) 124 kg 125 kg 125.4 kg      Telemetry    Sinus rhythm, intermittent afib - Personally Reviewed  ECG    No new tracing   Physical Exam   GEN: Intubated and sedated Neck: No JVD Cardiac: RRR, no murmurs, rubs, or gallops.  Respiratory: Clear to auscultation bilaterally. GI: Soft, nontender, non-distended  MS: + edema; No deformity. Neuro:  Intubated Psych: Intubated   Labs    Chemistry Recent Labs  Lab 01/04/21 0256 01/05/21 2040 01/06/21 0404 01/06/21 2357 01/07/21 0532  NA 141   < > 143 142 144  K 3.8   < > 3.6 3.7 3.8  CL 108  --  105 103  --   CO2 27  --  29 30  --  GLUCOSE 198*  --  185* 150*  --   BUN 14  --  16 21*  --   CREATININE 0.67  --  0.66 0.79  --   CALCIUM 8.1*  --  8.8* 8.6*  --   GFRNONAA >60  --  >60 >60  --   ANIONGAP 6  --  9 9  --    < > = values in this interval not displayed.    Hematology Recent Labs  Lab 01/04/21 0256 01/05/21 2040 01/06/21 0404 01/06/21 2357 01/07/21 0532  WBC 8.4  --  14.8* 11.9*  --   RBC 2.69*  --  2.93* 2.84*  --   HGB 8.6*   < > 9.1* 9.0* 9.9*  HCT 26.2*   < > 28.8* 28.2* 29.0*  MCV 97.4  --  98.3 99.3  --   MCH 32.0  --  31.1 31.7  --   MCHC 32.8  --  31.6 31.9  --   RDW 12.3  --  12.3 12.3  --   PLT 244  --  364 352  --    < > = values in this interval not displayed.    Radiology    DG CHEST PORT 1 VIEW  Result Date: 01/06/2021 CLINICAL DATA:  Respiratory failure EXAM: PORTABLE CHEST 1 VIEW COMPARISON:  Yesterday FINDINGS: Loop recorder over the left chest. Feeding tube extends beyond the inferior aspect of the film.  Numerous leads and wires project over the chest. Midline trachea. Mild cardiomegaly. No pneumothorax. No definite pleural fluid. Right greater than left, relatively diffuse interstitial and airspace disease is similar. IMPRESSION: No significant change in right greater than left diffuse interstitial and airspace disease. This could represent asymmetric pulmonary edema or multifocal infection/aspiration. Electronically Signed   By: Abigail Miyamoto M.D.   On: 01/06/2021 08:20   DG CHEST PORT 1 VIEW  Result Date: 01/05/2021 CLINICAL DATA:  Shortness of breath EXAM: PORTABLE CHEST 1 VIEW COMPARISON:  01/03/2021 FINDINGS: Endotracheal tube has been removed. Esophageal tube tip below the diaphragm. Electronic recording device over the left chest. Stable cardiomediastinal silhouette. Right greater than left perihilar edema or infiltrates slightly worse on the right side in the interim. Patchy airspace disease at left base. No pneumothorax. IMPRESSION: Slight interval worsening of right perihilar airspace disease compared to most recent prior. Otherwise no significant change Electronically Signed   By: Donavan Foil M.D.   On: 01/05/2021 20:43    Cardiac Studies   N/A  Patient Profile     60 y.o. male with a hx of CVA in 05/2019 s/p ILR, anxiety, bipolar disorder, HTN, DM, OSA, alcohol abuse, and hx of colon cancer who is being seen 01/06/2021 for the evaluation of new onset Afib RVR.   He was brought in by EMS on 12/26/20 after [place] GCS was 5. He was intubated on arrival. Imaging revealed SAH and SDH with hemorrhage along splenium. He was placed on CIWA protocol. 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   Assessment & Plan    New onset afib RVR - Spontaneously converted to sinus yesterday however again had brief afib RVR this morning. Started on IV cardizem with load with conversion to sinus rhythm.  -Continue IV cardizem >> Convert to PO when able - CHADSVASCs  score of 4 (HTN, 2CVA, DM) but not an anticoagulation candidate currently  - Follow afib % on loop in outpatient setting and reassess anticoagulation at that point  -  Follow up in afib clinic or Dr. Caryl Comes after discharge  For questions or updates, please contact [facility] Please consult [email or web address] for contact info under        Signed, Jenkins Rouge, MD  01/07/2021, 10:16 AM

## 2021-01-07 NOTE — Progress Notes (Signed)
Patient ID: Scott Day, male   DOB: 1960/12/15, 60 y.o.   MRN: [id number] Follow up - Trauma Critical Care  Patient Details:    Scott Day is an 60 y.o. male.  Lines/tubes : Flatus Tube/Pouch (Active)  Daily care Skin around tube assessed;Skin barrier applied to rectal area;Assess location of position indicator line;Flushed tube with 26mL water (document as intake);Bag changed (every 24 hours) 01/07/21 0800  Output (mL) 200 mL 01/06/21 1300  Intake (mL) 30 mL 01/06/21 0800     External Urinary Catheter (Active)  Collection Container Dedicated Suction Canister 01/07/21 0800  Suction (Verified suction is between 40-80 mmHg) Yes 01/07/21 0800  Securement Method Tape 01/07/21 0800  Site Assessment Clean;Intact 01/07/21 0800  Intervention Male External Urinary Catheter Replaced 01/06/21 2000  Output (mL) 250 mL 01/07/21 0500    Microbiology/Sepsis markers: Results for orders placed or performed during the hospital encounter of 12/26/20  Resp Panel by RT-PCR (Flu A&B, Covid) Nasopharyngeal Swab     Status: None   Collection Time: 12/26/20  6:55 PM   Specimen: Nasopharyngeal Swab; Nasopharyngeal(NP) swabs in vial transport medium  Result Value Ref Range Status   SARS Coronavirus 2 by RT PCR NEGATIVE NEGATIVE Final    Comment: (NOTE) SARS-CoV-2 target nucleic acids are NOT DETECTED.  The SARS-CoV-2 RNA is generally detectable in upper respiratory specimens during the acute phase of infection. The lowest concentration of SARS-CoV-2 viral copies this assay can detect is 138 copies/mL. A negative result does not preclude SARS-Cov-2 infection and should not be used as the sole basis for treatment or other patient management decisions. A negative result may occur with  improper specimen collection/handling, submission of specimen other than nasopharyngeal swab, presence of viral mutation(s) within the areas targeted by this assay, and inadequate number of  viral copies(<138 copies/mL). A negative result must be combined with clinical observations, patient history, and epidemiological information. The expected result is Negative.  Fact Sheet for Patients:  EntrepreneurPulse.com.au  Fact Sheet for Healthcare Providers:  IncredibleEmployment.be  This test is no t yet approved or cleared by the [place] FDA and  has been authorized for detection and/or diagnosis of SARS-CoV-2 by FDA under an Emergency Use Authorization (EUA). This EUA will remain  in effect (meaning this test can be used) for the duration of the COVID-19 declaration under Section 564(b)(1) of the Act, 21 U.S.C.section 360bbb-3(b)(1), unless the authorization is terminated  or revoked sooner.       Influenza A by PCR NEGATIVE NEGATIVE Final   Influenza B by PCR NEGATIVE NEGATIVE Final    Comment: (NOTE) The Xpert Xpress SARS-CoV-2/FLU/RSV plus assay is intended as an aid in the diagnosis of influenza from Nasopharyngeal swab specimens and should not be used as a sole basis for treatment. Nasal washings and aspirates are unacceptable for Xpert Xpress SARS-CoV-2/FLU/RSV testing.  Fact Sheet for Patients: EntrepreneurPulse.com.au  Fact Sheet for Healthcare Providers: IncredibleEmployment.be  This test is not yet approved or cleared by the [place] FDA and has been authorized for detection and/or diagnosis of SARS-CoV-2 by FDA under an Emergency Use Authorization (EUA). This EUA will remain in effect (meaning this test can be used) for the duration of the COVID-19 declaration under Section 564(b)(1) of the Act, 21 U.S.C. section 360bbb-3(b)(1), unless the authorization is terminated or revoked.  Performed at [facility], [place] [address], [place], [place] [address]   MRSA Next Gen by PCR, Nasal     Status: Abnormal   Collection  Time: 12/27/20 12:14 AM   Specimen: Nasal Mucosa;  Nasal Swab  Result Value Ref Range Status   MRSA by PCR Next Gen DETECTED (A) NOT DETECTED Final    Comment: RESULT CALLED TO, READ BACK BY AND VERIFIED WITH: RN TAYLOR P. 12/27/20@1 :26 BY TW (NOTE) The GeneXpert MRSA Assay (FDA approved for NASAL specimens only), is one component of a comprehensive MRSA colonization surveillance program. It is not intended to diagnose MRSA infection nor to guide or monitor treatment for MRSA infections. Test performance is not FDA approved in patients less than 61 years old. Performed at [facility], [place] [address], [place], [place] [address]   Culture, Respiratory w Gram Stain     Status: None   Collection Time: 12/30/20 10:20 AM   Specimen: Tracheal Aspirate; Respiratory  Result Value Ref Range Status   Specimen Description TRACHEAL ASPIRATE  Final   Special Requests NONE  Final   Gram Stain   Final    FEW WBC PRESENT, PREDOMINANTLY PMN RARE GRAM POSITIVE COCCI IN PAIRS IN CLUSTERS Performed at [facility], [address], [place], [place] [address]    Culture   Final    FEW METHICILLIN RESISTANT STAPHYLOCOCCUS AUREUS FEW STREPTOCOCCUS PNEUMONIAE    Report Status 01/03/2021 FINAL  Final   Organism ID, Bacteria METHICILLIN RESISTANT STAPHYLOCOCCUS AUREUS  Final   Organism ID, Bacteria STREPTOCOCCUS PNEUMONIAE  Final      Susceptibility   Methicillin resistant staphylococcus aureus - MIC*    CIPROFLOXACIN <=0.5 SENSITIVE Sensitive     ERYTHROMYCIN >=8 RESISTANT Resistant     GENTAMICIN <=0.5 SENSITIVE Sensitive     OXACILLIN >=4 RESISTANT Resistant     TETRACYCLINE <=1 SENSITIVE Sensitive     VANCOMYCIN 1 SENSITIVE Sensitive     TRIMETH/SULFA <=10 SENSITIVE Sensitive     CLINDAMYCIN <=0.25 SENSITIVE Sensitive     RIFAMPIN <=0.5 SENSITIVE Sensitive     Inducible Clindamycin NEGATIVE Sensitive     * FEW METHICILLIN RESISTANT STAPHYLOCOCCUS AUREUS   Streptococcus pneumoniae - MIC*    ERYTHROMYCIN <=0.12 SENSITIVE Sensitive      LEVOFLOXACIN 1 SENSITIVE Sensitive     VANCOMYCIN 0.5 SENSITIVE Sensitive     PENO - penicillin <=0.06      PENICILLIN (non-meningitis) <=0.06 SENSITIVE Sensitive     PENICILLIN (oral) <=0.06 SENSITIVE Sensitive     CEFTRIAXONE (non-meningitis) <=0.12 SENSITIVE Sensitive     * FEW STREPTOCOCCUS PNEUMONIAE    Anti-infectives:  Anti-infectives (From admission, onward)    Start     Dose/Rate Route Frequency Ordered Stop   01/06/21 0800  vancomycin (VANCOREADY) IVPB 1250 mg/250 mL        1,250 mg 166.7 mL/hr over 90 Minutes Intravenous Every 8 hours 01/06/21 0129     01/04/21 0000  vancomycin (VANCOREADY) IVPB 1500 mg/300 mL  Status:  Discontinued        1,500 mg 150 mL/hr over 120 Minutes Intravenous Every 12 hours 01/03/21 1149 01/03/21 1153   01/04/21 0000  vancomycin (VANCOREADY) IVPB 1250 mg/250 mL  Status:  Discontinued        1,250 mg 166.7 mL/hr over 90 Minutes Intravenous Every 12 hours 01/03/21 1153 01/06/21 0128   01/03/21 0000  vancomycin (VANCOREADY) IVPB 1750 mg/350 mL  Status:  Discontinued        1,750 mg 175 mL/hr over 120 Minutes Intravenous Every 12 hours 01/02/21 1347 01/03/21 1149   01/02/21 1045  vancomycin (VANCOCIN) 2,500 mg in sodium chloride 0.9 %  500 mL IVPB        2,500 mg 250 mL/hr over 120 Minutes Intravenous  Once 01/02/21 0959 01/02/21 1430   12/26/20 1945  ceFAZolin (ANCEF) IVPB 2g/100 mL premix        2 g 200 mL/hr over 30 Minutes Intravenous  Once 12/26/20 1932 12/26/20 2044       Best Practice/Protocols:  VTE Prophylaxis: Heparin (SQ) Continous Sedation  Consults: Treatment Team:  Dawley, Theodoro Doing, DO    Studies:    Events:  Subjective:    Overnight Issues:   Objective:  Vital signs for last 24 hours: Temp:  [97.7 F (36.5 C)-99.7 F (37.6 C)] 97.7 F (36.5 C) (10/19 0800) Pulse Rate:  [65-170] 86 (10/19 0815) Resp:  [19-38] 30 (10/19 0815) BP: (104-208)/(47-140) 207/73 (10/19 0815) SpO2:  [85 %-100 %] 89 % (10/19  0815) FiO2 (%):  [40 %] 40 % (10/19 0800) Weight:  [270 kg] 124 kg (10/19 0500)  Hemodynamic parameters for last 24 hours:    Intake/Output from previous day: 10/18 0701 - 10/19 0700 In: 1085.9 [I.V.:306; IV Piggyback:749.9] Out: 1550 [Urine:1350; Stool:200]  Intake/Output this shift: Total I/O In: 93.5 [I.V.:29.9; IV Piggyback:63.7] Out: -   Vent settings for last 24 hours: Vent Mode: BIPAP;PCV FiO2 (%):  [40 %] 40 % Set Rate:  [15 bmp] 15 bmp PEEP:  [6 cmH20] 6 cmH20  Physical Exam:  General: BiPAP Neuro: F/C but labored breathing HEENT/Neck: no JVD Resp: rhonchi bilaterally CVS: RRR GI: soft, nontender, BS WNL, no r/g Extremities: edema 2+  Results for orders placed or performed during the hospital encounter of 12/26/20 (from the past 24 hour(s))  Glucose, capillary     Status: Abnormal   Collection Time: 01/06/21 11:33 AM  Result Value Ref Range   Glucose-Capillary 185 (H) 70 - 99 mg/dL  Glucose, capillary     Status: Abnormal   Collection Time: 01/06/21  3:28 PM  Result Value Ref Range   Glucose-Capillary 158 (H) 70 - 99 mg/dL  Glucose, capillary     Status: Abnormal   Collection Time: 01/06/21  7:44 PM  Result Value Ref Range   Glucose-Capillary 129 (H) 70 - 99 mg/dL  Glucose, capillary     Status: Abnormal   Collection Time: 01/06/21 11:29 PM  Result Value Ref Range   Glucose-Capillary 139 (H) 70 - 99 mg/dL  Vancomycin, trough     Status: Abnormal   Collection Time: 01/06/21 11:57 PM  Result Value Ref Range   Vancomycin Tr 22 (HH) 15 - 20 ug/mL  CBC     Status: Abnormal   Collection Time: 01/06/21 11:57 PM  Result Value Ref Range   WBC 11.9 (H) 4.0 - 10.5 K/uL   RBC 2.84 (L) 4.22 - 5.81 MIL/uL   Hemoglobin 9.0 (L) 13.0 - 17.0 g/dL   HCT 28.2 (L) 39.0 - 52.0 %   MCV 99.3 80.0 - 100.0 fL   MCH 31.7 26.0 - 34.0 pg   MCHC 31.9 30.0 - 36.0 g/dL   RDW 12.3 11.5 - 15.5 %   Platelets 352 150 - 400 K/uL   nRBC 0.0 0.0 - 0.2 %  Basic metabolic panel      Status: Abnormal   Collection Time: 01/06/21 11:57 PM  Result Value Ref Range   Sodium 142 135 - 145 mmol/L   Potassium 3.7 3.5 - 5.1 mmol/L   Chloride 103 98 - 111 mmol/L   CO2 30 22 - 32 mmol/L   Glucose, Bld  150 (H) 70 - 99 mg/dL   BUN 21 (H) 6 - 20 mg/dL   Creatinine, Ser 0.79 0.61 - 1.24 mg/dL   Calcium 8.6 (L) 8.9 - 10.3 mg/dL   GFR, Estimated >60 >60 mL/min   Anion gap 9 5 - 15  Glucose, capillary     Status: Abnormal   Collection Time: 01/07/21  3:32 AM  Result Value Ref Range   Glucose-Capillary 136 (H) 70 - 99 mg/dL  I-STAT 7, (LYTES, BLD GAS, ICA, H+H)     Status: Abnormal   Collection Time: 01/07/21  5:32 AM  Result Value Ref Range   pH, Arterial 7.349 (L) 7.350 - 7.450   pCO2 arterial 58.0 (H) 32.0 - 48.0 mmHg   pO2, Arterial 116 (H) 83.0 - 108.0 mmHg   Bicarbonate 31.9 (H) 20.0 - 28.0 mmol/L   TCO2 34 (H) 22 - 32 mmol/L   O2 Saturation 98.0 %   Acid-Base Excess 5.0 (H) 0.0 - 2.0 mmol/L   Sodium 144 135 - 145 mmol/L   Potassium 3.8 3.5 - 5.1 mmol/L   Calcium, Ion 1.27 1.15 - 1.40 mmol/L   HCT 29.0 (L) 39.0 - 52.0 %   Hemoglobin 9.9 (L) 13.0 - 17.0 g/dL   Patient temperature 37.0 C    Collection site Radial    Drawn by RT    Sample type ARTERIAL   Glucose, capillary     Status: Abnormal   Collection Time: 01/07/21  7:46 AM  Result Value Ref Range   Glucose-Capillary 165 (H) 70 - 99 mg/dL    Assessment & Plan: Present on Admission:  TBI (traumatic brain injury)    LOS: 12 days   Additional comments:I reviewed the patient's new clinical lab test results. . MCC  TBI/B SAH and SDH/ICC/L IVH near vertex/hemorrhage along splenium - D/W Dr. Reatha Armour on the unit, he plans F/C CTH at some point but does not feel hygroma will need drainage Acute hypoxic ventilator dependent respiratory failure - extubated 10/17, a lot of secretions and tiring out so reintubate now Scalp hematoma Multiple facial abrasions DM - A1c 8; SSI. Appreciate DM coordinator eval. HX  colon cancer HX HTN - on home lisinopril HX R MCA CVA Substance abuse (opiates, cocaine, alcohol) - was on CIWA for alcohol withdrawal, now Dex and Klon ID - Vanc for MRSA pneumonia On Brilinta, h/o MCS stent and stroke - hold FEN - resume TF, lasix X 1, TF VTE prophylaxis - SCDs, SQH started 10/9 per Benjamin Perez - ICU I spoke with his daughter at the bedside and updated her. I also again discussed trach including procedure, risks, and benefits. She agrees. Critical Care Total Time*: 80 Minutes  Georganna Skeans, MD, MPH, FACS Trauma & General Surgery Use [email or web address] to contact on call provider  01/07/2021  *Care during the described time interval was provided by me. I have reviewed this patient's available data, including medical history, events of note, physical examination and test results as part of my evaluation.

## 2021-01-07 NOTE — Progress Notes (Signed)
PT Cancellation Note  Patient Details Name: Scott Day MRN: [id number] DOB: [birth year]   Cancelled Treatment:    Reason Eval/Treat Not Completed: Patient not medically ready Patient recently went into Afib RVR and labored breathing. Planning for potential intubation. RN request to hold PT evaluation this date. Will re-attempt at later date.   Scott Day PT, DPT Acute Rehabilitation Services Pager [phone] Office [phone]    Scott Day 01/07/2021, 8:03 AM

## 2021-01-07 NOTE — Progress Notes (Signed)
SLP Cancellation Note  Patient Details Name: Scott Day MRN: [id number] DOB: [birth year]   Cancelled treatment:       Reason Eval/Treat Not Completed: Medical issues which prohibited therapy. Not appropriate today. Will f/u   Jizel Cheeks, Katherene Ponto 01/07/2021, 8:12 AM

## 2021-01-07 NOTE — Progress Notes (Addendum)
   01/07/21 0349  Provider Notification  Provider Name/Title Dr. [facility]  Date Provider Notified 01/07/21  Time Provider Notified [phone]  Notification Type Page  Notification Reason Change in status (Patient went into a-fib RVR 150s-180s and labored breathing in the 30s.)  Provider response See new orders;At bedside  Date of Provider Response 01/07/21  Time of Provider Response 0355    Precedex titrated up from .5 to .7 at this time as well.

## 2021-01-07 NOTE — Progress Notes (Addendum)
Called to bedside because of new increase of heart rate from 80s to 170s, increased work of breathing with oxygenation in the high 80% and worsened hypertension. -will give metoprolol IV -will give the previously cancelled diltiazem -concerned about patient wearing out from bipap for > 24 h. Likely need for intubation. Family concerned that he came out of it by himself earlier and hopes it will happen again  Critical care time 60 minutes

## 2021-01-07 NOTE — Anesthesia Procedure Notes (Signed)
Procedure Name: Intubation Date/Time: 01/07/2021 9:01 AM Performed by: Wilburn Cornelia, CRNA Pre-anesthesia Checklist: Patient identified, Emergency Drugs available, Suction available, Patient being monitored and Timeout performed Patient Re-evaluated:Patient Re-evaluated prior to induction Oxygen Delivery Method: Circle system utilized Preoxygenation: Pre-oxygenation with 100% oxygen Induction Type: IV induction Ventilation: Mask ventilation without difficulty Grade View: Grade I Tube type: Oral Tube size: 7.5 mm Number of attempts: 1 Airway Equipment and Method: Stylet and Video-laryngoscopy Placement Confirmation: ETT inserted through vocal cords under direct vision, positive ETCO2, CO2 detector and breath sounds checked- equal and bilateral Secured at: 24 cm Tube secured with: Tape Dental Injury: Teeth and Oropharynx as per pre-operative assessment

## 2021-01-07 NOTE — Progress Notes (Signed)
Peripherally Inserted Central Catheter Placement  The IV Nurse has discussed with the patient and/or persons authorized to consent for the patient, the purpose of this procedure and the potential benefits and risks involved with this procedure.  The benefits include less needle sticks, lab draws from the catheter, and the patient may be discharged home with the catheter. Risks include, but not limited to, infection, bleeding, blood clot (thrombus formation), and puncture of an artery; nerve damage and irregular heartbeat and possibility to perform a PICC exchange if needed/ordered by physician.  Alternatives to this procedure were also discussed.  Bard Power PICC patient education guide, fact sheet on infection prevention and patient information card has been provided to patient /or left at bedside.    PICC Placement Documentation  PICC Triple Lumen 27/74/12 PICC Right Basilic 45 cm 0 cm (Active)  Indication for Insertion or Continuance of Line Prolonged intravenous therapies 01/07/21 1310  Exposed Catheter (cm) 0 cm 01/07/21 1310  Site Assessment Clean;Dry;Intact 01/07/21 1310  Lumen #1 Status Flushed;Saline locked;Blood return noted 01/07/21 1310  Lumen #2 Status Flushed;Saline locked;Blood return noted 01/07/21 1310  Lumen #3 Status Flushed;Blood return noted 01/07/21 1310  Dressing Type Transparent 01/07/21 1310  Dressing Status Clean;Dry;Intact 01/07/21 1310  Antimicrobial disc in place? Yes 01/07/21 1310  Dressing Intervention New dressing;Other (Comment) 01/07/21 1310  Dressing Change Due 01/14/21 01/07/21 1310    Consent signed by daughter at bedside   Synthia Innocent 01/07/2021, 1:11 PM

## 2021-01-07 NOTE — Progress Notes (Signed)
Pharmacy Antibiotic Note  Scott Day is a 60 y.o. male admitted on 12/26/2020 as trauma, now with MRSA pneumonia.  Pharmacy has been consulted for vancomycin dosing.    Vancomycin trough 18 mcg/ml ~8h after last dose this am. Although this is a therapeutic trough, we are aiming for slightly lower trough to get kinetically therapeutic AUC.  Plan: Reduce vancomycin to 1000mg  IV q8h Recheck vancomycin levels at new Css  Height: 5\' 10"  (177.8 cm) Weight: 124 kg (273 lb 5.9 oz) IBW/kg (Calculated) : 73  Temp (24hrs), Avg:98.6 F (37 C), Min:97.7 F (36.5 C), Max:99.3 F (37.4 C)  Recent Labs  Lab 01/02/21 0355 01/02/21 0625 01/03/21 0309 01/04/21 0256 01/05/21 1412 01/05/21 2350 01/06/21 0404 01/06/21 2357 01/07/21 1525  WBC  --  9.3 8.5 8.4  --   --  14.8* 11.9*  --   CREATININE 0.77  --  0.71 0.67  --   --  0.66 0.79  --   VANCOTROUGH  --   --   --   --   --    < >  --  22* 18  VANCOPEAK  --   --   --   --  23*  --   --   --   --    < > = values in this interval not displayed.     Estimated Creatinine Clearance: 129.7 mL/min (by C-G formula based on SCr of 0.79 mg/dL).    No Known Allergies   Thank you for allowing pharmacy to be a part of this patient's care.  Arrie Senate, PharmD, BCPS, [facility] Clinical Pharmacist [phone] Please check AMION for all [place] numbers 01/07/2021

## 2021-01-07 NOTE — Progress Notes (Signed)
Pharmacy Antibiotic Note  Scott Day is a 60 y.o. male admitted on 12/26/2020 as trauma, now with MRSA pneumonia.  Pharmacy has been consulted for vancomycin dosing.    Vancomycin trough was ordered and drawn tonight which resulted at 22 mcg/min - dose was hung ~10 minutes prior to trough being drawn so trough inaccurate.  Plan: Continue vancomycin 1250mg  IV Q8H. Goal AUC 400-550.  Expected AUC 515. F/u need for further vancomycin levels, renal function, and micro data  Height: 5\' 10"  (177.8 cm) Weight: 125 kg (275 lb 9.2 oz) IBW/kg (Calculated) : 73  Temp (24hrs), Avg:98.9 F (37.2 C), Min:98.3 F (36.8 C), Max:99.7 F (37.6 C)  Recent Labs  Lab 01/02/21 0355 01/02/21 0625 01/03/21 0309 01/04/21 0256 01/05/21 1412 01/05/21 2350 01/06/21 0404 01/06/21 2357  WBC  --  9.3 8.5 8.4  --   --  14.8* 11.9*  CREATININE 0.77  --  0.71 0.67  --   --  0.66 0.79  VANCOTROUGH  --   --   --   --   --  7*  --  22*  VANCOPEAK  --   --   --   --  23*  --   --   --      Estimated Creatinine Clearance: 130.3 mL/min (by C-G formula based on SCr of 0.79 mg/dL).    No Known Allergies   Thank you for allowing pharmacy to be a part of this patient's care.  Sherlon Handing, PharmD, BCPS Please see amion for complete clinical pharmacist phone list 01/07/2021 1:16 AM

## 2021-01-07 NOTE — Progress Notes (Signed)
Patient ID: Scott Day, male   DOB: 01/06/1961, 60 y.o.   MRN: [id number] BP soft on sedation. D/C dilt for now. Hold lasix. Neo PRN. Place PICC. Georganna Skeans, MD, MPH, FACS Please use [email or web address] to contact on call provider

## 2021-01-07 NOTE — Progress Notes (Signed)
   01/07/21 1015  Vitals  BP (!) 80/45  MAP (mmHg) (!) 56  Pulse Rate (!) 55  ECG Heart Rate (!) 57  Resp (!) 5  Oxygen Therapy  SpO2 99 %   Contacted Dr. Grandville Silos regarding hypotension and bradycardia. Per VO: Stopped precedex, reduced cardizem to 5, and held lasix.  Will continue to monitor.

## 2021-01-08 ENCOUNTER — Other Ambulatory Visit: Payer: Self-pay | Admitting: Physical Medicine & Rehabilitation

## 2021-01-08 ENCOUNTER — Inpatient Hospital Stay (HOSPITAL_COMMUNITY): Payer: Medicaid Other | Admitting: Certified Registered"

## 2021-01-08 ENCOUNTER — Encounter (HOSPITAL_COMMUNITY): Admission: EM | Disposition: A | Discharge status: Rehabilitation Facility | Payer: Self-pay | Source: Home / Self Care

## 2021-01-08 ENCOUNTER — Encounter (HOSPITAL_COMMUNITY): Payer: Self-pay | Admitting: General Surgery

## 2021-01-08 ENCOUNTER — Inpatient Hospital Stay (HOSPITAL_COMMUNITY): Payer: Medicaid Other

## 2021-01-08 ENCOUNTER — Other Ambulatory Visit: Payer: Self-pay | Admitting: Family Medicine

## 2021-01-08 DIAGNOSIS — E1159 Type 2 diabetes mellitus with other circulatory complications: Secondary | ICD-10-CM

## 2021-01-08 DIAGNOSIS — I152 Hypertension secondary to endocrine disorders: Secondary | ICD-10-CM

## 2021-01-08 HISTORY — PX: TRACHEOSTOMY TUBE PLACEMENT: SHX814

## 2021-01-08 LAB — BASIC METABOLIC PANEL
Anion gap: 7 (ref 5–15)
BUN: 31 mg/dL — ABNORMAL HIGH (ref 6–20)
CO2: 29 mmol/L (ref 22–32)
Calcium: 8.3 mg/dL — ABNORMAL LOW (ref 8.9–10.3)
Chloride: 107 mmol/L (ref 98–111)
Creatinine, Ser: 0.73 mg/dL (ref 0.61–1.24)
GFR, Estimated: >60 mL/min (ref >60)
Glucose, Bld: 164 mg/dL — ABNORMAL HIGH (ref 70–99)
Potassium: 3.3 mmol/L — ABNORMAL LOW (ref 3.5–5.1)
Sodium: 143 mmol/L (ref 135–145)

## 2021-01-08 LAB — GLUCOSE, CAPILLARY
Glucose-Capillary: 160 mg/dL — ABNORMAL HIGH (ref 70–99)
Glucose-Capillary: 165 mg/dL — ABNORMAL HIGH (ref 70–99)
Glucose-Capillary: 228 mg/dL — ABNORMAL HIGH (ref 70–99)
Glucose-Capillary: 282 mg/dL — ABNORMAL HIGH (ref 70–99)
Glucose-Capillary: 257 mg/dL — ABNORMAL HIGH (ref 70–99)
Glucose-Capillary: 231 mg/dL — ABNORMAL HIGH (ref 70–99)

## 2021-01-08 LAB — POCT I-STAT 7, (LYTES, BLD GAS, ICA,H+H)
Acid-Base Excess: 7 mmol/L — ABNORMAL HIGH (ref 0.0–2.0)
Bicarbonate: 32.3 mmol/L — ABNORMAL HIGH (ref 20.0–28.0)
Calcium, Ion: 1.24 mmol/L (ref 1.15–1.40)
HCT: 24 % — ABNORMAL LOW (ref 39.0–52.0)
Hemoglobin: 8.2 g/dL — ABNORMAL LOW (ref 13.0–17.0)
O2 Saturation: 94 %
Patient temperature: 37.1
Potassium: 3.4 mmol/L — ABNORMAL LOW (ref 3.5–5.1)
Sodium: 146 mmol/L — ABNORMAL HIGH (ref 135–145)
TCO2: 34 mmol/L — ABNORMAL HIGH (ref 22–32)
pCO2 arterial: 47 mmHg (ref 32.0–48.0)
pH, Arterial: 7.446 (ref 7.350–7.450)
pO2, Arterial: 69 mmHg — ABNORMAL LOW (ref 83.0–108.0)

## 2021-01-08 LAB — CBC
HCT: 27.7 % — ABNORMAL LOW (ref 39.0–52.0)
Hemoglobin: 8.6 g/dL — ABNORMAL LOW (ref 13.0–17.0)
MCH: 31.6 pg (ref 26.0–34.0)
MCHC: 31 g/dL (ref 30.0–36.0)
MCV: 101.8 fL — ABNORMAL HIGH (ref 80.0–100.0)
Platelets: 386 10*3/uL (ref 150–400)
RBC: 2.72 MIL/uL — ABNORMAL LOW (ref 4.22–5.81)
RDW: 12.6 % (ref 11.5–15.5)
WBC: 9.7 10*3/uL (ref 4.0–10.5)
nRBC: 0 % (ref 0.0–0.2)

## 2021-01-08 SURGERY — CREATION, TRACHEOSTOMY
Anesthesia: General | Site: Neck

## 2021-01-08 MED ORDER — SODIUM CHLORIDE 0.9 % IV SOLN: INTRAVENOUS | Status: DC | PRN

## 2021-01-08 MED ORDER — 0.9 % SODIUM CHLORIDE (POUR BTL) OPTIME
TOPICAL | Status: DC | PRN
  Administered 2021-01-08: 1000 mL

## 2021-01-08 MED ORDER — FENTANYL CITRATE (PF) 250 MCG/5ML IJ SOLN
INTRAMUSCULAR | Status: AC
  Filled 2021-01-08: qty 5

## 2021-01-08 MED ORDER — POTASSIUM CHLORIDE 20 MEQ PO PACK
40.0000 meq | PACK | Freq: Once | ORAL | Status: AC
  Administered 2021-01-08: 40 meq
  Filled 2021-01-08: qty 2

## 2021-01-08 MED ORDER — MIDAZOLAM HCL 2 MG/2ML IJ SOLN
INTRAMUSCULAR | Status: AC
  Filled 2021-01-08: qty 2

## 2021-01-08 MED ORDER — PROPOFOL 10 MG/ML IV BOLUS
INTRAVENOUS | Status: AC
  Filled 2021-01-08: qty 40

## 2021-01-08 MED ORDER — DEXAMETHASONE SODIUM PHOSPHATE 10 MG/ML IJ SOLN
INTRAMUSCULAR | Status: DC | PRN
  Administered 2021-01-08: 10 mg via INTRAVENOUS

## 2021-01-08 MED ORDER — MIDAZOLAM HCL 5 MG/5ML IJ SOLN
INTRAMUSCULAR | Status: DC | PRN
  Administered 2021-01-08: 2 mg via INTRAVENOUS

## 2021-01-08 MED ORDER — PROPOFOL 10 MG/ML IV BOLUS
INTRAVENOUS | Status: DC | PRN
  Administered 2021-01-08: 30 mg via INTRAVENOUS

## 2021-01-08 MED ORDER — FENTANYL CITRATE (PF) 100 MCG/2ML IJ SOLN
INTRAMUSCULAR | Status: DC | PRN
  Administered 2021-01-08 (×2): 50 ug via INTRAVENOUS

## 2021-01-08 MED ORDER — ROCURONIUM BROMIDE 100 MG/10ML IV SOLN
INTRAVENOUS | Status: DC | PRN
Start: 2021-01-08 — End: 2021-01-08
  Administered 2021-01-08: 20 mg via INTRAVENOUS
  Administered 2021-01-08: 80 mg via INTRAVENOUS

## 2021-01-08 MED ORDER — NON FORMULARY
Status: DC | PRN
  Administered 2021-01-08: 6 mL via SURGICAL_CAVITY

## 2021-01-08 MED ORDER — VASOPRESSIN 20 UNIT/ML IV SOLN
INTRAVENOUS | Status: AC
  Filled 2021-01-08: qty 1

## 2021-01-08 MED FILL — Lidocaine Inj 1.5% w/ Epinephrine-1:200000 (PF): INTRAMUSCULAR | Qty: 30 | Status: AC

## 2021-01-08 SURGICAL SUPPLY — 34 items
BAG COUNTER SPONGE SURGICOUNT (BAG) ×2 IMPLANT
BAG SPNG CNTER NS LX DISP (BAG) ×1
BLADE CLIPPER SURG (BLADE) IMPLANT
CANISTER SUCT 3000ML PPV (MISCELLANEOUS) ×2 IMPLANT
COVER SURGICAL LIGHT HANDLE (MISCELLANEOUS) ×2 IMPLANT
DRAPE UTILITY XL STRL (DRAPES) ×2 IMPLANT
ELECT CAUTERY BLADE 6.4 (BLADE) ×2 IMPLANT
ELECT REM PT RETURN 9FT ADLT (ELECTROSURGICAL) ×2
ELECTRODE REM PT RTRN 9FT ADLT (ELECTROSURGICAL) ×1 IMPLANT
GAUZE 4X4 16PLY ~~LOC~~+RFID DBL (SPONGE) ×2 IMPLANT
GLOVE SRG 8 PF TXTR STRL LF DI (GLOVE) ×1 IMPLANT
GLOVE SURG ENC MOIS LTX SZ8 (GLOVE) ×2 IMPLANT
GLOVE SURG UNDER POLY LF SZ8 (GLOVE) ×2
GOWN STRL REUS W/ TWL LRG LVL3 (GOWN DISPOSABLE) ×1 IMPLANT
GOWN STRL REUS W/ TWL XL LVL3 (GOWN DISPOSABLE) ×1 IMPLANT
GOWN STRL REUS W/TWL LRG LVL3 (GOWN DISPOSABLE) ×2
GOWN STRL REUS W/TWL XL LVL3 (GOWN DISPOSABLE) ×2
INTRODUCER TRACH BLUE RHINO 6F (TUBING) IMPLANT
INTRODUCER TRACH BLUE RHINO 8F (TUBING) IMPLANT
KIT BASIN OR (CUSTOM PROCEDURE TRAY) ×2 IMPLANT
KIT TURNOVER KIT B (KITS) ×2 IMPLANT
NS IRRIG 1000ML POUR BTL (IV SOLUTION) ×2 IMPLANT
PACK EENT II TURBAN DRAPE (CUSTOM PROCEDURE TRAY) ×2 IMPLANT
PAD ARMBOARD 7.5X6 YLW CONV (MISCELLANEOUS) ×4 IMPLANT
PENCIL BUTTON HOLSTER BLD 10FT (ELECTRODE) ×2 IMPLANT
SPONGE DRAIN TRACH 4X4 STRL 2S (GAUZE/BANDAGES/DRESSINGS) ×2 IMPLANT
SPONGE INTESTINAL PEANUT (DISPOSABLE) ×2 IMPLANT
SUT VICRYL AB 3 0 TIES (SUTURE) ×2 IMPLANT
SYR 20ML LL LF (SYRINGE) ×2 IMPLANT
TOWEL GREEN STERILE (TOWEL DISPOSABLE) ×2 IMPLANT
TOWEL GREEN STERILE FF (TOWEL DISPOSABLE) ×2 IMPLANT
TRAY W/TRACH TUBE 7.5 (MISCELLANEOUS) ×2 IMPLANT
TUBE CONNECTING 12X1/4 (SUCTIONS) ×2 IMPLANT
TUBE TRACH 6.0 EXL PROX CUF (TUBING) ×2 IMPLANT

## 2021-01-08 NOTE — Progress Notes (Addendum)
Inpatient Rehab Admissions Coordinator :  Per therapy recommendations patient was screened for CIR candidacy by Danne Baxter RN MSN. Patient is not yet at a level to tolerate the intensity required to pursue a CIR admit due to just trached today and await vent wean and more participation with therapy. Patient may have the potential to progress to become a candidate. The CIR admissions team will follow and monitor for progress and place a Rehab Consult order if felt to be appropriate. Please contact me with any questions.  Danne Baxter RN MSN Admissions Coordinator [phone]

## 2021-01-08 NOTE — Progress Notes (Signed)
Nutrition Follow-up  DOCUMENTATION CODES:   Not applicable  INTERVENTION:   Tube feeding via Cortrak tube: Pivot 1.5 at 65 ml/h (1560 ml per day)  Provides 2340 kcal, 146 gm protein, 1184 ml free water daily   NUTRITION DIAGNOSIS:   Increased nutrient needs related to  (TBI) as evidenced by estimated needs. Ongoing.   GOAL:   Patient will meet greater than or equal to 90% of their needs Met with TF.   MONITOR:   Vent status, TF tolerance  REASON FOR ASSESSMENT:   Consult Enteral/tube feeding initiation and management  ASSESSMENT:   Pt with PMH of DM, colon ca, R MCA CVA now admitted after [facility] with TBI, B SAH, ICC, scalp hematoma, and multiple facial abrasions.     Pt discussed during ICU rounds and with RN.  Per trauma pt positive for opiates and cocaine on admission.  Per RN pt with small amount of loose stool via rectal tube. Stool is liquid.  Pt started on neo 10/19 due to hypotension   10/7 intubated 10/12 Cortrak placed; tip gastric  10/17 extubated 10/19 intubated 10/20 trach placed  Patient is currently on ventilator support via trach MV: 16.9 L/min Temp (24hrs), Avg:98.7 F (37.1 C), Min:98.2 F (36.8 C), Max:99.1 F (37.3 C)  Medications reviewed and include: colace, folic acid, SSI, novolog, MVI with minerals, protonix, miralax, thiamine  Precedex Fentanyl  Neo-synephrine @ 25 mcg   Labs reviewed K 3.3 CBG's: 181-217     Diet Order:   Diet Order             Diet NPO time specified  Diet effective now                   EDUCATION NEEDS:   Not appropriate for education at this time  Skin:  Skin Assessment: Reviewed RN Assessment  Last BM:  10/20 via rectal tube  Height:   Ht Readings from Last 1 Encounters:  12/30/20 5' 10"  (1.778 m)    Weight:   Wt Readings from Last 1 Encounters:  01/08/21 125.4 kg    BMI:  Body mass index is 39.67 kg/m.  Estimated Nutritional Needs:   Kcal:  2100-2300  Protein:   120-140 grams  Fluid:  >2 L/day  Lockie Pares., RD, LDN, CNSC See AMiON for contact information
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
## 2021-01-08 NOTE — Op Note (Signed)
  01/08/2021  8:24 AM  PATIENT:  Scott Day  60 y.o. male  PRE-OPERATIVE DIAGNOSIS:  respiratory failure  POST-OPERATIVE DIAGNOSIS:  respiratory failure  PROCEDURE:  Procedure(s): TRACHEOSTOMY  SURGEON:  Surgeon(s): Georganna Skeans, MD  ASSISTANTS: Melina Modena, PA-C   ANESTHESIA:   local and general  EBL:  No intake/output data recorded.  BLOOD ADMINISTERED:none  DRAINS: none   SPECIMEN:  No Specimen  DISPOSITION OF SPECIMEN:  N/A  COUNTS:  YES  DICTATION: .Dragon Dictation Procedure in detail: Informed consent was obtained.  He he is currently receiving intravenous antibiotics.  He was brought directly from the intensive care unit to the operating room on the ventilator.  General anesthesia was administered by the anesthesia staff.  He was appropriately positioned and his anterior neck was prepped and draped in sterile fashion.  We did a timeout procedure.  I injected local 2 cm cephalad to the sternal notch.  A vertical incision was made.  Subcutaneous tissues were dissected down through the platysma revealing the strap muscles.  They were divided along the midline exposing the thyroid isthmus.  This was then divided using cautery carefully.  I then placed figure-of-eight 2-0 silk for good hemostasis.  The anterior surface of the trachea was identified and cleared away nicely.  Angiocath was inserted between the second and third tracheal ring followed by a guide wire.  The small blue dilator was placed followed by the large Blue Rhino dilator.  Then I placed a size 6 long proximal XLT trach over a 24 [place] dilator.  It passed easily into the trachea.  The inner cannula was applied and the cuff was inflated.  It was attached to the ventilator circuit and excellent volume returns were obtained.  Saturations remained in the 90s.  A dressing sponge was applied and the trach was secured to the skin with Prolene sutures.  Velcro trach tie was applied.  There was excellent  hemostasis.  All counts were correct.  There were no apparent complications.  He was taken directly back to the intensive care unit in critical condition. PATIENT DISPOSITION:  ICU - intubated and critically ill.   Delay start of Pharmacological VTE agent (>24hrs) due to surgical blood loss or risk of bleeding:  no  Georganna Skeans, MD, MPH, FACS Pager: [phone]  10/20/20228:24 AM

## 2021-01-08 NOTE — Telephone Encounter (Signed)
Requested Prescriptions  Pending Prescriptions Disp Refills  . lisinopril (ZESTRIL) 2.5 MG tablet [Pharmacy Med Name: LISINOPRIL 2.5MG  TABLETS] 90 tablet 0    Sig: TAKE 1 TABLET(2.5 MG) BY MOUTH DAILY     Cardiovascular:  ACE Inhibitors Failed - 01/08/2021  6:35 AM      Failed - Last BP in normal range    BP Readings from Last 1 Encounters:  11/05/20 (!) 147/92         Passed - Cr in normal range and within 180 days    Creatinine, Ser  Date Value Ref Range Status  09/16/2020 1.00 0.61 - 1.24 mg/dL Final         Passed - K in normal range and within 180 days    Potassium  Date Value Ref Range Status  09/16/2020 3.7 3.5 - 5.1 mmol/L Final         Passed - Patient is not pregnant      Passed - Valid encounter within last 6 months    Recent Outpatient Visits          3 months ago Other male erectile dysfunction   [place], Charlane Ferretti, MD   8 months ago Diabetes mellitus, new onset [facility])   [place], Charlane Ferretti, MD   11 months ago Diabetes mellitus, new onset [facility])   [facility] [place], [place], MD   1 year ago Cerebrovascular accident (CVA) due to thrombosis of right middle cerebral artery [facility])   [facility] [place], Charlane Ferretti, MD

## 2021-01-08 NOTE — Transfer of Care (Signed)
Immediate Anesthesia Transfer of Care Note  Patient: Scott Day  Procedure(s) Performed: TRACHEOSTOMY (Neck)  Patient Location: ICU  Anesthesia Type:General  Level of Consciousness: sedated  Airway & Oxygen Therapy: Patient remains intubated per anesthesia plan and Patient placed on Ventilator (see vital sign flow sheet for setting)  Post-op Assessment: Report given to RN and Post -op Vital signs reviewed and stable  Post vital signs: Reviewed and stable  Last Vitals:  Vitals Value Taken Time  BP    Temp    Pulse 88 01/08/21 0843  Resp 18 01/08/21 0843  SpO2 97 % 01/08/21 0843  Vitals shown include unvalidated device data.  Last Pain:  Vitals:   01/08/21 0400  TempSrc: Axillary  PainSc:          Complications: No notable events documented.

## 2021-01-08 NOTE — Progress Notes (Signed)
OT Cancellation Note  Patient Details Name: Scott Day MRN: [id number] DOB: 1960/05/21   Cancelled Treatment:    Reason Eval/Treat Not Completed: Patient at procedure or test/ unavailable (In surgery. Will follow up when medically appropriate.)  [name],[name] 01/08/2021, 7:48 AM Maurie Boettcher, OT/L   Acute OT Clinical Specialist Acute Rehabilitation Services Pager [phone] Office [phone]
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
## 2021-01-08 NOTE — Progress Notes (Signed)
SLP Cancellation Note  Patient Details Name: Jsoeph Podesta MRN: [id number] DOB: 1960-04-09   Cancelled treatment:       Reason Eval/Treat Not Completed: Patient at procedure or test/unavailable   Katarina Riebe, Katherene Ponto 01/08/2021, 7:56 AM

## 2021-01-08 NOTE — Anesthesia Preprocedure Evaluation (Addendum)
Anesthesia Evaluation  Patient identified by MRN, date of birth, ID band Patient unresponsive    Reviewed: Allergy & Precautions, NPO status , Patient's Chart, lab work & pertinent test results  History of Anesthesia Complications Negative for: history of anesthetic complications  Airway Mallampati: Intubated       Dental  (+) Poor Dentition   Pulmonary  Vent dependence  Ph 7.44 pCO2 47 pO2 69 FiO2 40% Peep 8   breath sounds clear to auscultation       Cardiovascular negative cardio ROS   Rhythm:Regular     Neuro/Psych negative neurological ROS  negative psych ROS   GI/Hepatic negative GI ROS, (+)     substance abuse  cocaine use,   Endo/Other  negative endocrine ROS  Renal/GU Lab Results      Component                Value               Date                      CREATININE               0.73                01/08/2021                Musculoskeletal negative musculoskeletal ROS (+)   Abdominal   Peds  Hematology  (+) Blood dyscrasia, anemia , Lab Results      Component                Value               Date                      WBC                      9.7                 01/08/2021                HGB                      8.6 (L)             01/08/2021                HCT                      27.7 (L)            01/08/2021                MCV                      101.8 (H)           01/08/2021                PLT                      386                 01/08/2021              Anesthesia Other Findings   Reproductive/Obstetrics  Anesthesia Physical Anesthesia Plan  ASA: 4  Anesthesia Plan: General   Post-op Pain Management:    Induction:   PONV Risk Score and Plan: 2  Airway Management Planned: Oral ETT and Tracheostomy  Additional Equipment:   Intra-op Plan:   Post-operative Plan: Post-operative intubation/ventilation  Informed Consent:    Plan Discussed with: CRNA and Anesthesiologist  Anesthesia Plan Comments:         Anesthesia Quick Evaluation

## 2021-01-08 NOTE — Anesthesia Postprocedure Evaluation (Signed)
Anesthesia Post Note  Patient: Scott Day  Procedure(s) Performed: TRACHEOSTOMY (Neck)     Patient location during evaluation: ICU Anesthesia Type: General Level of consciousness: sedated Pain management: pain level controlled Vital Signs Assessment: post-procedure vital signs reviewed and stable Respiratory status: respiratory function stable and patient on ventilator - see flowsheet for VS Cardiovascular status: blood pressure returned to baseline and stable Postop Assessment: no apparent nausea or vomiting Anesthetic complications: no   No notable events documented.  Last Vitals:  Vitals:   01/08/21 1715 01/08/21 1730  BP: 138/63 127/62  Pulse: 93 88  Resp: 13 20  Temp:    SpO2: 100% 100%    Last Pain:  Vitals:   01/08/21 1600  TempSrc: Oral  PainSc:                  Scott Day

## 2021-01-09 ENCOUNTER — Encounter (HOSPITAL_COMMUNITY): Payer: Self-pay | Admitting: General Surgery

## 2021-01-09 DIAGNOSIS — I480 Paroxysmal atrial fibrillation: Secondary | ICD-10-CM | POA: Diagnosis not present

## 2021-01-09 LAB — VANCOMYCIN, TROUGH: Vancomycin Tr: 16 ug/mL (ref 15–20)

## 2021-01-09 LAB — CBC
HCT: 29.7 % — ABNORMAL LOW (ref 39.0–52.0)
Hemoglobin: 9 g/dL — ABNORMAL LOW (ref 13.0–17.0)
MCH: 31 pg (ref 26.0–34.0)
MCHC: 30.3 g/dL (ref 30.0–36.0)
MCV: 102.4 fL — ABNORMAL HIGH (ref 80.0–100.0)
Platelets: 436 10*3/uL — ABNORMAL HIGH (ref 150–400)
RBC: 2.9 MIL/uL — ABNORMAL LOW (ref 4.22–5.81)
RDW: 12.3 % (ref 11.5–15.5)
WBC: 12.8 10*3/uL — ABNORMAL HIGH (ref 4.0–10.5)
nRBC: 0 % (ref 0.0–0.2)

## 2021-01-09 LAB — BASIC METABOLIC PANEL
Anion gap: 8 (ref 5–15)
BUN: 28 mg/dL — ABNORMAL HIGH (ref 6–20)
CO2: 31 mmol/L (ref 22–32)
Calcium: 8.8 mg/dL — ABNORMAL LOW (ref 8.9–10.3)
Chloride: 108 mmol/L (ref 98–111)
Creatinine, Ser: 0.74 mg/dL (ref 0.61–1.24)
GFR, Estimated: >60 mL/min (ref >60)
Glucose, Bld: 149 mg/dL — ABNORMAL HIGH (ref 70–99)
Potassium: 3.9 mmol/L (ref 3.5–5.1)
Sodium: 147 mmol/L — ABNORMAL HIGH (ref 135–145)

## 2021-01-09 LAB — VANCOMYCIN, PEAK: Vancomycin Pk: 45 ug/mL — ABNORMAL HIGH (ref 30–40)

## 2021-01-09 LAB — GLUCOSE, CAPILLARY
Glucose-Capillary: 135 mg/dL — ABNORMAL HIGH (ref 70–99)
Glucose-Capillary: 149 mg/dL — ABNORMAL HIGH (ref 70–99)
Glucose-Capillary: 181 mg/dL — ABNORMAL HIGH (ref 70–99)
Glucose-Capillary: 179 mg/dL — ABNORMAL HIGH (ref 70–99)
Glucose-Capillary: 227 mg/dL — ABNORMAL HIGH (ref 70–99)
Glucose-Capillary: 199 mg/dL — ABNORMAL HIGH (ref 70–99)

## 2021-01-09 MED ORDER — METOPROLOL TARTRATE 25 MG/10 ML ORAL SUSPENSION
12.5000 mg | Freq: Two times a day (BID) | ORAL | Status: DC
  Administered 2021-01-09 – 2021-01-15 (×12): 12.5 mg
  Filled 2021-01-09 (×11): qty 10

## 2021-01-09 MED ORDER — METOLAZONE 5 MG PO TABS
5.0000 mg | ORAL_TABLET | Freq: Once | ORAL | Status: AC
  Administered 2021-01-09: 5 mg
  Filled 2021-01-09: qty 1

## 2021-01-09 MED ORDER — FREE WATER
200.0000 mL | Freq: Three times a day (TID) | Status: DC
  Administered 2021-01-09 – 2021-01-17 (×25): 200 mL

## 2021-01-09 MED ORDER — FUROSEMIDE 10 MG/ML IJ SOLN
40.0000 mg | Freq: Once | INTRAMUSCULAR | Status: AC
  Administered 2021-01-09: 40 mg via INTRAVENOUS
  Filled 2021-01-09: qty 4

## 2021-01-09 MED ORDER — METOPROLOL TARTRATE 25 MG/10 ML ORAL SUSPENSION
12.5000 mg | Freq: Two times a day (BID) | ORAL | Status: DC
  Filled 2021-01-09: qty 10

## 2021-01-09 MED ORDER — VANCOMYCIN HCL 750 MG/150ML IV SOLN
750.0000 mg | Freq: Three times a day (TID) | INTRAVENOUS | Status: DC
  Administered 2021-01-10 – 2021-01-12 (×7): 750 mg via INTRAVENOUS
  Filled 2021-01-09 (×8): qty 150

## 2021-01-09 MED ORDER — POTASSIUM CHLORIDE 20 MEQ PO PACK
20.0000 meq | PACK | Freq: Once | ORAL | Status: AC
  Administered 2021-01-09: 20 meq
  Filled 2021-01-09: qty 1
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
## 2021-01-09 NOTE — Progress Notes (Signed)
Patient ID: Scott Day, male   DOB: 12-17-1960, 60 y.o.   MRN: [id number] Follow up - Trauma Critical Care  Patient Details:    Scott Day is an 60 y.o. male.  Lines/tubes : PICC Triple Lumen 79/89/21 PICC Right Basilic 45 cm 0 cm (Active)  Indication for Insertion or Continuance of Line Vasoactive infusions;Prolonged intravenous therapies 01/08/21 2000  Exposed Catheter (cm) 0 cm 01/07/21 1310  Site Assessment Clean;Dry;Intact 01/08/21 2000  Lumen #1 Status Infusing;In-line blood sampling system in place;Blood return noted 01/08/21 2000  Lumen #2 Status Infusing 01/08/21 2000  Lumen #3 Status Infusing 01/08/21 2000  Dressing Type Transparent 01/08/21 2000  Dressing Status Clean;Dry;Intact 01/08/21 2000  Antimicrobial disc in place? Yes 01/08/21 2000  Line Care Connections checked and tightened 01/08/21 2000  Dressing Intervention Other (Comment) 01/07/21 2000  Dressing Change Due 01/14/21 01/08/21 2000     Flatus Tube/Pouch (Active)  Daily care Skin around tube assessed 01/08/21 2000  Output (mL) 0 mL 01/08/21 1800  Intake (mL) 30 mL 01/06/21 0800     External Urinary Catheter (Active)  Collection Container Standard drainage bag 01/08/21 2000  Suction (Verified suction is between 40-80 mmHg) N/A (Patient has condom catheter) 01/08/21 2000  Securement Method Securing device (Describe) 01/08/21 2000  Site Assessment Clean;Intact 01/08/21 2000  Intervention Male External Urinary Catheter Replaced 01/08/21 0730  Output (mL) 950 mL 01/09/21 0600    Microbiology/Sepsis markers: Results for orders placed or performed during the hospital encounter of 12/26/20  Resp Panel by RT-PCR (Flu A&B, Covid) Nasopharyngeal Swab     Status: None   Collection Time: 12/26/20  6:55 PM   Specimen: Nasopharyngeal Swab; Nasopharyngeal(NP) swabs in vial transport medium  Result Value Ref Range Status   SARS Coronavirus 2 by RT PCR NEGATIVE NEGATIVE Final    Comment:  (NOTE) SARS-CoV-2 target nucleic acids are NOT DETECTED.  The SARS-CoV-2 RNA is generally detectable in upper respiratory specimens during the acute phase of infection. The lowest concentration of SARS-CoV-2 viral copies this assay can detect is 138 copies/mL. A negative result does not preclude SARS-Cov-2 infection and should not be used as the sole basis for treatment or other patient management decisions. A negative result may occur with  improper specimen collection/handling, submission of specimen other than nasopharyngeal swab, presence of viral mutation(s) within the areas targeted by this assay, and inadequate number of viral copies(<138 copies/mL). A negative result must be combined with clinical observations, patient history, and epidemiological information. The expected result is Negative.  Fact Sheet for Patients:  EntrepreneurPulse.com.au  Fact Sheet for Healthcare Providers:  IncredibleEmployment.be  This test is no t yet approved or cleared by the [place] FDA and  has been authorized for detection and/or diagnosis of SARS-CoV-2 by FDA under an Emergency Use Authorization (EUA). This EUA will remain  in effect (meaning this test can be used) for the duration of the COVID-19 declaration under Section 564(b)(1) of the Act, 21 U.S.C.section 360bbb-3(b)(1), unless the authorization is terminated  or revoked sooner.       Influenza A by PCR NEGATIVE NEGATIVE Final   Influenza B by PCR NEGATIVE NEGATIVE Final    Comment: (NOTE) The Xpert Xpress SARS-CoV-2/FLU/RSV plus assay is intended as an aid in the diagnosis of influenza from Nasopharyngeal swab specimens and should not be used as a sole basis for treatment. Nasal washings and aspirates are unacceptable for Xpert Xpress SARS-CoV-2/FLU/RSV testing.  Fact Sheet for Patients: EntrepreneurPulse.com.au  Fact Sheet for Healthcare  Providers: IncredibleEmployment.be  This test is not yet approved or cleared by the [place] and has been authorized for detection and/or diagnosis of SARS-CoV-2 by FDA under an Emergency Use Authorization (EUA). This EUA will remain in effect (meaning this test can be used) for the duration of the COVID-19 declaration under Section 564(b)(1) of the Act, 21 U.S.C. section 360bbb-3(b)(1), unless the authorization is terminated or revoked.  Performed at [facility], [place] [address], [place], [place] [address]   MRSA Next Gen by PCR, Nasal     Status: Abnormal   Collection Time: 12/27/20 12:14 AM   Specimen: Nasal Mucosa; Nasal Swab  Result Value Ref Range Status   MRSA by PCR Next Gen DETECTED (A) NOT DETECTED Final    Comment: RESULT CALLED TO, READ BACK BY AND VERIFIED WITH: RN TAYLOR P. 12/27/20@1 :26 BY TW (NOTE) The GeneXpert MRSA Assay (FDA approved for NASAL specimens only), is one component of a comprehensive MRSA colonization surveillance program. It is not intended to diagnose MRSA infection nor to guide or monitor treatment for MRSA infections. Test performance is not FDA approved in patients less than 15 years old. Performed at [facility], [place] [address], [place], [place] [address]   Culture, Respiratory w Gram Stain     Status: None   Collection Time: 12/30/20 10:20 AM   Specimen: Tracheal Aspirate; Respiratory  Result Value Ref Range Status   Specimen Description TRACHEAL ASPIRATE  Final   Special Requests NONE  Final   Gram Stain   Final    FEW WBC PRESENT, PREDOMINANTLY PMN RARE GRAM POSITIVE COCCI IN PAIRS IN CLUSTERS Performed at [facility], [address] [place], [place] [address]    Culture   Final    FEW METHICILLIN RESISTANT STAPHYLOCOCCUS AUREUS FEW STREPTOCOCCUS PNEUMONIAE    Report Status 01/03/2021 FINAL  Final   Organism ID, Bacteria METHICILLIN RESISTANT STAPHYLOCOCCUS AUREUS  Final   Organism  ID, Bacteria STREPTOCOCCUS PNEUMONIAE  Final      Susceptibility   Methicillin resistant staphylococcus aureus - MIC*    CIPROFLOXACIN <=0.5 SENSITIVE Sensitive     ERYTHROMYCIN >=8 RESISTANT Resistant     GENTAMICIN <=0.5 SENSITIVE Sensitive     OXACILLIN >=4 RESISTANT Resistant     TETRACYCLINE <=1 SENSITIVE Sensitive     VANCOMYCIN 1 SENSITIVE Sensitive     TRIMETH/SULFA <=10 SENSITIVE Sensitive     CLINDAMYCIN <=0.25 SENSITIVE Sensitive     RIFAMPIN <=0.5 SENSITIVE Sensitive     Inducible Clindamycin NEGATIVE Sensitive     * FEW METHICILLIN RESISTANT STAPHYLOCOCCUS AUREUS   Streptococcus pneumoniae - MIC*    ERYTHROMYCIN <=0.12 SENSITIVE Sensitive     LEVOFLOXACIN 1 SENSITIVE Sensitive     VANCOMYCIN 0.5 SENSITIVE Sensitive     PENO - penicillin <=0.06      PENICILLIN (non-meningitis) <=0.06 SENSITIVE Sensitive     PENICILLIN (oral) <=0.06 SENSITIVE Sensitive     CEFTRIAXONE (non-meningitis) <=0.12 SENSITIVE Sensitive     * FEW STREPTOCOCCUS PNEUMONIAE    Anti-infectives:  Anti-infectives (From admission, onward)    Start     Dose/Rate Route Frequency Ordered Stop   01/08/21 0000  vancomycin (VANCOCIN) IVPB 1000 mg/200 mL premix        1,000 mg 200 mL/hr over 60 Minutes Intravenous Every 8 hours 01/07/21 1630     01/06/21 0800  vancomycin (VANCOREADY) IVPB 1250 mg/250 mL  Status:  Discontinued        1,250 mg  166.7 mL/hr over 90 Minutes Intravenous Every 8 hours 01/06/21 0129 01/07/21 1630   01/04/21 0000  vancomycin (VANCOREADY) IVPB 1500 mg/300 mL  Status:  Discontinued        1,500 mg 150 mL/hr over 120 Minutes Intravenous Every 12 hours 01/03/21 1149 01/03/21 1153   01/04/21 0000  vancomycin (VANCOREADY) IVPB 1250 mg/250 mL  Status:  Discontinued        1,250 mg 166.7 mL/hr over 90 Minutes Intravenous Every 12 hours 01/03/21 1153 01/06/21 0128   01/03/21 0000  vancomycin (VANCOREADY) IVPB 1750 mg/350 mL  Status:  Discontinued        1,750 mg 175 mL/hr over 120  Minutes Intravenous Every 12 hours 01/02/21 1347 01/03/21 1149   01/02/21 1045  vancomycin (VANCOCIN) 2,500 mg in sodium chloride 0.9 % 500 mL IVPB        2,500 mg 250 mL/hr over 120 Minutes Intravenous  Once 01/02/21 0959 01/02/21 1430   12/26/20 1945  ceFAZolin (ANCEF) IVPB 2g/100 mL premix        2 g 200 mL/hr over 30 Minutes Intravenous  Once 12/26/20 1932 12/26/20 2044       Best Practice/Protocols:  VTE Prophylaxis: Heparin (SQ) Intermittent Sedation  Consults: Treatment Team:  Karsten Ro, DO    Studies:    Events:  Subjective:    Overnight Issues:   Objective:  Vital signs for last 24 hours: Temp:  [98.2 F (36.8 C)-98.8 F (37.1 C)] 98.3 F (36.8 C) (10/21 0400) Pulse Rate:  [66-106] 105 (10/21 0800) Resp:  [0-24] 20 (10/21 0800) BP: (104-161)/(49-115) 139/64 (10/21 0800) SpO2:  [97 %-100 %] 100 % (10/21 0800) FiO2 (%):  [40 %-50 %] 40 % (10/21 0800) Weight:  [277 kg] 122 kg (10/21 0358)  Hemodynamic parameters for last 24 hours:    Intake/Output from previous day: 10/20 0701 - 10/21 0700 In: 2792.6 [I.V.:827.6; NG/GT:1365; IV Piggyback:599.9] Out: 2102 [Urine:2100; Blood:2]  Intake/Output this shift: Total I/O In: 15 [I.V.:15] Out: -   Vent settings for last 24 hours: Vent Mode: PRVC FiO2 (%):  [40 %-50 %] 40 % Set Rate:  [20 bmp] 20 bmp Vt Set:  [580 mL] 580 mL PEEP:  [8 cmH20] 8 cmH20 Plateau Pressure:  [24 cmH20] 24 cmH20  Physical Exam:  General: alert and no respiratory distress Neuro: alert and F/C HEENT/Neck: trach-clean, intact Resp: few rhonchi CVS: RRR GI: soft, NT Extremities: edema 2+  Results for orders placed or performed during the hospital encounter of 12/26/20 (from the past 24 hour(s))  Glucose, capillary     Status: Abnormal   Collection Time: 01/08/21  8:40 AM  Result Value Ref Range   Glucose-Capillary 165 (H) 70 - 99 mg/dL  Glucose, capillary     Status: Abnormal   Collection Time: 01/08/21 11:26 AM   Result Value Ref Range   Glucose-Capillary 228 (H) 70 - 99 mg/dL  Glucose, capillary     Status: Abnormal   Collection Time: 01/08/21  3:35 PM  Result Value Ref Range   Glucose-Capillary 282 (H) 70 - 99 mg/dL  Glucose, capillary     Status: Abnormal   Collection Time: 01/08/21  7:29 PM  Result Value Ref Range   Glucose-Capillary 257 (H) 70 - 99 mg/dL  Glucose, capillary     Status: Abnormal   Collection Time: 01/08/21 11:15 PM  Result Value Ref Range   Glucose-Capillary 231 (H) 70 - 99 mg/dL  Glucose, capillary     Status: Abnormal  Collection Time: 01/09/21  3:19 AM  Result Value Ref Range   Glucose-Capillary 135 (H) 70 - 99 mg/dL  CBC     Status: Abnormal   Collection Time: 01/09/21  5:53 AM  Result Value Ref Range   WBC 12.8 (H) 4.0 - 10.5 K/uL   RBC 2.90 (L) 4.22 - 5.81 MIL/uL   Hemoglobin 9.0 (L) 13.0 - 17.0 g/dL   HCT 29.7 (L) 39.0 - 52.0 %   MCV 102.4 (H) 80.0 - 100.0 fL   MCH 31.0 26.0 - 34.0 pg   MCHC 30.3 30.0 - 36.0 g/dL   RDW 12.3 11.5 - 15.5 %   Platelets 436 (H) 150 - 400 K/uL   nRBC 0.0 0.0 - 0.2 %  Basic metabolic panel     Status: Abnormal   Collection Time: 01/09/21  5:53 AM  Result Value Ref Range   Sodium 147 (H) 135 - 145 mmol/L   Potassium 3.9 3.5 - 5.1 mmol/L   Chloride 108 98 - 111 mmol/L   CO2 31 22 - 32 mmol/L   Glucose, Bld 149 (H) 70 - 99 mg/dL   BUN 28 (H) 6 - 20 mg/dL   Creatinine, Ser 0.74 0.61 - 1.24 mg/dL   Calcium 8.8 (L) 8.9 - 10.3 mg/dL   GFR, Estimated >60 >60 mL/min   Anion gap 8 5 - 15    Assessment & Plan: Present on Admission:  TBI (traumatic brain injury)    LOS: 14 days   Additional comments:I reviewed the patient's new clinical lab test results. . [facility]  TBI/B SAH and SDH/ICC/L IVH near vertex/hemorrhage along splenium - D/W Dr. Reatha Armour on the unit, he plans F/C CTH at some point but does not feel hygroma will need drainage Acute hypoxic ventilator dependent respiratory failure - wean towards HTC Scalp hematoma CV  - dilt for AF RVR (off in NSR), appreciate Cardiology F/U. Neo for distributive shock off Multiple facial abrasions DM - A1c 8; SSI. Appreciate DM coordinator eval. HX colon cancer HX HTN - on home lisinopril HX R MCA CVA Substance abuse (opiates, cocaine, alcohol) - was on CIWA for alcohol withdrawal, now Dex and Klon ID - Vanc for MRSA pneumonia On Brilinta, h/o MCS stent and stroke - hold FEN - resume TF, lasix X 1 today, TF, hypernatremia so will add free water and a dose of metolazone VTE prophylaxis - SCDs, SQH started 10/9 per [place] - ICU, wean, TBI team therapies I spoke with his daughter at the bedside Critical Care Total Time*: 59 Minutes  Georganna Skeans, MD, MPH, FACS Trauma & General Surgery Use [email or web address] to contact on call provider  01/09/2021  *Care during the described time interval was provided by me. I have reviewed this patient's available data, including medical history, events of note, physical examination and test results as part of my evaluation.
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
## 2021-01-10 LAB — BASIC METABOLIC PANEL
Anion gap: 7 (ref 5–15)
BUN: 24 mg/dL — ABNORMAL HIGH (ref 6–20)
CO2: 33 mmol/L — ABNORMAL HIGH (ref 22–32)
Calcium: 9.2 mg/dL (ref 8.9–10.3)
Chloride: 101 mmol/L (ref 98–111)
Creatinine, Ser: 0.69 mg/dL (ref 0.61–1.24)
GFR, Estimated: >60 mL/min (ref >60)
Glucose, Bld: 184 mg/dL — ABNORMAL HIGH (ref 70–99)
Potassium: 3.5 mmol/L (ref 3.5–5.1)
Sodium: 141 mmol/L (ref 135–145)

## 2021-01-10 LAB — CBC
HCT: 30.9 % — ABNORMAL LOW (ref 39.0–52.0)
Hemoglobin: 9.6 g/dL — ABNORMAL LOW (ref 13.0–17.0)
MCH: 31.4 pg (ref 26.0–34.0)
MCHC: 31.1 g/dL (ref 30.0–36.0)
MCV: 101 fL — ABNORMAL HIGH (ref 80.0–100.0)
Platelets: 442 10*3/uL — ABNORMAL HIGH (ref 150–400)
RBC: 3.06 MIL/uL — ABNORMAL LOW (ref 4.22–5.81)
RDW: 12.4 % (ref 11.5–15.5)
WBC: 9.9 10*3/uL (ref 4.0–10.5)
nRBC: 0 % (ref 0.0–0.2)

## 2021-01-10 LAB — GLUCOSE, CAPILLARY
Glucose-Capillary: 153 mg/dL — ABNORMAL HIGH (ref 70–99)
Glucose-Capillary: 164 mg/dL — ABNORMAL HIGH (ref 70–99)
Glucose-Capillary: 170 mg/dL — ABNORMAL HIGH (ref 70–99)
Glucose-Capillary: 192 mg/dL — ABNORMAL HIGH (ref 70–99)
Glucose-Capillary: 207 mg/dL — ABNORMAL HIGH (ref 70–99)
Glucose-Capillary: 210 mg/dL — ABNORMAL HIGH (ref 70–99)

## 2021-01-10 MED ORDER — CLONAZEPAM 0.25 MG PO TBDP
0.2500 mg | ORAL_TABLET | Freq: Two times a day (BID) | ORAL | Status: AC
  Administered 2021-01-10 – 2021-01-11 (×3): 0.25 mg
  Filled 2021-01-10 (×3): qty 1

## 2021-01-10 MED ORDER — MORPHINE SULFATE (PF) 2 MG/ML IV SOLN: 2.0000 mg | INTRAVENOUS | Status: DC | PRN

## 2021-01-10 NOTE — Progress Notes (Signed)
Dr Kyla Balzarine rounding note reviewed. Telemetry reviewed, infrequent episodes of afib, overall low burden. He is getting lopressor 12.5mg  bid through feeding tube. No anticoag due to [facility] and SDH. We will continue to follow telemetry over the weekend, call with questions. High bp's at times but not overall consistent, follow trends for now, room to titrate lisinopril if needed  Carlyle Dolly MD

## 2021-01-10 NOTE — Progress Notes (Signed)
Trauma/Critical Care Follow Up Note  Subjective:    Overnight Issues:   Objective:  Vital signs for last 24 hours: Temp:  [98.3 F (36.8 C)-99 F (37.2 C)] 99 F (37.2 C) (10/22 1200) Pulse Rate:  [69-101] 78 (10/22 1144) Resp:  [16-23] 21 (10/22 1144) BP: (125-174)/(64-102) 135/73 (10/22 1100) SpO2:  [95 %-100 %] 95 % (10/22 1144) FiO2 (%):  [40 %] 40 % (10/22 1144)  Hemodynamic parameters for last 24 hours:    Intake/Output from previous day: 10/21 0701 - 10/22 0700 In: 2961.7 [I.V.:201.7; NG/GT:2360; IV Piggyback:400.1] Out: 3050 [Urine:2950; Stool:100]  Intake/Output this shift: Total I/O In: 493.7 [I.V.:168.7; NG/GT:325] Out: -   Vent settings for last 24 hours: Vent Mode: PSV;CPAP FiO2 (%):  [40 %] 40 % Set Rate:  [20 bmp] 20 bmp Vt Set:  [580 mL] 580 mL PEEP:  [5 cmH20-8 cmH20] 5 cmH20 Pressure Support:  [5 cmH20] 5 cmH20  Physical Exam:  Gen: comfortable, no distress Neuro: non-focal exam, limited conversation and does not f/c, but this seems volitional HEENT: PERRL Neck: supple CV: RRR Pulm: unlabored breathing Abd: soft, NT, distended, tympanitic GU: clear yellow urine Extr: wwp, 1+ edema   Results for orders placed or performed during the hospital encounter of 12/26/20 (from the past 24 hour(s))  Glucose, capillary     Status: Abnormal   Collection Time: 01/09/21  3:24 PM  Result Value Ref Range   Glucose-Capillary 179 (H) 70 - 99 mg/dL  Vancomycin, trough     Status: None   Collection Time: 01/09/21  3:30 PM  Result Value Ref Range   Vancomycin Tr 16 15 - 20 ug/mL  Glucose, capillary     Status: Abnormal   Collection Time: 01/09/21  7:35 PM  Result Value Ref Range   Glucose-Capillary 227 (H) 70 - 99 mg/dL  Glucose, capillary     Status: Abnormal   Collection Time: 01/09/21 11:21 PM  Result Value Ref Range   Glucose-Capillary 199 (H) 70 - 99 mg/dL  Glucose, capillary     Status: Abnormal   Collection Time: 01/10/21  3:30 AM  Result  Value Ref Range   Glucose-Capillary 153 (H) 70 - 99 mg/dL  CBC     Status: Abnormal   Collection Time: 01/10/21  5:23 AM  Result Value Ref Range   WBC 9.9 4.0 - 10.5 K/uL   RBC 3.06 (L) 4.22 - 5.81 MIL/uL   Hemoglobin 9.6 (L) 13.0 - 17.0 g/dL   HCT 30.9 (L) 39.0 - 52.0 %   MCV 101.0 (H) 80.0 - 100.0 fL   MCH 31.4 26.0 - 34.0 pg   MCHC 31.1 30.0 - 36.0 g/dL   RDW 12.4 11.5 - 15.5 %   Platelets 442 (H) 150 - 400 K/uL   nRBC 0.0 0.0 - 0.2 %  Basic metabolic panel     Status: Abnormal   Collection Time: 01/10/21  5:23 AM  Result Value Ref Range   Sodium 141 135 - 145 mmol/L   Potassium 3.5 3.5 - 5.1 mmol/L   Chloride 101 98 - 111 mmol/L   CO2 33 (H) 22 - 32 mmol/L   Glucose, Bld 184 (H) 70 - 99 mg/dL   BUN 24 (H) 6 - 20 mg/dL   Creatinine, Ser 0.69 0.61 - 1.24 mg/dL   Calcium 9.2 8.9 - 10.3 mg/dL   GFR, Estimated >60 >60 mL/min   Anion gap 7 5 - 15  Glucose, capillary     Status: Abnormal  Collection Time: 01/10/21  7:59 AM  Result Value Ref Range   Glucose-Capillary 210 (H) 70 - 99 mg/dL  Glucose, capillary     Status: Abnormal   Collection Time: 01/10/21 11:35 AM  Result Value Ref Range   Glucose-Capillary 192 (H) 70 - 99 mg/dL    Assessment & Plan:  Present on Admission:  TBI (traumatic brain injury)    LOS: 15 days   Additional comments:I reviewed the patient's new clinical lab test results.   and I reviewed the patients new imaging test results.    [facility]   TBI/B SAH and SDH/ICC/L IVH near vertex/hemorrhage along splenium - D/W Dr. Reatha Armour on the unit, he plans F/C CTH at some point but does not feel hygroma will need drainage Acute hypoxic ventilator dependent respiratory failure - TC trial today Scalp hematoma CV - dilt for AF RVR (off in NSR), appreciate Cardiology f/u Multiple facial abrasions DM - A1c 8; SSI. Appreciate DM coordinator eval. HX colon cancer HX HTN - on home lisinopril HX R MCA CVA Substance abuse (opiates, cocaine, alcohol) - was on CIWA  for alcohol withdrawal ID - Vanc for MRSA pneumonia On Brilinta, h/o MCS stent and stroke - hold FEN - resumed TF, hypernatremia resolved, monitor VTE prophylaxis - SCDs, SQH started 10/9 per [place] - ICU, wean, therapies  Critical Care Total Time: 35 minutes  Jesusita Oka, MD Trauma & General Surgery Please use [email or web address] to contact on call provider  01/10/2021  *Care during the described time interval was provided by me. I have reviewed this patient's available data, including medical history, events of note, physical examination and test results as part of my evaluation.

## 2021-01-11 ENCOUNTER — Inpatient Hospital Stay (HOSPITAL_COMMUNITY): Payer: Medicaid Other

## 2021-01-11 LAB — BASIC METABOLIC PANEL
Anion gap: 9 (ref 5–15)
BUN: 18 mg/dL (ref 6–20)
CO2: 31 mmol/L (ref 22–32)
Calcium: 9.2 mg/dL (ref 8.9–10.3)
Chloride: 97 mmol/L — ABNORMAL LOW (ref 98–111)
Creatinine, Ser: 0.71 mg/dL (ref 0.61–1.24)
GFR, Estimated: >60 mL/min (ref >60)
Glucose, Bld: 226 mg/dL — ABNORMAL HIGH (ref 70–99)
Potassium: 3.7 mmol/L (ref 3.5–5.1)
Sodium: 137 mmol/L (ref 135–145)

## 2021-01-11 LAB — GLUCOSE, CAPILLARY
Glucose-Capillary: 198 mg/dL — ABNORMAL HIGH (ref 70–99)
Glucose-Capillary: 204 mg/dL — ABNORMAL HIGH (ref 70–99)
Glucose-Capillary: 189 mg/dL — ABNORMAL HIGH (ref 70–99)
Glucose-Capillary: 161 mg/dL — ABNORMAL HIGH (ref 70–99)
Glucose-Capillary: 220 mg/dL — ABNORMAL HIGH (ref 70–99)
Glucose-Capillary: 227 mg/dL — ABNORMAL HIGH (ref 70–99)

## 2021-01-11 LAB — CBC
HCT: 32.3 % — ABNORMAL LOW (ref 39.0–52.0)
Hemoglobin: 10.1 g/dL — ABNORMAL LOW (ref 13.0–17.0)
MCH: 30.6 pg (ref 26.0–34.0)
MCHC: 31.3 g/dL (ref 30.0–36.0)
MCV: 97.9 fL (ref 80.0–100.0)
Platelets: 439 10*3/uL — ABNORMAL HIGH (ref 150–400)
RBC: 3.3 MIL/uL — ABNORMAL LOW (ref 4.22–5.81)
RDW: 11.9 % (ref 11.5–15.5)
WBC: 11.4 10*3/uL — ABNORMAL HIGH (ref 4.0–10.5)
nRBC: 0 % (ref 0.0–0.2)

## 2021-01-11 MED ORDER — ORAL CARE MOUTH RINSE
15.0000 mL | Freq: Two times a day (BID) | OROMUCOSAL | Status: DC
  Administered 2021-01-11 – 2021-01-16 (×12): 15 mL via OROMUCOSAL

## 2021-01-11 MED ORDER — CHLORHEXIDINE GLUCONATE 0.12 % MT SOLN
15.0000 mL | Freq: Two times a day (BID) | OROMUCOSAL | Status: DC
  Administered 2021-01-11 – 2021-01-17 (×12): 15 mL via OROMUCOSAL
  Filled 2021-01-11 (×11): qty 15
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
## 2021-01-11 NOTE — Progress Notes (Signed)
Tele reviewed, sinus rhythm without recurrent afib. He remains on lopressor 12.5mg  bid through his feeding tube which is controlling his rhythm, no anticoag given SAH/SDH.We will continue to monitor tele over the weekend.   Carlyle Dolly MD
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
## 2021-01-11 NOTE — Progress Notes (Signed)
Pt significantly more lethargic today compared to yesterday. Only following 2 commands all day, yesterday followed all commands consistently. Dr. Barry Dienes paged. Orders given for stat head CT.
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
## 2021-01-12 DIAGNOSIS — I480 Paroxysmal atrial fibrillation: Secondary | ICD-10-CM | POA: Diagnosis not present

## 2021-01-12 LAB — BASIC METABOLIC PANEL
Anion gap: 8 (ref 5–15)
BUN: 18 mg/dL (ref 6–20)
CO2: 31 mmol/L (ref 22–32)
Calcium: 9 mg/dL (ref 8.9–10.3)
Chloride: 98 mmol/L (ref 98–111)
Creatinine, Ser: 0.69 mg/dL (ref 0.61–1.24)
GFR, Estimated: >60 mL/min (ref >60)
Glucose, Bld: 213 mg/dL — ABNORMAL HIGH (ref 70–99)
Potassium: 3.7 mmol/L (ref 3.5–5.1)
Sodium: 137 mmol/L (ref 135–145)

## 2021-01-12 LAB — CBC
HCT: 31.4 % — ABNORMAL LOW (ref 39.0–52.0)
Hemoglobin: 10.3 g/dL — ABNORMAL LOW (ref 13.0–17.0)
MCH: 31.7 pg (ref 26.0–34.0)
MCHC: 32.8 g/dL (ref 30.0–36.0)
MCV: 96.6 fL (ref 80.0–100.0)
Platelets: 428 10*3/uL — ABNORMAL HIGH (ref 150–400)
RBC: 3.25 MIL/uL — ABNORMAL LOW (ref 4.22–5.81)
RDW: 11.9 % (ref 11.5–15.5)
WBC: 12.3 10*3/uL — ABNORMAL HIGH (ref 4.0–10.5)
nRBC: 0 % (ref 0.0–0.2)

## 2021-01-12 LAB — GLUCOSE, CAPILLARY
Glucose-Capillary: 169 mg/dL — ABNORMAL HIGH (ref 70–99)
Glucose-Capillary: 213 mg/dL — ABNORMAL HIGH (ref 70–99)
Glucose-Capillary: 214 mg/dL — ABNORMAL HIGH (ref 70–99)
Glucose-Capillary: 220 mg/dL — ABNORMAL HIGH (ref 70–99)
Glucose-Capillary: 229 mg/dL — ABNORMAL HIGH (ref 70–99)
Glucose-Capillary: 246 mg/dL — ABNORMAL HIGH (ref 70–99)

## 2021-01-12 MED ORDER — ENOXAPARIN SODIUM 40 MG/0.4ML IJ SOSY
40.0000 mg | PREFILLED_SYRINGE | Freq: Two times a day (BID) | INTRAMUSCULAR | Status: DC
  Administered 2021-01-12 – 2021-01-15 (×7): 40 mg via SUBCUTANEOUS
  Filled 2021-01-12 (×7): qty 0.4

## 2021-01-12 MED ORDER — QUETIAPINE FUMARATE 25 MG PO TABS
12.5000 mg | ORAL_TABLET | Freq: Every day | ORAL | Status: DC
  Administered 2021-01-12: 12.5 mg via NASOGASTRIC
  Filled 2021-01-12: qty 1

## 2021-01-12 MED ORDER — TICAGRELOR 90 MG PO TABS
90.0000 mg | ORAL_TABLET | Freq: Two times a day (BID) | ORAL | Status: DC
  Administered 2021-01-12 – 2021-01-14 (×6): 90 mg
  Filled 2021-01-12 (×8): qty 1

## 2021-01-12 MED ORDER — INSULIN ASPART 100 UNIT/ML IJ SOLN
8.0000 [IU] | INTRAMUSCULAR | Status: DC
  Administered 2021-01-12 – 2021-01-17 (×30): 8 [IU] via SUBCUTANEOUS

## 2021-01-12 NOTE — Progress Notes (Signed)
Inpatient Diabetes Program Recommendations  AACE/ADA: New Consensus Statement on Inpatient Glycemic Control (2015)  Target Ranges:  Prepandial:   less than 140 mg/dL      Peak postprandial:   less than 180 mg/dL (1-2 hours)      Critically ill patients:  140 - 180 mg/dL   Lab Results  Component Value Date   GLUCAP 246 (H) 01/12/2021   HGBA1C 8.0 (H) 12/26/2020    Review of Glycemic Control Results for Scott Day, Scott Day (MRN [id number]) as of 01/12/2021 10:59  Ref. Range 01/11/2021 19:53 01/11/2021 23:21 01/12/2021 03:45 01/12/2021 07:53  Glucose-Capillary Latest Ref Range: 70 - 99 mg/dL 220 (H) 227 (H) 169 (H) 246 (H)   Diabetes history: Type 2 DM Outpatient Diabetes medications: Metformin 250 mg BID Current orders for Inpatient glycemic control: Novolog 5 units Q4H, Novolog 0-15 units Q4H Pivot @65  ml/ hr   Inpatient Diabetes Program Recommendations:     Consider further increasing Novolog to 8 units Q4H for tube feed coverage (to be stopped or held in the event tube feeds stopped).   Thanks, Bronson Curb, MSN, RNC-OB Diabetes Coordinator [phone] (8a-5p)
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
## 2021-01-12 NOTE — Progress Notes (Signed)
Spoke with Dr. Bobbye Morton about removal of PICC. Order has been placed to remove PICC pending two peripheral IV access points achieved.
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
## 2021-01-12 NOTE — Progress Notes (Addendum)
Progress Note  Patient Name: Scott Day Date of Encounter: 01/12/2021  [facility] Cardiologist: Larae Grooms, MD  EP: Dr. Caryl Comes  Subjective   Opens eyes and tracks. Daughter at bedside. No AF over the weekend. He is getting PIV to remove PICC with the hold to decrease his infections risk.  Inpatient Medications    Scheduled Meds:  acetaminophen  1,000 mg Per Tube Q6H   chlorhexidine  15 mL Mouth Rinse BID   Chlorhexidine Gluconate Cloth  6 each Topical Daily   docusate  100 mg Per Tube BID   FLUoxetine  40 mg Per Tube Daily   folic acid  1 mg Per Tube Daily   free water  200 mL Per Tube Q8H   guaiFENesin  10 mL Per Tube Q6H   heparin injection (subcutaneous)  5,000 Units Subcutaneous Q8H   insulin aspart  0-15 Units Subcutaneous Q4H   insulin aspart  5 Units Subcutaneous Q4H   lisinopril  2.5 mg Per Tube Daily   mouth rinse  15 mL Mouth Rinse q12n4p   methocarbamol  1,000 mg Per Tube Q8H   metoprolol tartrate  12.5 mg Per Tube BID   multivitamin with minerals  1 tablet Per Tube Daily   pantoprazole sodium  40 mg Per Tube QHS   polyethylene glycol  17 g Per Tube Daily   QUEtiapine  25 mg Per NG tube QHS   rosuvastatin  20 mg Per Tube QHS   sodium chloride flush  10-40 mL Intracatheter Q12H   thiamine  100 mg Per Tube Daily   valproic acid  250 mg Per Tube BID   Continuous Infusions:  sodium chloride 10 mL/hr at 01/12/21 0800   feeding supplement (PIVOT 1.5 CAL) 1,000 mL (01/12/21 0311)   vancomycin Stopped (01/12/21 0537)   PRN Meds: albuterol, hydrALAZINE, metoprolol tartrate, metoprolol tartrate, morphine injection, ondansetron **OR** ondansetron (ZOFRAN) IV, oxyCODONE, QUEtiapine, sodium chloride flush   Vital Signs    Vitals:   01/12/21 0700 01/12/21 0800 01/12/21 0825 01/12/21 0921  BP: 137/66 (!) 142/68  (!) 152/70  Pulse: 90 92 92 93  Resp: (!) 24 (!) 23 (!) 22   Temp:  (!) 100.4 F (38 C)    TempSrc:  Axillary    SpO2: 93% 94% 95%    Weight:      Height:        Intake/Output Summary (Last 24 hours) at 01/12/2021 0931 Last data filed at 01/12/2021 0800 Gross per 24 hour  Intake 2980.36 ml  Output 4450 ml  Net -1469.64 ml   Last 3 Weights 01/12/2021 01/09/2021 01/08/2021  Weight (lbs) 268 lb 15.4 oz 268 lb 15.4 oz 276 lb 7.3 oz  Weight (kg) 122 kg 122 kg 125.4 kg      Telemetry    Brief AF 01/07/21; None over last three days, short runs of PACs - Personally Reviewed  ECG    No new tracings - Personally Reviewed  Physical Exam   GEN: Opens eyes and tracks Neck: No JVD Cardiac: RRR, no murmurs, rubs, or gallops; distan heart sounds Respiratory: trached, remains on vent, coarse breath sounds bilaterally GI: Soft, nontender, non-distended  MS: 1+ LE edema; No deformity. Neuro: Tracking but not following commands.  Psych: unable to assess  Labs    High Sensitivity Troponin:  No results for input(s): TROPONINIHS in the last 720 hours.   Chemistry Recent Labs  Lab 01/10/21 0523 01/11/21 0515 01/12/21 0453  NA 141 137 137  K 3.5 3.7 3.7  CL 101 97* 98  CO2 33* 31 31  GLUCOSE 184* 226* 213*  BUN 24* 18 18  CREATININE 0.69 0.71 0.69  CALCIUM 9.2 9.2 9.0  GFRNONAA >60 >60 >60  ANIONGAP 7 9 8     Lipids No results for input(s): CHOL, TRIG, HDL, LABVLDL, LDLCALC, CHOLHDL in the last 168 hours.  Hematology Recent Labs  Lab 01/10/21 0523 01/11/21 0515 01/12/21 0453  WBC 9.9 11.4* 12.3*  RBC 3.06* 3.30* 3.25*  HGB 9.6* 10.1* 10.3*  HCT 30.9* 32.3* 31.4*  MCV 101.0* 97.9 96.6  MCH 31.4 30.6 31.7  MCHC 31.1 31.3 32.8  RDW 12.4 11.9 11.9  PLT 442* 439* 428*   Thyroid No results for input(s): TSH, FREET4 in the last 168 hours.  BNPNo results for input(s): BNP, PROBNP in the last 168 hours.  DDimer No results for input(s): DDIMER in the last 168 hours.   Radiology    CT HEAD WO CONTRAST (5MM)  Result Date: 01/11/2021 CLINICAL DATA:  Mental status change which is persistent or worsening.  EXAM: CT HEAD WITHOUT CONTRAST TECHNIQUE: Contiguous axial images were obtained from the base of the skull through the vertex without intravenous contrast. COMPARISON:  01/04/2021 FINDINGS: Brain: No acute insult visible today. No focal abnormality seen affecting the brainstem or cerebellum. Previously seen intraventricular blood has partially resolved, only a small amount visible layering dependently in the occipital horns. Previously seen right-sided subdural hygroma is much smaller, maximal thickness 4 mm today compared with 1 cm previously. Ventricular size is stable. Old post traumatic encephalomalacia of the inferior frontal lobes and temporal tips. Old infarction in the right middle cerebral artery territory with cortical and subcortical chronic ischemic changes. No sign of acute or subacute infarction. Vascular: Right MCA stent as seen previously. There is atherosclerotic calcification of the major vessels at the base of the brain. Skull: No skull fracture. Sinuses/Orbits: Subtotal opacification of both sphenoid sinuses. Mucosal thickening of the maxillary sinuses right more than left. Orbits negative. Other: Some persistent but diminishing swelling of the left scalp. Small mastoid effusions. IMPRESSION: No new or acute finding. Diminishing right subdural hygroma, 4 mm thick today compared with 1 cm previously. Less mass effect. Diminishing blood dependent within the lateral ventricles. Small amount remains visual in the occipital horns. Chronic post traumatic changes of the inferior frontal lobes and temporal tips. Chronic right middle cerebral artery infarction. Right MCA stent as seen previously. Electronically Signed   By: Nelson Chimes M.D.   On: 01/11/2021 13:56    Cardiac Studies   None  Patient Profile     60 y.o. male with a PMH of HTN, DM type 2, CVA 05/2019 s/p ILR, anxiety, bipolar disorder, ETOH abuse, OSA, and colon cancer, who is being followed by cardiology for the evaluation of new  onset atrial fibrillation in the setting of TBI and trauma   Assessment & Plan    New onset paroxysmal atrial fibrillation:  History of stroke, HTN, and DM - CHADSVASC of 4; when OK for DDOAC - continue metoprolol 12.5 mg PO BID solution; and lisinopril 2.5 mg PO Daily, rosuvastatin 20 mg PO daily - if no return of AF will return Brilinta as outpatient if OK by primary team; has ILR - if return of AF and OK for DOAC, will start eliquis  Will need to arrange follow-up with Afib clinic or Dr. Caryl Comes closer to date of discharge.   Discussed with family who had no further questions  [facility] will sign off.   Medication Recommendations:  BB, ACEi, and antiplatelet strategy as above Other recommendations (labs, testing, etc):  NA Follow up as an outpatient:  has 02/02/21 follow up scheduled (see other MRN)      For questions or updates, please contact [facility] Please consult [email or web address] for contact info under        Signed, Werner Lean, MD  01/12/2021, 9:31 AM
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
## 2021-01-13 LAB — CBC
HCT: 31 % — ABNORMAL LOW (ref 39.0–52.0)
Hemoglobin: 10 g/dL — ABNORMAL LOW (ref 13.0–17.0)
MCH: 31.3 pg (ref 26.0–34.0)
MCHC: 32.3 g/dL (ref 30.0–36.0)
MCV: 97.2 fL (ref 80.0–100.0)
Platelets: 415 10*3/uL — ABNORMAL HIGH (ref 150–400)
RBC: 3.19 MIL/uL — ABNORMAL LOW (ref 4.22–5.81)
RDW: 11.9 % (ref 11.5–15.5)
WBC: 10.8 10*3/uL — ABNORMAL HIGH (ref 4.0–10.5)
nRBC: 0 % (ref 0.0–0.2)

## 2021-01-13 LAB — GLUCOSE, CAPILLARY
Glucose-Capillary: 155 mg/dL — ABNORMAL HIGH (ref 70–99)
Glucose-Capillary: 209 mg/dL — ABNORMAL HIGH (ref 70–99)
Glucose-Capillary: 241 mg/dL — ABNORMAL HIGH (ref 70–99)
Glucose-Capillary: 230 mg/dL — ABNORMAL HIGH (ref 70–99)
Glucose-Capillary: 183 mg/dL — ABNORMAL HIGH (ref 70–99)
Glucose-Capillary: 187 mg/dL — ABNORMAL HIGH (ref 70–99)

## 2021-01-13 LAB — URINALYSIS, ROUTINE W REFLEX MICROSCOPIC
Bilirubin Urine: NEGATIVE
Glucose, UA: 50 mg/dL — AB
Hgb urine dipstick: NEGATIVE
Ketones, ur: NEGATIVE mg/dL
Leukocytes,Ua: NEGATIVE
Nitrite: NEGATIVE
Protein, ur: NEGATIVE mg/dL
Specific Gravity, Urine: 1.025 (ref 1.005–1.030)
pH: 6 (ref 5.0–8.0)

## 2021-01-13 LAB — BASIC METABOLIC PANEL
Anion gap: 7 (ref 5–15)
BUN: 17 mg/dL (ref 6–20)
CO2: 29 mmol/L (ref 22–32)
Calcium: 9.1 mg/dL (ref 8.9–10.3)
Chloride: 102 mmol/L (ref 98–111)
Creatinine, Ser: 0.7 mg/dL (ref 0.61–1.24)
GFR, Estimated: >60 mL/min (ref >60)
Glucose, Bld: 151 mg/dL — ABNORMAL HIGH (ref 70–99)
Potassium: 3.8 mmol/L (ref 3.5–5.1)
Sodium: 138 mmol/L (ref 135–145)

## 2021-01-13 MED ORDER — NUTRISOURCE FIBER PO PACK
1.0000 | PACK | Freq: Two times a day (BID) | ORAL | Status: DC
  Administered 2021-01-13 – 2021-01-17 (×9): 1
  Filled 2021-01-13 (×9): qty 1

## 2021-01-13 MED ORDER — QUETIAPINE 12.5 MG HALF TABLET
12.5000 mg | ORAL_TABLET | Freq: Every evening | ORAL | Status: DC | PRN
  Administered 2021-01-14 – 2021-01-15 (×3): 12.5 mg
  Filled 2021-01-13 (×4): qty 1

## 2021-01-13 NOTE — Progress Notes (Signed)
Inpatient Rehab Admissions Coordinator:   Met with patient and his daughter at the bedside.  Pt following commands intermittently and with some delay.  Purposeful movement noted.  Pt has been on CIR in the past (07/2019 per daughter, with Dr. Posey Pronto).  They are hopeful for CIR admission when ready.  Family will provide 24/7 assist.  Let her know that FiO2 needs to be 35% or less to be considered.  I will start insurance authorization today.   Shann Medal, PT, DPT Admissions Coordinator [phone] 01/13/21  1:28 PM

## 2021-01-13 NOTE — Progress Notes (Addendum)
Nutrition Follow-up  DOCUMENTATION CODES:   Not applicable  INTERVENTION:   CBG's elevated recommend adjusting insulin   Tube feeding via Cortrak tube: Pivot 1.5 at 65 ml/h (1560 ml per day)  Provides 2340 kcal, 146 gm protein, 1184 ml free water daily  1 packet nutrisource fiber BID per tube   200 ml free water every 8 hours  Total free water: 1784 ml    NUTRITION DIAGNOSIS:   Increased nutrient needs related to  (TBI) as evidenced by estimated needs. Ongoing.   GOAL:   Patient will meet greater than or equal to 90% of their needs Met with TF.   MONITOR:   Vent status, TF tolerance  REASON FOR ASSESSMENT:   Consult Enteral/tube feeding initiation and management  ASSESSMENT:   Pt with PMH of DM, colon ca, R MCA CVA now admitted after [facility] with TBI, B SAH, ICC, scalp hematoma, and multiple facial abrasions.     Pt discussed during ICU rounds and with RN.  Per trauma pt positive for opiates and cocaine on admission.  Pt has remained on trach collar since 10/22. Working with TBI team. Per TBI team diaphoretic EOB, recommend CIR. Pt with strong R gaze. Daughter at bedside.  Weight on admission 251 lb now 237 lb  10/7 intubated 10/12 Cortrak placed; tip gastric  10/17 extubated 10/19 intubated 10/20 trach placed   Medications reviewed and include: colace, folic acid, SSI, novolog, MVI with minerals, miralax, thiamine   Labs reviewed CBG's: 155-214  UOP: 3400 ml  I&O: +13 L   Diet Order:   Diet Order             Diet NPO time specified  Diet effective now                   EDUCATION NEEDS:   Not appropriate for education at this time  Skin:  Skin Assessment: Reviewed RN Assessment  Last BM:  255 ml via rectal tube  Height:   Ht Readings from Last 1 Encounters:  12/30/20 _0  (1.778 m)    Weight:   Wt Readings from Last 1 Encounters:  01/13/21 107.9 kg    BMI:  Body mass index is 34.13 kg/m.  Estimated Nutritional Needs:    Kcal:  2100-2300  Protein:  120-140 grams  Fluid:  >2 L/day  Lockie Pares., RD, LDN, CNSC See AMiON for contact information
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
## 2021-01-14 LAB — GLUCOSE, CAPILLARY
Glucose-Capillary: 194 mg/dL — ABNORMAL HIGH (ref 70–99)
Glucose-Capillary: 193 mg/dL — ABNORMAL HIGH (ref 70–99)
Glucose-Capillary: 219 mg/dL — ABNORMAL HIGH (ref 70–99)
Glucose-Capillary: 163 mg/dL — ABNORMAL HIGH (ref 70–99)
Glucose-Capillary: 195 mg/dL — ABNORMAL HIGH (ref 70–99)

## 2021-01-14 MED ORDER — OXYCODONE HCL 5 MG/5ML PO SOLN: 2.5000 mg | ORAL | Status: DC | PRN

## 2021-01-14 NOTE — Progress Notes (Signed)
Patient's flexiseal became dislodged. Family present at bedside suspects patient "pushed" it out. Flexiseal found on bed under patient with balloon fully inflated. Ok per Dr Rosendo Gros to leave out at this time.
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
## 2021-01-14 NOTE — Progress Notes (Signed)
Inpatient Rehab Admissions Coordinator:   I have insurance approval for CIR but no bed available today.  Will continue to follow for timing of potential admission.    Shann Medal, PT, DPT Admissions Coordinator [phone] 01/14/21  2:30 PM
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
## 2021-01-15 DIAGNOSIS — I480 Paroxysmal atrial fibrillation: Secondary | ICD-10-CM | POA: Diagnosis not present

## 2021-01-15 LAB — GLUCOSE, CAPILLARY
Glucose-Capillary: 180 mg/dL — ABNORMAL HIGH (ref 70–99)
Glucose-Capillary: 185 mg/dL — ABNORMAL HIGH (ref 70–99)
Glucose-Capillary: 201 mg/dL — ABNORMAL HIGH (ref 70–99)
Glucose-Capillary: 207 mg/dL — ABNORMAL HIGH (ref 70–99)
Glucose-Capillary: 217 mg/dL — ABNORMAL HIGH (ref 70–99)
Glucose-Capillary: 228 mg/dL — ABNORMAL HIGH (ref 70–99)

## 2021-01-15 MED ORDER — APIXABAN 5 MG PO TABS
5.0000 mg | ORAL_TABLET | Freq: Two times a day (BID) | ORAL | Status: DC
  Administered 2021-01-15 – 2021-01-17 (×5): 5 mg via ORAL
  Filled 2021-01-15 (×5): qty 1

## 2021-01-15 MED ORDER — METOPROLOL TARTRATE 25 MG/10 ML ORAL SUSPENSION
25.0000 mg | Freq: Two times a day (BID) | ORAL | Status: DC
  Administered 2021-01-15: 25 mg
  Administered 2021-01-15: 12.5 mg
  Administered 2021-01-16 – 2021-01-17 (×3): 25 mg
  Filled 2021-01-15 (×5): qty 10

## 2021-01-15 MED ORDER — DILTIAZEM HCL-DEXTROSE 125-5 MG/125ML-% IV SOLN (PREMIX)
5.0000 mg/h | INTRAVENOUS | Status: DC
Start: 2021-01-15 — End: 2021-01-15
  Administered 2021-01-15: 5 mg/h via INTRAVENOUS
  Filled 2021-01-15: qty 125

## 2021-01-15 NOTE — Progress Notes (Signed)
Progress Note  Patient Name: Scott Day Date of Encounter: 01/15/2021  [facility] Cardiologist: Larae Grooms, MD  EP: Dr. Caryl Comes  Subjective   Opens eyes and tracks. Daughter at bedside. Called back for PM AF RVR  Inpatient Medications    Scheduled Meds:  acetaminophen  1,000 mg Per Tube Q6H   apixaban  5 mg Oral BID   chlorhexidine  15 mL Mouth Rinse BID   Chlorhexidine Gluconate Cloth  6 each Topical Daily   docusate  100 mg Per Tube BID   fiber  1 packet Per Tube BID   FLUoxetine  40 mg Per Tube Daily   folic acid  1 mg Per Tube Daily   free water  200 mL Per Tube Q8H   guaiFENesin  10 mL Per Tube Q6H   insulin aspart  0-15 Units Subcutaneous Q4H   insulin aspart  8 Units Subcutaneous Q4H   lisinopril  2.5 mg Per Tube Daily   mouth rinse  15 mL Mouth Rinse q12n4p   methocarbamol  1,000 mg Per Tube Q8H   metoprolol tartrate  25 mg Per Tube BID   multivitamin with minerals  1 tablet Per Tube Daily   polyethylene glycol  17 g Per Tube Daily   rosuvastatin  20 mg Per Tube QHS   thiamine  100 mg Per Tube Daily   valproic acid  250 mg Per Tube BID   Continuous Infusions:  sodium chloride Stopped (01/12/21 0939)   feeding supplement (PIVOT 1.5 CAL) 1,000 mL (01/15/21 0452)   PRN Meds: albuterol, hydrALAZINE, metoprolol tartrate, metoprolol tartrate, morphine injection, ondansetron **OR** ondansetron (ZOFRAN) IV, oxyCODONE, QUEtiapine   Vital Signs    Vitals:   01/15/21 0809 01/15/21 0828 01/15/21 1132 01/15/21 1200  BP: (!) 149/75 (!) 149/75 136/72 136/72  Pulse: 86 84 81 77  Resp: 16 (!) 24 19 (!) 22  Temp: 98.4 F (36.9 C)  98.1 F (36.7 C)   TempSrc: Axillary  Axillary   SpO2: 95% 96% 91% 96%  Weight:      Height:        Intake/Output Summary (Last 24 hours) at 01/15/2021 1222 Last data filed at 01/15/2021 1149 Gross per 24 hour  Intake 48.28 ml  Output --  Net 48.28 ml   Last 3 Weights 01/15/2021 01/13/2021 01/12/2021  Weight  (lbs) 225 lb 15.5 oz 237 lb 14 oz 268 lb 15.4 oz  Weight (kg) 102.5 kg 107.9 kg 122 kg      Telemetry    Brief AF RVR Personally Reviewed  ECG    No new tracings - Personally Reviewed  Physical Exam   GEN: Opens eyes and tracks Neck: No JVD Cardiac: RRR, no murmurs, rubs, or gallops; distan heart sounds Respiratory: trached, remains on vent, coarse breath sounds bilaterally GI: Soft, nontender, non-distended  MS: 1+ LE edema; No deformity. Neuro: Tracking but somnolent Psych: unable to assess  Labs    High Sensitivity Troponin:  No results for input(s): TROPONINIHS in the last 720 hours.   Chemistry Recent Labs  Lab 01/11/21 0515 01/12/21 0453 01/13/21 0322  NA 137 137 138  K 3.7 3.7 3.8  CL 97* 98 102  CO2 31 31 29   GLUCOSE 226* 213* 151*  BUN 18 18 17   CREATININE 0.71 0.69 0.70  CALCIUM 9.2 9.0 9.1  GFRNONAA >60 >60 >60  ANIONGAP 9 8 7     Lipids No results for input(s): CHOL, TRIG, HDL, LABVLDL, LDLCALC, CHOLHDL in the  last 168 hours.  Hematology Recent Labs  Lab 01/11/21 0515 01/12/21 0453 01/13/21 0322  WBC 11.4* 12.3* 10.8*  RBC 3.30* 3.25* 3.19*  HGB 10.1* 10.3* 10.0*  HCT 32.3* 31.4* 31.0*  MCV 97.9 96.6 97.2  MCH 30.6 31.7 31.3  MCHC 31.3 32.8 32.3  RDW 11.9 11.9 11.9  PLT 439* 428* 415*   Thyroid No results for input(s): TSH, FREET4 in the last 168 hours.  BNPNo results for input(s): BNP, PROBNP in the last 168 hours.  DDimer No results for input(s): DDIMER in the last 168 hours.   Radiology    No results found.  Cardiac Studies   None  Patient Profile     60 y.o. male with a PMH of HTN, DM type 2, CVA 05/2019 s/p ILR, anxiety, bipolar disorder, ETOH abuse, OSA, and colon cancer, who is being followed by cardiology for the evaluation of new onset atrial fibrillation in the setting of TBI and trauma   Assessment & Plan    New onset paroxysmal atrial fibrillation:  History of stroke, HTN, and DM - CHADSVASC of 4; now on eliquis -  agree with metoprolol increase to 25 mg PO BID solution; and lisinopril 2.5 mg PO Daily, rosuvastatin 20 mg PO daily - if further RVR episodes, he is now on [facility]; we would trial oral amiodarone load 400 mg PO BID for one week then 200 mg PO daily  Will need to arrange follow-up with Afib clinic or Dr. Caryl Comes closer to date of discharge.      For questions or updates, please contact [place] Please consult [email or web address] for contact info under        Signed, Werner Lean, MD  01/15/2021, 12:22 PM

## 2021-01-15 NOTE — Progress Notes (Signed)
Patient converted into a fib at 0116. With HR ranging from 120s -160s. Metoprolol 10 mg administered. Will continue to monitor.

## 2021-01-15 NOTE — Plan of Care (Signed)
  Problem: Education: Goal: Knowledge of the prescribed therapeutic regimen Outcome: Progressing Goal: Knowledge of disease or condition will improve Outcome: Progressing   Problem: Clinical Measurements: Goal: Neurologic status will improve Outcome: Progressing   Problem: Tissue Perfusion: Goal: Ability to maintain intracranial pressure will improve Outcome: Progressing   Problem: Respiratory: Goal: Will regain and/or maintain adequate ventilation Outcome: Progressing   Problem: Skin Integrity: Goal: Risk for impaired skin integrity will decrease Outcome: Progressing Goal: Demonstration of wound healing without infection will improve Outcome: Progressing   Problem: Psychosocial: Goal: Ability to verbalize positive feelings about self will improve Outcome: Progressing Goal: Ability to participate in self-care as condition permits will improve Outcome: Progressing Goal: Ability to identify appropriate support needs will improve Outcome: Progressing   Problem: Health Behavior/Discharge Planning: Goal: Ability to manage health-related needs will improve Outcome: Progressing   Problem: Nutritional: Goal: Risk of aspiration will decrease Outcome: Progressing Goal: Dietary intake will improve Outcome: Progressing   Problem: Communication: Goal: Ability to communicate needs accurately will improve Outcome: Progressing   Problem: Education: Goal: Knowledge of General Education information will improve Description: Including pain rating scale, medication(s)/side effects and non-pharmacologic comfort measures Outcome: Progressing   Problem: Health Behavior/Discharge Planning: Goal: Ability to manage health-related needs will improve Outcome: Progressing   Problem: Clinical Measurements: Goal: Ability to maintain clinical measurements within normal limits will improve Outcome: Progressing Goal: Will remain free from infection Outcome: Progressing Goal: Diagnostic test  results will improve Outcome: Progressing Goal: Respiratory complications will improve Outcome: Progressing Goal: Cardiovascular complication will be avoided Outcome: Progressing   Problem: Activity: Goal: Risk for activity intolerance will decrease Outcome: Progressing   Problem: Nutrition: Goal: Adequate nutrition will be maintained Outcome: Progressing   Problem: Coping: Goal: Level of anxiety will decrease Outcome: Progressing   Problem: Elimination: Goal: Will not experience complications related to bowel motility Outcome: Progressing Goal: Will not experience complications related to urinary retention Outcome: Progressing   Problem: Pain Managment: Goal: General experience of comfort will improve Outcome: Progressing   Problem: Safety: Goal: Ability to remain free from injury will improve Outcome: Progressing

## 2021-01-15 NOTE — Discharge Instructions (Signed)
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
## 2021-01-15 NOTE — Progress Notes (Signed)
Inpatient Rehab Admissions Coordinator:   Met with patient at his daughter at bedside.  Pt alert and interactive, waves in response to my greeting and able to track to the L of midline with mod multimodal cues.  Following commands with some occasional delay.  Note currently in NSR so will plan for possible transition to CIR when bed available (not today).    Shann Medal, PT, DPT Admissions Coordinator [phone] 01/15/21  1:57 PM

## 2021-01-15 NOTE — Procedures (Signed)
Tracheostomy Change Note  Patient Details:   Name: Scott Day DOB: 07-13-1960 MRN: [id number]    Airway Documentation:     Evaluation  O2 sats: stable throughout Complications: No apparent complications Patient did tolerate procedure well. Bilateral Breath Sounds: Rhonchi, Diminished  Pt trach downsized to #6 shiley XLT promical cuffless.    Tobi Bastos 01/15/2021, 2:28 PM

## 2021-01-16 DIAGNOSIS — I480 Paroxysmal atrial fibrillation: Secondary | ICD-10-CM | POA: Diagnosis not present

## 2021-01-16 LAB — GLUCOSE, CAPILLARY
Glucose-Capillary: 165 mg/dL — ABNORMAL HIGH (ref 70–99)
Glucose-Capillary: 172 mg/dL — ABNORMAL HIGH (ref 70–99)
Glucose-Capillary: 210 mg/dL — ABNORMAL HIGH (ref 70–99)
Glucose-Capillary: 219 mg/dL — ABNORMAL HIGH (ref 70–99)
Glucose-Capillary: 224 mg/dL — ABNORMAL HIGH (ref 70–99)
Glucose-Capillary: 228 mg/dL — ABNORMAL HIGH (ref 70–99)
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
## 2021-01-16 NOTE — Progress Notes (Signed)
Progress Note  Patient Name: Scott Day Date of Encounter: 01/16/2021  [facility] Cardiologist: Larae Grooms, MD  EP: Dr. Caryl Comes  Subjective   Talking with speaking valve.  Feels the best he has felt since this admission.  Inpatient Medications    Scheduled Meds:  acetaminophen  1,000 mg Per Tube Q6H   apixaban  5 mg Oral BID   chlorhexidine  15 mL Mouth Rinse BID   Chlorhexidine Gluconate Cloth  6 each Topical Daily   docusate  100 mg Per Tube BID   fiber  1 packet Per Tube BID   FLUoxetine  40 mg Per Tube Daily   folic acid  1 mg Per Tube Daily   free water  200 mL Per Tube Q8H   guaiFENesin  10 mL Per Tube Q6H   insulin aspart  0-15 Units Subcutaneous Q4H   insulin aspart  8 Units Subcutaneous Q4H   lisinopril  2.5 mg Per Tube Daily   mouth rinse  15 mL Mouth Rinse q12n4p   methocarbamol  1,000 mg Per Tube Q8H   metoprolol tartrate  25 mg Per Tube BID   multivitamin with minerals  1 tablet Per Tube Daily   polyethylene glycol  17 g Per Tube Daily   rosuvastatin  20 mg Per Tube QHS   thiamine  100 mg Per Tube Daily   valproic acid  250 mg Per Tube BID   Continuous Infusions:  sodium chloride Stopped (01/12/21 0939)   feeding supplement (PIVOT 1.5 CAL) 1,000 mL (01/15/21 2251)   PRN Meds: albuterol, hydrALAZINE, metoprolol tartrate, metoprolol tartrate, morphine injection, ondansetron **OR** ondansetron (ZOFRAN) IV, oxyCODONE, QUEtiapine   Vital Signs    Vitals:   01/16/21 0445 01/16/21 0520 01/16/21 0747 01/16/21 0814  BP: (!) 148/87  (!) 146/84 (!) 146/84  Pulse: 96  94 88  Resp: 20  (!) 23 (!) 21  Temp: 98.3 F (36.8 C)  98.1 F (36.7 C)   TempSrc: Oral  Oral   SpO2: 91%  95% 97%  Weight:  102 kg    Height:        Intake/Output Summary (Last 24 hours) at 01/16/2021 1149 Last data filed at 01/15/2021 1603 Gross per 24 hour  Intake 671.17 ml  Output --  Net 671.17 ml   Last 3 Weights 01/16/2021 01/15/2021 01/13/2021  Weight  (lbs) 224 lb 13.9 oz 225 lb 15.5 oz 237 lb 14 oz  Weight (kg) 102 kg 102.5 kg 107.9 kg      Telemetry    No further RVR Personally Reviewed  ECG    No new tracings - Personally Reviewed  Physical Exam   GEN: pleasant and conversant Neck: No JVD Cardiac: RRR, no murmurs, rubs, or gallops; distant heart sounds Respiratory: trached, Coarse breath sounds GI: Soft, nontender, non-distended  MS: non pitting edema, left hand well dressed Neuro: conversant and oriented Psych: pleasant mood  Labs    High Sensitivity Troponin:  No results for input(s): TROPONINIHS in the last 720 hours.   Chemistry Recent Labs  Lab 01/11/21 0515 01/12/21 0453 01/13/21 0322  NA 137 137 138  K 3.7 3.7 3.8  CL 97* 98 102  CO2 31 31 29   GLUCOSE 226* 213* 151*  BUN 18 18 17   CREATININE 0.71 0.69 0.70  CALCIUM 9.2 9.0 9.1  GFRNONAA >60 >60 >60  ANIONGAP 9 8 7     Lipids No results for input(s): CHOL, TRIG, HDL, LABVLDL, LDLCALC, CHOLHDL in the last  168 hours.  Hematology Recent Labs  Lab 01/11/21 0515 01/12/21 0453 01/13/21 0322  WBC 11.4* 12.3* 10.8*  RBC 3.30* 3.25* 3.19*  HGB 10.1* 10.3* 10.0*  HCT 32.3* 31.4* 31.0*  MCV 97.9 96.6 97.2  MCH 30.6 31.7 31.3  MCHC 31.3 32.8 32.3  RDW 11.9 11.9 11.9  PLT 439* 428* 415*   Thyroid No results for input(s): TSH, FREET4 in the last 168 hours.  BNPNo results for input(s): BNP, PROBNP in the last 168 hours.  DDimer No results for input(s): DDIMER in the last 168 hours.   Radiology    No results found.  Cardiac Studies   None  Patient Profile     60 y.o. male with a PMH of HTN, DM type 2, CVA 05/2019 s/p ILR, anxiety, bipolar disorder, ETOH abuse, OSA, and colon cancer, who is being followed by cardiology for the evaluation of new onset atrial fibrillation in the setting of TBI and trauma   Assessment & Plan    New onset paroxysmal atrial fibrillation:  History of stroke, HTN, and DM - CHADSVASC of 4; now on eliquis - agree with  metoprolol  25 mg PO BID solution; and lisinopril 2.5 mg PO Daily, rosuvastatin 20 mg PO daily - if further RVR episodes, he is now on [facility]; we would trial oral amiodarone load 400 mg PO BID for one week then 200 mg PO daily  Has 02/02/21 follow up, his other MRN will need to be merged to this at discharge    No cardiac barriers to CIR admit 01/17/21  For questions or updates, please contact [place] Please consult [email or web address] for contact info under        Signed, Werner Lean, MD  01/16/2021, 11:49 AM

## 2021-01-16 NOTE — Progress Notes (Signed)
Inpatient Rehab Admissions Coordinator:   I have a bed for this pt to admit to CIR on Saturday (10/29).  Dr. Bobbye Morton in agreement.  Rehab MD (Dr. Dagoberto Ligas) to assess pt and confirm admission on Saturday.  Floor RN can call CIR at [phone] for report after 12pm on Saturday.  I have let pt/family and case manager know.    Shann Medal, PT, DPT Admissions Coordinator [phone] 01/16/21  9:54 AM
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
## 2021-01-17 ENCOUNTER — Encounter: Payer: Self-pay | Admitting: Adult Health

## 2021-01-17 ENCOUNTER — Inpatient Hospital Stay (HOSPITAL_COMMUNITY)
Admission: RE | Admit: 2021-01-17 | Discharge: 2021-02-10 | DRG: 945 | Disposition: A | Discharge status: Home / Self Care | Payer: Medicaid Other | Source: Intra-hospital | Attending: Physical Medicine & Rehabilitation | Admitting: Physical Medicine & Rehabilitation

## 2021-01-17 ENCOUNTER — Encounter (HOSPITAL_COMMUNITY): Payer: Self-pay | Admitting: Physical Medicine & Rehabilitation

## 2021-01-17 ENCOUNTER — Other Ambulatory Visit: Payer: Self-pay

## 2021-01-17 DIAGNOSIS — Z79899 Other long term (current) drug therapy: Secondary | ICD-10-CM

## 2021-01-17 DIAGNOSIS — S066XAD Traumatic subarachnoid hemorrhage with loss of consciousness status unknown, subsequent encounter: Secondary | ICD-10-CM | POA: Diagnosis not present

## 2021-01-17 DIAGNOSIS — Z8673 Personal history of transient ischemic attack (TIA), and cerebral infarction without residual deficits: Secondary | ICD-10-CM | POA: Diagnosis not present

## 2021-01-17 DIAGNOSIS — E119 Type 2 diabetes mellitus without complications: Secondary | ICD-10-CM | POA: Diagnosis present

## 2021-01-17 DIAGNOSIS — R1312 Dysphagia, oropharyngeal phase: Secondary | ICD-10-CM | POA: Diagnosis not present

## 2021-01-17 DIAGNOSIS — S069X2D Unspecified intracranial injury with loss of consciousness of 31 minutes to 59 minutes, subsequent encounter: Secondary | ICD-10-CM | POA: Diagnosis not present

## 2021-01-17 DIAGNOSIS — R3915 Urgency of urination: Secondary | ICD-10-CM | POA: Diagnosis not present

## 2021-01-17 DIAGNOSIS — I4891 Unspecified atrial fibrillation: Secondary | ICD-10-CM | POA: Diagnosis present

## 2021-01-17 DIAGNOSIS — E785 Hyperlipidemia, unspecified: Secondary | ICD-10-CM | POA: Diagnosis present

## 2021-01-17 DIAGNOSIS — R7309 Other abnormal glucose: Secondary | ICD-10-CM | POA: Diagnosis not present

## 2021-01-17 DIAGNOSIS — I469 Cardiac arrest, cause unspecified: Secondary | ICD-10-CM | POA: Diagnosis present

## 2021-01-17 DIAGNOSIS — Z7984 Long term (current) use of oral hypoglycemic drugs: Secondary | ICD-10-CM | POA: Diagnosis not present

## 2021-01-17 DIAGNOSIS — F1010 Alcohol abuse, uncomplicated: Secondary | ICD-10-CM | POA: Diagnosis present

## 2021-01-17 DIAGNOSIS — Z781 Physical restraint status: Secondary | ICD-10-CM | POA: Diagnosis not present

## 2021-01-17 DIAGNOSIS — Z930 Tracheostomy status: Secondary | ICD-10-CM | POA: Diagnosis not present

## 2021-01-17 DIAGNOSIS — I10 Essential (primary) hypertension: Secondary | ICD-10-CM | POA: Diagnosis present

## 2021-01-17 DIAGNOSIS — Z7902 Long term (current) use of antithrombotics/antiplatelets: Secondary | ICD-10-CM | POA: Diagnosis not present

## 2021-01-17 DIAGNOSIS — I639 Cerebral infarction, unspecified: Secondary | ICD-10-CM | POA: Diagnosis not present

## 2021-01-17 DIAGNOSIS — F1910 Other psychoactive substance abuse, uncomplicated: Secondary | ICD-10-CM | POA: Diagnosis not present

## 2021-01-17 DIAGNOSIS — R1310 Dysphagia, unspecified: Secondary | ICD-10-CM | POA: Diagnosis present

## 2021-01-17 DIAGNOSIS — S065XAD Traumatic subdural hemorrhage with loss of consciousness status unknown, subsequent encounter: Principal | ICD-10-CM

## 2021-01-17 DIAGNOSIS — E871 Hypo-osmolality and hyponatremia: Secondary | ICD-10-CM | POA: Diagnosis not present

## 2021-01-17 DIAGNOSIS — E1165 Type 2 diabetes mellitus with hyperglycemia: Secondary | ICD-10-CM | POA: Diagnosis present

## 2021-01-17 DIAGNOSIS — F319 Bipolar disorder, unspecified: Secondary | ICD-10-CM | POA: Diagnosis present

## 2021-01-17 DIAGNOSIS — D62 Acute posthemorrhagic anemia: Secondary | ICD-10-CM | POA: Diagnosis present

## 2021-01-17 DIAGNOSIS — S069XAA Unspecified intracranial injury with loss of consciousness status unknown, initial encounter: Secondary | ICD-10-CM

## 2021-01-17 DIAGNOSIS — Z85038 Personal history of other malignant neoplasm of large intestine: Secondary | ICD-10-CM | POA: Diagnosis not present

## 2021-01-17 DIAGNOSIS — S069X2S Unspecified intracranial injury with loss of consciousness of 31 minutes to 59 minutes, sequela: Secondary | ICD-10-CM | POA: Diagnosis not present

## 2021-01-17 LAB — GLUCOSE, CAPILLARY
Glucose-Capillary: 130 mg/dL — ABNORMAL HIGH (ref 70–99)
Glucose-Capillary: 164 mg/dL — ABNORMAL HIGH (ref 70–99)
Glucose-Capillary: 180 mg/dL — ABNORMAL HIGH (ref 70–99)
Glucose-Capillary: 182 mg/dL — ABNORMAL HIGH (ref 70–99)
Glucose-Capillary: 184 mg/dL — ABNORMAL HIGH (ref 70–99)
Glucose-Capillary: 225 mg/dL — ABNORMAL HIGH (ref 70–99)
Glucose-Capillary: 259 mg/dL — ABNORMAL HIGH (ref 70–99)

## 2021-01-17 MED ORDER — ONDANSETRON 4 MG PO TBDP
4.0000 mg | ORAL_TABLET | Freq: Four times a day (QID) | ORAL | Status: DC | PRN
  Administered 2021-01-26 – 2021-01-27 (×2): 4 mg via ORAL
  Filled 2021-01-17 (×2): qty 1

## 2021-01-17 MED ORDER — DOCUSATE SODIUM 50 MG/5ML PO LIQD
100.0000 mg | Freq: Two times a day (BID) | ORAL | Status: DC
  Administered 2021-01-17 – 2021-01-19 (×3): 100 mg
  Filled 2021-01-17 (×4): qty 10

## 2021-01-17 MED ORDER — ACETAMINOPHEN 325 MG PO TABS
325.0000 mg | ORAL_TABLET | ORAL | Status: DC | PRN
  Administered 2021-01-27: 650 mg
  Filled 2021-01-17: qty 2

## 2021-01-17 MED ORDER — NUTRISOURCE FIBER PO PACK
1.0000 | PACK | Freq: Two times a day (BID) | ORAL | Status: DC
  Administered 2021-01-17 – 2021-01-29 (×23): 1
  Filled 2021-01-17 (×24): qty 1

## 2021-01-17 MED ORDER — POLYETHYLENE GLYCOL 3350 17 G PO PACK
17.0000 g | PACK | Freq: Every day | ORAL | Status: DC
  Administered 2021-01-17 – 2021-01-19 (×3): 17 g
  Filled 2021-01-17 (×4): qty 1

## 2021-01-17 MED ORDER — APIXABAN 5 MG PO TABS
5.0000 mg | ORAL_TABLET | Freq: Two times a day (BID) | ORAL | Status: DC
  Administered 2021-01-17 – 2021-01-29 (×24): 5 mg
  Filled 2021-01-17 (×24): qty 1

## 2021-01-17 MED ORDER — GUAIFENESIN 100 MG/5ML PO LIQD
10.0000 mL | Freq: Four times a day (QID) | ORAL | Status: DC
  Administered 2021-01-17 – 2021-01-29 (×46): 10 mL
  Filled 2021-01-17 (×45): qty 10

## 2021-01-17 MED ORDER — PIVOT 1.5 CAL PO LIQD
1000.0000 mL | ORAL | Status: DC
  Administered 2021-01-17: 1000 mL
  Filled 2021-01-17 (×4): qty 1000

## 2021-01-17 MED ORDER — METHOCARBAMOL 500 MG PO TABS
1000.0000 mg | ORAL_TABLET | Freq: Three times a day (TID) | ORAL | Status: DC
  Administered 2021-01-17 – 2021-01-29 (×36): 1000 mg
  Filled 2021-01-17 (×36): qty 2

## 2021-01-17 MED ORDER — ONDANSETRON HCL 4 MG/2ML IJ SOLN: 4.0000 mg | Freq: Four times a day (QID) | INTRAMUSCULAR | Status: DC | PRN

## 2021-01-17 MED ORDER — METOPROLOL TARTRATE 25 MG/10 ML ORAL SUSPENSION
25.0000 mg | Freq: Two times a day (BID) | ORAL | Status: DC
  Administered 2021-01-17 – 2021-01-29 (×24): 25 mg
  Filled 2021-01-17 (×24): qty 10

## 2021-01-17 MED ORDER — INSULIN ASPART 100 UNIT/ML IJ SOLN
8.0000 [IU] | INTRAMUSCULAR | Status: DC
  Administered 2021-01-17 – 2021-01-29 (×61): 8 [IU] via SUBCUTANEOUS

## 2021-01-17 MED ORDER — QUETIAPINE FUMARATE 25 MG PO TABS
12.5000 mg | ORAL_TABLET | Freq: Every evening | ORAL | Status: DC | PRN
  Administered 2021-01-18: 12.5 mg
  Filled 2021-01-17: qty 1

## 2021-01-17 MED ORDER — ADULT MULTIVITAMIN W/MINERALS CH
1.0000 | ORAL_TABLET | Freq: Every day | ORAL | Status: DC
  Administered 2021-01-17 – 2021-01-29 (×13): 1
  Filled 2021-01-17 (×13): qty 1

## 2021-01-17 MED ORDER — ALBUTEROL SULFATE (2.5 MG/3ML) 0.083% IN NEBU: 2.5000 mg | INHALATION_SOLUTION | RESPIRATORY_TRACT | Status: DC | PRN

## 2021-01-17 MED ORDER — ROSUVASTATIN CALCIUM 20 MG PO TABS
20.0000 mg | ORAL_TABLET | Freq: Every day | ORAL | Status: DC
  Administered 2021-01-17 – 2021-01-28 (×12): 20 mg
  Filled 2021-01-17 (×12): qty 1

## 2021-01-17 MED ORDER — OXYCODONE HCL 5 MG/5ML PO SOLN
2.5000 mg | ORAL | Status: DC | PRN
Start: 2021-01-17 — End: 2021-01-29
  Administered 2021-01-20 – 2021-01-24 (×5): 5 mg
  Administered 2021-01-28: 2.5 mg
  Filled 2021-01-17 (×7): qty 5

## 2021-01-17 MED ORDER — LISINOPRIL 2.5 MG PO TABS
2.5000 mg | ORAL_TABLET | Freq: Every day | ORAL | Status: DC
  Administered 2021-01-17 – 2021-01-29 (×13): 2.5 mg
  Filled 2021-01-17 (×13): qty 1

## 2021-01-17 MED ORDER — FLUOXETINE HCL 20 MG PO CAPS
40.0000 mg | ORAL_CAPSULE | Freq: Every day | ORAL | Status: DC
  Administered 2021-01-17 – 2021-01-29 (×13): 40 mg
  Filled 2021-01-17 (×13): qty 2

## 2021-01-17 MED ORDER — THIAMINE HCL 100 MG PO TABS
100.0000 mg | ORAL_TABLET | Freq: Every day | ORAL | Status: DC
  Administered 2021-01-17 – 2021-01-29 (×13): 100 mg
  Filled 2021-01-17 (×13): qty 1

## 2021-01-17 MED ORDER — FREE WATER
200.0000 mL | Freq: Three times a day (TID) | Status: DC
  Administered 2021-01-17 – 2021-01-19 (×7): 200 mL

## 2021-01-17 MED ORDER — VALPROIC ACID 250 MG/5ML PO SOLN
250.0000 mg | Freq: Two times a day (BID) | ORAL | Status: DC
  Administered 2021-01-17 – 2021-01-26 (×18): 250 mg
  Filled 2021-01-17 (×18): qty 5

## 2021-01-17 MED ORDER — FOLIC ACID 1 MG PO TABS
1.0000 mg | ORAL_TABLET | Freq: Every day | ORAL | Status: DC
  Administered 2021-01-17 – 2021-01-29 (×13): 1 mg
  Filled 2021-01-17 (×13): qty 1

## 2021-01-17 MED ORDER — INSULIN ASPART 100 UNIT/ML IJ SOLN
0.0000 [IU] | INTRAMUSCULAR | Status: DC
  Administered 2021-01-17 (×2): 3 [IU] via SUBCUTANEOUS
  Administered 2021-01-17: 2 [IU] via SUBCUTANEOUS
  Administered 2021-01-17: 3 [IU] via SUBCUTANEOUS
  Administered 2021-01-18: 5 [IU] via SUBCUTANEOUS
  Administered 2021-01-18: 3 [IU] via SUBCUTANEOUS
  Administered 2021-01-18: 5 [IU] via SUBCUTANEOUS
  Administered 2021-01-18: 2 [IU] via SUBCUTANEOUS
  Administered 2021-01-18 – 2021-01-19 (×2): 3 [IU] via SUBCUTANEOUS
  Administered 2021-01-19: 2 [IU] via SUBCUTANEOUS
  Administered 2021-01-19 (×4): 3 [IU] via SUBCUTANEOUS
  Administered 2021-01-20 (×2): 2 [IU] via SUBCUTANEOUS
  Administered 2021-01-20 – 2021-01-21 (×3): 3 [IU] via SUBCUTANEOUS
  Administered 2021-01-21: 8 [IU] via SUBCUTANEOUS
  Administered 2021-01-21: 3 [IU] via SUBCUTANEOUS
  Administered 2021-01-21: 5 [IU] via SUBCUTANEOUS
  Administered 2021-01-21 – 2021-01-22 (×2): 2 [IU] via SUBCUTANEOUS
  Administered 2021-01-22 (×3): 3 [IU] via SUBCUTANEOUS
  Administered 2021-01-23 (×3): 2 [IU] via SUBCUTANEOUS
  Administered 2021-01-23: 3 [IU] via SUBCUTANEOUS
  Administered 2021-01-23: 2 [IU] via SUBCUTANEOUS
  Administered 2021-01-24: 3 [IU] via SUBCUTANEOUS
  Administered 2021-01-24: 2 [IU] via SUBCUTANEOUS
  Administered 2021-01-24 (×2): 3 [IU] via SUBCUTANEOUS
  Administered 2021-01-25 (×2): 2 [IU] via SUBCUTANEOUS
  Administered 2021-01-25: 3 [IU] via SUBCUTANEOUS
  Administered 2021-01-25 – 2021-01-26 (×4): 2 [IU] via SUBCUTANEOUS
  Administered 2021-01-26 (×2): 3 [IU] via SUBCUTANEOUS
  Administered 2021-01-26 – 2021-01-27 (×5): 2 [IU] via SUBCUTANEOUS
  Administered 2021-01-28 (×2): 3 [IU] via SUBCUTANEOUS
  Administered 2021-01-28: 2 [IU] via SUBCUTANEOUS
  Administered 2021-01-29 (×2): 3 [IU] via SUBCUTANEOUS
  Administered 2021-01-29 (×3): 2 [IU] via SUBCUTANEOUS
  Administered 2021-01-30 (×2): 3 [IU] via SUBCUTANEOUS
  Administered 2021-01-30 (×2): 2 [IU] via SUBCUTANEOUS

## 2021-01-17 NOTE — Progress Notes (Signed)
Patient arrived to unit via hospital bed with aunt. Aunt and daughter understood all unit orientation. Trach, airway and suction setup in room. Respiratory was present to assess.  Sanda Linger, LPN

## 2021-01-17 NOTE — Progress Notes (Signed)
Inpatient Rehabilitation Admission Medication Review by a Pharmacist  A complete drug regimen review was completed for this patient to identify any potential clinically significant medication issues.  High Risk Drug Classes Is patient taking? Indication by Medication  Antipsychotic Yes Valproic acid- bipolar disorder Seroquel- PRN insomnia  Anticoagulant Yes Apixaban- new onset afib   Antibiotic No   Opioid Yes Oxycodone PRN pain   Antiplatelet No   Hypoglycemics/insulin Yes Insulin- diabetes   Vasoactive Medication Yes Metoprolol- afib  Chemotherapy No   Other No      Type of Medication Issue Identified Description of Issue Recommendation(s)  Drug Interaction(s) (clinically significant)     Duplicate Therapy     Allergy     No Medication Administration End Date     Incorrect Dose     Additional Drug Therapy Needed  Metformin PTA Consider re-adding if appropriate   Significant med changes from prior encounter (inform family/care partners about these prior to discharge).    Other       Clinically significant medication issues were identified that warrant physician communication and completion of prescribed/recommended actions by midnight of the next day:  No  Name of provider notified for urgent issues identified: none  Provider Method of Notification: Secure chat     Pharmacist comments: N/a  Time spent performing this drug regimen review (minutes):  [place] 01/17/2021 3:32 PM
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
## 2021-01-18 DIAGNOSIS — S069X2D Unspecified intracranial injury with loss of consciousness of 31 minutes to 59 minutes, subsequent encounter: Secondary | ICD-10-CM

## 2021-01-18 LAB — GLUCOSE, CAPILLARY
Glucose-Capillary: 188 mg/dL — ABNORMAL HIGH (ref 70–99)
Glucose-Capillary: 223 mg/dL — ABNORMAL HIGH (ref 70–99)
Glucose-Capillary: 215 mg/dL — ABNORMAL HIGH (ref 70–99)
Glucose-Capillary: 172 mg/dL — ABNORMAL HIGH (ref 70–99)
Glucose-Capillary: 147 mg/dL — ABNORMAL HIGH (ref 70–99)

## 2021-01-18 NOTE — Plan of Care (Signed)
  Problem: Consults Goal: RH BRAIN INJURY PATIENT EDUCATION Description: Description: See Patient Education module for eduction specifics Outcome: Progressing Goal: Skin Care Protocol Initiated - if Braden Score 18 or less Description: If consults are not indicated, leave blank or document N/A Outcome: Progressing Goal: Diabetes Guidelines if Diabetic/Glucose > 140 Description: If diabetic or lab glucose is > 140 mg/dl - Initiate Diabetes/Hyperglycemia Guidelines & Document Interventions  Outcome: Progressing   Problem: RH BOWEL ELIMINATION Goal: RH STG MANAGE BOWEL WITH ASSISTANCE Description: STG Manage Bowel with Supervision Assistance. Outcome: Progressing Goal: RH STG MANAGE BOWEL W/MEDICATION W/ASSISTANCE Description: STG Manage Bowel with Medication with Supervision Assistance. Outcome: Progressing   Problem: RH BLADDER ELIMINATION Goal: RH STG MANAGE BLADDER WITH ASSISTANCE Description: STG Manage Bladder With Supervision Assistance Outcome: Progressing Goal: RH STG MANAGE BLADDER WITH MEDICATION WITH ASSISTANCE Description: STG Manage Bladder With Medication With Supervision Assistance. Outcome: Progressing   Problem: RH SKIN INTEGRITY Goal: RH STG MAINTAIN SKIN INTEGRITY WITH ASSISTANCE Description: STG Maintain Skin Integrity With Supervision Assistance. Outcome: Progressing Goal: RH STG ABLE TO PERFORM INCISION/WOUND CARE W/ASSISTANCE Description: STG Able To Perform Incision/Wound Care With Supervision Assistance. Outcome: Progressing   Problem: RH SAFETY Goal: RH STG ADHERE TO SAFETY PRECAUTIONS W/ASSISTANCE/DEVICE Description: STG Adhere to Safety Precautions With Cues and Reminders. Outcome: Progressing Goal: RH STG DECREASED RISK OF FALL WITH ASSISTANCE Description: STG Decreased Risk of Fall With Supervision Assistance. Outcome: Progressing   Problem: RH COGNITION-NURSING Goal: RH STG USES MEMORY AIDS/STRATEGIES W/ASSIST TO PROBLEM SOLVE Description: STG  Uses Memory Aids/Strategies With Cues and Reminders to Problem Solve. Outcome: Progressing Goal: RH STG ANTICIPATES NEEDS/CALLS FOR ASSIST W/ASSIST/CUES Description: STG Anticipates Needs/Calls for Assist With Cues and Reminders. Outcome: Progressing   Problem: RH KNOWLEDGE DEFICIT BRAIN INJURY Goal: RH STG INCREASE KNOWLEDGE OF SELF CARE AFTER BRAIN INJURY Description: Patient will demonstrate knowledge of medication management, bowel/bladder management, and skin/wound care with educational materials and handouts provided by staff independently at discharge. Outcome: Progressing
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
## 2021-01-18 NOTE — Progress Notes (Signed)
Patient remains alert and breathing unlabored. Copious secretions upon suctioning.Chest very wet and required frequent suctioning. Tube Feeding running at recommended rate with water flushes in between. VS stable. Denies pain. Voiding ( Saturated diapers). Will require condom catheter for urine Output monitoring. HOB >30 .Safety maintained at all times.

## 2021-01-18 NOTE — Plan of Care (Signed)
  Problem: RH Balance Goal: LTG: Patient will maintain dynamic sitting balance (OT) Description: LTG:  Patient will maintain dynamic sitting balance with assistance during activities of daily living (OT) Flowsheets (Taken 01/18/2021 1228) LTG: Pt will maintain dynamic sitting balance during ADLs with: Minimal Assistance - Patient > 75% Goal: LTG Patient will maintain dynamic standing with ADLs (OT) Description: LTG:  Patient will maintain dynamic standing balance with assist during activities of daily living (OT)  Flowsheets (Taken 01/18/2021 1228) LTG: Pt will maintain dynamic standing balance during ADLs with: Minimal Assistance - Patient > 75%   Problem: Sit to Stand Goal: LTG:  Patient will perform sit to stand in prep for activites of daily living with assistance level (OT) Description: LTG:  Patient will perform sit to stand in prep for activites of daily living with assistance level (OT) Flowsheets (Taken 01/18/2021 1228) LTG: PT will perform sit to stand in prep for activites of daily living with assistance level: Minimal Assistance - Patient > 75%   Problem: RH Grooming Goal: LTG Patient will perform grooming w/assist,cues/equip (OT) Description: LTG: Patient will perform grooming with assist, with/without cues using equipment (OT) Flowsheets (Taken 01/18/2021 1228) LTG: Pt will perform grooming with assistance level of: Minimal Assistance - Patient > 75%   Problem: RH Bathing Goal: LTG Patient will bathe all body parts with assist levels (OT) Description: LTG: Patient will bathe all body parts with assist levels (OT) Flowsheets (Taken 01/18/2021 1228) LTG: Pt will perform bathing with assistance level/cueing: Minimal Assistance - Patient > 75%   Problem: RH Dressing Goal: LTG Patient will perform upper body dressing (OT) Description: LTG Patient will perform upper body dressing with assist, with/without cues (OT). Flowsheets (Taken 01/18/2021 1228) LTG: Pt will perform upper  body dressing with assistance level of: Minimal Assistance - Patient > 75% Goal: LTG Patient will perform lower body dressing w/assist (OT) Description: LTG: Patient will perform lower body dressing with assist, with/without cues in positioning using equipment (OT) Flowsheets (Taken 01/18/2021 1228) LTG: Pt will perform lower body dressing with assistance level of: Minimal Assistance - Patient > 75%   Problem: RH Toileting Goal: LTG Patient will perform toileting task (3/3 steps) with assistance level (OT) Description: LTG: Patient will perform toileting task (3/3 steps) with assistance level (OT)  Flowsheets (Taken 01/18/2021 1228) LTG: Pt will perform toileting task (3/3 steps) with assistance level: Minimal Assistance - Patient > 75%   Problem: RH Toilet Transfers Goal: LTG Patient will perform toilet transfers w/assist (OT) Description: LTG: Patient will perform toilet transfers with assist, with/without cues using equipment (OT) Flowsheets (Taken 01/18/2021 1228) LTG: Pt will perform toilet transfers with assistance level of: Minimal Assistance - Patient > 75%

## 2021-01-18 NOTE — Discharge Instructions (Addendum)
Inpatient Rehab Discharge Instructions  KEYON LILLER Discharge date and time: No discharge date for patient encounter.   Activities/Precautions/ Functional Status: Activity: As tolerated Diet:  Wound Care: Routine skin checks Functional status:  ___ No restrictions     ___ Walk up steps independently ___ 24/7 supervision/assistance   ___ Walk up steps with assistance ___ Intermittent supervision/assistance  ___ Bathe/dress independently ___ Walk with walker     __x_ Bathe/dress with assistance ___ Walk Independently    ___ Shower independently ___ Walk with assistance    ___ Shower with assistance ___ No alcohol     ___ Return to work/school ________  COMMUNITY REFERRALS UPON DISCHARGE:    Home Health:   PT      OT      ST     RN     SNA     SW                    Agency:  Phone:    Outpatient: PT      OT     ST     Other:              Agency:  Phone:               Appointment Date/Time:   Medical Equipment/Items Ordered:                                                  Agency/Supplier:   GENERAL COMMUNITY RESOURCES FOR PATIENT/FAMILY: 1) SW submitted PCS referral with [employer]. Please contact to discuss status of referral.   2) If transportation services are needed, please be sure to contact [facility] [employer] [phone] if transportation services are needed to medical appointments. Must call 72hrs in advance to schedule appointment. Pt should be independent if using transportation as not likely to be able to have someone to accompany to appointment.    Special Instructions:  No driving smoking or alcohol  My questions have been answered and I understand these instructions. I will adhere to these goals and the provided educational materials after my discharge from the hospital.  Patient/Caregiver Signature _______________________________ Date __________  Clinician Signature _______________________________________ Date __________  Please bring this form and  your medication list with you to all your follow-up doctor's appointments.    ________  Information on my medicine - ELIQUIS (apixaban)  This medication education was reviewed with me or my healthcare representative as part of my discharge preparation.  Why was Eliquis prescribed for you? Eliquis was prescribed for you to reduce the risk of forming blood clots that can cause a stroke if you have a medical condition called atrial fibrillation (a type of irregular heartbeat) OR to reduce the risk of a blood clots forming after orthopedic surgery.  What do You need to know about Eliquis ? Take your Eliquis TWICE DAILY - one tablet in the morning and one tablet in the evening with or without food.  It would be best to take the doses about the same time each day.  If you have difficulty swallowing the tablet whole please discuss with your pharmacist how to take the medication safely.  Take Eliquis exactly as prescribed by your doctor and DO NOT stop taking Eliquis without talking to the doctor who prescribed the medication.  Stopping may increase your  risk of developing a new clot or stroke.  Refill your prescription before you run out.  After discharge, you should have regular check-up appointments with your healthcare provider that is prescribing your Eliquis.  In the future your dose may need to be changed if your kidney function or weight changes by a significant amount or as you get older.  What do you do if you miss a dose? If you miss a dose, take it as soon as you remember on the same day and resume taking twice daily.  Do not take more than one dose of ELIQUIS at the same time.  Important Safety Information A possible side effect of Eliquis is bleeding. You should call your healthcare provider right away if you experience any of the following: Bleeding from an injury or your nose that does not stop. Unusual colored urine (red or dark brown) or unusual colored stools (red or  black). Unusual bruising for unknown reasons. A serious fall or if you hit your head (even if there is no bleeding).  Some medicines may interact with Eliquis and might increase your risk of bleeding or clotting while on Eliquis. To help avoid this, consult your healthcare provider or pharmacist prior to using any new prescription or non-prescription medications, including herbals, vitamins, non-steroidal anti-inflammatory drugs (NSAIDs) and supplements.  This website has more information on Eliquis (apixaban): [email or web address].
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
## 2021-01-19 DIAGNOSIS — R1312 Dysphagia, oropharyngeal phase: Secondary | ICD-10-CM | POA: Diagnosis not present

## 2021-01-19 DIAGNOSIS — S069X2S Unspecified intracranial injury with loss of consciousness of 31 minutes to 59 minutes, sequela: Secondary | ICD-10-CM | POA: Diagnosis not present

## 2021-01-19 DIAGNOSIS — F1910 Other psychoactive substance abuse, uncomplicated: Secondary | ICD-10-CM

## 2021-01-19 DIAGNOSIS — Z930 Tracheostomy status: Secondary | ICD-10-CM

## 2021-01-19 DIAGNOSIS — I10 Essential (primary) hypertension: Secondary | ICD-10-CM

## 2021-01-19 LAB — GLUCOSE, CAPILLARY
Glucose-Capillary: 127 mg/dL — ABNORMAL HIGH (ref 70–99)
Glucose-Capillary: 169 mg/dL — ABNORMAL HIGH (ref 70–99)
Glucose-Capillary: 170 mg/dL — ABNORMAL HIGH (ref 70–99)
Glucose-Capillary: 172 mg/dL — ABNORMAL HIGH (ref 70–99)
Glucose-Capillary: 176 mg/dL — ABNORMAL HIGH (ref 70–99)
Glucose-Capillary: 186 mg/dL — ABNORMAL HIGH (ref 70–99)

## 2021-01-19 LAB — COMPREHENSIVE METABOLIC PANEL
ALT: 83 U/L — ABNORMAL HIGH (ref 0–44)
AST: 46 U/L — ABNORMAL HIGH (ref 15–41)
Albumin: 3 g/dL — ABNORMAL LOW (ref 3.5–5.0)
Alkaline Phosphatase: 109 U/L (ref 38–126)
Anion gap: 9 (ref 5–15)
BUN: 22 mg/dL — ABNORMAL HIGH (ref 6–20)
CO2: 31 mmol/L (ref 22–32)
Calcium: 9.5 mg/dL (ref 8.9–10.3)
Chloride: 95 mmol/L — ABNORMAL LOW (ref 98–111)
Creatinine, Ser: 0.82 mg/dL (ref 0.61–1.24)
GFR, Estimated: >60 mL/min (ref >60)
Glucose, Bld: 170 mg/dL — ABNORMAL HIGH (ref 70–99)
Potassium: 4.4 mmol/L (ref 3.5–5.1)
Sodium: 135 mmol/L (ref 135–145)
Total Bilirubin: 0.6 mg/dL (ref 0.3–1.2)
Total Protein: 7.6 g/dL (ref 6.5–8.1)

## 2021-01-19 LAB — CBC WITH DIFFERENTIAL/PLATELET
Abs Immature Granulocytes: 0.04 10*3/uL (ref 0.00–0.07)
Basophils Absolute: 0.1 10*3/uL (ref 0.0–0.1)
Basophils Relative: 1 %
Eosinophils Absolute: 0.3 10*3/uL (ref 0.0–0.5)
Eosinophils Relative: 4 %
HCT: 36 % — ABNORMAL LOW (ref 39.0–52.0)
Hemoglobin: 11.6 g/dL — ABNORMAL LOW (ref 13.0–17.0)
Immature Granulocytes: 1 %
Lymphocytes Relative: 20 %
Lymphs Abs: 1.6 10*3/uL (ref 0.7–4.0)
MCH: 30.4 pg (ref 26.0–34.0)
MCHC: 32.2 g/dL (ref 30.0–36.0)
MCV: 94.5 fL (ref 80.0–100.0)
Monocytes Absolute: 1 10*3/uL (ref 0.1–1.0)
Monocytes Relative: 13 %
Neutro Abs: 4.9 10*3/uL (ref 1.7–7.7)
Neutrophils Relative %: 61 %
Platelets: 522 10*3/uL — ABNORMAL HIGH (ref 150–400)
RBC: 3.81 MIL/uL — ABNORMAL LOW (ref 4.22–5.81)
RDW: 11.8 % (ref 11.5–15.5)
WBC: 7.9 10*3/uL (ref 4.0–10.5)
nRBC: 0 % (ref 0.0–0.2)

## 2021-01-19 MED ORDER — GLUCERNA 1.5 CAL PO LIQD
1000.0000 mL | ORAL | Status: DC
  Filled 2021-01-19: qty 1000

## 2021-01-19 MED ORDER — FREE WATER
200.0000 mL | Freq: Every day | Status: DC
  Administered 2021-01-19 – 2021-01-30 (×54): 200 mL

## 2021-01-19 MED ORDER — QUETIAPINE FUMARATE 25 MG PO TABS
12.5000 mg | ORAL_TABLET | Freq: Every evening | ORAL | Status: DC | PRN
  Administered 2021-01-20: 12.5 mg via ORAL
  Filled 2021-01-19: qty 1

## 2021-01-19 MED ORDER — QUETIAPINE FUMARATE 25 MG PO TABS
12.5000 mg | ORAL_TABLET | Freq: Every day | ORAL | Status: DC
  Administered 2021-01-19: 12.5 mg
  Filled 2021-01-19: qty 1

## 2021-01-19 MED ORDER — SCOPOLAMINE 1 MG/3DAYS TD PT72
1.0000 | MEDICATED_PATCH | TRANSDERMAL | Status: DC
  Administered 2021-01-19 – 2021-01-31 (×5): 1.5 mg via TRANSDERMAL
  Filled 2021-01-19 (×5): qty 1

## 2021-01-19 MED ORDER — GLUCERNA 1.5 CAL PO LIQD
1000.0000 mL | ORAL | Status: DC
  Administered 2021-01-19 – 2021-01-27 (×8): 1000 mL
  Filled 2021-01-19 (×16): qty 1000

## 2021-01-19 MED ORDER — METHYLPHENIDATE HCL 5 MG PO TABS
5.0000 mg | ORAL_TABLET | Freq: Two times a day (BID) | ORAL | Status: DC
  Administered 2021-01-20 – 2021-01-22 (×5): 5 mg via ORAL
  Filled 2021-01-19 (×5): qty 1

## 2021-01-19 NOTE — Progress Notes (Signed)
Physical Therapy TBI Note  Patient Details  Name: Scott Day MRN: [id number] Date of Birth: 1960-04-23  Today's Date: 01/19/2021 PT Individual Time: 1300-1400 PT Individual Time Calculation (min): 60 min   Short Term Goals: Week 1:  PT Short Term Goal 1 (Week 1): Patient will perform bed mobility with mod A of 1 person. PT Short Term Goal 2 (Week 1): Patient will perform sit to stand with mod A using LRAD. PT Short Term Goal 3 (Week 1): Patient will initiate functional transfers with mod A. PT Short Term Goal 4 (Week 1): Patient with ambulate 10 ft using LRAD with mod A. PT Short Term Goal 5 (Week 1): Patient will attend to the L with mod cues >50% of the time.  Skilled Therapeutic Interventions/Progress Updates:     Patient in TIS w/c with trach collar in place and NG tube unhooked with his daughter in the room upon PT arrival. Patient alert and agreeable to PT session. Patient denied pain during session.  Patient with improved L attention with gaze to midline and L several times with verbal cues throughout session and increased activation and volitional movement of L upper and lower extremity with automatic functional movement. Demonstrates increased recall of situation when provided orientation at beginning of session and asked patient about present situation later in the session. Patient with increased verbalizations and able to verbalize without PMSV x2. Patient with inappropriate comments x3 during session, easily redirected without escalation. Tolerated >20 min in busy environment while maintaining focus to tasks >75% of the time with mod-max cues and no signs of decreased frustration tolerance or escalating behaviors.   Maintained SPO2 >90% on RA with PMSV in place throughout session.   Therapeutic Activity: Bed Mobility: Patient performed sit to supine with mod-max A. Provided multimodal cues for lowering onto his L elbow for trunk control and bringing knees to chest to lift  lower extremities onto the bed. Patient performed scooting up in the bed x2 with min A with the bed in slight trendelenburg.  Transfers: Patient performed a dependent transfer TIS w/c>bed following NMR, see below, using Stedy with min A.   Wheelchair Mobility:  Patient was transported in the w/c with total A throughout session for energy conservation and time management. Provided tour of facilities with patient recognizing 2/3 gyms from previous hospital stay.   Neuromuscular Re-ed: Patient performed the following activities for improved lower extremity motor control and balance with functional activities: -sit to stand x5 in // bars focused on initiation with reduced cues with each trial, and L hemi-body elongation, used hand over hand assist to place L hand on bar with patient able to grip once in place; progressed from mod A to min A for facilitating forward weight shift and L knee/hip elongation -standing balance in // bars with min A progressing to CGA focused on midline orientation and L hemi-body elongation, progressed with weight shifting to engage L hemi-body with pushing through L lower and upper extremity -patient initiated ambulation in // bars taking 6 steps and 4 steps progressing from total A for L limb advancement to no assist for limb advancement with facilitation for weight shifting and max cues for attention to task and initiation in a busy environment  Patient in bed with his daughter in the room at end of session with breaks locked, bed alarm set, and all needs within reach.   Therapy Documentation Precautions:  Precautions Precautions: Fall Precaution Comments: [name], Lt hemiparesis Restrictions Weight Bearing Restrictions: No  Agitated Behavior Scale: TBI Observation Details Observation Environment: pt room Start of observation period - Date: 01/19/21 Start of observation period - Time: 1300 End of observation period - Date: 01/19/21 End of observation  period - Time: 1400 Agitated Behavior Scale (DO NOT LEAVE BLANKS) Short attention span, easy distractibility, inability to concentrate: Present to a moderate degree Impulsive, impatient, low tolerance for pain or frustration: Absent Uncooperative, resistant to care, demanding: Absent Violent and/or threatening violence toward people or property: Absent Explosive and/or unpredictable anger: Absent Rocking, rubbing, moaning, or other self-stimulating behavior: Absent Pulling at tubes, restraints, etc.: Absent Wandering from treatment areas: Absent Restlessness, pacing, excessive movement: Absent Repetitive behaviors, motor, and/or verbal: Present to a slight degree Rapid, loud, or excessive talking: Absent Sudden changes of mood: Absent Easily initiated or excessive crying and/or laughter: Absent Self-abusiveness, physical and/or verbal: Absent Agitated behavior scale total score: 17    Therapy/Group: Individual Therapy  Makinzee Durley L Slaton Reaser PT, DPT  01/19/2021, 3:54 PM

## 2021-01-19 NOTE — Progress Notes (Signed)
Patient information reviewed and entered into [facility] by Becky Coleston Dirosa, PPS coordinator. Information including medical coding, function ability, and quality indicators will be reviewed and updated through discharge.   
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
## 2021-01-19 NOTE — Progress Notes (Signed)
Occupational Therapy Session Note  Patient Details  Name: Scott Day MRN: [id number] Date of Birth: 05-02-1960  Today's Date: 01/19/2021 OT Individual Time: 1045-1200 OT Individual Time Calculation (min): 75 min    Short Term Goals: Week 1:  OT Short Term Goal 1 (Week 1): Pt will complete sit<stand during LB ADL with 1 assist OT Short Term Goal 2 (Week 1): Pt will scan to midline with mod cues to locate needed ADL or therapeutic item OT Short Term Goal 3 (Week 1): Pt will exhibit increased awareness by indicating to OT that he needs to use the restroom x2 sessions  Skilled Therapeutic Interventions/Progress Updates:    Patient in bed, alert, able to state his last name and daughters name.  Daughter present for session.  Patient denies pain.  PMV placed.  On O2 via trach collar t/o session.  Completed lower body bathing and dressing at bed level - he is able to reach both feet, legs and groin, requires mod/max A for thoroughness, max a to donn incontinence brief and shorts.  Dependent for teds and slipper socks.  Supine to sitting edge of bed with mod A.  Able to maintain unsupported sitting with CGA.  Sit to stand on stedy surface with min A and min cues for positioning.   Utilized stedy to w/c surface.  Completed upper body bathing and dressing w/c level with mod A for thoroughness.  Washed hair with shampoo cap - he combed right side of head with set up, max A to brush left.  Cues t/o session for left side awareness, occ spontaneous use of left hand.  Completed left UE stretch, AAROM, facilitation of use and positioning.  Reviewed features of TIS w/c with daughter and educated on safe repositioning - with good understanding demonstrated.  He remained in w/c at close of session, seat belt alarm set and call bell in reach, daughter present.    Therapy Documentation Precautions:  Precautions Precautions: Fall Precaution Comments: [name], Lt hemiparesis Restrictions Weight Bearing  Restrictions: No   Therapy/Group: Individual Therapy  [name] 01/19/2021, 7:47 AM
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
## 2021-01-19 NOTE — Care Management (Signed)
[facility] Individual Statement of Services  Patient Name:  Scott Day  Date:  01/19/2021  Welcome to the [facility].  Our goal is to provide you with an individualized program based on your diagnosis and situation, designed to meet your specific needs.  With this comprehensive rehabilitation program, you will be expected to participate in at least 3 hours of rehabilitation therapies Monday-Friday, with modified therapy programming on the weekends.  Your rehabilitation program will include the following services:  Physical Therapy (PT), Occupational Therapy (OT), 24 hour per day rehabilitation nursing, Therapeutic Recreaction (TR), Psychology, Neuropsychology, Care Coordinator, Rehabilitation Medicine, [place], and Other  Weekly team conferences will be held on Tuesdays to discuss your progress.  Your Inpatient Rehabilitation Care Coordinator will talk with you frequently to get your input and to update you on team discussions.  Team conferences with you and your family in attendance may also be held.  Expected length of stay: 3-4 weeks  Overall anticipated outcome: Minimal Assistance  Depending on your progress and recovery, your program may change. Your Inpatient Rehabilitation Care Coordinator will coordinate services and will keep you informed of any changes. Your Inpatient Rehabilitation Care Coordinator's name and contact numbers are listed  below.  The following services may also be recommended but are not provided by the [facility] will be made to provide these services after discharge if needed.  Arrangements include referral to agencies that provide these services.  Your insurance has been verified to be:  [place] [employer]  Your primary doctor  is:  Psychologist, educational  Pertinent information will be shared with your doctor and your insurance company.  Inpatient Rehabilitation Care Coordinator:  Cathleen Corti [phone] or ([phone]  Information discussed with and copy given to patient by: Rana Snare, 01/19/2021, 9:20 AM

## 2021-01-19 NOTE — Plan of Care (Signed)
  Problem: RH Swallowing Goal: LTG Pt will demonstrate functional change in swallow as evidenced by bedside/clinical objective assessment (SLP) Description: LTG: Patient will demonstrate functional change in swallow as evidenced by bedside/clinical objective assessment (SLP) Flowsheets (Taken 01/19/2021 1159) LTG: Patient will demonstrate functional change in swallow as evidenced by bedside/clinical objective assessment: Oropharyngeal swallow   Problem: RH Cognition - SLP Goal: RH LTG Patient will demonstrate orientation with cues Description:  LTG:  Patient will demonstrate orientation to person/place/time/situation with cues (SLP)   Flowsheets (Taken 01/19/2021 1159) LTG Patient will demonstrate orientation to:  Person  Place  Time  Situation LTG: Patient will demonstrate orientation using cueing (SLP): Supervision   Problem: RH Comprehension Communication Goal: LTG Patient will comprehend basic/complex auditory (SLP) Description: LTG: Patient will comprehend basic/complex auditory information with cues (SLP). Flowsheets (Taken 01/19/2021 1159) LTG: Patient will comprehend: Basic auditory information LTG: Patient will comprehend auditory information with cueing (SLP): Supervision   Problem: RH Expression Communication Goal: LTG Patient will verbally express basic/complex needs(SLP) Description: LTG:  Patient will verbally express basic/complex needs, wants or ideas with cues  (SLP) Flowsheets (Taken 01/19/2021 1159) LTG: Patient will verbally express basic/complex needs, wants or ideas (SLP): Supervision Goal: LTG Patient will increase word finding of common (SLP) Description: LTG:  Patient will increase word finding of common objects/daily info/abstract thoughts with cues using compensatory strategies (SLP). Flowsheets (Taken 01/19/2021 1159) LTG: Patient will increase word finding of common (SLP): Supervision   Problem: RH Attention Goal: LTG Patient will demonstrate this level  of attention during functional activites (SLP) Description: LTG:  Patient will will demonstrate this level of attention during functional activites (SLP) Flowsheets (Taken 01/19/2021 1159) Patient will demonstrate during cognitive/linguistic activities the attention type of:  Focused  Sustained Patient will demonstrate this level of attention during cognitive/linguistic activities in:  Controlled  Home LTG: Patient will demonstrate this level of attention during cognitive/linguistic activities with assistance of (SLP): Minimal Assistance - Patient > 75%   Problem: RH Awareness Goal: LTG: Patient will demonstrate awareness during functional activites type of (SLP) Description: LTG: Patient will demonstrate awareness during functional activites type of (SLP) Flowsheets (Taken 01/19/2021 1159) Patient will demonstrate during cognitive/linguistic activities awareness type of:  Intellectual  Emergent LTG: Patient will demonstrate awareness during cognitive/linguistic activities with assistance of (SLP): Minimal Assistance - Patient > 75%
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
## 2021-01-19 NOTE — Plan of Care (Signed)
  Problem: Consults Goal: RH BRAIN INJURY PATIENT EDUCATION Description: Description: See Patient Education module for eduction specifics Outcome: Progressing Goal: Skin Care Protocol Initiated - if Braden Score 18 or less Description: If consults are not indicated, leave blank or document N/A Outcome: Progressing Goal: Diabetes Guidelines if Diabetic/Glucose > 140 Description: If diabetic or lab glucose is > 140 mg/dl - Initiate Diabetes/Hyperglycemia Guidelines & Document Interventions  Outcome: Progressing   Problem: RH BOWEL ELIMINATION Goal: RH STG MANAGE BOWEL WITH ASSISTANCE Description: STG Manage Bowel with Supervision Assistance. Outcome: Progressing Goal: RH STG MANAGE BOWEL W/MEDICATION W/ASSISTANCE Description: STG Manage Bowel with Medication with Supervision Assistance. Outcome: Progressing   Problem: RH BLADDER ELIMINATION Goal: RH STG MANAGE BLADDER WITH ASSISTANCE Description: STG Manage Bladder With Supervision Assistance Outcome: Progressing Goal: RH STG MANAGE BLADDER WITH MEDICATION WITH ASSISTANCE Description: STG Manage Bladder With Medication With Supervision Assistance. Outcome: Progressing   Problem: RH SKIN INTEGRITY Goal: RH STG MAINTAIN SKIN INTEGRITY WITH ASSISTANCE Description: STG Maintain Skin Integrity With Supervision Assistance. Outcome: Progressing Goal: RH STG ABLE TO PERFORM INCISION/WOUND CARE W/ASSISTANCE Description: STG Able To Perform Incision/Wound Care With Supervision Assistance. Outcome: Progressing   Problem: RH SAFETY Goal: RH STG ADHERE TO SAFETY PRECAUTIONS W/ASSISTANCE/DEVICE Description: STG Adhere to Safety Precautions With Cues and Reminders. Outcome: Progressing Goal: RH STG DECREASED RISK OF FALL WITH ASSISTANCE Description: STG Decreased Risk of Fall With Supervision Assistance. Outcome: Progressing   Problem: RH COGNITION-NURSING Goal: RH STG USES MEMORY AIDS/STRATEGIES W/ASSIST TO PROBLEM SOLVE Description: STG  Uses Memory Aids/Strategies With Cues and Reminders to Problem Solve. Outcome: Progressing Goal: RH STG ANTICIPATES NEEDS/CALLS FOR ASSIST W/ASSIST/CUES Description: STG Anticipates Needs/Calls for Assist With Cues and Reminders. Outcome: Progressing   Problem: RH KNOWLEDGE DEFICIT BRAIN INJURY Goal: RH STG INCREASE KNOWLEDGE OF SELF CARE AFTER BRAIN INJURY Description: Patient will demonstrate knowledge of medication management, bowel/bladder management, and skin/wound care with educational materials and handouts provided by staff independently at discharge. Outcome: Progressing

## 2021-01-20 DIAGNOSIS — R1312 Dysphagia, oropharyngeal phase: Secondary | ICD-10-CM | POA: Diagnosis not present

## 2021-01-20 DIAGNOSIS — I10 Essential (primary) hypertension: Secondary | ICD-10-CM | POA: Diagnosis not present

## 2021-01-20 DIAGNOSIS — S069X2S Unspecified intracranial injury with loss of consciousness of 31 minutes to 59 minutes, sequela: Secondary | ICD-10-CM | POA: Diagnosis not present

## 2021-01-20 DIAGNOSIS — Z930 Tracheostomy status: Secondary | ICD-10-CM | POA: Diagnosis not present

## 2021-01-20 LAB — GLUCOSE, CAPILLARY
Glucose-Capillary: 119 mg/dL — ABNORMAL HIGH (ref 70–99)
Glucose-Capillary: 123 mg/dL — ABNORMAL HIGH (ref 70–99)
Glucose-Capillary: 125 mg/dL — ABNORMAL HIGH (ref 70–99)
Glucose-Capillary: 142 mg/dL — ABNORMAL HIGH (ref 70–99)
Glucose-Capillary: 151 mg/dL — ABNORMAL HIGH (ref 70–99)
Glucose-Capillary: 180 mg/dL — ABNORMAL HIGH (ref 70–99)
Glucose-Capillary: 93 mg/dL (ref 70–99)

## 2021-01-20 MED ORDER — QUETIAPINE FUMARATE 25 MG PO TABS
25.0000 mg | ORAL_TABLET | Freq: Every day | ORAL | Status: DC
  Administered 2021-01-20 – 2021-01-21 (×2): 25 mg
  Filled 2021-01-20 (×2): qty 1

## 2021-01-20 MED ORDER — QUETIAPINE FUMARATE 25 MG PO TABS
25.0000 mg | ORAL_TABLET | Freq: Every evening | ORAL | Status: DC | PRN
  Administered 2021-01-23: 25 mg via ORAL
  Filled 2021-01-20 (×2): qty 1
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
## 2021-01-20 NOTE — Progress Notes (Signed)
Occupational Therapy TBI Note  Patient Details  Name: Scott Day MRN: [id number] Date of Birth: 1961-02-05  Today's Date: 01/20/2021 OT Individual Time: 1110-1200 OT Individual Time Calculation (min): 50 min    Short Term Goals: Week 1:  OT Short Term Goal 1 (Week 1): Pt will complete sit<stand during LB ADL with 1 assist OT Short Term Goal 2 (Week 1): Pt will scan to midline with mod cues to locate needed ADL or therapeutic item OT Short Term Goal 3 (Week 1): Pt will exhibit increased awareness by indicating to OT that he needs to use the restroom x2 sessions  Skilled Therapeutic Interventions/Progress Updates:   Pt received in wc asleep with daughter present, helpful throughout tx, with pt agreeable to OT session focusing on self-care, focused attention to BADLs at sink level midline orientation. Pt with no c/o pain. Pt with R gaze preference and R head turn throughout tx with poor focused attn. Vitals stable on RA with PMSV on.   ADL: Pt completes BADL at overall mod-max level. Skilled interventions include: Pt slow to awaken did so after increased vocal & tactile stimulation. Pt requires skilled multimodal cuing for focused attention and orientation to midline. Pt with difficulties motor planning & initiation of task doffing shirt, and required max multimodal cuing. Daughter helped coach pt verbally. OT/OTS facilitated backwards & forwards chaining to bathe UB with pt not attending to L side. Cuing was offered at different intervals to observe pt response. Pt restless during tx and was found to be incontinent of bowel (charted in flowsheets). Pt tolerated standing at sink with hand-over-hand guidance for L UE while peri care was performed. Returned to bed stand > pivot mod A of 2 with increased tactile cuing towards L side. Pt able to return to supine with max multimodal cuing. 02 via trach applied and NG reapplied/running. Pt left at end of session in bed with exit alarm on, HOB elevated  at 30 degrees, call light in reach and all needs met.   Therapy Documentation Precautions:  Precautions Precautions: Fall Precaution Comments: [name], Lt hemiparesis Restrictions Weight Bearing Restrictions: No  Agitated Behavior Scale: TBI Observation Details Observation Environment: Pts room Start of observation period - Date: 01/20/21 Start of observation period - Time: 1108 End of observation period - Date: 01/20/21 End of observation period - Time: 1205    01/20/21 1251  Observation Details  Observation Environment Pts room  Start of observation period - Date 01/20/21  Start of observation period - Time 1108  End of observation period - Date 01/20/21  End of observation period - Time 1205  Agitated Behavior Scale (DO NOT LEAVE BLANKS)  Short attention span, easy distractibility, inability to concentrate 4  Impulsive, impatient, low tolerance for pain or frustration 2  Uncooperative, resistant to care, demanding 1  Violent and/or threatening violence toward people or property 1  Explosive and/or unpredictable anger 1  Rocking, rubbing, moaning, or other self-stimulating behavior 1  Pulling at tubes, restraints, etc. 1  Wandering from treatment areas 1  Restlessness, pacing, excessive movement 3  Repetitive behaviors, motor, and/or verbal 1  Rapid, loud, or excessive talking 2  Sudden changes of mood 1  Easily initiated or excessive crying and/or laughter 1  Self-abusiveness, physical and/or verbal 1  Agitated behavior scale total score 21    Therapy/Group: Individual Therapy  Andrae Claunch 01/20/2021, 7:21 AM

## 2021-01-20 NOTE — Progress Notes (Addendum)
Physical Therapy TBI Note  Patient Details  Name: Scott Day MRN: [id number] Date of Birth: [birth year]  Today's Date: 01/20/2021 PT Co-Treatment Time: 0930-1000 PT Co-Treatment Time Calculation (min): 30 min  Short Term Goals: Week 1:  PT Short Term Goal 1 (Week 1): Patient will perform bed mobility with mod A of 1 person. PT Short Term Goal 2 (Week 1): Patient will perform sit to stand with mod A using LRAD. PT Short Term Goal 3 (Week 1): Patient will initiate functional transfers with mod A. PT Short Term Goal 4 (Week 1): Patient with ambulate 10 ft using LRAD with mod A. PT Short Term Goal 5 (Week 1): Patient will attend to the L with mod cues >50% of the time.  Skilled Therapeutic Interventions/Progress Updates:     Patient in bed with Katharine Look, OT, Danise Mina, OT student, and [name], patient's daughter in the room with patient performing dressing upon PT arrival. Patient alert and agreeable to PT/OT co-treatment session. Patient denied pain during session.  Patient disconnected from NG tube and PMSV in place prior to PT arrival, removed trach collar and patient maintained SPO2 >90% on RA with continuous pulse-ox throughout session.   Focused session on transfer and gait training with increased L hemi-body motor control with functional mobility. OT addressed midline orientation and attention during functional tasks, see OT note for details. Patient's daughter assisted with providing visual target and cues for focused attention during functional mobility throughout session.   Therapeutic Activity: Bed Mobility: Patient performed rolling L with min facilitation +2 for lower extremity and trunk management and supine to sit with mod A and second person SBA for safety. Provided verbal cues for pushing up to his elbow then his hand to come to sitting. Transfers: Patient performed stand pivot bed>w/c with mod-min A +2 with B HHA or 3 Musketeer technique. He performed sit to/from stand x3 with min  A +2. Provided multimodal cues for initiation, forward weight shift, and increased L knee/hip/trunk extension to come to standing.  Gait Training:  Patient ambulated >100 feet x2 using Swedish walker on first trial and 3 musketeer technique on second trial with mod A +2. Ambulated with narrow BOS with increased adductor tone on L, decreased L knee and hip extension in stance, decreased R weight shift, decreased B step height and length L>R, and mod cuing for attention to task in a low-mod stimulation environment (day room with doors partially closed and 1 other patient in the room intermittently). Provided multimodal cues and facilitation for initiation, weight shifting, increased step length, increased gait speed and propulsion, and increased BOS.  Neuromuscular Re-ed: Patient performed the following activities for improved midline and L orientation: -sat at a open window in the Day room with window on his L with OT provided cues and stimulation for midline and L attention as PT provided manual facilitation for cervical rotation, progressing from max A to min A to initiation and sustain L rotation, able to sustain 2-10 sec with max cuing  Patient on FiO2 28% via trach collar with PMSV removed sitting in the in w/c with his daughter in the room at end of session with breaks locked and all needs within reach.   Therapy Documentation Precautions:  Precautions Precautions: Fall Precaution Comments: [name], Lt hemiparesis Restrictions Weight Bearing Restrictions: No Agitated Behavior Scale: TBI Observation Details Observation Environment: Pts room Start of observation period - Date: 01/20/21 Start of observation period - Time: 0900 End of observation period - Date: 01/20/21 End  of observation period - Time: 1000 Agitated Behavior Scale (DO NOT LEAVE BLANKS) Short attention span, easy distractibility, inability to concentrate: Present to a moderate degree Impulsive, impatient, low  tolerance for pain or frustration: Absent Uncooperative, resistant to care, demanding: Absent Violent and/or threatening violence toward people or property: Absent Explosive and/or unpredictable anger: Absent Rocking, rubbing, moaning, or other self-stimulating behavior: Absent Pulling at tubes, restraints, etc.: Absent Wandering from treatment areas: Absent Restlessness, pacing, excessive movement: Present to a slight degree Repetitive behaviors, motor, and/or verbal: Absent Rapid, loud, or excessive talking: Absent Sudden changes of mood: Absent Easily initiated or excessive crying and/or laughter: Absent Self-abusiveness, physical and/or verbal: Absent Agitated behavior scale total score: 18    Therapy/Group: Co-Treatment  Nakaya Mishkin L Nahum Sherrer PT, DPT  01/20/2021, 4:09 PM

## 2021-01-20 NOTE — Progress Notes (Signed)
Occupational Therapy TBI Note  Patient Details  Name: Scott Day MRN: [id number] Date of Birth: 06/16/1960  Today's Date: 01/20/2021 OT Individual Time: 6283-6629 OT Individual Time Calculation (min): 15 min  and Today's Date: 01/20/2021 OT Co-Treatment Time: 0900-0930 OT Co-Treatment Time Calculation (min): 30 min   Short Term Goals: Week 1:  OT Short Term Goal 1 (Week 1): Pt will complete sit<stand during LB ADL with 1 assist OT Short Term Goal 2 (Week 1): Pt will scan to midline with mod cues to locate needed ADL or therapeutic item OT Short Term Goal 3 (Week 1): Pt will exhibit increased awareness by indicating to OT that he needs to use the restroom x2 sessions  Skilled Therapeutic Interventions/Progress Updates:    Individual time: pt supine with no c/o pain, daughter [name] present and supportive throughout session. Pt with obvious R attention/head turn, as well as entire body R sidelying in bed. Max multimodal cueing for pt to locate midline with poor focused attention. Pants donned max A bed level and then pulled up in standing once PT entered room for co-treat. Ensured clean brief with no incontinence. Pt's vitals stable on RA, with PMSV on.   Cotreatment with PT Cherie.  OT goals addressed included functional mobility/transfer training, midline orientation, focused and sustained attention to task, and general motor planning. Pt completed stand step transfer from bed > TIS w/c with mod +2 assist, requiring multimodal cueing for initiation of task, as well as motor planning. Tactile cues helpful for finding midline and for activation of R vs L LE. In the dayroom pt completed several bouts of functional mobility (over 100 ft), first with swedish walker and then bimanual HHA. See PT notes for detailed gait assessment. Increased time required throughout session for copious skilled cueing from OT and PT. Pt returned to his room and was left sitting up in the TIS w/c. O2 via trach  reapplied and NG reapplied and running. Pt was positioned with several pillows and padded along body prominences and behind his head to ensure comfort. Pt quite fatigued at this point. Pt's daughter present supervising pt upon OT/PT leaving the room.     Therapy Documentation Precautions:  Precautions Precautions: Fall Precaution Comments: [name], Lt hemiparesis Restrictions Weight Bearing Restrictions: No  Agitated Behavior Scale: TBI Observation Details Observation Environment: CIR Start of observation period - Date: 01/20/21 Start of observation period - Time: 0845 End of observation period - Date: 01/20/21 End of observation period - Time: 1000 Agitated Behavior Scale (DO NOT LEAVE BLANKS) Short attention span, easy distractibility, inability to concentrate: Present to an extreme degree Impulsive, impatient, low tolerance for pain or frustration: Present to a slight degree Uncooperative, resistant to care, demanding: Absent Violent and/or threatening violence toward people or property: Absent Explosive and/or unpredictable anger: Absent Rocking, rubbing, moaning, or other self-stimulating behavior: Absent Pulling at tubes, restraints, etc.: Absent Wandering from treatment areas: Absent Restlessness, pacing, excessive movement: Present to a moderate degree Repetitive behaviors, motor, and/or verbal: Present to a slight degree Rapid, loud, or excessive talking: Absent Sudden changes of mood: Absent Easily initiated or excessive crying and/or laughter: Absent Self-abusiveness, physical and/or verbal: Absent Agitated behavior scale total score: 21     Therapy/Group: Individual Therapy and Co-Treatment  Curtis Sites 01/20/2021, 12:24 PM

## 2021-01-20 NOTE — Progress Notes (Signed)
Patient ID: Scott Day, male   DOB: [birth year], 60 y.o.   MRN: [id number]  SW met with pt and pt dtr [name] in room to provide updates from team conference, and d/c date 3-4 weeks. She reported she intends to return to work and will use intermittent FMLA. Will take off more time once he is ready for discharge. SW shared will schedule family edu once there is a d/c date in place.   SW will continue to assess pt for d/c needs.   [name], MSW, [place] Office: [phone] Cell: [phone] Fax: [phone]
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
## 2021-01-20 NOTE — Progress Notes (Signed)
Pt continuously pulling at cortrac. Soft hand mitten applied.
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
## 2021-01-20 NOTE — Progress Notes (Addendum)
Speech Language Pathology TBI Note  Patient Details  Name: Scott Day MRN: [id number] Date of Birth: 08-26-1960  Today's Date: 01/20/2021 SLP Individual Time: 4098-1191 SLP Individual Time Calculation (min): 45 min  Short Term Goals: Week 1: SLP Short Term Goal 1 (Week 1): Patient will wear PMSV without O2 saturations dropping below 90% and without signs of distress throughout an entirety of a session SLP Short Term Goal 2 (Week 1): Pt will increase sustained attention to functional tasks to 1-3 minutes providing mod-max A SLP Short Term Goal 3 (Week 1): Patient will demonstrate 75% speech intelligibility at the word level with Mod A verbal and visual cues for use of speech intelligibility strategies. SLP Short Term Goal 4 (Week 1): Patient will answer personal, orientation and environmental yes/no questions with 75% accuracy and mod A verbal/visual cues SLP Short Term Goal 5 (Week 1): Patient will consume therapeutic PO trials of puree/HTL textures with SLP only with minimal overt s/sx of aspiration and mod A verbal/visual cues SLP Short Term Goal 6 (Week 1): Patient will verbally express basic wants/needs with 50% accuracy provided mod A verbal and visual cues  Skilled Therapeutic Interventions: Pt received in bed and sleeping with HOB at 30 degrees. Daughter was present at bedside. Pt roused with verbal/tactile stimulation and agreeable to skilled ST intervention with focus on speech and swallowing goals. Pt was alert but restless and exhibited significantly reduced focused attention throughout session (~30 seconds). PMSV donned and maintained O2>96-98% and without signs of distress for 40 minute duration. Vocal quality perceived as rough in which daughter supported at baseline. Daughter also reported grunting noises/behaviors as baseline and not generally indicative of distress/discomfort. Speech intelligibility perceived as ~50% intelligible at word level. Pt still presenting with copious  secretions which were suctioned from trach site. Pt verbalized first/last name, daughter's name given field of 3 verbal choices, and son's name without cues. Pt with difficulty responding to biographical and environmental yes/no questions and oriented to person only. Pt had loose BM during session in which he only acknowledged afterwards due to wetness. Pt followed 1-step directions for peri care with mod-to-max A verbal/visual/tactile cues. Following peri care, oral care was completed with total A using suction toothbrush. Pt accepted honey thick liquids by spoon x3 and demonstrated oral holding, delayed/effortful AP transit, and suspect delayed swallow initiation. Suspect oral deficits were exacerbated by internal/external distractibility resulting in poor awareness of bolus. Pt without overt s/sx of aspiration although delayed throat clear x1. Trials discontinued due to restlessness. Continue NPO status with alternate form of nutrition. Continue PO trials with ST after oral care with honey thick liquids, pureed textures, or ice chips as tolerated. Patient was left in bed at end of session and passed off to OT. PMSV left in place and requested OT to doff as needed/at end of session. Continue current POC.    Pain Pain Assessment Pain Scale: 0-10 Pain Score: 0-No pain PAINAD (Pain Assessment in Advanced Dementia) Breathing: normal Negative Vocalization: none Facial Expression: smiling or inexpressive Body Language: relaxed Consolability: no need to console PAINAD Score: 0  Agitated Behavior Scale: TBI Observation Details Observation Environment: pt room Start of observation period - Date: 01/20/21 Start of observation period - Time: 0800 End of observation period - Date: 01/21/21 End of observation period - Time: 0845 Agitated Behavior Scale (DO NOT LEAVE BLANKS) Short attention span, easy distractibility, inability to concentrate: Present to a moderate degree Impulsive, impatient, low  tolerance for pain or frustration: Present to a  slight degree Uncooperative, resistant to care, demanding: Present to a slight degree Violent and/or threatening violence toward people or property: Absent Explosive and/or unpredictable anger: Absent Rocking, rubbing, moaning, or other self-stimulating behavior: Absent Pulling at tubes, restraints, etc.: Present to a slight degree Wandering from treatment areas: Absent Restlessness, pacing, excessive movement: Present to a moderate degree Repetitive behaviors, motor, and/or verbal: Present to a slight degree Rapid, loud, or excessive talking: Absent Sudden changes of mood: Absent Easily initiated or excessive crying and/or laughter: Absent Self-abusiveness, physical and/or verbal: Absent Agitated behavior scale total score: 22  Therapy/Group: Individual Therapy  Patty Sermons 01/20/2021, 10:26 AM

## 2021-01-20 NOTE — Procedures (Signed)
Tracheostomy Change Note  Patient Details:   Name: Scott Day DOB: Feb 07, 1961 MRN: [id number]    Airway Documentation:     Evaluation  O2 sats: No complications with change  Complications: No apparent complications Patient did tolerate procedure well. Bilateral Breath Sounds: Diminished, Rhonchi  Pt's trach was changed per Dr. Naaman Plummer to a #4 CFS shiley with 2 Rts at bedside. No complications were noted with change. Color change was noted post trach insertion. No blood present. RT will monitor.     Ronaldo Miyamoto 01/20/2021, 2:29 PM

## 2021-01-21 DIAGNOSIS — I10 Essential (primary) hypertension: Secondary | ICD-10-CM | POA: Diagnosis not present

## 2021-01-21 DIAGNOSIS — S069X2S Unspecified intracranial injury with loss of consciousness of 31 minutes to 59 minutes, sequela: Secondary | ICD-10-CM | POA: Diagnosis not present

## 2021-01-21 DIAGNOSIS — Z930 Tracheostomy status: Secondary | ICD-10-CM | POA: Diagnosis not present

## 2021-01-21 DIAGNOSIS — R1312 Dysphagia, oropharyngeal phase: Secondary | ICD-10-CM | POA: Diagnosis not present

## 2021-01-21 LAB — GLUCOSE, CAPILLARY
Glucose-Capillary: 100 mg/dL — ABNORMAL HIGH (ref 70–99)
Glucose-Capillary: 125 mg/dL — ABNORMAL HIGH (ref 70–99)
Glucose-Capillary: 138 mg/dL — ABNORMAL HIGH (ref 70–99)
Glucose-Capillary: 151 mg/dL — ABNORMAL HIGH (ref 70–99)
Glucose-Capillary: 160 mg/dL — ABNORMAL HIGH (ref 70–99)
Glucose-Capillary: 194 mg/dL — ABNORMAL HIGH (ref 70–99)
Glucose-Capillary: 201 mg/dL — ABNORMAL HIGH (ref 70–99)

## 2021-01-21 NOTE — Plan of Care (Addendum)
Behavioral Plan   [place] Level: V  Behavior to decrease/ eliminate:  -Restlessness -L inattention -Inappropriate with staff -Arousal at night   Changes to environment:  -Sleep wake cycle promotion- lights on, blinds up during day, lights off at night -Music on, no TV   Interventions: -Encourage L attention/scanning  -Provide gentle reorientation as needed -Sleep chart every night -B soft wrist restraints (family cleared to have them removed when in the room) -Telesitter    Recommendations for interactions with patient: -Minimize unnecessary stimuli- excessive conversation, etc.  -simple 1 step directions for basic wants -Redirect or ignore inappropriate comments/grabbing, Do not engage or reprimand patient, this is not intentional, provide alternative object to put in hands   Attendees:  Laverle Hobby, OT [employer], OTS [place], PT [place], [place]

## 2021-01-21 NOTE — Progress Notes (Signed)
Pt is moving all over the bed and attempting to pull at tubes. Primary nurse places mitt over pt's right hand. Telesitter ordered.

## 2021-01-21 NOTE — Progress Notes (Addendum)
Speech Language Pathology TBI Note  Patient Details  Name: Scott Day MRN: [id number] Date of Birth: Aug 04, 1960  Today's Date: 01/21/2021 SLP Individual Time: 2831-5176 SLP Individual Time Calculation (min): 45 min  Short Term Goals: Week 1: SLP Short Term Goal 1 (Week 1): Patient will wear PMSV without O2 saturations dropping below 90% and without signs of distress throughout an entirety of a session SLP Short Term Goal 2 (Week 1): Pt will increase sustained attention to functional tasks to 1-3 minutes providing mod-max A SLP Short Term Goal 3 (Week 1): Patient will demonstrate 75% speech intelligibility at the word level with Mod A verbal and visual cues for use of speech intelligibility strategies. SLP Short Term Goal 4 (Week 1): Patient will answer personal, orientation and environmental yes/no questions with 75% accuracy and mod A verbal/visual cues SLP Short Term Goal 5 (Week 1): Patient will consume therapeutic PO trials of puree/HTL textures with SLP only with minimal overt s/sx of aspiration and mod A verbal/visual cues SLP Short Term Goal 6 (Week 1): Patient will verbally express basic wants/needs with 50% accuracy provided mod A verbal and visual cues  Skilled Therapeutic Interventions: Pt received from OT in wheelchair and agreeable to skilled ST intervention with focus on cognitive, speech, and swallowing goals. Daughter arrived during session. Pt was alert and expressed "good morning!" on SLP's arrival. PMSV was still in place from previous session and without visible signs of distress, vitals WFL, SP02 maintained 96-98% throughout session. Trach downsized to #4 shiley yesterday with no apparent complications. RT assessed during session and changed dressing. Pt with rough vocal quality and reduced vocal intensity consistent with previous encounter. Pt was oriented to person only and required field of 2-3 choices to orient to other concepts. Pt was easily distractible to  internal/external stimuli with focused attention approximately 1 minute intervals. Pt made some sexually inappropriate comments this date. Daughter expressed he has been known to be inappropriate toward male staff. Behaviors consistent with [place] level V. Pt performed oral care with max A for thoroughness prior to consumption of therapeutic PO trials. Pt consumed honey thick liquids by spoon x4 and only displayed swallow initiation during 50% of trials. Pt exhibited delayed throat clear and unable to initiate volitional swallow despite max A cues. Oral phase deficits likely exacerbated by limited focused attention and fluctuating alertness. PO trials halted for safety due to inconsistent swallow response. Continue NPO status with alternate form of nutrition. Continue PO trials with ST after oral care with honey thick liquids, pureed textures, or ice chips as tolerated. Pt was transferred to bed with +2 assist and mod A verbal/visual cues. PMSV was doffed. Patient was left in bed with alarm activated and immediate needs within reach at end of session. Continue per current plan of care.     Pain none  Agitated Behavior Scale: TBI Observation Details Observation Environment: [place] Start of observation period - Date: 01/21/21 Start of observation period - Time: 0800 End of observation period - Date: 01/21/21 End of observation period - Time: 0845 Agitated Behavior Scale (DO NOT LEAVE BLANKS) Short attention span, easy distractibility, inability to concentrate: Present to an moderate degree Impulsive, impatient, low tolerance for pain or frustration: Present to a slight degree Uncooperative, resistant to care, demanding: present to a slight degree Violent and/or threatening violence toward people or property: Absent Explosive and/or unpredictable anger: Absent Rocking, rubbing, moaning, or other self-stimulating behavior: Absent Pulling at tubes, restraints, etc.: Absent Wandering from treatment  areas:  Absent Restlessness, pacing, excessive movement: Present to a moderate degree Repetitive behaviors, motor, and/or verbal: Absent Rapid, loud, or excessive talking: present to a slight degree Sudden changes of mood: Absent Easily initiated or excessive crying and/or laughter: Absent Self-abusiveness, physical and/or verbal: Absent Agitated behavior scale total score: 21  Therapy/Group: Individual Therapy  Patty Sermons 01/21/2021, 3:54 PM

## 2021-01-21 NOTE — Progress Notes (Signed)
Physical Therapy TBI Note  Patient Details  Name: Scott Scott MRN: [id number] Date of Birth: 01/03/1961  Today's Date: 01/21/2021 PT Individual Time: 1020-1115 PT Individual Time Calculation (min): 55 min   Short Term Goals: Week 1:  PT Short Term Goal 1 (Week 1): Patient will perform bed mobility with mod A of 1 person. PT Short Term Goal 2 (Week 1): Patient will perform sit to stand with mod A using LRAD. PT Short Term Goal 3 (Week 1): Patient will initiate functional transfers with mod A. PT Short Term Goal 4 (Week 1): Patient with ambulate 10 ft using LRAD with mod A. PT Short Term Goal 5 (Week 1): Patient will attend to the L with mod cues >50% of the time.  Skilled Therapeutic Interventions/Progress Updates:     Patient in bed with RN and his daughter in the Scott upon PT arrival. Patient alert and agreeable to PT session. Patient denied pain during session. RN left NG tube unhooked for therapy session. PMSV placed and patient tolerated session on RA with SPO2 >92%.   Patient incontinent of urine at beginning of session. Focused session on sitting balance and functional mobility during peri-care and dressing followed by gait training for improved endurance and increased independence with functional mobility. Patient continues to have L inattention, worse with increased stimulation in the environment and with fatigue, verbalized and displayed inappropriate comments/touching x4 during session, easily redirected when behavior ignored or hands redirected. Continues to progress with mobility with improved use of L lower extremity with functional tasks throughout session.   Therapeutic Activity: Bed Mobility: Patient performed supine to/from sit with mod-max A. Provided verbal cues for rolling-throughout side-lying and use of bottom arm for trunk control. Transfers: Patient performed sit to/from stand x2 and stand pivot x1 with min a +2 B HHA and stand pivot x1 with mod-max A x1. Provided  verbal cues for initiation, forward weight shift, and knee/hip/trunk extension, noted improved L hemi-body extension in standing this session.  Gait Training:  Patient ambulated 180 feet using Swedish walker with min A +2 for guiding AD with PT providing facilitation of weight shifting. Ambulated with narrow BOS with increased adductor tone on L, decreased L knee and hip extension in stance, decreased R weight shift, decreased B step height and length L>R, and mod cuing for attention to task in a low-mod stimulation environment (around Scott Scott and Scott Scott). Provided multimodal cues and facilitation for initiation, weight shifting, increased step length, increased gait speed and propulsion, and increased BOS.  Neuromuscular Re-ed: Patient performed the following sitting/standing balance and L attention activities: -Sat EOB >6 min to doff/donn t-shirt and thread lower extremities through pants, attempted simple commands to complete task automatically without success, patient with little engagement with activity even after using hand-over-hand assist for backwards chaining. Needed total A for L upper extremity despite max multimodal cues for L attention.  -Stood >2 min with min A +2 as peri-care and lower body clothing management were performed by 2 additional people to assist as PT cued midline orientation and L hip/trunk elongation  -Patient sat with a window at his L side with PT also sitting on his L side with sustained L gaze >10 sec x2 with max cuing and continuous L side stimulation  Patient in bed with his daughter in the Scott at end of session with breaks locked, bed alarm set, and all needs within reach.   Therapy Documentation Precautions:  Precautions Precautions: Fall Precaution Comments: Scott Scott,  Lt hemiparesis Restrictions Weight Bearing Restrictions: No Agitated Behavior Scale: TBI Observation Details Observation Environment: [place] Start of observation period -  Date: 01/21/21 Start of observation period - Time: 69 End of observation period - Date: 01/21/21 End of observation period - Time: 1115 Agitated Behavior Scale (DO NOT LEAVE BLANKS) Short attention span, easy distractibility, inability to concentrate: Present to a moderate degree Impulsive, impatient, low tolerance for pain or frustration: Present to a slight degree Uncooperative, resistant to care, demanding: Absent Violent and/or threatening violence toward people or property: Absent Explosive and/or unpredictable anger: Absent Rocking, rubbing, moaning, or other self-stimulating behavior: Absent Pulling at tubes, restraints, etc.: Absent Wandering from treatment areas: Absent Restlessness, pacing, excessive movement: Present to a slight degree Repetitive behaviors, motor, and/or verbal: Present to a slight degree Rapid, loud, or excessive talking: Absent Sudden changes of mood: Absent Easily initiated or excessive crying and/or laughter: Absent Self-abusiveness, physical and/or verbal: Absent Agitated behavior scale total score: 19    Therapy/Group: Individual Therapy  Cherrie Franca L Damarion Mendizabal PT, DPT  01/21/2021, 12:38 PM

## 2021-01-21 NOTE — Progress Notes (Signed)
Occupational Therapy TBI Note  Patient Details  Name: Scott Day MRN: [id number] Date of Birth: 1960/08/01  Today's Date: 01/21/2021 OT Individual Time: 1315-1410 OT Individual Time Calculation (min): 55 min    Short Term Goals: Week 1:  OT Short Term Goal 1 (Week 1): Pt will complete sit<stand during LB ADL with 1 assist OT Short Term Goal 2 (Week 1): Pt will scan to midline with mod cues to locate needed ADL or therapeutic item OT Short Term Goal 3 (Week 1): Pt will exhibit increased awareness by indicating to OT that he needs to use the restroom x2 sessions  Skilled Therapeutic Interventions/Progress Updates:    Pt received supine with his daughter [name] present, no indications of pain. Pt restless in bed, rolling R and L frequently. Pt with R gaze and head turn. During session, multimodal cueing used for command following- scanning to midline and L especially successful with frequent, loud auditory stimuli. Pt completed rolling R and L on command with CGA, mod cueing for max A peri hygiene and brief change d/t urinary incontinence. Pt was able to was anterior peri area with HOH initiation. Max cueing and facilitation for initiation to come EOB, but min-mod A overall. Once EOB he required intermittent CGA-min A for sitting balance dynamically. Pt with more colorful language this session, inappropriate however not necessarily directed toward staff. Pt internally distracted by wanting ice cream throughout session. Sit > stand with min A and mod A +2 for safety to pull up pants in standing- pt able to initiate this with only min cueing. Ambulatory transfer with mod +2 HHA to the TIS w/c with tactile and verbal cueing for spatial orientation and proprioception. Pt completed oral care with the suction toothbrush with mod A for thoroughness while RN administered medication via NG. Pt was taken to the therapy gym via TIS dependently. Therapeutic activity focused on automatic, reciprocal movement,  midline and L attention and BUE coordination/L UE NMR. Pt was able to catch/throw ball with MAX auditory cueing for visual attention to OT in midline, and then progressively more into the L quadrant. Pt externally distracted and requiring skilled intervention in environmental management to decrease distracting stimuli. Pt was returned to his room and he transferred back to bed with mod +2 HHA. Pt was left supine with all needs met- trach collar on 5L 28% FiO2, NG tube running, and bed alarm on.   Therapy Documentation Precautions:  Precautions Precautions: Fall Precaution Comments: [name], Lt hemiparesis Restrictions Weight Bearing Restrictions: No  Agitated Behavior Scale: TBI Observation Details Observation Environment: [place] Start of observation period - Date: 01/21/21 Start of observation period - Time: 1315 End of observation period - Date: 01/21/21 End of observation period - Time: 1410 Agitated Behavior Scale (DO NOT LEAVE BLANKS) Short attention span, easy distractibility, inability to concentrate: Present to an extreme degree Impulsive, impatient, low tolerance for pain or frustration: Present to a slight degree Uncooperative, resistant to care, demanding: Absent Violent and/or threatening violence toward people or property: Absent Explosive and/or unpredictable anger: Absent Rocking, rubbing, moaning, or other self-stimulating behavior: Absent Pulling at tubes, restraints, etc.: Absent Wandering from treatment areas: Absent Restlessness, pacing, excessive movement: Present to a moderate degree Repetitive behaviors, motor, and/or verbal: Absent Rapid, loud, or excessive talking: Absent Sudden changes of mood: Absent Easily initiated or excessive crying and/or laughter: Absent Self-abusiveness, physical and/or verbal: Absent Agitated behavior scale total score: 20     Therapy/Group: Individual Therapy  Curtis Sites 01/21/2021,  2:19 PM
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
## 2021-01-21 NOTE — Progress Notes (Signed)
Occupational Therapy TBI Note  Patient Details  Name: Scott Day MRN: [id number] Date of Birth: [birth year]  Today's Date: 01/21/2021 OT Individual Time: 0700-0800 OT Individual Time Calculation (min): 60 min    Short Term Goals: Week 1:  OT Short Term Goal 1 (Week 1): Pt will complete sit<stand during LB ADL with 1 assist OT Short Term Goal 2 (Week 1): Pt will scan to midline with mod cues to locate needed ADL or therapeutic item OT Short Term Goal 3 (Week 1): Pt will exhibit increased awareness by indicating to OT that he needs to use the restroom x2 sessions  Skilled Therapeutic Interventions/Progress Updates:    Pt received in bed very restless throughout session. Pt able to move all 4 limbs but demos significant L neglect. Focus of session on BADL performance at bed level for LB and UB at sink seated in w/c. Overall pt requires MAX A for BADL performance with multimodal cuing for L attention. Pt completes rolling R with S and increased time to process commands and L with MAX facilitation for head control/reaching with LUE. Pt completes SPT to R with MOD A +2 present for safety. Pt with 1 instance of innapropriate behavior touching +2 chest stating, "they're beautiful" Pt redirected and when asked, "Do we touch people in we dont know in their chest." He states, "No, sorry." Pt completes seated visual scanning with max multimodal cuing to locate therapist on L and take bean bag to hand to +2 on R. Pt unable to select correct color when given a field of 2. Exited session with pt seated in bed, exit alarm on and call light in reach   Therapy Documentation Precautions:  Precautions Precautions: Fall Precaution Comments: [name], Lt hemiparesis Restrictions Weight Bearing Restrictions: No General:   Agitated Behavior Scale: TBI  Observation Details Observation Environment: [place] Start of observation period - Date: 01/21/21 Start of observation period - Time: 0700 End of  observation period - Date: 01/21/21 End of observation period - Time: 0800 Agitated Behavior Scale (DO NOT LEAVE BLANKS) Short attention span, easy distractibility, inability to concentrate: Present to an extreme degree Impulsive, impatient, low tolerance for pain or frustration: Absent Uncooperative, resistant to care, demanding: Absent Violent and/or threatening violence toward people or property: Absent Explosive and/or unpredictable anger: Absent Rocking, rubbing, moaning, or other self-stimulating behavior: Absent Pulling at tubes, restraints, etc.: Present to a slight degree Wandering from treatment areas: Absent Restlessness, pacing, excessive movement: Present to a moderate degree Repetitive behaviors, motor, and/or verbal: Present to a moderate degree Rapid, loud, or excessive talking: Absent Sudden changes of mood: Absent Easily initiated or excessive crying and/or laughter: Absent Self-abusiveness, physical and/or verbal: Absent Agitated behavior scale total score: 22    Therapy/Group: Individual Therapy  [name] 01/21/2021, 4:49 PM

## 2021-01-22 DIAGNOSIS — R1312 Dysphagia, oropharyngeal phase: Secondary | ICD-10-CM | POA: Diagnosis not present

## 2021-01-22 DIAGNOSIS — I10 Essential (primary) hypertension: Secondary | ICD-10-CM | POA: Diagnosis not present

## 2021-01-22 DIAGNOSIS — Z930 Tracheostomy status: Secondary | ICD-10-CM | POA: Diagnosis not present

## 2021-01-22 DIAGNOSIS — S069X2S Unspecified intracranial injury with loss of consciousness of 31 minutes to 59 minutes, sequela: Secondary | ICD-10-CM | POA: Diagnosis not present

## 2021-01-22 LAB — GLUCOSE, CAPILLARY
Glucose-Capillary: 158 mg/dL — ABNORMAL HIGH (ref 70–99)
Glucose-Capillary: 190 mg/dL — ABNORMAL HIGH (ref 70–99)
Glucose-Capillary: 141 mg/dL — ABNORMAL HIGH (ref 70–99)
Glucose-Capillary: 119 mg/dL — ABNORMAL HIGH (ref 70–99)
Glucose-Capillary: 114 mg/dL — ABNORMAL HIGH (ref 70–99)

## 2021-01-22 MED ORDER — METHYLPHENIDATE HCL 5 MG PO TABS
10.0000 mg | ORAL_TABLET | Freq: Two times a day (BID) | ORAL | Status: DC
  Administered 2021-01-22 – 2021-01-27 (×10): 10 mg via ORAL
  Filled 2021-01-22 (×10): qty 2

## 2021-01-22 MED ORDER — QUETIAPINE FUMARATE 50 MG PO TABS
50.0000 mg | ORAL_TABLET | Freq: Every day | ORAL | Status: DC
Start: 2021-01-22 — End: 2021-01-29
  Administered 2021-01-22 – 2021-01-28 (×7): 50 mg
  Filled 2021-01-22 (×7): qty 1

## 2021-01-22 NOTE — Progress Notes (Signed)
RT NOTE: patient capped per MD order.  Patient tolerating well at this time with sats of 92% on room air.  Will continue to monitor.

## 2021-01-22 NOTE — Progress Notes (Signed)
Occupational Therapy TBI Note  Patient Details  Name: Scott Day MRN: [id number] Date of Birth: [birth year]  Today's Date: 01/22/2021 OT Individual Time: 0702-0801 OT Individual Time Calculation (min): 59 min    Short Term Goals: Week 1:  OT Short Term Goal 1 (Week 1): Pt will complete sit<stand during LB ADL with 1 assist OT Short Term Goal 2 (Week 1): Pt will scan to midline with mod cues to locate needed ADL or therapeutic item OT Short Term Goal 3 (Week 1): Pt will exhibit increased awareness by indicating to OT that he needs to use the restroom x2 sessions  Skilled Therapeutic Interventions/Progress Updates:  Pt received poorly positioned in bed - scooted towards foot of bed with restless movement to R and L. Pt still with R head gaze and turn, but noted with better orientation to the L side in response to stimuli. Multimodal cuing used throughout session for command following, orientation to midline and L side and focused attn. Pt completed rolling on command with CGA, and required MOD A to come from side lying to sitting EOB to bring distal LE off bed. Stand > pivot MOD A + 2 for safety to TIS to R with increased time for processing & initiation to motor plan. Pt able to pull pants up with min cuing and OT threading pants distally. OTS facilitates bathing and dressing at sink level in TIS. Pt required max multimodal cuing to attend to task, stating "I'm cold" multiple times. Pt able to lift R arm on command and reach to L with increased facilitation to reach with RUE to L. Pt with poor orientation to midline but responsive to name during session, often replying "ok" to cues but without motor initiation to task. Pt returned to bed SPT MOD A + 2, requiring increased time for processing. OT provides wedge for proper bed positioning with trunk alignment and uses additional pillows for support.   Pt left at end of session in bed with exit belt alarm on, NG tube running, trach collar on 5L 28%  FiO2, mitt on R hand,call light in reach and all needs met     Therapy Documentation Precautions:  Precautions Precautions: Fall Precaution Comments: [name], Lt hemiparesis Restrictions Weight Bearing Restrictions: No Agitated Behavior Scale: TBI Observation Details Observation Environment: Pts room Start of observation period - Date: 01/22/21 Start of observation period - Time: 0700 End of observation period - Date: 01/22/21 End of observation period - Time: 0800 Agitated Behavior Scale (DO NOT LEAVE BLANKS) Short attention span, easy distractibility, inability to concentrate: Present to an extreme degree Impulsive, impatient, low tolerance for pain or frustration: Present to a slight degree Uncooperative, resistant to care, demanding: Absent Violent and/or threatening violence toward people or property: Absent Explosive and/or unpredictable anger: Absent Rocking, rubbing, moaning, or other self-stimulating behavior: Present to a slight degree Pulling at tubes, restraints, etc.: Present to a slight degree Wandering from treatment areas: Absent Restlessness, pacing, excessive movement: Present to a moderate degree Repetitive behaviors, motor, and/or verbal: Present to a slight degree Rapid, loud, or excessive talking: Absent Sudden changes of mood: Absent Easily initiated or excessive crying and/or laughter: Absent Self-abusiveness, physical and/or verbal: Absent Agitated behavior scale total score: 23   Therapy/Group: Individual Therapy  Chase Arnall 01/22/2021, 8:32 AM
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
## 2021-01-22 NOTE — Progress Notes (Signed)
Physical Therapy TBI Note  Patient Details  Name: Scott Day MRN: [id number] Date of Birth: Jul 02, 1960  Today's Date: 01/22/2021 PT Individual Time: 0900-1000 and 1445-1540 PT Individual Time Calculation (min): 60 min and 55 min   Short Term Goals: Week 1:  PT Short Term Goal 1 (Week 1): Patient will perform bed mobility with mod A of 1 person. PT Short Term Goal 2 (Week 1): Patient will perform sit to stand with mod A using LRAD. PT Short Term Goal 3 (Week 1): Patient will initiate functional transfers with mod A. PT Short Term Goal 4 (Week 1): Patient with ambulate 10 ft using LRAD with mod A. PT Short Term Goal 5 (Week 1): Patient will attend to the L with mod cues >50% of the time.  Skilled Therapeutic Interventions/Progress Updates:     Session 1: Patient in bed with his daughter in the room upon PT arrival. Patient alert and agreeable to PT session. Patient verbally denied pain and showed no outward signs of pain during session.  Patient's tube feed unhooked and spilling on patient and the floor with trach collar soiled upon PT arrival. RN made aware and replaced tube feed tips and floor cleaned. Patient also incontinent of bladder. Focused initial part of session on partial bathing and dressing with sitting/standing balance demonstrating ability to following simple commands for a familiar task 25-50% of the time with increased time for initiation.  RN left NG tube unhooked for therapy session. PMSV placed and patient tolerated session on RA with SPO2 >92%.   Therapeutic Activity: Bed Mobility: Patient performed supine to/from sit with mod A to initiate and min A to complete. Provided verbal cues for rolling-throughout side-lying and use of bottom arm for trunk control. Transfers: Patient performed sit to/from stand x2 and stand pivot x1 with min A +2 B HHA and stand pivot x1 with mod-min A of 1 person and second person SBA. Provided verbal cues for initiation, forward weight  shift, and knee/hip/trunk extension, noted improved L hemi-body extension in standing this session.   Gait Training:  Patient ambulated >100 feet x2 with 180 or 90 degree turns using B HHA min A +2 for. Ambulated with improved BOS with continued mild adductor tone on L, decreased L knee and hip extension in stance, decreased R weight shift, decreased B step height and length L>R, and min cuing for attention to task, mod on turns, in a low-mod stimulation environment. Provided multimodal cues and facilitation for initiation, weight shifting, increased step length, increased gait speed and propulsion, and increased BOS.   Neuromuscular Re-ed: Patient performed the following sitting/standing balance and L attention activities: -Sat EOB >6 min to doff/donn t-shirt and thread lower extremities through pants, attempted simple commands to complete task automatically without success, patient with little engagement with activity even after using hand-over-hand assist for backwards chaining. Needed max A for L upper extremity with max multimodal cues for L attention.  -Stood >2 min with min A +2 as peri-care and lower body clothing management were performed by 2 additional people to assist as PT cued midline orientation and L hip/trunk elongation  -Patient sat with a window at his L side with PT also sitting on his L side with sustained L gaze >10 sec x4 with max cuing and continuous L side stimulation  Patient in bed at end of session with breaks locked, bed alarm set, and all needs within reach. PMSV removed, however, trach collar left off at this time due to soiling  from tube feed, RN aware.   Session 2: Patient in bed with his girlfriend in the room upon PT arrival. Patient alert and agreeable to PT session. Patient denied pain during session. Trach now capped and patient tolerated well on RA with SPO2 >90% throughout session.   Focused on gait and stair training with attention to automatic and reciprocal  stepping pattern and tolerance of increased environmental stimulation. Noted improved volitional control of R upper and lower extremity with automatic functional tasks.   Therapeutic Activity: Bed Mobility: Patient performed rolling R L in the bed with mod I using the rails due to increased restlessness in the bed. He performed supine to/from sit as above. Threaded lower extremities through pants with max A sitting EOB. Transfers: Patient performed stand pivot bed>w/c and sit to/from stand x3 with min A of 1-2 with second person SBA when not assisting. Provided verbal cues for initiation and scooting back in the chair after returning to sitting for safety. Patient able to scoot back independently with mod-max cues for initiation.   Gait Training:  Patient ambulated 164 feet with 2x180 deg turns and >100 feet using B hands on therapist's shoulders and second person SBA with min A initially, progressed to B HHA with min A +2 to block peripheral vision with increased environmental stimulation due to increased external distractibility. Ambulated as described above. Patient ascended/descended 4x6" steps using B rails with min A +2 and mod-max a for L hand placement/advancement on rail. Performed step-to gait pattern leading with R while ascending and L while descending. Provided min cues for technique and sequencing and max cues for turning at the top of the steps.   Patient performed beach ball toss in sitting, managing 4 tosses back and fourth in >5 min due to decreased attention to task, caught the ball 1/4 trials.  Patient tolerated >20 min in mod-high stimulation environment in main therapy gym without escalation of behaviors, however required increased cues for attention to each task.   Patient in bed with his girl friend in the room at end of session with breaks locked, bed alarm set, and all needs within reach. RN made aware to reconnect tube feed. Behavior plan reviewed with RN and placed on  patient's bathroom door for staff reference.   Therapy Documentation Precautions:  Precautions Precautions: Fall Precaution Comments: Scott Day, Lt hemiparesis Restrictions Weight Bearing Restrictions: No Agitated Behavior Scale: TBI Observation Details Observation Environment: CIR Start of observation period - Date: 01/22/21 Start of observation period - Time: 0900 End of observation period - Date: 01/22/21 End of observation period - Time: 1000 Agitated Behavior Scale (DO NOT LEAVE BLANKS) Short attention span, easy distractibility, inability to concentrate: Present to a moderate degree Impulsive, impatient, low tolerance for pain or frustration: Present to a slight degree Uncooperative, resistant to care, demanding: Absent Violent and/or threatening violence toward people or property: Absent Explosive and/or unpredictable anger: Absent Rocking, rubbing, moaning, or other self-stimulating behavior: Absent Pulling at tubes, restraints, etc.: Present to a slight degree Wandering from treatment areas: Absent Restlessness, pacing, excessive movement: Present to a moderate degree Repetitive behaviors, motor, and/or verbal: Present to a slight degree Rapid, loud, or excessive talking: Absent Sudden changes of mood: Absent Easily initiated or excessive crying and/or laughter: Absent Self-abusiveness, physical and/or verbal: Absent Agitated behavior scale total score: 21 Agitated Behavior Scale: TBI Observation Details Observation Environment: CIR Start of observation period - Date: 01/22/21 Start of observation period - Time: 1445 End of observation period - Date:  01/22/21 End of observation period - Time: 1540 Agitated Behavior Scale (DO NOT LEAVE BLANKS) Short attention span, easy distractibility, inability to concentrate: Present to a moderate degree Impulsive, impatient, low tolerance for pain or frustration: Present to a slight degree Uncooperative, resistant to care,  demanding: Absent Violent and/or threatening violence toward people or property: Absent Explosive and/or unpredictable anger: Absent Rocking, rubbing, moaning, or other self-stimulating behavior: Absent Pulling at tubes, restraints, etc.: Present to a slight degree Wandering from treatment areas: Absent Restlessness, pacing, excessive movement: Present to a moderate degree Repetitive behaviors, motor, and/or verbal: Present to a slight degree Rapid, loud, or excessive talking: Absent Sudden changes of mood: Absent Easily initiated or excessive crying and/or laughter: Absent Self-abusiveness, physical and/or verbal: Absent Agitated behavior scale total score: 21    Therapy/Group: Individual Therapy  Ellery Tash L Breia Ocampo PT, DPT  01/22/2021, 4:07 PM

## 2021-01-22 NOTE — Progress Notes (Signed)
Patient ID: Scott Day, male   DOB: 05-03-1960, 60 y.o.   MRN: [id number]  SW returned phone call to [facility] CM [phone]) to discuss [facility] referral. Through discussion, reported pt should still be eligible for TBI waiver and CAP/DA despite having managed Medicaid plan. SW will explore options.  SW spoke with [facility] CAP/DA [phone]) program to submit referral.   SW spoke with Paul/[facility] [phone]) to discuss TBI waiver. He reported he spoke with a care coordination rep and states pt will need to change his insurance to traditional Medicaid in order to be eligible for program. He provided contact information for Jennifer/Care Coordinator [phone]). SW left message for Anderson Malta to discuss further.   SW spoke with pt dtr Scott Day on above with regard to above services. Reports if pt is eligible for TBI program, she would like to switch his insurance to traditional Medicaid. Also reports, she needs intermittent FMLA.   SW resubmitted forms and faxed to employer.  [name], MSW, [place] Office: [phone] Cell: [phone] Fax: [phone]

## 2021-01-23 DIAGNOSIS — Z930 Tracheostomy status: Secondary | ICD-10-CM | POA: Diagnosis not present

## 2021-01-23 DIAGNOSIS — I10 Essential (primary) hypertension: Secondary | ICD-10-CM | POA: Diagnosis not present

## 2021-01-23 DIAGNOSIS — R1312 Dysphagia, oropharyngeal phase: Secondary | ICD-10-CM | POA: Diagnosis not present

## 2021-01-23 DIAGNOSIS — S069X2S Unspecified intracranial injury with loss of consciousness of 31 minutes to 59 minutes, sequela: Secondary | ICD-10-CM | POA: Diagnosis not present

## 2021-01-23 LAB — GLUCOSE, CAPILLARY
Glucose-Capillary: 110 mg/dL — ABNORMAL HIGH (ref 70–99)
Glucose-Capillary: 124 mg/dL — ABNORMAL HIGH (ref 70–99)
Glucose-Capillary: 126 mg/dL — ABNORMAL HIGH (ref 70–99)
Glucose-Capillary: 127 mg/dL — ABNORMAL HIGH (ref 70–99)
Glucose-Capillary: 137 mg/dL — ABNORMAL HIGH (ref 70–99)
Glucose-Capillary: 137 mg/dL — ABNORMAL HIGH (ref 70–99)
Glucose-Capillary: 165 mg/dL — ABNORMAL HIGH (ref 70–99)
Glucose-Capillary: 187 mg/dL — ABNORMAL HIGH (ref 70–99)
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
## 2021-01-23 NOTE — Progress Notes (Signed)
Pt was in hallway being taken to therapy.  Therapist reports HR 90's, sat 95% on room air and capped trach

## 2021-01-23 NOTE — Progress Notes (Addendum)
Physical Therapy Session Note  Patient Details  Name: Scott Day MRN: [id number] Date of Birth: [birth year]  Today's Date: 01/23/2021 PT Individual Time: 1000-1053 and 1300-1400 PT Individual Time Calculation (min): 53 min and 60 min  Short Term Goals: Week 1:  PT Short Term Goal 1 (Week 1): Patient will perform bed mobility with mod A of 1 person. PT Short Term Goal 2 (Week 1): Patient will perform sit to stand with mod A using LRAD. PT Short Term Goal 3 (Week 1): Patient will initiate functional transfers with mod A. PT Short Term Goal 4 (Week 1): Patient with ambulate 10 ft using LRAD with mod A. PT Short Term Goal 5 (Week 1): Patient will attend to the L with mod cues >50% of the time. Week 2:    Week 3:     Skilled Therapeutic Interventions/Progress Updates:  AM session: Pt unable to state pain level, denies pain, no outward signs of pain during session.  RN left NG tube unhooked for therapy session. PMSV placed and patient tolerated session on RA with SPO2 >92% HR 88-105 w/gait/activity.    Pt initially oob in wc, sleeping but easily awakened for session.  Pt inappriate at times, propositions therapist and curses profusely but not agitated.  Pt transported to low stimulation hallway for session w/focus on increased intensity gait training.   Gait 249ft x 3, 150x 1 initially w/handhold heavy min assist fading to HHA of 1 and min assist /guarding for assist when distracted/of second person, pt frequently distracted by R environment/pictures on wall, doorways, inner distractions causing pt to turn head/shoulders/full body at times to R resulting in decreased stability.  W/forward progression pt demonstrates increasing upright stability over distance when attending to task.  Pt requires max cues to scan to L, locate objects to L, make eye contact w/therapist on L, worked on this constantly during gait.  Sit to stand from mat/recliner/wc/bed requires varying assist d/t attention to  task, from min to mod assist/multimodal cues.   Pt incontinent of bowel during session.  Gait training performed to room.  Turn/sit to bed w/max cues and + 2 min assist.  Pt unable to focus adequately to scoot in sitting.  Sit to stand w/mod cues/assist.  Sidestepping to L w/heavy cueing, mod assist.  Sit to supine to L w/max cues/pt attempts to lie down to R/redirected.  Overall mod assist for transition d/t cognition/attention.  Pt able to partially bridge w/multimodal cues but cannot attend to removing pants, total assist for this.  Pt handed off to NT for furhter care as session timed out.      PM SESSION: Pt initally resting supine.  Dons pants w/mod assist overall, follows simple commands for threading feet and raising pants, needs assist to complete on L.  Supine to side to sit w/mult failed attempts d/t distraction during task.  Daughter in room and instructed to remain out of view/limit verbalizations to allow pt to focus on task. Pt eventually rolls L and L side to sit w/mod assist d/t decreased ability to utilize LUE w/pushing up to sitting. Pt then dons shoes w/min assist but mult attempts again d/t distraction/self distraction during task.  Does use bilat Ues and crosses legs L/R and R/L during task.  Additional cues to complete task w/L but does complete. Pt c/o need for BM.   Sit to stand and gait to BR w/mod assist, occasionally leans L/incomplete step length L, max cues to scan and locate toilet to L, overall  mod assist for transfer for safety.  Pt continent of B&B.  Able to stand w/min assist from commode when attending to task.  Pt able to perform pericare in semisquat w/min assist for balance, set up assist w/washcloth (drops into toilet despite cues).  Returns to sitting.  Repeats this x 3 then therapist assists to ensure adequate cleaning.   Pt stands and raises brief and pants w/min assist.  Gait to sink w/overall + 1 mod assist assist, cues to turn fullty to sink.  Pt then able  to wash hands w/simple cues for each step and to locate soap and paper towels during task. Backs and sits to wc w/max cues, mod assist. Pt transported to gym.   Stairs: Ascended/descended 8 3in and 4 6 in steps x 2 w/cues to attend to L hand advancement although does progress partially/automatically at times.  1x fails to step w/L foot and ascends 2 steps on R before backing and correcting w/max cues, overall mod assist, additional time, max cues to attend to task d/t inner distractions.  Seated bimanual task of screwing and unscrewing large bolt from bolt box, max cues to attend to task, facilitate use of L hand w/task, only utilizes as stabilizer regardless of orientation of bolt and uses intermittently.   Gait carrying lid of boltbox w/bilat ues w/verbal/tactile cues for L UE consistent use, min assist of 2 59ft x 1, carrying blue bolster x 50ft w/min assist however pt transfers bolster under R arm to avoid using bilat hands.  Pt transported back to room.  Pt left oob in wc w/alarm belt set and needs in reach Pt continues w/intermittent sexually inappropriate comments during session but easily redirected, reoriented to therapist role/situation.  Pt not aggressive w/comments or behavior.     Therapy Documentation Precautions:  Precautions Precautions: Fall Precaution Comments: [name], Lt hemiparesis Restrictions Weight Bearing Restrictions: No    Therapy/Group: Individual Therapy Callie Fielding, [place] 01/23/2021, 11:39 AM
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
## 2021-01-23 NOTE — Progress Notes (Signed)
Occupational Therapy TBI Note  Patient Details  Name: Scott Day MRN: [id number] Date of Birth: May 25, 1960  Today's Date: 01/23/2021 OT Individual Time: 1950-9326 OT Individual Time Calculation (min): 54 min   Session 2: OT Individual Time: 1415-1500 OT Individual Time Calculation (min): 45 min    Short Term Goals: Week 1:  OT Short Term Goal 1 (Week 1): Pt will complete sit<stand during LB ADL with 1 assist OT Short Term Goal 2 (Week 1): Pt will scan to midline with mod cues to locate needed ADL or therapeutic item OT Short Term Goal 3 (Week 1): Pt will exhibit increased awareness by indicating to OT that he needs to use the restroom x2 sessions  Skilled Therapeutic Interventions/Progress Updates:    Pt received supine with no indications of pain, his daughter arriving shortly after session started. Trach capped, pt's VSS throughout session. Pt's speech more intelligible this session, however also with an increase in inappropriate verbalizations/comments to OT. Redirection successful throughout session. Session focused on ADLs EOB and in TIS w/c- multimodal cueing required for initiation, motor planning, attention to L and biomechanics throughout. Pt with great improvement in L attention, scanning to the L to find OT spontaneously and less cueing overall for ADLs.UB dressing required only mod A and max A for UB dressing- however with good improvement. LB dressing with mod A +2 required for sit > stand safety. Pt was able to stand with min A however required +2 for safety d/t impulsively and inconsistently following directions. Pt was taken to the therapy gym following ADLs where he completed BUE ergometer with min-mod facilitation required to keep his L hand grasped on the handle both d/t weakness and inattention. Overall improved focused attention to task. Pt was returned to his room and left sitting up in the TIS w/c with all needs met, daughter present.   Session 2:  Pt received in w/c  with no indications of pain, dtr Scott Day present. Pt was taken via TIS w/c to the therapy gym. He completed 3 trials of standing level functional reaching task with focus on sustained attention using the BITS. Pt required mod cueing for L attention and for attention to task overall- he was both internally and externally distracted but demonstrated improvement in L scanning. He was able to sustain attention to task for 29 and 49 seconds for 1st and 2nd trial, then 3rd trial reported need to have BM. He was taken back to his room where he completed ambulatory transfer to the toilet, with max cueing for L attention in more busy environment. He was able to initiate toileting tasks with mod A, however still impulsive and requiring heavy cueing/assist. Pt had no void but reported he was finished. He was able to complete hygiene in standing with min +2 balance assist. He required max cueing for attention to task when washing hands. Pt completed 150 ft of functional mobility with min-mod +2 assist. Pt frequently with inappropriate comments toward OT, which were both redirected and discussed what is appropriate vs not appropriate to say to staff. Pt returned to his room and was left supine with all needs met. NG tube connected and running, trach capped. ABS: 21   Therapy Documentation Precautions:  Precautions Precautions: Fall Precaution Comments: Scott Day, Lt hemiparesis Restrictions Weight Bearing Restrictions: No    Agitated Behavior Scale: TBI Observation Details Observation Environment: CIR Start of observation period - Date: 01/23/21 Start of observation period - Time: 0830 End of observation period - Date: 01/23/21 End  of observation period - Time: 0930 Agitated Behavior Scale (DO NOT LEAVE BLANKS) Short attention span, easy distractibility, inability to concentrate: Present to a moderate degree Impulsive, impatient, low tolerance for pain or frustration: Present to a slight  degree Uncooperative, resistant to care, demanding: Absent Violent and/or threatening violence toward people or property: Absent Explosive and/or unpredictable anger: Absent Rocking, rubbing, moaning, or other self-stimulating behavior: Absent Pulling at tubes, restraints, etc.: Absent Wandering from treatment areas: Absent Restlessness, pacing, excessive movement: Present to a moderate degree Repetitive behaviors, motor, and/or verbal: Present to a moderate degree Rapid, loud, or excessive talking: Absent Sudden changes of mood: Absent Easily initiated or excessive crying and/or laughter: Absent Self-abusiveness, physical and/or verbal: Absent Agitated behavior scale total score: 21   Therapy/Group: Individual Therapy  Scott Day 01/23/2021, 12:31 PM

## 2021-01-24 DIAGNOSIS — R3915 Urgency of urination: Secondary | ICD-10-CM

## 2021-01-24 DIAGNOSIS — E871 Hypo-osmolality and hyponatremia: Secondary | ICD-10-CM

## 2021-01-24 DIAGNOSIS — R1312 Dysphagia, oropharyngeal phase: Secondary | ICD-10-CM | POA: Diagnosis not present

## 2021-01-24 DIAGNOSIS — S069X2S Unspecified intracranial injury with loss of consciousness of 31 minutes to 59 minutes, sequela: Secondary | ICD-10-CM | POA: Diagnosis not present

## 2021-01-24 LAB — GLUCOSE, CAPILLARY
Glucose-Capillary: 172 mg/dL — ABNORMAL HIGH (ref 70–99)
Glucose-Capillary: 159 mg/dL — ABNORMAL HIGH (ref 70–99)
Glucose-Capillary: 125 mg/dL — ABNORMAL HIGH (ref 70–99)
Glucose-Capillary: 120 mg/dL — ABNORMAL HIGH (ref 70–99)
Glucose-Capillary: 154 mg/dL — ABNORMAL HIGH (ref 70–99)

## 2021-01-24 NOTE — Progress Notes (Signed)
Occupational Therapy TBI Note  Patient Details  Name: Scott Day MRN: [id number] Date of Birth: 11-15-1960  Today's Date: 01/24/2021 OT Individual Time: 4818-5631 OT Individual Time Calculation (min): 56 min    Short Term Goals: Week 1:  OT Short Term Goal 1 (Week 1): Pt will complete sit<stand during LB ADL with 1 assist OT Short Term Goal 2 (Week 1): Pt will scan to midline with mod cues to locate needed ADL or therapeutic item OT Short Term Goal 3 (Week 1): Pt will exhibit increased awareness by indicating to OT that he needs to use the restroom x2 sessions  Skilled Therapeutic Interventions/Progress Updates:     Pt received in bed with no pain reported. Later at end of session reporting nausea. LPN alerted and VS assessed stable WNL. But returned to bed because of nausea HOB >30* ADL: Pt completes ADL at overall MAX Level. Skilled interventions include: tactile cueing for initiation of lifting Les to thread into pants. Improved timing noted this date with less delay. Pt completes sit to stand with MIN A and increased time to process/execute cue and OT advances pants past hips with +2 present for safety. Pt stand pivot transfer to w/c with CGA for balance to R. Pt able ot locate comb at sink seated in TIS with multimodal cuing on L. Pt perseverative on combing beard and comb needs to be removed to allow for pt to attend to UB dressing. Total A for UB dressing d/t distraction with looking at self in mirror at midline and leaning elbows onto sink.   Therapeutic activity Attempted to engage pt in cornhole activity seated with pt throwing 3 bean bags prior to stating, "lets go around the corner" after other people came into tx space. Pt attempted to hit stimuli on Bits stating, "dont fuck with me" when couldn't locate large circle on L of screen. With [facility] A pt able to reach for stimulus and visually attend. Pt perseverative on innapropriate comments such as "kiss me" and attempting to touch  OT pants. Redirection provided.   Pt left at end of session in bed, wedge placed under hips to improve  with exit alarm on, call light in reach and all needs met.   Therapy Documentation Precautions:  Precautions Precautions: Fall Precaution Comments: [name], Lt hemiparesis Restrictions Weight Bearing Restrictions: No Agitated Behavior Scale: TBI  Observation Details Observation Environment: CIR Start of observation period - Date: 01/24/21 Start of observation period - Time: 0900 End of observation period - Date: 01/24/21 End of observation period - Time: 0956 Agitated Behavior Scale (DO NOT LEAVE BLANKS) Short attention span, easy distractibility, inability to concentrate: Present to an extreme degree Impulsive, impatient, low tolerance for pain or frustration: Present to a moderate degree Uncooperative, resistant to care, demanding: Present to a slight degree Violent and/or threatening violence toward people or property: Absent Explosive and/or unpredictable anger: Absent Rocking, rubbing, moaning, or other self-stimulating behavior: Absent Pulling at tubes, restraints, etc.: Absent Wandering from treatment areas: Absent Restlessness, pacing, excessive movement: Present to a moderate degree Repetitive behaviors, motor, and/or verbal: Present to a slight degree Rapid, loud, or excessive talking: Absent Sudden changes of mood: Absent Easily initiated or excessive crying and/or laughter: Absent Self-abusiveness, physical and/or verbal: Absent Agitated behavior scale total score: 23   Therapy/Group: Individual Therapy  [name] 01/24/2021, 9:57 AM

## 2021-01-24 NOTE — Progress Notes (Signed)
Physical Therapy Session Note  Patient Details  Name: Scott Day MRN: [id number] Date of Birth: [birth year]  Today's Date: 01/24/2021 PT Individual Time: 1400-1500 PT Individual Time Calculation (min): 60 min   Short Term Goals: Week 1:  PT Short Term Goal 1 (Week 1): Patient will perform bed mobility with mod A of 1 person. PT Short Term Goal 2 (Week 1): Patient will perform sit to stand with mod A using LRAD. PT Short Term Goal 3 (Week 1): Patient will initiate functional transfers with mod A. PT Short Term Goal 4 (Week 1): Patient with ambulate 10 ft using LRAD with mod A. PT Short Term Goal 5 (Week 1): Patient will attend to the L with mod cues >50% of the time.  Skilled Therapeutic Interventions/Progress Updates:   Pt received supine in bed noted to have been incontinent of bowels. Rolling R and L with max cues for awareness of the L side to perform pericare from PT. Dressing lower body with max cues for attention to task in supine without success. Supine>sit with mod assist to pull on PT to bring trunk to sitting. Pt able to don shoes with min assist sitting EOB. Stand pivot transfer to and from [facility] with min assist and max cues for attention to task. Pt transported to orthogym in [place]. Gait training with HHA on the L side with CGA intermittently on the RUE for balance. Min-mod assist overall to prevent L lateral LOB and improved weight shift to the R to increase step length on the LLE x 165f. Pt reports increased fatigue and requesting to return to bed following gait. Pt returned to room and performed stand pivot transfer to bed with mod assist on the L side. Sit>supine completed with min assist. PTs donned posey waist belt and mitten to the RUE.  and left supine in bed with call bell in reach and all needs met.       Therapy Documentation Precautions:  Precautions Precautions: Fall Precaution Comments: Trach, cortrak, Lt hemiparesis Restrictions Weight Bearing Restrictions:  No  Vital Signs: Therapy Vitals Pulse Rate: 86 Resp: 18 Patient Position (if appropriate): Lying Oxygen Therapy SpO2: 97 % O2 Device: Room Air Pain: Pain Assessment Pain Scale: Faces Faces Pain Scale: No hurt  Therapy/Group: Individual Therapy  ALorie Phenix11/07/2020, 5:59 PM
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
## 2021-01-25 DIAGNOSIS — R3915 Urgency of urination: Secondary | ICD-10-CM | POA: Diagnosis not present

## 2021-01-25 DIAGNOSIS — S069X2S Unspecified intracranial injury with loss of consciousness of 31 minutes to 59 minutes, sequela: Secondary | ICD-10-CM | POA: Diagnosis not present

## 2021-01-25 DIAGNOSIS — E871 Hypo-osmolality and hyponatremia: Secondary | ICD-10-CM | POA: Diagnosis not present

## 2021-01-25 DIAGNOSIS — R1312 Dysphagia, oropharyngeal phase: Secondary | ICD-10-CM | POA: Diagnosis not present

## 2021-01-25 LAB — GLUCOSE, CAPILLARY
Glucose-Capillary: 112 mg/dL — ABNORMAL HIGH (ref 70–99)
Glucose-Capillary: 123 mg/dL — ABNORMAL HIGH (ref 70–99)
Glucose-Capillary: 145 mg/dL — ABNORMAL HIGH (ref 70–99)
Glucose-Capillary: 146 mg/dL — ABNORMAL HIGH (ref 70–99)
Glucose-Capillary: 149 mg/dL — ABNORMAL HIGH (ref 70–99)
Glucose-Capillary: 153 mg/dL — ABNORMAL HIGH (ref 70–99)

## 2021-01-25 MED ORDER — LORAZEPAM 0.5 MG PO TABS
0.5000 mg | ORAL_TABLET | Freq: Four times a day (QID) | ORAL | Status: DC | PRN
  Administered 2021-01-26 – 2021-01-27 (×3): 0.5 mg
  Filled 2021-01-25 (×4): qty 1

## 2021-01-25 NOTE — Progress Notes (Deleted)
This nurse notified by NT about patient malposition on arrival patient lying in bed with head at foot of bead and feet at [facility] pt repositioned appropriately in bed and cleaned of incontinent stool type 7. New orders obtained for Bilateral soft wrist restraints and lorazepam 0.5 mg Q6 hrs PRN. Restraints applied with adverse event family notified
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
## 2021-01-25 NOTE — Progress Notes (Signed)
This nurse notified by NT about patient malposition on arrival patient lying in bed with head at foot of bed and feet at [facility] pt repositioned appropriately in bed and cleaned of incontinent stool type 7. New orders obtained for Bilateral soft wrist restraints and lorazepam 0.5 mg Q6 hrs PRN. Restraints applied with no adverse event family notified

## 2021-01-26 DIAGNOSIS — I10 Essential (primary) hypertension: Secondary | ICD-10-CM | POA: Diagnosis not present

## 2021-01-26 DIAGNOSIS — Z930 Tracheostomy status: Secondary | ICD-10-CM | POA: Diagnosis not present

## 2021-01-26 DIAGNOSIS — R1312 Dysphagia, oropharyngeal phase: Secondary | ICD-10-CM | POA: Diagnosis not present

## 2021-01-26 DIAGNOSIS — S069X2S Unspecified intracranial injury with loss of consciousness of 31 minutes to 59 minutes, sequela: Secondary | ICD-10-CM | POA: Diagnosis not present

## 2021-01-26 LAB — CBC
HCT: 35.8 % — ABNORMAL LOW (ref 39.0–52.0)
Hemoglobin: 11.8 g/dL — ABNORMAL LOW (ref 13.0–17.0)
MCH: 30.2 pg (ref 26.0–34.0)
MCHC: 33 g/dL (ref 30.0–36.0)
MCV: 91.6 fL (ref 80.0–100.0)
Platelets: 340 10*3/uL (ref 150–400)
RBC: 3.91 MIL/uL — ABNORMAL LOW (ref 4.22–5.81)
RDW: 11.9 % (ref 11.5–15.5)
WBC: 9.9 10*3/uL (ref 4.0–10.5)
nRBC: 0 % (ref 0.0–0.2)

## 2021-01-26 LAB — COMPREHENSIVE METABOLIC PANEL
ALT: 53 U/L — ABNORMAL HIGH (ref 0–44)
AST: 24 U/L (ref 15–41)
Albumin: 3.2 g/dL — ABNORMAL LOW (ref 3.5–5.0)
Alkaline Phosphatase: 87 U/L (ref 38–126)
Anion gap: 8 (ref 5–15)
BUN: 10 mg/dL (ref 6–20)
CO2: 29 mmol/L (ref 22–32)
Calcium: 9.5 mg/dL (ref 8.9–10.3)
Chloride: 97 mmol/L — ABNORMAL LOW (ref 98–111)
Creatinine, Ser: 0.81 mg/dL (ref 0.61–1.24)
GFR, Estimated: >60 mL/min (ref >60)
Glucose, Bld: 158 mg/dL — ABNORMAL HIGH (ref 70–99)
Potassium: 4.3 mmol/L (ref 3.5–5.1)
Sodium: 134 mmol/L — ABNORMAL LOW (ref 135–145)
Total Bilirubin: 0.6 mg/dL (ref 0.3–1.2)
Total Protein: 7.4 g/dL (ref 6.5–8.1)

## 2021-01-26 LAB — GLUCOSE, CAPILLARY
Glucose-Capillary: 117 mg/dL — ABNORMAL HIGH (ref 70–99)
Glucose-Capillary: 136 mg/dL — ABNORMAL HIGH (ref 70–99)
Glucose-Capillary: 138 mg/dL — ABNORMAL HIGH (ref 70–99)
Glucose-Capillary: 144 mg/dL — ABNORMAL HIGH (ref 70–99)
Glucose-Capillary: 149 mg/dL — ABNORMAL HIGH (ref 70–99)
Glucose-Capillary: 152 mg/dL — ABNORMAL HIGH (ref 70–99)

## 2021-01-26 LAB — VALPROIC ACID LEVEL: Valproic Acid Lvl: 19 ug/mL — ABNORMAL LOW (ref 50.0–100.0)

## 2021-01-26 MED ORDER — VALPROIC ACID 250 MG/5ML PO SOLN
500.0000 mg | Freq: Two times a day (BID) | ORAL | Status: DC
  Administered 2021-01-26 – 2021-01-29 (×6): 500 mg
  Filled 2021-01-26 (×6): qty 10

## 2021-01-26 NOTE — Progress Notes (Signed)
Occupational Therapy TBI Note  Patient Details  Name: Scott Day MRN: [id number] Date of Birth: 02-26-1961  Today's Date: 01/26/2021 OT Individual Time: 1106-1200 OT Individual Time Calculation (min): 54 min    Short Term Goals: Week 1:  OT Short Term Goal 1 (Week 1): Pt will complete sit<stand during LB ADL with 1 assist OT Short Term Goal 2 (Week 1): Pt will scan to midline with mod cues to locate needed ADL or therapeutic item OT Short Term Goal 3 (Week 1): Pt will exhibit increased awareness by indicating to OT that he needs to use the restroom x2 sessions  Skilled Therapeutic Interventions/Progress Updates:    Pt received supine with no c/o pain at rest, his daughter and PT reporting pt was dizzy and not feeling well earlier in the day. Temperature checked as pt looked somewhat flushed and was 99.1. Pt was agreeable to come EOB with mod HHA. All VSS throughout session. Pt required mod multimodal cueing throughout session to initiate motor plans and follow commands. He initiated ambulatory transfer into the bathroom and then reported need to urinate, but was unable to make it to bathroom and urinated in his incontinence brief. He continued transfer into the bathroom and sat on toilet with min HHA for transfer. He completed UB ADLs seated on the toilet with mod A and mod cueing for L attention- good improvement! Mod A for toileting tasks and donning pants. He returned to his TIS w/c with min HHA. Mod A for oral hygiene thoroughness with suction toothbrush. Pt was left siting up in the w/c with all needs met, respiratory therapist present to remove trach. Chair alarm belt fastened.   Therapy Documentation Precautions:  Precautions Precautions: Fall Precaution Comments: [name], Lt hemiparesis Restrictions Weight Bearing Restrictions: No    Agitated Behavior Scale: TBI Observation Details Observation Environment: pt room Start of observation period - Date: 01/26/21 Start of  observation period - Time: 1100 End of observation period - Date: 01/26/21 End of observation period - Time: 1200 Agitated Behavior Scale (DO NOT LEAVE BLANKS) Short attention span, easy distractibility, inability to concentrate: Present to a moderate degree Impulsive, impatient, low tolerance for pain or frustration: Present to a slight degree Uncooperative, resistant to care, demanding: Absent Violent and/or threatening violence toward people or property: Absent Explosive and/or unpredictable anger: Absent Rocking, rubbing, moaning, or other self-stimulating behavior: Present to a slight degree Pulling at tubes, restraints, etc.: Absent Wandering from treatment areas: Absent Restlessness, pacing, excessive movement: Present to a slight degree Repetitive behaviors, motor, and/or verbal: Present to a slight degree Rapid, loud, or excessive talking: Absent Sudden changes of mood: Absent Easily initiated or excessive crying and/or laughter: Absent Self-abusiveness, physical and/or verbal: Absent Agitated behavior scale total score: 20    Therapy/Group: Individual Therapy  Curtis Sites 01/26/2021, 12:50 PM

## 2021-01-26 NOTE — Progress Notes (Signed)
Physical Therapy TBI Note  Patient Details  Name: Scott Day MRN: [id number] Date of Birth: 09/02/1960  Today's Date: 01/26/2021 PT Individual Time: 0800-0827 PT Individual Time Calculation (min): 27 min   Short Term Goals: Week 1:  PT Short Term Goal 1 (Week 1): Patient will perform bed mobility with mod A of 1 person. PT Short Term Goal 2 (Week 1): Patient will perform sit to stand with mod A using LRAD. PT Short Term Goal 3 (Week 1): Patient will initiate functional transfers with mod A. PT Short Term Goal 4 (Week 1): Patient with ambulate 10 ft using LRAD with mod A. PT Short Term Goal 5 (Week 1): Patient will attend to the L with mod cues >50% of the time.  Skilled Therapeutic Interventions/Progress Updates:    Patient received in bed, asleep, but easy to wake. Agreeable to PT. He denies pain when asked. PT and tech doffing wrist restraints and patient able to come sit edge of bed with Max cues and MinA. Patient with R gaze preference away from PT, but would scan to L to acknowledge PT at times throughout session. Patient washing face with washcloth and Mod cues. Static sitting balance maintained edge of bed with CGA. Patient able to thread B LE through pants with ModA and stand with MinA x2 to don pants completely. Patient with difficulty motor planning lateral steps and lateral scoots to HOB. He returned supine with ModA and PT + tech boosted patient to [facility]. Wrist restraints initially reapplied, however, per MD, wrist restraints okay to be off with dtr at bedside. Bed alarm on, 4 rails up, call light within reach, dtr at bedside.   Therapy Documentation Precautions:  Precautions Precautions: Fall Precaution Comments: [name], Lt hemiparesis Restrictions Weight Bearing Restrictions: No  Agitated Behavior Scale: TBI         Therapy/Group: Individual Therapy  Karoline Caldwell, PT, DPT, CBIS  01/26/2021, 7:44 AM

## 2021-01-26 NOTE — Progress Notes (Signed)
Speech Language Pathology Weekly Progress and Session Note  Patient Details  Name: Scott Day MRN: [id number] Date of Birth: 21-Aug-1960  Beginning of progress report period: January 19, 2021 End of progress report period: January 26, 2021  Today's Date: 01/26/2021 SLP Individual Time: 1300-1400 SLP Individual Time Calculation (min): 60 min  Short Term Goals: Week 1: SLP Short Term Goal 1 (Week 1): Patient will wear PMSV without O2 saturations dropping below 90% and without signs of distress throughout an entirety of a session SLP Short Term Goal 1 - Progress (Week 1): Met SLP Short Term Goal 2 (Week 1): Pt will increase sustained attention to functional tasks to 1-3 minutes providing mod-max A SLP Short Term Goal 2 - Progress (Week 1): Not met SLP Short Term Goal 3 (Week 1): Patient will demonstrate 75% speech intelligibility at the word level with Mod A verbal and visual cues for use of speech intelligibility strategies. SLP Short Term Goal 3 - Progress (Week 1): Met SLP Short Term Goal 4 (Week 1): Patient will answer personal, orientation and environmental yes/no questions with 75% accuracy and mod A verbal/visual cues SLP Short Term Goal 4 - Progress (Week 1): Met SLP Short Term Goal 5 (Week 1): Patient will consume therapeutic PO trials of puree/HTL textures with SLP only with minimal overt s/sx of aspiration and mod A verbal/visual cues SLP Short Term Goal 5 - Progress (Week 1): Met SLP Short Term Goal 6 (Week 1): Patient will verbally express basic wants/needs with 50% accuracy provided mod A verbal and visual cues SLP Short Term Goal 6 - Progress (Week 1): Met    New Short Term Goals: Week 2: SLP Short Term Goal 1 (Week 2): Patient will demonstrate orientation X 4 with Mod visual and verbal cues. SLP Short Term Goal 2 (Week 2): Patient will demonstrate sustained attention to tasks for 2 minutes with Mod verbal cues for redirection. SLP Short Term Goal 3 (Week 2): Patient will  demonstrate 75% speech intelligibility at the word level with Min A verbal and visual cues for use of speech intelligibility strategies. SLP Short Term Goal 4 (Week 2): Patient will consume therapeutic PO trials of puree/HTL textures with SLP only with minimal overt s/sx of aspiration and min A verbal/visual cues for use of swallowing compensatory strategies.  Weekly Progress Updates: Patient has made functional gains and has met 5 of 6 STGs this reporting period. Currently, patient remains NPO but is consuming trials of Dys. 1 textures with honey-thick liquids via tsp with minimal overt s/s of aspiration and Mod-Max A multimodal cues for attention to bolus. Recommend MBS this week to assess swallow function. Patient is now decannulated  and is ~75% intelligible with Mod verbal cues for use of speech intelligibility strategies. Patient demonstrates behaviors consistent with a Rancho Level V-VI and requires overall Mod-Max A multimodal cues to complete functional and familiar tasks safely in regards to problem solving, orientation, and attention. Patient continues with severe impairments in recall and safety with intermittent language of confusion noted. Patient and family education ongoing. Patient would benefit from continued skilled SLP intervention to maximize his cognitive and swallowing function as well as his speech intelligibility prior to discharge.      Intensity: Minumum of 1-2 x/day, 30 to 90 minutes Frequency: 3 to 5 out of 7 days Duration/Length of Stay: 3 weeks Treatment/Interventions: Cognitive remediation/compensation;Dysphagia/aspiration precaution training;Internal/external aids;Speech/Language facilitation;Therapeutic Activities;Environmental Environmental consultant;Therapeutic Exercise;Patient/family education;Multimodal communication approach;Functional tasks   Daily Session  Skilled Therapeutic Interventions:  Skilled  treatment session focused on dysphagia and cognitive goals.  Upon arrival, patient was asleep in bed but easily roused. Patient was independently oriented to place and city but required total A for orientation to month and situation. Patient requested coffee and ice cream. SLP provided Max verbal and tactile cues for problem solving via the suction toothbrush. Patient consumed ~2 oz of a magic cup and honey-thick coffee with minimal overt s/s of aspiration noted. Patient required overall mod verbal cues for attention to bolus with cues to "swallow hard." Recommend ongoing trials with SLP. Patient with intermittent sexually inappropriate language but was easily redirected. Patient also with language of confusion throughout but with increased intelligibility to ~75-90% with Min verbal cues for use of an increased rate. Patient requested to have a bowel movement and ambulated to the bathroom with +2 assist for safety. Patient continent of bowel. Patient left on commode with NT present. Continue with current plan of care.     Pain No/Denies Pain   Therapy/Group: Individual Therapy  Naida Escalante 01/26/2021, 7:26 AM
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
## 2021-01-26 NOTE — Progress Notes (Signed)
Physical Therapy Weekly Progress Note  Patient Details  Name: Scott Day MRN: [id number] Date of Birth: Oct 05, 1960  Beginning of progress report period: January 18, 2021 End of progress report period: January 26, 2021  Today's Date: 01/26/2021 PT Individual Time: 0902-1000 PT Individual Time Calculation (min): 58 min   Patient has met 3 of 4 short term goals.  Patient continues to requiring max cuing for L attention and visual scanning and mod cues for inappropriate behaviors and safety throughout sessions. Posey waist belt, B wrist restraints, and R mitten added this week due to increased restlessness placing patient at risk to remove trach or NG tube. He has made significant functional progress with mobility, performing bed mobility with min A, transfers min-mod A, gait min-mod A of 1-2 up to 200 feet, and stairs with min-mod A of 1-2. He is sitting up the TIS w/c >1 hour between therapy sessions consistently and his daughter has been participating in education intermittently during sessions.   Patient continues to demonstrate the following deficits decreased cardiorespiratoy endurance, motor apraxia, decreased coordination, and decreased motor planning, decreased midline orientation and decreased attention to left, decreased initiation, decreased attention, decreased awareness, decreased problem solving, decreased safety awareness, decreased memory, and delayed processing, and decreased sitting balance, decreased standing balance, decreased postural control, hemiplegia, decreased balance strategies, and difficulty maintaining precautions and therefore will continue to benefit from skilled PT intervention to increase functional independence with mobility.  Patient progressing toward long term goals..  Continue plan of care.  PT Short Term Goals Week 1:  PT Short Term Goal 1 (Week 1): Patient will perform bed mobility with mod A of 1 person. PT Short Term Goal 1 - Progress (Week 1): Met PT  Short Term Goal 2 (Week 1): Patient will perform sit to stand with mod A using LRAD. PT Short Term Goal 2 - Progress (Week 1): Met PT Short Term Goal 3 (Week 1): Patient will initiate functional transfers with mod A. PT Short Term Goal 3 - Progress (Week 1): Met PT Short Term Goal 4 (Week 1): Patient with ambulate 10 ft using LRAD with mod A. PT Short Term Goal 4 - Progress (Week 1): Met PT Short Term Goal 5 (Week 1): Patient will attend to the L with mod cues >50% of the time. PT Short Term Goal 5 - Progress (Week 1): Progressing toward goal Week 2:  PT Short Term Goal 1 (Week 2): Patient will perfom bed mobility with supervision consistently. PT Short Term Goal 2 (Week 2): Patient will perform basic transfers with min A of 1 person consistently. PT Short Term Goal 3 (Week 2): Patient will ambulate >100 feet with min A, 1 person assist. PT Short Term Goal 4 (Week 2): Patient will attend to the L with mod cues >50% of the time.  Skilled Therapeutic Interventions/Progress Updates:     Patient in bed asleep with a washcloth on his forehead with his daughter at bedside upon PT arrival. Patient's daughter reports that the patient did not sleep well, sleep chart confirms, and the patient has nausea this morning, RN premedicated patient. Patient slow to arouse to verbal and tactile cues and agreeable to PT session. Patient denied pain during session.  Patient reported dizziness in sitting and while ambulating during session, BP 115/78 and 120/82, HR 80's, SPO2 >94%.   Therapeutic Activity: Bed Mobility: Patient performed supine to/from sit with min A-CGA. Provided verbal cues for initiation and pushing up to sitting. Transfers: Patient performed sit to/from  stand x4 with min A. Provided verbal cues for initiation and hand placement. Patient appropriately reported that he needed to void. He performed an ambulatory transfer to/from the bathroom with min A of 1 and second person SBA for safety. Upon  entering the bathroom the patient pulled down and his pants and began to void while walking to the toilet, provided cues and facilitation to stand over the toilet. [employer] systems engineer with CGA-min A for balance while incontinence brief was changed and peri-care performed with total A. Patient sat in TIS w/c and shoes, socks, and paper scrubs changed with max A for energy/time management following washing lower legs for hygiene care.   Gait Training:  Patient ambulated 55 feet using R HHA with min A and SBA from a second person, patient stopped with eyes closing and trunk swaying, returned to sitting with mod-max A and reported dizziness upon questioning, see above. Ambulated with improved BOS with continued mild adductor tone on L, decreased L knee and hip extension in stance, decreased R weight shift, decreased B step height and length L>R, and min cuing for attention to task, mod on turns, in a low-mod stimulation environment. Provided multimodal cues and facilitation for initiation, weight shifting, increased step length, increased gait speed and propulsion, and increased BOS.  Patient in bed with is daughter at bedside at end of session with breaks locked, bed alarm set, and all needs within reach. Soft wrist restraints remained off due to continuous supervision from his daughter with RN permission.   Therapy Documentation Precautions:  Precautions Precautions: Fall Precaution Comments: [name], Lt hemiparesis Restrictions Weight Bearing Restrictions: No   Therapy/Group: Individual Therapy  Akeisha Lagerquist L Nashalie Sallis PT, DPT  01/26/2021, 12:36 PM

## 2021-01-27 DIAGNOSIS — S069X2S Unspecified intracranial injury with loss of consciousness of 31 minutes to 59 minutes, sequela: Secondary | ICD-10-CM | POA: Diagnosis not present

## 2021-01-27 DIAGNOSIS — I10 Essential (primary) hypertension: Secondary | ICD-10-CM | POA: Diagnosis not present

## 2021-01-27 DIAGNOSIS — Z930 Tracheostomy status: Secondary | ICD-10-CM | POA: Diagnosis not present

## 2021-01-27 DIAGNOSIS — R1312 Dysphagia, oropharyngeal phase: Secondary | ICD-10-CM | POA: Diagnosis not present

## 2021-01-27 LAB — CUP PACEART REMOTE DEVICE CHECK
Date Time Interrogation Session: 20221106215424
Implantable Pulse Generator Implant Date: 20210924

## 2021-01-27 LAB — GLUCOSE, CAPILLARY
Glucose-Capillary: 130 mg/dL — ABNORMAL HIGH (ref 70–99)
Glucose-Capillary: 140 mg/dL — ABNORMAL HIGH (ref 70–99)
Glucose-Capillary: 119 mg/dL — ABNORMAL HIGH (ref 70–99)
Glucose-Capillary: 131 mg/dL — ABNORMAL HIGH (ref 70–99)
Glucose-Capillary: 109 mg/dL — ABNORMAL HIGH (ref 70–99)

## 2021-01-27 MED ORDER — METHYLPHENIDATE HCL 5 MG PO TABS
10.0000 mg | ORAL_TABLET | Freq: Two times a day (BID) | ORAL | Status: DC
  Administered 2021-01-27 – 2021-01-29 (×4): 10 mg
  Filled 2021-01-27 (×4): qty 2

## 2021-01-27 NOTE — Progress Notes (Signed)
Speech Language Pathology TBI Note  Patient Details  Name: Scott Day MRN: [id number] Date of Birth: 02/25/1961  Today's Date: 01/27/2021 SLP Individual Time: 1255-1340 SLP Individual Time Calculation (min): 45 min  Short Term Goals: Week 2: SLP Short Term Goal 1 (Week 2): Patient will demonstrate orientation X 4 with Mod visual and verbal cues. SLP Short Term Goal 2 (Week 2): Patient will demonstrate sustained attention to tasks for 2 minutes with Mod verbal cues for redirection. SLP Short Term Goal 3 (Week 2): Patient will demonstrate 75% speech intelligibility at the word level with Min A verbal and visual cues for use of speech intelligibility strategies. SLP Short Term Goal 4 (Week 2): Patient will consume therapeutic PO trials of puree/HTL textures with SLP only with minimal overt s/sx of aspiration and min A verbal/visual cues for use of swallowing compensatory strategies.  Skilled Therapeutic Interventions: Skilled treatment session focused on dysphagia and cognitive goals. Upon arrival, patient was extremely restless in bed with sexually inappropriate language. Patient attempted to redirect the patient with minimal success, however, but responded best with direct feedback (you are making me uncomfortable, please stop saying those things to me). Patient sat EOB with overall Min A and Mod verbal cues and transferred to the wheelchair to maximize safe positioning and attention for PO intake. Patient consumed Dys. 1 textures without overt s/s of aspiration but demonstrated what appeared to be a delayed swallow initiation with intermittent verbal cues noted. Patient with consistent coughing noted today with honey-thick liquids via tsp, suspect due to poor attention to bolus. Recommend continued trials with SLP only with MBS scheduled for tomorrow. Patient requested to use the bathroom and ambulated to the commode with +2 for safety. Patient was continent of bladder. Patient reporting  intermittent dizziness and was returned to bed. Patient left upright in bed with daughter present. Overall, patient with decreased attention to tasks today, however, suspect due to being over stimulated by change in environment (new room) and having the television on prior to the start of the session.  Continue with current plan of care.      Pain Pain Assessment Pain Scale: 0-10 Pain Score: 4  Pain Location: Head Pain Intervention(s): Medication (See eMAR)  Agitated Behavior Scale: TBI Observation Details Observation Environment: Patient's room Start of observation period - Date: 01/27/21 Start of observation period - Time: 1255 End of observation period - Date: 01/27/21 End of observation period - Time: 1340 Agitated Behavior Scale (DO NOT LEAVE BLANKS) Short attention span, easy distractibility, inability to concentrate: Present to a slight degree Impulsive, impatient, low tolerance for pain or frustration: Present to a slight degree Uncooperative, resistant to care, demanding: Absent Violent and/or threatening violence toward people or property: Absent Explosive and/or unpredictable anger: Absent Rocking, rubbing, moaning, or other self-stimulating behavior: Present to a slight degree Pulling at tubes, restraints, etc.: Absent Wandering from treatment areas: Present to a slight degree Restlessness, pacing, excessive movement: Present to a slight degree Repetitive behaviors, motor, and/or verbal: Present to a slight degree Rapid, loud, or excessive talking: Present to a slight degree Sudden changes of mood: Absent Easily initiated or excessive crying and/or laughter: Absent Self-abusiveness, physical and/or verbal: Absent Agitated behavior scale total score: 21  Therapy/Group: Individual Therapy  Delesha Pohlman, [place] 01/27/2021, 1:49 PM

## 2021-01-27 NOTE — Plan of Care (Signed)
Goals upgraded 11/8 d/t pt progress  Problem: RH Balance Goal: LTG: Patient will maintain dynamic sitting balance (OT) Description: LTG:  Patient will maintain dynamic sitting balance with assistance during activities of daily living (OT) Flowsheets (Taken 01/27/2021 0807) LTG: Pt will maintain dynamic sitting balance during ADLs with: (upgraded 11/8 d/t pt progress- SD) Supervision/Verbal cueing   Problem: Sit to Stand Goal: LTG:  Patient will perform sit to stand in prep for activites of daily living with assistance level (OT) Description: LTG:  Patient will perform sit to stand in prep for activites of daily living with assistance level (OT) Flowsheets (Taken 01/27/2021 0807) LTG: PT will perform sit to stand in prep for activites of daily living with assistance level: (upgraded 11/8 d/t pt progress- SD) Contact Guard/Touching assist   Problem: RH Toilet Transfers Goal: LTG Patient will perform toilet transfers w/assist (OT) Description: LTG: Patient will perform toilet transfers with assist, with/without cues using equipment (OT) Flowsheets (Taken 01/27/2021 0807) LTG: Pt will perform toilet transfers with assistance level of: (upgraded 11/8 d/t pt progress- SD) Contact Guard/Touching assist

## 2021-01-27 NOTE — Patient Care Conference (Signed)
Inpatient RehabilitationTeam Conference and Plan of Care Update Date: 01/27/2021   Time: 10:09 AM    Patient Name: Scott Day      Medical Record Number: [id number]  Date of Birth: 1960/06/02 Sex: Male         Room/Bed: 4W17C/4W17C-01 Payor Info: Payor: [place] / Plan: [employer] MEDICAID [id number] / Product Type: *No Product type* /    Admit Date/Time:  01/17/2021 11:48 AM  Primary Diagnosis:  TBI (traumatic brain injury)  Hospital Problems: Principal Problem:   TBI (traumatic brain injury)    Expected Discharge Date: Expected Discharge Date: 02/16/21  Team Members Present: Physician leading conference: Dr. Alger Simons Social Worker Present: [name], [place] Nurse Present: Dorthula Nettles, RN PT Present: Apolinar Junes, PT OT Present: Laverle Hobby, OT SLP Present: Weston Anna, SLP PPS Coordinator present : Gunnar Fusi, SLP     Current Status/Progress Goal Weekly Team Focus  Bowel/Bladder   Incontient of B/B. LBM 01/26/21  Regain Continence      Swallow/Nutrition/ Hydration   NPO with NG tube, Trials of honey-thick liquids via tsp and puree textures  Functional change in oropharyngeal swallow, initiate a diet  MBS scheduled for 11/9 to assess swallow function, education, ongoing trials   ADL's   ADL overall mod A, L inattention but has improved since last week  supervision/CGA for transfers, min A ADLs  ADL training, attention, functional transfers   Mobility   Mod-min A overall, gait >200 feet, intermittent +2 assist for safety or w/c follow, L inattention improving  Upgraded to Supervision overall, gait >200 feet, 2 steps using LRAD  L attention, functional mobility, L hemi-body NMR, activity tolerance, stimulation tolerance, sitting tolerance, gait and stair training, attention and safety awareness, patient/caregiver education   Communication   Patient decannulated, Overall Mod A for speech intelligibility at the phrase level.   sup A  Verbal expression of wants/needs, appropriate social interactions, following commands   Safety/Cognition/ Behavioral Observations  [place] Level V: Max A  Min A  orientation, sustained attention, intellectual awareness   Pain   No complain of Pain  Remain Pain Free.  Assess Q shift and prn.   Skin   Trach Out. Minor Abraisions  Prevent New skin breakdowns.  Assess Q shift, and prn.     Discharge Planning:  Pt will d/c to home with intermittent support from his dtr and son. Dtr is working on 24/7 care for patient. Concerns with regard to [facility] services needed due to insurance. Seeing if pt is eligible for TBI waiver.   Team Discussion: Lurline Idol removed yesterday. Still dysarthric. Adjusting medications. Incontinent B/B. Incision to neck and left hand clean and intact. Daughter returned to work and will take intermittent FMLA. Having [place] challenges due to insurance. Supervision goals, min assist ADL's. Min/mod assist overall. Initiated stair training. Doing trials of honey thick liquids. Follow-up MBS tomorrow. Still having Language of Confusion. Patient on target to meet rehab goals: yes  *See Care Plan and progress notes for long and short-term goals.   Revisions to Treatment Plan:  Adjusting medications, MBS scheduled, trials of honey thick liquids, discontinued trach.  Teaching Needs: Family education, medication/pain management, skin/wound care, bowel/bladder management, safety awareness, transfers, etc.  Current Barriers to Discharge: Decreased caregiver support, Home enviroment access/layout, Incontinence, Wound care, Lack of/limited family support, and Medication compliance  Possible Resolutions to Barriers: Family education Family to provide 24/7 care assistance Check eligibility for TBI waiver     Medical Summary  Current Status: trach out. stoma closed. slept better last night. adjusting regimen for mood/sleep. still incontinent  Barriers to Discharge: Medical stability    Possible Resolutions to [employer] Focus: daily assessment of sleep/wake/concentration and other patient data, adjustments to regimen as needed   Continued Need for Acute Rehabilitation Level of Care: The patient requires daily medical management by a physician with specialized training in physical medicine and rehabilitation for the following reasons: Direction of a multidisciplinary physical rehabilitation program to maximize functional independence : Yes Medical management of patient stability for increased activity during participation in an intensive rehabilitation regime.: Yes Analysis of laboratory values and/or radiology reports with any subsequent need for medication adjustment and/or medical intervention. : Yes   I attest that I was present, lead the team conference, and concur with the assessment and plan of the team.   Cristi Loron 01/27/2021, 2:11 PM

## 2021-01-27 NOTE — Plan of Care (Signed)
Problem: RH Wheelchair Mobility Goal: LTG Patient will propel w/c in home environment (PT) Description: LTG: Patient will propel wheelchair in home environment, # of feet with assistance (PT). Outcome: Not Applicable Flowsheets (Taken 01/27/2021 [phone]) LTG: Pt will propel w/c in home environ  assist needed:: (d/c goal due to progress with functional ambulation) -- Note: d/c goal due to progress with functional ambulation   Problem: RH Balance Goal: LTG Patient will maintain dynamic standing balance (PT) Description: LTG:  Patient will maintain dynamic standing balance with assistance during mobility activities (PT) Flowsheets (Taken 01/27/2021 0611) LTG: Pt will maintain dynamic standing balance during mobility activities with:: (Upgraded goal due to patient's progress with mobility/balance and to reduce caregiver burden at d/c.) Supervision/Verbal cueing Note: Upgraded goal due to patient's progress with mobility/balance and to reduce caregiver burden at d/c.   Problem: Sit to Stand Goal: LTG:  Patient will perform sit to stand with assistance level (PT) Description: LTG:  Patient will perform sit to stand with assistance level (PT) Flowsheets (Taken 01/27/2021 0611) LTG: PT will perform sit to stand in preparation for functional mobility with assistance level: (Upgraded goal due to patient's progress with mobility/balance and to reduce caregiver burden at d/c.) Supervision/Verbal cueing Note: Upgraded goal due to patient's progress with mobility/balance and to reduce caregiver burden at d/c.   Problem: RH Bed Mobility Goal: LTG Patient will perform bed mobility with assist (PT) Description: LTG: Patient will perform bed mobility with assistance, with/without cues (PT). Flowsheets (Taken 01/27/2021 [phone]) LTG: Pt will perform bed mobility with assistance level of: (Upgraded goal due to patient's progress with mobility/balance and to reduce caregiver burden at d/c.) Supervision/Verbal  cueing Note: Upgraded goal due to patient's progress with mobility/balance and to reduce caregiver burden at d/c.   Problem: RH Bed to Chair Transfers Goal: LTG Patient will perform bed/chair transfers w/assist (PT) Description: LTG: Patient will perform bed to chair transfers with assistance (PT). Flowsheets (Taken 01/27/2021 [phone]) LTG: Pt will perform Bed to Chair Transfers with assistance level: (Upgraded goal due to patient's progress with mobility/balance and to reduce caregiver burden at d/c.) Supervision/Verbal cueing Note: Upgraded goal due to patient's progress with mobility/balance and to reduce caregiver burden at d/c.   Problem: RH Car Transfers Goal: LTG Patient will perform car transfers with assist (PT) Description: LTG: Patient will perform car transfers with assistance (PT). Flowsheets (Taken 01/27/2021 [phone]) LTG: Pt will perform car transfers with assist:: (Upgraded goal due to patient's progress with mobility/balance and to reduce caregiver burden at d/c.) Supervision/Verbal cueing Note: Upgraded goal due to patient's progress with mobility/balance and to reduce caregiver burden at d/c.   Problem: RH Ambulation Goal: LTG Patient will ambulate in controlled environment (PT) Description: LTG: Patient will ambulate in a controlled environment, # of feet with assistance (PT). Flowsheets (Taken 01/27/2021 [phone]) LTG: Pt will ambulate in controlled environ  assist needed:: (Upgraded goal due to patient's progress with mobility/balance and to reduce caregiver burden at d/c.) Supervision/Verbal cueing LTG: Ambulation distance in controlled environment: 200 ft Note: Upgraded goal due to patient's progress with mobility/balance and to reduce caregiver burden at d/c. Goal: LTG Patient will ambulate in home environment (PT) Description: LTG: Patient will ambulate in home environment, # of feet with assistance (PT). Flowsheets (Taken 01/27/2021 [phone]) LTG: Pt will ambulate in home environ   assist needed:: (Upgraded goal due to patient's progress with mobility/balance and to reduce caregiver burden at d/c.) Supervision/Verbal cueing LTG: Ambulation distance in home environment: 50 ft using LRAD Note:  Upgraded goal due to patient's progress with mobility/balance and to reduce caregiver burden at d/c.   Problem: RH Stairs Goal: LTG Patient will ambulate up and down stairs w/assist (PT) Description: LTG: Patient will ambulate up and down # of stairs with assistance (PT) Flowsheets Taken 01/27/2021 0611 LTG: Pt will ambulate up/down stairs assist needed:: (Upgraded goal due to patient's progress with mobility/balance and to reduce caregiver burden at d/c.) Contact Guard/Touching assist Taken 01/18/2021 2007 LTG: Pt will  ambulate up and down number of stairs: 2 using LRAD Note: Upgraded goal due to patient's progress with mobility/balance and to reduce caregiver burden at d/c.

## 2021-01-27 NOTE — Progress Notes (Signed)
Occupational Therapy Session Note  Patient Details  Name: Scott Day MRN: [id number] Date of Birth: Nov 23, 1960  Today's Date: 01/27/2021 OT Individual Time: 6812-7517 OT Individual Time Calculation (min): 45 min    Short Term Goals: Week 1:  OT Short Term Goal 1 (Week 1): Pt will complete sit<stand during LB ADL with 1 assist OT Short Term Goal 2 (Week 1): Pt will scan to midline with mod cues to locate needed ADL or therapeutic item OT Short Term Goal 3 (Week 1): Pt will exhibit increased awareness by indicating to OT that he needs to use the restroom x2 sessions  Skilled Therapeutic Interventions/Progress Updates:    Patient seated in w/c, he denies pain, family member present t/o session.  He is easily distracted by environment on right side but able to follow directions for stand pivot transfer w/c to/from mat table with CGA, maintain sitting edge of mat for 30 minutes with various activities to increase attention to left side, use of left hand, bimanual tasks - seated posture good/upright with right rotation and attention to right, able to hold and reach to atleast 90 with left hand.  Completed standing checker board activity - poor attention to directions but able to reach with tactile cues.  He demonstrates inappropriate language but is easily redirected t/o session.  Returned to w/c at close of session, chair tilted, seat belt alarm set, O2 sat 95+% t/o session, HR in the 70s.    Therapy Documentation Precautions:  Precautions Precautions: Fall Precaution Comments: [name], Lt hemiparesis Restrictions Weight Bearing Restrictions: No   Therapy/Group: Individual Therapy  [name] 01/27/2021, 7:30 AM

## 2021-01-27 NOTE — Progress Notes (Signed)
Physical Therapy TBI Note  Patient Details  Name: Scott Day MRN: [id number] Date of Birth: 09/02/1960  Today's Date: 01/27/2021 PT Individual Time: 1435-1530 PT Individual Time Calculation (min): 55 min   Short Term Goals: Week 2:  PT Short Term Goal 1 (Week 2): Patient will perfom bed mobility with supervision consistently. PT Short Term Goal 2 (Week 2): Patient will perform basic transfers with min A of 1 person consistently. PT Short Term Goal 3 (Week 2): Patient will ambulate >100 feet with min A, 1 person assist. PT Short Term Goal 4 (Week 2): Patient will attend to the L with mod cues >50% of the time.  Skilled Therapeutic Interventions/Progress Updates:     Patient in bed moving toward the edge and restless with his daughter in the room preparing to leave upon PT arrival. Patient alert and agreeable to PT session. Patient denied pain during session. Patient's daughter reports the patient was hyperverbal and restless following moving rooms for improved L stimulation with L window. Educated on overstimulation with increased environmental activity.   Provided utilized therapeutic use of self to set a calm environment and demeanor for patient to mirror behavior with success after 3-4 min. Patient then agreeable to sit up EOB and continue with therapy session. Continues to demonstrate frequent inappropriate behaviors and grabbing during session, easily redirected with providing another place to put the patient's hand or changing the subject without acknowledgement of comments.   Therapeutic Activity: Bed Mobility: Patient performed supine to/from sit with min A for trunk support. Provided max verbal cues for initiation. Transfers: Patient performed sit to/from stand x2 and stand pivot bed>TIS w/c with min A HHA. Provided verbal cues for initiation and stabilization in standing.  Gait Training:  Patient ambulated >50 feet and >100 feet using HHA with min-mod A. Ambulated with narrow  BOS, decreased gait speed, step height and length, decreased L visual scanning, and very externally distracted on the R. Provided HHA on L with poor attention to cues, switch to HHA on R on second trial with improved attention and use of therapist to block visual distractions. Provided verbal cues for initiation, navigation, increased BOS, and visual scanning. Utilized +2 as visual target in front of patient to improve visual scanning.  Neuromuscular Re-ed: Patient performed the following motor control activities for improved L visual scanning and attention and balance: -sitting in TIS w/c looking out the window to the L in a quiet environment, patient able to sustain gaze to the L 5-10 sec today x3, able to identify trees and blinds  -standing ball toss focused on incorporation of L hand and reaching L with min A for balance >3 min -sitting balance EOB >4 min with focus on reaching with L hand contralaterally progressing to ipsilaterally with visual scanning to the L  Patient in bed with B soft wrist restraints secured at end of session with breaks locked, bed alarm set, Telesitter in place, and all needs within reach.   Therapy Documentation Precautions:  Precautions Precautions: Fall Precaution Comments: Trach, cortrak, Lt hemiparesis Restrictions Weight Bearing Restrictions: No Agitated Behavior Scale: TBI Observation Details Observation Environment: CIR Start of observation period - Date: 01/27/21 Start of observation period - Time: 1435 End of observation period - Date: 01/27/21 End of observation period - Time: 1530 Agitated Behavior Scale (DO NOT LEAVE BLANKS) Short attention span, easy distractibility, inability to concentrate: Present to a moderate degree Impulsive, impatient, low tolerance for pain or frustration: Absent Uncooperative, resistant to care, demanding: Present  to a slight degree Violent and/or threatening violence toward people or property: Absent Explosive  and/or unpredictable anger: Absent Rocking, rubbing, moaning, or other self-stimulating behavior: Present to a slight degree Pulling at tubes, restraints, etc.: Present to a slight degree Wandering from treatment areas: Absent Restlessness, pacing, excessive movement: Present to a moderate degree Repetitive behaviors, motor, and/or verbal: Present to a moderate degree Rapid, loud, or excessive talking: Absent Sudden changes of mood: Absent Easily initiated or excessive crying and/or laughter: Absent Self-abusiveness, physical and/or verbal: Absent Agitated behavior scale total score: 23    Therapy/Group: Individual Therapy  Scott Day PT, DPT  01/27/2021, 6:08 PM

## 2021-01-27 NOTE — Progress Notes (Signed)
Occupational Therapy Weekly Progress Note  Patient Details  Name: Scott Day MRN: [id number] Date of Birth: [birth year]  Beginning of progress report period: 01/18/21 End of progress report period: 01/27/2021  Today's Date: 01/27/2021 OT Individual Time: 3383-2919 OT Individual Time Calculation (min): 45 min    Patient has met 3 of 3 short term goals.  Scott Day has made excellent progress his first week in rehab. He initially required max multimodal cueing to scan to midline and is now able to spontaneously scan to the midline and L. He still requires mod-max A for ADLs however his initiation, motor planning, attention, and L inattention has improved tremendously. His LUE has also improved and he is able to use it intentionally and functionally during ADLs, with 90 degrees of shoulder flexion, full elbow flex/ext, and 3-/5 grasp. Formal family education has not yet been scheduled but pt has a very supportive and attentive daughter who is frequently present.   Patient continues to demonstrate the following deficits: muscle weakness, decreased coordination and decreased motor planning, decreased midline orientation, decreased attention to left, and decreased motor planning, decreased initiation, decreased attention, decreased awareness, decreased problem solving, decreased safety awareness, decreased memory, and delayed processing, and decreased sitting balance, decreased standing balance, decreased postural control, hemiplegia, and decreased balance strategies and therefore will continue to benefit from skilled OT intervention to enhance overall performance with BADL and Reduce care partner burden.  Patient progressing toward long term goals..  Plan of care revisions: Several goals upgraded to reflect pt progress.  OT Short Term Goals Week 1:  OT Short Term Goal 1 (Week 1): Pt will complete sit<stand during LB ADL with 1 assist OT Short Term Goal 1 - Progress (Week 1): Met OT Short Term Goal 2  (Week 1): Pt will scan to midline with mod cues to locate needed ADL or therapeutic item OT Short Term Goal 2 - Progress (Week 1): Met OT Short Term Goal 3 (Week 1): Pt will exhibit increased awareness by indicating to OT that he needs to use the restroom x2 sessions OT Short Term Goal 3 - Progress (Week 1): Met Week 2:  OT Short Term Goal 1 (Week 2): Pt will improve motor planning demo-ed by donning shirt with mod A OT Short Term Goal 2 (Week 2): Pt will require no more than mod cueing for sustained attention to ADL task for 1 min OT Short Term Goal 3 (Week 2): Pt will complete toileting tasks with min A  Skilled Therapeutic Interventions/Progress Updates:    Pt supine with no c/o pain. All VSS- trach decannulation yesterday. Pt required heavy cueing for initiation this morning, max physical assist and mod cueing. Once EOB pt more alert, maintaining sitting balance with (S) overall, despite intermittent efforts to return to supine. Pt completed UB bathing with mod cueing for L body attention. Shirt and pants donned with mod A.  Increased time provided throughout session to allow processing and motor planning. Improvement in L spontaneous use throughout session and adherence to cueing. Pt required one supine rest break despite OT efforts to keep him upright. Pt then required min A for stand pivot transfer to the TIS w/c with all needs met. NG tube connected, chair alarm set. Visitor in room.   Therapy Documentation Precautions:  Precautions Precautions: Fall Precaution Comments: Scott Day, Lt hemiparesis Restrictions Weight Bearing Restrictions: No   Therapy/Group: Individual Therapy  Scott Day 01/27/2021, 8:00 AM
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
## 2021-01-28 ENCOUNTER — Inpatient Hospital Stay (HOSPITAL_COMMUNITY): Payer: Medicaid Other

## 2021-01-28 LAB — GLUCOSE, CAPILLARY
Glucose-Capillary: 142 mg/dL — ABNORMAL HIGH (ref 70–99)
Glucose-Capillary: 120 mg/dL — ABNORMAL HIGH (ref 70–99)
Glucose-Capillary: 118 mg/dL — ABNORMAL HIGH (ref 70–99)
Glucose-Capillary: 175 mg/dL — ABNORMAL HIGH (ref 70–99)
Glucose-Capillary: 140 mg/dL — ABNORMAL HIGH (ref 70–99)
Glucose-Capillary: 67 mg/dL — ABNORMAL LOW (ref 70–99)
Glucose-Capillary: 175 mg/dL — ABNORMAL HIGH (ref 70–99)

## 2021-01-28 LAB — MRSA NEXT GEN BY PCR, NASAL: MRSA by PCR Next Gen: DETECTED — AB

## 2021-01-28 MED ORDER — JEVITY 1.5 CAL/FIBER PO LIQD
980.0000 mL | ORAL | Status: DC
  Filled 2021-01-28: qty 1000

## 2021-01-28 MED ORDER — ENSURE ENLIVE PO LIQD
237.0000 mL | Freq: Two times a day (BID) | ORAL | Status: DC
  Administered 2021-01-29 – 2021-02-09 (×8): 237 mL via ORAL

## 2021-01-28 MED ORDER — PROSOURCE TF PO LIQD
45.0000 mL | Freq: Three times a day (TID) | ORAL | Status: DC
  Administered 2021-01-28 – 2021-01-29 (×3): 45 mL
  Filled 2021-01-28 (×3): qty 45

## 2021-01-28 MED ORDER — JEVITY 1.5 CAL/FIBER PO LIQD
980.0000 mL | ORAL | Status: DC
  Administered 2021-01-28: 980 mL
  Filled 2021-01-28: qty 1000

## 2021-01-28 NOTE — Progress Notes (Signed)
Occupational Therapy TBI Note  Patient Details  Name: Scott Day MRN: [id number] Date of Birth: Aug 13, 1960  Session 1: Today's Date: 01/28/2021 OT Individual Time: 4332-9518 OT Individual Time Calculation (min): 10 min    Session 2: Today's Date: 01/28/2021 OT Individual Time: 0950-1100 OT Individual Time Calculation (min): 70 min    Short Term Goals:  Week 2:  OT Short Term Goal 1 (Week 2): Pt will improve motor planning demo-ed by donning shirt with mod A OT Short Term Goal 2 (Week 2): Pt will require no more than mod cueing for sustained attention to ADL task for 1 min OT Short Term Goal 3 (Week 2): Pt will complete toileting tasks with min A  Skilled Therapeutic Interventions/Progress Updates:    Session 1: Pt received supine with no c/o pain. Mod cueing and physical assist to get pt to initiate bed mobility to EOB. Once EOB pt required min A to don shoes. Max A for oral care using suction toothbrush- extra assist provided d/t time constraints. Pt completed ambulatory transfer with min A, +2 present for safety to the TIS w/c. SLP entered room and OT assisted in transport and transfer to chair for MBS. No billing for MBS.    Session 2: Pt received from SLP following MBS in the TIS w/c. No c/o pain. Pt completed visual scanning and functional reaching activity using the BITS. He required mod cueing for initiation and sustained attention to task through session. Overall good use of LUE spontaneously throughout. Pt taken back to his room to complete ADLs at the sink. Mod multimodal cueing for attention to task, sequencing, and motor planning during ADLs. Mod A overall for bathing and dressing. Pt becoming more restless during session, reporting fatigue. He stood impulsively several times and made several efforts to return to bed. Toileting transfer with CGA- min A for navigating threshold to bathroom- no void. Pt still sexually inappropriate with staff but easily redirected. Pt  returned to his bed, all needs met, bed alarm set.   Therapy Documentation Precautions:  Precautions Precautions: Fall Precaution Comments: [name], Lt hemiparesis Restrictions Weight Bearing Restrictions: No  Agitated Behavior Scale: TBI Observation Details Observation Environment: CIR Start of observation period - Date: 01/28/21 Start of observation period - Time: 0950 End of observation period - Date: 01/28/21 End of observation period - Time: 1100 Agitated Behavior Scale (DO NOT LEAVE BLANKS) Short attention span, easy distractibility, inability to concentrate: Present to a moderate degree Impulsive, impatient, low tolerance for pain or frustration: Present to a moderate degree Uncooperative, resistant to care, demanding: Absent Violent and/or threatening violence toward people or property: Absent Explosive and/or unpredictable anger: Absent Rocking, rubbing, moaning, or other self-stimulating behavior: Absent Pulling at tubes, restraints, etc.: Absent Wandering from treatment areas: Present to a slight degree Restlessness, pacing, excessive movement: Present to a slight degree Repetitive behaviors, motor, and/or verbal: Present to a slight degree Rapid, loud, or excessive talking: Absent Sudden changes of mood: Absent Easily initiated or excessive crying and/or laughter: Absent Self-abusiveness, physical and/or verbal: Absent Agitated behavior scale total score: 21   Therapy/Group: Individual Therapy  Curtis Sites 01/28/2021, 10:46 AM
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
## 2021-01-28 NOTE — Progress Notes (Signed)
Speech Language Pathology TBI Note  Patient Details  Name: Scott Day MRN: [id number] Date of Birth: 08-17-1960  Today's Date: 01/28/2021 SLP Individual Time: 1400-1425 SLP Individual Time Calculation (min): 25 min  Short Term Goals: Week 2: SLP Short Term Goal 1 (Week 2): Patient will demonstrate orientation X 4 with Mod visual and verbal cues. SLP Short Term Goal 2 (Week 2): Patient will demonstrate sustained attention to tasks for 2 minutes with Mod verbal cues for redirection. SLP Short Term Goal 3 (Week 2): Patient will demonstrate 75% speech intelligibility at the word level with Min A verbal and visual cues for use of speech intelligibility strategies. SLP Short Term Goal 4 (Week 2): Patient will consume therapeutic PO trials of puree/HTL textures with SLP only with minimal overt s/sx of aspiration and min A verbal/visual cues for use of swallowing compensatory strategies.  Skilled Therapeutic Interventions: Skilled treatment session focused on dysphagia and cognitive goals. Upon arrival, patient was extremely restless and immediately using sexually inappropriate behavior. SLP attempted to redirect patient with minimal success. Patient reaching into his pants once restraints removed and continued with inappropriate language, therefore, SLP became more direct with patient (you are making me feel uncomfortable, please don't talk to me like that). Patient became verbally agitated for a brief minute. Patient unable to choose a liquid of choice from a field of 2 due to poor attention and restlessness. Patient consumed 2 tsp of honey-thick liquids via tsp without overt s/s of aspiration with Max verbal cues for swallow initiation. When taken a cup sip, patient with overt coughing indicative of aspiration, suspect due to poor attention to task/bolus. Patient reporting feeling warm and nauseous. Patient repositioned with fans placed on him and sock removed per his request.  Patient left upright in  bed with alarm on, restraints in place and all needs within reach. Continue with current plan of care.      Pain No/Denies Pain   Agitated Behavior Scale: TBI Observation Details Observation Environment: Patient's room Start of observation period - Date: 01/28/21 Start of observation period - Time: 1400 End of observation period - Date: 01/28/21 End of observation period - Time: 1425 Agitated Behavior Scale (DO NOT LEAVE BLANKS) Short attention span, easy distractibility, inability to concentrate: Present to a moderate degree Impulsive, impatient, low tolerance for pain or frustration: Present to a slight degree Uncooperative, resistant to care, demanding: Present to a slight degree Violent and/or threatening violence toward people or property: Absent Explosive and/or unpredictable anger: Present to a slight degree Rocking, rubbing, moaning, or other self-stimulating behavior: Present to a slight degree Pulling at tubes, restraints, etc.: Absent Wandering from treatment areas: Absent Restlessness, pacing, excessive movement: Present to a moderate degree Repetitive behaviors, motor, and/or verbal: Present to a moderate degree Rapid, loud, or excessive talking: Present to a moderate degree Sudden changes of mood: Present to a slight degree Easily initiated or excessive crying and/or laughter: Present to a slight degree Self-abusiveness, physical and/or verbal: Absent Agitated behavior scale total score: 28  Therapy/Group: Individual Therapy  Yazhini Mcaulay 01/28/2021, 3:07 PM

## 2021-01-28 NOTE — Progress Notes (Signed)
Modified Barium Swallow Progress Note  Patient Details  Name: Scott Day MRN: [id number] Date of Birth: 12-26-1960  Today's Date: 01/28/2021  Modified Barium Swallow completed.  Full report located under Chart Review in the Imaging Section.  Brief recommendations include the following:  Clinical Impression  Patient demonstrates a mild oropharyngeal dysphagia which is exacerbated by cognitive deficits. Throughout study, patient extremely restless while upright in the chair and required frequent redirection to task with cues needed to attend to bolus and initiation of swallow. Due to fluctuating level of attention, patient would consistently trigger his swallow at the level of the pyriform sinuses resulting in penetration of nectar-thick liquids into the laryngeal vestibule.  No penetration or aspiration observed with Dys. 1 textures or honey-thick liquids via cup. Recommend patient initiate a conservative diet of Dys. 1 textures with honey-thick liquids via cup with full supervision with patient consuming all meals in the wheelchair with minimal distractions.  Suspect patient will be able to upgrade quickly as patient's overall cognition improves.   Swallow Evaluation Recommendations       SLP Diet Recommendations: Dysphagia 1 (Puree) solids;Honey thick liquids   Liquid Administration via: Cup   Medication Administration: Crushed with puree   Supervision: Patient able to self feed;Full supervision/cueing for compensatory strategies;Staff to assist with self feeding   Compensations: Minimize environmental distractions;Slow rate;Small sips/bites (out of bed for meals)   Postural Changes: Seated upright at 90 degrees   Oral Care Recommendations: Oral care BID        Acelyn Basham 01/28/2021,10:32 AM

## 2021-01-28 NOTE — Progress Notes (Signed)
Hypoglycemic Event  CBG: 67   Treatment: 8 oz juice/soda  Symptoms: Sweaty, Hungry, and Nervous/irritable  Follow-up CBG: Time:1612  CBG Result:140  Possible Reasons for Event: Medication regimen: insulin  Comments/MD notified:n/a    Sheela Stack

## 2021-01-28 NOTE — Progress Notes (Signed)
Physical Therapy TBI Note  Patient Details  Name: Scott Day MRN: [id number] Date of Birth: [birth year]  Today's Date: 01/28/2021 PT Individual Time: 0803-0900 and [id number] PT Individual Time Calculation (min): 57 min and 45 min  Short Term Goals: Week 2:  PT Short Term Goal 1 (Week 2): Patient will perfom bed mobility with supervision consistently. PT Short Term Goal 2 (Week 2): Patient will perform basic transfers with min A of 1 person consistently. PT Short Term Goal 3 (Week 2): Patient will ambulate >100 feet with min A, 1 person assist. PT Short Term Goal 4 (Week 2): Patient will attend to the L with mod cues >50% of the time.  Skilled Therapeutic Interventions/Progress Updates:     Session 1: Patient in bed upon PT arrival. Patient alert and agreeable to PT session. Patient denied pain during session. NG tube paused and disconnected for PT session by RN.   Patient with improved orientation and awareness this session. Oriented to self, place, and situation, however, stated it was February 2021, provided gentle orientation to date, day of the week, and time. Patient appropriately requested to go to the bathroom this morning and was continent of bowl and bladder during session. Patient with inappropriate comments x2, able to provide awareness of inappropriate comments with patient agreeing that they were not appropriate this session. Minimized physical and mental stimulation/exertion during session due to upcoming MBS with SLP directly after PT session.   Therapeutic Activity: Bed Mobility: Patient performed supine to/from sit with min A for trunk support to come to sitting, otherwise supervision in a flat bed without use of bed rails. Provided verbal cues for initiation and hand placement to push up to sitting. Patient threaded his legs through shorts and doffed/donned a t-shirt with mod cues and assist for clothing orientation and sequencing while sitting EOB with supervision for  sitting balance. Transfers: Patient performed sit to/from stand from the bed or TIS w/c with CGA-min A with or without HHA. Provided verbal cues for initiation. Patient pulled up his shorts in standing with min A with self-initiation of use of L hand, PT provided positive reinforcement of L hand use. Patient performed an ambulatory transfer to/from the bathroom with min A HHA and mod cues for turning and use of grab bars. Performed peri-care with set-up assist and therapist providing total A for thoroughness after. Performed lower body clothing management with mod A due to incontinence brief management. Performed standing balance with CGA.  Patient performed a simulated low truck height car transfer with min A using HHA and car frame. Provided cues for safe technique and correcting sitting posture in the car with max cuing due to patient wanting to lay down in the seat.   Patient in bed with B soft wrist restraints secured, NG tube attached and running, HOB >30 deg, and Telesitter in place at end of session with breaks locked, bed alarm set, and all needs within reach.   Session 2: Patient in bed upon PT arrival. Patient alert and agreeable to PT session. Patient denied pain during session. NG tube paused and disconnected for therapy session by RN.  Patient with verbose and inappropriate language throughout session. Needed max redirection and cues to attend to tasks and manage inappropriate behaviors throughout session.   Therapeutic Activity: Bed Mobility: Patient performed supine to sit, as above. Patient crawled on hands and knees to get back into bed, made inappropriate comments and needed max cues to return to lying in the bed at end  of session. Transfers: Patient performed sit to/from stand x4 and stand pivot x1 with min A-CGA with and without HHA. Provided max verbal cues for initiation and attention to task.  Gait Training:  Patient ambulated 180 feet x2 using R HHA to block external  distractions with min A and +2 in front of patient to provide visual target and maintain attention to task. Ambulated with decreased gait speed step height and length, improved BOS, and followed cues for visually scanning to the L 2/4 trials.   Neuromuscular Re-ed: Patient performed the following standing balance and L upper extremity motor control activities: -standing tossing a ball 2x2-3 min with min A progressing to CGA with cues for increased use of L hand -sitting tossing a ball alternating counting by 1's with therapist to 20 with increased errors after 15 -seated counting from selected number from 1-6 to 10 or backwards to 0, counting by 2s to 10, and attempted counting backwards by 2s without success with max cues  Patient in bed with B soft wrist restraints secured and NG tube running with HOB >30 sed at end of session with breaks locked, bed alarm set, and all needs within reach.   Therapy Documentation Precautions:  Precautions Precautions: Fall Precaution Comments: [name], Lt hemiparesis Restrictions Weight Bearing Restrictions: No  Agitated Behavior Scale: TBI Observation Details Observation Environment: Patient's room Start of observation period - Date: 01/28/21 Start of observation period - Time: 0803 End of observation period - Date: 01/28/21 End of observation period - Time: 0900 Agitated Behavior Scale (DO NOT LEAVE BLANKS) Short attention span, easy distractibility, inability to concentrate: Present to a slight degree Impulsive, impatient, low tolerance for pain or frustration: Absent Uncooperative, resistant to care, demanding: Absent Violent and/or threatening violence toward people or property: Absent Explosive and/or unpredictable anger: Absent Rocking, rubbing, moaning, or other self-stimulating behavior: Absent Pulling at tubes, restraints, etc.: Absent Wandering from treatment areas: Absent Restlessness, pacing, excessive movement: Present to a  slight degree Repetitive behaviors, motor, and/or verbal: Present to a slight degree Rapid, loud, or excessive talking: Absent Sudden changes of mood: Absent Easily initiated or excessive crying and/or laughter: Absent Self-abusiveness, physical and/or verbal: Absent Agitated behavior scale total score: 17 Agitated Behavior Scale: TBI Observation Details Observation Environment: Patient's room Start of observation period - Date: 01/28/21 Start of observation period - Time: 1302 End of observation period - Date: 01/28/21 End of observation period - Time: 1345 Agitated Behavior Scale (DO NOT LEAVE BLANKS) Short attention span, easy distractibility, inability to concentrate: Present to a moderate degree Impulsive, impatient, low tolerance for pain or frustration: Present to a slight degree Uncooperative, resistant to care, demanding: Present to a slight degree Violent and/or threatening violence toward people or property: Absent Explosive and/or unpredictable anger: Absent Rocking, rubbing, moaning, or other self-stimulating behavior: Present to a slight degree Pulling at tubes, restraints, etc.: Absent Wandering from treatment areas: Absent Restlessness, pacing, excessive movement: Present to a moderate degree Repetitive behaviors, motor, and/or verbal: Present to a moderate degree Rapid, loud, or excessive talking: Present to a moderate degree Sudden changes of mood: Absent Easily initiated or excessive crying and/or laughter: Absent Self-abusiveness, physical and/or verbal: Absent Agitated behavior scale total score: 25   Therapy/Group: Individual Therapy  Muath Hallam L Larrisha Babineau PT, DPT  01/28/2021, 3:51 PM

## 2021-01-29 DIAGNOSIS — R1312 Dysphagia, oropharyngeal phase: Secondary | ICD-10-CM | POA: Diagnosis not present

## 2021-01-29 DIAGNOSIS — I10 Essential (primary) hypertension: Secondary | ICD-10-CM | POA: Diagnosis not present

## 2021-01-29 DIAGNOSIS — Z930 Tracheostomy status: Secondary | ICD-10-CM | POA: Diagnosis not present

## 2021-01-29 DIAGNOSIS — S069X2S Unspecified intracranial injury with loss of consciousness of 31 minutes to 59 minutes, sequela: Secondary | ICD-10-CM | POA: Diagnosis not present

## 2021-01-29 LAB — GLUCOSE, CAPILLARY
Glucose-Capillary: 133 mg/dL — ABNORMAL HIGH (ref 70–99)
Glucose-Capillary: 135 mg/dL — ABNORMAL HIGH (ref 70–99)
Glucose-Capillary: 138 mg/dL — ABNORMAL HIGH (ref 70–99)
Glucose-Capillary: 148 mg/dL — ABNORMAL HIGH (ref 70–99)
Glucose-Capillary: 156 mg/dL — ABNORMAL HIGH (ref 70–99)
Glucose-Capillary: 185 mg/dL — ABNORMAL HIGH (ref 70–99)
Glucose-Capillary: 78 mg/dL (ref 70–99)

## 2021-01-29 MED ORDER — LISINOPRIL 2.5 MG PO TABS
2.5000 mg | ORAL_TABLET | Freq: Every day | ORAL | Status: DC
  Administered 2021-01-30 – 2021-02-09 (×11): 2.5 mg via ORAL
  Filled 2021-01-29 (×11): qty 1

## 2021-01-29 MED ORDER — ACETAMINOPHEN 325 MG PO TABS
325.0000 mg | ORAL_TABLET | ORAL | Status: DC | PRN
  Administered 2021-01-31 – 2021-02-04 (×2): 650 mg via ORAL
  Filled 2021-01-29 (×2): qty 2

## 2021-01-29 MED ORDER — FLUOXETINE HCL 20 MG PO CAPS
40.0000 mg | ORAL_CAPSULE | Freq: Every day | ORAL | Status: DC
  Administered 2021-01-30 – 2021-02-09 (×11): 40 mg via ORAL
  Filled 2021-01-29 (×11): qty 2

## 2021-01-29 MED ORDER — METHYLPHENIDATE HCL 5 MG PO TABS
10.0000 mg | ORAL_TABLET | Freq: Two times a day (BID) | ORAL | Status: DC
  Administered 2021-01-29 – 2021-02-09 (×23): 10 mg via ORAL
  Filled 2021-01-29 (×24): qty 2

## 2021-01-29 MED ORDER — METHOCARBAMOL 500 MG PO TABS
1000.0000 mg | ORAL_TABLET | Freq: Three times a day (TID) | ORAL | Status: DC
  Administered 2021-01-29 – 2021-02-09 (×34): 1000 mg via ORAL
  Filled 2021-01-29 (×34): qty 2

## 2021-01-29 MED ORDER — JEVITY 1.5 CAL/FIBER PO LIQD
420.0000 mL | ORAL | Status: DC
Start: 2021-01-29 — End: 2021-01-30
  Administered 2021-01-29: 420 mL
  Filled 2021-01-29 (×2): qty 474

## 2021-01-29 MED ORDER — CHLORHEXIDINE GLUCONATE CLOTH 2 % EX PADS
6.0000 | MEDICATED_PAD | Freq: Every day | CUTANEOUS | Status: AC
  Administered 2021-01-29 – 2021-02-02 (×5): 6 via TOPICAL

## 2021-01-29 MED ORDER — NUTRISOURCE FIBER PO PACK
1.0000 | PACK | Freq: Two times a day (BID) | ORAL | Status: DC
  Administered 2021-01-29 – 2021-02-06 (×12): 1 via ORAL
  Filled 2021-01-29 (×17): qty 1

## 2021-01-29 MED ORDER — THIAMINE HCL 100 MG PO TABS
100.0000 mg | ORAL_TABLET | Freq: Every day | ORAL | Status: DC
  Administered 2021-01-30: 100 mg via ORAL
  Filled 2021-01-29: qty 1

## 2021-01-29 MED ORDER — METOPROLOL TARTRATE 25 MG PO TABS
25.0000 mg | ORAL_TABLET | Freq: Two times a day (BID) | ORAL | Status: DC
  Administered 2021-01-29 – 2021-02-09 (×23): 25 mg via ORAL
  Filled 2021-01-29 (×23): qty 1

## 2021-01-29 MED ORDER — GUAIFENESIN 100 MG/5ML PO LIQD: 10.0000 mL | Freq: Four times a day (QID) | ORAL | Status: DC

## 2021-01-29 MED ORDER — ADULT MULTIVITAMIN W/MINERALS CH
1.0000 | ORAL_TABLET | Freq: Every day | ORAL | Status: DC
  Administered 2021-01-30 – 2021-02-09 (×11): 1 via ORAL
  Filled 2021-01-29 (×11): qty 1

## 2021-01-29 MED ORDER — LORAZEPAM 0.5 MG PO TABS
0.5000 mg | ORAL_TABLET | Freq: Four times a day (QID) | ORAL | Status: DC | PRN
  Administered 2021-02-02 – 2021-02-03 (×2): 0.5 mg via ORAL
  Filled 2021-01-29 (×2): qty 1

## 2021-01-29 MED ORDER — VALPROIC ACID 250 MG PO CAPS
500.0000 mg | ORAL_CAPSULE | Freq: Two times a day (BID) | ORAL | Status: DC
  Administered 2021-01-29 – 2021-01-30 (×2): 500 mg via ORAL
  Filled 2021-01-29 (×4): qty 2

## 2021-01-29 MED ORDER — APIXABAN 5 MG PO TABS
5.0000 mg | ORAL_TABLET | Freq: Two times a day (BID) | ORAL | Status: DC
  Administered 2021-01-29 – 2021-02-09 (×23): 5 mg via ORAL
  Filled 2021-01-29 (×23): qty 1

## 2021-01-29 MED ORDER — QUETIAPINE FUMARATE 50 MG PO TABS
50.0000 mg | ORAL_TABLET | Freq: Every day | ORAL | Status: DC
  Administered 2021-01-29 – 2021-02-09 (×12): 50 mg via ORAL
  Filled 2021-01-29 (×12): qty 1

## 2021-01-29 MED ORDER — ROSUVASTATIN CALCIUM 20 MG PO TABS
20.0000 mg | ORAL_TABLET | Freq: Every day | ORAL | Status: DC
  Administered 2021-01-29 – 2021-02-09 (×12): 20 mg via ORAL
  Filled 2021-01-29 (×12): qty 1

## 2021-01-29 MED ORDER — INSULIN ASPART 100 UNIT/ML IJ SOLN
6.0000 [IU] | INTRAMUSCULAR | Status: DC
  Administered 2021-01-29 – 2021-01-30 (×8): 6 [IU] via SUBCUTANEOUS

## 2021-01-29 MED ORDER — MUPIROCIN 2 % EX OINT
1.0000 "application " | TOPICAL_OINTMENT | Freq: Two times a day (BID) | CUTANEOUS | Status: AC
  Administered 2021-01-29 – 2021-02-01 (×7): 1 via NASAL
  Filled 2021-01-29: qty 22

## 2021-01-29 MED ORDER — FOLIC ACID 1 MG PO TABS
1.0000 mg | ORAL_TABLET | Freq: Every day | ORAL | Status: DC
  Administered 2021-01-30 – 2021-02-09 (×11): 1 mg via ORAL
  Filled 2021-01-29 (×11): qty 1

## 2021-01-29 MED ORDER — GUAIFENESIN 100 MG/5ML PO LIQD
10.0000 mL | Freq: Four times a day (QID) | ORAL | Status: DC
  Administered 2021-01-29 – 2021-02-09 (×37): 10 mL via ORAL
  Filled 2021-01-29 (×38): qty 10

## 2021-01-29 MED ORDER — OXYCODONE HCL 5 MG PO TABS: 2.5000 mg | ORAL_TABLET | ORAL | Status: DC | PRN

## 2021-01-29 MED ORDER — PROSOURCE PLUS PO LIQD
30.0000 mL | Freq: Three times a day (TID) | ORAL | Status: DC
  Administered 2021-01-29 – 2021-02-09 (×28): 30 mL via ORAL
  Filled 2021-01-29 (×31): qty 30

## 2021-01-29 NOTE — Progress Notes (Signed)
Physical Therapy Session Note  Patient Details  Name: Scott Day MRN: [id number] Date of Birth: 1961/01/22  Today's Date: 01/29/2021 PT Individual Time: 1400-1445 PT Individual Time Calculation (min): 45 min   Short Term Goals: Week 1:  PT Short Term Goal 1 (Week 1): Patient will perform bed mobility with mod A of 1 person. PT Short Term Goal 1 - Progress (Week 1): Met PT Short Term Goal 2 (Week 1): Patient will perform sit to stand with mod A using LRAD. PT Short Term Goal 2 - Progress (Week 1): Met PT Short Term Goal 3 (Week 1): Patient will initiate functional transfers with mod A. PT Short Term Goal 3 - Progress (Week 1): Met PT Short Term Goal 4 (Week 1): Patient with ambulate 10 ft using LRAD with mod A. PT Short Term Goal 4 - Progress (Week 1): Met PT Short Term Goal 5 (Week 1): Patient will attend to the L with mod cues >50% of the time. PT Short Term Goal 5 - Progress (Week 1): Progressing toward goal Week 2:  PT Short Term Goal 1 (Week 2): Patient will perfom bed mobility with supervision consistently. PT Short Term Goal 2 (Week 2): Patient will perform basic transfers with min A of 1 person consistently. PT Short Term Goal 3 (Week 2): Patient will ambulate >100 feet with min A, 1 person assist. PT Short Term Goal 4 (Week 2): Patient will attend to the L with mod cues >50% of the time. Week 3:     Skilled Therapeutic Interventions/Progress Updates:    Pt initially supine, wrist restraints, sleeping but easily aroused.  Supine to sit w/hob elevated and supervision after mult cues. stand pivot transfer to wc w/cga. Transported to gym.  Unable to orient patient to place or situation. Pt engaged in card matching activity placed to pt. L w/3/6 accuracy before losing attention to task.  Following brief break, pt performs w/25% accuracy.   Gait 83f x 1, 1052fx 1 w/min assist, cues to attend to task, pt stops at parallel bars/distracted by power controls, spontaneously begins  sstepping up and down off of ledge holding to outer bar.  Performed sidestepping w/cues and cga. Mult attempts to redirect to return to wc.  Turns to sit in unsafe manner, max cues and mod assist for safety.  Pt distracted primarily by inner distractions in low stim environment, inappropriate at times but emerging awareness as he frequently prefaces statements with "would it be inaapropriate if I...."  Also able to state situation and gross place during session.  At end of session, stand pivot transfer to bed from wc w/min assist, cues for safety.  Sit to supine w/supervision and cues.  Unable to scoot up in bed d/t inability to focus on task, distracted by wrist restraints/applied by therapist.  Pt left supine w/rails up x 34 alarm set, bed in lowest position, and needs in reach.     Therapy Documentation Precautions:  Precautions Precautions: Fall Precaution Comments: TrMicheal LikensLt hemiparesis Restrictions Weight Bearing Restrictions: No  Therapy/Group: Individual Therapy  BaJerrilyn Cairo1/12/2020, 3:50 PM

## 2021-01-29 NOTE — Progress Notes (Signed)
Speech Language Pathology TBI Note  Patient Details  Name: Scott Day MRN: [id number] Date of Birth: May 21, 1960  Today's Date: 01/29/2021 SLP Individual Time: 0820-0900 SLP Individual Time Calculation (min): 40 min  Short Term Goals: Week 2: SLP Short Term Goal 1 (Week 2): Patient will demonstrate orientation X 4 with Mod visual and verbal cues. SLP Short Term Goal 2 (Week 2): Patient will demonstrate sustained attention to tasks for 2 minutes with Mod verbal cues for redirection. SLP Short Term Goal 3 (Week 2): Patient will demonstrate 75% speech intelligibility at the word level with Min A verbal and visual cues for use of speech intelligibility strategies. SLP Short Term Goal 4 (Week 2): Patient will consume Dys. 1 textures with honey-thick liquids with minimal overt s/sx of aspiration and mod A verbal/visual cues for use of swallowing compensatory strategies. SLP Short Term Goal 4 - Progress (Week 2): Updated due to goal met  Skilled Therapeutic Interventions: Skilled treatment session focused on dysphagia and cognitive goals. SLP facilitated session by providing a snack of Dys. 1 textures with honey-thick liquids via cup. Patient consumed snack without overt s/s of aspiration but required Mod verbal cues for attention to bolus and swallow initiation. Recommend patient continue current diet. Throughout snack, patient restless and verbose with patient using inappropriate language at times, however, patient easier to redirect today compared to yesterday. Patient also demonstrated less physically inappropriate behavior today. Patient independently oriented to place and situation with Max verbal cues for orientation to month. Patient was ~90% intelligible today at the sentence level. Patient left upright in bed with alarm on and restraints in place. Continue with current plan of care.      Pain No/Denies Pain   Agitated Behavior Scale: TBI Observation Details Observation Environment:  Patient's room Start of observation period - Date: 01/29/21 Start of observation period - Time: 0820 End of observation period - Date: 01/29/21 End of observation period - Time: 0900 Agitated Behavior Scale (DO NOT LEAVE BLANKS) Short attention span, easy distractibility, inability to concentrate: Present to a moderate degree Impulsive, impatient, low tolerance for pain or frustration: Present to a moderate degree Uncooperative, resistant to care, demanding: Absent Violent and/or threatening violence toward people or property: Absent Explosive and/or unpredictable anger: Absent Rocking, rubbing, moaning, or other self-stimulating behavior: Absent Pulling at tubes, restraints, etc.: Absent Wandering from treatment areas: Absent Restlessness, pacing, excessive movement: Present to a slight degree Repetitive behaviors, motor, and/or verbal: Present to a slight degree Rapid, loud, or excessive talking: Present to a slight degree Sudden changes of mood: Absent Easily initiated or excessive crying and/or laughter: Absent Self-abusiveness, physical and/or verbal: Absent Agitated behavior scale total score: 21  Therapy/Group: Individual Therapy  Florina Glas, [place] 01/29/2021, 4:05 PM

## 2021-01-29 NOTE — Progress Notes (Signed)
Infection prevention consulted. Recommended to proceed with positive MRSA PCR protocol. Bactroban and CHG. Contact precautions discontinued. Sheela Stack, LPN

## 2021-01-29 NOTE — Progress Notes (Signed)
Occupational Therapy Session Note  Patient Details  Name: Scott Day MRN: [id number] Date of Birth: 1961-03-06  Today's Date: 01/29/2021 OT Individual Time: 1400-1430 OT Individual Time Calculation (min): 30 min    Short Term Goals:  Week 2:  OT Short Term Goal 1 (Week 2): Pt will improve motor planning demo-ed by donning shirt with mod A OT Short Term Goal 2 (Week 2): Pt will require no more than mod cueing for sustained attention to ADL task for 1 min OT Short Term Goal 3 (Week 2): Pt will complete toileting tasks with min A  Skilled Therapeutic Interventions/Progress Updates:    Pt received supine, no indications or c/o pain throughout session. Pt required mod multimodal cues and mod A for supine to sit to EOB. Nurse arrived and needing to change patch behind pts ear.  Tape painful during removal and pt standing up abruptly and strongly demanding nurse to stop.  Pt redirected relatively easily with calming reassurance and stand pivot to recliner with CGA for easier access by nurse.  Pt then requesting back to bed.  Mod cues and min assist stand pivot to EOB.  Sit to supine with CGA.  Pt frequently needing redirection from inappropriate comments/touching throughout session. Semi reclined in bed, bilateral wrist restraints donned, call bell in reach.    Therapy Documentation Precautions:  Precautions Precautions: Fall Precaution Comments: [name], Lt hemiparesis Restrictions Weight Bearing Restrictions: No    Therapy/Group: Individual Therapy  Ezekiel Slocumb 01/29/2021, 4:47 PM
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
## 2021-01-29 NOTE — Progress Notes (Signed)
Occupational Therapy TBI Note  Patient Details  Name: Scott Day MRN: [id number] Date of Birth: 1960/06/25  Today's Date: 01/29/2021 OT Individual Time: 8916-9450 OT Individual Time Calculation (min): 75 min    Short Term Goals: Week 2:  OT Short Term Goal 1 (Week 2): Pt will improve motor planning demo-ed by donning shirt with mod A OT Short Term Goal 2 (Week 2): Pt will require no more than mod cueing for sustained attention to ADL task for 1 min OT Short Term Goal 3 (Week 2): Pt will complete toileting tasks with min A  Skilled Therapeutic Interventions/Progress Updates:    Pt received supine, no indications or c/o pain throughout session. Pt required mod cueing and mod A for initiation of bed mobility to EOB. Once EOB he attempted to return to supine several times before he was able to initiate motor plan to ambulate into bathroom. He was agreeable to take shower and PA stated stoma ok to get wet, to prevent direct water spraying. Pt completed toileting, requiring mod cueing and min A for ambulatory transfer and safe transfer to toilet d/t unsafe premature sitting on surfaces. He completed shower at mod A level overall, however max cueing, often impulsively standing. Redirection from inappropriate comments/touching throughout session. He completed transfer back to TIS w/c. Very painful for OT to remove tape from pt's neck, but he tolerated it well. Pt requested ice cream and was provided with a chocolate magic cup. He consumed the entire cup with OT assisting with loading spoon. No overt s/s until very last bite when he had strong cough and throat clearing- likely from fatigue/inattention. He donned a shirt with mod A and pants with mod A- improved intellectual awareness with dressing, stating deficits. He was left sitting up in the TIS w/c, chair alarm belt fastened. All needs met.   Therapy Documentation Precautions:  Precautions Precautions: Fall Precaution Comments: [name],  Lt hemiparesis Restrictions Weight Bearing Restrictions: No  Agitated Behavior Scale: TBI Observation Details Observation Environment: pt room Start of observation period - Date: 01/29/21 Start of observation period - Time: 0930 End of observation period - Date: 01/29/21 End of observation period - Time: 1045 Agitated Behavior Scale (DO NOT LEAVE BLANKS) Short attention span, easy distractibility, inability to concentrate: Present to a moderate degree Impulsive, impatient, low tolerance for pain or frustration: Present to a moderate degree Uncooperative, resistant to care, demanding: Absent Violent and/or threatening violence toward people or property: Absent Explosive and/or unpredictable anger: Absent Rocking, rubbing, moaning, or other self-stimulating behavior: Absent Pulling at tubes, restraints, etc.: Absent Wandering from treatment areas: Absent Restlessness, pacing, excessive movement: Absent Repetitive behaviors, motor, and/or verbal: Present to a slight degree Rapid, loud, or excessive talking: Absent Sudden changes of mood: Absent Easily initiated or excessive crying and/or laughter: Absent Self-abusiveness, physical and/or verbal: Absent Agitated behavior scale total score: 19    Therapy/Group: Individual Therapy  Curtis Sites 01/29/2021, 11:21 AM

## 2021-01-30 DIAGNOSIS — S069X2S Unspecified intracranial injury with loss of consciousness of 31 minutes to 59 minutes, sequela: Secondary | ICD-10-CM | POA: Diagnosis not present

## 2021-01-30 DIAGNOSIS — R1312 Dysphagia, oropharyngeal phase: Secondary | ICD-10-CM | POA: Diagnosis not present

## 2021-01-30 DIAGNOSIS — I10 Essential (primary) hypertension: Secondary | ICD-10-CM | POA: Diagnosis not present

## 2021-01-30 DIAGNOSIS — Z930 Tracheostomy status: Secondary | ICD-10-CM | POA: Diagnosis not present

## 2021-01-30 LAB — GLUCOSE, CAPILLARY
Glucose-Capillary: 108 mg/dL — ABNORMAL HIGH (ref 70–99)
Glucose-Capillary: 131 mg/dL — ABNORMAL HIGH (ref 70–99)
Glucose-Capillary: 146 mg/dL — ABNORMAL HIGH (ref 70–99)
Glucose-Capillary: 147 mg/dL — ABNORMAL HIGH (ref 70–99)
Glucose-Capillary: 162 mg/dL — ABNORMAL HIGH (ref 70–99)
Glucose-Capillary: 188 mg/dL — ABNORMAL HIGH (ref 70–99)
Glucose-Capillary: 200 mg/dL — ABNORMAL HIGH (ref 70–99)

## 2021-01-30 MED ORDER — DIVALPROEX SODIUM 125 MG PO CSDR
500.0000 mg | DELAYED_RELEASE_CAPSULE | Freq: Two times a day (BID) | ORAL | Status: DC
  Administered 2021-01-30 – 2021-02-09 (×21): 500 mg via ORAL
  Filled 2021-01-30 (×21): qty 4

## 2021-01-30 MED ORDER — SODIUM CHLORIDE 0.45 % IV SOLN: INTRAVENOUS | Status: DC

## 2021-01-30 MED ORDER — INSULIN ASPART 100 UNIT/ML IJ SOLN
0.0000 [IU] | Freq: Three times a day (TID) | INTRAMUSCULAR | Status: DC
  Administered 2021-01-30: 2 [IU] via SUBCUTANEOUS
  Administered 2021-01-31 (×2): 3 [IU] via SUBCUTANEOUS
  Administered 2021-01-31: 2 [IU] via SUBCUTANEOUS
  Administered 2021-02-01: 3 [IU] via SUBCUTANEOUS
  Administered 2021-02-01 (×2): 2 [IU] via SUBCUTANEOUS
  Administered 2021-02-02 (×2): 3 [IU] via SUBCUTANEOUS
  Administered 2021-02-03: 2 [IU] via SUBCUTANEOUS
  Administered 2021-02-03: 3 [IU] via SUBCUTANEOUS
  Administered 2021-02-04 (×3): 2 [IU] via SUBCUTANEOUS
  Administered 2021-02-05 (×2): 3 [IU] via SUBCUTANEOUS
  Administered 2021-02-06 – 2021-02-08 (×7): 2 [IU] via SUBCUTANEOUS
  Administered 2021-02-08: 3 [IU] via SUBCUTANEOUS
  Administered 2021-02-09 (×2): 2 [IU] via SUBCUTANEOUS

## 2021-01-30 NOTE — Progress Notes (Signed)
Speech Language Pathology TBI Note  Patient Details  Name: Scott Day MRN: [id number] Date of Birth: [birth year]  Today's Date: 01/30/2021 SLP Individual Time: 0830-0900 SLP Individual Time Calculation (min): 30 min  Short Term Goals: Week 2: SLP Short Term Goal 1 (Week 2): Patient will demonstrate orientation X 4 with Mod visual and verbal cues. SLP Short Term Goal 2 (Week 2): Patient will demonstrate sustained attention to tasks for 2 minutes with Mod verbal cues for redirection. SLP Short Term Goal 3 (Week 2): Patient will demonstrate 75% speech intelligibility at the word level with Min A verbal and visual cues for use of speech intelligibility strategies. SLP Short Term Goal 4 (Week 2): Patient will consume Dys. 1 textures with honey-thick liquids with minimal overt s/sx of aspiration and mod A verbal/visual cues for use of swallowing compensatory strategies. SLP Short Term Goal 4 - Progress (Week 2): Updated due to goal met  Skilled Therapeutic Interventions:  Pt was seen for skilled ST targeting goals for cognition and dysphagia.  Upon arrival, pt was sleeping but awakened easily to voice.  He initially made mildly suggestive remarks about the therapist's appearance but he was easily redirected and throughout session made several comments indicating emerging awareness about topic appropriateness, such as "If you only knew what I was thinking" or "I maybe shouldn't say this."  Pt consumed presentations of his currently prescribed diet with no overt s/s of aspiration and mod cues for use of safe swallowing strategies.  Pt needed max faded to mod assist multimodal cues to sustain his attention to meal in order to feed himself.  Pt was left in bed with bed alarm set and nursing at bedside.  Continue per current plan of care.    Pain Pain Assessment Pain Scale: 0-10 Pain Score: 0-No pain  Agitated Behavior Scale: TBI Observation Details Observation Environment: pt's room Start of  observation period - Date: 01/30/21 Start of observation period - Time: 0830 End of observation period - Date: 01/30/21 End of observation period - Time: 0900 Agitated Behavior Scale (DO NOT LEAVE BLANKS) Short attention span, easy distractibility, inability to concentrate: Present to a moderate degree Impulsive, impatient, low tolerance for pain or frustration: Present to a slight degree Uncooperative, resistant to care, demanding: Absent Violent and/or threatening violence toward people or property: Absent Explosive and/or unpredictable anger: Absent Rocking, rubbing, moaning, or other self-stimulating behavior: Absent Pulling at tubes, restraints, etc.: Absent Wandering from treatment areas: Absent Restlessness, pacing, excessive movement: Absent Repetitive behaviors, motor, and/or verbal: Absent Rapid, loud, or excessive talking: Present to a slight degree Sudden changes of mood: Absent Easily initiated or excessive crying and/or laughter: Absent Self-abusiveness, physical and/or verbal: Absent Agitated behavior scale total score: 18  Therapy/Group: Individual Therapy  Sharnetta Gielow, Selinda Orion 01/30/2021, 12:19 PM

## 2021-01-30 NOTE — Progress Notes (Signed)
Occupational Therapy Session Note  Patient Details  Name: Scott Day MRN: [id number] Date of Birth: Aug 31, 1960  Today's Date: 01/30/2021 OT Individual Time: 2256-7209 OT Individual Time Calculation (min): 56 min    Short Term Goals: Week 2:  OT Short Term Goal 1 (Week 2): Pt will improve motor planning demo-ed by donning shirt with mod A OT Short Term Goal 2 (Week 2): Pt will require no more than mod cueing for sustained attention to ADL task for 1 min OT Short Term Goal 3 (Week 2): Pt will complete toileting tasks with min A  Skilled Therapeutic Interventions/Progress Updates:  Patient met lying supine in bed in agreement with OT treatment session. 0/10 pain reported at rest and with activity. Session with focus on self-care re-education and therapeutic activity. Mod multimodal cues for safety/sequencing of all tasks and cues for appropriateness throughout. Supine to EOB with Min A and use of bed rail. Able to doff hospital socks and don socks/shoes with Mod A in sitting. Assist to attain/maintain figure-4 position. Patient attempting to spontaneously return to supine several times tactile cues to remain seated at EOB. Patient completed 3/3 parts of toileting task with Mod A. Hand washing at sink level with Min A and multimodal cues and repeat reminders for sequencing. Patient c/o feeling dizzy several times throughout session. BP 107/79 in sitting. No drop in BP while standing. HR and SpO2 WNL. Attempted therapeutic activity in standing with focus on attention to task. Patient easily distracted throughout requiring frequent redirection. Return to supine with CGA for safety and controlled descent of trunk. Patient sitting prematurely requiring external assist to position hips on EOB. Session concluded with patient lying supine in bed with call bell within reach, bed alarm activated and all needs met.   Therapy Documentation Precautions:  Precautions Precautions: Fall Precaution Comments:  Trach, cortrak, Lt hemiparesis Restrictions Weight Bearing Restrictions: No General:    Therapy/Group: Individual Therapy  [name] 01/30/2021, 1:35 PM

## 2021-01-30 NOTE — Progress Notes (Signed)
Physical Therapy TBI Note  Patient Details  Name: Scott Day MRN: [id number] Date of Birth: 06-Mar-1961  Today's Date: 01/30/2021 PT Individual Time: 6712-4580 and 1415-1445 PT Individual Time Calculation (min): 48 min and 30 min  Short Term Goals: Week 1:  PT Short Term Goal 1 (Week 1): Patient will perform bed mobility with mod A of 1 person. PT Short Term Goal 1 - Progress (Week 1): Met PT Short Term Goal 2 (Week 1): Patient will perform sit to stand with mod A using LRAD. PT Short Term Goal 2 - Progress (Week 1): Met PT Short Term Goal 3 (Week 1): Patient will initiate functional transfers with mod A. PT Short Term Goal 3 - Progress (Week 1): Met PT Short Term Goal 4 (Week 1): Patient with ambulate 10 ft using LRAD with mod A. PT Short Term Goal 4 - Progress (Week 1): Met PT Short Term Goal 5 (Week 1): Patient will attend to the L with mod cues >50% of the time. PT Short Term Goal 5 - Progress (Week 1): Progressing toward goal Week 2:  PT Short Term Goal 1 (Week 2): Patient will perfom bed mobility with supervision consistently. PT Short Term Goal 2 (Week 2): Patient will perform basic transfers with min A of 1 person consistently. PT Short Term Goal 3 (Week 2): Patient will ambulate >100 feet with min A, 1 person assist. PT Short Term Goal 4 (Week 2): Patient will attend to the L with mod cues >50% of the time. Week 3:     Skilled Therapeutic Interventions/Progress Updates:    Pt initially supine receiving meds.  Requesting assist to use BR.  Supine to sit w/supervision using rail and hob elevated.  Pt incontinent of bowel and bladder in brief.  Sit to stand w/cga, gait 23f to commode and commode transfer w/cga and use of rail, therapist assisted w/clothing management d/t lack of awareness. Pt able to void/complete BM w/supervision.  Repeated Sit to stand and squats w/rail and cga, able to clean himself w/mult cues to attend to task and to drop wipes in toilet vs holding in hand.   Pt c/o feeling like "I am going to pass out".  Vitals assessed and all stable, see flowsheet.   Pt able to don shirt w/mult cues, mod assist.  Lower body dressing in sitting/standing w/mod assist.  Gait x 545fto sink, washes hands w/cues for sequencing, to locate controls/soap, to turn off sink when complete.  Gait 5 ft to bed, pt requested to rest in supine.  Sit to supine w/supervision, impulsive.  Scoots in bed w/max multimodal cues and additional time.    In supine, pt able to state place, situation, date as October.  Pt w/some improvements in insight stating "am I a good patient", "I am coming along", and easily redirected when making inappropriate comments which are less extreme in nature than previously expressed.    Pt unable to continue w/session d/t fatigue.   PM session Pt initially awake, alert, oriented to situation and place, in wrist restraints.  Supine to sit requires additional time and cueing d/t poor initiation of task.  In sitting pt unable to attend to donning his shoes.  Pt w/increased inappropriate comments and more difficulty redirecting to appropriate behavior vs am session.  Also significantly decreased attention and initiation.  Sat x 15 min w/supervision during attempted behavior mod, orientation, failed efforts at dressing/transfers.   Pt c/o "I hate feeling like this".  Therapist attempted to check BP  but pt unable to relax UE resulting in inaccurate measures.  Nursing in and requested pt return supine for reading.  Therapist provided distraction to L for nurse to be able to obtain reading - 108/84.  UE wrist restraints reapplied.  Pt left supine w/rails up x 4 alarm set, bed in lowest position, and needs in reach.     Therapy Documentation Precautions:  Precautions Precautions: Fall Precaution Comments: [name], Lt hemiparesis Restrictions Weight Bearing Restrictions: No   Therapy/Group: Individual Therapy' Callie Fielding, [place] 01/30/2021, 4:14 PM
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
## 2021-01-31 DIAGNOSIS — S069X2D Unspecified intracranial injury with loss of consciousness of 31 minutes to 59 minutes, subsequent encounter: Secondary | ICD-10-CM | POA: Diagnosis not present

## 2021-01-31 DIAGNOSIS — S069X2S Unspecified intracranial injury with loss of consciousness of 31 minutes to 59 minutes, sequela: Secondary | ICD-10-CM | POA: Diagnosis not present

## 2021-01-31 LAB — GLUCOSE, CAPILLARY
Glucose-Capillary: 108 mg/dL — ABNORMAL HIGH (ref 70–99)
Glucose-Capillary: 127 mg/dL — ABNORMAL HIGH (ref 70–99)
Glucose-Capillary: 131 mg/dL — ABNORMAL HIGH (ref 70–99)
Glucose-Capillary: 160 mg/dL — ABNORMAL HIGH (ref 70–99)
Glucose-Capillary: 163 mg/dL — ABNORMAL HIGH (ref 70–99)
Glucose-Capillary: 175 mg/dL — ABNORMAL HIGH (ref 70–99)
Glucose-Capillary: 97 mg/dL (ref 70–99)

## 2021-01-31 NOTE — Progress Notes (Signed)
Physical Therapy TBI Note  Patient Details  Name: Scott Day MRN: [id number] Date of Birth: [birth year]  Today's Date: 01/31/2021 PT Individual Time: 1000-1055 PT Individual Time Calculation (min): 55 min   Short Term Goals: Week 1:  PT Short Term Goal 1 (Week 1): Patient will perform bed mobility with mod A of 1 person. PT Short Term Goal 1 - Progress (Week 1): Met PT Short Term Goal 2 (Week 1): Patient will perform sit to stand with mod A using LRAD. PT Short Term Goal 2 - Progress (Week 1): Met PT Short Term Goal 3 (Week 1): Patient will initiate functional transfers with mod A. PT Short Term Goal 3 - Progress (Week 1): Met PT Short Term Goal 4 (Week 1): Patient with ambulate 10 ft using LRAD with mod A. PT Short Term Goal 4 - Progress (Week 1): Met PT Short Term Goal 5 (Week 1): Patient will attend to the L with mod cues >50% of the time. PT Short Term Goal 5 - Progress (Week 1): Progressing toward goal Week 2:  PT Short Term Goal 1 (Week 2): Patient will perfom bed mobility with supervision consistently. PT Short Term Goal 2 (Week 2): Patient will perform basic transfers with min A of 1 person consistently. PT Short Term Goal 3 (Week 2): Patient will ambulate >100 feet with min A, 1 person assist. PT Short Term Goal 4 (Week 2): Patient will attend to the L with mod cues >50% of the time. Week 3:     Skilled Therapeutic Interventions/Progress Updates:   Pt initially oob in TIS wc w/wrist restraints.  Awake, alert, confused.   Pt transported to gym.  stand pivot transfer to mat w/min assist, multimodal cues for initiationof task.  Pt dons shorts w/mod assist, shirt w/mod assist, additional time, redirection to task mult times. Sit to stand w/min assist, gait 65f w/min assist, mild instability.  Pt rests in sitting.  Mult inappropriate comments such as "kiss me", "I am in love with you and you will be my wife" "why don't you sneak in my room tonight".  Pt reoriented w/minimal  success but able to redirect. Sit to stand w/mult cues for initiation of task Ascends/descends 4 6in steps and 8 3in steps w/2 rails and cga.  Gait 668fw/min assist, reaches into pants and pulls out his penis during gait stating "look at this", pt instructed not to do this and again educated on inappropriate behavior.  Pt complies and easily redirected.   In wc pt asks to "have his way" w/therapist.  Increasingly difficult to redirect but continues to cooperate w/mobility tasks despite this.   Gait 7532fallway to bed, removes his gait belt mult times then becomes so distracted by this belt removed by therapist and set aside, stand to sit on edge of bed w/min assist.  Removes shoes w/supervision/spontaneously, sit to supine w/supervision.  Wrist restraints fastened.  Pt left supine w/rails up x 4 alarm set, bed in lowest position, and needs in reach.   Therapy Documentation Precautions:  Precautions Precautions: Fall Precaution Comments: Trach, cortrak, Lt hemiparesis Restrictions Weight Bearing Restrictions: No  Pain: Pain Assessment Pain Scale: 0-10 Pain Score: 0-No pain Agitated Behavior Scale: TBI Observation Details Observation Environment: CIR Start of observation period - Date: 01/31/21 Start of observation period - Time: 0900 End of observation period - Date: 01/31/21 End of observation period - Time: 1000 Agitated Behavior Scale (DO NOT LEAVE BLANKS) Short attention span, easy distractibility, inability to concentrate: Present  to a moderate degree Impulsive, impatient, low tolerance for pain or frustration: Present to a slight degree Uncooperative, resistant to care, demanding: Absent Violent and/or threatening violence toward people or property: Absent Explosive and/or unpredictable anger: Absent Rocking, rubbing, moaning, or other self-stimulating behavior: Absent Pulling at tubes, restraints, etc.: Absent Wandering from treatment areas: Absent Restlessness, pacing,  excessive movement: Present to a slight degree Repetitive behaviors, motor, and/or verbal: Present to a slight degree Rapid, loud, or excessive talking: Absent Sudden changes of mood: Absent Easily initiated or excessive crying and/or laughter: Absent Self-abusiveness, physical and/or verbal: Absent Agitated behavior scale total score: 19   Therapy/Group: Individual Therapy Callie Fielding, [place] 01/31/2021, 10:37 AM
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
## 2021-02-01 DIAGNOSIS — I10 Essential (primary) hypertension: Secondary | ICD-10-CM | POA: Diagnosis not present

## 2021-02-01 DIAGNOSIS — R1312 Dysphagia, oropharyngeal phase: Secondary | ICD-10-CM | POA: Diagnosis not present

## 2021-02-01 DIAGNOSIS — Z930 Tracheostomy status: Secondary | ICD-10-CM | POA: Diagnosis not present

## 2021-02-01 DIAGNOSIS — S069X2S Unspecified intracranial injury with loss of consciousness of 31 minutes to 59 minutes, sequela: Secondary | ICD-10-CM | POA: Diagnosis not present

## 2021-02-01 LAB — GLUCOSE, CAPILLARY
Glucose-Capillary: 138 mg/dL — ABNORMAL HIGH (ref 70–99)
Glucose-Capillary: 174 mg/dL — ABNORMAL HIGH (ref 70–99)
Glucose-Capillary: 107 mg/dL — ABNORMAL HIGH (ref 70–99)
Glucose-Capillary: 134 mg/dL — ABNORMAL HIGH (ref 70–99)

## 2021-02-01 NOTE — Progress Notes (Signed)
Physical Therapy TBI Note  Patient Details  Name: Scott Day MRN: [id number] Date of Birth: 1960/07/14  Today's Date: 02/01/2021 PT Individual Time: 1005-1110 PT Individual Time Calculation (min): 65 min   Short Term Goals: Week 2:  PT Short Term Goal 1 (Week 2): Patient will perfom bed mobility with supervision consistently. PT Short Term Goal 2 (Week 2): Patient will perform basic transfers with min A of 1 person consistently. PT Short Term Goal 3 (Week 2): Patient will ambulate >100 feet with min A, 1 person assist. PT Short Term Goal 4 (Week 2): Patient will attend to the L with mod cues >50% of the time.  Skilled Therapeutic Interventions/Progress Updates:     Patient in bed with his daughter in the room upon PT arrival. Patient alert and agreeable to PT session. Patient denied pain during session. Patient with B soft wrist restraints secured, patient's daughter reports nursing did not allow her to remove them today. Per behavior plan patient able to have soft wrist restraints removed with family in the room, patient also without trach or NG tube at this time, will discuss with nursing and rehab team about need for soft wrist restraints any longer without risk of pulling at tubes.   Patient reported poor sleep last night, per sleep chart patient slept at least 6 hours, however, patient reports this is inaccurate and stated that he feels tired this morning. Patient requested to go outside, recalled this PT stating that he could go outside last week, PT reminded him that he was overstimulation on Thursday afternoon when outdoor session was planned, but agreeable to outdoor session this morning. Provided patient with a blanket to prevent patient from getting cold outside. Patient very appreciative of time outside.   Patient tolerated sitting outside >20 min with increased environmental stimulation, required 2 cues for maintaining appropriate comments in a public setting. Patient able to  identify the season, "fall" once outside and recall what he would hunt for in the fall.   Therapeutic Activity: Bed Mobility: Patient performed supine to/from sit with min A-CGA for initiation. Provided verbal cues for initiation and spatial awareness for safety. Transfers: Patient performed stand pivot bed>TIS w/c and sit to/from stand x3 from TIS w/c with CGA x1 and supervision x2. Provided verbal cues for initiation and reaching back to sit for safety. Patient performed static standing balance x30 sec and x1 min before losing attention to task and returning to sitting spontaneously. Patient sat on the bed reporting fatigue and wanting to return to lying after being outside.   Gait Training:  Patient ambulated >150 feet using R HHA with min A-CGA over unlevel sidewalk outside the [place]. Ambulated with decreased gait speed, step height and length, mild narrow BOS, and decreased L attention and spatial awareness throughout. Provided mod multimodal cues for attention to L with his daughter on the L and visual and audible target/stimulation.  Wheelchair Mobility:  Patient was transported in the [place] w/c with total A to/from [facility] entrance during session for energy conservation and time management.  Patient in bed with his daughter at bedside, 4 rails up for safety, Telesitter in place at end of session with breaks locked, bed alarm set, and all needs within reach. Soft wrist restraints remained off at end of session with his  daughter in the room per behavior plan.   Therapy Documentation Precautions:  Precautions Precautions: Fall Precaution Comments: [name], Lt hemiparesis Restrictions Weight Bearing Restrictions: No General: PT Amount of Missed Time (min):  10 Minutes PT Missed Treatment Reason: Patient fatigue Agitated Behavior Scale: TBI Observation Details Observation Environment: Pt room, outside [facility] Start of observation period - Date: 02/01/21 Start of observation period  - Time: 1005 End of observation period - Date: 02/01/21 End of observation period - Time: 1110 Agitated Behavior Scale (DO NOT LEAVE BLANKS) Short attention span, easy distractibility, inability to concentrate: Present to a moderate degree Impulsive, impatient, low tolerance for pain or frustration: Present to a slight degree Uncooperative, resistant to care, demanding: Absent Violent and/or threatening violence toward people or property: Absent Explosive and/or unpredictable anger: Absent Rocking, rubbing, moaning, or other self-stimulating behavior: Absent Pulling at tubes, restraints, etc.: Absent Wandering from treatment areas: Absent Restlessness, pacing, excessive movement: Absent Repetitive behaviors, motor, and/or verbal: Present to a moderate degree Rapid, loud, or excessive talking: Absent Sudden changes of mood: Absent Easily initiated or excessive crying and/or laughter: Absent Self-abusiveness, physical and/or verbal: Absent Agitated behavior scale total score: 19    Therapy/Group: Individual Therapy  Ojani Berenson L Kirstine Jacquin PT, DPT  02/01/2021, 12:11 PM
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
## 2021-02-02 ENCOUNTER — Ambulatory Visit (INDEPENDENT_AMBULATORY_CARE_PROVIDER_SITE_OTHER): Payer: Medicaid Other

## 2021-02-02 DIAGNOSIS — I639 Cerebral infarction, unspecified: Secondary | ICD-10-CM

## 2021-02-02 DIAGNOSIS — I10 Essential (primary) hypertension: Secondary | ICD-10-CM | POA: Diagnosis not present

## 2021-02-02 DIAGNOSIS — S069X2S Unspecified intracranial injury with loss of consciousness of 31 minutes to 59 minutes, sequela: Secondary | ICD-10-CM | POA: Diagnosis not present

## 2021-02-02 DIAGNOSIS — Z930 Tracheostomy status: Secondary | ICD-10-CM | POA: Diagnosis not present

## 2021-02-02 DIAGNOSIS — R1312 Dysphagia, oropharyngeal phase: Secondary | ICD-10-CM | POA: Diagnosis not present

## 2021-02-02 LAB — CBC
HCT: 35 % — ABNORMAL LOW (ref 39.0–52.0)
Hemoglobin: 11.2 g/dL — ABNORMAL LOW (ref 13.0–17.0)
MCH: 29.6 pg (ref 26.0–34.0)
MCHC: 32 g/dL (ref 30.0–36.0)
MCV: 92.3 fL (ref 80.0–100.0)
Platelets: 273 10*3/uL (ref 150–400)
RBC: 3.79 MIL/uL — ABNORMAL LOW (ref 4.22–5.81)
RDW: 12.3 % (ref 11.5–15.5)
WBC: 5.7 10*3/uL (ref 4.0–10.5)
nRBC: 0 % (ref 0.0–0.2)

## 2021-02-02 LAB — GLUCOSE, CAPILLARY
Glucose-Capillary: 114 mg/dL — ABNORMAL HIGH (ref 70–99)
Glucose-Capillary: 154 mg/dL — ABNORMAL HIGH (ref 70–99)
Glucose-Capillary: 120 mg/dL — ABNORMAL HIGH (ref 70–99)
Glucose-Capillary: 161 mg/dL — ABNORMAL HIGH (ref 70–99)

## 2021-02-02 LAB — BASIC METABOLIC PANEL
Anion gap: 6 (ref 5–15)
BUN: 10 mg/dL (ref 6–20)
CO2: 29 mmol/L (ref 22–32)
Calcium: 9.1 mg/dL (ref 8.9–10.3)
Chloride: 103 mmol/L (ref 98–111)
Creatinine, Ser: 0.69 mg/dL (ref 0.61–1.24)
GFR, Estimated: >60 mL/min (ref >60)
Glucose, Bld: 110 mg/dL — ABNORMAL HIGH (ref 70–99)
Potassium: 3.9 mmol/L (ref 3.5–5.1)
Sodium: 138 mmol/L (ref 135–145)

## 2021-02-02 NOTE — Progress Notes (Signed)
Occupational Therapy TBI Note  Patient Details  Name: Scott Day MRN: [id number] Date of Birth: 1960/11/17  Today's Date: 02/02/2021 OT Individual Time: 4210-3128 OT Individual Time Calculation (min): 41 min    Short Term Goals: Week 2:  OT Short Term Goal 1 (Week 2): Pt will improve motor planning demo-ed by donning shirt with mod A OT Short Term Goal 2 (Week 2): Pt will require no more than mod cueing for sustained attention to ADL task for 1 min OT Short Term Goal 3 (Week 2): Pt will complete toileting tasks with min A  Skilled Therapeutic Interventions/Progress Updates:    Pt greeted semi-reclined in bed with daughter present. Pt needed more than reasonable amount of time to initiate bed mobility and min A. Mod A to don shoes at EOB, then min A stand-pivot to TIS wc. Pt brought to therapy gym and worked on functional use of L UE with mofified CIMT technique to keep pt from using R hand. B UE task using 1 lb dowel rod for chest press and bicep curl, guided A from OT to maintain attention and correct form. Pt returne dto room and stood to urinate with min A for balance and min A to place urinal. Pt left seated in TIS wc with alarm belt on, call bell in reach and needs met.   Therapy Documentation Precautions:   Pain: Pain Assessment Pain Scale: 0-10 Pain Score: 0-No pain Agitated Behavior Scale: TBI Observation Details Observation Environment: Room Start of observation period - Date: 02/02/21 Start of observation period - Time: 1115 End of observation period - Date: 02/02/21 End of observation period - Time: 1200 Agitated Behavior Scale (DO NOT LEAVE BLANKS) Short attention span, easy distractibility, inability to concentrate: Present to a moderate degree Impulsive, impatient, low tolerance for pain or frustration: Present to a slight degree Uncooperative, resistant to care, demanding: Absent Violent and/or threatening violence toward people or property: Absent Explosive  and/or unpredictable anger: Absent Rocking, rubbing, moaning, or other self-stimulating behavior: Absent Pulling at tubes, restraints, etc.: Absent Wandering from treatment areas: Absent Restlessness, pacing, excessive movement: Absent Repetitive behaviors, motor, and/or verbal: Absent Rapid, loud, or excessive talking: Absent Sudden changes of mood: Present to a slight degree Easily initiated or excessive crying and/or laughter: Absent Self-abusiveness, physical and/or verbal: Absent Agitated behavior scale total score: 18 Denies pain  Therapy/Group: Individual Therapy  Valma Cava 02/02/2021, 3:01 PM

## 2021-02-02 NOTE — Progress Notes (Signed)
Physical Therapy TBI Note  Patient Details  Name: Scott Day MRN: [id number] Date of Birth: 05/03/1960  Today's Date: 02/02/2021 PT Individual Time: 0910-1015 and  1340-1450 PT Individual Time Calculation (min): 65 min and 70 min   Short Term Goals: Week 3:  PT Short Term Goal 1 (Week 3): Patient will perform bed mobility with supervision consistently. PT Short Term Goal 2 (Week 3): Patient will perform basic transfers with CGA consistently. PT Short Term Goal 3 (Week 3): Patient will ambulate >100 feet with CGA. PT Short Term Goal 4 (Week 3): Patient will participate in 1 standardized balance assessment.  Skilled Therapeutic Interventions/Progress Updates:     Session 1: Patient sitting in TIS w/c dressed with tennis shoes donned and his daughter in the room upon PT arrival. Patient alert and agreeable to PT session. Patient denied pain during session.  Patient demonstrated improved awareness of deficits during session stating, "I am not walking well," and "my brain isn't working right." He was oriented to self, place, and situation and stated he did not know the date. Provided orientation to time during session. Demonstrated improved attention to tasks, participating in 2 standardized outcome measures this session. He was able to recall trimming his beard yesterday and going outside and what he saw outside with this PT with min cues.   Therapeutic Activity: Bed Mobility: Patient performed supine to/from sit with supervision. Provided verbal cues and increased time for initiation. Transfers: Patient performed sit to/from stand from the TIS w/c, bed, and standard arm chair with CGA-close supervision. Provided verbal cues for initiation and safety awareness while sitting to control descent and bring L hip back before sitting. Five times Sit to Stand Test (FTSS) Method: Use a straight back chair with a solid seat that is 16-18" high. Ask participant to sit on the chair with arms folded  across their chest.   Instructions: "Stand up and sit down as quickly as possible 5 times, keeping your arms folded across your chest."   Measurement: Stop timing when the participant stands the 5th time.  TIME: __44____ (in seconds) with max cues for sequencing  Times > 13.6 seconds is associated with increased disability and morbidity (Guralnik, 2000) Times > 15 seconds is predictive of recurrent falls in healthy individuals aged 54 and older (Buatois, et al., 2008) Normal performance values in community dwelling individuals aged 51 and older (Bohannon, 2006): 60-69 years: 11.4 seconds 70-79 years: 12.6 seconds 80-89 years: 14.8 seconds  MCID: ? 2.3 seconds for Vestibular Disorders (Meretta, 2006)  Gait Training:  6 Min Walk Test:  Instructed patient to ambulate as quickly and as safely as possible for 6 minutes using LRAD. Patient was allowed to take standing rest breaks without stopping the test, but if the patient required a sitting rest break the clock would be stopped and the test would be over.  Results: 816 feet (249 meters, Avg speed 0.7 m/s) completing the full 6 min using intermittent HHA with CGA and intermittent min A, RPE 12/10 at end of test vitals WFL. Results indicate that the patient has reduced endurance with ambulation compared to age matched norms (52-69 y.o males = 69 m).  Patient ambulated >100 feet x2 without AD with CGA. Ambulated with decreased L arm swing, decreased gait speed, decreased L step length and height, and decreased L visual scanning. Provided mod multimodal cues for L visual and motor inattention throughout. Patient ascended/descended 12x6" steps using B rails with CGA. Performed reciprocal gait pattern throughout. Provided mod  cues for attention to task and technique for safety.   Therapeutic Exercise: Patient performed the following exercises with mod-max verbal and tactile cues for proper technique. -L shoulder flexion and abduction PROM with  manual facilitation for scapular rotation x10 -active hand to back of head for L shoulder flexion, abduction, and external rotation x5 -active B shoulder flexion hold a ball with elbows straight with tactile cues for L elbow extension and shoulder flexion x5  Patient self selected to lie down int he bed with his daughter at bedside at end of session with breaks locked, bed alarm set, Telesitter in place and all needs within reach. Restraints off at end of session due to continuous supervision from family, RN made aware.   Session 2: Patient in [place] w/c with his daughter preparing to leave and handed off from [place], [place] upon PT arrival. Patient alert and agreeable to PT session. Patient denied pain during session.  Patient initially restless and impulsive, attempting to stand up with seat belt alarm and waist belt restraint secured. Able to redirect patient for safety. Patient stood spontaneously x2 to get up to grab pudding then honey thick apple juice left on tray table by SLP. Patient ate the pudding cup and drank the juice in sitting with min cues for small bites/sips.   Patient with increased inappropriate behaviors and signs of fatigue throughout session. Patient required redirection for appropriate touching, holding therapist's hand, versus inappropriate touching, around therapist's waist or hips/backside several times during session. At end of session when cued to return to his bed, patient spontaneously pulled down his shorts and brief and told PT to "join" him. PT brought awareness to this as inappropriate behavior and patient stated understanding and was agreeable to pull up his brief, but politely requested to take off his shorts while lying in bed. Patient continued to make inappropriate comments while in the bed, but no further behaviors at this time.   Therapeutic Activity: Bed Mobility: Patient performed supine to/from sit with supervision on a mat table and sit to supine on a flat bed  without use of bed rails with supervision. Provided verbal cues for initiation. Patient spontaneously laid on the mat table during session and was provided a 5 min lying rest break due to fatigue.  Transfers: Patient performed sit to/from stand X6 with close supervision. Provided verbal cues for safety awareness due to intermittent impulsivity and for bringing his L hip back for safety when sitting.  Gait Training:  Patient ambulated >80 feet x2 without AD. Ambulated as above.   Neuromuscular Re-ed: Patient performed the following activities to address standing balance and L inattention: -standing reaching to pick up bean bags with his L hand and throw them at the [facility] board with his L hand >2 min x2, required max multimodal cues to use his L hand throughout with patient intermittently switching hands to throw with his R -patient picked up bean bags from the cornhole board and floor from standing with min A for balance x2 -performed seated contralateral reaching alternating R/L focused on full trunk rotation and elbow extension 2x10 -sit to/from stand 2x5 without upper extremity support with therapist performing the same task next to patient for reduced verbal cuing and increased speed  Therapeutic Exercise: Patient performed the following exercises with verbal and tactile cues for proper technique. -lateral trunk stretch in sitting R/L 2x10 sec -AAROM L shoulder flexion x10 -active hand to back of head for L shoulder flexion, abduction, and external rotation x5  Patient in bed with soft waist belt secured and Telesitter in place at end of session with breaks locked, bed alarm set, and all needs within reach.   Therapy Documentation Precautions:  Precautions Precautions: Fall Precaution Comments: [name], Lt hemiparesis Restrictions Weight Bearing Restrictions: No General: PT Amount of Missed Time (min): 10 Minutes PT Missed Treatment Reason: Patient fatigue Agitated  Behavior Scale: TBI Observation Details Observation Environment: Room Start of observation period - Date: 02/02/21 Start of observation period - Time: 1340 End of observation period - Date: 02/02/21 End of observation period - Time: 1450 Agitated Behavior Scale (DO NOT LEAVE BLANKS) Short attention span, easy distractibility, inability to concentrate: Present to a moderate degree Impulsive, impatient, low tolerance for pain or frustration: Present to a slight degree Uncooperative, resistant to care, demanding: Absent Violent and/or threatening violence toward people or property: Absent Explosive and/or unpredictable anger: Absent Rocking, rubbing, moaning, or other self-stimulating behavior: Absent Pulling at tubes, restraints, etc.: Absent Wandering from treatment areas: Absent Restlessness, pacing, excessive movement: Absent Repetitive behaviors, motor, and/or verbal: Present to a slight degree Rapid, loud, or excessive talking: Absent Sudden changes of mood: Absent Easily initiated or excessive crying and/or laughter: Absent Self-abusiveness, physical and/or verbal: Absent Agitated behavior scale total score: 18 Agitated Behavior Scale: TBI Observation Details Observation Environment: Room Start of observation period - Date: 02/02/21 Start of observation period - Time: 0910 End of observation period - Date: 02/02/21 End of observation period - Time: 1015 Agitated Behavior Scale (DO NOT LEAVE BLANKS) Short attention span, easy distractibility, inability to concentrate: Present to a moderate degree Impulsive, impatient, low tolerance for pain or frustration: Present to a slight degree Uncooperative, resistant to care, demanding: Absent Violent and/or threatening violence toward people or property: Absent Explosive and/or unpredictable anger: Absent Rocking, rubbing, moaning, or other self-stimulating behavior: Absent Pulling at tubes, restraints, etc.: Absent Wandering from  treatment areas: Absent Restlessness, pacing, excessive movement: Absent Repetitive behaviors, motor, and/or verbal: Present to a slight degree Rapid, loud, or excessive talking: Absent Sudden changes of mood: Absent Easily initiated or excessive crying and/or laughter: Absent Self-abusiveness, physical and/or verbal: Absent Agitated behavior scale total score: 18    Therapy/Group: Individual Therapy  Arlone Lenhardt L Saavi Mceachron PT, DPT  02/02/2021, 3:57 PM
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
## 2021-02-02 NOTE — Progress Notes (Addendum)
Speech Language Pathology Weekly Progress and TBI Note  Patient Details  Name: Scott Day MRN: [id number] Date of Birth: 1960-07-26  Beginning of progress report period: January 26, 2021 End of progress report period: February 02, 2021  Today's Date: 02/02/2021 SLP Individual Time: 1300-1330 SLP Individual Time Calculation (min): 30 min  Short Term Goals: Week 2: SLP Short Term Goal 1 (Week 2): Patient will demonstrate orientation X 4 with Mod visual and verbal cues. SLP Short Term Goal 1 - Progress (Week 2): Progressing toward goal SLP Short Term Goal 2 (Week 2): Patient will demonstrate sustained attention to tasks for 2 minutes with Mod verbal cues for redirection. SLP Short Term Goal 2 - Progress (Week 2): Progressing toward goal SLP Short Term Goal 3 (Week 2): Patient will demonstrate 75% speech intelligibility at the word level with Min A verbal and visual cues for use of speech intelligibility strategies. SLP Short Term Goal 3 - Progress (Week 2): Met SLP Short Term Goal 4 (Week 2): Patient will consume Dys. 1 textures with honey-thick liquids with minimal overt s/sx of aspiration and mod A verbal/visual cues for use of swallowing compensatory strategies. SLP Short Term Goal 4 - Progress (Week 2): Met    New Short Term Goals: Week 3: SLP Short Term Goal 1 (Week 3): Patient will demonstrate 90% speech intelligibility at the word level with Min A verbal and visual cues for use of speech intelligibility strategies. SLP Short Term Goal 2 (Week 3): Patient will demonstrate sustained attention to tasks for 2-3 minutes with Mod verbal cues for redirection. SLP Short Term Goal 3 (Week 3): Patient will consume trials of NTL and Dys 2 textures with minimal overt s/sx of aspiration and min A verbal/visual cues for use of swallowing compensatory strategies. SLP Short Term Goal 4 (Week 3): Pt will use external memory notebook to increase recall of functional information to reduce agitation  and caregiver burden SLP Short Term Goal 5 (Week 3): Patient will demonstrate orientation X 4 with Mod visual and verbal cues.  Weekly Progress Updates: Pt has made slow but steady gains and has met 2 out of 4 short term goals this reporting period. Pt is progressing toward 2 unmet goals in areas of orientation and attention. Pt is currently tolerating a Dys 1 diet with honey thick liquids with min-mod A cues for swallow strategies. Pt is limited by sustained attention to task and poor awareness, some days better than others. Daughter continues to be very involved in rehab process and provided extensive education on safety precautions, compensatory strategies and goals of care. Pt speech intelligibility <75% with min multimodal cues and has more consistency in orientation to self and reason for hospitalization. Anticipate patient will discharge with 24/7 supervision due to cognitive deficits.   Intensity: Minumum of 1-2 x/day, 30 to 90 minutes Frequency: 3 to 5 out of 7 days Duration/Length of Stay: 11/28 Treatment/Interventions: Cognitive remediation/compensation;Dysphagia/aspiration precaution training;Internal/external aids;Speech/Language facilitation;Therapeutic Activities;Environmental Environmental consultant;Therapeutic Exercise;Patient/family education;Multimodal communication approach;Functional tasks   Daily Session  Skilled Therapeutic Interventions: Pt seen for skilled ST with focus on cognitive and swallow goals, daughter present throughout. Pt oriented to self independently, states the month as "2" but able to state "November" given max cues. Pt making inappropriate comments throughout session but redirected easily. Pt tolerating snack of Dys 1/HTL with min A cues for small sips and slow rate, no overt s/s aspiration across any trials. Daughter reports her goals for dad is to become more oriented and increase awareness  into deficits to reduce caregiver burden. SLP providing pt with  memory notebook to start to increase recall and carryover of visitors, staff, exercises, etc for pt to refer to. SLP also hanging calendar in his room to increase orientation concepts. Average sustained attention to task ~30 seconds this date provided mod multimodal cues, pt becoming slightly agitated at end of session and attempting to stand from wheelchair. Pt repositioned in wheelchair and left with daughter present for all needs. Cont ST POC.     General    Pain Pain Assessment Pain Scale: 0-10 Pain Score: 0-No pain  Agitated Behavior Scale: TBI Observation Details Observation Environment: pt room Start of observation period - Date: 02/02/21 Start of observation period - Time: 1300 End of observation period - Date: 02/02/21 End of observation period - Time: 1330 Agitated Behavior Scale (DO NOT LEAVE BLANKS) Short attention span, easy distractibility, inability to concentrate: Present to a moderate degree Impulsive, impatient, low tolerance for pain or frustration: Present to a slight degree Uncooperative, resistant to care, demanding: Absent Violent and/or threatening violence toward people or property: Absent Explosive and/or unpredictable anger: Absent Rocking, rubbing, moaning, or other self-stimulating behavior: Absent Pulling at tubes, restraints, etc.: Absent Wandering from treatment areas: Absent Restlessness, pacing, excessive movement: Absent Repetitive behaviors, motor, and/or verbal: Present to a slight degree Rapid, loud, or excessive talking: Absent Sudden changes of mood: Absent Easily initiated or excessive crying and/or laughter: Absent Self-abusiveness, physical and/or verbal: Absent Agitated behavior scale total score: 18  Therapy/Group: Individual Therapy  Dewaine Conger 02/02/2021, 3:55 PM

## 2021-02-02 NOTE — Progress Notes (Signed)
Physical Therapy Weekly Progress Note  Patient Details  Name: Scott Day MRN: [id number] Date of Birth: Aug 15, 1960  Beginning of progress report period: January 26, 2021 End of progress report period: February 02, 2021  Today's Date: 02/02/2021  Patient has met 3 of 4 short term goals.  Patient making steady progress this week toward functional goals. Currently performs bed mobility with min A-supervision, assist for initiation from lying, transfers, gait, and stairs with min A-CGA HHA or B rails, ambulating >200 feet and up to 8 steps. Patient continues to present with decreased orientation and increased confusion, inappropriate behaviors and language improving to <50% of the time unless overstimulated prior to session.   Patient continues to demonstrate the following deficits decreased cardiorespiratoy endurance, decreased attention to left and decreased motor planning, decreased initiation, decreased attention, decreased awareness, decreased problem solving, decreased safety awareness, decreased memory, and delayed processing, and decreased sitting balance, decreased standing balance, decreased postural control, hemiplegia, decreased balance strategies, and difficulty maintaining precautions and therefore will continue to benefit from skilled PT intervention to increase functional independence with mobility.  Patient progressing toward long term goals..  Continue plan of care.  PT Short Term Goals Week 2:  PT Short Term Goal 1 (Week 2): Patient will perfom bed mobility with supervision consistently. PT Short Term Goal 1 - Progress (Week 2): Progressing toward goal PT Short Term Goal 2 (Week 2): Patient will perform basic transfers with min A of 1 person consistently. PT Short Term Goal 2 - Progress (Week 2): Met PT Short Term Goal 3 (Week 2): Patient will ambulate >100 feet with min A, 1 person assist. PT Short Term Goal 3 - Progress (Week 2): Met PT Short Term Goal 4 (Week 2): Patient  will attend to the Scott Day with mod cues >50% of the time. PT Short Term Goal 4 - Progress (Week 2): Met Week 3:  PT Short Term Goal 1 (Week 3): Patient will perform bed mobility with supervision consistently. PT Short Term Goal 2 (Week 3): Patient will perform basic transfers with CGA consistently. PT Short Term Goal 3 (Week 3): Patient will ambulate >100 feet with CGA. PT Short Term Goal 4 (Week 3): Patient will participate in 1 standardized balance assessment.   Therapy Documentation Precautions:  Precautions Precautions: Fall Precaution Comments: Scott Day, Lt hemiparesis Restrictions Weight Bearing Restrictions: No   Therapy/Group: Individual Therapy  Scott Day Scott Day Scott Day PT, DPT  02/02/2021, 8:03 AM

## 2021-02-03 DIAGNOSIS — S069X2S Unspecified intracranial injury with loss of consciousness of 31 minutes to 59 minutes, sequela: Secondary | ICD-10-CM | POA: Diagnosis not present

## 2021-02-03 DIAGNOSIS — I10 Essential (primary) hypertension: Secondary | ICD-10-CM | POA: Diagnosis not present

## 2021-02-03 DIAGNOSIS — Z930 Tracheostomy status: Secondary | ICD-10-CM | POA: Diagnosis not present

## 2021-02-03 DIAGNOSIS — R1312 Dysphagia, oropharyngeal phase: Secondary | ICD-10-CM | POA: Diagnosis not present

## 2021-02-03 LAB — GLUCOSE, CAPILLARY
Glucose-Capillary: 113 mg/dL — ABNORMAL HIGH (ref 70–99)
Glucose-Capillary: 129 mg/dL — ABNORMAL HIGH (ref 70–99)
Glucose-Capillary: 113 mg/dL — ABNORMAL HIGH (ref 70–99)
Glucose-Capillary: 165 mg/dL — ABNORMAL HIGH (ref 70–99)

## 2021-02-03 LAB — VALPROIC ACID LEVEL: Valproic Acid Lvl: 41 ug/mL — ABNORMAL LOW (ref 50.0–100.0)

## 2021-02-03 NOTE — Progress Notes (Signed)
Slept good. No inappropriate behaviors thus far. 0.45% NS infusing at 75cc's/hr X 12 hours. Foam dressing to left hand. Trach dressing C D & I. Continues to require waist belt for safety. Patient with poor safety awareness & impulsive. Bed alarm and telesitter also in use for patient's safety. Patrici Ranks A

## 2021-02-03 NOTE — Progress Notes (Signed)
Physical Therapy TBI Note  Patient Details  Name: Scott Day MRN: [id number] Date of Birth: [birth year]  Today's Date: 02/03/2021 PT Individual Time: 1050-1130 1300-1330 PT Individual Time Calculation (min): 40 min   Short Term Goals: Week 3:  PT Short Term Goal 1 (Week 3): Patient will perform bed mobility with supervision consistently. PT Short Term Goal 2 (Week 3): Patient will perform basic transfers with CGA consistently. PT Short Term Goal 3 (Week 3): Patient will ambulate >100 feet with CGA. PT Short Term Goal 4 (Week 3): Patient will participate in 1 standardized balance assessment.  Skilled Therapeutic Interventions/Progress Updates:     Session 1: Patient ambulating on [address] hall outside his room with [place], [place], providing HHA upon PT arrival. Patient alert and agreeable to PT session. Patient denied pain, but reported stomach upset during session.  Per NT, patient restless and becoming frustrated sitting up in the chair in his room, NT opted to allow patient to ambulate with assistance in the hall to deescalate behaviors appropriately. Patient ambulating safely following NT cues with min A-CGA. Cleared staff to ambulate with patient in the halls for behavior management due to improved mobility and balance, and to reduce restlessness. Educated staff on holding his R hand to minimize inappropriate touching and improve patient's response to cues due to L inattention. RN and NT in agreement and safety plan updated in patient's room.   Patient ambulated >100 feet x2 using R HHA with CGA and intermittent min A. Patient required mod-max cues to attend to task with increased external distractibility today, stopping to read signs or look around at other people. During a seated rest break between trials patient answered orientation questions with correct answers for self, location, situation, and mod A cue for correct month and year. Patient unable to recall that he had a TBI initially, but  able with min cuing from therapist. Patient suddenly reported "my stomach is sick" and stated yes when asked if he needed to go to the bathroom. Returned to his room, as above. Patient performed toileting with supervision with cues to attend to task, reduced perseveration on wrapping toilet paper around his hand, and thoroughness when performing peri-care. Patient was continent of bowl and bladder during toileting and performed lower body clothing management with supervision and min cues. Patient performed toilet transfer, standing balance, and squatting to pull up his brief and shorts with close supervision-CGA. Patient ambulated to the bed with HHA to redirect patient from wrapping his arms around therapist. Easily redirectable with R hand held and correcting patient on appropriate versus inappropriate behaviors.   Patient doffed his R shoe independently and required total A due to inattention on the L. He laid down in the bed with supervision and reported feeling cold and his stomach was upset. Patient requested to rest at this time. Patient missed 20 min of skilled PT due to fatigue/stomach upset, RN made aware. Will attempt to make-up missed time as able.    Patient in bed at end of session with breaks locked, soft waist belt secured, Telesitter in the romm, bed alarm set, and all needs within reach.   Session 2: Patient in bed upon PT arrival. Patient alert and agreeable to PT session. Patient denied pain during session. NT reported that patient declined sitting up to eat lunch this afternoon. Patient reports that he is tired today, does state his stomach feels better than earlier.   Utilize therapeutic use of self to encourage patient to sit EOB to  eat lunch. Patient sat EOB >15 min with supervision postural control in unsupported sitting to eat lunch. Demonstrated safe pacing and bite size and without signs of aspiration throughout. Patient able to attend to a conversation, with PT sitting on his L  while he ate. Discussed last night's and today's activities recorded in his memory notebook with min cues for attention/redirection to topic of discussion. Patient unable to recall events, however, provided additional details about events once stated by PT. Memory notebook updated for PT sessions today and reviewed with patient. Once patient completed eating >75% of his meal and consuming 1 honey thick cranberry juice, he returned to lying independently.   Patient in bed with soft waist belt secured and Telesitter in the room at end of session with breaks locked, bed alarm set, and all needs within reach.   Therapy Documentation Precautions:  Precautions Precautions: Fall Precaution Comments: [name], Lt hemiparesis Restrictions Weight Bearing Restrictions: No General: PT Amount of Missed Time (min): 20 Minutes PT Missed Treatment Reason: Patient fatigue Agitated Behavior Scale: TBI Observation Details Observation Environment: [address] Start of observation period - Date: 02/03/21 Start of observation period - Time: 1050 End of observation period - Date: 02/03/21 End of observation period - Time: 1130 Agitated Behavior Scale (DO NOT LEAVE BLANKS) Short attention span, easy distractibility, inability to concentrate: Present to a moderate degree Impulsive, impatient, low tolerance for pain or frustration: Present to a slight degree Uncooperative, resistant to care, demanding: Absent Violent and/or threatening violence toward people or property: Absent Explosive and/or unpredictable anger: Absent Rocking, rubbing, moaning, or other self-stimulating behavior: Absent Pulling at tubes, restraints, etc.: Absent Wandering from treatment areas: Present to a slight degree Restlessness, pacing, excessive movement: Absent Repetitive behaviors, motor, and/or verbal: Present to a slight degree Rapid, loud, or excessive talking: Absent Sudden changes of mood: Absent Easily initiated or  excessive crying and/or laughter: Absent Self-abusiveness, physical and/or verbal: Absent Agitated behavior scale total score: 19 Agitated Behavior Scale: TBI Observation Details Observation Environment: [address] Start of observation period - Date: 02/03/21 Start of observation period - Time: 1300 End of observation period - Date: 02/03/21 End of observation period - Time: 1330 Agitated Behavior Scale (DO NOT LEAVE BLANKS) Short attention span, easy distractibility, inability to concentrate: Present to a moderate degree Impulsive, impatient, low tolerance for pain or frustration: Absent Uncooperative, resistant to care, demanding: Absent Violent and/or threatening violence toward people or property: Absent Explosive and/or unpredictable anger: Absent Rocking, rubbing, moaning, or other self-stimulating behavior: Absent Pulling at tubes, restraints, etc.: Absent Wandering from treatment areas: Absent Restlessness, pacing, excessive movement: Absent Repetitive behaviors, motor, and/or verbal: Present to a slight degree Rapid, loud, or excessive talking: Absent Sudden changes of mood: Absent Easily initiated or excessive crying and/or laughter: Absent Self-abusiveness, physical and/or verbal: Absent Agitated behavior scale total score: 17    Therapy/Group: Individual Therapy  Juelle Dickmann L Dodie Parisi PT, DPT  02/03/2021, 11:52 AM

## 2021-02-03 NOTE — Patient Care Conference (Signed)
Inpatient RehabilitationTeam Conference and Plan of Care Update Date: 02/03/2021   Time: 10:13 AM    Patient Name: Scott Day      Medical Record Number: [id number]  Date of Birth: 09-17-1960 Sex: Male         Room/Bed: 4W17C/4W17C-01 Payor Info: Payor: [facility] / Plan: [place] MEDICAID HEALTHY BLUE / Product Type: *No Product type* /    Admit Date/Time:  01/17/2021 11:48 AM  Primary Diagnosis:  TBI (traumatic brain injury)  Hospital Problems: Principal Problem:   TBI (traumatic brain injury)    Expected Discharge Date: Expected Discharge Date: 02/16/21  Team Members Present: Physician leading conference: Dr. Alger Simons Social Worker Present: [name], [place] Nurse Present: Dorthula Nettles, RN PT Present: Apolinar Junes, PT OT Present: Laverle Hobby, OT SLP Present: Weston Anna, SLP PPS Coordinator present : Gunnar Fusi, SLP     Current Status/Progress Goal Weekly Team Focus  Bowel/Bladder   Continent of B&B during day, incontient at night. LBM 11/14-continent  Regain Continence  Toilet Q 2-3 hrs   Swallow/Nutrition/ Hydration   Dys. 1 textures with honey-thick liquids via cup, Mod A for use of swallow strategies  Functional change in oropharyngeal swallow  tolerance of current diet, use of swallow strategies, trials of advanced solids and liquids   ADL's   Continues to make steady progress, mod A ADL d/t sequencing, but min A for transfers  supervision/CGA for transfers, min A ADLs  ADL retraining, attention, cognitive, self-regulation, transfers, family education   Mobility   Min A-CGA overall, gait 816 ft on 6MWT 11/15, 12 steps B rails  Supervision overall, gait >200 feet, 2 steps with LRAD  L attention, functional mobility, gait and stair training, L hemi-body NMR, activity tolerance, stimulation tolerance, attention and safety awareness, behavior management, patient/caregiver education   Communication   Min-Mod A for speech  intelligibility at sentence level  Supervision  appropriate social interactions, use of speech intelligibility strategies   Safety/Cognition/ Behavioral Observations  [place] Level V-VI, Overall Mod-Max A  Min A  orientation, attention, awareness   Pain   No complain of pain.  Remain Pain Free.  Assess Q shift and PRN   Skin   new orders to apply aquacell&dry gauze to old trach site. Foam dressing to left hand.  Prevent New skin breakdowns.  Assess Q shift and PRN     Discharge Planning:  Pt will d/c to home with intermittent support from his dtr and son. Dtr is working on 24/7 care for patient. Concerns with regard to [facility] services needed due to insurance. Seeing if pt is eligible for TBI waiver.   Team Discussion: Lurline Idol is out, may need some Silver Nitrate to the stoma. Will continue IVF until off honey thick liquids. Continent B/B, no pain. Can be sexually inappropriate, cues for appropriate behavior. Min assist/contact guard overall. Completed 12 steps with rails on each side. Right hand tends to be grabby, cues for appropriate behavior. Can take strong redirection. Trials of Nectar have been inconsistent. Swallowing appears to be better in the morning.   Patient on target to meet rehab goals: yes  *See Care Plan and progress notes for long and short-term goals.   Revisions to Treatment Plan:  Continue IVF  Teaching Needs: Family education, medication management, safety awareness, stair training, transfer training, etc.  Current Barriers to Discharge: Lack of/limited family support, Weight, Medication compliance, and Nutritional means  Possible Resolutions to Barriers: Family education Continue trials of Nectar to  advance diet. Set up HH/Outpatient if appropriate and eligible.      Medical Summary Current Status: improved behavior,sleep-wake. trach stoma closed. tolerating diet, still on IVF at hs with hone liquids.  Barriers to Discharge: Medical stability   Possible  Resolutions to Barriers/Weekly Focus: daily assessment of nutrition, behavior, pt data/labs/vs   Continued Need for Acute Rehabilitation Level of Care: The patient requires daily medical management by a physician with specialized training in physical medicine and rehabilitation for the following reasons: Direction of a multidisciplinary physical rehabilitation program to maximize functional independence : Yes Medical management of patient stability for increased activity during participation in an intensive rehabilitation regime.: Yes Analysis of laboratory values and/or radiology reports with any subsequent need for medication adjustment and/or medical intervention. : Yes   I attest that I was present, lead the team conference, and concur with the assessment and plan of the team.   Cristi Loron 02/03/2021, 12:52 PM

## 2021-02-03 NOTE — Progress Notes (Signed)
Occupational Therapy Weekly Progress Note  Patient Details  Name: Scott Day MRN: [id number] Date of Birth: January 30, 1961  Beginning of progress report period: January 27, 2021 End of progress report period: February 03, 2021  Today's Date: 02/03/2021 OT Individual Time: 8850-2774 OT Individual Time Calculation (min): 70 min    Patient has met 2 of 3 short term goals. Scott Day continues to make steady progress toward his goals. He requires mod A for UB/LB ADLs d/t attention, motor planning, and sequencing deficits. His transfers are min A overall. Family education has been ongoing informally and will be scheduled closer to d/c date.   Patient continues to demonstrate the following deficits: muscle weakness, decreased coordination and decreased motor planning, decreased attention to left, decreased initiation, decreased attention, decreased awareness, decreased problem solving, decreased safety awareness, decreased memory, and delayed processing, and decreased sitting balance, decreased standing balance, decreased postural control, hemiplegia, and decreased balance strategies and therefore will continue to benefit from skilled OT intervention to enhance overall performance with BADL and Reduce care partner burden.  Patient progressing toward long term goals..  Continue plan of care.  OT Short Term Goals Week 2:  OT Short Term Goal 1 (Week 2): Pt will improve motor planning demo-ed by donning shirt with mod A OT Short Term Goal 1 - Progress (Week 2): Met OT Short Term Goal 2 (Week 2): Pt will require no more than mod cueing for sustained attention to ADL task for 1 min OT Short Term Goal 2 - Progress (Week 2): Met OT Short Term Goal 3 (Week 2): Pt will complete toileting tasks with min A OT Short Term Goal 3 - Progress (Week 2): Progressing toward goal Week 3:  OT Short Term Goal 1 (Week 3): Pt will complete toileting tasks with min A OT Short Term Goal 2 (Week 3): Pt will don LB clothing with  no more than mod cueing OT Short Term Goal 3 (Week 3): Pt will sustain attention to ADL task for 1 min with min cueing OT Short Term Goal 4 (Week 3): Pt will don shirt with min A  Skilled Therapeutic Interventions/Progress Updates:    Pt received supine with nursing staff present assisting with incontinent BM. Pt more sexually inappropriate this session requiring both direct cueing/education ("do not touch me inappropriately, etc"), and redirection to task. Pt required multimodal cueing for initiation and attention to task. CGA for bed mobility overall. Min HHA for ambulatory transfer into the bathroom for shower. He was frequently redirected to task with very poor sustained attention. Pt completed UB bathing with mod cueing but (S) overall! Great improvement in L attention. Pt required min A for LB bathing. UB dressing with light mod A. Errorless learning techniques utilized to reinforce proper motor planning with LB dressing- mod A overall. Following shower pt climbed into bed into quadroped- despite cueing for proper technique. Pt took a brief supine rest break before coming to EOB with max A. Attempted to engage pt in therapeutic activity in the gym but he was cognitively too fatigued and unable to attend despite max cueing. Pt returned to his room and was left sitting up in the TIS w/c with all needs met. Chair alarm set.   Therapy Documentation Precautions:  Precautions Precautions: Fall Precaution Comments: Scott Day, Lt hemiparesis Restrictions Weight Bearing Restrictions: No   Therapy/Group: Individual Therapy  Scott Day 02/03/2021, 6:23 AM
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
## 2021-02-03 NOTE — Progress Notes (Signed)
Speech Language Pathology TBI Note  Patient Details  Name: Scott Day MRN: [id number] Date of Birth: 11/05/1960  Today's Date: 02/03/2021 SLP Individual Time: 9629-5284 SLP Individual Time Calculation (min): 45 min  Short Term Goals: Week 3: SLP Short Term Goal 1 (Week 3): Patient will demonstrate 90% speech intelligibility at the word level with Min A verbal and visual cues for use of speech intelligibility strategies. SLP Short Term Goal 2 (Week 3): Patient will demonstrate sustained attention to tasks for 2-3 minutes with Mod verbal cues for redirection. SLP Short Term Goal 3 (Week 3): Patient will consume trials of NTL and Dys 2 textures with minimal overt s/sx of aspiration and min A verbal/visual cues for use of swallowing compensatory strategies. SLP Short Term Goal 4 (Week 3): Pt will use external memory notebook to increase recall of functional information to reduce agitation and caregiver burden SLP Short Term Goal 5 (Week 3): Patient will demonstrate orientation X 4 with Mod visual and verbal cues.  Skilled Therapeutic Interventions: Skilled treatment session focused on dysphagia and cognitive goals. SLP facilitated session by providing extra time and Min verbal cues for safety during transfer from the bed to the wheelchair. Patient consumed a snack of Dys. 1 textures with honey-thick liquids. Patient consumed snack without overt s/s of aspiration with increased attention to bolus noted. Recommend patient continue current diet with trials of upgraded textures/liquids with SLP. Patient participated in a basic calendar task with Mod verbal cues needed for problem solving and Max A verbal cues needed for visual scanning to the left field of environment. Patient participated in a basic sorting task with coins from a field of 4 with Max A multimodal cues needed for problem solving and attention. Patient reported intermittent dizziness and requested to get back into bed. Mod verbal cues were  needed for safety with transfer to reduce impulsivity. Patient left upright in bed with waist belt in place and daughter present. Continue with current plan of care.      Pain No/Denies Pain   Agitated Behavior Scale: TBI Observation Details Observation Environment: Patient's room Start of observation period - Date: 02/03/21 Start of observation period - Time: 1330 End of observation period - Date: 02/03/21 End of observation period - Time: 1415 Agitated Behavior Scale (DO NOT LEAVE BLANKS) Short attention span, easy distractibility, inability to concentrate: Present to a moderate degree Impulsive, impatient, low tolerance for pain or frustration: Present to a slight degree Uncooperative, resistant to care, demanding: Absent Violent and/or threatening violence toward people or property: Absent Explosive and/or unpredictable anger: Absent Rocking, rubbing, moaning, or other self-stimulating behavior: Absent Pulling at tubes, restraints, etc.: Absent Wandering from treatment areas: Absent Restlessness, pacing, excessive movement: Absent Repetitive behaviors, motor, and/or verbal: Present to a slight degree Rapid, loud, or excessive talking: Absent Sudden changes of mood: Absent Easily initiated or excessive crying and/or laughter: Absent Self-abusiveness, physical and/or verbal: Absent Agitated behavior scale total score: 18  Therapy/Group: Individual Therapy  Kalyn Hofstra, [place] 02/03/2021, 3:12 PM

## 2021-02-03 NOTE — Progress Notes (Signed)
Incontinent of urine this Am, requiring moderate assistance to change linen and hygiene.Scott Day A

## 2021-02-03 NOTE — Progress Notes (Signed)
Patient ID: Scott Day, male   DOB: 14-Mar-1961, 61 y.o.   MRN: [id number]  SW called pt dtr [name] [phone]) to provide updates from team conference and d/c date remains 11/28. SW discussed community resources to help assist with Technical sales engineer. SW informed will provide more resources if able to find.   [name], MSW, [place] Office: [phone] Cell: [phone] Fax: [phone]

## 2021-02-04 LAB — GLUCOSE, CAPILLARY
Glucose-Capillary: 129 mg/dL — ABNORMAL HIGH (ref 70–99)
Glucose-Capillary: 136 mg/dL — ABNORMAL HIGH (ref 70–99)
Glucose-Capillary: 78 mg/dL (ref 70–99)
Glucose-Capillary: 146 mg/dL — ABNORMAL HIGH (ref 70–99)

## 2021-02-04 NOTE — Progress Notes (Signed)
Speech Language Pathology TBI Note  Patient Details  Name: Scott Day MRN: [id number] Date of Birth: [birth year]  Today's Date: 02/04/2021 SLP Individual Time: 2409-7353 SLP Individual Time Calculation (min): 55 min  Short Term Goals: Week 3: SLP Short Term Goal 1 (Week 3): Patient will demonstrate 90% speech intelligibility at the word level with Min A verbal and visual cues for use of speech intelligibility strategies. SLP Short Term Goal 2 (Week 3): Patient will demonstrate sustained attention to tasks for 2-3 minutes with Mod verbal cues for redirection. SLP Short Term Goal 3 (Week 3): Patient will consume trials of NTL and Dys 2 textures with minimal overt s/sx of aspiration and min A verbal/visual cues for use of swallowing compensatory strategies. SLP Short Term Goal 4 (Week 3): Pt will use external memory notebook to increase recall of functional information to reduce agitation and caregiver burden SLP Short Term Goal 5 (Week 3): Patient will demonstrate orientation X 4 with Mod visual and verbal cues.  Skilled Therapeutic Interventions: Skilled treatment session focused on cognitive and dysphagia goals. Upon arrival, patient was awake in bed and agreeable to treatment session. Patient reporting fatigue and required more than a reasonable amount of time to initiate siting EOB (!5 minutes). SLP utilized Max A multimodal cues with minimal success as patient reported, "he doesn't like to be rushed." Patient eventually transferred to the wheelchair with Min A verbal cues for safety. Patient consumed ~8 oz of nectar-thick liquids with minimal overt s/s of aspiration but required cues for swallow initiation due to poor attention to bolus. Patient participated in a basic calendar task with Max A verbal and visual cues for problem solving, left visual scanning and sustained attention for ~60 seconds. Patient reporting fatigue and requesting to return to bed. Patient transferred back to bed at end  of session and left with alarm on and all needs within reach.  Of note, patient with decreased inappropriate behaviors today both physically and verbally. Continue with current plan of care.      Pain No/Denies Pain   Agitated Behavior Scale: TBI Observation Details Observation Environment: Patient's room Start of observation period - Date: 02/04/21 Start of observation period - Time: 0900 End of observation period - Date: 02/04/21 End of observation period - Time: 0955 Agitated Behavior Scale (DO NOT LEAVE BLANKS) Short attention span, easy distractibility, inability to concentrate: Present to a moderate degree Impulsive, impatient, low tolerance for pain or frustration: Absent Uncooperative, resistant to care, demanding: Absent Violent and/or threatening violence toward people or property: Absent Explosive and/or unpredictable anger: Absent Rocking, rubbing, moaning, or other self-stimulating behavior: Absent Pulling at tubes, restraints, etc.: Absent Wandering from treatment areas: Absent Restlessness, pacing, excessive movement: Absent Repetitive behaviors, motor, and/or verbal: Present to a slight degree Rapid, loud, or excessive talking: Absent Sudden changes of mood: Absent Easily initiated or excessive crying and/or laughter: Absent Self-abusiveness, physical and/or verbal: Absent Agitated behavior scale total score: 17  Therapy/Group: Individual Therapy  Tiondra Fang 02/04/2021, 11:53 AM

## 2021-02-04 NOTE — Progress Notes (Signed)
Occupational Therapy TBI Note  Patient Details  Name: Scott Day MRN: [id number] Date of Birth: 02/01/1961  Today's Date: 02/04/2021 Session 1: OT Individual Time: 0730-0830 OT Individual Time Calculation (min): 60 min   Session 2:   Today's Date: 02/04/2021 OT Individual Time: 1050-1200 OT Individual Time Calculation (min): 70 min    Short Term Goals:  Week 3:  OT Short Term Goal 1 (Week 3): Pt will complete toileting tasks with min A OT Short Term Goal 2 (Week 3): Pt will don LB clothing with no more than mod cueing OT Short Term Goal 3 (Week 3): Pt will sustain attention to ADL task for 1 min with min cueing OT Short Term Goal 4 (Week 3): Pt will don shirt with min A  Skilled Therapeutic Interventions/Progress Updates:    Pt supine, sleeping but easily awoken. Pt with no c/o pain. Pt required mod multimodal cueing to initiate bed mobility and mod A overall. Pt required mod cueing for attention to task and increased time for processing. He was able to don shorts with min A with extra time and cueing! Pt completed sit > stand with min A for initiation assist and CGA for transfer to TIS w/c. He was taken to the therapy gym to eat breakfast- D1 diet and honey thick liquids. Min-mod cueing required for attention to task and to initiate swallow but no overt s/s of aspiration. Thick dressing over stoma removed per MD request and RN entered session to disconnect IV fluids. Pt more appropriate in terms of sexual comments this morning. Pt given cup of honey thickened coffee per his request- via teaspoon to control hot liquid. Pt returned to his room and was left supine with all needs met, bed alarm set.   Session 2: Pt received supine- obvious BM present on legs and sheets. Pt agreeable to shower. Pt completed bed mobility with CGA and only min cueing for initiation. He completed ambulatory transfer to the bathroom for shower transfer with CGA. Pt required mod cueing for selective attention  in the shower and to sequence through bathing thoroughly. Mod A for LB bathing. Pt returned to the TIS w.c and completed grooming tasks at the sink with mod cueing for initiation. Improved attention to the L. Pt donned shirt with min A, mod cueing for L attention and motor planning. Pt able to don pants, both legs at once, poor error recognition to realize L foot was caught. Despite cueing he transferred back to bed and donned pants by bridging. Pt continued to be sexually inappropriate and required a rest break with OT leaving the room. Pt required max cueing for initiation OOB. He transferred to the TIS w/c with CGA. He was taken to the BITS where he worked on sustained attention and visual scanning task. Min cueing for 1 min of sustained attention, then mod for second minute as he became fatigued. Gentle PROM provided to pt's L shoulder as he reported some tightness. Pt returned to his room and was left sitting up with all needs met, chair alarm set. Telesitter present.   Therapy Documentation Precautions:  Precautions Precautions: Fall Precaution Comments: Trach, cortrak, Lt hemiparesis Restrictions Weight Bearing Restrictions: No  Agitated Behavior Scale: TBI  ABS 17      Therapy/Group: Individual Therapy   H  02/04/2021, 6:18 AM 
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
## 2021-02-04 NOTE — Progress Notes (Signed)
Patient ID: Scott Day, male   DOB: [birth year], 60 y.o.   MRN: [id number]  SW received return phone call from [place] to discuss TBI waiver. Reports [name] does not have a TBI waiver, and hopes to have waiver soon. States they have the [employer] waiver which is for individuals who have had a TBI before age 71.   No further follow-up about this resources.   [name], MSW, [place] Office: [phone] Cell: [phone] Fax: [phone]

## 2021-02-04 NOTE — Progress Notes (Signed)
Physical Therapy TBI Note  Patient Details  Name: Scott Day MRN: [id number] Date of Birth: 04/11/1960  Today's Date: 02/04/2021 PT Individual Time: 1400-1450 PT Individual Time Calculation (min): 50 min   Short Term Goals: Week 3:  PT Short Term Goal 1 (Week 3): Patient will perform bed mobility with supervision consistently. PT Short Term Goal 2 (Week 3): Patient will perform basic transfers with CGA consistently. PT Short Term Goal 3 (Week 3): Patient will ambulate >100 feet with CGA. PT Short Term Goal 4 (Week 3): Patient will participate in 1 standardized balance assessment.  Skilled Therapeutic Interventions/Progress Updates:     Patient seated in TIS w/c at nurses station upon PT arrival. Patient alert and agreeable to PT session. Patient reported un-rated headache pain during session, RN made aware. PT provided repositioning, rest breaks, and distraction as pain interventions throughout session.   Patient without dressing on stoma site, noted grey/yellow slough over site, RN aware and discussed with MD this morning and reports stoma site to be left open to air at this time.   Patient with improved awareness this session. Able to redirect inappropriate comments x2 and patient able to self-identify inappropriate comments x2 with minimized inappropriate comments and behaviors throughout session. Patient was oriented to self, location, and situation, continues to report that it is February 2021. Reoriented patient to month, year, and day of the week.   Therapeutic Activity: Bed Mobility: Patient performed supine to/from sit with supervision on mat table to to lie down in the bed. Provided verbal cues for safety due to close proximity to the edge and initiation to return to sitting on mat table after a brief lying rest break during session due to patient fatigue. Transfers: Patient performed sit to/from stand from TIS w/c, mat table, and standard arm chair with close supervision.  Provided verbal cues for initiation.  Gait Training:  Patient ambulated >200 feet and >150 feet with R HHA x1 and no AD x1 with CGA. Ambulated with decreased gait speed, narrow BOS, decreased L visual scanning and spatial awareness, and mild decrease in L weight shift. Provided verbal cues for L attention and mod cues for path finding to his room x2.  Neuromuscular Re-ed: Patient performed the following cognitive tasks for improved attention and standing balance: -standing balance >6 min at elevated table sorting out green tiles from multicolored set with mod cues for 3 tiles on his L side, progressed to picking out blue tiles and patient picked out 2 purple tiles, able to self-correct error with min cues, then picked out 4 blue tiles before reporting fatigue with task -standing balance performing pipe tree, patient able to attend to place 3 pieces, then externally distracted and reporting fatigue  Patient in bed with soft waist belt secured at end of session with breaks locked, bed alarm set, Telesitter in place and all needs within reach. Patient missed 10 min of skilled PT due to fatigue, RN made aware. Will attempt to make-up missed time as able.    Therapy Documentation Precautions:  Precautions Precautions: Fall Precaution Comments: Trach, cortrak, Lt hemiparesis Restrictions Weight Bearing Restrictions: No General: PT Amount of Missed Time (min): 10 Minutes PT Missed Treatment Reason: Patient fatigue Agitated Behavior Scale: TBI Observation Details Observation Environment: CIR Start of observation period - Date: 02/04/21 Start of observation period - Time: 1400 End of observation period - Date: 02/04/21 End of observation period - Time: 1450 Agitated Behavior Scale (DO NOT LEAVE BLANKS) Short attention span, easy distractibility, inability  to concentrate: Present to a moderate degree Impulsive, impatient, low tolerance for pain or frustration: Absent Uncooperative, resistant  to care, demanding: Absent Violent and/or threatening violence toward people or property: Absent Explosive and/or unpredictable anger: Absent Rocking, rubbing, moaning, or other self-stimulating behavior: Absent Pulling at tubes, restraints, etc.: Absent Wandering from treatment areas: Absent Restlessness, pacing, excessive movement: Absent Repetitive behaviors, motor, and/or verbal: Present to a slight degree Rapid, loud, or excessive talking: Absent Sudden changes of mood: Absent Easily initiated or excessive crying and/or laughter: Absent Self-abusiveness, physical and/or verbal: Absent Agitated behavior scale total score: 17    Therapy/Group: Individual Therapy  Wetzel Meester L [name] PT, DPT  02/04/2021, 3:54 PM

## 2021-02-05 DIAGNOSIS — R1312 Dysphagia, oropharyngeal phase: Secondary | ICD-10-CM | POA: Diagnosis not present

## 2021-02-05 DIAGNOSIS — Z930 Tracheostomy status: Secondary | ICD-10-CM | POA: Diagnosis not present

## 2021-02-05 DIAGNOSIS — S069X2S Unspecified intracranial injury with loss of consciousness of 31 minutes to 59 minutes, sequela: Secondary | ICD-10-CM | POA: Diagnosis not present

## 2021-02-05 DIAGNOSIS — I10 Essential (primary) hypertension: Secondary | ICD-10-CM | POA: Diagnosis not present

## 2021-02-05 LAB — GLUCOSE, CAPILLARY
Glucose-Capillary: 117 mg/dL — ABNORMAL HIGH (ref 70–99)
Glucose-Capillary: 151 mg/dL — ABNORMAL HIGH (ref 70–99)
Glucose-Capillary: 97 mg/dL (ref 70–99)
Glucose-Capillary: 178 mg/dL — ABNORMAL HIGH (ref 70–99)

## 2021-02-05 NOTE — Progress Notes (Signed)
Occupational Therapy TBI Note  Patient Details  Name: Scott Day MRN: [id number] Date of Birth: 1960/03/25  Today's Date: 02/05/2021 OT Individual Time: 1205-1300 OT Individual Time Calculation (min): 55 min   Short Term Goals: Week 3:  OT Short Term Goal 1 (Week 3): Pt will complete toileting tasks with min A OT Short Term Goal 2 (Week 3): Pt will don LB clothing with no more than mod cueing OT Short Term Goal 3 (Week 3): Pt will sustain attention to ADL task for 1 min with min cueing OT Short Term Goal 4 (Week 3): Pt will don shirt with min A  Skilled Therapeutic Interventions/Progress Updates:    Pt greeted seated in TIS wc at the nurses station and agreeable to OT treatment session. Pt brought back to the room and ate lunch with OT providing supervision. OT limited distractions and pt able to attend to meal with minimal cues. Pt then reported need to go to the bathroom. Pt impulsive to try to stand before OT removed waist belt. Pt ambulated into bathroom without AD and min A. CGA for balance while standing to doff pants. Pt with successful void and BM. Pt completed toileting tasks without cues and supervision. Pt needed cues to come out and wash hands standing at the sink. Cues for sequencing, initiation, and task termination. Functional ambulation in hallway with scavenger hunt, but pt unable to attend to find objects in hallway due to distractibility of environment. Pt returned to room and transferred back into bed with supervision. Pt left semi-reclined in bed with alarm on, call bell, and needs met.   Therapy Documentation Precautions:  Precautions Precautions: Fall Precaution Comments: [name], Lt hemiparesis Restrictions Weight Bearing Restrictions: (P) No Pain:  Denies pain Agitated Behavior Scale: TBI Observation Details Observation Environment: Patient's room Start of observation period - Date: 02/05/21 Start of observation period - Time: 0835 End of  observation period - Date: 02/05/21 End of observation period - Time: 0930 Agitated Behavior Scale (DO NOT LEAVE BLANKS) Short attention span, easy distractibility, inability to concentrate: Present to a moderate degree Impulsive, impatient, low tolerance for pain or frustration: Absent Uncooperative, resistant to care, demanding: Absent Violent and/or threatening violence toward people or property: Absent Explosive and/or unpredictable anger: Absent Rocking, rubbing, moaning, or other self-stimulating behavior: Absent Pulling at tubes, restraints, etc.: Absent Wandering from treatment areas: Absent Restlessness, pacing, excessive movement: Absent Repetitive behaviors, motor, and/or verbal: Present to a slight degree Rapid, loud, or excessive talking: Absent Sudden changes of mood: Absent Easily initiated or excessive crying and/or laughter: Absent Self-abusiveness, physical and/or verbal: Absent Agitated behavior scale total score: 17  Therapy/Group: Individual Therapy  Valma Cava 02/05/2021, 12:58 PM

## 2021-02-05 NOTE — Progress Notes (Signed)
Speech Language Pathology TBI Note  Patient Details  Name: Scott Day MRN: [id number] Date of Birth: March 01, 1961  Today's Date: 02/05/2021 SLP Individual Time: 6314-9702 SLP Individual Time Calculation (min): 55 min  Short Term Goals: Week 3: SLP Short Term Goal 1 (Week 3): Patient will demonstrate 90% speech intelligibility at the word level with Min A verbal and visual cues for use of speech intelligibility strategies. SLP Short Term Goal 2 (Week 3): Patient will demonstrate sustained attention to tasks for 2-3 minutes with Mod verbal cues for redirection. SLP Short Term Goal 3 (Week 3): Patient will consume trials of NTL and Dys 2 textures with minimal overt s/sx of aspiration and min A verbal/visual cues for use of swallowing compensatory strategies. SLP Short Term Goal 4 (Week 3): Pt will use external memory notebook to increase recall of functional information to reduce agitation and caregiver burden SLP Short Term Goal 5 (Week 3): Patient will demonstrate orientation X 4 with Mod visual and verbal cues.  Skilled Therapeutic Interventions: Skilled treatment session focused on cognitive and dysphagia goals. Upon arrival, patient was asleep in bed and required more than a reasonable amount of time to initiate sitting EOB (~15 minutes). Patient then donned pants and shoes with Min verbal cues for safety.  Patient consumed trials of Dys. 2 textures with efficient mastication and complete oral clearance without overt s/s of aspiration. Recommend trial tray prior to upgrade. Patient demonstrated sustained attention to self-feeding for ~2 minutes. SLP also facilitated session by providing a basic reading task. Patient demonstrated difficulty reading, suspect due to visual acuity. Reading function improved with use of readers (2.75) but Max verbal cues were needed for problem solving throughout task. Patient constantly scratching and touching scabbing at stoma site despite education. RN aware.  Patient with decreased inappropriate verbalizations today. Patient transferred back to bed  at end of session and left with alarm on and all needs within reach. Continue with current plan of care.      Pain No/Denies Pain   Agitated Behavior Scale: TBI Observation Details Observation Environment: Patient's room Start of observation period - Date: 02/05/21 Start of observation period - Time: 0835 End of observation period - Date: 02/05/21 End of observation period - Time: 0930 Agitated Behavior Scale (DO NOT LEAVE BLANKS) Short attention span, easy distractibility, inability to concentrate: Present to a moderate degree Impulsive, impatient, low tolerance for pain or frustration: Absent Uncooperative, resistant to care, demanding: Absent Violent and/or threatening violence toward people or property: Absent Explosive and/or unpredictable anger: Absent Rocking, rubbing, moaning, or other self-stimulating behavior: Absent Pulling at tubes, restraints, etc.: Absent Wandering from treatment areas: Absent Restlessness, pacing, excessive movement: Absent Repetitive behaviors, motor, and/or verbal: Present to a slight degree Rapid, loud, or excessive talking: Absent Sudden changes of mood: Absent Easily initiated or excessive crying and/or laughter: Absent Self-abusiveness, physical and/or verbal: Absent Agitated behavior scale total score: 17  Therapy/Group: Individual Therapy  Scott Day 02/05/2021, 12:25 PM
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
## 2021-02-05 NOTE — Progress Notes (Signed)
Patient ID: Scott Day, male   DOB: [birth year], 60 y.o.   MRN: [id number]  SW called pt dtr [name] [phone]) to provide updates in which unable to get a TBI waiver for pt. SW also discussed scheduling family edu. She will speak with patient's friend and see when she is available and will follow-up.   [name], MSW, [place] Office: [phone] Cell: [phone] Fax: [phone]
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
## 2021-02-05 NOTE — Progress Notes (Addendum)
Physical Therapy TBI Note  Patient Details  Name: Scott Day MRN: [id number] Date of Birth: [birth year]  Today's Date: 02/05/2021 PT Individual Time: 6759-1638 and 1130-1150 PT Individual Time Calculation (min): 75 min and 20 min  Short Term Goals: Week 3:  PT Short Term Goal 1 (Week 3): Patient will perform bed mobility with supervision consistently. PT Short Term Goal 2 (Week 3): Patient will perform basic transfers with CGA consistently. PT Short Term Goal 3 (Week 3): Patient will ambulate >100 feet with CGA. PT Short Term Goal 4 (Week 3): Patient will participate in 1 standardized balance assessment.  Skilled Therapeutic Interventions/Progress Updates:     Patient in bed upon PT arrival. Patient alert and agreeable to PT session. Patient denied pain during session.  Focused session on initiation with functional mobility performed with supervision and set-up assist for dressing.   Patient required 16 min 33 sec to come to sitting EOB on his own. Provided cues every 30 sec to direct patient to task, patient repeated stated "I am going to right now," or "I will do anything for you, " but then would proceed to remain lying in the bed. Patient told the amount of time it had taken him to sit up x2, on the second time he finally sat up and stated "that's ridiculous."   Patient required 8 min and 22 sec to doffed his gown and shorts and don a t-shirt, long pants, and tennis shoes. Performed sit to stand x2 and sat appropriately to thread lower extremities through his pants. Provided cues attention and initiation of each task every 30 sec with reminder/incentive that if enough time remained he would be able to go outside this session.   Patient transferred to TIS w/c in front of the sink and brushed his hair. PT provided a blanket to put over his shoulders due to the lower temperature outside. Transported patient to [place] entrance and outside to sit in the sun. Patient very appreciative of time  outside, discussed management of the chickens at his home, patient able to describe egg collection, feeding, cleaning, and preparing chickens for consumption. Patient then reported that he was cold and appropriately requested to return inside. Patient transported back to the Day room with total A in TIS w/c for energy/time management.   Patient performed standing balance x2 matching colored bean bags to 6 colored dots in front of him, then performing the task while naming an food item that was that color with 3/6 correct due to inattention to task.  Patient reported that his "lungs feel bad," vital signs WFL, RN made aware.   Patient in [place] w/c with a magazine and newspaper seated at nurses station with [place], Retail banker at end of session with breaks locked, seat belt alarm set, and all needs within reach.   PT returned 30 min later and transported patient to the ADL apartment to engage in a kitchen activity. Instructed the patient that the bed was off-limits to prevent patient from laying down. Patient immediately walked to the couch and laid down despite heavy cues for returning to the kitchen. The patient smiled and stated, "I didn't lay on the bed, you didn't say anything about the couch." Instructed the patient that he had 2 min to lay down and rest before getting up to perform the activity in the kitchen. Patient agreed, however, when the timer went off he did not get up and required >5 min to initiate sitting and resisting when PT attempted to physically  assist him to sitting. Stated patient needed to return to the TIS w/c to eat lunch and patient sat up and ambulated to the w/c with supervision. Patient again left at nurses station with Terri seated in the TIS w/c with seat belt alarm set and breaks locked. NT aware patient is [place] and seated at the nurses station when lunch trays arrive.   Therapy Documentation Precautions:  Precautions Precautions: Fall Precaution Comments: [name], Lt hemiparesis Restrictions Weight Bearing Restrictions: (P) No Agitated Behavior Scale: TBI Observation Details Observation Environment: Patient's room Start of observation period - Date: 02/05/21 Start of observation period - Time: 0940 End of observation period - Date: 02/05/21 End of observation period - Time: 1055 Agitated Behavior Scale (DO NOT LEAVE BLANKS) Short attention span, easy distractibility, inability to concentrate: Present to a moderate degree Impulsive, impatient, low tolerance for pain or frustration: Absent Uncooperative, resistant to care, demanding: Absent Violent and/or threatening violence toward people or property: Absent Explosive and/or unpredictable anger: Absent Rocking, rubbing, moaning, or other self-stimulating behavior: Absent Pulling at tubes, restraints, etc.: Absent Wandering from treatment areas: Absent Restlessness, pacing, excessive movement: Absent Repetitive behaviors, motor, and/or verbal: Present to a slight degree Rapid, loud, or excessive talking: Absent Sudden changes of mood: Absent Easily initiated or excessive crying and/or laughter: Absent Self-abusiveness, physical and/or verbal: Absent Agitated behavior scale total score: 17    Therapy/Group: Individual Therapy  Keyia Moretto L Merton Wadlow PT, DPT  02/05/2021, 12:32 PM

## 2021-02-06 DIAGNOSIS — S069X2S Unspecified intracranial injury with loss of consciousness of 31 minutes to 59 minutes, sequela: Secondary | ICD-10-CM | POA: Diagnosis not present

## 2021-02-06 DIAGNOSIS — R1312 Dysphagia, oropharyngeal phase: Secondary | ICD-10-CM | POA: Diagnosis not present

## 2021-02-06 DIAGNOSIS — Z930 Tracheostomy status: Secondary | ICD-10-CM | POA: Diagnosis not present

## 2021-02-06 DIAGNOSIS — I10 Essential (primary) hypertension: Secondary | ICD-10-CM | POA: Diagnosis not present

## 2021-02-06 LAB — BASIC METABOLIC PANEL
Anion gap: 7 (ref 5–15)
BUN: 9 mg/dL (ref 6–20)
CO2: 31 mmol/L (ref 22–32)
Calcium: 9.6 mg/dL (ref 8.9–10.3)
Chloride: 102 mmol/L (ref 98–111)
Creatinine, Ser: 0.93 mg/dL (ref 0.61–1.24)
GFR, Estimated: >60 mL/min (ref >60)
Glucose, Bld: 121 mg/dL — ABNORMAL HIGH (ref 70–99)
Potassium: 3.8 mmol/L (ref 3.5–5.1)
Sodium: 140 mmol/L (ref 135–145)

## 2021-02-06 LAB — GLUCOSE, CAPILLARY
Glucose-Capillary: 134 mg/dL — ABNORMAL HIGH (ref 70–99)
Glucose-Capillary: 135 mg/dL — ABNORMAL HIGH (ref 70–99)
Glucose-Capillary: 130 mg/dL — ABNORMAL HIGH (ref 70–99)
Glucose-Capillary: 106 mg/dL — ABNORMAL HIGH (ref 70–99)

## 2021-02-06 NOTE — Progress Notes (Signed)
Physical Therapy TBI Note  Patient Details  Name: Scott Day MRN: [id number] Date of Birth: May 23, 1960  Today's Date: 02/06/2021 PT Individual Time: 0905-1002 PT Individual Time Calculation (min): 57 min   Short Term Goals: Week 3:  PT Short Term Goal 1 (Week 3): Patient will perform bed mobility with supervision consistently. PT Short Term Goal 2 (Week 3): Patient will perform basic transfers with CGA consistently. PT Short Term Goal 3 (Week 3): Patient will ambulate >100 feet with CGA. PT Short Term Goal 4 (Week 3): Patient will participate in 1 standardized balance assessment.  Skilled Therapeutic Interventions/Progress Updates:  Patient supine in bed on entrance to room. Patient alert and agreeable to PT session. Pt's daughter in room and providing vc for attn to task and motivation.   Patient with no pain complaint throughout session.  Therapeutic Activity: Bed Mobility: Patient performed supine <> sit with supervision. Pt not immediately initiating task when requested by therapist and dtr providing verbal motivation to pt to remind him to participate and take feet out of bed to sit up. VC/ tc required for attn to task. Once seated, pt is able to don shoes with verbal and visual cue although he initially dons and doffs R shoe.  Transfers: Patient performed sit<>stand transfers from various surfaces and heights throughout session with close supervision/ CGA and stand pivot transfers with CGA. Provided verbal cues for technique and reaching back to seat for appropriate positioning.  Gait Training:  Patient ambulated 212 ft using close CGA using no AD with close w/c follow. Initially pt demos mild difficulty with balance but is able find balance and maintain throughout turns and holding straight path on return to room. Provided vc/ tc for focus on direction toward room - pt is able to correctly determine path back to room.  Neuromuscular Re-ed: NMR facilitated during session with  focus on dual tasking with standing balance. Pt guided in color finding by placing weighted black discs on called color discs and providing examples of objects/ foods of same color. Of six colors, pt is able to provide examples for four. Pt also guided in dynamic stepping task with eyes closed and arm outstretched to point at alphabet. Once letter determined, pt asked for city that starts with letter chosen. "T" is letter chosen and pt provides answer of [place]. When related that [place] is a state, not a city, pt agrees but does not provide a new solution. With attempt to continue with choosing addditional letters, pt stays with letter "T" and is unable to change answers from [place]. With request for food item, pt provides "[place]".   Balance requires CGA to maintain throughout. NMR performed for improvements in motor control and coordination, balance, sequencing, judgement, and self confidence/ efficacy in performing all aspects of mobility at highest level of independence.   Patient supine in bed at end of session with brakes locked, bed alarm set, and all needs within reach. Dtr present in room.     Therapy Documentation Precautions:  Precautions Precautions: Fall Precaution Comments: Trach, cortrak, Lt hemiparesis Restrictions Weight Bearing Restrictions: No General:   Pain: Pain Assessment Pain Scale: 0-10 Pain Score: Asleep Agitated Behavior Scale: TBI Observation Details Observation Environment: CIR Start of observation period - Date: 02/06/21 Start of observation period - Time: 0900 End of observation period - Date: 02/06/21 End of observation period - Time: 1000 Agitated Behavior Scale (DO NOT LEAVE BLANKS) Short attention span, easy distractibility, inability to concentrate: Present to a moderate degree  Impulsive, impatient, low tolerance for pain or frustration: Absent Uncooperative, resistant to care, demanding: Absent Violent and/or threatening violence toward people or  property: Absent Explosive and/or unpredictable anger: Absent Rocking, rubbing, moaning, or other self-stimulating behavior: Absent Pulling at tubes, restraints, etc.: Absent Wandering from treatment areas: Absent Restlessness, pacing, excessive movement: Absent Repetitive behaviors, motor, and/or verbal: Present to a slight degree Rapid, loud, or excessive talking: Absent Sudden changes of mood: Absent Easily initiated or excessive crying and/or laughter: Absent Self-abusiveness, physical and/or verbal: Absent Agitated behavior scale total score: 17  Therapy/Group: Individual Therapy  Alger Simons PT, DPT 02/06/2021, 7:46 AM

## 2021-02-06 NOTE — Progress Notes (Signed)
Patient got out of bed and urinated in the floor, bed alarm and tele sitter alarmed, nurses and techs responded and got patient back to bed.
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
## 2021-02-06 NOTE — Progress Notes (Signed)
Speech Language Pathology TBI Note  Patient Details  Name: Scott Day MRN: [id number] Date of Birth: [birth year]  Today's Date: 02/06/2021 SLP Individual Time: 0700-0755 SLP Individual Time Calculation (min): 55 min  Short Term Goals: Week 3: SLP Short Term Goal 1 (Week 3): Patient will demonstrate 90% speech intelligibility at the word level with Min A verbal and visual cues for use of speech intelligibility strategies. SLP Short Term Goal 2 (Week 3): Patient will demonstrate sustained attention to tasks for 2-3 minutes with Mod verbal cues for redirection. SLP Short Term Goal 3 (Week 3): Patient will consume trials of NTL and Dys 2 textures with minimal overt s/sx of aspiration and min A verbal/visual cues for use of swallowing compensatory strategies. SLP Short Term Goal 4 (Week 3): Pt will use external memory notebook to increase recall of functional information to reduce agitation and caregiver burden SLP Short Term Goal 5 (Week 3): Patient will demonstrate orientation X 4 with Mod visual and verbal cues.  Skilled Therapeutic Interventions: Skilled treatment session focused on dysphagia and cognitive goals. Upon arrival, patient was asleep in bed but easily awakened. SLP facilitated session by providing more than a reasonable amount of time for initiation of sitting EOB, eventually requiring Mod verbal and tactile cues. Patient requested to use the bathroom and ambulated to the commode. Patient was continent of bowel and bladder and required extra time and Max verbal cues to initiate peri care. Patient also required Mod verbal and visual cues for thoroughness and problem solving while washing his hands. Patient consumed a trial tray of Dys. 2 textures with nectar-thick liquids. Patient with mildly prolonged mastication with mild oral residue that he cleared with a liquid wash. No overt s/s of aspiration noted. Patient continues to require Max verbal cues for initiation and attention with  self-feeding as patient remains distracted by verbosity. Recommend patient upgrade to Dys. 2 textures and continue full supervision. Patient with intermittent inappropriate language that was easily redirected. Patient handed off to NT. Continue with current plan of care.      Pain No/Denies Pain   Agitated Behavior Scale: TBI Observation Details Observation Environment: patient's room Start of observation period - Date: 02/06/21 Start of observation period - Time: 0700 End of observation period - Date: 02/06/21 End of observation period - Time: 0755 Agitated Behavior Scale (DO NOT LEAVE BLANKS) Short attention span, easy distractibility, inability to concentrate: Present to a moderate degree Impulsive, impatient, low tolerance for pain or frustration: Absent Uncooperative, resistant to care, demanding: Absent Violent and/or threatening violence toward people or property: Absent Explosive and/or unpredictable anger: Absent Rocking, rubbing, moaning, or other self-stimulating behavior: Absent Pulling at tubes, restraints, etc.: Absent Wandering from treatment areas: Absent Restlessness, pacing, excessive movement: Absent Repetitive behaviors, motor, and/or verbal: Present to a slight degree Rapid, loud, or excessive talking: Absent Sudden changes of mood: Absent Easily initiated or excessive crying and/or laughter: Absent Self-abusiveness, physical and/or verbal: Absent Agitated behavior scale total score: 17  Therapy/Group: Individual Therapy  Scott Day 02/06/2021, 9:40 AM

## 2021-02-06 NOTE — Progress Notes (Signed)
Nurse entered room for med pass and patient stated "you were supposed to come play with me" this nurse turned from computer to see patient handling penis and he stated "do you want this inside you?" Patient then asked if it was an inappropriate thing to say. Nurse made patient aware that the comment and actions inappropriate and called for another staff member to the room.
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
## 2021-02-07 LAB — GLUCOSE, CAPILLARY
Glucose-Capillary: 124 mg/dL — ABNORMAL HIGH (ref 70–99)
Glucose-Capillary: 127 mg/dL — ABNORMAL HIGH (ref 70–99)
Glucose-Capillary: 92 mg/dL (ref 70–99)
Glucose-Capillary: 147 mg/dL — ABNORMAL HIGH (ref 70–99)

## 2021-02-07 NOTE — Progress Notes (Signed)
Physical Therapy TBI Note  Patient Details  Name: Scott Day MRN: [id number] Date of Birth: 10-07-1960  Today's Date: 02/07/2021 PT Individual Time: 0900-1000 PT Individual Time Calculation (min): 60 min   Short Term Goals: Week 3:  PT Short Term Goal 1 (Week 3): Patient will perform bed mobility with supervision consistently. PT Short Term Goal 2 (Week 3): Patient will perform basic transfers with CGA consistently. PT Short Term Goal 3 (Week 3): Patient will ambulate >100 feet with CGA. PT Short Term Goal 4 (Week 3): Patient will participate in 1 standardized balance assessment.  Skilled Therapeutic Interventions/Progress Updates:     Patient in bed upon PT arrival. Patient alert and agreeable to PT session. Patient denied pain during session.  Patient slow to initiate mobility and demonstrating signs of fatigue throughout session. Continues with inappropriate comments this session with improved corrective behaviors. Responded well with PT setting a rule during therapy to not say inappropriate things out loud during session, no decrease in behaviors, but improve awareness of when inappropriate comments were made and understanding that he should not have said them out loud.   Patient had not eaten breakfast upon PT arrival. Focused session on dual task gait training, path finding, stair training, L attention, and orientation throughout session. Patient carried his lunch tray from his room to a table in the Day Room, a moderately busy environment. Set-up breakfast with assist only for locating silverware due to packaging. He ate his breakfast in 15 min with 2 cues to attend to his L side of his plate, he ate 100% of his eggs and sausage, >25% on his oatmeal, stated it was not good, and ate 50% of his applesauce and drank 50% of his juice. PT remained on his L side and engaged patient in conversation on present deficits. Educated patient on L inattention, decreased inhibition, increased  confusion, decreased recall and STM, decreased balance and gait impairments. Patient engaged in conversation appropriately with intermittent external distraction, without slowing his eating pace or signs of aspiration with Dysphasia II diet and honey thick liquids.   Therapeutic Activity: Bed Mobility: Patient performed supine to sit with min A to facilitate initiation and sit to supine with supervision-mod I. Provided verbal cues for initiation and scooting up in bed for improved positioning. Patient donned/doffed shoes EOB with set-up assist.  Transfers: Patient performed sit to/from stand x3 with supervision without an AD. Provided verbal cues for initiation.  Gait Training:  Patient ambulated >55 feet x2 holding his full breakfast tray for dual task training with CGA and min A for holding try to support his L hand and mod A x1 due to LOB with distraction during dual task. He ambulated >150 feet without an AD with CGA while performing path finding task with max A to find the main Therapy Gym and mod A to return to his room. Ambulated with slow gait speed, decreased visual scanning and attention to the L, increased external distraction to people on the R, and mild scissoring of gait with distractions. Provided mod multimodal cues for L attention, increased gait speed, and attention to task. Patient ascended/descended 12x6" steps using B rails with CGA for safety. Performed reciprocal gait pattern throughout. Provided mod progressing to min cues for technique and sequencing.   Patient in bed set to chair position to improve arousal and drainage of secretions at end of session with breaks locked, bed alarm set, and all needs within reach.   Therapy Documentation Precautions:  Precautions Precautions: Fall  Precaution Comments: Trach, cortrak, Lt hemiparesis Restrictions Weight Bearing Restrictions: No Agitated Behavior Scale: TBI Observation Details Observation Environment: patient's room Start  of observation period - Date: 02/07/21 Start of observation period - Time: 0900 End of observation period - Date: 02/07/21 End of observation period - Time: 1000 Agitated Behavior Scale (DO NOT LEAVE BLANKS) Short attention span, easy distractibility, inability to concentrate: Present to a moderate degree Impulsive, impatient, low tolerance for pain or frustration: Absent Uncooperative, resistant to care, demanding: Absent Violent and/or threatening violence toward people or property: Absent Explosive and/or unpredictable anger: Absent Rocking, rubbing, moaning, or other self-stimulating behavior: Absent Pulling at tubes, restraints, etc.: Absent Wandering from treatment areas: Absent Restlessness, pacing, excessive movement: Absent Repetitive behaviors, motor, and/or verbal: Present to a slight degree Rapid, loud, or excessive talking: Absent Sudden changes of mood: Absent Easily initiated or excessive crying and/or laughter: Absent Self-abusiveness, physical and/or verbal: Absent Agitated behavior scale total score: 17    Therapy/Group: Individual Therapy  Scott Day L Nereida Schepp PT, DPT  02/07/2021, 12:57 PM

## 2021-02-08 DIAGNOSIS — I10 Essential (primary) hypertension: Secondary | ICD-10-CM

## 2021-02-08 DIAGNOSIS — D62 Acute posthemorrhagic anemia: Secondary | ICD-10-CM

## 2021-02-08 DIAGNOSIS — S069X2D Unspecified intracranial injury with loss of consciousness of 31 minutes to 59 minutes, subsequent encounter: Secondary | ICD-10-CM | POA: Diagnosis not present

## 2021-02-08 DIAGNOSIS — R7309 Other abnormal glucose: Secondary | ICD-10-CM | POA: Diagnosis not present

## 2021-02-08 DIAGNOSIS — R1310 Dysphagia, unspecified: Secondary | ICD-10-CM

## 2021-02-08 LAB — GLUCOSE, CAPILLARY
Glucose-Capillary: 98 mg/dL (ref 70–99)
Glucose-Capillary: 158 mg/dL — ABNORMAL HIGH (ref 70–99)
Glucose-Capillary: 91 mg/dL (ref 70–99)
Glucose-Capillary: 147 mg/dL — ABNORMAL HIGH (ref 70–99)

## 2021-02-08 MED ORDER — ORAL CARE MOUTH RINSE
15.0000 mL | Freq: Two times a day (BID) | OROMUCOSAL | Status: DC
  Administered 2021-02-08 – 2021-02-09 (×4): 15 mL via OROMUCOSAL
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
## 2021-02-08 NOTE — Progress Notes (Signed)
Occupational Therapy TBI Note  Patient Details  Name: Scott Day MRN: [id number] Date of Birth: June 19, 1960  Today's Date: 02/08/2021 OT Individual Time: 0905-1000 OT Individual Time Calculation (min): 55 min    Short Term Goals: Week 3:  OT Short Term Goal 1 (Week 3): Pt will complete toileting tasks with min A OT Short Term Goal 2 (Week 3): Pt will don LB clothing with no more than mod cueing OT Short Term Goal 3 (Week 3): Pt will sustain attention to ADL task for 1 min with min cueing OT Short Term Goal 4 (Week 3): Pt will don shirt with min A  Skilled Therapeutic Interventions/Progress Updates:    Pt greeted sitting in TIS wc with nurse tech. Per nurse tech, pt incontinent of BM while ambulating to the bathroom with her, so she assisted him with a shower afterwards. Per NT, pt was able to assist with BADL tasks, but was very easily distracted. Pt brought to therapy gym and worked on visual scanning, L attention, functional use of L UE, and memory with BITS activity. Pt needed moderate cues to locate images on L side of screen, but was able to read and recall up to three words with images and minimal cues. Pt became internally distracted after 3 rounds of activity and unable to recall or read out the 2-3 words. Standing balance and L attention with horse shoe toss activity. OT had pt retrieve horse shoes from table on L side with L hand with min cues, then step when tossing horse shoe. Pt able to attend to toss 4 horse shoes at a time before becoming distracted and sitting down. Pt complete 4 rounds of this task with CGA for balance overall. Pt with multiple inappropriate comments towards therapist requiring cues on what is and is not appropriate to say. Attempted to play graded card matching game with only 5 matches possible (10 cards in front of pt). Pt perseverated on flipping cards over and unable to attend to task. OT graded activity down to limit only 2 possible matching pairs, but he  continued to be too distracted trying to add numbers together instead of locating the pair. Pt returned to room and ambulated 10 feet back to bed with CGA. Pt followed commands to doff shoes  and returned to bed. Pt left semi-reclined in bed with bed alarm on, call bell in reach, country music on and needs met.   Therapy Documentation Precautions:  Precautions Precautions: Fall Precaution Comments: [name], Lt hemiparesis Restrictions Weight Bearing Restrictions: No  Pain:  Denies pain Agitated Behavior Scale: TBI Observation Details Observation Environment: CIR Start of observation period - Date: 02/08/21 Start of observation period - Time: 0900 End of observation period - Date: 02/08/21 End of observation period - Time: 1000 Agitated Behavior Scale (DO NOT LEAVE BLANKS) Short attention span, easy distractibility, inability to concentrate: Present to a moderate degree Impulsive, impatient, low tolerance for pain or frustration: Absent Uncooperative, resistant to care, demanding: Absent Violent and/or threatening violence toward people or property: Absent Explosive and/or unpredictable anger: Absent Rocking, rubbing, moaning, or other self-stimulating behavior: Absent Pulling at tubes, restraints, etc.: Absent Wandering from treatment areas: Absent Restlessness, pacing, excessive movement: Absent Repetitive behaviors, motor, and/or verbal: Present to a slight degree Rapid, loud, or excessive talking: Present to a slight degree Sudden changes of mood: Absent Easily initiated or excessive crying and/or laughter: Absent Self-abusiveness, physical and/or verbal: Absent Agitated behavior scale total score: 18     Therapy/Group: Individual  Therapy  Daneen Schick Zeev Deakins 02/08/2021, 10:08 AM

## 2021-02-08 NOTE — Progress Notes (Signed)
Physical Therapy TBI Note  Patient Details  Name: Scott Day MRN: [id number] Date of Birth: 08-18-1960  Today's Date: 02/08/2021 PT Individual Time: 1300-1430 PT Individual Time Calculation (min): 90 min   Short Term Goals: Week 3:  PT Short Term Goal 1 (Week 3): Patient will perform bed mobility with supervision consistently. PT Short Term Goal 2 (Week 3): Patient will perform basic transfers with CGA consistently. PT Short Term Goal 3 (Week 3): Patient will ambulate >100 feet with CGA. PT Short Term Goal 4 (Week 3): Patient will participate in 1 standardized balance assessment.  Skilled Therapeutic Interventions/Progress Updates:     Patient in bed with RN providing medications upon PT arrival. Patient alert and agreeable to PT session. Patient denied pain during session.  Patient demonstrated improved initiation and self regulation this session, however, increased attention deficits with internal and external distractions. Patient with decreased inappropriate behaviors, able to regulate with min cues this session and no inappropriate behaviors >20 min in main therapy gym with another therapist and patient present. Patient even interacted with the other patient appropriately providing encouragement and praise for her performance during therapy.   Therapeutic Activity: Bed Mobility: Patient performed supine to/from sit with min A for initiation then supervision after laying back down spontaneously. Required max cues to sit back up with therapist stating session would end if he continued to lay down, with good patient response.  Transfers: Patient performed sit to/from stand with supervision throughout session from bed, standard arm chair, mat table, and NuStep seat. Provided verbal cues for waiting for therapist before standing x3, patient compliant with instructions without signs of impulsivity, even with therapist across the room in line of sight.  Gait Training:  Patient ambulated  >150 feet x3 using without AD with CGA and intermittent min A to redirect patient due to increased external distractibility. Patient demonstrated poor recall of path finding to the gym and his room, very limited by external distraction this session.   Neuromuscular Re-ed: Patient performed the following postural and motor control activities with dual task, attention, or recall challenge for cognitive retraining: -side stepping 10 feet R/L x3, progressed to crossed stepping with side step 10 feet R/L x4 with patient initially demonstrating increased challenge with cross step to the L, however, progressed to performing step without cues with repetition -8 min 14 sec searching for and retrieving 10 orange cones throughout the main therapy gym with CGA bending down to pick up dropped cones x2 and walking up 8x3" steps taking 2 steps at a time and down 4x6" steps with 1 rail; required min cues for recall of task, progressed from min cues to max cues for attention to task with increased time, patient able to locate cones on L and R sides of the room, patient located the last cone, but sat down due to fatigue, rather then completing the task -5 min on the NuStep at level 5 with cues to maintain >40 SPM for 5 min, patient required >15 min to complete task due to external and internal distraction with max cues for attention to task, patient reported that he enjoyed the NuStep and noted that he did not attend well during the activity  Patient in bed at end of session with breaks locked, bed alarm set, Telesitter in place, and all needs within reach. Patient requested to turn on the TV, PT educated on mental fatigue from 90 session and risk for overstimulation with TV at this time. Instructed patient to wait 1 hour  after session before asking to have the TV turned on. NT made aware and asked to limit TV time to 1-2 hours this evening, NT in agreement.   Therapy Documentation Precautions:  Precautions Precautions:  Fall Precaution Comments: Scott Day, Lt hemiparesis Restrictions Weight Bearing Restrictions: No Agitated Behavior Scale: TBI Observation Details Observation Environment: [facility] Start of observation period - Date: 02/08/21 Start of observation period - Time: 0300 End of observation period - Date: 02/08/21 End of observation period - Time: 1430 Agitated Behavior Scale (DO NOT LEAVE BLANKS) Short attention span, easy distractibility, inability to concentrate: Present to a moderate degree Impulsive, impatient, low tolerance for pain or frustration: Absent Uncooperative, resistant to care, demanding: Absent Violent and/or threatening violence toward people or property: Absent Explosive and/or unpredictable anger: Absent Rocking, rubbing, moaning, or other self-stimulating behavior: Absent Pulling at tubes, restraints, etc.: Absent Wandering from treatment areas: Absent Restlessness, pacing, excessive movement: Absent Repetitive behaviors, motor, and/or verbal: Present to a slight degree Rapid, loud, or excessive talking: Present to a slight degree Sudden changes of mood: Absent Easily initiated or excessive crying and/or laughter: Absent Self-abusiveness, physical and/or verbal: Absent Agitated behavior scale total score: 18    Therapy/Group: Individual Therapy  Alaisa Moffitt L Vanshika Jastrzebski PT, DPT  02/08/2021, 3:24 PM

## 2021-02-08 NOTE — Progress Notes (Signed)
Speech Language Pathology TBI Note  Patient Details  Name: Scott Day MRN: [id number] Date of Birth: May 12, 1960  Today's Date: 02/08/2021 SLP Individual Time: 1100-1155 SLP Individual Time Calculation (min): 55 min  Short Term Goals: Week 3: SLP Short Term Goal 1 (Week 3): Patient will demonstrate 90% speech intelligibility at the word level with Min A verbal and visual cues for use of speech intelligibility strategies. SLP Short Term Goal 2 (Week 3): Patient will demonstrate sustained attention to tasks for 2-3 minutes with Mod verbal cues for redirection. SLP Short Term Goal 3 (Week 3): Patient will consume trials of NTL and Dys 2 textures with minimal overt s/sx of aspiration and min A verbal/visual cues for use of swallowing compensatory strategies. SLP Short Term Goal 4 (Week 3): Pt will use external memory notebook to increase recall of functional information to reduce agitation and caregiver burden SLP Short Term Goal 5 (Week 3): Patient will demonstrate orientation X 4 with Mod visual and verbal cues.  Skilled Therapeutic Interventions:  Pt was seen for skilled ST targeting goals for cognition and dysphagia.  Pt was asleep upon arrival but awakened easily to voice and was agreeable to participating in treatment.  Pt only made x2 mildly suggestive comments throughout today's session and was easily redirected to more appropriate topics of conversation.  Pt was encouraged to sit at edge of bed for today's session to maximize attention to task and safety  with trials of advanced consistencies.  When transferring to sitting position, pt needed more than a reasonable amount of time as well as mod verbal and tactile cues for initiation of movement.  SLP facilitated the session with trials of nectar thick liquids to continue working towards diet progression.  Pt consumed advanced consistencies with min cues for use of swallowing strategies and no overt s/s of aspiration.  SLP also facilitated  the session with a basic peg board task to address goals for attention and problem solving.  Pt sustained his attention to task for ~1-2 minute intervals before requiring mod cues for redirection.  He required max cues for problem solving to recreate patterns from pictures due to decreased error awareness.  Pt also benefited from mod faded to min verbal cues to locate items needed for tasks from the left of midline.  When reviewing his memory notebook, pt was able to reorient to today's date with min verbal cues.  Pt was left in bed with bed alarm set and call bell within reach.  Continue per current plan of care.    Pain Pain Assessment Pain Scale: 0-10 Pain Score: 0-No pain  Agitated Behavior Scale: TBI Observation Details Observation Environment: pt's room Start of observation period - Date: 02/08/21 Start of observation period - Time: 1100 End of observation period - Date: 02/08/21 End of observation period - Time: 1200 Agitated Behavior Scale (DO NOT LEAVE BLANKS) Short attention span, easy distractibility, inability to concentrate: Present to a moderate degree Impulsive, impatient, low tolerance for pain or frustration: Absent Uncooperative, resistant to care, demanding: Absent Violent and/or threatening violence toward people or property: Absent Explosive and/or unpredictable anger: Absent Rocking, rubbing, moaning, or other self-stimulating behavior: Absent Pulling at tubes, restraints, etc.: Absent Wandering from treatment areas: Absent Restlessness, pacing, excessive movement: Absent Repetitive behaviors, motor, and/or verbal: Present to a slight degree Rapid, loud, or excessive talking: Absent Sudden changes of mood: Absent Easily initiated or excessive crying and/or laughter: Absent Self-abusiveness, physical and/or verbal: Absent Agitated behavior scale total score: 17  Therapy/Group: Individual Therapy  Scott Day, Selinda Orion 02/08/2021, 11:52 AM

## 2021-02-09 LAB — BASIC METABOLIC PANEL
Anion gap: 7 (ref 5–15)
BUN: 9 mg/dL (ref 6–20)
CO2: 29 mmol/L (ref 22–32)
Calcium: 9.1 mg/dL (ref 8.9–10.3)
Chloride: 102 mmol/L (ref 98–111)
Creatinine, Ser: 0.8 mg/dL (ref 0.61–1.24)
GFR, Estimated: >60 mL/min (ref >60)
Glucose, Bld: 124 mg/dL — ABNORMAL HIGH (ref 70–99)
Potassium: 4 mmol/L (ref 3.5–5.1)
Sodium: 138 mmol/L (ref 135–145)

## 2021-02-09 LAB — GLUCOSE, CAPILLARY
Glucose-Capillary: 119 mg/dL — ABNORMAL HIGH (ref 70–99)
Glucose-Capillary: 136 mg/dL — ABNORMAL HIGH (ref 70–99)
Glucose-Capillary: 118 mg/dL — ABNORMAL HIGH (ref 70–99)
Glucose-Capillary: 141 mg/dL — ABNORMAL HIGH (ref 70–99)

## 2021-02-09 NOTE — Progress Notes (Addendum)
Physical Therapy TBI Note  Patient Details  Name: Scott Day MRN: [id number] Date of Birth: [birth year]  Today's Date: 02/09/2021 PT Individual Time: 3299-2426 and 1445-1530 PT Individual Time Calculation (min): 45 min and 45 min  Short Term Goals: Week 4:  PT Short Term Goal 1 (Week 4): STG=LTG due to ELOS.  Skilled Therapeutic Interventions/Progress Updates:     Session 1: Patient in bed with his daughter at bedside upon PT arrival. Patient alert and agreeable to PT session. Patient denied pain during session.  Patient requested a toothpick at beginning of session. Utilized locating a tooth pick as motivation for patient to get OOB. Patient agreeable, but did not initiate sitting up. PT informed patient that PT would leave to see another patient if he did not get OOB, patient then sat up EOB with supervision. Patient provided pants and donned pants and shoes with set-up assist and significant time and cues due to internal and external distractibility. Patient required PT to sit next to him to block him from returning to lying. Patient required max cues and PT walking to the door to initiate standing then ambulating to the Day room to retrieve a tooth flosser "tooth pick." Patient then ambulated back to his room due to time constraints limited by patient requiring increased time and with delayed initiation to complete tasks. Patient performed sit to stand x2 with supervision and ambulated with CGA-close supervision and max cues for attention to task and path finding.   Patient in [place] w/c with his daughter in the room at end of session with breaks locked, seat belt alarm set, and all needs within reach.   Session 2: Patient in TIS w/c upon PT arrival. Patient alert and agreeable to PT session. Patient denied pain during session.  Focused session on outcome measure's for weekly assessment. Patient demonstrated improved attention this session completing 3/3 planned assessments, and required  only 4 cues to inhibit or refrain from using inappropriate comments.   Therapeutic Activity: Bed Mobility: Patient performed sit to supine with supervision. Provided verbal cues for positioning. Transfers: Patient performed sit to/from stand x6 with supervision. Provided verbal cues for safety awareness and visual scanning for the correct chair x2.  Gait Training:  Patient ambulated >75 feet x2 as above, continues to need max cues for attention to task and locating his room.  6 Min Walk Test:  Instructed patient to ambulate as quickly and as safely as possible for 6 minutes using LRAD. Patient was allowed to take standing rest breaks without stopping the test, but if the patient required a sitting rest break the clock would be stopped and the test would be over.  Results: 690 feet (210 meters, Avg speed 0.6 m/s) completing the full 6 min using CGA and intermittent close supervision, RPE 2-3/10 at end of test vitals WFL. Results indicate that the patient has reduced endurance with ambulation compared to age matched norms (81-69 y.o males = 34 m). Distance reduced from 816 ft and 35m/s gait speed last week, consistent with increased time to complete tasks and decreased attention to tasks seen the past few days.   Neuromuscular Re-ed: Patient performed the following Berg Balance Test and 5 Time Sit to Stand: Patient demonstrates increased fall risk as noted by score of 40/56 on Berg Balance Scale.  (<36= high risk for falls, close to 100%; 37-45 significant >80%; 46-51 moderate >50%; 52-55 lower >25%) This is the first time the patient has been able to attend to all of the  items on the test, some scores limited by decreased attention or ability to follow cues/demonstration for the items (eyes closed, tandem stance/staggered stance, reaching forward, standing with feet together) Five times Sit to Stand Test (FTSTS) Method: Use a straight back chair with a solid seat that is 16-18" high. Ask participant  to sit on the chair with arms folded across their chest.   Instructions: "Stand up and sit down as quickly as possible 5 times, keeping your arms folded across your chest."   Measurement: Stop timing when the participant stands the 5th time.  TIME: __20____ (in seconds)  Times > 13.6 seconds is associated with increased disability and morbidity (Guralnik, 2000) Times > 15 seconds is predictive of recurrent falls in healthy individuals aged 37 and older (Buatois, et al., 2008) Normal performance values in community dwelling individuals aged 6 and older (Bohannon, 2006): 60-69 years: 11.4 seconds 70-79 years: 12.6 seconds 80-89 years: 14.8 seconds  MCID: ? 2.3 seconds for Vestibular Disorders Mariah Milling, 2006)  Reviewed results and interpretation of all assessments and previous scores and recorded results in patient's memory notebook at end of session.   Patient in bed at end of session with breaks locked, bed alarm set, Telesitter in the room, and all needs within reach.   Therapy Documentation Precautions:  Precautions Precautions: Fall Precaution Comments: [name], Lt hemiparesis Restrictions Weight Bearing Restrictions: No Agitated Behavior Scale: TBI Observation Details Observation Environment: [place] Start of observation period - Date: 02/09/21 Start of observation period - Time: 1030 End of observation period - Date: 02/09/21 End of observation period - Time: 1115 Agitated Behavior Scale (DO NOT LEAVE BLANKS) Short attention span, easy distractibility, inability to concentrate: Present to a moderate degree Impulsive, impatient, low tolerance for pain or frustration: Absent Uncooperative, resistant to care, demanding: Absent Violent and/or threatening violence toward people or property: Absent Explosive and/or unpredictable anger: Absent Rocking, rubbing, moaning, or other self-stimulating behavior: Absent Pulling at tubes, restraints, etc.: Absent Wandering from  treatment areas: Absent Restlessness, pacing, excessive movement: Absent Repetitive behaviors, motor, and/or verbal: Present to a slight degree Rapid, loud, or excessive talking: Absent Sudden changes of mood: Absent Easily initiated or excessive crying and/or laughter: Absent Self-abusiveness, physical and/or verbal: Absent Agitated behavior scale total score: 17 Agitated Behavior Scale: TBI Observation Details Observation Environment: [place] Start of observation period - Date: 02/09/21 Start of observation period - Time: 1445 End of observation period - Date: 02/09/21 End of observation period - Time: 1530 Agitated Behavior Scale (DO NOT LEAVE BLANKS) Short attention span, easy distractibility, inability to concentrate: Present to a slight degree Impulsive, impatient, low tolerance for pain or frustration: Absent Uncooperative, resistant to care, demanding: Absent Violent and/or threatening violence toward people or property: Absent Explosive and/or unpredictable anger: Absent Rocking, rubbing, moaning, or other self-stimulating behavior: Absent Pulling at tubes, restraints, etc.: Absent Wandering from treatment areas: Absent Restlessness, pacing, excessive movement: Absent Repetitive behaviors, motor, and/or verbal: Absent Rapid, loud, or excessive talking: Absent Sudden changes of mood: Absent Easily initiated or excessive crying and/or laughter: Absent Self-abusiveness, physical and/or verbal: Absent Agitated behavior scale total score: 15  Balance: Standardized Balance Assessment Standardized Balance Assessment: Berg Balance Test Berg Balance Test Sit to Stand: Able to stand without using hands and stabilize independently Standing Unsupported: Able to stand safely 2 minutes Sitting with Back Unsupported but Feet Supported on Floor or Stool: Able to sit safely and securely 2 minutes Stand to Sit: Sits safely with minimal use of hands Transfers:  Able to transfer safely,  minor use of hands Standing Unsupported with Eyes Closed: Unable to keep eyes closed 3 seconds but stays steady Standing Ubsupported with Feet Together: Able to place feet together independently but unable to hold for 30 seconds From Standing, Reach Forward with Outstretched Arm: Can reach forward >12 cm safely (5") From Standing Position, Pick up Object from Floor: Able to pick up shoe safely and easily From Standing Position, Turn to Look Behind Over each Shoulder: Looks behind one side only/other side shows less weight shift (limited on L) Turn 360 Degrees: Needs close supervision or verbal cueing Standing Unsupported, Alternately Place Feet on Step/Stool: Able to stand independently and complete 8 steps >20 seconds Standing Unsupported, One Foot in Front: Able to take small step independently and hold 30 seconds Standing on One Leg: Tries to lift leg/unable to hold 3 seconds but remains standing independently Total Score: 40/56     Therapy/Group: Individual Therapy  Tykwon Fera L Othon Guardia PT, DPT  02/09/2021, 4:03 PM

## 2021-02-09 NOTE — Progress Notes (Signed)
Physical Therapy Weekly Progress Note  Patient Details  Name: Scott Day MRN: [id number] Date of Birth: 06-16-1960  Beginning of progress report period: February 02, 2021 End of progress report period: February 09, 2021  Today's Date: 02/09/2021  Patient has met 4 of 4 short term goals.  Patient with steady progress this week demonstrating improved balance with functional mobility and increased awareness of deficits and orientation. Patient continues to be limited by decreased initiation and attention with intermittent inappropriate behaviors (able to be redirected). He performs bed mobility with supervision with max cues for initiation, transfers with supervision and mod-min cues for initiation, gait with CGA and intermittent supervision 690 feet on 6 Min Walk Test today, and CGA on 12 steps using B rails. Also performed today: Berg Balance Scale 40/56, unable to attend long enough to perform until today, and 5 Time Sit to Stand: 20 sec, improved from 44 sec last week.   Patient continues to demonstrate the following deficits decreased cardiorespiratoy endurance, decreased visual perceptual skills, decreased attention to left, decreased initiation, decreased attention, decreased awareness, decreased problem solving, decreased safety awareness, decreased memory, and delayed processing, and decreased sitting balance, decreased standing balance, decreased postural control, and decreased balance strategies and therefore will continue to benefit from skilled PT intervention to increase functional independence with mobility.   Patient progressing toward long term goals..  Continue plan of care.  PT Short Term Goals Week 3:  PT Short Term Goal 1 (Week 3): Patient will perform bed mobility with supervision consistently. PT Short Term Goal 1 - Progress (Week 3): Met PT Short Term Goal 2 (Week 3): Patient will perform basic transfers with CGA consistently. PT Short Term Goal 2 - Progress (Week 3):  Met PT Short Term Goal 3 (Week 3): Patient will ambulate >100 feet with CGA. PT Short Term Goal 3 - Progress (Week 3): Met PT Short Term Goal 4 (Week 3): Patient will participate in 1 standardized balance assessment. PT Short Term Goal 4 - Progress (Week 3): Met Week 4:  PT Short Term Goal 1 (Week 4): STG=LTG due to ELOS.   Therapy Documentation Precautions:  Precautions Precautions: Fall Precaution Comments: [name], Lt hemiparesis Restrictions Weight Bearing Restrictions: No   Therapy/Group: Individual Therapy  Pierrette Scheu L Kaimani Clayson PT, DPT  02/09/2021, 4:11 PM

## 2021-02-09 NOTE — Progress Notes (Signed)
Carelink Summary Report / Loop Recorder 

## 2021-02-09 NOTE — Progress Notes (Signed)
Speech Language Pathology TBI Note  Patient Details  Name: Scott Day MRN: [id number] Date of Birth: 07-Mar-1961  Today's Date: 02/09/2021 SLP Individual Time: 5027-7412 SLP Individual Time Calculation (min): 30 min  Short Term Goals: Week 3: SLP Short Term Goal 1 (Week 3): Patient will demonstrate 90% speech intelligibility at the word level with Min A verbal and visual cues for use of speech intelligibility strategies. SLP Short Term Goal 2 (Week 3): Patient will demonstrate sustained attention to tasks for 2-3 minutes with Mod verbal cues for redirection. SLP Short Term Goal 3 (Week 3): Patient will consume trials of NTL and Dys 2 textures with minimal overt s/sx of aspiration and min A verbal/visual cues for use of swallowing compensatory strategies. SLP Short Term Goal 4 (Week 3): Pt will use external memory notebook to increase recall of functional information to reduce agitation and caregiver burden SLP Short Term Goal 5 (Week 3): Patient will demonstrate orientation X 4 with Mod visual and verbal cues.  Skilled Therapeutic Interventions: Skilled treatment session focused on cognitive goals. Patient was independently oriented to place and situation but required Mod verbal cues for problem solving while utilizing the calendar for orientation to date. Patient participated in a basic sorting task but required Mod verbal cues for problem solving and Max A verbal cues for sustained attention and initiation. Patient with intermittent language of confusion throughout. Overall, patient requires more than a reasonable amount of time to perform all tasks. Recommend repeat MBS tomorrow to assess swallow function as patient consumed nectar-thick liquids today without overt s/s of aspiration. Patient left upright in bed with alarm on and all needs within reach. Continue with current plan of care.      Pain No/Denies Pain   Agitated Behavior Scale: TBI Observation Details Observation  Environment: patient's room Start of observation period - Date: 02/09/21 Start of observation period - Time: 0815 End of observation period - Date: 02/09/21 End of observation period - Time: 0845 Agitated Behavior Scale (DO NOT LEAVE BLANKS) Short attention span, easy distractibility, inability to concentrate: Present to a slight degree Impulsive, impatient, low tolerance for pain or frustration: Absent Uncooperative, resistant to care, demanding: Absent Violent and/or threatening violence toward people or property: Absent Explosive and/or unpredictable anger: Absent Rocking, rubbing, moaning, or other self-stimulating behavior: Absent Pulling at tubes, restraints, etc.: Absent Wandering from treatment areas: Absent Restlessness, pacing, excessive movement: Absent Repetitive behaviors, motor, and/or verbal: Absent Rapid, loud, or excessive talking: Absent Sudden changes of mood: Absent Easily initiated or excessive crying and/or laughter: Absent Self-abusiveness, physical and/or verbal: Absent Agitated behavior scale total score: 15  Therapy/Group: Individual Therapy  Dayquan Buys, [place] 02/09/2021, 3:16 PM

## 2021-02-09 NOTE — Progress Notes (Signed)
Occupational Therapy TBI Note  Patient Details  Name: Scott Day MRN: [id number] Date of Birth: June 27, 1960  Today's Date: 02/09/2021 OT Individual Time: 0258-5277 OT Individual Time Calculation (min): 70 min    Short Term Goals: Week 3:  OT Short Term Goal 1 (Week 3): Pt will complete toileting tasks with min A OT Short Term Goal 2 (Week 3): Pt will don LB clothing with no more than mod cueing OT Short Term Goal 3 (Week 3): Pt will sustain attention to ADL task for 1 min with min cueing OT Short Term Goal 4 (Week 3): Pt will don shirt with min A  Skilled Therapeutic Interventions/Progress Updates:    Pt received in his TIS w/c with no c/o pain. Pt taken to the ADL apt for functional task focused on cognitive retraining (attention, working memory), visual scanning, and dynamic standing balance. Poor selective attention throughout task as pt looked for cups in cabinets with mod-max cueing from OT for moving on from same 2 cabinets and looking into others. CGA- (S) overall during lateral stepping as he moved around kitchen. Only min cueing required for working memory as pt was able to remember task at hand when cued to come back to it, however during second trial of activity when additional selective attention component added, pt required mod cueing. Pt able to scan to the L side of cabinets however mod cueing required to thoroughly find cabinets on L. Pt intermittently sexually inappropriate but with stern redirection/correction he was more appropriate. Pt oriented to place but not time x2 despite correction. Pt easily distracted by other items in the kitchen, requiring min cueing for redirection. Functional reaching activity with sequencing and thought organization aspect- pt requiring mod-max cueing to follow directions. He was able to spontaneously use his LUE in reaching, even to highest shelf in cabinet. Pt's attention getting worse with fatigue so several more rest breaks provided. In the  therapy gym pt completed dynamic standing balance task with BUE coordination component- throwing and catching ball at trampoline. Pt required CGA for standing balance and min cueing for repetitive task in busy environment. Pt ended session with floor item retrieval, CGA to pick up ten 8 in cones from the ground. Pt returned to his room and was left sitting up with all needs met, chair alarm set.   Therapy Documentation Precautions:  Precautions Precautions: Fall Precaution Comments: Scott Day, Lt hemiparesis Restrictions Weight Bearing Restrictions: No   Agitated Behavior Scale: TBI Observation Details Observation Environment: CIR Start of observation period - Date: 02/09/21 Start of observation period - Time: 1300 End of observation period - Date: 02/09/21 End of observation period - Time: 1315 Agitated Behavior Scale (DO NOT LEAVE BLANKS) Short attention span, easy distractibility, inability to concentrate: Present to a slight degree Impulsive, impatient, low tolerance for pain or frustration: Absent Uncooperative, resistant to care, demanding: Absent Violent and/or threatening violence toward people or property: Absent Explosive and/or unpredictable anger: Absent Rocking, rubbing, moaning, or other self-stimulating behavior: Absent Pulling at tubes, restraints, etc.: Absent Wandering from treatment areas: Absent Restlessness, pacing, excessive movement: Absent Repetitive behaviors, motor, and/or verbal: Absent Rapid, loud, or excessive talking: Absent Sudden changes of mood: Absent Easily initiated or excessive crying and/or laughter: Absent Self-abusiveness, physical and/or verbal: Absent Agitated behavior scale total score: 15   Therapy/Group: Individual Therapy  Curtis Sites 02/09/2021, 1:37 PM

## 2021-02-10 ENCOUNTER — Inpatient Hospital Stay (HOSPITAL_COMMUNITY): Payer: Medicaid Other | Admitting: Certified Registered Nurse Anesthetist

## 2021-02-19 NOTE — Progress Notes (Signed)
Occupational Therapy Discharge Note  This patient was unable to complete the inpatient rehab program due to PEA arrest; therefore did not meet their long term goals.   See CareTool for functional status details.

## 2021-02-19 NOTE — Progress Notes (Signed)
Physical Therapy Note  Patient Details  Name: Scott Day MRN: [id number] Date of Birth: 10/19/1960 Today's Date: 07-Mar-2021    Physical Therapy Discharge Note  This patient was unable to complete the inpatient rehab program due to PEA arrest; therefore did not meet their long term goals. Pt left the program at a CGA-supervision assist level for their functional mobility/ transfers. This patient is being discharged from PT services at this time.  Pt's perception of pain in the last five days was unable to answer at this time.    See CareTool for functional status details     Renny Remer L Ashaki Frosch PT, DPT  03/07/2021, 10:25 AM

## 2021-02-19 NOTE — Progress Notes (Signed)
Met with daughter and son this morning in patient's room. Provided emotional support and answered any questions that they had. Appreciate the resuscitation efforts of the code team.   Meredith Staggers, MD, [facility] 2021-03-09

## 2021-02-19 NOTE — Progress Notes (Addendum)
Patient found in room unresponsive and pulseless by CNA Mark. Compressions started immediately and Code blue initiated at [place]. Code team responded, unable to regain pulse. Time of death called 0527. Family notified by MD. Refer to code sheet  PA Dan notified

## 2021-02-19 NOTE — Progress Notes (Signed)
Speech Therapy Discharge Note  This patient was unable to complete the inpatient rehab program due to PEA arrest.   Patient was consuming Dys. 2 textures with honey-thick liquids with overall Min A for use of swallow strategies. Patient demonstrated behaviors consistent with a Rancho Level VI and required overall Mod-Max verbal cues to complete functional and familiar tasks safely.

## 2021-02-19 NOTE — Code Documentation (Signed)
  Patient Name: Scott Day   MRN: [id number]   Date of Birth/ Sex: [birth year] , male      Admission Date: 01/17/2021  Attending Provider: Meredith Staggers, MD  Primary Diagnosis: TBI (traumatic brain injury) [S06.9XAA]   Indication: Pt was in his usual state of health until this 0510am, when he was noted to be unresponsive and found to be in PEA arrest. Code blue was subsequently called. At the time of arrival on scene, ACLS protocol was underway.   Technical Description:  - CPR performance duration:  17 minutes  - Was defibrillation or cardioversion used? No   - Was external pacer placed? No  - Was patient intubated pre/post CPR? Yes   Medications Administered: Y = Yes; Blank = No Amiodarone    Atropine    Calcium    Epinephrine  Y  Lidocaine    Magnesium    Norepinephrine    Phenylephrine    Sodium bicarbonate  Y  Vasopressin    Other    Post CPR evaluation:  - Final Status - Was patient successfully resuscitated ? No   Miscellaneous Information:  - Time of death:  70 AM  - Primary team notified?  Yes  - Family Notified? Yes     Idamae Schuller, MD   02-21-2021, 5:43 AM

## 2021-02-19 NOTE — Progress Notes (Signed)
[facility] recommends eye and tissue donation, reference number [id number] spoke with [facility]

## 2021-02-19 NOTE — Progress Notes (Signed)
Inpatient Rehabilitation Care Coordinator Discharge Note   Patient Details  Name: Scott Day MRN: [id number] Date of Birth: August 30, 1960   Discharge location: Pt expired  Length of Stay: 23 days  Discharge activity level: Supervision  Home/community participation:    Patient response XK:GYJEHU Literacy - How often do you need to have someone help you when you read instructions, pamphlets, or other written material from your doctor or pharmacy?: Never  Patient response DJ:SHFWYO Isolation - How often do you feel lonely or isolated from those around you?: Rarely  Services provided included:    Financial Services:      Choices offered to/list presented to:    Follow-up services arranged:    Patient response to transportation need: Is the patient able to respond to transportation needs?: Yes In the past 12 months, has lack of transportation kept you from medical appointments or from getting medications?: No In the past 12 months, has lack of transportation kept you from meetings, work, or from getting things needed for daily living?: No   Comments (or additional information):  Patient/Family verbalized understanding of follow-up arrangements:     Individual responsible for coordination of the follow-up plan:    Confirmed correct DME delivered: Rana Snare Mar 05, 2021    Rana Snare

## 2021-02-19 NOTE — Anesthesia Procedure Notes (Signed)
Procedure Name: Intubation Date/Time: Mar 09, 2021 5:17 AM Performed by: Wynne Jury T, CRNA Pre-anesthesia Checklist: Patient identified, Emergency Drugs available, Suction available and Patient being monitored Patient Re-evaluated:Patient Re-evaluated prior to induction Oxygen Delivery Method: Ambu bag Preoxygenation: Pre-oxygenation with 100% oxygen Ventilation: Unable to mask ventilate Laryngoscope Size: Mac and 4 Grade View: Grade IV Tube type: Oral Tube size: 7.5 mm Number of attempts: 1 Airway Equipment and Method: Stylet Placement Confirmation: ETT inserted through vocal cords under direct vision, positive ETCO2 and breath sounds checked- equal and bilateral Secured at: 24 cm Tube secured with: Tape Dental Injury: Bloody posterior oropharynx

## 2021-02-19 NOTE — Plan of Care (Signed)
Patient expired this morning at 0527.  Dorthula Nettles, RN3, BSN, CBIS, [place], [facility], Inpatient Rehabilitation Office [phone] Cell [phone]
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
## 2021-02-19 DEATH — deceased

## 2021-03-10 ENCOUNTER — Telehealth: Payer: Self-pay
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
## 2021-03-11 MED FILL — Medication: Qty: 1 | Status: AC

## 2021-05-13 ENCOUNTER — Ambulatory Visit: Payer: Medicaid Other | Admitting: Adult Health

## 2022-06-01 IMAGING — DX DG CHEST 1V PORT
1 series · 1 of 1 positions shown · non-contrast
Comparison: Yesterday

CLINICAL DATA: Respiratory failure

EXAM:
PORTABLE CHEST 1 VIEW

[chest]
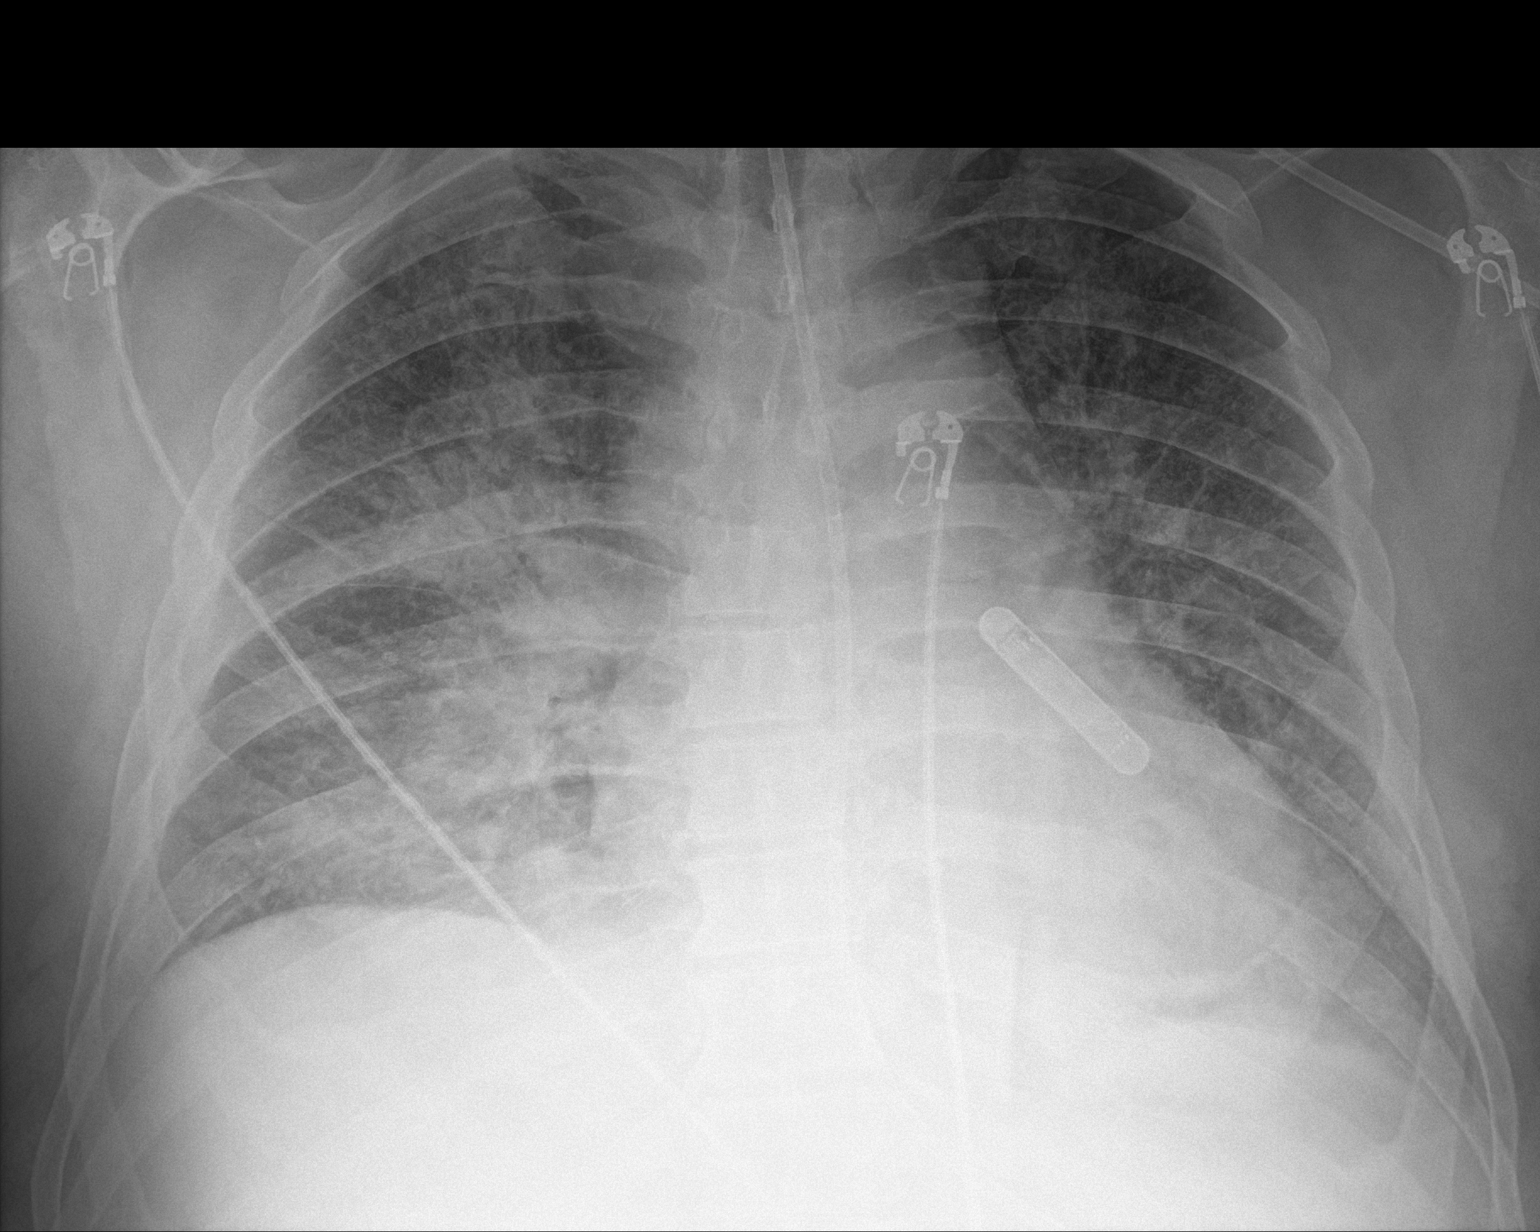

[1 of 1 positions shown; findings below may reference images not displayed]

FINDINGS: Loop recorder over the left chest. Feeding tube extends beyond the
inferior aspect of the film. Numerous leads and wires project over
the chest. Midline trachea. Mild cardiomegaly. No pneumothorax. No
definite pleural fluid. Right greater than left, relatively diffuse
interstitial and airspace disease is similar.
IMPRESSION: No significant change in right greater than left diffuse
interstitial and airspace disease. This could represent asymmetric
pulmonary edema or multifocal infection/aspiration.
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
## 2022-06-06 IMAGING — CT CT HEAD W/O CM
3 of 4 series · 13 of 47 positions shown, 15 images · non-contrast
Comparison: 01/04/2021

CLINICAL DATA: Mental status change which is persistent or
worsening.

EXAM:
CT HEAD WITHOUT CONTRAST
TECHNIQUE: Contiguous axial images were obtained from the base of the skull
through the vertex without intravenous contrast.

[Series 3: head wo · axial · 0.43mm/px · z∈[-256,-136]mm · 7 of 32 slices shown, 9 images]
[im 4/32  brain]
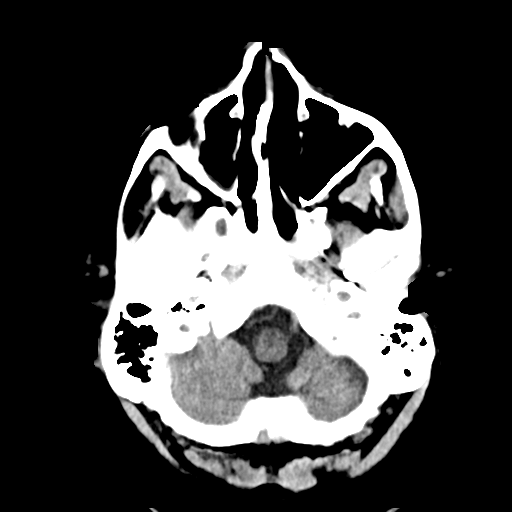
[im 4/32  bone]
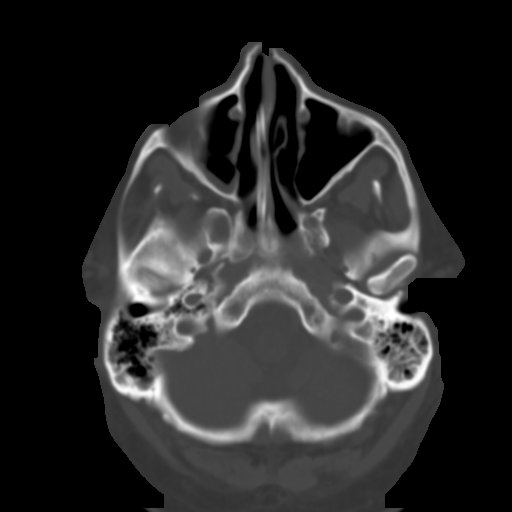
[im 8/32  brain]
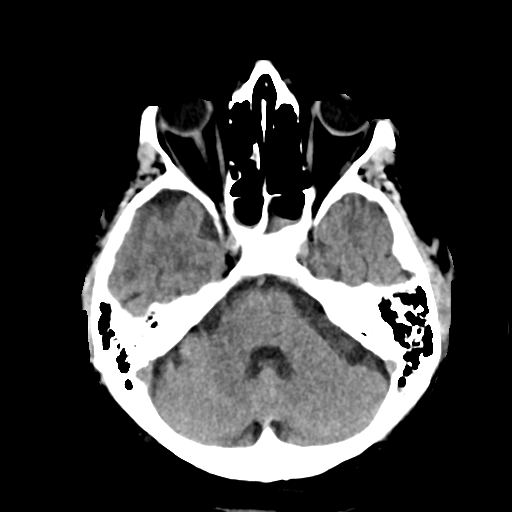
[im 12/32  brain]
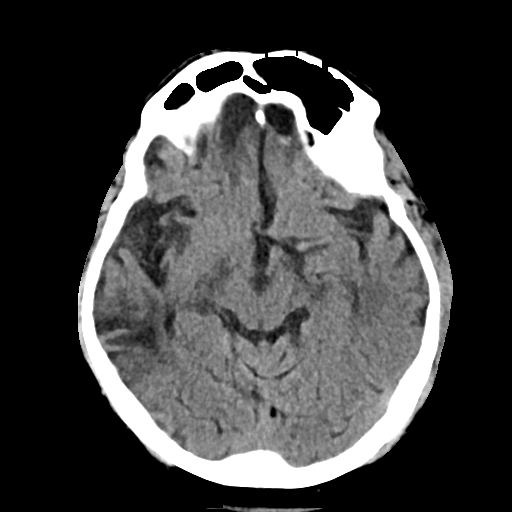
[im 16/32  brain]
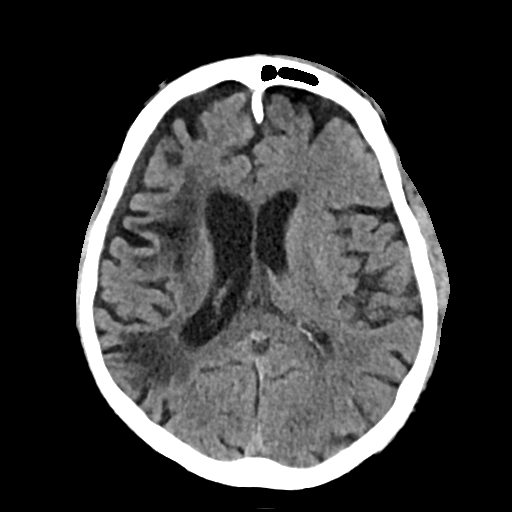
[im 20/32  brain]
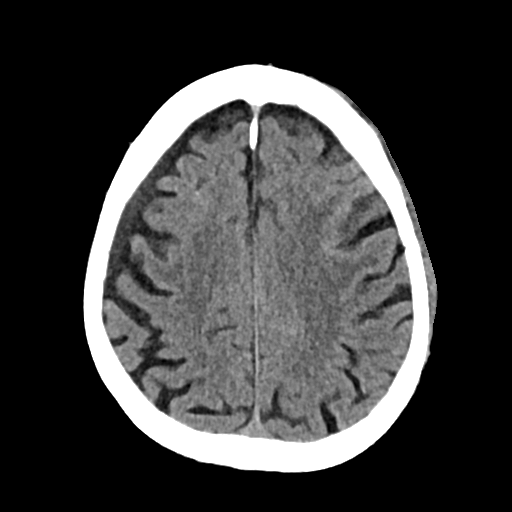
[im 20/32  bone]
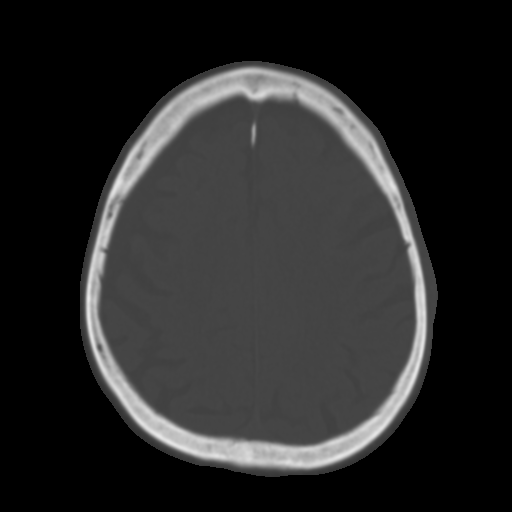
[im 24/32  brain]
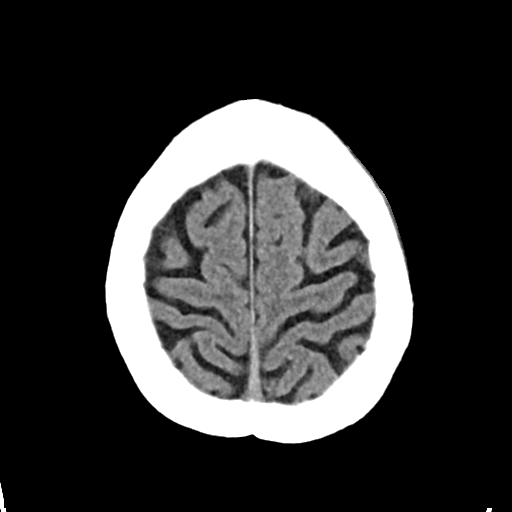
[im 28/32  brain]
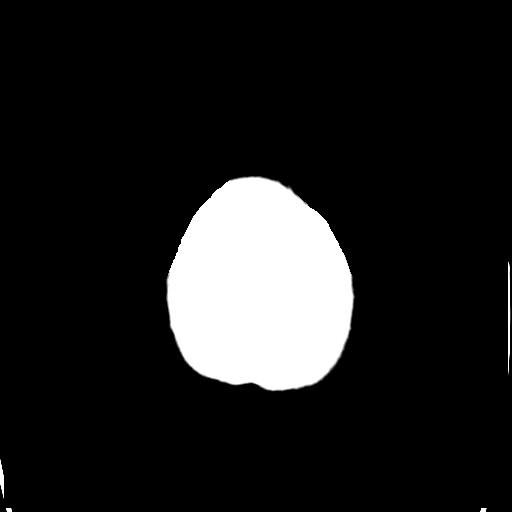

[Series 5: cor soft · coronal · 0.28mm/px · 3 of 77 slices shown]
[im 26/77  brain]
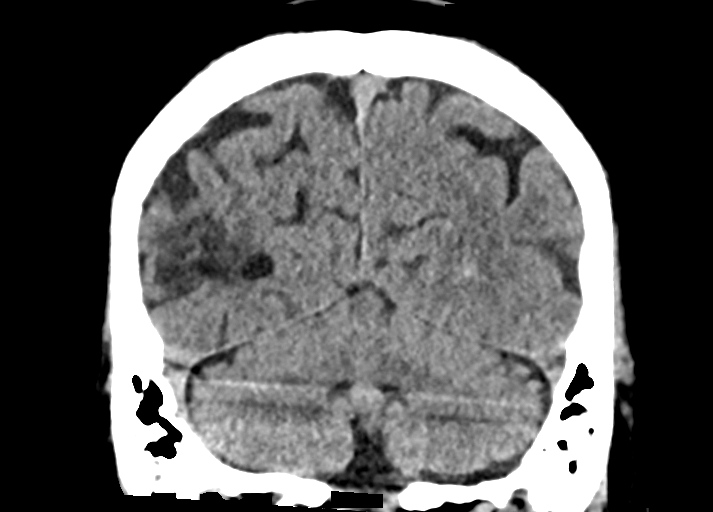
[im 34/77  brain]
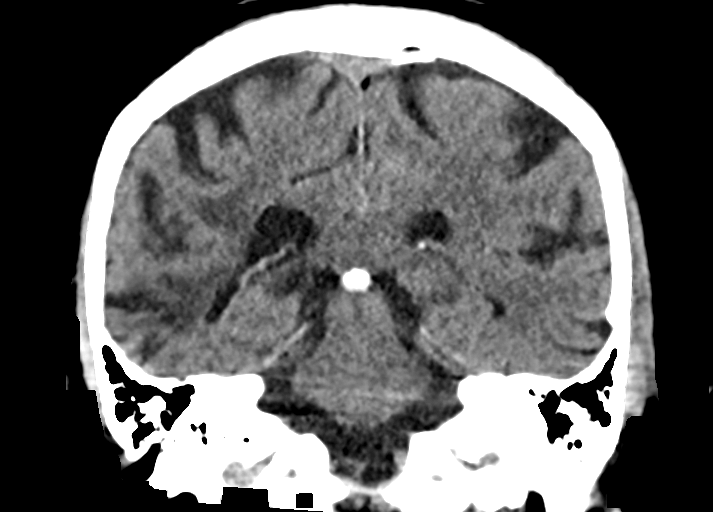
[im 43/77  brain]
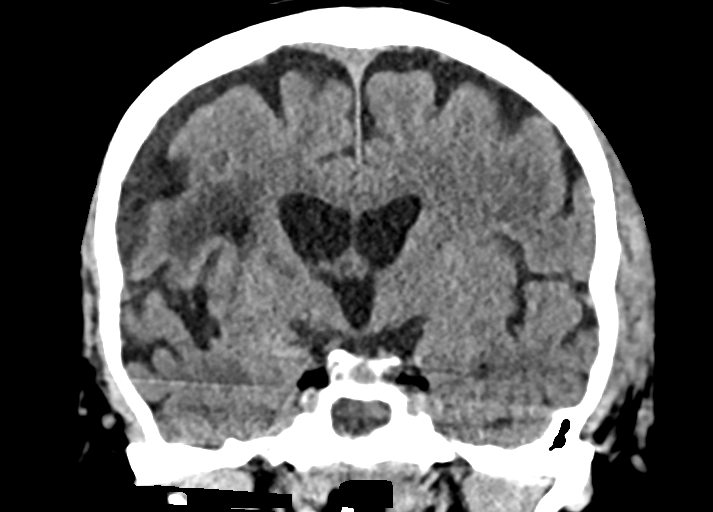

[Series 6: sag soft · sagittal · 0.28mm/px · 3 of 67 slices shown]
[im 23/67  brain]
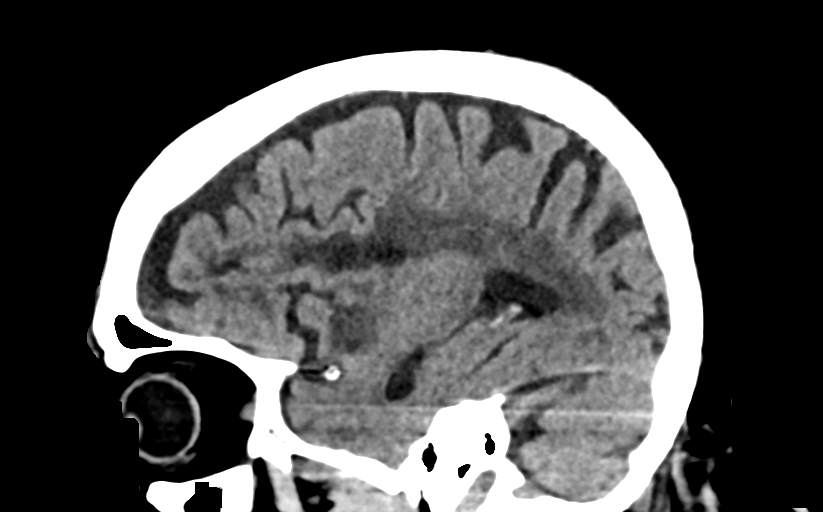
[im 34/67  brain]
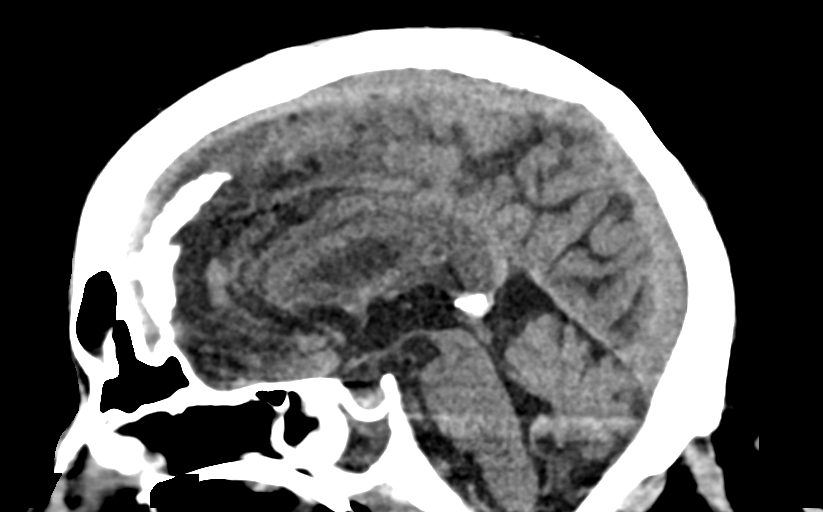
[im 45/67  brain]
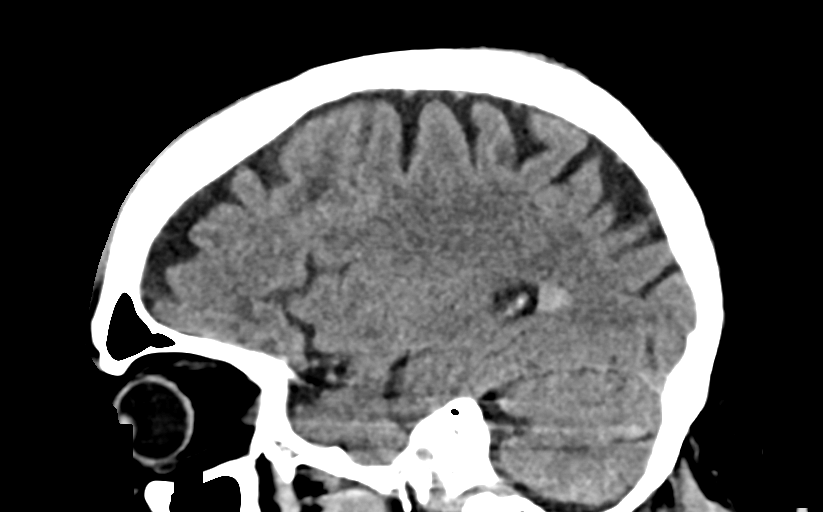

[13 of 47 positions shown; findings below may reference images not displayed]

FINDINGS: Brain: No acute insult visible today. No focal abnormality seen
affecting the brainstem or cerebellum. Previously seen
intraventricular blood has partially resolved, only a small amount
visible layering dependently in the occipital horns. Previously seen
right-sided subdural hygroma is much smaller, maximal thickness 4 mm
today compared with 1 cm previously. Ventricular size is stable. Old
post traumatic encephalomalacia of the inferior frontal lobes and
temporal tips. Old infarction in the right middle cerebral artery
territory with cortical and subcortical chronic ischemic changes. No
sign of acute or subacute infarction.

Vascular: Right MCA stent as seen previously. There is
atherosclerotic calcification of the major vessels at the base of
the brain.

Skull: No skull fracture.

Sinuses/Orbits: Subtotal opacification of both sphenoid sinuses.
Mucosal thickening of the maxillary sinuses right more than left.
Orbits negative.

Other: Some persistent but diminishing swelling of the left scalp.
Small mastoid effusions.
IMPRESSION: No new or acute finding. Diminishing right subdural hygroma, 4 mm
thick today compared with 1 cm previously. Less mass effect.
Diminishing blood dependent within the lateral ventricles. Small
amount remains visual in the occipital horns.

Chronic post traumatic changes of the inferior frontal lobes and
temporal tips.

Chronic right middle cerebral artery infarction.

Right MCA stent as seen previously.
